# Patient Record
Sex: Male | Born: 1935
Health system: Southern US, Community
[De-identification: ages and names within clinical notes are randomized; demographics above are authoritative.]

## PROBLEM LIST (undated history)

## (undated) DIAGNOSIS — R059 Cough, unspecified: Secondary | ICD-10-CM

## (undated) DIAGNOSIS — R05 Cough: Secondary | ICD-10-CM

## (undated) DIAGNOSIS — I219 Acute myocardial infarction, unspecified: Secondary | ICD-10-CM

## (undated) DIAGNOSIS — E785 Hyperlipidemia, unspecified: Secondary | ICD-10-CM

## (undated) DIAGNOSIS — E039 Hypothyroidism, unspecified: Secondary | ICD-10-CM

## (undated) DIAGNOSIS — K219 Gastro-esophageal reflux disease without esophagitis: Secondary | ICD-10-CM

## (undated) DIAGNOSIS — I480 Paroxysmal atrial fibrillation: Secondary | ICD-10-CM

## (undated) DIAGNOSIS — Z9889 Other specified postprocedural states: Secondary | ICD-10-CM

## (undated) DIAGNOSIS — Z87442 Personal history of urinary calculi: Secondary | ICD-10-CM

## (undated) DIAGNOSIS — N400 Enlarged prostate without lower urinary tract symptoms: Secondary | ICD-10-CM

## (undated) DIAGNOSIS — Z8781 Personal history of (healed) traumatic fracture: Secondary | ICD-10-CM

## (undated) DIAGNOSIS — C679 Malignant neoplasm of bladder, unspecified: Secondary | ICD-10-CM

## (undated) DIAGNOSIS — C61 Malignant neoplasm of prostate: Secondary | ICD-10-CM

## (undated) DIAGNOSIS — G4733 Obstructive sleep apnea (adult) (pediatric): Secondary | ICD-10-CM

## (undated) DIAGNOSIS — I252 Old myocardial infarction: Secondary | ICD-10-CM

## (undated) DIAGNOSIS — I2510 Atherosclerotic heart disease of native coronary artery without angina pectoris: Secondary | ICD-10-CM

## (undated) DIAGNOSIS — B029 Zoster without complications: Secondary | ICD-10-CM

## (undated) DIAGNOSIS — C649 Malignant neoplasm of unspecified kidney, except renal pelvis: Secondary | ICD-10-CM

## (undated) DIAGNOSIS — M109 Gout, unspecified: Secondary | ICD-10-CM

## (undated) DIAGNOSIS — I10 Essential (primary) hypertension: Secondary | ICD-10-CM

## (undated) DIAGNOSIS — Z9989 Dependence on other enabling machines and devices: Secondary | ICD-10-CM

## (undated) DIAGNOSIS — Z973 Presence of spectacles and contact lenses: Secondary | ICD-10-CM

## (undated) HISTORY — DX: Malignant neoplasm of unspecified kidney, except renal pelvis: C64.9

## (undated) HISTORY — PX: WISDOM TOOTH EXTRACTION: SHX21

## (undated) HISTORY — DX: Gilbert syndrome: E80.4

## (undated) HISTORY — PX: OTHER SURGICAL HISTORY: SHX169

## (undated) HISTORY — DX: Paroxysmal atrial fibrillation: I48.0

---

## 2000-02-09 DIAGNOSIS — Z8781 Personal history of (healed) traumatic fracture: Secondary | ICD-10-CM

## 2000-02-09 HISTORY — DX: Personal history of (healed) traumatic fracture: Z87.81

## 2000-09-20 ENCOUNTER — Encounter (INDEPENDENT_AMBULATORY_CARE_PROVIDER_SITE_OTHER): Payer: Self-pay | Admitting: *Deleted

## 2000-09-20 ENCOUNTER — Encounter: Payer: Self-pay | Admitting: Urology

## 2000-09-20 ENCOUNTER — Ambulatory Visit (HOSPITAL_COMMUNITY): Admission: RE | Admit: 2000-09-20 | Discharge: 2000-09-20 | Payer: Self-pay | Admitting: Urology

## 2003-02-09 HISTORY — PX: OTHER SURGICAL HISTORY: SHX169

## 2003-06-10 ENCOUNTER — Inpatient Hospital Stay (HOSPITAL_COMMUNITY): Admission: EM | Admit: 2003-06-10 | Discharge: 2003-06-13 | Payer: Self-pay | Admitting: Emergency Medicine

## 2003-09-03 ENCOUNTER — Ambulatory Visit (HOSPITAL_COMMUNITY): Admission: RE | Admit: 2003-09-03 | Discharge: 2003-09-03 | Payer: Self-pay | Admitting: Gastroenterology

## 2005-09-02 ENCOUNTER — Ambulatory Visit: Payer: Self-pay | Admitting: Internal Medicine

## 2005-10-18 ENCOUNTER — Ambulatory Visit (HOSPITAL_BASED_OUTPATIENT_CLINIC_OR_DEPARTMENT_OTHER): Admission: RE | Admit: 2005-10-18 | Discharge: 2005-10-18 | Payer: Self-pay | Admitting: Internal Medicine

## 2005-10-24 ENCOUNTER — Ambulatory Visit: Payer: Self-pay | Admitting: Internal Medicine

## 2005-10-25 ENCOUNTER — Ambulatory Visit: Payer: Self-pay | Admitting: Internal Medicine

## 2005-11-26 ENCOUNTER — Ambulatory Visit: Payer: Self-pay | Admitting: Internal Medicine

## 2006-01-07 ENCOUNTER — Ambulatory Visit: Payer: Self-pay | Admitting: Internal Medicine

## 2006-01-19 ENCOUNTER — Emergency Department (HOSPITAL_COMMUNITY): Admission: EM | Admit: 2006-01-19 | Discharge: 2006-01-19 | Payer: Self-pay | Admitting: Emergency Medicine

## 2006-07-06 ENCOUNTER — Ambulatory Visit: Payer: Self-pay | Admitting: Internal Medicine

## 2008-04-29 ENCOUNTER — Encounter: Admission: RE | Admit: 2008-04-29 | Discharge: 2008-04-29 | Payer: Self-pay | Admitting: Urology

## 2008-05-01 ENCOUNTER — Ambulatory Visit (HOSPITAL_BASED_OUTPATIENT_CLINIC_OR_DEPARTMENT_OTHER): Admission: RE | Admit: 2008-05-01 | Discharge: 2008-05-01 | Payer: Self-pay | Admitting: Urology

## 2008-05-01 ENCOUNTER — Encounter (INDEPENDENT_AMBULATORY_CARE_PROVIDER_SITE_OTHER): Payer: Self-pay | Admitting: Urology

## 2008-05-15 ENCOUNTER — Encounter (INDEPENDENT_AMBULATORY_CARE_PROVIDER_SITE_OTHER): Payer: Self-pay | Admitting: Urology

## 2008-05-15 ENCOUNTER — Ambulatory Visit (HOSPITAL_COMMUNITY): Admission: RE | Admit: 2008-05-15 | Discharge: 2008-05-15 | Payer: Self-pay | Admitting: Urology

## 2009-03-31 ENCOUNTER — Encounter: Admission: RE | Admit: 2009-03-31 | Discharge: 2009-03-31 | Payer: Self-pay | Admitting: Family Medicine

## 2010-05-20 LAB — BASIC METABOLIC PANEL
CO2: 19 mEq/L (ref 19–32)
GFR calc non Af Amer: >60 mL/min (ref >60)
Glucose, Bld: 116 mg/dL — ABNORMAL HIGH (ref 70–99)
Sodium: 137 mEq/L (ref 135–145)

## 2010-05-20 LAB — HEMOGLOBIN AND HEMATOCRIT, BLOOD: Hemoglobin: 15.9 g/dL (ref 13.0–17.0)

## 2010-05-21 LAB — BASIC METABOLIC PANEL
BUN: 14 mg/dL (ref 6–23)
CO2: 28 mEq/L (ref 19–32)
Chloride: 102 mEq/L (ref 96–112)
Glucose, Bld: 114 mg/dL — ABNORMAL HIGH (ref 70–99)

## 2010-05-21 LAB — URINALYSIS, ROUTINE W REFLEX MICROSCOPIC
Leukocytes, UA: NEGATIVE
Specific Gravity, Urine: 1.017 (ref 1.005–1.030)

## 2010-05-21 LAB — CBC
HCT: 49 % (ref 39.0–52.0)
MCV: 89.7 fL (ref 78.0–100.0)
Platelets: 243 10*3/uL (ref 150–400)
RDW: 13.1 % (ref 11.5–15.5)
WBC: 7.8 10*3/uL (ref 4.0–10.5)

## 2010-06-23 NOTE — Assessment & Plan Note (Signed)
[facility]                             PULMONARY OFFICE NOTE   Charles Wright, Charles Wright                         MRN:          [id number]  DATE:07/06/2006                            DOB:          1935/03/31    PROBLEM:  Obstructive sleep apnea.   HISTORY:  He has decided that it is time to quit CPAP. He feels he has  given it a good try and the problem remains that he really just cannot  tolerate anything on his face. Pressure adjustments have not helped. We  discussed treatment options again. He is not willing to consider  surgery, but is willing to look into an oral appliance, and I talked  about how those are used in this circumstance.   MEDICATIONS:  1. CPAP most recently 13 CWP.  2. EpiPen for p.r.n. use.   ALLERGIES:  No medication allergy.   OBJECTIVE:  Weight 213 pounds, blood pressure 136/88, pulse 67, room air  saturation 96%. He is overweight and expresses intention to lose weight  as part of his sleep apnea therapy. He is alert. There are no pressure  marks on his face from the mask and no evident nasal obstruction, no  strider. Lungs are clear. Heart sounds are regular and normal. There is  no tremor.   IMPRESSION:  Obstructive sleep apnea. He has failed to tolerate CPAP and  is not prepared to keep working with this modality for now.   PLAN:  1. Discontinue CPAP.  2. Emphasis on weight loss.  3. Try conservative measures including chin strap.  4. Offer referral for evaluation of oral appliance.  5. Schedule return to me in one year, earlier p.r.n.     Clinton D. Maple Hudson, MD, [facility], FACP  Electronically Signed    CDY/MedQ  DD: 07/10/2006  DT: 07/11/2006  Job #: [id number]   cc:   [name], M.D.

## 2010-06-23 NOTE — Op Note (Signed)
NAMEAXTON, CIHLAR NO.:  [id number]   MEDICAL RECORD NO.:  [id number]          PATIENT TYPE:  AMB   LOCATION:  DAY                          FACILITY:  [facility]   PHYSICIAN:  Courtney Paris, M.D.DATE OF BIRTH:  [birth year]   DATE OF PROCEDURE:  05/15/2008  DATE OF DISCHARGE:                               OPERATIVE REPORT   PREOPERATIVE DIAGNOSES:  Left ureteral stricture, microscopic hematuria,  atypical cytology.   POSTOPERATIVE DIAGNOSES:  Left ureteral stricture, microscopic  hematuria, atypical cytology, bladder lesion.   PROCEDURE:  Cystoscopy, right retrograde pyelogram, multiple bladder  biopsies, left ureteral stricture dilation and ureteroscopy with  insertion of left ureteral stent.   ANESTHESIA:  General.   SURGEON:  Courtney Paris, M.D.   BRIEF HISTORY:  This 75 year old patient was admitted with findings of a  left distal ureteral stricture that was very subtle.  He had  ureteroscopy 2 weeks ago, but I could not get through the stricture.  He  had suspicious cytology for hematuria recently.  He also had some recent  left lower abdominal pain.  He had some tiny stones in his left kidney  and one in the bladder when I did the ureteroscopy 2 weeks ago.  CT done  May 06, 2008 showed no periureteral inflammation on the left and no  hydronephrosis.  He is admitted now for right retrograde pyelogram and  attempt at ureteroscopy on the left after an indwelling ureteral stent  for 2 weeks.  He had a negative cysto September 2009 as well.   DESCRIPTION OF PROCEDURE:  The patient was placed on the operating room  table in the dorsal lithotomy position after satisfactory induction of  general endotracheal anesthesia.  Time-out was then performed and the  patient and the procedure were then reconfirmed.  He had Cipro running  IV.  The panendoscope was passed under direct vision.  Anterior urethra  was normal, a little trilobar hyperplasia and  the bladder was entered.  Bladder was carefully inspected.  There were a couple of small areas of  mild inflammation and hyperemia which were photographed.  He had a  little squamous metaplasia in front of the right ureteral orifice which  was also photographed.  A small stone was present in the bladder as well  which was also photographed.  A 6 open-ended ureteral catheter was then  inserted in the right ureteral orifice and inclusive retrograde  demonstrated normal findings.  The ureter had no filling defects and the  kidney looked normal as well.  He did not have a corresponding area of  narrowing over the vessels on the right side as he did on the stricture  on the left.   The areas of the bladder were then biopsied.  There were four; the  posterior base and then one on the left lateral wall.  Each of these  were fulgurated with the Bugbee electrode to effect good hemostasis.  Next the end of the ureteral stent was grasped with forceps and pulled  outside the urethral meatus.  Through this a guidewire was  then passed  under fluoroscopy and the stent was then removed.  I tried to pass the 6  short ureteroscope alongside the guidewire up to the stricture, but the  visualization was not good.  We switched out the scope and the camera,  but neither one showed very clear visualization.  I felt that this was  necessary to see this accurately, so I had the digital flexible  ureteroscope brought into the room and a second sensor guidewire was  then passed up the left orifice.  I dilated the left orifice with the  inner cannula for the ureteral access sheath for the digital scope up to  the level of the stricture.  When I passed the digital scope over the  guidewire the sensor guidewire was unable to get up past the stricture.  I took off the scope and then passed over the sensor guidewire a UroMax  balloon dilator 15 cm.  This was passed up to the stricture and then  through the stricture.   This was inflated to 12 atmospheres for 5 timed  minutes.  This was done under fluoroscopy.  The ureteral balloon dilator  was then removed and again I could not pass the flexible scope through  the stricture, so I tried again with the short ureteroscope.  With this  I was able to get through the stricture, but again visualization was not  good after the dilation and the multiple instrumentations and wires up  the ureter, but I was able to get more than halfway through the  stricture and it looked benign from what I could see.  I pulled out the  scope and then over the remaining guidewire placed a 7-French x 24 cm  length double-J ureteral stent, removed the guidewire and the coil in  the renal pelvis and the bladder where in good position.  Bladder was  then drained, B and O suppository inserted, given intraurethral  Xylocaine anesthesia and 30 mg of Toradol IV.  He was taken to the  recovery room in good condition.   The plan is to leave the stent for a week, remove it and await the  bladder biopsies.      Courtney Paris, M.D.  Electronically Signed     HMK/MEDQ  D:  05/15/2008  T:  05/15/2008  Job:  [id number]

## 2010-06-23 NOTE — Op Note (Signed)
NAMERUFINO, STAUP NO.:  [id number]   MEDICAL RECORD NO.:  [id number]          PATIENT TYPE:  AMB   LOCATION:  [facility]                         FACILITY:  [facility]   PHYSICIAN:  Houston M. Kimbrough, M.D.DATE OF BIRTH:  09-Feb-1935   DATE OF PROCEDURE:  DATE OF DISCHARGE:                               OPERATIVE REPORT   PREOPERATIVE DIAGNOSIS:  Hematuria, atypical cytology, left lower  quadrant pain.   POSTOPERATIVE DIAGNOSIS:  Hematuria, atypical cytology, left lower  quadrant pain, plus possible stone in the bladder, left distal ureteral  stricture.   PROCEDURE:  Cystoscopy, left retrograde pyelogram, ureteroscopy and  brush biopsy, insertion of left ureteral stent, fluoroscopy.   SURGEON:  Courtney Paris, M.D.   ANESTHESIA:  General.   BRIEF HISTORY:  This 75 year old patient is admitted for persistent  microscopic hematuria and atypical cytology for cysto and retrogrades.  He had a normal cysto, September2009, because of atypical cytology.  This was normal.  PSA is 1.5.  He has had 1-2 weeks of left-groin pain.  CT without contrast on April 29, 2008 showed some fullness of the left  distal ureter but no hydronephrosis, no stones.  He had a tiny left mid-  renal calculus that was nonobstructing.  There was no periureteral  inflammation noted.  Patient is admitted now for cystoscopy and  retrogrades and possible biopsies, if indicated.   The patient was placed on the operating table and after satisfactory  induction of general anesthesia, was prepped and draped with Betadine in  the usual sterile fashion.  He was given IV Cipro.  Time-out was then  performed and the patient and procedure were then re-confirmed.   The anterior urethra was inspected under direct vision.  This was  normal.  The posterior urethra showed somewhat friable prostate,  trilobar hyperplasia, and the bladder was entered.  There were no  bladder mucosal lesions, but it looked  like there was a tiny stone  floating in the bladder.  I tried to grab it with the grasping forceps  but was unable to do so.  No other lesions were seen.  The left orifice  was catheterized with a 6 open-ended ureteral catheter and an occlusive  retrograde was then performed.  This demonstrated no hydronephrosis, but  a somewhat ectatic and dilated ureter to just below the pelvic brim.  This was about the area where the middle and lower third of the ureter  would join.  It was an area that looked somewhat narrowed and never did  distend with dye.  Several pictures were made.   Under fluoroscopy, I passed a sensor guidewire and it met a little bit  of resistance but did go up to the kidney.  I removed the open-ended  ureteral catheter and the scope.  I then used the inner cannula of the  ureteral access sheath to gently dilate the distal left ureter under  fluoroscopy.  Leaving the guidewire in place, I used the 6 short  ureteroscope and passed this up to this area, but could not get through  it.  It seemed to be tight.  I took pictures of the stricture.  There  was nothing particularly remarkable about the mucosa except it was a  little bit friable.  Under direct vision, using the ureteroscope, I then  passed a brush and brushed this area and sent the brush for cytology.   I removed the ureteroscope and then back-loaded the wire on the  cystoscope and then passed a 6-French x 24 cm double-J catheter on the  left.  When the guidewire was removed, there was a nice coil in the  renal pelvis, one in the bladder.  This was adjusted slightly with  grasping forceps.  His prostate was a little oozy.  There were a few  small blood clots and, when I irrigated these out, it was seen to be a  small, black, hard specimen that was remaining.  It was certainly gritty  and I sent it to see if it was a possible stone.  Scope was removed.  A  B and O suppository was given, as well as some Toradol, and he  was taken  to the recovery room in good condition.   PLAN:  The plan is to leave the indwelling ureteral stent for least a  week and then go back with perhaps a flexible ureteroscope to see if  this area can be further visualized and/or biopsied.      Courtney Paris, M.D.  Electronically Signed     HMK/MEDQ  D:  05/01/2008  T:  05/01/2008  Job:  [id number]

## 2010-06-26 NOTE — Assessment & Plan Note (Signed)
[name]                               PULMONARY OFFICE NOTE   Charles, Wright                         MRN:          [id number]  DATE:09/02/2005                            DOB:          06-19-1935    PROBLEM:  Sleep medicine consultation at the kind request of Dr. Kirby Funk for evaluation of suspected obstructive sleep apnea.   HISTORY:  Charles Wright feels he is here primarily at the instigation of his  wife.  She has told him that he stops breathing and snores loudly.  He  drives a lot saying that he will stop anytime he needs to for a quick  restorative nap, that he can fall asleep anytime, but he does not regard  this is as a problem and does not feel that it gets in his way.  His usual  bedtime is between 11 and 12 a.m., estimating 5 minutes sleep latency and  awakening once during the night before final waking around 6 a.m.  He has  not been using sleep medications.   REVIEW OF SYSTEMS:  Snoring with witnessed apneas.  Sleeping in the daytime  only if he sits quietly.  He does take naps when needed.  No nocturnal  choking or strangling.  Occasional mild nonspecific nasal congestion.  He  has found that it helps to wear a Breathe-Rite strip and to use a saline  nose spray.  Weight is very stable.  He denies any awareness of chest pain,  palpitations, leg jerks, or unusual sleep associated behaviors, otherwise.   MEDICATIONS:  Tetracycline used p.r.n. for rosacea, Sulindac.   ALLERGIES:  No medication allergies.   PAST HISTORY:  No history of cardiopulmonary disease.  He had a fractured  nose playing high school football which was not formally straightened.  He  broke his leg in an awkward fall and he also had a compression fracture of  his lumbar spine.  His tonsils were taken out in early childhood.  No  history of thyroid disease, central nervous system disease, or other  significant health problems.   SOCIAL HISTORY:  Two glasses  of red wine per day, two mugs of coffee in the  morning, sometimes additional coffee when driving.  He is married and works  as a Hospital doctor of a Librarian, academic.   FAMILY HISTORY:  Nobody with known sleep problems.  Father died of heart  disease.  Mother died of leukemia.   PHYSICAL EXAMINATION:  VITAL SIGNS:  Weight 208 pounds, blood pressure 132/74, pulse regular at 73,  room air saturation 96%.  GENERAL:  This is a sturdy alert man, a little overweight (he is trying to  diet and exercise to bring that down).  HEENT:  Speech quality is normal.  Nasopharynx is clear with posterior  pharynx minimally glandular, no drainage seen.  His nasal airway is not  obstructed.  LUNGS:  Clear to P&A.  HEART:  Sounds are regular without murmur or gallop.  There is no restlessness or unusual behavior.   IMPRESSION:  Probable obstructive sleep apnea with hypersomnia.   PLAN:  1.  We are scheduling a split night protocol sleep study.  2.  We have discussed the physiology and medical concerns associated with      sleep apnea, his responsibility to drive safely, and advised to try to      lose a few more pounds if possible.   I appreciate the chance to meet him.                                   Clinton D. Maple Hudson, MD, [facility], FACP   CDY/MedQ  DD:  09/02/2005  DT:  09/02/2005  Job #:  [id number]   cc:   [name], MD  [facility]

## 2010-06-26 NOTE — Procedures (Signed)
NAMEFORREST, JAROSZEWSKI NO.:  [id number]   MEDICAL RECORD NO.:  [id number]          PATIENT TYPE:  OUT   LOCATION:  SLEEP CENTER                 FACILITY:  [facility]   PHYSICIAN:  Clinton D. Maple Hudson, MD, FCCP, FACPDATE OF BIRTH:  03-06-1935   DATE OF STUDY:  10/18/2005                              NOCTURNAL POLYSOMNOGRAM   REFERRING PHYSICIAN:  Dr. Jetty Duhamel   INDICATIONS FOR STUDY:  Hypersomnia with sleep apnea.   EPWORTH SLEEPINESS SCORE:  6/24.   BMI 30, weight 200 pounds.   HOME MEDICATIONS:  Tetracycline/Sulindac.   SLEEP ARCHITECTURE:  Total sleep time 316 minutes with sleep efficiency 76%.  Stage I was 20%, stage II 68%, stages III and IV were absent.  REM 12% of  total sleep time.  Sleep latency 25 minutes.  REM latency 68 minutes.  Awake  after sleep onset 73 minutes.  Arousal index 29.2.  No bedtime medication  was taken.   RESPIRATORY DATA:  Split study protocol.  Apnea/hypopnea index (AHI, RDI)  15.6 obstructive events per hour indicating mild to moderate obstructive  sleep apnea/hypopnea syndrome before CPAP.  This included 10 obstructive  apneas and 27 hypopneas before CPAP.  The events were not positional.  REM  AHI 4.9 per hour.  CPAP was titrated to 13 CWP, AHI 5.3 per hour.  Several  masks were tried, and the patient was never entirely comfortable with any of  those used.  He settled on a medium ResMed Quattro full-face mask for this  study, with a heated humidifier.   OXYGEN DATA:  Mild to moderate snoring with oxygen desaturation to a nadir  of 83%.  Mean oxygen saturation with CPAP control was 92% on room air.   CARDIAC DATA:  Sinus rhythm with occasional PAC.   MOVEMENT/PARASOMNIA:  A total of 154 limb jerks were recorded, of which 44  were associated with arousal or awakening, for a periodic limb movement with  arousal index of 8.3 per hour, which is increased.   IMPRESSION/RECOMMENDATION:  1. Mild to moderate obstructive sleep  apnea/hypopnea syndrome, AHI 15.6      per hour with moderate snoring and oxygen desaturation to 83%.  2. Successful CPAP titration to 13 CWP, AHI 5.3 per hour.  A medium ResMed      Quattro full-face mask was used for this study with a heated      humidifier.  He indicated he was not entirely satisfied with that mask,      and it is likely that his home care company will have to work with him      on alternatives.  3. Periodic limb movement with arousal, 8.3 per hour, which may need      reconsideration after he has adjusted to CPAP.      Clinton D. Maple Hudson, MD, [facility], FACP  Diplomate, Biomedical engineer of Sleep Medicine  Electronically Signed     CDY/MEDQ  D:  10/24/2005 13:05:44  T:  10/25/2005 12:03:40  Job:  [id number]

## 2010-06-26 NOTE — Discharge Summary (Signed)
NAMEMarland Wright  BRONSYN, SHAPPELL NO.:  [id number]   MEDICAL RECORD NO.:  [id number]                   PATIENT TYPE:  INP   LOCATION:  0453                                 [facility]ACILITY:  [facility]   PHYSICIAN:  Myrtie Neither, M.D.                 DATE OF BIRTH:  1935-11-27   DATE OF ADMISSION:  06/10/2003  DATE OF DISCHARGE:  06/13/2003                                 DISCHARGE SUMMARY   ADMITTING DIAGNOSIS:  Fracture dislocation left ankle with fracture of the  lateral malleolus and complete rupture of the deltoid ligament.   DISCHARGE DIAGNOSIS:  Fracture dislocation left ankle with fracture of the  lateral malleolus and complete rupture of the deltoid ligament.   COMPLICATIONS:  None.   INFECTIONS:  None.   OPERATIONS:  1. Open reduction internal fixation lateral malleolus.  2. Repair of deltoid ligament with application of short-leg cast.   PERTINENT HISTORY:  This is a 75 year old male who was at work and  accidentally slipped down an embankment on some slippery grass and developed  severe pain, swelling, and deformity of his left ankle.  The patient came to  [facility] emergency room for treatment.  The patient denies any other  injuries.   PAST MEDICAL HISTORY:  Bilateral inguinal hernia repair.  No history of high  blood pressure or diabetes.   ALLERGIES:  None known.   MEDICATIONS:  None.   HABITS AND SOCIAL HISTORY:  The patient lives with his wife.  Drinks wine at  bedtime and during the day.  No use of cigarettes or illegal drugs.   FAMILY HISTORY:  Noncontributory.   REVIEW OF SYSTEMS:  Basically, the patient being in good health.  No cardiac  or respiratory, no urinary or bowel symptoms.   PERTINENT PHYSICAL EXAMINATION:  The left ankle laterally subluxed, totally  unstable, tender, swollen.  Mild ecchymosis over the medial aspect of the  ankle joint where the deltoid is.  Palpable defect at the deltoid ligament  area.  Pressure over the  skin of the medial malleolus from the subluxed  ankle joint.  X-rays revealed fracture dislocation left ankle with complete  disruption of the deltoid ligament.   IMPRESSION:  Fracture dislocation left ankle with disruption deltoid  ligament.   PLAN:  Open reduction internal fixation left ankle and repair of deltoid  ligament with application of short-leg cast.   HOSPITAL COURSE:  The patient underwent ORIF of his ankle and repair of the  deltoid ligament, application of short-leg cast.  He tolerated the procedure  quite well, started on ice packs, elevation, PCA pain medication, and  Percocet also orally.  The patient remained afebrile, tolerated physical  therapy quite well, and was able to be stable enough to be discharged.  Discharged on Percocet one to two p.o. q.4h. p.r.n. for pain, Clinoril 200  mg b.i.d., Skelaxin 800 mg b.i.d., and  to return to the office in 2 weeks.  The patient was discharged in stable and satisfactory condition.                                               Myrtie Neither, M.D.    AC/MEDQ  D:  06/18/2003  T:  06/18/2003  Job:  [id number]   cc:   [name], M.D.  [address]  [place]  [place] [address]  Fax: [phone]

## 2010-06-26 NOTE — Assessment & Plan Note (Signed)
[facility]                             PULMONARY OFFICE NOTE   REA, RESER                         MRN:          [id number]  DATE:01/07/2006                            DOB:          [birth year]    PROBLEM:  Obstructive sleep apnea.   HISTORY:  He continues struggling to make himself comfortable with CPAP.  We reduced the pressure from 14 to 12 CWP, and he says this is better  but does not feel further reduction is appropriate at this point after  our discussion.  He is trying to keep it on for 2 to 3 hours at night if  he can.  We reviewed factors associated with management of sleep apnea,  and recognize his gradual weight drift upward.  He plans to lose weight.  He is using Breathe-Right strips, and we discussed oral appliances, chin  straps, etc.  CPAP is currently set at 12.   OBJECTIVE:  Weight is up to 215 pounds, BP 122/82, pulse regular 68,  room air saturation 98%.  He is quite alert.  There are no pressure marks on his face from the mask and no evident  nasal congestion.  Voice quality is normal.  Breathing is unlabored.  Pulse regular.   IMPRESSION:  Obstructive sleep apnea, just continuing to struggle with  continuous positive airway pressure toleration.   PLAN:  1. Continue efforts with CPAP, but he is offered referral for oral      appliance.  He is going to try an over-the-counter chin strap and      work on his weight.  2. Schedule return in 6 months, earlier p.r.n.     Clinton D. Maple Hudson, MD, [facility], FACP  Electronically Signed    CDY/MedQ  DD: 01/07/2006  DT: 01/08/2006  Job #: [id number]   cc:   [name], M.D.

## 2010-06-26 NOTE — H&P (Signed)
NAMEMarland Kitchen  ZAID, TOMES NO.:  [id number]   MEDICAL RECORD NO.:  [id number]                   PATIENT TYPE:  INP   LOCATION:  0453                                 FACILITY:  [facility]   PHYSICIAN:  Myrtie Neither, M.D.                 DATE OF BIRTH:  [birth year]   DATE OF ADMISSION:  06/10/2003  DATE OF DISCHARGE:                                HISTORY & PHYSICAL   CHIEF COMPLAINT:  Painful deformed left ankle.   HISTORY OF PRESENT ILLNESS:  This is a 75 year old who states that he fell  down an embankment on some slippery grass this morning and developed severe  pain, swelling, and deformity of the left ankle. The patient came to [facility] emergency room for treatment. The patient denies any other injury or  loss of consciousness.   PAST MEDICAL HISTORY:  No history of high blood pressure or diabetes.   PAST SURGICAL HISTORY:  Bilateral hernia repair.   HABITS:  The patient takes a sip of wine at night.  No history of smoking.  The patient lives with his wife.   ALLERGIES:  None known.   MEDICATIONS:  None.   FAMILY HISTORY:  Noncontributory.   REVIEW OF SYSTEMS:  Some history of reflux. No shortness of breath. No chest  pains.  No urinary or bowel symptoms.   PHYSICAL EXAMINATION:  GENERAL: Alert and oriented, in no acute distress.  VITAL SIGNS: Temperature 97, blood pressure 145/93, pulse 81, respirations  20.  HEENT: Head normocephalic. Eyes show TMs and sclerae clear.  NECK: Supple.  CHEST: Clear.  CARDIAC: S1 and S2.  EXTREMITIES: Left ankle with gross deformity, swelling diffusely, ecchymosis  over the medial aspect of the ankle joint with obvious unstable ankle joint.  Dorsalis pedis intact. Nail beds pink and blanches quite well. There is a  pressure area over the medial aspect of the ankle joint from the bone  pressing against the inside of the skin.   X-rays reveal fracture, subluxation of the left ankle with obvious  destruction  of the deltoid ligament and fracture of the lateral malleolus.   PLAN:  Open reduction, internal fixation lateral malleolus and repair of  deltoid ligament.                                              Myrtie Neither, M.D.   AC/MEDQ  D:  06/10/2003  T:  06/10/2003  Job:  [id number]

## 2010-06-26 NOTE — Assessment & Plan Note (Signed)
[facility]                               PULMONARY OFFICE NOTE   Charles Wright, Charles Wright                         MRN:          [id number]  DATE:11/26/2005                            DOB:          Aug 12, 1935    PROBLEM:  Obstructive sleep apnea.   HISTORY:  Charles Wright continues to struggle with CPAP, trying to get  comfortable with it.  Pressure seems too high set at 14 CWP with C-FLEX.  He  has tried multiple masks and thinks if there is one problem, it is that the  pressure is too high.  If he takes the mask off, his wife wakes because of  his snoring, but she is using a white noise device.  He had hurt his leg a  while back and has noted a little contralateral edema which he is going to  track with Dr. Valentina Lucks.  I have recommended elevation at a minimum.   MEDICATIONS:  CPAP at 14 CWP.   ALLERGIES:  No known drug allergies.   OBJECTIVE:  VITAL SIGNS:  Weight 212 pounds, blood pressure 130/82, pulse  regular at 72, room air saturation 95%.  He is still somewhat heavy.  GENERAL:  He is alert with no pressure marks on his face.  There is trace  edema in the right lower leg without obvious Homan's.   IMPRESSION:  Obstructive sleep apnea with difficulty adjusting to CPAP.  Leg  edema to follow up with Dr. Valentina Lucks.   PLAN:  [facility] is to reduce CPAP to 12 CWP with comfort measures  reviewed, weight loss emphasized, alternatives reviewed.  Schedule return in  6 weeks, earlier p.r.n.       Clinton D. Maple Hudson, MD, [facility], FACP      CDY/MedQ  DD:  11/26/2005  DT:  11/29/2005  Job #:  [id number]   cc:   [name], M.D.

## 2010-06-26 NOTE — Op Note (Signed)
NAMEMarland Kitchen  BENJIMIN, HADDEN NO.:  [id number]   MEDICAL RECORD NO.:  [id number]                   PATIENT TYPE:  INP   LOCATION:  0453                                 FACILITY:  [facility]   PHYSICIAN:  Myrtie Neither, M.D.                 DATE OF BIRTH:  [birth year]   DATE OF PROCEDURE:  06/10/2003  DATE OF DISCHARGE:                                 OPERATIVE REPORT   PREOPERATIVE DIAGNOSIS:  Fractured lateral malleolus with rupture of the  deltoid ligament and subluxation of the ankle joint.   POSTOPERATIVE DIAGNOSIS:  Fractured lateral malleolus with rupture of the  deltoid ligament and subluxation of the ankle joint.   ANESTHESIA:  General.   PROCEDURES:  1. Open reduction, internal fixation lateral malleolus.  2. Repair of deltoid ligament, left ankle.   The patient was taken to the operating room. After given adequate  preoperative medication, given general anesthesia, and intubated, the left  lower leg was prepped with Duraprep and draped in a sterile manner. A  tourniquet and Bovie were used for hemostasis. C-arm was used to visualize  fracture reduction. A medial Kocher incision was made over the medial aspect  of the ankle, going through the skin, subcutaneous tissue, down through the  hematoma, and down to the location of the completely disrupted deltoid  ligaments and veins. A #2 wire suture was placed into the deltoid ligament.  The area was irrigated and the joint was irrigated with saline solution.  Sutures were placed and clamped with hemostats.  Next, a lateral incision  was made over the lateral malleolus, going through the skin and subcutaneous  tissue down to the fracture site. Irrigation was done. Fracture reduction  was done.  A compression plate was placed across the fracture site holding  the fracture in anatomic position. Four screws were placed across the  fracture site holding it in stable position. Wound closure was then done  with  2-0 Vicryl for the subcutaneous and skin staples to the skin. Next,  attention was turned back to the medial aspect of the ankle joint and the  sutures of the deltoid were then tied and cut.  Irrigation was done. The  ankle itself was good and stable. Then 2-0 Vicryl was used to the  subcutaneous tissue and skin staples for the skin. A compressive bulky  dressing was applied and a short-leg fiberglass cast was applied. The  patient tolerated the procedure quite well and went to the recovery room in  stable and satisfactory condition.                                               Myrtie Neither, M.D.    AC/MEDQ  D:  06/10/2003  T:  06/10/2003  Job:  [id number]

## 2010-06-26 NOTE — Assessment & Plan Note (Signed)
[facility]                               PULMONARY OFFICE NOTE   JOSIMAR, CORNING                         MRN:          [id number]  DATE:10/25/2005                            DOB:          1935/07/13    PROBLEM:  Obstructive sleep apnea.   HISTORY:  [name] returns after his sleep study done October 18, 2005.  This showed moderate obstructive sleep apnea with index of 15.6 obstructive  events per hour, desaturating to 83% with mild to moderate snoring. He had  difficulty getting comfortable with any of the available masks.  CPAP was  titrated to 13 CWP for an index of 5.3 per hour.  Today we discussed comfort  measures, available treatments for sleep apnea and the medical concerns of  this diagnosis.  I emphasized the importance of keeping his weight down and  his responsibility to drive safely, the advantages of sleeping off flat of  back.   OBJECTIVE:  Weight 209 pounds, blood pressure 124/72, pulse regular 59, room  air oxygen saturation 93%.  He is alert and appropriate now.  His nasal  bridge is somewhat prominent which may interfere with fit.  He is a little  heavy.  There is no nasal obstruction.  Lungs are clear.  Heart sounds are  normal.   IMPRESSION:  Obstructive sleep apnea which probably will respond best to  CPAP if we can get him comfortable with it.   PLAN:  1. Emphasis on weight loss.  2. CPAP trial at 13 to 14 CWP.  3. Scheduled to return in one month, earlier p.r.n.                                   Clinton D. Maple Hudson, MD, [facility], FACP   CDY/MedQ  DD:  10/27/2005  DT:  10/29/2005  Job #:  [id number]   cc:   [name], M.D.

## 2010-08-17 ENCOUNTER — Other Ambulatory Visit: Payer: Self-pay | Admitting: Dermatology

## 2011-01-12 ENCOUNTER — Other Ambulatory Visit: Payer: Self-pay | Admitting: Dermatology

## 2011-10-13 ENCOUNTER — Other Ambulatory Visit: Payer: Self-pay | Admitting: Urology

## 2011-10-17 NOTE — H&P (Signed)
History of Present Illness                       F/u h/o nephrolithiasis and h/o ureteral stricture.   He had an atypical cytology on a visit in early March 2010. He was set up for cystoscopy, bilateral retrogrades in May 2010.   He has also had some kidney stones, but none have ever been recovered.  He had his last stone in 06/2005, and his first one was around 2002 when he broke his back. He has 2 small left renal calculi seen on a CT scan done last fall 2009.   CT scan March 2010 was negative for hydronephrosis but a little dilated distal ureter was noted. No ureteral stones were seen and a small stone in his left mid kidney that was nonobstructing. On May 01, 2008 and underwent a left retrograde pyelogram with the finding of a ureteral stricture about 2-3 cm just below the pelvic brim on the left side. There was no hydronephrosis. The ureteroscope was passed up to but not through the stricture. He was stented. He had another ureteroscopy in April 2010 that showed a stricture but it had to be dilated and a scope would not advance all the way through it. Brush biopsy of this area was negative. Then a larger stent was placed. Review of the CT scan did not show any peri-ureteral involvement in this area, but the etiology of the stricture is unknown as of this time. The stent was then removed after a short time and he has done well since May 2010. He had a IVP July 2010 which was really pretty normal and showed no hydronephrosis or abnormality of the left distal ureter.   CT scan January 2011 was negative, no hydronephrosis, and the hyperechoic area of the left kidney was a benign column of Bertin. Cytology was negative July 2010.   Renal ultrasound Aug 2013 - I reviewed all the images, comparison CT January 2011 an ultrasound January 2011, findings: The right kidney is 11.5 cm in length with a 1.8 cm cortical with. There was an irregular shaped cystic area 1.19 cm in the right lower pole which is  stable. There is a 6 mm stone in the right midpole. There was no hydronephrosis or solid mass. The left kidney showed no hydronephrosis and 2 scattered hyperechoic areas possible small stones about 3 mm each. There was a round hypoechoic area 2.5 cm which compared to 2.1 cm in 2011. The bladder and then 65 mL postvoid with a small rounded 1 cm area either a bladder mass or median lobe of prostate.   PSA was 2.65 June 2012. His PSA Jul 2013 is 3.05 (PSAV  0.3 - 0.4 / year).   Interval Hx He returns for CT and cystoscopy to evaluate for microhematuria given complex h/o of ureteral stricture.   Unofficially CT A/P looks normal from a GU point of view.    Past Medical History Problems  1. History of  Abdominal Pain In The Left Lower Belly (LLQ) 789.04 2. History of  Microscopic Hematuria 599.72 3. History of  Nephrolithiasis V13.01 4. History of  Nephrolithiasis Of The Left Kidney V13.01 5. History of  Ureteral Stricture 593.3  Surgical History Problems  1. History of  Cystoscopy With Biopsy 2. History of  Cystoscopy With Insertion Of Ureteral Stent Left 3. History of  Cystoscopy With Insertion Of Ureteral Stent Left 4. History of  Cystoscopy With Ureteral Cath And Brush  Biopsy Left 5. History of  Cystoscopy With Ureteroscopy Left 6. History of  Cystourethroscopy With Treatment Of Ureteral Stricture 7. History of  Leg Repair 8. History of  Oral Surgery  Current Meds 1. EpiPen 0.3 MG/0.3ML Injection Device; Therapy: 12Jun2011 to 2. Ibuprofen 800 MG Oral Tablet; Therapy: 27Apr2012 to 3. Losartan Potassium 100 MG Oral Tablet; Therapy: 27May2011 to 4. Multi-Vitamin/Iron TABS; Therapy: (Recorded:06Jan2011) to 5. Pravastatin Sodium 40 MG Oral Tablet; Therapy: (Recorded:22Mar2010) to 6. Tetracycline HCl CAPS; Therapy: (Recorded:14Jul2011) to  Allergies Medication  1. OxyCODONE HCl TABS  Family History Problems  1. Family history of  Death In The Family Father Deceased at age 66 2.  Family history of  Death In The Family Mother Deceased at age 54  Social History Problems  1. Alcohol Use 2 daily 2. Caffeine Use 2 daily 3. Marital History - Currently Married 4. Never A Smoker 5. Occupation: Psychologist, sport and exercise Denied  6. History of  Tobacco Use V15.82  Review of Systems Constitutional, cardiovascular, pulmonary, musculoskeletal and neurological system(s) were reviewed and pertinent findings if present are noted.    Physical Exam Constitutional: Well nourished and well developed . No acute distress.  Pulmonary: No respiratory distress and normal respiratory rhythm and effort.  Cardiovascular: Heart rate and rhythm are normal . No peripheral edema.  Neuro/Psych:. Mood and affect are appropriate.    Results/Data Urine [Data Includes: Last 1 Day]   03Sep2013  COLOR YELLOW   APPEARANCE CLEAR   SPECIFIC GRAVITY 1.020   pH 6.5   GLUCOSE NEG mg/dL  BILIRUBIN NEG   KETONE NEG mg/dL  BLOOD LARGE   PROTEIN NEG mg/dL  UROBILINOGEN 0.2 mg/dL  NITRITE NEG   LEUKOCYTE ESTERASE NEG   SQUAMOUS EPITHELIAL/HPF NONE SEEN   WBC 0-2 WBC/hpf  RBC 11-20 RBC/hpf  BACTERIA NONE SEEN   CRYSTALS NONE SEEN   CASTS NONE SEEN    Procedure  Procedure: Cystoscopy   Indication: Hematuria.  Informed Consent: Risks, benefits, and potential adverse events were discussed and informed consent was obtained from the patient.  Prep: The patient was prepped with betadine.  Antibiotic prophylaxis: Ciprofloxacin.  Procedure Note:  Urethral meatus:. No abnormalities.  Anterior urethra: No abnormalities.  Prostatic urethra: No abnormalities . The lateral prostatic lobes were enlarged.  Bladder: Visulization was clear. The ureteral orifices were in the normal anatomic position bilaterally and had clear efflux of urine. A sessile tumor was seen in the bladder. This tumor was located on the right side, on the posterior aspect of the bladder. Another papillary tumor was seen in the bladder.  This tumor was located on the left side, near the trigone of the bladder. The patient tolerated the procedure well.  Complications: None.    Assessment Assessed  1. Microscopic Hematuria 599.72 2. 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 Follow-up Schedule Surgery Office  Follow-up  Done: 03Sep2013 Health Maintenance (V70.0)  2. UA With REFLEX  Done: 03Sep2013 02:10PM Microscopic Hematuria (599.72)  3. AU CT-HEMATURIA PROTOCOL  Done: 03Sep2013 12:00AM 4. AU CT-HEMATURIA PROTOCOL  Done: 03Sep2013 12:00AM  Discussion/Summary        I discussed with the patient the cystoscopic findings. We discussed the nature risks and benefits of TURBT, left ureteroscopy, possible ureteral stent on left. We discussed mitomycin-C as well but the lesions dont look papillary. All questions answered. He elects to proceed and we will hold off on mitomycin.    cc: Dr. Elias Else  Signatures Electronically signed by : Jerilee Field, M.D.; Oct 12 2011  4:25PM

## 2011-10-19 ENCOUNTER — Encounter (HOSPITAL_BASED_OUTPATIENT_CLINIC_OR_DEPARTMENT_OTHER): Payer: Self-pay | Admitting: *Deleted

## 2011-10-19 NOTE — Progress Notes (Signed)
NPO AFTER MN. ARRIVES AT 0815. NEEDS ISTAT AND EKG. 

## 2011-10-22 ENCOUNTER — Ambulatory Visit (HOSPITAL_BASED_OUTPATIENT_CLINIC_OR_DEPARTMENT_OTHER): Payer: Medicare Other | Admitting: Anesthesiology

## 2011-10-22 ENCOUNTER — Encounter (HOSPITAL_BASED_OUTPATIENT_CLINIC_OR_DEPARTMENT_OTHER)
Admission: RE | Disposition: A | Discharge status: Home / Self Care | Payer: Self-pay | Source: Ambulatory Visit | Attending: Urology

## 2011-10-22 ENCOUNTER — Encounter (HOSPITAL_BASED_OUTPATIENT_CLINIC_OR_DEPARTMENT_OTHER): Payer: Self-pay

## 2011-10-22 ENCOUNTER — Ambulatory Visit (HOSPITAL_BASED_OUTPATIENT_CLINIC_OR_DEPARTMENT_OTHER)
Admission: RE | Admit: 2011-10-22 | Discharge: 2011-10-22 | Disposition: A | Discharge status: Home / Self Care | Payer: Medicare Other | Source: Ambulatory Visit | Attending: Urology | Admitting: Urology

## 2011-10-22 ENCOUNTER — Encounter (HOSPITAL_BASED_OUTPATIENT_CLINIC_OR_DEPARTMENT_OTHER): Payer: Self-pay | Admitting: Anesthesiology

## 2011-10-22 DIAGNOSIS — C679 Malignant neoplasm of bladder, unspecified: Secondary | ICD-10-CM | POA: Insufficient documentation

## 2011-10-22 DIAGNOSIS — G4730 Sleep apnea, unspecified: Secondary | ICD-10-CM | POA: Insufficient documentation

## 2011-10-22 DIAGNOSIS — N135 Crossing vessel and stricture of ureter without hydronephrosis: Secondary | ICD-10-CM | POA: Insufficient documentation

## 2011-10-22 DIAGNOSIS — C669 Malignant neoplasm of unspecified ureter: Secondary | ICD-10-CM | POA: Insufficient documentation

## 2011-10-22 DIAGNOSIS — Z79899 Other long term (current) drug therapy: Secondary | ICD-10-CM | POA: Insufficient documentation

## 2011-10-22 DIAGNOSIS — I10 Essential (primary) hypertension: Secondary | ICD-10-CM | POA: Insufficient documentation

## 2011-10-22 DIAGNOSIS — Z87442 Personal history of urinary calculi: Secondary | ICD-10-CM | POA: Insufficient documentation

## 2011-10-22 HISTORY — PX: URETEROSCOPY: SHX842

## 2011-10-22 HISTORY — PX: TRANSURETHRAL RESECTION OF BLADDER TUMOR: SHX2575

## 2011-10-22 HISTORY — DX: Hyperlipidemia, unspecified: E78.5

## 2011-10-22 HISTORY — DX: Essential (primary) hypertension: I10

## 2011-10-22 LAB — POCT I-STAT 4, (NA,K, GLUC, HGB,HCT): Glucose, Bld: 107 mg/dL — ABNORMAL HIGH (ref 70–99)

## 2011-10-22 SURGERY — TURBT (TRANSURETHRAL RESECTION OF BLADDER TUMOR)
Anesthesia: General | Site: Ureter | Wound class: Clean Contaminated

## 2011-10-22 MED ORDER — PROMETHAZINE HCL 25 MG/ML IJ SOLN: 6.2500 mg | INTRAMUSCULAR | Status: DC | PRN

## 2011-10-22 MED ORDER — LIDOCAINE HCL 2 % EX GEL
CUTANEOUS | Status: DC | PRN
  Administered 2011-10-22: 1

## 2011-10-22 MED ORDER — STERILE WATER FOR IRRIGATION IR SOLN
Status: DC | PRN
  Administered 2011-10-22: 3000 mL

## 2011-10-22 MED ORDER — CEFAZOLIN SODIUM-DEXTROSE 2-3 GM-% IV SOLR
2.0000 g | INTRAVENOUS | Status: AC
  Administered 2011-10-22: 2 g via INTRAVENOUS

## 2011-10-22 MED ORDER — LACTATED RINGERS IV SOLN: INTRAVENOUS | Status: DC

## 2011-10-22 MED ORDER — FENTANYL CITRATE 0.05 MG/ML IJ SOLN
INTRAMUSCULAR | Status: DC | PRN
  Administered 2011-10-22 (×5): 25 ug via INTRAVENOUS
  Administered 2011-10-22 (×2): 50 ug via INTRAVENOUS
  Administered 2011-10-22 (×5): 25 ug via INTRAVENOUS

## 2011-10-22 MED ORDER — URIBEL 118 MG PO CAPS: 1.0000 | ORAL_CAPSULE | Freq: Four times a day (QID) | ORAL | Status: DC | PRN

## 2011-10-22 MED ORDER — TRAMADOL HCL 50 MG PO TABS
50.0000 mg | ORAL_TABLET | Freq: Four times a day (QID) | ORAL | Status: DC | PRN
  Administered 2011-10-22: 50 mg via ORAL

## 2011-10-22 MED ORDER — LACTATED RINGERS IV SOLN
INTRAVENOUS | Status: DC
  Administered 2011-10-22 (×3): via INTRAVENOUS

## 2011-10-22 MED ORDER — URELLE 81 MG PO TABS
1.0000 | ORAL_TABLET | Freq: Four times a day (QID) | ORAL | Status: DC
  Administered 2011-10-22: 81 mg via ORAL

## 2011-10-22 MED ORDER — TRAMADOL HCL 50 MG PO TABS: 50.0000 mg | ORAL_TABLET | Freq: Four times a day (QID) | ORAL | Status: AC | PRN

## 2011-10-22 MED ORDER — LIDOCAINE HCL (CARDIAC) 20 MG/ML IV SOLN
INTRAVENOUS | Status: DC | PRN
  Administered 2011-10-22: 100 mg via INTRAVENOUS

## 2011-10-22 MED ORDER — DOCUSATE SODIUM 100 MG PO CAPS: 100.0000 mg | ORAL_CAPSULE | Freq: Two times a day (BID) | ORAL | Status: AC

## 2011-10-22 MED ORDER — MEPERIDINE HCL 25 MG/ML IJ SOLN: 6.2500 mg | INTRAMUSCULAR | Status: DC | PRN

## 2011-10-22 MED ORDER — CIPROFLOXACIN HCL 500 MG PO TABS: 500.0000 mg | ORAL_TABLET | Freq: Two times a day (BID) | ORAL | Status: AC

## 2011-10-22 MED ORDER — ONDANSETRON HCL 4 MG/2ML IJ SOLN
INTRAMUSCULAR | Status: DC | PRN
  Administered 2011-10-22: 4 mg via INTRAVENOUS

## 2011-10-22 MED ORDER — CEFAZOLIN SODIUM 1-5 GM-% IV SOLN: 1.0000 g | INTRAVENOUS | Status: DC

## 2011-10-22 MED ORDER — FENTANYL CITRATE 0.05 MG/ML IJ SOLN: 25.0000 ug | INTRAMUSCULAR | Status: DC | PRN

## 2011-10-22 MED ORDER — DEXAMETHASONE SODIUM PHOSPHATE 4 MG/ML IJ SOLN
INTRAMUSCULAR | Status: DC | PRN
  Administered 2011-10-22: 10 mg via INTRAVENOUS

## 2011-10-22 MED ORDER — PROPOFOL 10 MG/ML IV BOLUS
INTRAVENOUS | Status: DC | PRN
  Administered 2011-10-22: 250 mg via INTRAVENOUS

## 2011-10-22 MED ORDER — IOHEXOL 350 MG/ML SOLN
INTRAVENOUS | Status: DC | PRN
  Administered 2011-10-22: 3 mL via INTRAVENOUS

## 2011-10-22 MED ORDER — SODIUM CHLORIDE 0.9 % IR SOLN
Status: DC | PRN
  Administered 2011-10-22: 9000 mL

## 2011-10-22 SURGICAL SUPPLY — 56 items
ADAPTER CATH URET PLST 4-6FR (CATHETERS) ×4 IMPLANT
ADPR CATH URET STRL DISP 4-6FR (CATHETERS) ×2
BAG DRAIN URO-CYSTO SKYTR STRL (DRAIN) ×4 IMPLANT
BAG DRN ANRFLXCHMBR STRAP LEK (BAG) ×3
BAG DRN UROCATH (DRAIN) ×1
BAG URINE DRAINAGE (UROLOGICAL SUPPLIES) IMPLANT
BAG URINE LEG 19OZ MD ST LTX (BAG) ×4 IMPLANT
BASKET LASER NITINOL 1.9FR (BASKET) IMPLANT
BASKET STNLS GEMINI 4WIRE 3FR (BASKET) IMPLANT
BASKET ZERO TIP NITINOL 2.4FR (BASKET) IMPLANT
BRUSH URET BIOPSY 3F (UROLOGICAL SUPPLIES) IMPLANT
BSKT STON RTRVL 120 1.9FR (BASKET)
BSKT STON RTRVL GEM 120X11 3FR (BASKET)
CANISTER SUCT LVC 12 LTR MEDI- (MISCELLANEOUS) ×4 IMPLANT
CATH FOLEY 2WAY SLVR  5CC 18FR (CATHETERS) ×1
CATH FOLEY 2WAY SLVR  5CC 20FR (CATHETERS)
CATH FOLEY 2WAY SLVR  5CC 22FR (CATHETERS)
CATH FOLEY 2WAY SLVR 5CC 18FR (CATHETERS) ×3 IMPLANT
CATH FOLEY 2WAY SLVR 5CC 20FR (CATHETERS) IMPLANT
CATH FOLEY 2WAY SLVR 5CC 22FR (CATHETERS) IMPLANT
CATH INTERMIT  6FR 70CM (CATHETERS) ×4 IMPLANT
CATH URET 5FR 28IN CONE TIP (BALLOONS)
CATH URET 5FR 28IN OPEN ENDED (CATHETERS) IMPLANT
CATH URET 5FR 70CM CONE TIP (BALLOONS) IMPLANT
CLOTH BEACON ORANGE TIMEOUT ST (SAFETY) ×4 IMPLANT
DRAPE CAMERA CLOSED 9X96 (DRAPES) ×4 IMPLANT
DRESSING TELFA 8X3 (GAUZE/BANDAGES/DRESSINGS) IMPLANT
ELECT BUTTON BIOP 24F 90D PLAS (MISCELLANEOUS) IMPLANT
ELECT LOOP HF 26F 30D .35MM (CUTTING LOOP) IMPLANT
ELECT LOOP MED HF 24F 12D CBL (CLIP) ×4 IMPLANT
ELECT REM PT RETURN 9FT ADLT (ELECTROSURGICAL) ×4
ELECTRODE REM PT RTRN 9FT ADLT (ELECTROSURGICAL) ×3 IMPLANT
EVACUATOR MICROVAS BLADDER (UROLOGICAL SUPPLIES) IMPLANT
GLOVE BIO SURGEON STRL SZ7.5 (GLOVE) ×4 IMPLANT
GOWN PREVENTION PLUS LG XLONG (DISPOSABLE) ×4 IMPLANT
GOWN STRL REIN XL XLG (GOWN DISPOSABLE) ×4 IMPLANT
GOWN SURGICAL LARGE (GOWNS) ×4 IMPLANT
GUIDEWIRE 0.038 PTFE COATED (WIRE) IMPLANT
GUIDEWIRE ANG ZIPWIRE 038X150 (WIRE) IMPLANT
GUIDEWIRE STR DUAL SENSOR (WIRE) ×4 IMPLANT
HOLDER FOLEY CATH W/STRAP (MISCELLANEOUS) ×4 IMPLANT
IV NS IRRIG 3000ML ARTHROMATIC (IV SOLUTION) ×12 IMPLANT
KIT ASPIRATION TUBING (SET/KITS/TRAYS/PACK) IMPLANT
KIT BALLIN UROMAX 15FX10 (LABEL) IMPLANT
KIT BALLN UROMAX 15FX4 (MISCELLANEOUS) IMPLANT
KIT BALLN UROMAX 26 75X4 (MISCELLANEOUS)
LOOP CUTTING 24FR OLYMPUS (CUTTING LOOP) IMPLANT
PACK CYSTOSCOPY (CUSTOM PROCEDURE TRAY) ×4 IMPLANT
PLUG CATH AND CAP STER (CATHETERS) IMPLANT
SET ASPIRATION TUBING (TUBING) IMPLANT
SET HIGH PRES BAL DIL (LABEL)
SHEATH ACCESS URETERAL 38CM (SHEATH) IMPLANT
SHEATH ACCESS URETERAL 54CM (SHEATH) IMPLANT
STENT CONTOUR 6FRX26X.038 (STENTS) IMPLANT
STENT URET 6FRX26 CONTOUR (STENTS) ×4 IMPLANT
SYRINGE IRR TOOMEY STRL 70CC (SYRINGE) ×4 IMPLANT

## 2011-10-22 NOTE — Progress Notes (Signed)
After pt got dressed w assistance of wife, pt became nauseated. No emesis & nausea resolved after belching.

## 2011-10-22 NOTE — Progress Notes (Signed)
Dr. Mena Goes paged via beeper to come and assess urine.

## 2011-10-22 NOTE — Transfer of Care (Signed)
  Immediate Anesthesia Transfer of Care Note  Patient: Charles Wright  Procedure(s) Performed: Procedure(s) (LRB): TRANSURETHRAL RESECTION OF BLADDER TUMOR (TURBT) (N/A) URETEROSCOPY (Left) CYSTOSCOPY WITH STENT PLACEMENT (Left)  Patient Location: Patient transported to PACU with oxygen via face mask at 4 Liters / Min  Anesthesia Type: General  Level of Consciousness: awake and alert   Airway & Oxygen Therapy: Patient Spontanous Breathing and Patient connected to face mask oxygen  Post-op Assessment: Report given to PACU RN and Post -op Vital signs reviewed and stable  Post vital signs: Reviewed and stable  Dentition: Teeth and oropharynx remain in pre-op condition  Complications: No apparent anesthesia complications

## 2011-10-22 NOTE — Op Note (Signed)
Preoperative diagnosis: Hematuria, bladder neoplasm Postoperative diagnosis: Hematuria, right bladder neoplasm, neoplasm of left ureteral orifice   Procedure: Exam under anesthesia Cystoscopy Bladder biopsy and fulguration Transurethral resection bladder tumor  Left retrograde pyelogram Left ureteroscopy Left ureteral stent placement  Surgeon: Mena Goes  Anesthesia: Gen.  Findings: Exam under anesthesia-normal circumcised penis without mass or lesion. Testicles palpably normal without mass. Bimanual exam revealed no masses on palpation of the abdomen and the prostate was normal without heart area or nodule.  Cystoscopy - papillary but broad-based tumor involving the right posterior bladder wall. Papillary tumor in the left intramural ureter. High bladder neck. Otherwise normal. All tumor on the right side was fulgurated and resected in the intramural ureter.  Left retrograde pyelogram-normal ureter without filling defect or dilation. Normal collecting system and renal pelvis without filling defect or dilation.  Left ureteroscopy-papillary tumor involving the left intramural ureter and ureteral orifice. The distal ureter was clear/normal. There was a ureteral stricture at the junction of the distal to mid ureter which was patent but would not allow the scope to pass. Visualization cannot be performed proximal to this.  Description of procedure: After consent was obtained patient brought to the operating room. A timeout was performed to confirm the patient and procedure. An exam under anesthesia was performed. After adequate anesthesia he was placed in lithotomy position and prepped and draped in the usual sterile fashion. A cystoscope was passed per urethra and the bladder was examined with a 12 and 70 lens and its entirety. Findings dictated. Using cold cup biopsy forceps the right bladder lesion was biopsied x3. The underlying bladder muscle look normal. The scope was then removed. The  resectoscope was passed in the loop was used to fulgurate the right biopsy sites as well as any abnormal mucosa. Attention was then turned to the left ureteral orifice where a papillary tumor was emanating this was resected and with 3 shallow passes and there was still a small amount of tumor remaining. The gauge the extent of tumor I passed a sensor wire and a 6 [place] open-ended catheter into the mid ureter. The sensor wire was removed and retrograde injection of contrast was performed to confirm appropriate placement of wire in the lumen. A sensor wire was then advanced and coiled back in the upper pole. Ureteroscopy was then performed and only a small amount of tumor remained in the intramural ureter. The distal ureter once through the bladder to time all appeared normal. There was a ureteral stricture that had been dilated in the past and this appeared patent but would not allow the scope to pass. Given only a small amount of tumor remaining the rigid ureteroscope was removed and the resectoscope replaced. With another shallow swipe all tumor was eradicated. The ureter could be visualized and normal mucosa seen. The sensor wire was replaced, some contrast remained in the collecting system confirm wire placement. The ureteral orifice and resection site was not fulgurated. It was only cut. 6 x 26 cm stent was advanced and the wire removed with a good coil fluoroscopically and cystoscopically. Hemostasis was good. The scope was removed and an 74 [place] Foley was placed draining clear urine. The patient was awakened taken to recovery room in stable condition.  Complications: None  Drains: Left 6 x 26 cm ureteral stent without string  Specimens: 1 right bladder biopsy 2 right bladder biopsy - more posterior 3 right bladder biopsy - more superior 4 left ureteral orifice resection  Estimated blood loss: Minimal  Disposition: Patient stable to PACU

## 2011-10-22 NOTE — Anesthesia Postprocedure Evaluation (Signed)
  Anesthesia Post-op Note  Patient: Charles Wright  Procedure(s) Performed: Procedure(s) (LRB): TRANSURETHRAL RESECTION OF BLADDER TUMOR (TURBT) (N/A) URETEROSCOPY (Left) CYSTOSCOPY WITH STENT PLACEMENT (Left)  Patient Location: PACU  Anesthesia Type: General  Level of Consciousness: awake and alert   Airway and Oxygen Therapy: Patient Spontanous Breathing  Post-op Pain: mild  Post-op Assessment: Post-op Vital signs reviewed, Patient's Cardiovascular Status Stable, Respiratory Function Stable, Patent Airway and No signs of Nausea or vomiting  Post-op Vital Signs: stable  Complications: No apparent anesthesia complications

## 2011-10-22 NOTE — Anesthesia Procedure Notes (Signed)
Procedure Name: LMA Insertion Date/Time: 10/22/2011 9:40 AM Performed by: Fran Lowes Pre-anesthesia Checklist: Patient identified, Emergency Drugs available, Suction available and Patient being monitored Patient Re-evaluated:Patient Re-evaluated prior to inductionOxygen Delivery Method: Circle System Utilized Preoxygenation: Pre-oxygenation with 100% oxygen Intubation Type: IV induction Ventilation: Mask ventilation without difficulty LMA: LMA inserted LMA Size: 4.0 Number of attempts: 1 Airway Equipment and Method: bite block Placement Confirmation: positive ETCO2 Tube secured with: Tape Dental Injury: Teeth and Oropharynx as per pre-operative assessment

## 2011-10-22 NOTE — Interval H&P Note (Signed)
History and Physical Interval Note:  10/22/2011 9:30 AM  Charles Wright  has presented today for surgery, with the diagnosis of BLADDER NEOPLASM  The various methods of treatment have been discussed with the patient and family. After consideration of risks, benefits and other options for treatment, the patient has consented to  Procedure(s) (LRB) with comments: TRANSURETHRAL RESECTION OF BLADDER TUMOR (TURBT) (N/A) - TURBT, LEFT URETEROSCOPY, POSSIBLE URETERAL STENT C-ARM  URETEROSCOPY (Left) CYSTOSCOPY WITH STENT PLACEMENT (Left) as a surgical intervention .  The patient's history has been reviewed, patient examined, no change in status, stable for surgery.  I have reviewed the patient's chart and labs.  Questions were answered to the patient's satisfaction.  CT and delayed imaging normal.    Antony Haste

## 2011-10-22 NOTE — Anesthesia Preprocedure Evaluation (Addendum)
Anesthesia Evaluation  Patient identified by MRN, date of birth, ID band Patient awake    Reviewed: Allergy & Precautions, H&P , NPO status , Patient's Chart, lab work & pertinent test results  Airway Mallampati: II TM Distance: >3 FB Neck ROM: Full    Dental No notable dental hx.    Pulmonary neg pulmonary ROS, sleep apnea ,  breath sounds clear to auscultation  Pulmonary exam normal       Cardiovascular hypertension, Pt. on medications negative cardio ROS  Rhythm:Regular Rate:Normal     Neuro/Psych negative neurological ROS  negative psych ROS   GI/Hepatic negative GI ROS, Neg liver ROS, hiatal hernia,   Endo/Other  negative endocrine ROS  Renal/GU negative Renal ROS  negative genitourinary   Musculoskeletal negative musculoskeletal ROS (+)   Abdominal   Peds negative pediatric ROS (+)  Hematology negative hematology ROS (+)   Anesthesia Other Findings Upper front caps  Reproductive/Obstetrics negative OB ROS                          Anesthesia Physical Anesthesia Plan  ASA: II  Anesthesia Plan: General   Post-op Pain Management:    Induction: Intravenous  Airway Management Planned: LMA  Additional Equipment:   Intra-op Plan:   Post-operative Plan:   Informed Consent: I have reviewed the patients History and Physical, chart, labs and discussed the procedure including the risks, benefits and alternatives for the proposed anesthesia with the patient or authorized representative who has indicated his/her understanding and acceptance.   Dental advisory given  Plan Discussed with: CRNA  Anesthesia Plan Comments:         Anesthesia Quick Evaluation

## 2011-10-25 ENCOUNTER — Encounter (HOSPITAL_BASED_OUTPATIENT_CLINIC_OR_DEPARTMENT_OTHER): Payer: Self-pay | Admitting: Urology

## 2012-02-09 HISTORY — PX: BACK SURGERY: SHX140

## 2012-07-14 ENCOUNTER — Other Ambulatory Visit: Payer: Self-pay | Admitting: Urology

## 2012-08-16 ENCOUNTER — Encounter (HOSPITAL_BASED_OUTPATIENT_CLINIC_OR_DEPARTMENT_OTHER): Payer: Self-pay | Admitting: *Deleted

## 2012-08-16 NOTE — Progress Notes (Addendum)
NPO AFTER MN. ARRIVES AT 0630. NEEDS ISTAT. CURRENT EKG IN EPIC AND CHART. WILL CALL OFFICE TO VERIFY FLEET ENEMA AM OF SURG.  CALL BACK FROM DR ESKRIDGE, PT IS TO DO FLEET ENEMA AM OF SURG. SPOKE W/ PT , VERBALIZED UNDERSTANDING OF INSTRUCTIONS.

## 2012-08-21 NOTE — H&P (Signed)
  H&P   History of Present Illness: PSA rising about 0.5 / year but new prostate nodule on right. H/o HG Ta bladder cancer s/p BCG. CT June 2014 with excellent ureteral opacification but left renal pelvis not as clear. No evidence of urothelial or prostatic neoplasm. Prior left URS would only allow visual on distal ureter as there is a ureteral stricture.   Pt presents for 1) exam under anesthesia, 2) cystoscopy, left retrograde pyelogram, possible bladder biopsy, TURBT, left ureteroscopy, stent, and 3) TRUS prostate biopsy.   He has been well. No fever, dysuria, gross hematuria.   Past Medical History  Diagnosis Date  . Hypertension   . Hyperlipidemia   . OSA (obstructive sleep apnea) NON-COMPLIANT CPAP  . Bladder cancer    Past Surgical History  Procedure Laterality Date  . Orif left ankle fx  2005  . Bilateral inguinal hernia repair    . Cysto/ left retrograde pyelogram/ ureteroscopy / brush bx/ stent placement  05-01-2008  DR [facility]  . Cysto/ bladder bx's/ left ureteral stricture dilation/ right retrograde pyelogram/ left stent placement  05-15-2008  DR [facility]  . Transurethral resection of bladder tumor  10/22/2011    Procedure: TRANSURETHRAL RESECTION OF BLADDER TUMOR (TURBT);  Surgeon: Antony Haste, MD;  Location: [facility];  Service: Urology;  Laterality: N/A;  TURBT, LEFT URETEROSCOPY, POSSIBLE URETERAL STENT C-ARM   . Ureteroscopy  10/22/2011    Procedure: URETEROSCOPY;  Surgeon: Antony Haste, MD;  Location: [facility];  Service: Urology;  Laterality: Left;    Home Medications:  No prescriptions prior to admission   Allergies:  Allergies  Allergen Reactions  . Oxycodone Other (See Comments)    ALTERED MENTAL STATIS    History reviewed. No pertinent family history. Social History:  reports that he has never smoked. He has never used smokeless tobacco. He reports that he drinks about 8.4 ounces of alcohol per  week. He reports that he does not use illicit drugs.  ROS: A complete review of systems was performed.  All systems are negative except for pertinent findings as noted. @ROS @   Physical Exam:  Vital signs in last 24 hours:   General:  Alert and oriented, No acute distress HEENT: Normocephalic, atraumatic Neck: No JVD or lymphadenopathy Cardiovascular: Regular rate and rhythm Lungs: Regular rate and effort Abdomen: Soft, nontender, nondistended, no abdominal masses Back: No CVA tenderness Extremities: No edema Neurologic: Grossly intact  Laboratory Data:  No results found for this or any previous visit (from the past 24 hour(s)). No results found for this or any previous visit (from the past 240 hour(s)). Creatinine: No results found for this basename: CREATININE,  in the last 168 hours  Impression/Assessment:  Bladder cacner Prostate hard area or nodule  Plan:  I discussed with the patient the nature, potential benefits, risks and alternatives to exam under anesthesia, 2) cystoscopy, left retrograde pyelogram, possible bladder biopsy, TURBT, left ureteroscopy, poss ureteral balloon dilation, poss stent, and 3) TRUS prostate biopsy, including side effects of the proposed treatment, the likelihood of the patient achieving the goals of the procedure, and any potential problems that might occur during the procedure or recuperation. All questions answered. Patient elects to proceed.    Antony Haste

## 2012-08-22 ENCOUNTER — Ambulatory Visit (HOSPITAL_COMMUNITY): Payer: Medicare Other

## 2012-08-22 ENCOUNTER — Ambulatory Visit (HOSPITAL_BASED_OUTPATIENT_CLINIC_OR_DEPARTMENT_OTHER): Payer: Medicare Other | Admitting: Anesthesiology

## 2012-08-22 ENCOUNTER — Encounter (HOSPITAL_BASED_OUTPATIENT_CLINIC_OR_DEPARTMENT_OTHER): Payer: Self-pay | Admitting: Anesthesiology

## 2012-08-22 ENCOUNTER — Encounter (HOSPITAL_BASED_OUTPATIENT_CLINIC_OR_DEPARTMENT_OTHER): Payer: Self-pay

## 2012-08-22 ENCOUNTER — Encounter (HOSPITAL_BASED_OUTPATIENT_CLINIC_OR_DEPARTMENT_OTHER)
Admission: RE | Disposition: A | Discharge status: Home / Self Care | Payer: Self-pay | Source: Ambulatory Visit | Attending: Urology

## 2012-08-22 ENCOUNTER — Ambulatory Visit (HOSPITAL_BASED_OUTPATIENT_CLINIC_OR_DEPARTMENT_OTHER)
Admission: RE | Admit: 2012-08-22 | Discharge: 2012-08-22 | Disposition: A | Discharge status: Home / Self Care | Payer: Medicare Other | Source: Ambulatory Visit | Attending: Urology | Admitting: Urology

## 2012-08-22 DIAGNOSIS — G4733 Obstructive sleep apnea (adult) (pediatric): Secondary | ICD-10-CM | POA: Insufficient documentation

## 2012-08-22 DIAGNOSIS — E785 Hyperlipidemia, unspecified: Secondary | ICD-10-CM | POA: Insufficient documentation

## 2012-08-22 DIAGNOSIS — I10 Essential (primary) hypertension: Secondary | ICD-10-CM | POA: Insufficient documentation

## 2012-08-22 DIAGNOSIS — N3020 Other chronic cystitis without hematuria: Secondary | ICD-10-CM | POA: Insufficient documentation

## 2012-08-22 DIAGNOSIS — C61 Malignant neoplasm of prostate: Secondary | ICD-10-CM | POA: Insufficient documentation

## 2012-08-22 DIAGNOSIS — N135 Crossing vessel and stricture of ureter without hydronephrosis: Secondary | ICD-10-CM | POA: Insufficient documentation

## 2012-08-22 DIAGNOSIS — N400 Enlarged prostate without lower urinary tract symptoms: Secondary | ICD-10-CM | POA: Insufficient documentation

## 2012-08-22 DIAGNOSIS — C679 Malignant neoplasm of bladder, unspecified: Secondary | ICD-10-CM | POA: Insufficient documentation

## 2012-08-22 HISTORY — PX: CYSTOSCOPY W/ RETROGRADES: SHX1426

## 2012-08-22 HISTORY — PX: PROSTATE BIOPSY: SHX241

## 2012-08-22 HISTORY — DX: Malignant neoplasm of bladder, unspecified: C67.9

## 2012-08-22 HISTORY — PX: CYSTOSCOPY WITH BIOPSY: SHX5122

## 2012-08-22 LAB — POCT I-STAT, CHEM 8
Chloride: 105 mEq/L (ref 96–112)
HCT: 46 % (ref 39.0–52.0)
Potassium: 4 mEq/L (ref 3.5–5.1)

## 2012-08-22 SURGERY — BIOPSY, PROSTATE, RECTAL APPROACH, WITH US GUIDANCE
Anesthesia: General | Site: Ureter | Wound class: Clean Contaminated

## 2012-08-22 MED ORDER — LIDOCAINE HCL (CARDIAC) 20 MG/ML IV SOLN
INTRAVENOUS | Status: DC | PRN
  Administered 2012-08-22: 80 mg via INTRAVENOUS

## 2012-08-22 MED ORDER — STERILE WATER FOR IRRIGATION IR SOLN
Status: DC | PRN
  Administered 2012-08-22: 3000 mL

## 2012-08-22 MED ORDER — LEVOFLOXACIN 500 MG PO TABS: 500.0000 mg | ORAL_TABLET | Freq: Every day | ORAL | Status: DC

## 2012-08-22 MED ORDER — ASPIRIN 81 MG PO TABS: 81.0000 mg | ORAL_TABLET | ORAL | Status: DC | PRN

## 2012-08-22 MED ORDER — KETOROLAC TROMETHAMINE 30 MG/ML IJ SOLN
INTRAMUSCULAR | Status: DC | PRN
  Administered 2012-08-22: 15 mg via INTRAVENOUS

## 2012-08-22 MED ORDER — GENTAMICIN SULFATE 40 MG/ML IJ SOLN
400.0000 mg | INTRAVENOUS | Status: DC
  Filled 2012-08-22: qty 10

## 2012-08-22 MED ORDER — CEFAZOLIN SODIUM-DEXTROSE 2-3 GM-% IV SOLR
2.0000 g | INTRAVENOUS | Status: AC
Start: 2012-08-22 — End: 2012-08-22
  Administered 2012-08-22: 2 g via INTRAVENOUS
  Filled 2012-08-22: qty 50

## 2012-08-22 MED ORDER — IOHEXOL 350 MG/ML SOLN
INTRAVENOUS | Status: DC | PRN
  Administered 2012-08-22: 5 mL

## 2012-08-22 MED ORDER — FENTANYL CITRATE 0.05 MG/ML IJ SOLN
25.0000 ug | INTRAMUSCULAR | Status: DC | PRN
  Filled 2012-08-22: qty 1

## 2012-08-22 MED ORDER — BELLADONNA ALKALOIDS-OPIUM 16.2-60 MG RE SUPP
RECTAL | Status: DC | PRN
  Administered 2012-08-22: 1 via RECTAL

## 2012-08-22 MED ORDER — PROMETHAZINE HCL 25 MG/ML IJ SOLN
6.2500 mg | INTRAMUSCULAR | Status: DC | PRN
  Filled 2012-08-22: qty 1

## 2012-08-22 MED ORDER — URIBEL 118 MG PO CAPS: 1.0000 | ORAL_CAPSULE | Freq: Four times a day (QID) | ORAL | Status: DC | PRN

## 2012-08-22 MED ORDER — FENTANYL CITRATE 0.05 MG/ML IJ SOLN
INTRAMUSCULAR | Status: DC | PRN
  Administered 2012-08-22 (×4): 50 ug via INTRAVENOUS

## 2012-08-22 MED ORDER — LACTATED RINGERS IV SOLN
INTRAVENOUS | Status: DC
  Administered 2012-08-22: 07:00:00 via INTRAVENOUS
  Filled 2012-08-22: qty 1000

## 2012-08-22 MED ORDER — KETOROLAC TROMETHAMINE 30 MG/ML IJ SOLN
15.0000 mg | Freq: Once | INTRAMUSCULAR | Status: DC | PRN
  Filled 2012-08-22: qty 1

## 2012-08-22 MED ORDER — PROPOFOL 10 MG/ML IV BOLUS
INTRAVENOUS | Status: DC | PRN
  Administered 2012-08-22: 200 mg via INTRAVENOUS
  Administered 2012-08-22: 100 mg via INTRAVENOUS

## 2012-08-22 MED ORDER — DEXAMETHASONE SODIUM PHOSPHATE 4 MG/ML IJ SOLN
INTRAMUSCULAR | Status: DC | PRN
  Administered 2012-08-22: 10 mg via INTRAVENOUS

## 2012-08-22 MED ORDER — ONDANSETRON HCL 4 MG/2ML IJ SOLN
INTRAMUSCULAR | Status: DC | PRN
  Administered 2012-08-22: 4 mg via INTRAVENOUS

## 2012-08-22 MED ORDER — SODIUM CHLORIDE 0.9 % IR SOLN
Status: DC | PRN
  Administered 2012-08-22: 6000 mL

## 2012-08-22 MED ORDER — CEFAZOLIN SODIUM 1-5 GM-% IV SOLN
1.0000 g | INTRAVENOUS | Status: DC
  Filled 2012-08-22: qty 50

## 2012-08-22 MED ORDER — GENTAMICIN SULFATE 40 MG/ML IJ SOLN
494.5000 mg | INTRAVENOUS | Status: DC | PRN
  Administered 2012-08-22: 400 mg via INTRAVENOUS

## 2012-08-22 SURGICAL SUPPLY — 58 items
ADAPTER CATH URET PLST 4-6FR (CATHETERS) IMPLANT
ADPR CATH URET STRL DISP 4-6FR (CATHETERS)
BAG DRAIN URO-CYSTO SKYTR STRL (DRAIN) ×6 IMPLANT
BAG DRN ANRFLXCHMBR STRAP LEK (BAG)
BAG DRN UROCATH (DRAIN) ×4
BAG URINE DRAINAGE (UROLOGICAL SUPPLIES) IMPLANT
BAG URINE LEG 19OZ MD ST LTX (BAG) IMPLANT
BASKET LASER NITINOL 1.9FR (BASKET) IMPLANT
BASKET STNLS GEMINI 4WIRE 3FR (BASKET) IMPLANT
BASKET ZERO TIP NITINOL 2.4FR (BASKET) IMPLANT
BRUSH URET BIOPSY 3F (UROLOGICAL SUPPLIES) IMPLANT
BSKT STON RTRVL 120 1.9FR (BASKET)
BSKT STON RTRVL GEM 120X11 3FR (BASKET)
BSKT STON RTRVL ZERO TP 2.4FR (BASKET)
CANISTER SUCT LVC 12 LTR MEDI- (MISCELLANEOUS) ×6 IMPLANT
CATH FOLEY 2WAY SLVR  5CC 20FR (CATHETERS)
CATH FOLEY 2WAY SLVR  5CC 22FR (CATHETERS)
CATH FOLEY 2WAY SLVR 5CC 20FR (CATHETERS) IMPLANT
CATH FOLEY 2WAY SLVR 5CC 22FR (CATHETERS) IMPLANT
CATH INTERMIT  6FR 70CM (CATHETERS) IMPLANT
CATH URET 5FR 28IN CONE TIP (BALLOONS)
CATH URET 5FR 28IN OPEN ENDED (CATHETERS) IMPLANT
CATH URET 5FR 70CM CONE TIP (BALLOONS) IMPLANT
CLOTH BEACON ORANGE TIMEOUT ST (SAFETY) ×6 IMPLANT
DRAPE CAMERA CLOSED 9X96 (DRAPES) ×6 IMPLANT
DRESSING TELFA 8X3 (GAUZE/BANDAGES/DRESSINGS) ×6 IMPLANT
ELECT BUTTON BIOP 24F 90D PLAS (MISCELLANEOUS) IMPLANT
ELECT LOOP HF 26F 30D .35MM (CUTTING LOOP) IMPLANT
ELECT REM PT RETURN 9FT ADLT (ELECTROSURGICAL) ×6
ELECTRODE REM PT RTRN 9FT ADLT (ELECTROSURGICAL) ×5 IMPLANT
EVACUATOR MICROVAS BLADDER (UROLOGICAL SUPPLIES) IMPLANT
GLOVE BIO SURGEON STRL SZ7 (GLOVE) ×6 IMPLANT
GLOVE BIO SURGEON STRL SZ7.5 (GLOVE) ×6 IMPLANT
GLOVE BIOGEL PI IND STRL 7.0 (GLOVE) ×10 IMPLANT
GLOVE BIOGEL PI INDICATOR 7.0 (GLOVE) ×2
GLOVE SURG SS PI 8.0 STRL IVOR (GLOVE) IMPLANT
GOWN PREVENTION PLUS LG XLONG (DISPOSABLE) ×6 IMPLANT
GOWN STRL REIN XL XLG (GOWN DISPOSABLE) ×6 IMPLANT
GUIDEWIRE 0.038 PTFE COATED (WIRE) ×6 IMPLANT
GUIDEWIRE ANG ZIPWIRE 038X150 (WIRE) IMPLANT
GUIDEWIRE STR DUAL SENSOR (WIRE) IMPLANT
HOLDER FOLEY CATH W/STRAP (MISCELLANEOUS) IMPLANT
IV NS IRRIG 3000ML ARTHROMATIC (IV SOLUTION) ×12 IMPLANT
KIT ASPIRATION TUBING (SET/KITS/TRAYS/PACK) IMPLANT
KIT BALLIN UROMAX 15FX10 (LABEL) IMPLANT
KIT BALLN UROMAX 15FX4 (MISCELLANEOUS) IMPLANT
KIT BALLN UROMAX 26 75X4 (MISCELLANEOUS)
LOOP CUTTING 24FR OLYMPUS (CUTTING LOOP) IMPLANT
PACK CYSTOSCOPY (CUSTOM PROCEDURE TRAY) ×6 IMPLANT
PLUG CATH AND CAP STER (CATHETERS) IMPLANT
SET ASPIRATION TUBING (TUBING) IMPLANT
SET HIGH PRES BAL DIL (LABEL)
SHEATH ACCESS URETERAL 38CM (SHEATH) IMPLANT
SHEATH ACCESS URETERAL 54CM (SHEATH) IMPLANT
SURGILUBE 2OZ TUBE FLIPTOP (MISCELLANEOUS) ×6 IMPLANT
SYRINGE IRR TOOMEY STRL 70CC (SYRINGE) IMPLANT
TOWEL OR 17X24 6PK STRL BLUE (TOWEL DISPOSABLE) ×6 IMPLANT
UNDERPAD 30X30 INCONTINENT (UNDERPADS AND DIAPERS) ×6 IMPLANT

## 2012-08-22 NOTE — Transfer of Care (Signed)
Immediate Anesthesia Transfer of Care Note  Patient: Charles Wright  Procedure(s) Performed: Procedure(s): BIOPSY TRANSRECTAL ULTRASONIC PROSTATE (TUBP) (N/A) CYSTOSCOPY WITH RETROGRADE PYELOGRAM;  left kidney washings (Left) CYSTOSCOPY WITH BIOPSY  Patient Location: PACU  Anesthesia Type:General  Level of Consciousness: awake, alert  and oriented  Airway & Oxygen Therapy: Patient Spontanous Breathing and Patient connected to nasal cannula oxygen  Post-op Assessment: Report given to PACU RN  Post vital signs: Reviewed and stable  Complications: No apparent anesthesia complications

## 2012-08-22 NOTE — Op Note (Signed)
Pre-op diagnosis: Bladder cancer Prostate nodule Left renal neoplasm  Postop diagnosis:  Bladder cancer Prostate nodule   Procedure: Cystoscopy, bladder biopsy fulguration  Left renal wash cytology Left retrograde pyelogram and interpretation  DRE - right apical nodule Transrectal ultrasound prostate  Prostate bx - 12 core, right and left base mid apex, lateral and medial Ultrasound guidance for prostate biopsy  Findings: Cystoscopy - posterior erythema possible early papillary appearance biopsied x2  Left renal wash cytology  Left retrograde pyelogram - this outlined a single collecting system and single ureter unit. The collecting system and renal pelvis was normal without dilation or filling defect. The ureter appeared normal without filling defect or dilation. In the mid ureter there was slight impression from the iliac vessels and and mild dilation of the ureter where it tapered again down toward his known ureteral stricture but there is no significant narrowing in the ureter for about normal into the bladder. There was excellent efflux of contrast.  DRE - right apical nodule (visible on U/S - hyperechoic, shadowing)  Transrectal ultrasound prostate: The prostate appeared normal apart from some calcifications around the urethra and extended into a nodular formation at the right apex. These were hyperechoic areas with some shadowing. Prostate weight 4.9 cm, height 3 cm, with 5.43 cm Prostate volume 41.83 ml  Prostate bx (apical nodule directly samples in right apical bx) - core grossly looked different - dark, stone-like material  Description of procedure: After consent was obtained patient brought to the operating room. After adequate anesthesia he is placed in lithotomy position and prepped and draped in the usual fashion. A timeout was performed to confirm the patient and procedure. Cystoscope was passed per urethra which revealed a normal urethra and normal prostatic  urethra. There was mild BPH. The bladder itself was inspected carefully with a 12 and 70 lens and noted to have some posterior erythema with possible early papillary appearance but this is low suspicion. There were no other mucosal abnormalities foreign bodies or stones. The left ureteral orifice showed excellent clear efflux of urine and I could see inside the ureteral orifice and there was no neoplasm. The trigone and right ureteral orifice appeared normal and there was clear efflux of urine. The posterior erythema was biopsied x2 and fulgurated with the Bugbee.   A sensor wire was advanced and left proximal ureter and an open-ended catheter advanced up to the area of the left renal pelvis. Saline wash was collected for cytology. Contrast was injected retrograde through the 5 French catheter and images were obtained. The catheter was drawn and the ureter inspected with fluoroscopy all the way down to the bladder. There were no pathologic findings.  Under low-pressure the biopsy sites had excellent   hemostasis. The bladder was drained and the scope was removed.  A digital rectal exam was performed. The ultrasound probe was inserted and representative images of the prostate obtained. A 12 core needle biopsy was then performed with ultrasound guidance. The patient tolerated the procedure well. There was excellent hemostasis. There were some probe was removed in a B&O suppository placed. The patient was awakened and taken to the recovery room in stable condition.  Complications: None Blood loss: Minimal Drains: None  Specimens: 1) bladder biopsy x2 2-13) transrectal ultrasound prostate biopsy, right and left, base,mid, apex, lateral and medial To pathology  Disposition: Patient stable to PACU.

## 2012-08-22 NOTE — Anesthesia Postprocedure Evaluation (Signed)
  Anesthesia Post-op Note  Patient: Charles Wright  Procedure(s) Performed: Procedure(s) (LRB): BIOPSY TRANSRECTAL ULTRASONIC PROSTATE (TUBP) (N/A) CYSTOSCOPY WITH RETROGRADE PYELOGRAM;  left kidney washings (Left) CYSTOSCOPY WITH BIOPSY  Patient Location: PACU  Anesthesia Type: General  Level of Consciousness: awake and alert   Airway and Oxygen Therapy: Patient Spontanous Breathing  Post-op Pain: mild  Post-op Assessment: Post-op Vital signs reviewed, Patient's Cardiovascular Status Stable, Respiratory Function Stable, Patent Airway and No signs of Nausea or vomiting  Last Vitals:  Filed Vitals:   08/22/12 0918  BP: 120/70  Pulse: 83  Temp: 36 C  Resp: 13    Post-op Vital Signs: stable   Complications: No apparent anesthesia complications

## 2012-08-22 NOTE — Anesthesia Procedure Notes (Signed)
Procedure Name: LMA Insertion Date/Time: 08/22/2012 8:06 AM Performed by: Maris Berger T Pre-anesthesia Checklist: Patient identified, Emergency Drugs available, Suction available and Patient being monitored Patient Re-evaluated:Patient Re-evaluated prior to inductionOxygen Delivery Method: Circle System Utilized Preoxygenation: Pre-oxygenation with 100% oxygen Intubation Type: IV induction Ventilation: Mask ventilation without difficulty LMA: LMA inserted LMA Size: 5.0 Number of attempts: 1 Airway Equipment and Method: bite block Placement Confirmation: positive ETCO2 Dental Injury: Teeth and Oropharynx as per pre-operative assessment

## 2012-08-22 NOTE — Anesthesia Preprocedure Evaluation (Signed)
Anesthesia Evaluation  Patient identified by MRN, date of birth, ID band Patient awake    Reviewed: Allergy & Precautions, H&P , NPO status , Patient's Chart, lab work & pertinent test results  Airway Mallampati: II TM Distance: >3 FB Neck ROM: Full    Dental no notable dental hx.    Pulmonary sleep apnea ,  breath sounds clear to auscultation  Pulmonary exam normal       Cardiovascular negative cardio ROS  Rhythm:Regular Rate:Normal     Neuro/Psych negative neurological ROS  negative psych ROS   GI/Hepatic negative GI ROS, Neg liver ROS,   Endo/Other  Morbid obesity  Renal/GU negative Renal ROS  negative genitourinary   Musculoskeletal negative musculoskeletal ROS (+)   Abdominal   Peds negative pediatric ROS (+)  Hematology negative hematology ROS (+)   Anesthesia Other Findings   Reproductive/Obstetrics negative OB ROS                           Anesthesia Physical Anesthesia Plan  ASA: II  Anesthesia Plan: General   Post-op Pain Management:    Induction: Intravenous  Airway Management Planned: LMA  Additional Equipment:   Intra-op Plan:   Post-operative Plan:   Informed Consent: I have reviewed the patients History and Physical, chart, labs and discussed the procedure including the risks, benefits and alternatives for the proposed anesthesia with the patient or authorized representative who has indicated his/her understanding and acceptance.   Dental advisory given  Plan Discussed with: CRNA and Surgeon  Anesthesia Plan Comments:         Anesthesia Quick Evaluation

## 2012-08-23 ENCOUNTER — Encounter (HOSPITAL_BASED_OUTPATIENT_CLINIC_OR_DEPARTMENT_OTHER): Payer: Self-pay | Admitting: Urology

## 2013-01-16 ENCOUNTER — Other Ambulatory Visit: Payer: Self-pay | Admitting: Family Medicine

## 2013-01-16 ENCOUNTER — Ambulatory Visit
Admission: RE | Admit: 2013-01-16 | Discharge: 2013-01-16 | Disposition: A | Discharge status: Home / Self Care | Payer: Medicare Other | Source: Ambulatory Visit | Attending: Family Medicine | Admitting: Family Medicine

## 2013-01-16 DIAGNOSIS — R05 Cough: Secondary | ICD-10-CM

## 2013-01-26 ENCOUNTER — Encounter (INDEPENDENT_AMBULATORY_CARE_PROVIDER_SITE_OTHER): Payer: Self-pay

## 2013-01-26 ENCOUNTER — Ambulatory Visit (INDEPENDENT_AMBULATORY_CARE_PROVIDER_SITE_OTHER): Payer: Medicare Other | Admitting: Internal Medicine

## 2013-01-26 ENCOUNTER — Encounter: Payer: Self-pay | Admitting: Internal Medicine

## 2013-01-26 VITALS — BP 118/80 | HR 66 | Temp 97.7°F | Ht 66.5 in | Wt 210.0 lb

## 2013-01-26 DIAGNOSIS — R059 Cough, unspecified: Secondary | ICD-10-CM

## 2013-01-26 DIAGNOSIS — R05 Cough: Secondary | ICD-10-CM

## 2013-01-26 MED ORDER — OLMESARTAN MEDOXOMIL 20 MG PO TABS: 20.0000 mg | ORAL_TABLET | Freq: Every day | ORAL | Status: DC

## 2013-01-26 MED ORDER — FAMOTIDINE 20 MG PO TABS: ORAL_TABLET | ORAL | Status: DC

## 2013-01-26 MED ORDER — PREDNISONE 10 MG PO TABS: ORAL_TABLET | ORAL | Status: DC

## 2013-01-26 NOTE — Patient Instructions (Signed)
Stop cozaar  benicar 20 mg once daily x 4 week sample   Pepcid 20 mg ac one at bedtime along clortrimeton 4 mg one at time until cough gone for a week  Prednisone 10 mg take  4 each am x 2 days,   2 each am x 2 days,  1 each am x 2 days and stop   GERD (REFLUX)  is an extremely common cause of respiratory symptoms, many times with no significant heartburn at all.    It can be treated with medication, but also with lifestyle changes including avoidance of late meals, excessive alcohol, smoking cessation, and avoid fatty foods, chocolate, peppermint, colas, red wine, and acidic juices such as orange juice.  NO MINT OR MENTHOL PRODUCTS SO NO COUGH DROPS  USE SUGARLESS CANDY INSTEAD (jolley ranchers or Stover's)  NO OIL BASED VITAMINS - use powdered substitutes.     If you are satisfied with your treatment plan let your doctor know and he/she can either refill your medications or you can return here when your prescription runs out.     If in any way you are not 100% satisfied,  please tell us.  If 100% better, tell your friends!

## 2013-01-26 NOTE — Progress Notes (Signed)
   Subjective:    Patient ID: Charles Wright, male    DOB: 28-Apr-1935  MRN: [id number]  HPI  24 yowm never smoker with no previous hx of resp problems x osa and intol to acei which caused cough around 2008  With recurrent  onset cough around 1st Nov 2014 referred 01/26/2013 to pulmonary clinic by Dr Nicholos Johns.  01/26/2013 1st [facility] office visit/ Charles Wright on Cozar cc indolent onset cough around 1st of Nov then went on cruise and waxed and waned never productive initially 24 h per day but evolved to mostly a noct/ early am cough, notices immediately on lying down like a tickle but never produces any mucus and no sense of nasal drainage, congestion or overt reflux symptoms  No obvious day to day or daytime variabilty or assoc sob or cp or chest tightness, subjective wheeze overt sinus or hb symptoms. No unusual exp hx or h/o childhood pna/ asthma or knowledge of premature birth.   Also denies any obvious fluctuation of symptoms with weather or environmental changes or other aggravating or alleviating factors except as outlined above   Current Medications, Allergies, Complete Past Medical History, Past Surgical History, Family History, and Social History were reviewed in [employer] record.          Review of Systems  Constitutional: Negative for fever, chills, activity change, appetite change and unexpected weight change.  HENT: Negative for congestion, dental problem, postnasal drip, rhinorrhea, sneezing, sore throat, trouble swallowing and voice change.   Eyes: Negative for visual disturbance.  Respiratory: Positive for cough. Negative for choking and shortness of breath.   Cardiovascular: Negative for chest pain and leg swelling.  Gastrointestinal: Negative for nausea, vomiting and abdominal pain.  Genitourinary: Negative for difficulty urinating.  Musculoskeletal: Negative for arthralgias.  Skin: Negative for rash.  Psychiatric/Behavioral: Negative for behavioral  problems and confusion.       Objective:   Physical Exam  amb hoarse wm nad  Wt Readings from Last 3 Encounters:  01/26/13 210 lb (95.255 kg)  08/22/12 218 lb (98.884 kg)  08/22/12 218 lb (98.884 kg)     HEENT: nl dentition, turbinates, and orophanx. Nl external ear canals without cough reflex   NECK :  without JVD/Nodes/TM/ nl carotid upstrokes bilaterally   LUNGS: no acc muscle use, clear to A and P bilaterally without cough on insp or exp maneuvers   CV:  RRR  no s3 or murmur or increase in P2, no edema   ABD:  soft and nontender with nl excursion in the supine position. No bruits or organomegaly, bowel sounds nl  MS:  warm without deformities, calf tenderness, cyanosis or clubbing  SKIN: warm and dry without lesions    NEURO:  alert, approp, no deficits    cxr 01/16/13  There is some unchanged scarring in the left lung base. The lungs  are otherwise clear. Heart size is normal. No pneumothorax or  pleural effusion.        Assessment & Plan:

## 2013-01-27 DIAGNOSIS — R05 Cough: Secondary | ICD-10-CM | POA: Insufficient documentation

## 2013-01-27 DIAGNOSIS — R058 Other specified cough: Secondary | ICD-10-CM | POA: Insufficient documentation

## 2013-01-27 NOTE — Assessment & Plan Note (Signed)
-   h/o ACEi intol  Strongly favor  Classic Upper airway cough syndrome, so named because it's frequently impossible to sort out how much is  CR/sinusitis with freq throat clearing (which can be related to primary GERD)   vs  causing  secondary (" extra esophageal")  GERD from wide swings in gastric pressure that occur with throat clearing, often  promoting self use of mint and menthol lozenges that reduce the lower esophageal sphincter tone and exacerbate the problem further in a cyclical fashion.   These are the same pts (now being labeled as having "irritable larynx syndrome" by some cough centers) who not infrequently have a history of having failed to tolerate ace inhibitors and now many reports of generic cozar causing similar cough,  dry powder inhalers or biphosphonates or report having atypical reflux symptoms that don't respond to standard doses of PPI , and are easily confused as having aecopd or asthma flares by even experienced allergists/ pulmonologists.  Not clear to me why generic cozar now being identified in the literature as causing upper airway problems similar to acei but I've had several patients now reporting the exact same symptoms resolve off it and on alternative arb so reasonable to try:  1) change to benicar samples for now 2) max acid suppression until cough is resolved to prevent cyclical cough > reflux > cough syndrome

## 2013-07-30 ENCOUNTER — Other Ambulatory Visit: Payer: Self-pay | Admitting: Urology

## 2013-08-14 ENCOUNTER — Encounter (HOSPITAL_BASED_OUTPATIENT_CLINIC_OR_DEPARTMENT_OTHER): Payer: Self-pay | Admitting: *Deleted

## 2013-08-14 NOTE — Progress Notes (Signed)
NPO AFTER MN. ARRIVE AT 0715. NEEDS ISTAT. CURRENT EKG IN CHART AND EPIC. WILL TAKE BENICAR AND PEPCID AM DOS W/ SIPS OF WATER.

## 2013-08-15 ENCOUNTER — Ambulatory Visit (HOSPITAL_COMMUNITY)
Admission: RE | Admit: 2013-08-15 | Discharge: 2013-08-15 | Disposition: A | Discharge status: Home / Self Care | Payer: Medicare Other | Source: Ambulatory Visit | Attending: Family Medicine | Admitting: Family Medicine

## 2013-08-15 ENCOUNTER — Other Ambulatory Visit: Payer: Self-pay | Admitting: Urology

## 2013-08-15 ENCOUNTER — Other Ambulatory Visit (HOSPITAL_COMMUNITY): Payer: Self-pay | Admitting: Family Medicine

## 2013-08-15 DIAGNOSIS — M79609 Pain in unspecified limb: Secondary | ICD-10-CM

## 2013-08-15 DIAGNOSIS — M7989 Other specified soft tissue disorders: Secondary | ICD-10-CM

## 2013-08-15 DIAGNOSIS — M25562 Pain in left knee: Secondary | ICD-10-CM

## 2013-08-15 NOTE — Progress Notes (Signed)
*  PRELIMINARY RESULTS* Vascular Ultrasound Left lower extremity venous duplex has been completed.  Preliminary findings: no evidence of DVT or baker's cyst.   Called results to [facility].   Landry Mellow, RDMS, RVT  08/15/2013, 3:35 PM

## 2013-08-15 NOTE — H&P (Signed)
H&P --   F/u-    1- bladder cancer  -Aug 2013 Cystoscopy revealed a tumor at the left U/O and right posterior bladder;   CT A/P was normal. Renal ultrasound - irregular shaped cystic area 1.19 cm RLP, 6 mm stone in the right midpole; left 2 scattered hyperechoic areas possible small stones about 3 mm each. There was a round hypoechoic area 2.5 cm which compared to 2.1 cm in 2011. The bladder and then 65 mL postvoid with a small rounded 1 cm area either a bladder mass or median lobe of prostate.   -Sept 13, 2013 - HGTa right bladder, HGTa left UO (muscle present in both and negative); stent placed; normal exam/DRE  -Nov 2013 completed induction BCG x 6  -Dec 2013 completed BCG x 6 and stent was removed.   -Mar 2014 compelted BCG maintenance  -June 2014 - CT A/P normal (although left renal pelvis did not fill out), BPH/prostate calcifications  -July 2014 - cysto, bladder bx fulguration (posterior erythema), left UO (patulous, good clear efflux, no neoplasm) - biopsies Benign with inflammation   Left renal wash cytology - a few slightly atypical cells favor reactive atypia   Left RGP - normal  -Oct 2014 normal cystoscopy  -Dec 2014 completed BCG maintenance, but needed a 16Fr coude. Cystoscopy normal/negative - no sx, false passage, tumor.   -Mar 2015 - urine cytology positive; cystoscopy normal; CT A/P hematuria - normal. No cancer, mass. Stable distal left ureteral stricture but no proximal hydronephrosis.      2-Prostate cancer/BPH/prostate nodule  -July 2013 PSA 3.06  -Sep 2013 normal DRE under anesthesia  -May 2014 PSA 3.8, nodule right prostate  -Jul 2014 DRE - right apical nodule (visible on U/S - hyperechoic, shadowing)  TRUS postate bx (apical nodule directly samples in right apical bx) - core looked different - dark, stone-like material   42 gram prostate  right apical nodule on DRE  Right base atypical small acinar proliferation  Right mid 3+3=6, 15%    Right apex - benign with inflammation  -Mar 2015 PSA 2.86     Prior hx:  He had an atypical cytology on a visit in early March 2010. He was set up for cystoscopy, bilateral retrogrades in May 2010. He has also had some kidney stones, but none have ever been recovered. He had his last stone in 06/2005, and his first one was around 2002 when he broke his back. He has 2 small left renal calculi seen on a CT scan done last fall 2009. CT scan March 2010 was negative for hydronephrosis but a little dilated distal ureter was noted. No ureteral stones were seen and a small stone in his left mid kidney that was nonobstructing. On May 01, 2008 he underwent a left retrograde pyelogram with the finding of a ureteral stricture about 2-3 cm just below the pelvic brim on the left side. There was no hydronephrosis. The ureteroscope was passed up to but not through the stricture. He was stented. He had another ureteroscopy in April 2010 that showed a stricture but it had to be dilated and a scope would not advance all the way through it. Brush biopsy of this area was negative. Then a larger stent was placed. Review of the CT scan did not show any peri-ureteral involvement in this area, but the etiology of the stricture is unknown. The stent was then removed after a short time and he has done well since May 2010. He had a IVP  July 2010 which was really pretty normal and showed no hydronephrosis or abnormality of the left distal ureter. CT scan January 2011 was negative, no hydronephrosis, and the hyperechoic area of the left kidney was a benign column of Bertin. Cytology was negative July 2010.      June 2015  Interval Hx  He returns and has been well. He has had no dysuria or gross hematuria. No UTI. He typically voids with a good stream and has no frequency or urgency.   Past Medical History Problems  1. History of Abdominal pain, LLQ (left lower quadrant) (789.04) 2. History of kidney stones (V13.01) 3.  History of Microscopic hematuria (599.72) 4. History of Nephrolithiasis Of The Left Kidney (V13.01) 5. History of Ureteral stricture (593.3)  Surgical History Problems  1. History of Biopsy Of The Prostate Needle 2. History of Cystoscopy With Biopsy 3. History of Cystoscopy With Fulguration Minor Lesion (Under 50mm) 4. History of Cystoscopy With Fulguration Small Lesion (5-28mm) 5. History of Cystoscopy With Insertion Of Ureteral Stent Left 6. History of Cystoscopy With Insertion Of Ureteral Stent Left 7. History of Cystoscopy With Insertion Of Ureteral Stent Left 8. History of Cystoscopy With Ureteral Cath And Brush Biopsy Left 9. History of Cystoscopy With Ureteroscopy For Biopsy Left 10. History of Cystoscopy With Ureteroscopy Left 11. History of Cystourethroscopy With Treatment Of Ureteral Stricture 12. History of Leg Repair 13. History of Oral Surgery  Current Meds 1. BCG Instillation; BCG half dose today; To Be Done: [id number]; Status: HOLD FOR -  Administration Ordered 2. BCG Instillation; BCG today full dose; To Be Done: [id number]; Status: HOLD FOR -  Administration Ordered 3. Benicar TABS;  Therapy: (Recorded:16Mar2015) to Recorded 4. EpiPen 0.3 MG/0.3ML DEVI;  Therapy: [phone] to Recorded 5. Ibuprofen 800 MG Oral Tablet;  Therapy: 27Apr2012 to Recorded 6. Multi-Vitamin/Iron TABS;  Therapy: (YQMVHQIO:[id number]) to Recorded 7. Vitamin D TABS;  Therapy: (Recorded:21Jul2014) to Recorded  Allergies Medication  1. OxyCODONE HCl TABS  Family History Problems  1. Family history of Death In The Family Father   Deceased at age 61 2. Family history of Death In The Family Mother   Deceased at age 62 3. No pertinent family history : Sibling  Social History Problems  1. Alcohol Use   2 daily 2. Caffeine Use   2 daily 3. Marital History - Currently Married 4. 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 Denied: History of Tobacco Use  (V15.82)  Vitals Vital Signs [Data Includes: Last 1 Day]  Recorded: 32GMW1027 10:17AM  Blood Pressure: 117 / 81 Temperature: 97.4 F Heart Rate: 90  Physical Exam Constitutional: Well nourished and well developed . No acute distress.  ENT:. The ears and nose are normal in appearance.  Neck: The appearance of the neck is normal and no neck mass is present.  Pulmonary: No respiratory distress and normal respiratory rhythm and effort.  Cardiovascular: Heart rate and rhythm are normal . No peripheral edema.  Abdomen: The abdomen is soft and nontender. No masses are palpated. No CVA tenderness. No hernias are palpable. No hepatosplenomegaly noted.  Rectal: Rectal exam demonstrates normal sphincter tone, no tenderness and no masses. Estimated prostate size is 1+. The prostate has no nodularity and is not tender. The left seminal vesicle is nonpalpable. The right seminal vesicle is nonpalpable. The perineum is normal on inspection.  Genitourinary: Examination of the penis demonstrates no discharge, no masses, no lesions and a normal meatus. The scrotum is without lesions. The right epididymis  is palpably normal and non-tender. The left epididymis is palpably normal and non-tender. The right testis is non-tender and without masses. The left testis is non-tender and without masses.  Lymphatics: The femoral and inguinal nodes are not enlarged or tender.  Skin: Normal skin turgor, no visible rash and no visible skin lesions.  Neuro/Psych:. Mood and affect are appropriate.    Results/Data Urine [Data Includes: Last 1 Day]   [id number]  COLOR AMBER   APPEARANCE CLEAR   SPECIFIC GRAVITY 1.015   pH 6.5   GLUCOSE NEG mg/dL  BILIRUBIN NEG   KETONE NEG mg/dL  BLOOD NEG   PROTEIN NEG mg/dL  UROBILINOGEN 0.2 mg/dL  NITRITE NEG   LEUKOCYTE ESTERASE NEG    Procedure  Procedure: Cystoscopy   Informed Consent: Risks, benefits, and potential adverse events were discussed and informed consent was  obtained from the patient.  Prep: The patient was prepped with betadine.  Procedure Note:  Urethral meatus:. No abnormalities.  Anterior urethra: No abnormalities.  Prostatic urethra: No abnormalities.  Bladder: Visulization was clear. The ureteral orifices were in the normal anatomic position bilaterally and had clear efflux of urine. A systematic survey of the bladder demonstrated no bladder tumors or stones. The mucosa was smooth without abnormalities. There was some mild erythema and neovascularity around the right resection site. Did not appear concerning. The patient tolerated the procedure well.  Complications: None.    Assessment Assessed  1. Malignant neoplasm of overlapping sites of bladder (188.8) 2. Prostate cancer (185)  Stable apical nodule/benign DRE today  Cystoscopy   Plan  Health Maintenance  1. UA With REFLEX; [Do Not Release]; Status:Complete;   Done: 40JWJ1914 10:04AM Malignant neoplasm of overlapping sites of bladder  2. Follow-up Month x 3 Office  Follow-up  Status: Hold For - Appointment,Date of Service   Requested for: [phone] 3. Follow-up [place] Procedure Office  Follow-up: 4-6 weeks for BCG maintenance 1/3  Status:  Hold For - Appointment,Date of Service  Requested for: [phone]  URINE CYTOLOGY; Status:In Progress - Specimen/Data Collected;  Done: [id number] Perform:[facility]; Due:22Jun2015; Marked Important; Last Updated KG:MWNUUV, Santiago Glad; 07/25/2013 11:22:26 AM;Ordered; Today;  OZD:GUYQIHKVQ neoplasm of overlapping sites of bladder; Ordered QV:ZDGLOVFI, Analyssa Downs;   Discussion/Summary Bladder cancer-prior biopsy of posterior erythema showed inflammation. I did send Urine Cytology sent - if + will proceed with cysto, bbx, bilateral washes, poss ureteroscopy; otherwise we will resume BCG maintenance in 4-6 weeks.  Prostate cancer-PSA is stable, DRE normal today.   BPH-stable  cc; Dr. Davina Poke Electronically signed by : [name], M.D.;  Jul 25 2013 11:25AM EST    Addendum -  August 08, 2013 patient had left flank pain nausea and vomiting. White count 11.4, BUN 18, creatinine 1.1, UA - 40 red blood cells, 58 white blood cells, no bacteria. Afebrile. Treated with Cipro. CT scan showed stranding around the left peripelvic fat and left ureter with mild ureteral dilation. There were no stones. August 13, 2013 - saw [facility] in office - UA 11-20 rbc, urine Cx negative  Will add left URS given above issues.

## 2013-08-17 ENCOUNTER — Ambulatory Visit (HOSPITAL_BASED_OUTPATIENT_CLINIC_OR_DEPARTMENT_OTHER)
Admission: RE | Admit: 2013-08-17 | Discharge: 2013-08-17 | Disposition: A | Discharge status: Home / Self Care | Payer: Medicare Other | Source: Ambulatory Visit | Attending: Urology | Admitting: Urology

## 2013-08-17 ENCOUNTER — Encounter (HOSPITAL_BASED_OUTPATIENT_CLINIC_OR_DEPARTMENT_OTHER): Payer: Self-pay | Admitting: Anesthesiology

## 2013-08-17 ENCOUNTER — Encounter (HOSPITAL_BASED_OUTPATIENT_CLINIC_OR_DEPARTMENT_OTHER): Payer: Medicare Other | Admitting: Anesthesiology

## 2013-08-17 ENCOUNTER — Ambulatory Visit (HOSPITAL_BASED_OUTPATIENT_CLINIC_OR_DEPARTMENT_OTHER): Payer: Medicare Other | Admitting: Anesthesiology

## 2013-08-17 ENCOUNTER — Encounter (HOSPITAL_BASED_OUTPATIENT_CLINIC_OR_DEPARTMENT_OTHER)
Admission: RE | Disposition: A | Discharge status: Home / Self Care | Payer: Self-pay | Source: Ambulatory Visit | Attending: Urology

## 2013-08-17 DIAGNOSIS — I10 Essential (primary) hypertension: Secondary | ICD-10-CM | POA: Insufficient documentation

## 2013-08-17 DIAGNOSIS — Z79899 Other long term (current) drug therapy: Secondary | ICD-10-CM | POA: Insufficient documentation

## 2013-08-17 DIAGNOSIS — Z6834 Body mass index (BMI) 34.0-34.9, adult: Secondary | ICD-10-CM | POA: Insufficient documentation

## 2013-08-17 DIAGNOSIS — C61 Malignant neoplasm of prostate: Secondary | ICD-10-CM | POA: Insufficient documentation

## 2013-08-17 DIAGNOSIS — N3080 Other cystitis without hematuria: Secondary | ICD-10-CM | POA: Insufficient documentation

## 2013-08-17 DIAGNOSIS — C679 Malignant neoplasm of bladder, unspecified: Secondary | ICD-10-CM | POA: Insufficient documentation

## 2013-08-17 DIAGNOSIS — G4730 Sleep apnea, unspecified: Secondary | ICD-10-CM | POA: Insufficient documentation

## 2013-08-17 DIAGNOSIS — N135 Crossing vessel and stricture of ureter without hydronephrosis: Secondary | ICD-10-CM | POA: Insufficient documentation

## 2013-08-17 HISTORY — DX: Presence of spectacles and contact lenses: Z97.3

## 2013-08-17 HISTORY — PX: CYSTOSCOPY/RETROGRADE/URETEROSCOPY: SHX5316

## 2013-08-17 LAB — POCT I-STAT 4, (NA,K, GLUC, HGB,HCT)
GLUCOSE: 112 mg/dL — AB (ref 70–99)
HCT: 48 % (ref 39.0–52.0)
Hemoglobin: 16.3 g/dL (ref 13.0–17.0)
Potassium: 4.3 mEq/L (ref 3.7–5.3)
Sodium: 138 mEq/L (ref 137–147)

## 2013-08-17 SURGERY — CYSTOSCOPY/RETROGRADE/URETEROSCOPY
Anesthesia: General | Site: Ureter | Laterality: Bilateral

## 2013-08-17 MED ORDER — ONDANSETRON HCL 4 MG/2ML IJ SOLN
INTRAMUSCULAR | Status: DC | PRN
  Administered 2013-08-17: 4 mg via INTRAVENOUS

## 2013-08-17 MED ORDER — SODIUM CHLORIDE 0.9 % IR SOLN
Status: DC | PRN
  Administered 2013-08-17: 6000 mL

## 2013-08-17 MED ORDER — BELLADONNA ALKALOIDS-OPIUM 16.2-60 MG RE SUPP
RECTAL | Status: AC
  Filled 2013-08-17: qty 1

## 2013-08-17 MED ORDER — DEXAMETHASONE SODIUM PHOSPHATE 4 MG/ML IJ SOLN
INTRAMUSCULAR | Status: DC | PRN
  Administered 2013-08-17: 10 mg via INTRAVENOUS

## 2013-08-17 MED ORDER — CEFAZOLIN SODIUM-DEXTROSE 2-3 GM-% IV SOLR
INTRAVENOUS | Status: AC
  Filled 2013-08-17: qty 50

## 2013-08-17 MED ORDER — FENTANYL CITRATE 0.05 MG/ML IJ SOLN
INTRAMUSCULAR | Status: DC | PRN
  Administered 2013-08-17 (×8): 25 ug via INTRAVENOUS

## 2013-08-17 MED ORDER — ACETAMINOPHEN 10 MG/ML IV SOLN
INTRAVENOUS | Status: DC | PRN
  Administered 2013-08-17: 1000 mg via INTRAVENOUS

## 2013-08-17 MED ORDER — PROMETHAZINE HCL 25 MG/ML IJ SOLN
6.2500 mg | INTRAMUSCULAR | Status: DC | PRN
  Filled 2013-08-17: qty 1

## 2013-08-17 MED ORDER — MIDAZOLAM HCL 5 MG/5ML IJ SOLN
INTRAMUSCULAR | Status: DC | PRN
  Administered 2013-08-17 (×2): 1 mg via INTRAVENOUS

## 2013-08-17 MED ORDER — MEPERIDINE HCL 25 MG/ML IJ SOLN
6.2500 mg | INTRAMUSCULAR | Status: DC | PRN
  Filled 2013-08-17: qty 1

## 2013-08-17 MED ORDER — LIDOCAINE HCL (CARDIAC) 20 MG/ML IV SOLN
INTRAVENOUS | Status: DC | PRN
  Administered 2013-08-17: 80 mg via INTRAVENOUS

## 2013-08-17 MED ORDER — PROPOFOL 10 MG/ML IV BOLUS
INTRAVENOUS | Status: DC | PRN
  Administered 2013-08-17: 200 mg via INTRAVENOUS

## 2013-08-17 MED ORDER — FENTANYL CITRATE 0.05 MG/ML IJ SOLN
25.0000 ug | INTRAMUSCULAR | Status: DC | PRN
  Filled 2013-08-17: qty 1

## 2013-08-17 MED ORDER — MIDAZOLAM HCL 2 MG/2ML IJ SOLN
INTRAMUSCULAR | Status: AC
  Filled 2013-08-17: qty 2

## 2013-08-17 MED ORDER — IOHEXOL 350 MG/ML SOLN
INTRAVENOUS | Status: DC | PRN
  Administered 2013-08-17: 10 mL

## 2013-08-17 MED ORDER — EPHEDRINE SULFATE 50 MG/ML IJ SOLN
INTRAMUSCULAR | Status: DC | PRN
  Administered 2013-08-17 (×3): 10 mg via INTRAVENOUS

## 2013-08-17 MED ORDER — FENTANYL CITRATE 0.05 MG/ML IJ SOLN
INTRAMUSCULAR | Status: AC
  Filled 2013-08-17: qty 2

## 2013-08-17 MED ORDER — CEFAZOLIN SODIUM 1-5 GM-% IV SOLN
1.0000 g | INTRAVENOUS | Status: DC
  Filled 2013-08-17: qty 50

## 2013-08-17 MED ORDER — KETOROLAC TROMETHAMINE 30 MG/ML IJ SOLN
INTRAMUSCULAR | Status: DC | PRN
  Administered 2013-08-17: 30 mg via INTRAVENOUS

## 2013-08-17 MED ORDER — LACTATED RINGERS IV SOLN
INTRAVENOUS | Status: DC | PRN
  Administered 2013-08-17 (×2): via INTRAVENOUS

## 2013-08-17 MED ORDER — CEFAZOLIN SODIUM-DEXTROSE 2-3 GM-% IV SOLR
2.0000 g | INTRAVENOUS | Status: AC
  Administered 2013-08-17: 2 g via INTRAVENOUS
  Filled 2013-08-17: qty 50

## 2013-08-17 MED ORDER — LACTATED RINGERS IV SOLN
INTRAVENOUS | Status: DC
  Administered 2013-08-17: 08:00:00 via INTRAVENOUS
  Filled 2013-08-17: qty 1000

## 2013-08-17 SURGICAL SUPPLY — 46 items
ADAPTER CATH URET PLST 4-6FR (CATHETERS) ×3 IMPLANT
ADPR CATH URET STRL DISP 4-6FR (CATHETERS) ×1
BAG DRAIN URO-CYSTO SKYTR STRL (DRAIN) ×6 IMPLANT
BAG DRN UROCATH (DRAIN) ×2
BASKET LASER NITINOL 1.9FR (BASKET) IMPLANT
BASKET STNLS GEMINI 4WIRE 3FR (BASKET) IMPLANT
BASKET ZERO TIP NITINOL 2.4FR (BASKET) IMPLANT
BSKT STON RTRVL 120 1.9FR (BASKET)
BSKT STON RTRVL GEM 120X11 3FR (BASKET)
CANISTER SUCT LVC 12 LTR MEDI- (MISCELLANEOUS) ×3 IMPLANT
CATH FOLEY 2WAY SLVR  5CC 18FR (CATHETERS) ×2
CATH FOLEY 2WAY SLVR 5CC 18FR (CATHETERS) ×1 IMPLANT
CATH INTERMIT  6FR 70CM (CATHETERS) ×9 IMPLANT
CATH URET 5FR 28IN CONE TIP (BALLOONS)
CATH URET 5FR 28IN OPEN ENDED (CATHETERS) IMPLANT
CATH URET 5FR 70CM CONE TIP (BALLOONS) IMPLANT
CLOTH BEACON ORANGE TIMEOUT ST (SAFETY) ×3 IMPLANT
CONT SPECI 4OZ STER CLIK (MISCELLANEOUS) ×9 IMPLANT
CUTTING ELECTRODE ×3 IMPLANT
DRAPE CAMERA CLOSED 9X96 (DRAPES) IMPLANT
ELECT REM PT RETURN 9FT ADLT (ELECTROSURGICAL) ×3
ELECTRODE REM PT RTRN 9FT ADLT (ELECTROSURGICAL) ×1 IMPLANT
GLOVE BIO SURGEON STRL SZ 6.5 (GLOVE) ×2 IMPLANT
GLOVE BIO SURGEON STRL SZ7.5 (GLOVE) ×3 IMPLANT
GLOVE BIO SURGEONS STRL SZ 6.5 (GLOVE) ×1
GLOVE INDICATOR 6.5 STRL GRN (GLOVE) ×6 IMPLANT
GOWN PREVENTION PLUS LG XLONG (DISPOSABLE) IMPLANT
GOWN STRL REIN XL XLG (GOWN DISPOSABLE) IMPLANT
GOWN STRL REUS W/ TWL LRG LVL3 (GOWN DISPOSABLE) ×1 IMPLANT
GOWN STRL REUS W/ TWL XL LVL3 (GOWN DISPOSABLE) ×1 IMPLANT
GOWN STRL REUS W/TWL LRG LVL3 (GOWN DISPOSABLE) ×2
GOWN STRL REUS W/TWL XL LVL3 (GOWN DISPOSABLE) ×3
GUIDEWIRE 0.038 PTFE COATED (WIRE) IMPLANT
GUIDEWIRE ANG ZIPWIRE 038X150 (WIRE) IMPLANT
GUIDEWIRE STR DUAL SENSOR (WIRE) ×3 IMPLANT
HOLDER FOLEY CATH W/STRAP (MISCELLANEOUS) ×3 IMPLANT
IV NS IRRIG 3000ML ARTHROMATIC (IV SOLUTION) ×6 IMPLANT
KIT BALLIN UROMAX 15FX10 (LABEL) IMPLANT
KIT BALLN UROMAX 15FX4 (MISCELLANEOUS) IMPLANT
KIT BALLN UROMAX 26 75X4 (MISCELLANEOUS)
PACK CYSTOSCOPY (CUSTOM PROCEDURE TRAY) ×3 IMPLANT
SET HIGH PRES BAL DIL (LABEL)
SHEATH ACCESS URETERAL 38CM (SHEATH) IMPLANT
SHEATH ACCESS URETERAL 54CM (SHEATH) IMPLANT
SYRINGE IRR TOOMEY STRL 70CC (SYRINGE) IMPLANT
WATER STERILE IRR 3000ML UROMA (IV SOLUTION) ×6 IMPLANT

## 2013-08-17 NOTE — Op Note (Signed)
Preoperative diagnosis: Bladder cancer, positive urine cytology, left ureteral stricture Postoperative diagnosis: Same  Procedure: Exam under anesthesia Cystoscopy with bilateral renal washings and bilateral retrograde pyelograms Random and targeted bladder and prostatic urethral biopsies with fulguration  Surgeon: Junious Silk  Anesthesia: GEn  Findings:  On exam under anesthesia the penis and testicles were normal without mass or lesion. On digital rectal exam the prostate was small benign and had no palpable nodules. I could not palpate any nodule in the apical area. On bimanual exam there are no dominant or bladder masses.   On cystoscopy the urethra appeared normal. In the prostatic urethra around the veru were some small papillary fronds which were biopsied. The remainder the prostate showed mild enlargement with some obstruction. The bladder contained no obvious tumor but there was a small papillary appearing lesion with some calcifications on the tips just medial to the right ureteral orifice on the trigone. And posteriorly there was some erythema around the prior right biopsy site. There was also a very small 1 mm stone in the bladder. This was collected. There were no obvious tumors. The bladder was examined with the 12, 30 and 70 lens. The ureteral orifices appeared normal, the left more patulous than the right. They both exhibited excellent clear efflux.  Right retrograde pyelogram-this outlined a single ureter single collecting system unit without filling defect dilation or stricture. Initially there was some air in the renal pelvis and the ureter which moved around and also washed out on later images.   Left retrograde pyelogram-this outlined a single ureter single collecting system unit without filling defect, dilation or stricture. The ureter tapers near the bladder and it wasn't evident today it was a stricture or close to the ureterovesical junction and the normal tapering.  Nonetheless the left collecting system was not dilated and the left ureter did not appear dilated apart from the dilation down in the left distal ureter which appears stable.   Description of procedure: After consent was obtained patient brought to the operating room. He was placed in lithotomy position after adequate anesthesia and prepped and draped in the usual sterile fashion. A timeout was performed to confirm the patient and procedure. An exam under anesthesia was performed in a B&O suppository placed. The cystoscope was passed per urethra and the bladder inspected with the 30 and 70 lens. Findings as dictated. The 6 [place] open-ended catheter was then passed through the left ureteral orifice it met some slight resistance in the distal ureter and then passed proximally. Urine for cytology was collected from the left side. A retrograde pyelogram was then obtained by injecting contrast of the 6 [place] open-ended catheter. There was brisk drainage of contrast after removing the catheter.  A new 6 French catheter was advanced up the right collecting system. Cytology was obtained by collecting urine from the side. Initially there was no drainage and I injected 5 cc of saline which created a good wash-in drainage. Contrast was then injected in a retrograde fashion as the catheter was withdrawn similar to the left side. Findings as dictated above.   There was excellent contrast efflux from both ureteral orifices following the retrogrades.   Now, the right posterior was biopsied which was targeted to some erythema. Then random of the right, posterior, left biopsy was taken. These were done and fulgurated with the tauber biopsy forceps. Random biopsy of the right prostatic urethra and left prostatic urethra was taken which required the flexible biopsy forceps in the [place]. Next a biopsy  of the tissue adjacent to the veru was taken with the flexible biopsy forceps. This was fulgurated. In last I carefully to  the right trigone biopsy and fulgurated this under local to ensure no encroachment of the right ureteral orifice. There was good hemostasis.   The biopsy sites were all inspected and noted to have excellent hemostasis. The bladder was filled with some fluid and the scope removed. An 56 French Foley was placed for wake up. I'll removed and recovery room.   He was awakened taken to the recovery room in stable condition.   Complications: None  Blood loss: Minimal   Drains: 18 French Foley   Specimens:  For pathology- #1 right posterior bladder biopsy-targeted to some erythema #2 right bladder biopsy-random #3 Posterior bladder biopsy-random  #4 Left bladder biopsy-random #5 right prostatic urethra-random #6 left prostatic urethra-random #7 veru biopsy targeted-small papillary fronds #8 right trigone biopsy targeted-papillary appearing lesion was some calcification on the tips For office lab- Bladder stone

## 2013-08-17 NOTE — Interval H&P Note (Signed)
History and Physical Interval Note:  08/17/2013 8:12 AM  Charles Wright  has presented today for surgery, with the diagnosis of BLADDER CANCER  The various methods of treatment have been discussed with the patient and family. After consideration of risks, benefits and other options for treatment, the patient has consented to  Procedure(s): CYSTOSCOPY, BLADDER BIOPSY WITH BILATERAL RETROGRADE PYELOGRAM, LEFT URETEROSCOPY AND DILATION OF STRICTURE  (Bilateral) as a surgical intervention .  The patient's history has been reviewed, patient examined, no change in status, stable for surgery.  I have reviewed the patient's chart and labs.  I talked to him more about his episode in [place]. He had severe left flank pain of sudden onset, but it resolved before he went to the emergency department. He had no dysuria or fever. We discussed the CT results which showed some inflammation around the left renal pelvis and ureter which we discussed could be benign or malignant. He did have a few very early calcifications on a prior CT and it's possible he passed a small stone. We discussed if either retrograde is abnormal we would want to consider ureteroscopy but have to consider the nature of ureteral strictures to recur and progress which might require reimplant or permanent stent. If retrogrades are normal we may wait to see what the washings showed before being aggressive with the dilating that left stricture. Prior attempts at ureteroscopy of an unsuccessful even after ureteral dilation. Today he is well and has no dysuria fever or chills. He has no flank pain. No gross hematuria. I discussed with the patient the nature, potential benefits, risks and alternatives to the above, including side effects of the proposed treatment, the likelihood of the patient achieving the goals of the procedure, and any potential problems that might occur during the procedure or recuperation. All questions answered. Patient elects to proceed.      [name]

## 2013-08-17 NOTE — Anesthesia Postprocedure Evaluation (Signed)
Anesthesia Post Note  Patient: Charles Wright  Procedure(s) Performed: Procedure(s) (LRB): CYSTOSCOPY, BLADDER BIOPSY WITH BILATERAL RETROGRADE PYELOGRAM, LEFT URETEROSCOPY AND DILATION OF STRICTURE  (Bilateral)  Anesthesia type: General  Patient location: PACU  Post pain: Pain level controlled  Post assessment: Post-op Vital signs reviewed  Last Vitals: BP 121/69  Pulse 91  Temp(Src) 36.2 C (Oral)  Resp 16  Ht 5' 6.5" (1.689 m)  Wt 214 lb (97.07 kg)  BMI 34.03 kg/m2  SpO2 96%  Post vital signs: Reviewed  Level of consciousness: sedated  Complications: No apparent anesthesia complications

## 2013-08-17 NOTE — Anesthesia Preprocedure Evaluation (Addendum)
Anesthesia Evaluation  Patient identified by MRN, date of birth, ID band Patient awake    Reviewed: Allergy & Precautions, H&P , NPO status , Patient's Chart, lab work & pertinent test results  Airway Mallampati: II TM Distance: >3 FB Neck ROM: Full    Dental no notable dental hx.    Pulmonary sleep apnea ,  breath sounds clear to auscultation  Pulmonary exam normal       Cardiovascular hypertension, Pt. on medications Rhythm:Regular Rate:Normal     Neuro/Psych negative neurological ROS  negative psych ROS   GI/Hepatic negative GI ROS, Neg liver ROS,   Endo/Other  Morbid obesity  Renal/GU negative Renal ROS     Musculoskeletal negative musculoskeletal ROS (+)   Abdominal   Peds  Hematology negative hematology ROS (+)   Anesthesia Other Findings   Reproductive/Obstetrics                           Anesthesia Physical  Anesthesia Plan  ASA: II  Anesthesia Plan: General   Post-op Pain Management:    Induction: Intravenous  Airway Management Planned: LMA  Additional Equipment:   Intra-op Plan:   Post-operative Plan:   Informed Consent: I have reviewed the patients History and Physical, chart, labs and discussed the procedure including the risks, benefits and alternatives for the proposed anesthesia with the patient or authorized representative who has indicated his/her understanding and acceptance.   Dental advisory given  Plan Discussed with: CRNA and Surgeon  Anesthesia Plan Comments:         Anesthesia Quick Evaluation

## 2013-08-17 NOTE — Transfer of Care (Signed)
Immediate Anesthesia Transfer of Care Note  Patient: Charles Wright  Procedure(s) Performed: Procedure(s) (LRB): CYSTOSCOPY, BLADDER BIOPSY WITH BILATERAL RETROGRADE PYELOGRAM, LEFT URETEROSCOPY AND DILATION OF STRICTURE  (Bilateral)  Patient Location: PACU  Anesthesia Type: General  Level of Consciousness: awake, sedated, patient cooperative and responds to stimulation  Airway & Oxygen Therapy: Patient Spontanous Breathing and Patient connected to face mask oxygen  Post-op Assessment: Report given to PACU RN, Post -op Vital signs reviewed and stable and Patient moving all extremities  Post vital signs: Reviewed and stable  Complications: No apparent anesthesia complications

## 2013-08-17 NOTE — Discharge Instructions (Signed)
°  Cystoscopy, Care After °Refer to this sheet in the next few weeks. These instructions provide you with information on caring for yourself after your procedure. Your caregiver may also give you more specific instructions. Your treatment has been planned according to current medical practices, but problems sometimes occur. Call your caregiver if you have any problems or questions after your procedure. °HOME CARE INSTRUCTIONS  °Things you can do to ease any discomfort after your procedure include: °· Drinking enough water and fluids to keep your urine clear or pale yellow. °· Taking a warm bath to relieve any burning feelings. °SEEK IMMEDIATE MEDICAL CARE IF:  °· You have an increase in blood in your urine. °· You notice blood clots in your urine. °· You have difficulty passing urine. °· You have the chills. °· You have abdominal pain. °· You have a fever or persistent symptoms for more than 2-3 days. °· You have a fever and your symptoms suddenly get worse. °MAKE SURE YOU:  °· Understand these instructions. °· Will watch your condition. °· Will get help right away if you are not doing well or get worse. °Document Released: 08/14/2004 Document Revised: 09/27/2012 Document Reviewed: 07/19/2011 °[employer]® Patient Information ©2015 [employer]. This information is not intended to replace advice given to you by your health care provider. Make sure you discuss any questions you have with your health care provider. ° °Post Anesthesia Home Care Instructions ° °Activity: °Get plenty of rest for the remainder of the day. A responsible adult should stay with you for 24 hours following the procedure.  °For the next 24 hours, DO NOT: °-Drive a car °-Operate machinery °-Drink alcoholic beverages °-Take any medication unless instructed by your physician °-Make any legal decisions or sign important papers. ° °Meals: °Start with liquid foods such as gelatin or soup. Progress to regular foods as tolerated. Avoid greasy, spicy,  heavy foods. If nausea and/or vomiting occur, drink only clear liquids until the nausea and/or vomiting subsides. Call your physician if vomiting continues. ° °Special Instructions/Symptoms: °Your throat may feel dry or sore from the anesthesia or the breathing tube placed in your throat during surgery. If this causes discomfort, gargle with warm salt water. The discomfort should disappear within 24 hours. ° °

## 2013-08-17 NOTE — Anesthesia Procedure Notes (Signed)
Procedure Name: LMA Insertion Date/Time: 08/17/2013 8:35 AM Performed by: Justice Rocher Pre-anesthesia Checklist: Patient identified, Emergency Drugs available, Suction available and Patient being monitored Patient Re-evaluated:Patient Re-evaluated prior to inductionOxygen Delivery Method: Circle System Utilized Preoxygenation: Pre-oxygenation with 100% oxygen Intubation Type: IV induction Ventilation: Mask ventilation without difficulty LMA: LMA inserted LMA Size: 5.0 Number of attempts: 1 Airway Equipment and Method: bite block Placement Confirmation: positive ETCO2 Tube secured with: Tape Dental Injury: Teeth and Oropharynx as per pre-operative assessment

## 2013-08-20 ENCOUNTER — Encounter (HOSPITAL_BASED_OUTPATIENT_CLINIC_OR_DEPARTMENT_OTHER): Payer: Self-pay | Admitting: Urology

## 2013-11-12 ENCOUNTER — Ambulatory Visit
Admission: RE | Admit: 2013-11-12 | Discharge: 2013-11-12 | Disposition: A | Discharge status: Home / Self Care | Payer: Medicare Other | Source: Ambulatory Visit | Attending: Family Medicine | Admitting: Family Medicine

## 2013-11-12 ENCOUNTER — Other Ambulatory Visit: Payer: Self-pay | Admitting: Family Medicine

## 2013-11-12 DIAGNOSIS — M545 Low back pain: Secondary | ICD-10-CM

## 2013-11-13 ENCOUNTER — Other Ambulatory Visit: Payer: Self-pay | Admitting: Family Medicine

## 2013-11-13 DIAGNOSIS — M545 Low back pain, unspecified: Secondary | ICD-10-CM

## 2013-11-15 ENCOUNTER — Ambulatory Visit
Admission: RE | Admit: 2013-11-15 | Discharge: 2013-11-15 | Disposition: A | Discharge status: Home / Self Care | Payer: Medicare Other | Source: Ambulatory Visit | Attending: Family Medicine | Admitting: Family Medicine

## 2013-11-15 DIAGNOSIS — M545 Low back pain, unspecified: Secondary | ICD-10-CM

## 2013-11-21 ENCOUNTER — Other Ambulatory Visit (HOSPITAL_COMMUNITY): Payer: Self-pay | Admitting: Neurosurgery

## 2013-11-22 ENCOUNTER — Encounter (HOSPITAL_COMMUNITY): Payer: Self-pay | Admitting: Pharmacy Technician

## 2013-11-28 NOTE — Pre-Procedure Instructions (Signed)
Charles Wright  11/28/2013   Your procedure is scheduled on:  Monday, Oct. 26th   Report to [facility] Admitting at 1:30 PM   Call this number if you have problems the morning of surgery: [phone]   Remember:   Do not eat food or drink liquids after midnight Sunday.   Take these medicines the morning of surgery with A SIP OF WATER: Hydrocodone if needed   Do not wear jewelry - no rings or watches.  Do not wear lotions or colognes.   You may NOT wear deodorant the morning of surgery.   Men may shave face and neck.   Do not bring valuables to the hospital.  [facility] is not responsible for any belongings or valuables.               Contacts, dentures or bridgework may not be worn into surgery.  Leave suitcase in the car. After surgery it may be brought to your room.  For patients admitted to the hospital, discharge time is determined by your treatment team.               Name and phone number of your driver:                                                                                                                                                                                                                                                                                                                                        Special Instructions: "Preparing for Surgery" instruction sheet   Please read over the following fact sheets that you were given: Pain Booklet, MRSA Information and Surgical Site Infection Prevention

## 2013-11-29 ENCOUNTER — Encounter (HOSPITAL_COMMUNITY): Payer: Self-pay

## 2013-11-29 ENCOUNTER — Encounter (HOSPITAL_COMMUNITY)
Admission: RE | Admit: 2013-11-29 | Discharge: 2013-11-29 | Disposition: A | Discharge status: Home / Self Care | Payer: Medicare Other | Source: Ambulatory Visit | Attending: Neurosurgery | Admitting: Neurosurgery

## 2013-11-29 DIAGNOSIS — I498 Other specified cardiac arrhythmias: Secondary | ICD-10-CM | POA: Diagnosis not present

## 2013-11-29 DIAGNOSIS — I10 Essential (primary) hypertension: Secondary | ICD-10-CM | POA: Diagnosis not present

## 2013-11-29 DIAGNOSIS — Z01818 Encounter for other preprocedural examination: Secondary | ICD-10-CM | POA: Insufficient documentation

## 2013-11-29 HISTORY — DX: Gastro-esophageal reflux disease without esophagitis: K21.9

## 2013-11-29 LAB — COMPREHENSIVE METABOLIC PANEL
ALBUMIN: 3.4 g/dL — AB (ref 3.5–5.2)
ALT: 25 U/L (ref 0–53)
ANION GAP: 12 (ref 5–15)
AST: 32 U/L (ref 0–37)
Alkaline Phosphatase: 66 U/L (ref 39–117)
BUN: 17 mg/dL (ref 6–23)
CALCIUM: 8.8 mg/dL (ref 8.4–10.5)
CO2: 24 mEq/L (ref 19–32)
CREATININE: 1.03 mg/dL (ref 0.50–1.35)
Chloride: 102 mEq/L (ref 96–112)
GFR calc Af Amer: 79 mL/min — ABNORMAL LOW (ref >90)
GFR calc non Af Amer: 68 mL/min — ABNORMAL LOW (ref >90)
Glucose, Bld: 108 mg/dL — ABNORMAL HIGH (ref 70–99)
Potassium: 4.4 mEq/L (ref 3.7–5.3)
Sodium: 138 mEq/L (ref 137–147)
TOTAL PROTEIN: 6.2 g/dL (ref 6.0–8.3)
Total Bilirubin: 0.7 mg/dL (ref 0.3–1.2)

## 2013-11-29 LAB — CBC
HCT: 42 % (ref 39.0–52.0)
Hemoglobin: 14 g/dL (ref 13.0–17.0)
MCH: 30.3 pg (ref 26.0–34.0)
MCHC: 33.3 g/dL (ref 30.0–36.0)
MCV: 90.9 fL (ref 78.0–100.0)
Platelets: 261 10*3/uL (ref 150–400)
RBC: 4.62 MIL/uL (ref 4.22–5.81)
RDW: 13.8 % (ref 11.5–15.5)
WBC: 8.2 10*3/uL (ref 4.0–10.5)

## 2013-11-29 LAB — SURGICAL PCR SCREEN
MRSA, PCR: NEGATIVE
Staphylococcus aureus: NEGATIVE

## 2013-12-02 MED ORDER — CEFAZOLIN SODIUM-DEXTROSE 2-3 GM-% IV SOLR
2.0000 g | INTRAVENOUS | Status: AC
  Administered 2013-12-03: 2 g via INTRAVENOUS

## 2013-12-03 ENCOUNTER — Encounter (HOSPITAL_COMMUNITY): Payer: Self-pay | Admitting: *Deleted

## 2013-12-03 ENCOUNTER — Ambulatory Visit (HOSPITAL_COMMUNITY): Payer: Medicare Other | Admitting: Certified Registered Nurse Anesthetist

## 2013-12-03 ENCOUNTER — Inpatient Hospital Stay (HOSPITAL_COMMUNITY)
Admission: RE | Admit: 2013-12-03 | Discharge: 2013-12-05 | DRG: 517 | Disposition: A | Discharge status: Home / Self Care | Payer: Medicare Other | Source: Ambulatory Visit | Attending: Neurosurgery | Admitting: Neurosurgery

## 2013-12-03 ENCOUNTER — Ambulatory Visit (HOSPITAL_COMMUNITY): Payer: Medicare Other

## 2013-12-03 ENCOUNTER — Encounter (HOSPITAL_COMMUNITY): Payer: Medicare Other | Admitting: Certified Registered Nurse Anesthetist

## 2013-12-03 ENCOUNTER — Encounter (HOSPITAL_COMMUNITY)
Admission: RE | Disposition: A | Discharge status: Home / Self Care | Payer: Self-pay | Source: Ambulatory Visit | Attending: Neurosurgery

## 2013-12-03 DIAGNOSIS — M48061 Spinal stenosis, lumbar region without neurogenic claudication: Secondary | ICD-10-CM

## 2013-12-03 DIAGNOSIS — H9190 Unspecified hearing loss, unspecified ear: Secondary | ICD-10-CM | POA: Diagnosis present

## 2013-12-03 DIAGNOSIS — Z9119 Patient's noncompliance with other medical treatment and regimen: Secondary | ICD-10-CM | POA: Diagnosis present

## 2013-12-03 DIAGNOSIS — M4806 Spinal stenosis, lumbar region: Secondary | ICD-10-CM | POA: Diagnosis present

## 2013-12-03 DIAGNOSIS — M5116 Intervertebral disc disorders with radiculopathy, lumbar region: Secondary | ICD-10-CM | POA: Diagnosis present

## 2013-12-03 DIAGNOSIS — I10 Essential (primary) hypertension: Secondary | ICD-10-CM | POA: Diagnosis present

## 2013-12-03 DIAGNOSIS — G4733 Obstructive sleep apnea (adult) (pediatric): Secondary | ICD-10-CM | POA: Diagnosis present

## 2013-12-03 DIAGNOSIS — M48062 Spinal stenosis, lumbar region with neurogenic claudication: Secondary | ICD-10-CM | POA: Diagnosis present

## 2013-12-03 DIAGNOSIS — E785 Hyperlipidemia, unspecified: Secondary | ICD-10-CM | POA: Diagnosis present

## 2013-12-03 HISTORY — PX: LUMBAR LAMINECTOMY/DECOMPRESSION MICRODISCECTOMY: SHX5026

## 2013-12-03 SURGERY — LUMBAR LAMINECTOMY/DECOMPRESSION MICRODISCECTOMY 1 LEVEL
Anesthesia: General | Site: Back | Laterality: Left

## 2013-12-03 MED ORDER — PHENOL 1.4 % MT LIQD: 1.0000 | OROMUCOSAL | Status: DC | PRN

## 2013-12-03 MED ORDER — ALUM & MAG HYDROXIDE-SIMETH 200-200-20 MG/5ML PO SUSP
30.0000 mL | Freq: Four times a day (QID) | ORAL | Status: DC | PRN
  Administered 2013-12-04: 30 mL via ORAL
  Filled 2013-12-03: qty 30

## 2013-12-03 MED ORDER — DIAZEPAM 5 MG PO TABS
ORAL_TABLET | ORAL | Status: AC
  Administered 2013-12-03: 5 mg via ORAL
  Filled 2013-12-03: qty 1

## 2013-12-03 MED ORDER — ROCURONIUM BROMIDE 100 MG/10ML IV SOLN
INTRAVENOUS | Status: DC | PRN
  Administered 2013-12-03: 50 mg via INTRAVENOUS

## 2013-12-03 MED ORDER — LACTATED RINGERS IV SOLN
INTRAVENOUS | Status: DC | PRN
  Administered 2013-12-03 (×2): via INTRAVENOUS

## 2013-12-03 MED ORDER — BUPIVACAINE-EPINEPHRINE (PF) 0.5% -1:200000 IJ SOLN
INTRAMUSCULAR | Status: DC | PRN
  Administered 2013-12-03: 10 mL

## 2013-12-03 MED ORDER — ONDANSETRON HCL 4 MG/2ML IJ SOLN
INTRAMUSCULAR | Status: AC
  Filled 2013-12-03: qty 2

## 2013-12-03 MED ORDER — HYDROMORPHONE HCL 1 MG/ML IJ SOLN
INTRAMUSCULAR | Status: AC
  Administered 2013-12-03: 0.5 mg via INTRAVENOUS
  Filled 2013-12-03: qty 1

## 2013-12-03 MED ORDER — ONDANSETRON HCL 4 MG/2ML IJ SOLN: 4.0000 mg | Freq: Once | INTRAMUSCULAR | Status: DC | PRN

## 2013-12-03 MED ORDER — MEPERIDINE HCL 25 MG/ML IJ SOLN: 6.2500 mg | INTRAMUSCULAR | Status: DC | PRN

## 2013-12-03 MED ORDER — MORPHINE SULFATE 2 MG/ML IJ SOLN
1.0000 mg | INTRAMUSCULAR | Status: DC | PRN
  Administered 2013-12-03 – 2013-12-04 (×2): 2 mg via INTRAVENOUS
  Filled 2013-12-03 (×2): qty 1

## 2013-12-03 MED ORDER — SODIUM CHLORIDE 0.9 % IR SOLN
Status: DC | PRN
  Administered 2013-12-03: 17:00:00

## 2013-12-03 MED ORDER — CEFAZOLIN SODIUM-DEXTROSE 2-3 GM-% IV SOLR
2.0000 g | Freq: Three times a day (TID) | INTRAVENOUS | Status: AC
  Administered 2013-12-03 – 2013-12-04 (×2): 2 g via INTRAVENOUS
  Filled 2013-12-03 (×2): qty 50

## 2013-12-03 MED ORDER — FENTANYL CITRATE 0.05 MG/ML IJ SOLN
INTRAMUSCULAR | Status: AC
  Filled 2013-12-03: qty 5

## 2013-12-03 MED ORDER — OXYCODONE HCL 5 MG PO TABS: 5.0000 mg | ORAL_TABLET | Freq: Once | ORAL | Status: DC | PRN

## 2013-12-03 MED ORDER — MENTHOL 3 MG MT LOZG: 1.0000 | LOZENGE | OROMUCOSAL | Status: DC | PRN

## 2013-12-03 MED ORDER — NEOSTIGMINE METHYLSULFATE 10 MG/10ML IV SOLN
INTRAVENOUS | Status: DC | PRN
  Administered 2013-12-03: 5 mg via INTRAVENOUS

## 2013-12-03 MED ORDER — OXYCODONE HCL 5 MG/5ML PO SOLN: 5.0000 mg | Freq: Once | ORAL | Status: DC | PRN

## 2013-12-03 MED ORDER — ACETAMINOPHEN 325 MG PO TABS: 650.0000 mg | ORAL_TABLET | ORAL | Status: DC | PRN

## 2013-12-03 MED ORDER — 0.9 % SODIUM CHLORIDE (POUR BTL) OPTIME
TOPICAL | Status: DC | PRN
  Administered 2013-12-03: 1000 mL

## 2013-12-03 MED ORDER — DOCUSATE SODIUM 100 MG PO CAPS
100.0000 mg | ORAL_CAPSULE | Freq: Two times a day (BID) | ORAL | Status: DC
  Administered 2013-12-03 – 2013-12-05 (×4): 100 mg via ORAL
  Filled 2013-12-03 (×5): qty 1

## 2013-12-03 MED ORDER — LACTATED RINGERS IV SOLN: INTRAVENOUS | Status: DC

## 2013-12-03 MED ORDER — GLYCOPYRROLATE 0.2 MG/ML IJ SOLN
INTRAMUSCULAR | Status: DC | PRN
  Administered 2013-12-03: .8 mg via INTRAVENOUS

## 2013-12-03 MED ORDER — FENTANYL CITRATE 0.05 MG/ML IJ SOLN
INTRAMUSCULAR | Status: DC | PRN
  Administered 2013-12-03 (×2): 50 ug via INTRAVENOUS
  Administered 2013-12-03: 100 ug via INTRAVENOUS
  Administered 2013-12-03: 50 ug via INTRAVENOUS

## 2013-12-03 MED ORDER — DEXTROSE 5 % IV SOLN
10.0000 mg | INTRAVENOUS | Status: DC | PRN
  Administered 2013-12-03: 15 ug/min via INTRAVENOUS

## 2013-12-03 MED ORDER — HYDROMORPHONE HCL 2 MG PO TABS
4.0000 mg | ORAL_TABLET | ORAL | Status: DC | PRN
  Administered 2013-12-04 – 2013-12-05 (×4): 4 mg via ORAL
  Filled 2013-12-03 (×4): qty 2

## 2013-12-03 MED ORDER — ROCURONIUM BROMIDE 50 MG/5ML IV SOLN
INTRAVENOUS | Status: AC
  Filled 2013-12-03: qty 1

## 2013-12-03 MED ORDER — BACITRACIN ZINC 500 UNIT/GM EX OINT
TOPICAL_OINTMENT | CUTANEOUS | Status: DC | PRN
  Administered 2013-12-03: 1 via TOPICAL

## 2013-12-03 MED ORDER — BUPIVACAINE LIPOSOME 1.3 % IJ SUSP
INTRAMUSCULAR | Status: DC | PRN
  Administered 2013-12-03: 20 mL

## 2013-12-03 MED ORDER — MIDAZOLAM HCL 5 MG/5ML IJ SOLN
INTRAMUSCULAR | Status: DC | PRN
  Administered 2013-12-03: 2 mg via INTRAVENOUS

## 2013-12-03 MED ORDER — MIDAZOLAM HCL 2 MG/2ML IJ SOLN
INTRAMUSCULAR | Status: AC
  Filled 2013-12-03: qty 2

## 2013-12-03 MED ORDER — IRBESARTAN 150 MG PO TABS
150.0000 mg | ORAL_TABLET | Freq: Every day | ORAL | Status: DC
  Administered 2013-12-04 – 2013-12-05 (×2): 150 mg via ORAL
  Filled 2013-12-03 (×2): qty 1

## 2013-12-03 MED ORDER — DIAZEPAM 5 MG PO TABS
5.0000 mg | ORAL_TABLET | Freq: Four times a day (QID) | ORAL | Status: DC | PRN
  Administered 2013-12-03 – 2013-12-05 (×4): 5 mg via ORAL
  Filled 2013-12-03 (×3): qty 1

## 2013-12-03 MED ORDER — THROMBIN 5000 UNITS EX SOLR
CUTANEOUS | Status: DC | PRN
  Administered 2013-12-03 (×2): 5000 [IU] via TOPICAL

## 2013-12-03 MED ORDER — ONDANSETRON HCL 4 MG/2ML IJ SOLN: 4.0000 mg | INTRAMUSCULAR | Status: DC | PRN

## 2013-12-03 MED ORDER — ONDANSETRON HCL 4 MG/2ML IJ SOLN
INTRAMUSCULAR | Status: DC | PRN
  Administered 2013-12-03: 4 mg via INTRAVENOUS

## 2013-12-03 MED ORDER — HYDROMORPHONE HCL 1 MG/ML IJ SOLN
0.2500 mg | INTRAMUSCULAR | Status: DC | PRN
  Administered 2013-12-03 (×4): 0.5 mg via INTRAVENOUS

## 2013-12-03 MED ORDER — HEMOSTATIC AGENTS (NO CHARGE) OPTIME
TOPICAL | Status: DC | PRN
  Administered 2013-12-03: 1 via TOPICAL

## 2013-12-03 MED ORDER — PROPOFOL 10 MG/ML IV BOLUS
INTRAVENOUS | Status: DC | PRN
  Administered 2013-12-03: 100 mg via INTRAVENOUS

## 2013-12-03 MED ORDER — HYDROCODONE-ACETAMINOPHEN 5-325 MG PO TABS
1.0000 | ORAL_TABLET | ORAL | Status: DC | PRN
  Administered 2013-12-03 – 2013-12-04 (×2): 2 via ORAL
  Filled 2013-12-03 (×2): qty 2

## 2013-12-03 MED ORDER — LIDOCAINE HCL (CARDIAC) 20 MG/ML IV SOLN
INTRAVENOUS | Status: DC | PRN
  Administered 2013-12-03: 100 mg via INTRAVENOUS

## 2013-12-03 MED ORDER — ACETAMINOPHEN 650 MG RE SUPP: 650.0000 mg | RECTAL | Status: DC | PRN

## 2013-12-03 MED ORDER — PROPOFOL 10 MG/ML IV BOLUS
INTRAVENOUS | Status: AC
  Filled 2013-12-03: qty 20

## 2013-12-03 MED ORDER — LIDOCAINE HCL (CARDIAC) 20 MG/ML IV SOLN
INTRAVENOUS | Status: AC
  Filled 2013-12-03: qty 5

## 2013-12-03 MED ORDER — PHENYLEPHRINE HCL 10 MG/ML IJ SOLN
INTRAMUSCULAR | Status: DC | PRN
  Administered 2013-12-03 (×3): 80 ug via INTRAVENOUS

## 2013-12-03 MED ORDER — CEFAZOLIN SODIUM-DEXTROSE 2-3 GM-% IV SOLR
INTRAVENOUS | Status: AC
  Filled 2013-12-03: qty 50

## 2013-12-03 MED ORDER — LACTATED RINGERS IV SOLN
INTRAVENOUS | Status: DC
  Administered 2013-12-03: 14:00:00 via INTRAVENOUS

## 2013-12-03 SURGICAL SUPPLY — 58 items
APL SKNCLS STERI-STRIP NONHPOA (GAUZE/BANDAGES/DRESSINGS) ×1
BAG DECANTER FOR FLEXI CONT (MISCELLANEOUS) ×3 IMPLANT
BENZOIN TINCTURE PRP APPL 2/3 (GAUZE/BANDAGES/DRESSINGS) ×3 IMPLANT
BLADE CLIPPER SURG (BLADE) ×3 IMPLANT
BRUSH SCRUB EZ PLAIN DRY (MISCELLANEOUS) ×3 IMPLANT
BUR MATCHSTICK NEURO 3.0 LAGG (BURR) ×3 IMPLANT
BUR PRECISION FLUTE 6.0 (BURR) ×3 IMPLANT
CANISTER SUCT 3000ML (MISCELLANEOUS) ×3 IMPLANT
CLOSURE WOUND 1/2 X4 (GAUZE/BANDAGES/DRESSINGS) ×1
CONT SPEC 4OZ CLIKSEAL STRL BL (MISCELLANEOUS) ×3 IMPLANT
DRAPE LAPAROTOMY 100X72X124 (DRAPES) ×3 IMPLANT
DRAPE MICROSCOPE LEICA (MISCELLANEOUS) ×3 IMPLANT
DRAPE POUCH INSTRU U-SHP 10X18 (DRAPES) ×3 IMPLANT
DRAPE SURG 17X23 STRL (DRAPES) ×12 IMPLANT
ELECT BLADE 4.0 EZ CLEAN MEGAD (MISCELLANEOUS) ×3
ELECT REM PT RETURN 9FT ADLT (ELECTROSURGICAL) ×3
ELECTRODE BLDE 4.0 EZ CLN MEGD (MISCELLANEOUS) ×1 IMPLANT
ELECTRODE REM PT RTRN 9FT ADLT (ELECTROSURGICAL) ×1 IMPLANT
EVACUATOR 1/8 PVC DRAIN (DRAIN) ×3 IMPLANT
GAUZE SPONGE 4X4 12PLY STRL (GAUZE/BANDAGES/DRESSINGS) ×3 IMPLANT
GAUZE SPONGE 4X4 16PLY XRAY LF (GAUZE/BANDAGES/DRESSINGS) ×3 IMPLANT
GLOVE BIO SURGEON STRL SZ 6.5 (GLOVE) ×4 IMPLANT
GLOVE BIO SURGEON STRL SZ8.5 (GLOVE) ×3 IMPLANT
GLOVE BIO SURGEONS STRL SZ 6.5 (GLOVE) ×2
GLOVE BIOGEL PI IND STRL 6.5 (GLOVE) ×2 IMPLANT
GLOVE BIOGEL PI INDICATOR 6.5 (GLOVE) ×4
GLOVE ECLIPSE 9.0 STRL (GLOVE) ×3 IMPLANT
GLOVE EXAM NITRILE LRG STRL (GLOVE) ×3 IMPLANT
GLOVE EXAM NITRILE MD LF STRL (GLOVE) IMPLANT
GLOVE EXAM NITRILE XL STR (GLOVE) IMPLANT
GLOVE EXAM NITRILE XS STR PU (GLOVE) IMPLANT
GLOVE SS BIOGEL STRL SZ 8 (GLOVE) ×1 IMPLANT
GLOVE SUPERSENSE BIOGEL SZ 8 (GLOVE) ×2
GOWN STRL REUS W/ TWL LRG LVL3 (GOWN DISPOSABLE) ×1 IMPLANT
GOWN STRL REUS W/ TWL XL LVL3 (GOWN DISPOSABLE) ×2 IMPLANT
GOWN STRL REUS W/TWL 2XL LVL3 (GOWN DISPOSABLE) IMPLANT
GOWN STRL REUS W/TWL LRG LVL3 (GOWN DISPOSABLE) ×3
GOWN STRL REUS W/TWL XL LVL3 (GOWN DISPOSABLE) ×6
KIT BASIN OR (CUSTOM PROCEDURE TRAY) ×3 IMPLANT
KIT ROOM TURNOVER OR (KITS) ×3 IMPLANT
NEEDLE HYPO 21X1.5 SAFETY (NEEDLE) ×3 IMPLANT
NEEDLE HYPO 22GX1.5 SAFETY (NEEDLE) ×3 IMPLANT
NS IRRIG 1000ML POUR BTL (IV SOLUTION) ×3 IMPLANT
PACK LAMINECTOMY NEURO (CUSTOM PROCEDURE TRAY) ×3 IMPLANT
PAD ARMBOARD 7.5X6 YLW CONV (MISCELLANEOUS) ×9 IMPLANT
PATTIES SURGICAL .5 X1 (DISPOSABLE) ×3 IMPLANT
RUBBERBAND STERILE (MISCELLANEOUS) ×6 IMPLANT
SPONGE SURGIFOAM ABS GEL SZ50 (HEMOSTASIS) ×3 IMPLANT
STRIP CLOSURE SKIN 1/2X4 (GAUZE/BANDAGES/DRESSINGS) ×2 IMPLANT
SUT VIC AB 1 CT1 18XBRD ANBCTR (SUTURE) ×1 IMPLANT
SUT VIC AB 1 CT1 8-18 (SUTURE) ×3
SUT VIC AB 2-0 CP2 18 (SUTURE) ×3 IMPLANT
SYR 20CC LL (SYRINGE) ×3 IMPLANT
SYR 20ML ECCENTRIC (SYRINGE) ×3 IMPLANT
TAPE CLOTH SURG 4X10 WHT LF (GAUZE/BANDAGES/DRESSINGS) ×3 IMPLANT
TOWEL OR 17X24 6PK STRL BLUE (TOWEL DISPOSABLE) ×3 IMPLANT
TOWEL OR 17X26 10 PK STRL BLUE (TOWEL DISPOSABLE) ×3 IMPLANT
WATER STERILE IRR 1000ML POUR (IV SOLUTION) ×3 IMPLANT

## 2013-12-03 NOTE — Transfer of Care (Signed)
Immediate Anesthesia Transfer of Care Note  Patient: Charles Wright  Procedure(s) Performed: Procedure(s) with comments: Left Lumbar three-four diskectomy with Lumbar four laminectomy  (Left) - Left Lumbar three-four diskectomy with Lumbar four laminectomy   Patient Location: PACU  Anesthesia Type:General  Level of Consciousness: awake, alert  and oriented  Airway & Oxygen Therapy: Patient Spontanous Breathing  Post-op Assessment: Report given to PACU RN  Post vital signs: Reviewed and stable  Complications: No apparent anesthesia complications

## 2013-12-03 NOTE — Op Note (Signed)
Brief history: The patient is a 78 year old white male who has complained of back and left leg pain consistent with a lumbar radiculopathy. He has failed medical management worked up with a lumbar MRI which demonstrates spinal stenosis at L4-5 and a far lateral herniated disc/stenosis at L3-4 on the left. I discussed the various treatment options with the patient including surgery. He has weighed the risks, benefits, and alternative surgery and decided proceed with surgery.  Preoperative diagnosis: L4-5 spinal stenosis, left L3-4 far lateral herniated disc/stenosis, lumbago, lumbar radiculopathy, neurogenic claudication  Postoperative diagnosis: Same  Procedure: L4 laminectomy with bilateral L3 laminotomies to decompress the bilateral L4 and L5 nerve roots using microdissection; left L3-4 far lateral Intervertebral discectomy/foraminotomy using micro-dissection to decompress the left L3 nerve root  Surgeon: Dr. Earle Gell  Asst.: Dr. Granville Lewis  Anesthesia: Gen. endotracheal  Estimated blood loss: 100 mL  Drains: One medium Hemovac in the epidural space  Complications: None  Description of procedure: The patient was brought to the operating room by the anesthesia team. General endotracheal anesthesia was induced. The patient was turned to the prone position on the Wilson frame. The patient's lumbosacral region was then prepared with Betadine scrub and Betadine solution. Sterile drapes were applied.  I then injected the area to be incised with Marcaine with epinephrine solution. I then used a scalpel to make a linear midline incision over the L3-4 and L4-5 intervertebral disc space. I then used electrocautery to perform a bilateral subperiosteal dissection exposing the spinous process and lamina of L3, L4 and L5. We obtained intraoperative radiograph to confirm our location. I then inserted the [facility] retractor for exposure. I used a scalpel to incise the interspinous ligament at L3-4 and  L4-5. I used a Leksell rongeur to remove the spinous process of L4 and the caudal aspect of the L3 spinous process as well as the cephalad aspect of the L5 spinous process.  We then brought the operative microscope into the field. Under its magnification and illumination we completed the microdissection. I used a high-speed drill to perform a laminotomy at L3 and L4 bilaterally. I then used a Kerrison punches to complete the L4 laminectomy and to widen the laminotomies at L3 bilaterally and removed the ligamentum flavum at L3-4 and L4-5. We then used microdissection to free up the thecal sac and the bilateral L4 and L5 nerve root from the epidural tissue. I then used a Kerrison punch to perform a foraminotomy at about the bilateral L4 and L5 nerve root. We inspected the intervertebral disc at L4-5 and L3-4. There were bulging but there were no significant herniations. We did not perform a discectomy.  We now turned our attention to the far lateral discectomy/foraminotomy at L3-4 on the left. I used let to cautery to expose the left L3 pars. Under the magnification and illumination of the microscope I used a high-speed drill to remove the lateral aspect of left L3 pars. I then used microdissection to dissected through the interspinous ligament. I removed the interspinous ligament with the Kerrison punch. We identified the exiting L3 nerve root. We performed a foraminotomy about the nerve root with the Kerrison punches. I then carefully dissected around the nerve root and noted the disc was bulging a bit but not significantly herniated. We did not perform a intervertebral discectomy.  I then palpated along the ventral surface of the thecal sac and along exit route of the left L3 and bilateral L4 and L5 nerve root and noted that the  neural structures were well decompressed. This completed the decompression.  We then obtained hemostasis using bipolar electrocautery. We irrigated the wound out with bacitracin  solution. We then removed the retractor. I placed a medium Hemovac drain in the epidural space and tunneled it out through a separate stab wound. We then reapproximated the patient's thoracolumbar fascia with interrupted #1 Vicryl suture. We then reapproximated the patient's subcutaneous tissue with interrupted 2-0 Vicryl suture. We then reapproximated patient's skin with Steri-Strips and benzoin. The was then coated with bacitracin ointment. The drapes were removed. The patient was subsequently returned to the supine position where they were extubated by the anesthesia team. The patient was then transported to the postanesthesia care unit in stable condition. All sponge instrument and needle counts were reportedly correct at the end of this case.

## 2013-12-03 NOTE — Anesthesia Preprocedure Evaluation (Addendum)
Anesthesia Evaluation  Patient identified by MRN, date of birth, ID band Patient awake    Reviewed: Allergy & Precautions, H&P , NPO status , Patient's Chart, lab work & pertinent test results  Airway Mallampati: II  TM Distance: >3 FB Neck ROM: Full    Dental   Pulmonary sleep apnea ,          Cardiovascular hypertension, Pt. on medications Rhythm:Regular Rate:Normal     Neuro/Psych    GI/Hepatic GERD-  Medicated and Controlled,  Endo/Other    Renal/GU      Musculoskeletal   Abdominal (+)  Abdomen: soft.    Peds  Hematology   Anesthesia Other Findings   Reproductive/Obstetrics                           Anesthesia Physical Anesthesia Plan  ASA: III  Anesthesia Plan: General   Post-op Pain Management:    Induction: Intravenous  Airway Management Planned: Oral ETT  Additional Equipment:   Intra-op Plan:   Post-operative Plan: Extubation in OR  Informed Consent: I have reviewed the patients History and Physical, chart, labs and discussed the procedure including the risks, benefits and alternatives for the proposed anesthesia with the patient or authorized representative who has indicated his/her understanding and acceptance.   Dental advisory given  Plan Discussed with: Surgeon and CRNA  Anesthesia Plan Comments:        Anesthesia Quick Evaluation

## 2013-12-03 NOTE — Anesthesia Postprocedure Evaluation (Signed)
  Anesthesia Post-op Note  Patient: Charles Wright  Procedure(s) Performed: Procedure(s) with comments: Left Lumbar three-four diskectomy with Lumbar four laminectomy  (Left) - Left Lumbar three-four diskectomy with Lumbar four laminectomy   Patient Location: PACU  Anesthesia Type:General  Level of Consciousness: awake, alert  and oriented  Airway and Oxygen Therapy: Patient Spontanous Breathing and Patient connected to nasal cannula oxygen  Post-op Pain: mild  Post-op Assessment: Post-op Vital signs reviewed, Patient's Cardiovascular Status Stable, Respiratory Function Stable, Patent Airway, No signs of Nausea or vomiting and Pain level controlled  Post-op Vital Signs: stable  Last Vitals:  Filed Vitals:   12/03/13 1945  BP:   Pulse: 79  Temp: 36.6 C  Resp: 12    Complications: No apparent anesthesia complications

## 2013-12-03 NOTE — H&P (Signed)
Subjective: The patient is a 78 year old white male who has complained of back and left leg pain. He has failed medical management. He was worked up with a lumbar MRI which demonstrates spinal stenosis at L4-5 and a far lateral herniated disc at L3-4 on the left. I discussed the various treatment options with the patient including surgery. He has decided to proceed with surgery.   Past Medical History  Diagnosis Date  . Hypertension   . Hyperlipidemia   . Bladder cancer   . Gilbert's syndrome   . Wears glasses   . OSA (obstructive sleep apnea)     2011 -- POSITIVE STUDY W/ CPAP RX BUT NON-COMPLIANT  . Chronic kidney disease     stones  "they come and go"  . GERD (gastroesophageal reflux disease)     takes  OTC periodically  . Hearing loss of both ears     bilateral hearing aids    Past Surgical History  Procedure Laterality Date  . Orif left ankle fx  2005  . Bilateral inguinal hernia repair    . Cysto/ left retrograde pyelogram/ ureteroscopy / brush bx/ stent placement  05-01-2008  [facility]  . Cysto/ bladder bx's/ left ureteral stricture dilation/ right retrograde pyelogram/ left stent placement  05-15-2008  DR [facility]  . Transurethral resection of bladder tumor  10/22/2011    Procedure: TRANSURETHRAL RESECTION OF BLADDER TUMOR (TURBT);  Surgeon: Fredricka Bonine, MD;  Location[facility];  Service: Urology;  Laterality: N/A;  TURBT, LEFT URETEROSCOPY, POSSIBLE URETERAL STENT C-ARM   . Ureteroscopy  10/22/2011    Procedure: URETEROSCOPY;  Surgeon: Fredricka Bonine, MD;  Location: [facility];  Service: Urology;  Laterality: Left;  . Prostate biopsy N/A 08/22/2012    Procedure: BIOPSY TRANSRECTAL ULTRASONIC PROSTATE (TUBP);  Surgeon: Fredricka Bonine, MD;  Location: [facility];  Service: Urology;  Laterality: N/A;  . Cystoscopy w/ retrogrades Left 08/22/2012    Procedure: CYSTOSCOPY WITH RETROGRADE PYELOGRAM;   left kidney washings;  Surgeon: Fredricka Bonine, MD;  Location: [facility];  Service: Urology;  Laterality: Left;  . Cystoscopy with biopsy  08/22/2012    Procedure: CYSTOSCOPY WITH BIOPSY;  Surgeon: Fredricka Bonine, MD;  Location: [facility];  Service: Urology;;  . Cystoscopy/retrograde/ureteroscopy Bilateral 08/17/2013    Procedure: CYSTOSCOPY, BLADDER BIOPSY WITH BILATERAL RETROGRADE PYELOGRAM, LEFT URETEROSCOPY AND DILATION OF STRICTURE ;  Surgeon: [name], MD;  Location: [facility];  Service: Urology;  Laterality: Bilateral;  . Fracture surgery      left leg  . Hernia repair      bilateral inguinals    Allergies  Allergen Reactions  . Bee Venom Anaphylaxis  . Oxycodone Other (See Comments)    ALTERED MENTAL STATIS    History  Substance Use Topics  . Smoking status: Never Smoker   . Smokeless tobacco: Never Used  . Alcohol Use: 8.4 oz/week    14 Glasses of wine per week     Comment: red wine    Family History  Problem Relation Age of Onset  . Leukemia Mother    Prior to Admission medications   Medication Sig Start Date End Date Taking? Authorizing Provider  Cholecalciferol (VITAMIN D-3) 1000 UNITS CAPS Take 1,000 Units by mouth daily.    Yes Historical Provider, MD  Glucosamine Sulfate (DONA PO) Take 1 tablet by mouth 2 (two) times daily.    Yes Historical Provider, MD  HYDROcodone-acetaminophen (  NORCO/VICODIN) 5-325 MG per tablet Take 1-2 tablets by mouth every 6 (six) hours as needed for moderate pain.    Yes Historical Provider, MD  ibuprofen (ADVIL,MOTRIN) 200 MG tablet Take 200 mg by mouth every 6 (six) hours as needed (pain).    Yes Historical Provider, MD  Multiple Vitamin (MULTIVITAMIN) tablet Take 1 tablet by mouth 2 (two) times daily.    Yes Historical Provider, MD  naproxen (NAPROSYN) 500 MG tablet Take 500 mg by mouth 2 (two) times daily with a meal.   Yes Historical Provider, MD  olmesartan  (BENICAR) 20 MG tablet Take 20 mg by mouth every morning. 01/26/13  Yes Tanda Rockers, MD  traMADol (ULTRAM) 50 MG tablet Take 50 mg by mouth every 6 (six) hours as needed (pain).   Yes Historical Provider, MD     Review of Systems  Positive ROS: As above  All other systems have been reviewed and were otherwise negative with the exception of those mentioned in the HPI and as above.  Objective: Vital signs in last 24 hours:    General Appearance: Alert, cooperative, no distress, Head: Normocephalic, without obvious abnormality, atraumatic Eyes: PERRL, conjunctiva/corneas clear, EOM's intact,    Ears: Normal  Throat: Normal  Neck: Supple, symmetrical, trachea midline, no adenopathy; thyroid: No enlargement/tenderness/nodules; no carotid bruit or JVD Back: Symmetric, no curvature, ROM normal, no CVA tenderness Lungs: Clear to auscultation bilaterally, respirations unlabored Heart: Regular rate and rhythm, no murmur, rub or gallop Abdomen: Soft, non-tender,, no masses, no organomegaly Extremities: Extremities normal, atraumatic, no cyanosis or edema Pulses: 2+ and symmetric all extremities Skin: Skin color, texture, turgor normal, no rashes or lesions  NEUROLOGIC:   Mental status: alert and oriented, no aphasia, good attention span, Fund of knowledge/ memory ok Motor Exam - grossly normal Sensory Exam - grossly normal he has numbness in the left L3 distribution Reflexes:  Coordination - grossly normal Gait - grossly normal Balance - grossly normal Cranial Nerves: I: smell Not tested  II: visual acuity  OS: Normal  OD: Normal   II: visual fields Full to confrontation  II: pupils Equal, round, reactive to light  III,VII: ptosis None  III,IV,VI: extraocular muscles  Full ROM  V: mastication Normal  V: facial light touch sensation  Normal  V,VII: corneal reflex  Present  VII: facial muscle function - upper  Normal  VII: facial muscle function - lower Normal  VIII: hearing  Not tested  IX: soft palate elevation  Normal  IX,X: gag reflex Present  XI: trapezius strength  5/5  XI: sternocleidomastoid strength 5/5  XI: neck flexion strength  5/5  XII: tongue strength  Normal    Data Review Lab Results  Component Value Date   WBC 8.2 11/29/2013   HGB 14.0 11/29/2013   HCT 42.0 11/29/2013   MCV 90.9 11/29/2013   PLT 261 11/29/2013   Lab Results  Component Value Date   NA 138 11/29/2013   K 4.4 11/29/2013   CL 102 11/29/2013   CO2 24 11/29/2013   BUN 17 11/29/2013   CREATININE 1.03 11/29/2013   GLUCOSE 108* 11/29/2013   No results found for this basename: INR, PROTIME    Assessment/Plan: Left L3-4 far lateral herniated disc, L4-5 spinal stenosis, lumbago, lumbar radiculopathy, neurogenic claudication: I have discussed the situation with the patient. I have reviewed imaging studies with him and pointed out the abnormalities. We have discussed the various treatment options including surgery. I have described the surgical treatment option  of a left L3-4 far lateral discectomy with an L4-5 laminectomy. I have shown him surgical bowels. We have discussed the risks, benefits, alternatives, and likelihood of achieving goals with surgery. I have answered all the patient's questions. He has decided to proceed with surgery.   Akiva Brassfield D 12/03/2013 3:17 PM

## 2013-12-03 NOTE — Progress Notes (Signed)
Subjective:  The patient is somnolent but arousable. He is in no apparent distress.  Objective: Vital signs in last 24 hours: Temp:  [97.5 F (36.4 C)] 97.5 F (36.4 C) (10/26 1814) Pulse Rate:  [97] 97 (10/26 1814) Resp:  [18] 18 (10/26 1814) BP: (126)/(65) 126/65 mmHg (10/26 1814) SpO2:  [99 %] 99 % (10/26 1814)  Intake/Output from previous day:   Intake/Output this shift: Total I/O In: 1400 [I.V.:1400] Out: 100 [Blood:100]  Physical exam the patient is somnolent but arousable. He is moving his lower extremities well.  Lab Results: No results found for this basename: WBC, HGB, HCT, PLT,  in the last 72 hours BMET No results found for this basename: NA, K, CL, CO2, GLUCOSE, BUN, CREATININE, CALCIUM,  in the last 72 hours  Studies/Results: Dg Lumbar Spine 1 View  12/03/2013   CLINICAL DATA:  L3-4 laminectomy, history of lumbar stenosis  EXAM: LUMBAR SPINE - 1 VIEW  COMPARISON:  11/15/2013  FINDINGS: A surgical instrument is noted just inferior to the L3-4 interspace. Surgical retractors and retracts bones are noted as well. An old compression deformity of L1 is seen. Mild degenerative changes are noted.  IMPRESSION: Intraoperative localization at L3-4. The numbering nomenclature is similar to that used on recent MRI examination.   Electronically Signed   By: Inez Catalina M.D.   On: 12/03/2013 16:45    Assessment/Plan: The patient is doing well.  LOS: 0 days     Korin Hartwell D 12/03/2013, 6:16 PM

## 2013-12-04 MED ORDER — GABAPENTIN 300 MG PO CAPS
300.0000 mg | ORAL_CAPSULE | Freq: Three times a day (TID) | ORAL | Status: DC
  Administered 2013-12-04 – 2013-12-05 (×4): 300 mg via ORAL
  Filled 2013-12-04 (×6): qty 1

## 2013-12-04 MED ORDER — ETODOLAC 200 MG PO CAPS
400.0000 mg | ORAL_CAPSULE | Freq: Two times a day (BID) | ORAL | Status: DC
  Administered 2013-12-04 – 2013-12-05 (×3): 400 mg via ORAL
  Filled 2013-12-04 (×4): qty 2

## 2013-12-04 NOTE — Progress Notes (Signed)
Utilization review completed.  

## 2013-12-04 NOTE — Progress Notes (Signed)
Patient ID: Charles Wright, male   DOB: 1935-04-19, 78 y.o.   MRN: [id number] Subjective:  The patient is alert and pleasant. He is having some left groin pain.  Objective: Vital signs in last 24 hours: Temp:  [97.5 F (36.4 C)-98.6 F (37 C)] 98.4 F (36.9 C) (10/27 0341) Pulse Rate:  [74-97] 89 (10/27 0341) Resp:  [12-26] 19 (10/27 0341) BP: (96-141)/(54-77) 141/77 mmHg (10/27 0341) SpO2:  [93 %-100 %] 100 % (10/27 0341)  Intake/Output from previous day: 10/26 0701 - 10/27 0700 In: 1400 [I.V.:1400] Out: 591 [Urine:301; Drains:190; Blood:100] Intake/Output this shift:    Physical exam the patient is alert and oriented. His strength is normal his lower extremities.  Lab Results: No results found for this basename: WBC, HGB, HCT, PLT,  in the last 72 hours BMET No results found for this basename: NA, K, CL, CO2, GLUCOSE, BUN, CREATININE, CALCIUM,  in the last 72 hours  Studies/Results: Dg Lumbar Spine 1 View  12/03/2013   CLINICAL DATA:  L3-4 laminectomy, history of lumbar stenosis  EXAM: LUMBAR SPINE - 1 VIEW  COMPARISON:  11/15/2013  FINDINGS: A surgical instrument is noted just inferior to the L3-4 interspace. Surgical retractors and retracts bones are noted as well. An old compression deformity of L1 is seen. Mild degenerative changes are noted.  IMPRESSION: Intraoperative localization at L3-4. The numbering nomenclature is similar to that used on recent MRI examination.   Electronically Signed   By: Inez Catalina M.D.   On: 12/03/2013 16:45    Assessment/Plan: Postop day #1: We will mobilize the patient. We will discontinue the Hemovac drain and Hep-Lock the IV. I will start Lodine and Neurontin. He will likely go home tomorrow.  LOS: 1 day     Makynzie Dobesh D 12/04/2013, 7:52 AM

## 2013-12-04 NOTE — Evaluation (Signed)
Physical Therapy Evaluation Patient Details Name: Charles Wright MRN: [id number] DOB: 07-Jun-1935 Today's Date: 12/04/2013   History of Present Illness  Pt admitted for L3-5 laminectomy, discectomy due to back and left leg pain  Clinical Impression  Pt moving well but limited by anterior left thigh pain with gait. Pt educated for all precautions and restrictions for transfers, mobility, ADLs and modifications to maintain precautions. Pt with all necessary DME at home and assist of family. Will continue to follow acutely for therapy to maximize pt ability with transfers, gait and activity tolerance adhering to precautions to increase pt independence.     Follow Up Recommendations No PT follow up (pt denied HHPT)    Equipment Recommendations  None recommended by PT    Recommendations for Other Services       Precautions / Restrictions Precautions Precautions: Back Precaution Booklet Issued: Yes (comment)      Mobility  Bed Mobility Overal bed mobility: Needs Assistance Bed Mobility: Rolling;Sidelying to Sit Rolling: Supervision Sidelying to sit: Supervision       General bed mobility comments: cues for sequence and precautions  Transfers Overall transfer level: Needs assistance   Transfers: Sit to/from Stand Sit to Stand: Min assist         General transfer comment: cues for posture and hand placement  Ambulation/Gait Ambulation/Gait assistance: Min guard Ambulation Distance (Feet): 180 Feet Assistive device: Rolling walker (2 wheeled);None Gait Pattern/deviations: Step-through pattern;Decreased stride length;Decreased stance time - left   Gait velocity interpretation: Below normal speed for age/gender General Gait Details: pt initiated gait with step through pattern without DME with increasing pain after 57' with need for RW and guarding for return to room   Stairs Stairs:  (unable due to pain today)          Wheelchair Mobility    Modified Rankin  (Stroke Patients Only)       Balance Overall balance assessment: Needs assistance   Sitting balance-Leahy Scale: Good       Standing balance-Leahy Scale: Fair                               Pertinent Vitals/Pain Pain Assessment: 0-10 Pain Score: 9  Pain Location: left anterior thigh in L2-3 distribution Pain Descriptors / Indicators: Throbbing Pain Intervention(s): Limited activity within patient's tolerance;Premedicated before session;Repositioned;Patient requesting pain meds-RN notified    Home Living Family/patient expects to be discharged to:: Private residence Living Arrangements: Spouse/significant other Available Help at Discharge: Family;Available 24 hours/day Type of Home: House Home Access: Stairs to enter   [employer] of Steps: 2 Home Layout: One level Home Equipment: Walker - 2 wheels;Bedside commode      Prior Function Level of Independence: Independent               Hand Dominance        Extremity/Trunk Assessment   Upper Extremity Assessment: Overall WFL for tasks assessed           Lower Extremity Assessment: LLE deficits/detail;RLE deficits/detail RLE Deficits / Details: 5/5 all myotomes with intact sensation LLE Deficits / Details: 3/5 hip flexion limited by pain, all other myotomes 5/5 with intact sensation  Cervical / Trunk Assessment: Other exceptions  Communication   Communication: No difficulties  Cognition Arousal/Alertness: Awake/alert Behavior During Therapy: WFL for tasks assessed/performed Overall Cognitive Status: Within Functional Limits for tasks assessed  General Comments      Exercises        Assessment/Plan    PT Assessment Patient needs continued PT services  PT Diagnosis Difficulty walking;Acute pain   PT Problem List Decreased strength;Decreased activity tolerance;Decreased mobility;Pain;Decreased knowledge of use of DME;Decreased knowledge of  precautions  PT Treatment Interventions Gait training;Stair training;Functional mobility training;Therapeutic activities;Patient/family education;DME instruction   PT Goals (Current goals can be found in the Care Plan section) Acute Rehab PT Goals Patient Stated Goal: 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) PT Goal Formulation: With patient Time For Goal Achievement: 12/11/13 Potential to Achieve Goals: Good    Frequency Min 5X/week   Barriers to discharge        Co-evaluation               End of Session   Activity Tolerance: Patient limited by pain Patient left: in chair;with call bell/phone within reach Nurse Communication: Mobility status;Precautions;Patient requests pain meds         Time: 6226-3335 PT Time Calculation (min): 21 min   Charges:   PT Evaluation $Initial PT Evaluation Tier I: 1 Procedure PT Treatments $Therapeutic Activity: 8-22 mins   PT G Codes:          Melford Aase 12/04/2013, 8:55 AM Elwyn Reach, [place]

## 2013-12-05 ENCOUNTER — Encounter (HOSPITAL_COMMUNITY): Payer: Self-pay | Admitting: Neurosurgery

## 2013-12-05 MED ORDER — DIAZEPAM 5 MG PO TABS: 5.0000 mg | ORAL_TABLET | Freq: Four times a day (QID) | ORAL | Status: DC | PRN

## 2013-12-05 MED ORDER — HYDROMORPHONE HCL 4 MG PO TABS: 4.0000 mg | ORAL_TABLET | ORAL | Status: DC | PRN

## 2013-12-05 MED ORDER — DSS 100 MG PO CAPS: 100.0000 mg | ORAL_CAPSULE | Freq: Two times a day (BID) | ORAL | Status: DC

## 2013-12-05 MED ORDER — ETODOLAC 200 MG PO CAPS: 400.0000 mg | ORAL_CAPSULE | Freq: Two times a day (BID) | ORAL | Status: DC

## 2013-12-05 MED ORDER — GABAPENTIN 300 MG PO CAPS: 300.0000 mg | ORAL_CAPSULE | Freq: Three times a day (TID) | ORAL | Status: DC

## 2013-12-05 NOTE — Progress Notes (Signed)
Physical Therapy Treatment Patient Details Name: Charles Wright MRN: [id number] DOB: 1935-11-13 Today's Date: 12/05/2013    History of Present Illness Pt admitted for L3-5 laminectomy, discectomy due to back and left leg pain    PT Comments    Pt progressing towards goals. Pt able to amb 360' without AD and navigate stairs using railing on L with guarding. Precautions reviewed, pt receptive.   Follow Up Recommendations  No PT follow up;Supervision for mobility/OOB     Equipment Recommendations  None recommended by PT    Recommendations for Other Services       Precautions / Restrictions Precautions Precautions: Back Precaution Booklet Issued: Yes (comment) Restrictions Weight Bearing Restrictions: No    Mobility  Bed Mobility               General bed mobility comments: pt received sitting EOB  Transfers Overall transfer level: Needs assistance   Transfers: Sit to/from Stand Sit to Stand: Min guard         General transfer comment: guard for safety. no assist to power up. no cues required  Ambulation/Gait Ambulation/Gait assistance: Min guard Ambulation Distance (Feet): 360 Feet Assistive device: None Gait Pattern/deviations: Step-through pattern;Decreased stride length;Decreased stance time - left;Decreased step length - right;Antalgic; Wide base of support   Gait velocity interpretation: Below normal speed for age/gender General Gait Details: Pt able to amb without use of AD. guarding for safety due to instability with ambulation. Pt occassionally wall walks once pain increased.  Pain increased after about 80', but able to continue walking without AD or increase in assistance.    Stairs Stairs: Yes Stairs assistance: Min guard Stair Management: One rail Right;Alternating pattern;Forwards Number of Stairs: 4 General stair comments: No assistance or instability. guarding for safety  Wheelchair Mobility    Modified Rankin (Stroke Patients Only)        Balance Overall balance assessment: Needs assistance   Sitting balance-Leahy Scale: Good       Standing balance-Leahy Scale: Fair                      Cognition Arousal/Alertness: Awake/alert Behavior During Therapy: WFL for tasks assessed/performed Overall Cognitive Status: Within Functional Limits for tasks assessed                      Exercises      General Comments General comments (skin integrity, edema, etc.): Pt mobilizign well. Pain increased with mobility, but per pt report, less than yesterday. Pt not interested in using AD. Pt reports family will be around to supervise and assist as needed at d/c. precautions reviewed prior to mobilizing. Emphasized need for supervision/guarding for stairs.       Pertinent Vitals/Pain Pain Assessment: Faces Faces Pain Scale: Hurts even more (with ambulation) Pain Location: L ant thigh Pain Descriptors / Indicators: Discomfort;Grimacing;Guarding Pain Intervention(s): Limited activity within patient's tolerance;Monitored during session (rest breaks during amb as needed)    Home Living                      Prior Function            PT Goals (current goals can now be found in the care plan section) Acute Rehab PT Goals Patient Stated Goal: to go home Progress towards PT goals: Progressing toward goals    Frequency  Min 5X/week    PT Plan Current plan remains appropriate    Co-evaluation  End of Session Equipment Utilized During Treatment: Gait belt Activity Tolerance: Patient tolerated treatment well;Patient limited by pain Patient left: in chair;with call bell/phone within reach     Time: 0840-0852 PT Time Calculation (min): 12 min  Charges:  $Gait Training: 8-22 mins                    G Codes:      Dori Devino 12/05/2013, 10:26 AM Levonne Hubert, SPT

## 2013-12-05 NOTE — Progress Notes (Signed)
Patient alert and oriented, mae's well, voiding adequate amount of urine, swallowing without difficulty, no c/o pain. Patient discharged home with family. Script and discharged instructions given to patient. Patient and family stated understanding of d/c instructions given and has an appointment with MD. Aisha Symphany Fleissner RN 

## 2013-12-05 NOTE — Progress Notes (Signed)
Reviewed and agree with current POC. Alben Deeds, [place] DPT  [phone]

## 2013-12-05 NOTE — Discharge Summary (Signed)
Physician Discharge Summary  Patient ID: Charles Wright MRN: [id number] DOB/AGE: 1935/10/12 78 y.o.  Admit date: 12/03/2013 Discharge date: 12/05/2013  Admission Diagnoses: L4-5 spinal stenosis, left L3-4 far lateral herniated disc, lumbago, lumbar radiculopathy, neurogenic claudication  Discharge Diagnoses: The same Active Problems:   Lumbar stenosis with neurogenic claudication   Discharged Condition: good  Hospital Course: I performed a L45 laminectomy with left L3-4 discectomy on the patient on 12/03/2013. The surgery went well.  The patient's postoperative course was remarkable for some left groin pain after surgery. We treated this with Lodine and Neurontin. His symptoms resolved.  On postoperative day #2, i.e. 11/27/2013 the patient, and his wife, requesting discharge to home. They were given oral and written discharge instructions.  Consults: PT Significant Diagnostic Studies: None Treatments: L4-5 laminectomy, L3-4 far lateral discectomy Discharge Exam: Blood pressure 123/62, pulse 90, temperature 98.3 F (36.8 C), temperature source Oral, resp. rate 18, SpO2 93.00%. The patient is alert and pleasant. His strength is normal. He looks well. His dressing is clean and dry.  Disposition: Home  Discharge Instructions   Call MD for:  difficulty breathing, headache or visual disturbances    Complete by:  As directed      Call MD for:  extreme fatigue    Complete by:  As directed      Call MD for:  hives    Complete by:  As directed      Call MD for:  persistant dizziness or light-headedness    Complete by:  As directed      Call MD for:  persistant nausea and vomiting    Complete by:  As directed      Call MD for:  redness, tenderness, or signs of infection (pain, swelling, redness, odor or green/yellow discharge around incision site)    Complete by:  As directed      Call MD for:  severe uncontrolled pain    Complete by:  As directed      Call MD for:  temperature  >100.4    Complete by:  As directed      Diet - low sodium heart healthy    Complete by:  As directed      Discharge instructions    Complete by:  As directed   Call [phone] for a followup appointment. Take a stool softener while you are using pain medications.     Driving Restrictions    Complete by:  As directed   Do not drive for 2 weeks.     Increase activity slowly    Complete by:  As directed      Lifting restrictions    Complete by:  As directed   Do not lift more than 5 pounds. No excessive bending or twisting.     May shower / Bathe    Complete by:  As directed   He may shower after the pain she is removed 3 days after surgery. Leave the incision alone.     Remove dressing in 24 hours    Complete by:  As directed             Medication List    STOP taking these medications       HYDROcodone-acetaminophen 5-325 MG per tablet  Commonly known as:  NORCO/VICODIN     ibuprofen 200 MG tablet  Commonly known as:  ADVIL,MOTRIN     naproxen 500 MG tablet  Commonly known as:  NAPROSYN     traMADol 50  MG tablet  Commonly known as:  ULTRAM      TAKE these medications       diazepam 5 MG tablet  Commonly known as:  VALIUM  Take 1 tablet (5 mg total) by mouth every 6 (six) hours as needed for muscle spasms.     DONA PO  Take 1 tablet by mouth 2 (two) times daily.     DSS 100 MG Caps  Take 100 mg by mouth 2 (two) times daily.     etodolac 200 MG capsule  Commonly known as:  LODINE  Take 2 capsules (400 mg total) by mouth 2 (two) times daily.     gabapentin 300 MG capsule  Commonly known as:  NEURONTIN  Take 1 capsule (300 mg total) by mouth 3 (three) times daily.     HYDROmorphone 4 MG tablet  Commonly known as:  DILAUDID  Take 1 tablet (4 mg total) by mouth every 4 (four) hours as needed for severe pain.     multivitamin tablet  Take 1 tablet by mouth 2 (two) times daily.     olmesartan 20 MG tablet  Commonly known as:  BENICAR  Take 20 mg by  mouth every morning.     Vitamin D-3 1000 UNITS Caps  Take 1,000 Units by mouth daily.         SignedOphelia Charter 12/05/2013, 11:24 AM

## 2014-04-29 ENCOUNTER — Ambulatory Visit
Admission: RE | Admit: 2014-04-29 | Discharge: 2014-04-29 | Disposition: A | Discharge status: Home / Self Care | Payer: Medicare Other | Source: Ambulatory Visit | Attending: Family Medicine | Admitting: Family Medicine

## 2014-04-29 ENCOUNTER — Other Ambulatory Visit: Payer: Self-pay | Admitting: Family Medicine

## 2014-04-29 DIAGNOSIS — M7989 Other specified soft tissue disorders: Secondary | ICD-10-CM

## 2014-05-01 ENCOUNTER — Other Ambulatory Visit: Payer: Self-pay | Admitting: Gastroenterology

## 2014-05-07 ENCOUNTER — Other Ambulatory Visit: Payer: Self-pay | Admitting: Specialist

## 2014-05-07 DIAGNOSIS — M7121 Synovial cyst of popliteal space [Baker], right knee: Secondary | ICD-10-CM

## 2014-05-08 ENCOUNTER — Other Ambulatory Visit: Payer: Self-pay | Admitting: Specialist

## 2014-05-08 ENCOUNTER — Ambulatory Visit
Admission: RE | Admit: 2014-05-08 | Discharge: 2014-05-08 | Disposition: A | Discharge status: Home / Self Care | Payer: Medicare Other | Source: Ambulatory Visit | Attending: Specialist | Admitting: Specialist

## 2014-05-08 DIAGNOSIS — M7121 Synovial cyst of popliteal space [Baker], right knee: Secondary | ICD-10-CM

## 2014-05-16 ENCOUNTER — Encounter: Payer: Self-pay | Admitting: Cardiology

## 2014-06-03 ENCOUNTER — Ambulatory Visit: Payer: Medicare Other | Admitting: Cardiology

## 2014-06-08 ENCOUNTER — Telehealth: Payer: Self-pay | Admitting: Nurse Practitioner

## 2014-06-08 NOTE — Telephone Encounter (Signed)
   Per pts wife, he was recently dx with afib by Dr. Wynonia Lawman and was placed on metoprolol 50 mg and eliquis 5 mg bid.  He took first doses of his meds this am and then ran some errands.  When he came home, he c/o tachypalpitations and fatigue and is now taking a nap.  He was not having c/p or dyspnea.  His wife called b/c she was concerned.  HR's have been in the 90's but have been irregular.  BP's have been in the 110's.  I advised that he feels poorly and needs to be seen today, he will have to come into the ED.  Provided that he feels well when he wakes up, I reassured her that he is on the appropriate medicines and based on the VS she is reporting, he is reasonably well rate controlled and hemodynamically stable.  Caller verbalized understanding and was grateful for the call back.  She will bring him into the ED if he continues to report symptoms.  Murray Hodgkins, NP 06/08/2014, 3:21 PM

## 2014-07-15 ENCOUNTER — Other Ambulatory Visit: Payer: Self-pay | Admitting: Urology

## 2014-07-16 ENCOUNTER — Encounter: Payer: Self-pay | Admitting: Cardiology

## 2014-07-16 DIAGNOSIS — C679 Malignant neoplasm of bladder, unspecified: Secondary | ICD-10-CM

## 2014-07-16 DIAGNOSIS — G4730 Sleep apnea, unspecified: Secondary | ICD-10-CM

## 2014-07-16 DIAGNOSIS — Z7901 Long term (current) use of anticoagulants: Secondary | ICD-10-CM

## 2014-07-16 DIAGNOSIS — E669 Obesity, unspecified: Secondary | ICD-10-CM

## 2014-07-16 DIAGNOSIS — I480 Paroxysmal atrial fibrillation: Secondary | ICD-10-CM

## 2014-07-16 DIAGNOSIS — I119 Hypertensive heart disease without heart failure: Secondary | ICD-10-CM

## 2014-07-16 DIAGNOSIS — C61 Malignant neoplasm of prostate: Secondary | ICD-10-CM

## 2014-07-22 ENCOUNTER — Encounter (HOSPITAL_COMMUNITY): Admission: RE | Payer: Self-pay | Source: Ambulatory Visit

## 2014-07-22 ENCOUNTER — Ambulatory Visit (HOSPITAL_COMMUNITY): Admission: RE | Admit: 2014-07-22 | Payer: Medicare Other | Source: Ambulatory Visit | Admitting: Gastroenterology

## 2014-07-22 SURGERY — COLONOSCOPY WITH PROPOFOL
Anesthesia: Monitor Anesthesia Care

## 2014-07-24 NOTE — Patient Instructions (Addendum)
Charles Wright  07/24/2014   Your procedure is scheduled on: 07-30-14  Report to [facility] Main  Entrance and follow signs to               [place] at 5:30 AM.  Call this number if you have problems the morning of surgery [phone]   Remember: ONLY 1 PERSON MAY GO WITH YOU TO SHORT STAY TO GET  READY MORNING OF [place].  Do not eat food or drink liquids :After Midnight.     Take these medicines the morning of surgery with A SIP OF WATER: Tambocor (flecainide), Lopressor (metoprolol)                              You may not have any metal on your body including hair pins and              piercings  Do not wear jewelry, , lotions, powders or perfumes, deodorant     .  Do not shave  48 hours prior to surgery.              Men may shave face and neck.   Do not bring valuables to the hospital. [place].  Contacts, dentures or bridgework may not be worn into surgery.  Leave suitcase in the car. After surgery it may be brought to your room.     Patients discharged the day of surgery will not be allowed to drive home.  Name and phone number of your driver: Wife - Everrett Lacasse - [phone]  Special Instructions:  Coughing and deep breathing exercises, leg exercises              Please read over the following fact sheets you were given: _____________________________________________________________________             [facility] - Preparing for Surgery Before surgery, you can play an important role.  Because skin is not sterile, your skin needs to be as free of germs as possible.  You can reduce the number of germs on your skin by washing with CHG (chlorahexidine gluconate) soap before surgery.  CHG is an antiseptic cleaner which kills germs and bonds with the skin to continue killing germs even after washing. Please DO NOT use if you have an allergy to CHG or antibacterial soaps.  If your skin  becomes reddened/irritated stop using the CHG and inform your nurse when you arrive at Short Stay. Do not shave (including legs and underarms) for at least 48 hours prior to the first CHG shower.  You may shave your face/neck. Please follow these instructions carefully:  1.  Shower with CHG Soap the night before surgery and the  morning of Surgery.  2.  If you choose to wash your hair, wash your hair first as usual with your  normal  shampoo.  3.  After you shampoo, rinse your hair and body thoroughly to remove the  shampoo.                           4.  Use CHG as you would any other liquid soap.  You can apply chg directly  to the skin and wash  Gently with a scrungie or clean washcloth.  5.  Apply the CHG Soap to your body ONLY FROM THE NECK DOWN.   Do not use on face/ open                           Wound or open sores. Avoid contact with eyes, ears mouth and genitals (private parts).                       Wash face,  Genitals (private parts) with your normal soap.             6.  Wash thoroughly, paying special attention to the area where your surgery  will be performed.  7.  Thoroughly rinse your body with warm water from the neck down.  8.  DO NOT shower/wash with your normal soap after using and rinsing off  the CHG Soap.                9.  Pat yourself dry with a clean towel.            10.  Wear clean pajamas.            11.  Place clean sheets on your bed the night of your first shower and do not  sleep with pets. Day of Surgery : Do not apply any lotions/deodorants the morning of surgery.  Please wear clean clothes to the hospital/surgery center.  FAILURE TO FOLLOW THESE INSTRUCTIONS MAY RESULT IN THE CANCELLATION OF YOUR SURGERY PATIENT SIGNATURE_________________________________  NURSE SIGNATURE__________________________________  ________________________________________________________________________

## 2014-07-26 ENCOUNTER — Encounter (HOSPITAL_COMMUNITY)
Admission: RE | Admit: 2014-07-26 | Discharge: 2014-07-26 | Disposition: A | Discharge status: Home / Self Care | Payer: Medicare Other | Source: Ambulatory Visit | Attending: Urology | Admitting: Urology

## 2014-07-26 ENCOUNTER — Encounter (HOSPITAL_COMMUNITY): Payer: Self-pay

## 2014-07-26 DIAGNOSIS — C679 Malignant neoplasm of bladder, unspecified: Secondary | ICD-10-CM | POA: Diagnosis not present

## 2014-07-26 DIAGNOSIS — Z01818 Encounter for other preprocedural examination: Secondary | ICD-10-CM | POA: Diagnosis not present

## 2014-07-26 LAB — BASIC METABOLIC PANEL
Anion gap: 8 (ref 5–15)
BUN: 20 mg/dL (ref 6–20)
CALCIUM: 9.3 mg/dL (ref 8.9–10.3)
CO2: 26 mmol/L (ref 22–32)
Chloride: 106 mmol/L (ref 101–111)
Creatinine, Ser: 1.14 mg/dL (ref 0.61–1.24)
GFR calc non Af Amer: >60 mL/min (ref >60)
GLUCOSE: 112 mg/dL — AB (ref 65–99)
Potassium: 4.7 mmol/L (ref 3.5–5.1)
Sodium: 140 mmol/L (ref 135–145)

## 2014-07-26 LAB — CBC
HCT: 47.3 % (ref 39.0–52.0)
HEMOGLOBIN: 15.1 g/dL (ref 13.0–17.0)
MCH: 28.9 pg (ref 26.0–34.0)
MCHC: 31.9 g/dL (ref 30.0–36.0)
MCV: 90.6 fL (ref 78.0–100.0)
Platelets: 341 10*3/uL (ref 150–400)
RBC: 5.22 MIL/uL (ref 4.22–5.81)
RDW: 13.4 % (ref 11.5–15.5)
WBC: 10.1 10*3/uL (ref 4.0–10.5)

## 2014-07-26 LAB — PSA: PSA: 2.96 ng/mL (ref 0.00–4.00)

## 2014-07-26 NOTE — Progress Notes (Signed)
07-26-14 at preop appt. Pt. Stated given RX from Dr. Junious Silk for Nitrofurantoin Mac 100 mg. To take 3 days prior to surgery.

## 2014-07-26 NOTE — Progress Notes (Addendum)
07-26-14 at preop appt. Pt. Stated has been diagnosed with sleep apnea, but does not use C-pap machine 07-26-14 at preop appt. Pt. Stated he was instructed by surgeon to stop Eliquis 2 days prior to surgery EKG - 07-16-14- in chart LOV - Dr. Wynonia Lawman (cardiologist) - 07-16-14 - in chart ECHO - 06-07-14 - in chart LOV - Dr. Melvyn Novas (pulmonary) -01-26-13 EPIC 2v CXR- 01-16-13 EPIC

## 2014-07-26 NOTE — Progress Notes (Signed)
PSA results done  07/26/2014 faxed via EPIC to Dr [name].

## 2014-07-29 NOTE — H&P (Signed)
History of Present Illness   F/u- PCP DR. Reade     1- bladder cancer - Sept 2013 HGTa right bladder, left UO, Jul 2015 CIS right bladder  Last upper tract: Jul 2015 RGP's; Mar 2015 CT Hematuria Last BCG: Feb 2016 completed maintenance  Last cystoscopy: Jan 2015 - mild erythema post and at dome Last cytology: Jan 2016 negative     2-Prostate cancer/BPH/prostate nodule -July 2013 PSA 3.06 -Sep 2013 normal DRE under anesthesia -May 2014 PSA 3.8, nodule right prostate -Jul 2014 DRE - right apical nodule (visible on U/S - hyperechoic, shadowing) TRUS postate bx (apical nodule directly samples in right apical bx) - core looked different - dark, stone-like material  42 gram prostate right apical nodule on DRE Right base atypical small acinar proliferation Right mid 3+3=6, 15%  Right apex - benign with inflammation -Mar 2015 PSA 2.86 -Jul 2015 - Exam under anesthesia - normal DRE, benign prostate -Jan 2016 PSA 2.69     3-left ureteral stricture -  -May 2010 - atypical cytology, CT scan negative for hydronephrosis but a little dilated distal ureter was noted. Cystoscopy, bilateral RGP - finding of a LEFT ureteral stricture about 2-3 cm just below the pelvic brim on the left side. There was no hydronephrosis. URS not successful. He was stented. -Apr 2010 repeat left URS - dilated, but still scope would not pass proximal to stricture. Brush biopsy of this area was negative. Larger stent was placed and removed.  -July 2010 IVP - normal and showed no hydronephrosis or abnormality of the left distal ureter.  -Jan 2011 CT scan normal, no hydronephrosis, and the hyperechoic area of the left kidney was a benign column of Bertin. Cytology was negative July 2010.  -Sept 2013 left distal ureteral stricture seen on URS, but no mass (URS done at time of bladder Ca removal).  -August 08, 2013 patient had left flank pain nausea and vomiting. White count 11.4, BUN 18, creatinine 1.1, UA - 40 red blood  cells, 58 white blood cells, no bacteria. Afebrile. Treated with Cipro. CT scan showed stranding around the left peripelvic fat and left ureter with mild ureteral dilation. There were no stones.    May 2016  Interval Hx   The patient returns and continued management of bladder cancer and prostate cancer. Also BPH. He's been well without gross hematuria or flank pain but he has had some mild dysuria since his last BCG. His UA today shows too numerous to count red cells and rare bacteria.      Past Medical History Problems  1. History of Abdominal pain, LLQ (left lower quadrant) (R10.32) 2. History of kidney stones (Z87.442) 3. History of Microscopic hematuria (R31.2) 4. History of Nephrolithiasis Of The Left Kidney 5. History of Ureteral stricture (N13.5)  Surgical History Problems  1. History of Back Surgery 2. History of Biopsy Of The Prostate Needle 3. History of Cystoscopy With Biopsy 4. History of Cystoscopy With Fulguration Minor Lesion (Under 57mm) 5. History of Cystoscopy With Fulguration Minor Lesion (Under 45mm) 6. History of Cystoscopy With Fulguration Small Lesion (5-28mm) 7. History of Cystoscopy With Insertion Of Ureteral Stent Left 8. History of Cystoscopy With Insertion Of Ureteral Stent Left 9. History of Cystoscopy With Insertion Of Ureteral Stent Left 10. History of Cystoscopy With Ureteral Cath And Brush Biopsy Left 11. History of Cystoscopy With Ureteroscopy For Biopsy Left 12. History of Cystoscopy With Ureteroscopy Left 13. History of Cystourethroscopy With Treatment Of Ureteral Stricture 14. History of Leg  Repair 15. History of Oral Surgery  Current Meds 1. BCG Instillation; BCG half dose today; To Be Done: [id number]; Status: HOLD FOR -  Administration Ordered 2. BCG Instillation; BCG today full dose; To Be Done: [id number]; Status: HOLD FOR -  Administration Ordered 3. Diovan TABS;  Therapy: (Recorded:27May2016) to Recorded 4. Eliquis 5 MG Oral  Tablet;  Therapy: (Recorded:27May2016) to Recorded 5. EpiPen 0.3 MG/0.3ML DEVI;  Therapy: [id number] to Recorded 6. Glucosamine CAPS;  Therapy: (DJTTSVXB:93JQZ0092) to Recorded 7. Ibuprofen 800 MG Oral Tablet;  Therapy: 27Apr2012 to Recorded 8. Metoprolol Tartrate TABS;  Therapy: (Recorded:27May2016) to Recorded 9. Multi-Vitamin/Iron TABS;  Therapy: (ZRAQTMAU:63FHL4562) to Recorded 10. Naproxen TABS;   Therapy: (Recorded:05Oct2015) to Recorded 11. Tamsulosin HCl - 0.4 MG Oral Capsule; TAKE 1 CAPSULE Daily;   Therapy: 56LSL3734 to (Evaluate:10Mar2017)  Requested for: 28JGO1157; Last   Rx:15Mar2016 Ordered 12. Vitamin D TABS;   Therapy: (Recorded:21Jul2014) to Recorded  Allergies Medication  1. OxyCODONE HCl TABS  Family History Problems  1. Family history of Death In The Family Father   Deceased at age 32 2. Family history of Death In The Family Mother   Deceased at age 69 3. No pertinent family history : Sibling  Social History Problems  1. Alcohol Use   2 daily 2. Caffeine Use   2 daily 3. Marital History - Currently Married 4. 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 Denied: History of Tobacco Use  Vitals Vital Signs [Data Includes: Last 1 Day]  Recorded: 26OMB5597 03:21PM  Blood Pressure: 109 / 69 Temperature: 97.6 F Heart Rate: 78  Physical Exam Constitutional: Well nourished and well developed . No acute distress.  Pulmonary: No respiratory distress and normal respiratory rhythm and effort.  Cardiovascular: Heart rate and rhythm are normal . No peripheral edema.  Neuro/Psych:. Mood and affect are appropriate.    Results/Data Urine [Data Includes: Last 1 Day]   [id number]  COLOR YELLOW   APPEARANCE CLEAR   SPECIFIC GRAVITY 1.025   pH 6.0   GLUCOSE NEG mg/dL  BILIRUBIN NEG   KETONE NEG mg/dL  BLOOD LARGE   PROTEIN NEG mg/dL  UROBILINOGEN 0.2 mg/dL  NITRITE NEG   LEUKOCYTE ESTERASE NEG   SQUAMOUS EPITHELIAL/HPF RARE   WBC 3-6  WBC/hpf  RBC TNTC RBC/hpf  BACTERIA RARE   CRYSTALS NONE SEEN   CASTS NONE SEEN    Procedure  Procedure: Cystoscopy   Indication: History of Urothelial Carcinoma.  Informed Consent: Risks, benefits, and potential adverse events were discussed and informed consent was obtained from the patient.  Prep: The patient was prepped with betadine.  Antibiotic prophylaxis: Ciprofloxacin.  Procedure Note:  Urethral meatus:. No abnormalities.  Anterior urethra: No abnormalities.  Prostatic urethra: No abnormalities.  Bladder: Visulization was clear. The ureteral orifices were in the normal anatomic position bilaterally and had clear efflux of urine. A systematic survey of the bladder demonstrated no bladder tumors or stones. The mucosa was smooth without abnormalities. Examination of the bladder demonstrated erythematous mucosa located on the posterior aspect, near the dome of the bladder . The patient tolerated the procedure well.  Complications: None.    Assessment Assessed  1. Benign prostatic hyperplasia with urinary obstruction (N40.1,N13.8) 2. Malignant neoplasm of overlapping sites of bladder (C67.8)  Plan Benign prostatic hyperplasia with urinary obstruction  1. URINE CULTURE; Status:Hold For - Specimen/Data Collection,Appointment; Requested  for:27May2016;  Health Maintenance  2. UA With REFLEX; [Do Not Release]; Status:Complete;   Done: 46OEH2122 03:12PM Malignant neoplasm of overlapping  sites of bladder  3. Follow-up Month x 3 Office  Follow-up  Status: Hold For - Appointment,Date of Service   Requested for: 27May2016 4. Cysto; Status:Complete;   Done: 31DVV6160  Discussion/Summary      Prostate cancer - plan exam under anesthesia. Wisl send PSA preoperatively.   Bladder cancer - no obvious recurrence in bladder but the erythema in the posterior in the dome persists. Given irritative voiding symptoms and microscopic hematuria this could all be related inflammation and BCG  response. I want to be certain of that and we discussed the nature risk and benefits of cystoscopy, bladder biopsy, fulguration, bilateral retrograde pyelograms and exam under anesthesia. He elects to proceed.     Signatures Electronically signed by : [name], M.D.; Jul 05 2014  3:55PM EST  Addendum urine culture grew 4000 and significant growth.  I did send an antibiotic to start.  PSA was 2.96.

## 2014-07-30 ENCOUNTER — Ambulatory Visit (HOSPITAL_COMMUNITY): Payer: Medicare Other | Admitting: Anesthesiology

## 2014-07-30 ENCOUNTER — Encounter (HOSPITAL_COMMUNITY)
Admission: RE | Disposition: A | Discharge status: Home / Self Care | Payer: Self-pay | Source: Ambulatory Visit | Attending: Urology

## 2014-07-30 ENCOUNTER — Ambulatory Visit (HOSPITAL_COMMUNITY)
Admission: RE | Admit: 2014-07-30 | Discharge: 2014-07-30 | Disposition: A | Discharge status: Home / Self Care | Payer: Medicare Other | Source: Ambulatory Visit | Attending: Urology | Admitting: Urology

## 2014-07-30 ENCOUNTER — Encounter (HOSPITAL_COMMUNITY): Payer: Self-pay | Admitting: *Deleted

## 2014-07-30 DIAGNOSIS — C679 Malignant neoplasm of bladder, unspecified: Secondary | ICD-10-CM | POA: Insufficient documentation

## 2014-07-30 DIAGNOSIS — Z87442 Personal history of urinary calculi: Secondary | ICD-10-CM | POA: Diagnosis not present

## 2014-07-30 DIAGNOSIS — Z888 Allergy status to other drugs, medicaments and biological substances status: Secondary | ICD-10-CM | POA: Insufficient documentation

## 2014-07-30 DIAGNOSIS — N135 Crossing vessel and stricture of ureter without hydronephrosis: Secondary | ICD-10-CM | POA: Diagnosis not present

## 2014-07-30 DIAGNOSIS — K219 Gastro-esophageal reflux disease without esophagitis: Secondary | ICD-10-CM | POA: Insufficient documentation

## 2014-07-30 DIAGNOSIS — Z885 Allergy status to narcotic agent status: Secondary | ICD-10-CM | POA: Diagnosis not present

## 2014-07-30 DIAGNOSIS — I10 Essential (primary) hypertension: Secondary | ICD-10-CM | POA: Insufficient documentation

## 2014-07-30 DIAGNOSIS — E669 Obesity, unspecified: Secondary | ICD-10-CM | POA: Insufficient documentation

## 2014-07-30 DIAGNOSIS — Z91030 Bee allergy status: Secondary | ICD-10-CM | POA: Insufficient documentation

## 2014-07-30 DIAGNOSIS — Z8546 Personal history of malignant neoplasm of prostate: Secondary | ICD-10-CM | POA: Diagnosis not present

## 2014-07-30 DIAGNOSIS — I4891 Unspecified atrial fibrillation: Secondary | ICD-10-CM | POA: Diagnosis not present

## 2014-07-30 DIAGNOSIS — C674 Malignant neoplasm of posterior wall of bladder: Secondary | ICD-10-CM

## 2014-07-30 DIAGNOSIS — Z6833 Body mass index (BMI) 33.0-33.9, adult: Secondary | ICD-10-CM | POA: Diagnosis not present

## 2014-07-30 DIAGNOSIS — N3020 Other chronic cystitis without hematuria: Secondary | ICD-10-CM | POA: Insufficient documentation

## 2014-07-30 DIAGNOSIS — G4730 Sleep apnea, unspecified: Secondary | ICD-10-CM | POA: Diagnosis not present

## 2014-07-30 HISTORY — PX: CYSTOSCOPY WITH RETROGRADE PYELOGRAM, URETEROSCOPY AND STENT PLACEMENT: SHX5789

## 2014-07-30 SURGERY — CYSTOURETEROSCOPY, WITH RETROGRADE PYELOGRAM AND STENT INSERTION
Anesthesia: General | Laterality: Bilateral

## 2014-07-30 MED ORDER — LIDOCAINE HCL 2 % EX GEL
CUTANEOUS | Status: AC
  Filled 2014-07-30: qty 10

## 2014-07-30 MED ORDER — EPHEDRINE SULFATE 50 MG/ML IJ SOLN
INTRAMUSCULAR | Status: AC
  Filled 2014-07-30: qty 1

## 2014-07-30 MED ORDER — STERILE WATER FOR IRRIGATION IR SOLN
Status: DC | PRN
  Administered 2014-07-30: 3000 mL

## 2014-07-30 MED ORDER — GLYCOPYRROLATE 0.2 MG/ML IJ SOLN
INTRAMUSCULAR | Status: DC | PRN
  Administered 2014-07-30: 0.2 mg via INTRAVENOUS

## 2014-07-30 MED ORDER — ACETAMINOPHEN 10 MG/ML IV SOLN
INTRAVENOUS | Status: DC | PRN
Start: 2014-07-30 — End: 2014-07-30
  Administered 2014-07-30: 1000 mg via INTRAVENOUS

## 2014-07-30 MED ORDER — FENTANYL CITRATE (PF) 100 MCG/2ML IJ SOLN
INTRAMUSCULAR | Status: AC
  Filled 2014-07-30: qty 2

## 2014-07-30 MED ORDER — DEXAMETHASONE SODIUM PHOSPHATE 10 MG/ML IJ SOLN
INTRAMUSCULAR | Status: DC | PRN
  Administered 2014-07-30: 10 mg via INTRAVENOUS

## 2014-07-30 MED ORDER — ONDANSETRON HCL 4 MG/2ML IJ SOLN: 4.0000 mg | Freq: Once | INTRAMUSCULAR | Status: DC | PRN

## 2014-07-30 MED ORDER — ONDANSETRON HCL 4 MG/2ML IJ SOLN
INTRAMUSCULAR | Status: DC | PRN
  Administered 2014-07-30: 4 mg via INTRAVENOUS

## 2014-07-30 MED ORDER — FENTANYL CITRATE (PF) 100 MCG/2ML IJ SOLN
INTRAMUSCULAR | Status: DC | PRN
  Administered 2014-07-30 (×8): 25 ug via INTRAVENOUS

## 2014-07-30 MED ORDER — ACETAMINOPHEN 10 MG/ML IV SOLN
INTRAVENOUS | Status: AC
  Filled 2014-07-30: qty 100

## 2014-07-30 MED ORDER — CEFAZOLIN SODIUM-DEXTROSE 2-3 GM-% IV SOLR
INTRAVENOUS | Status: AC
  Filled 2014-07-30: qty 50

## 2014-07-30 MED ORDER — ONDANSETRON HCL 4 MG/2ML IJ SOLN
INTRAMUSCULAR | Status: AC
  Filled 2014-07-30: qty 2

## 2014-07-30 MED ORDER — LIDOCAINE HCL (CARDIAC) 20 MG/ML IV SOLN
INTRAVENOUS | Status: DC | PRN
  Administered 2014-07-30: 80 mg via INTRAVENOUS

## 2014-07-30 MED ORDER — IOHEXOL 350 MG/ML SOLN
INTRAVENOUS | Status: DC | PRN
  Administered 2014-07-30: 17.5 mL

## 2014-07-30 MED ORDER — PROPOFOL 10 MG/ML IV BOLUS
INTRAVENOUS | Status: AC
  Filled 2014-07-30: qty 20

## 2014-07-30 MED ORDER — PHENYLEPHRINE HCL 10 MG/ML IJ SOLN
INTRAMUSCULAR | Status: DC | PRN
  Administered 2014-07-30: 40 ug via INTRAVENOUS

## 2014-07-30 MED ORDER — LIDOCAINE HCL (CARDIAC) 20 MG/ML IV SOLN
INTRAVENOUS | Status: AC
  Filled 2014-07-30: qty 5

## 2014-07-30 MED ORDER — GLYCOPYRROLATE 0.2 MG/ML IJ SOLN
INTRAMUSCULAR | Status: AC
  Filled 2014-07-30: qty 1

## 2014-07-30 MED ORDER — FENTANYL CITRATE (PF) 100 MCG/2ML IJ SOLN: 25.0000 ug | INTRAMUSCULAR | Status: DC | PRN

## 2014-07-30 MED ORDER — CEFAZOLIN SODIUM-DEXTROSE 2-3 GM-% IV SOLR
2.0000 g | INTRAVENOUS | Status: AC
  Administered 2014-07-30: 2 g via INTRAVENOUS

## 2014-07-30 MED ORDER — URELLE 81 MG PO TABS: 1.0000 | ORAL_TABLET | Freq: Four times a day (QID) | ORAL | Status: DC | PRN

## 2014-07-30 MED ORDER — PROPOFOL 10 MG/ML IV BOLUS
INTRAVENOUS | Status: DC | PRN
  Administered 2014-07-30: 200 mg via INTRAVENOUS

## 2014-07-30 MED ORDER — SODIUM CHLORIDE 0.9 % IR SOLN
Status: DC | PRN
  Administered 2014-07-30: 1000 mL

## 2014-07-30 MED ORDER — DEXAMETHASONE SODIUM PHOSPHATE 10 MG/ML IJ SOLN
INTRAMUSCULAR | Status: AC
  Filled 2014-07-30: qty 1

## 2014-07-30 MED ORDER — EPHEDRINE SULFATE 50 MG/ML IJ SOLN
INTRAMUSCULAR | Status: DC | PRN
  Administered 2014-07-30 (×4): 10 mg via INTRAVENOUS

## 2014-07-30 MED ORDER — APIXABAN 5 MG PO TABS: 5.0000 mg | ORAL_TABLET | Freq: Two times a day (BID) | ORAL | Status: DC

## 2014-07-30 MED ORDER — LACTATED RINGERS IV SOLN
INTRAVENOUS | Status: DC | PRN
  Administered 2014-07-30 (×2): via INTRAVENOUS

## 2014-07-30 MED ORDER — URELLE 81 MG PO TABS
1.0000 | ORAL_TABLET | Freq: Four times a day (QID) | ORAL | Status: DC | PRN
  Filled 2014-07-30: qty 1

## 2014-07-30 SURGICAL SUPPLY — 28 items
BAG URO CATCHER STRL LF (DRAPE) ×3 IMPLANT
BASKET LASER NITINOL 1.9FR (BASKET) IMPLANT
BASKET STNLS GEMINI 4WIRE 3FR (BASKET) IMPLANT
BASKET ZERO TIP NITINOL 2.4FR (BASKET) IMPLANT
BRUSH URET BIOPSY 3F (UROLOGICAL SUPPLIES) IMPLANT
BSKT STON RTRVL ZERO TP 2.4FR (BASKET)
CATH INTERMIT  6FR 70CM (CATHETERS) IMPLANT
CATH URET 5FR 28IN CONE TIP (BALLOONS) ×2
CATH URET 5FR 70CM CONE TIP (BALLOONS) ×1 IMPLANT
CLOTH BEACON ORANGE TIMEOUT ST (SAFETY) ×3 IMPLANT
FIBER LASER FLEXIVA 1000 (UROLOGICAL SUPPLIES) IMPLANT
FIBER LASER FLEXIVA 200 (UROLOGICAL SUPPLIES) IMPLANT
FIBER LASER FLEXIVA 365 (UROLOGICAL SUPPLIES) IMPLANT
FIBER LASER FLEXIVA 550 (UROLOGICAL SUPPLIES) IMPLANT
FIBER LASER TRAC TIP (UROLOGICAL SUPPLIES) IMPLANT
GLOVE BIOGEL M STRL SZ7.5 (GLOVE) ×3 IMPLANT
GOWN STRL REUS W/TWL XL LVL3 (GOWN DISPOSABLE) ×3 IMPLANT
GUIDEWIRE ANG ZIPWIRE 038X150 (WIRE) IMPLANT
GUIDEWIRE STR DUAL SENSOR (WIRE) IMPLANT
IV NS IRRIG 3000ML ARTHROMATIC (IV SOLUTION) ×6 IMPLANT
KIT BALLIN UROMAX 15FX10 (LABEL) IMPLANT
KIT BALLN UROMAX 15FX4 (MISCELLANEOUS) IMPLANT
KIT BALLN UROMAX 26 75X4 (MISCELLANEOUS)
PACK CYSTO (CUSTOM PROCEDURE TRAY) ×6 IMPLANT
SET HIGH PRES BAL DIL (LABEL)
SHEATH ACCESS URETERAL 24CM (SHEATH) IMPLANT
SHEATH ACCESS URETERAL 54CM (SHEATH) IMPLANT
SYRINGE IRR TOOMEY STRL 70CC (SYRINGE) IMPLANT

## 2014-07-30 NOTE — Transfer of Care (Signed)
Immediate Anesthesia Transfer of Care Note  Patient: Charles Wright  Procedure(s) Performed: Procedure(s) (LRB): CYSTOSCOPY WITH BLADDER BX FULERGATION AND BILATERAL RETROGRADE PYELOGRAM, (Bilateral)  Patient Location: PACU  Anesthesia Type: General  Level of Consciousness: awake, sedated, patient cooperative and responds to stimulation  Airway & Oxygen Therapy: Patient Spontanous Breathing and Patient connected to face mask oxygen  Post-op Assessment: Report given to PACU RN, Post -op Vital signs reviewed and stable and Patient moving all extremities  Post vital signs: Reviewed and stable  Complications: No apparent anesthesia complications

## 2014-07-30 NOTE — Interval H&P Note (Signed)
History and Physical Interval Note:  07/30/2014 7:16 AM  Charles Wright  has presented today for surgery, with the diagnosis of BLADDER CANCER   The various methods of treatment have been discussed with the patient and family. After consideration of risks, benefits and other options for treatment, the patient has consented to  Procedure(s): Leando, (Bilateral) as a surgical intervention .  The patient's history has been reviewed, patient examined, no change in status, stable for surgery.  I have reviewed the patient's chart and labs.  Questions were answered to the patient's satisfaction. Pt with frequency and dysuria since last BCG. No fever. Stable. Urine Cx with mixed growth. Abx started but no change. May be BCG effect.     [name]

## 2014-07-30 NOTE — Anesthesia Preprocedure Evaluation (Signed)
Anesthesia Evaluation  Patient identified by MRN, date of birth, ID band Patient awake    Reviewed: Allergy & Precautions, NPO status , Patient's Chart, lab work & pertinent test results  History of Anesthesia Complications Negative for: history of anesthetic complications  Airway Mallampati: II  TM Distance: >3 FB Neck ROM: Full    Dental no notable dental hx. (+) Dental Advisory Given   Pulmonary sleep apnea ,  breath sounds clear to auscultation  Pulmonary exam normal       Cardiovascular hypertension, Pt. on medications and Pt. on home beta blockers Normal cardiovascular exam+ dysrhythmias Atrial Fibrillation Rhythm:Regular Rate:Normal     Neuro/Psych negative neurological ROS  negative psych ROS   GI/Hepatic Neg liver ROS, GERD-  Medicated and Controlled,  Endo/Other  obesity  Renal/GU   negative genitourinary   Musculoskeletal negative musculoskeletal ROS (+)   Abdominal   Peds negative pediatric ROS (+)  Hematology negative hematology ROS (+)   Anesthesia Other Findings   Reproductive/Obstetrics negative OB ROS                             Anesthesia Physical Anesthesia Plan  ASA: III  Anesthesia Plan: General   Post-op Pain Management:    Induction: Intravenous  Airway Management Planned: LMA  Additional Equipment:   Intra-op Plan:   Post-operative Plan: Extubation in OR  Informed Consent: I have reviewed the patients History and Physical, chart, labs and discussed the procedure including the risks, benefits and alternatives for the proposed anesthesia with the patient or authorized representative who has indicated his/her understanding and acceptance.   Dental advisory given  Plan Discussed with: CRNA  Anesthesia Plan Comments:         Anesthesia Quick Evaluation

## 2014-07-30 NOTE — Anesthesia Procedure Notes (Signed)
Procedure Name: LMA Insertion Date/Time: 07/30/2014 7:31 AM Performed by: Justice Rocher Pre-anesthesia Checklist: Patient identified, Emergency Drugs available, Suction available and Patient being monitored Patient Re-evaluated:Patient Re-evaluated prior to inductionOxygen Delivery Method: Circle System Utilized Preoxygenation: Pre-oxygenation with 100% oxygen Intubation Type: IV induction Ventilation: Mask ventilation without difficulty LMA: LMA inserted LMA Size: 5.0 Number of attempts: 1 Airway Equipment and Method: Bite block Placement Confirmation: positive ETCO2 Tube secured with: Tape Dental Injury: Teeth and Oropharynx as per pre-operative assessment

## 2014-07-30 NOTE — Anesthesia Postprocedure Evaluation (Signed)
  Anesthesia Post-op Note  Patient: Charles Wright  Procedure(s) Performed: Procedure(s) (LRB): CYSTOSCOPY WITH BLADDER BX FULERGATION AND BILATERAL RETROGRADE PYELOGRAM, (Bilateral)  Patient Location: PACU  Anesthesia Type: General  Level of Consciousness: awake and alert   Airway and Oxygen Therapy: Patient Spontanous Breathing  Post-op Pain: mild  Post-op Assessment: Post-op Vital signs reviewed, Patient's Cardiovascular Status Stable, Respiratory Function Stable, Patent Airway and No signs of Nausea or vomiting  Last Vitals:  Filed Vitals:   07/30/14 1057  BP: 110/63  Pulse: 50  Temp:   Resp: 16    Post-op Vital Signs: stable   Complications: No apparent anesthesia complications

## 2014-07-30 NOTE — Op Note (Signed)
Preoperative diagnosis: Bladder cancer, prostate cancer, left ureteral stricture  Postoperative diagnosis: Same  Procedure: Exam under anesthesia, cystoscopy, bladder biopsy and fulguration, Bilateral retrograde pyelogram  Surgeon: Junious Silk  Anesthesia: Gen.  Indication for procedure: Patient's a 79 year old male with a history of high-grade TA bladder cancer and CIS. He had some posterior and dome areas severe edema and he was brought for bladder biopsy. He also has a history of low-grade prostate cancer under surveillance and the left ureteral stricture. Plan also for exam under anesthesia and retrograde pyelograms.  Findings: On exam under anesthesia the penis was circumcised and without mass or lesion. Testicles were descended bilaterally and palpably normal. On digital rectal exam the prostate was small and benign. There were no hard areas or nodules. All landmarks preserved. Capsule smooth.  On cystoscopy the urethra was normal, prostatic urethra unremarkable, trigone and ureteral orifices in their normal orthotopic position. Left orifice was slightly lateral from prior resection but smooth. There was bilateral clear efflux. Posteriorly on the right adjacent to the prior scar/resection there was some erythema. This was biopsied. There was some erythema at the right dome and more of the central left. These were biopsied.  Left retrograde pyelogram-this outlined a single ureter single collecting system unit without filling defect or dilation. The known narrowing in the distal ureter was stable with minimal proximal dilation.   Right retrograde pyelogram-this outlined a single ureter single collecting system unit without filling defect stricture dilation.  Both sides drained briskly.  Description of procedure: After consent was obtained patient was brought to the operating room. After adequate anesthesia he is placed in lithotomy position prepped and draped in the usual sterile fashion. An  exam under anesthesia was performed. Cystoscope was passed per    urethra. the bladder was examined with a 30 and 70 lens. The bladder was triple rinsed. The flexible biopsy forceps were used to biopsy the right posterior right dome and left stone and the sites were fulgurated lightly. Hemostasis was excellent. The bladder was triple rinsed. The left ureter was then cannulated with a 6 [place] open-ended catheter and retrograde injection of contrast was performed. The right ureteral orifice was smaller and more delicate and it was cannulated with the tip of a cone-tipped catheter and retrograde injection of contrast performed.   Biopsy sites were normal and showed good hemostasis. The bladder was drained and the scope removed. Lidocaine jelly placed per urethra. The patient was awakened taken to recovery room in stable condition.   Complications: None   Blood loss: Minimal  Drains: None   Specimens to pathology:  Bladder biopsies- #1 right posterior  #2 right dome  #3 left dome   Disposition: Patient stable to PACU.

## 2014-07-30 NOTE — Progress Notes (Signed)
O.K. For patient to go to Short Stay- per Dr. Marcell Barlow

## 2014-07-31 ENCOUNTER — Encounter (HOSPITAL_COMMUNITY): Payer: Self-pay | Admitting: Urology

## 2014-08-13 ENCOUNTER — Encounter: Payer: Self-pay | Admitting: Cardiology

## 2014-08-13 DIAGNOSIS — C679 Malignant neoplasm of bladder, unspecified: Secondary | ICD-10-CM | POA: Insufficient documentation

## 2014-08-13 DIAGNOSIS — E669 Obesity, unspecified: Secondary | ICD-10-CM | POA: Insufficient documentation

## 2014-08-13 DIAGNOSIS — Z7901 Long term (current) use of anticoagulants: Secondary | ICD-10-CM | POA: Insufficient documentation

## 2014-08-13 DIAGNOSIS — I119 Hypertensive heart disease without heart failure: Secondary | ICD-10-CM | POA: Insufficient documentation

## 2014-08-13 DIAGNOSIS — G4730 Sleep apnea, unspecified: Secondary | ICD-10-CM | POA: Insufficient documentation

## 2014-08-13 DIAGNOSIS — C61 Malignant neoplasm of prostate: Secondary | ICD-10-CM | POA: Insufficient documentation

## 2014-08-13 DIAGNOSIS — I480 Paroxysmal atrial fibrillation: Secondary | ICD-10-CM | POA: Insufficient documentation

## 2014-08-13 NOTE — Progress Notes (Signed)
Patient ID: Charles Wright, male   DOB: 13-Jun-1935, 79 y.o.   MRN: [id number]  Doyl, Bitting    Date of visit:  05/16/2014 DOB:  Sep 16, 1935    Age:  79 yrs. Medical record number:  [id number]     Account number:  [id number] Primary Care Provider: Maury Dus ____________________________ CURRENT DIAGNOSES  1. Dizziness and giddiness  2. Abnormal electrocardiogram [ECG]  3. Sleep apnea  4. Essential hypertension  5. Obesity ____________________________ ALLERGIES  Lisinopril, Intolerance-cough  Losartan, Intolerance-cough  Oxycodone, Mood swings ____________________________ MEDICATIONS  1. multivitamin tablet, 1 p.o. daily  2. Vitamin D3 1,000 unit tablet, 1 p.o. daily  3. Glucosamine 1500 Complex 500 mg-400 mg capsule, 1 p.o. daily  4. ibuprofen 200 mg tablet, PRN  5. EpiPen Jr 2-Pak 0.15 mg/0.3 mL injection,auto-injector, PRN  6. tamsulosin 0.4 mg capsule, 1 p.o. daily  7. valsartan 160 mg tablet, 1 p.o. daily ____________________________ CHIEF COMPLAINTS  Light headed ____________________________ HISTORY OF PRESENT ILLNESS Patient seen for evaluation of abnormal EKG and possible atrial fibrillation. The patient has previously been in good health except for history of some hypertension and is still active working. He tries to get some exercise and denies angina and has no PND orthopnea or significant claudication. He recently developed low back pain and has found it difficult to exercise and had back surgery about 6 months ago. He recently developed pain involving his right leg and there is a question about whether he had DVT and when he went in for the evaluation he was found to have a slow pulse rate and had an EKG showing possible atrial fibrillation and I was asked to evaluate him. He is unaware of any palpitations. He does note an abnormal EKG rhythm strip going back several years. He eventually was found to have a Baker's cyst involving his right leg and has been out trying to get some  exercise and currently is feeling better about his overall condition. He is still somewhat physically deconditioned and also is overweight. He also notes that he will develop occasional dizziness when he initially stands up or if he bends over and then stands up. He has not had any significant syncope. He does have a history of kidney stones and is also being treated for both bladder cancer as well as prostate cancer. He has a prior history of sleep apnea but is unable to tolerate CPAP and wears a mouthpiece for it. ____________________________ PAST HISTORY  Past Medical Illnesses:  kidney stones, prostate cancer, sleep apnea, bladder cancer, gilberts syndrome, hypertension, obesity, BPH;  Cardiovascular Illnesses:  no previous history of cardiac disease.;  Surgical Procedures:  hernia repair, laminectomy lumbar, fx l fibula, bladder surg, vasectomy;  NYHA Classification:  I;  Canadian Angina Classification:  Class 0: Asymptomatic;  Cardiology Procedures-Invasive:  no history of prior cardiac procedures;  Cardiology Procedures-Noninvasive:  treadmill;  LVEF not documented,   ____________________________ CARDIO-PULMONARY TEST DATES EKG Date:  05/16/2014;   ____________________________ FAMILY HISTORY Father -- Father dead, Heart Attack Mother -- Mother dead, Leukemia Sister -- Sister alive and well ____________________________ SOCIAL HISTORY Alcohol Use:  wine 1 per day;  Smoking:  never smoked;  Lifestyle:  divorced, remarried and 2 daughters;  Exercise:  1-2 times q week;  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 Respiratory: denies dyspnea, cough, wheezing or hemoptysis. Cardiovascular:  please review HPI Abdominal: denies  dyspepsia, GI bleeding, constipation, or diarrhea Genitourinary-Male: nocturia  Musculoskeletal:  lumbar disc disease, Baker's cyst Neurological:  denies headaches, stroke, or TIA Psychiatric:  denies depression or anxiety Hematological/Immunologic:  denies any food allergies, bleeding disorders. ____________________________ PHYSICAL EXAMINATION VITAL SIGNS  Blood Pressure:  110/60 Sitting, Right arm, regular cuff  , 104/56 Standing, Right arm and regular cuff   Pulse:  72/min. Weight:  216.00 lbs. Height:  68"BMI: 33  Constitutional:  pleasant white male in no acute distress, mildly obese Skin:  warm and dry to touch, no apparent skin lesions, or masses noted. Head:  normocephalic, normal hair pattern, no masses or tenderness Eyes:  EOMS Intact, PERRLA, C and S clear, Funduscopic exam not done. ENT:  ears, nose and throat reveal no gross abnormalities.  Dentition good. Neck:  supple, without massess. No JVD, thyromegaly or carotid bruits. Carotid upstroke normal. Chest:  normal symmetry, clear to auscultation. Cardiac:  regular rhythm, normal S1 and S2, No S3 or S4, no murmurs, gallops or rubs detected. Abdomen:  abdomen soft,non-tender, no masses, no hepatospenomegaly, or aneurysm noted Peripheral Pulses:  the femoral,dorsalis pedis, and posterior tibial pulses are full and equal bilaterally with no bruits auscultated. Extremities & Back:  mild bilateral venous insufficiency changes present Neurological:  no gross motor or sensory deficits noted, affect appropriate, oriented x3. ____________________________ IMPRESSIONS/PLAN  1. Abnormal EKG with PACs 2. Sleep apnea 3. Hypertension 4. Hyperlipidemia  Recommendations:  Extensive history reviewed. He is having some vague dizziness and his EKG today shows sinus rhythm with PACs and occasional sinus tachycardia. I have recommended that he wear a cardiac event monitor to characterize the palpitations. He also needs an echocardiogram to  assess for structural heart disease. His estimated 10 your cardiovascular risk is 26.9% and he would benefit from statin treatment. He was somewhat resistant to this idea today and I told him we can discuss this later. Lab work requested. Followup after he has had a chance to wear the event monitor. ____________________________ TODAYS ORDERS  1. 2D, color flow, doppler: At Patient Convenience  2. [place]: Today  3. Return Visit: 1 month  4. Obtain Lab/Xray from PMD: 0  5. 12 Lead EKG: Today                       ____________________________ Cardiology Physician:  Kerry Hough MD [facility]

## 2014-08-13 NOTE — Progress Notes (Signed)
Patient ID: Charles Wright, male   DOB: Dec 07, 1935, 79 y.o.   MRN: [id number]  Arthuro, Canelo    Date of visit:  07/16/2014 DOB:  21-Jan-1936    Age:  79 yrs. Medical record number:  [id number]     Account number:  [id number] Primary Care Provider: Maury Dus ____________________________ CURRENT DIAGNOSES  1. Paroxysmal atrial fibrillation  2. Long term (current) use of anticoagulants  3. Sleep apnea  4. Essential hypertension  5. Obesity ____________________________ ALLERGIES  Lisinopril, Intolerance-cough  Losartan, Intolerance-cough  Oxycodone, Mood swings ____________________________ MEDICATIONS  1. multivitamin tablet, 1 p.o. daily  2. Vitamin D3 1,000 unit tablet, 1 p.o. daily  3. Glucosamine 1500 Complex 500 mg-400 mg capsule, 1 p.o. daily  4. ibuprofen 200 mg tablet, PRN  5. EpiPen Jr 2-Pak 0.15 mg/0.3 mL injection,auto-injector, PRN  6. tamsulosin 0.4 mg capsule, 1 p.o. daily  7. valsartan 160 mg tablet, 1 p.o. daily  8. Eliquis 5 mg tablet, BID  9. metoprolol tartrate 50 mg tablet, BID  10. colchicine 0.6 mg tablet, 1 p.o. daily  11. flecainide 50 mg tablet, BID ____________________________ HISTORY OF PRESENT ILLNESS Patient returns for cardiac followup. He has worn a cardiac event monitor that as shown frequent episodes of paroxysmal atrial fibrillation on many days. He had atrial fibrillation 16/30 days he wore the monitor. He also had frequent PACs noted. He does complain of some fatigue. He has only been taking Eliquus once a day and was placed on beta blockers but he has only been taking those once a day and continued to have atrial fibrillation despite taking beta blockers. His wife return today for discussion. He denies angina and has no PND or orthopnea. He does have a history of sleep apnea evaluated many years ago but states that he was unable to wear his CPAP device because of difficulty finding a mask that would fit. Drinks one glass of alcohol per  day. ____________________________ PAST HISTORY  Past Medical Illnesses:  kidney stones, prostate cancer, sleep apnea, bladder cancer, gilberts syndrome, hypertension, obesity, BPH;  Cardiovascular Illnesses:  atrial flutter-paroxysmal;  Surgical Procedures:  hernia repair, laminectomy lumbar, fx l fibula, bladder surg, vasectomy;  NYHA Classification:  I;  Canadian Angina Classification:  Class 0: Asymptomatic;  Cardiology Procedures-Invasive:  no history of prior cardiac procedures;  Cardiology Procedures-Noninvasive:  treadmill, event monitor April 2016, echocardiogram April 2016;  LVEF of 55% documented via echocardiogram on 06/07/2014,   CHA2DS2-VASC Score:  3 ____________________________ CARDIO-PULMONARY TEST DATES EKG Date:  07/16/2014;  Holter/Event Monitor Date: 05/17/2014;  Echocardiography Date: 06/07/2014;   ____________________________ FAMILY HISTORY Father -- Father dead, Heart Attack Mother -- Mother dead, Leukemia Sister -- Sister alive and well ____________________________ REVIEW OF SYSTEMS General:  obesity  Integumentary:basal cell Carcinoma Eyes: wears eye glasses/contact lenses Respiratory: denies dyspnea, cough, wheezing or hemoptysis. Cardiovascular:  please review HPI Abdominal: denies dyspepsia, GI bleeding, constipation, or diarrhea Genitourinary-Male: nocturia  Musculoskeletal:  lumbar disc disease, Baker's cyst Neurological:  denies headaches, stroke, or TIA  ____________________________ PHYSICAL EXAMINATION VITAL SIGNS  Blood Pressure:  104/70 Sitting, Right arm, regular cuff  , 100/70 Standing, Right arm and regular cuff   Pulse:  68/min. Weight:  216.00 lbs. Height:  68"BMI: 33  Constitutional:  pleasant white male in no acute distress, mildly obese Skin:  warm and dry to touch, no apparent skin lesions, or masses noted. Head:  normocephalic, normal hair pattern, no masses or tenderness Neck:  supple, without massess. No JVD, thyromegaly or carotid bruits.  Carotid upstroke normal. Chest:  normal symmetry, clear to auscultation. Cardiac:  regular rhythm, normal S1 and S2, No S3 or S4, no murmurs, gallops or rubs detected. Peripheral Pulses:  the femoral,dorsalis pedis, and posterior tibial pulses are full and equal bilaterally with no bruits auscultated. Extremities & Back:  mild bilateral venous insufficiency changes present Neurological:  no gross motor or sensory deficits noted, affect appropriate, oriented x3. ____________________________ MOST RECENT LIPID PANEL 03/21/14  CHOL TOTL 176 mg/dl, LDL 115 NM, HDL 35 mg/dl, TRIGLYCER 123 mg/dl, ALT 34 u/l, ALK PHOS 59 u/l, CHOL/HDL 5.0 (Calc) and AST 28 u/l ____________________________ IMPRESSIONS/PLAN  1. Paroxysmal atrial fibrillation with frequent episodes that are symptomatic 2. Hypertension 3. Sleep apnea currently untreated 4. Obesity  Recommendations:  Again prolonged discussion about atrial fibrillation including ablation and its risks and chances of success with the patient and his wife who is here today for the first time. Echocardiogram reviewed with him that shows mild to moderate LVH, mild left atrial enlargement.  He asked about having an ablation. He has not been on any antiarrhythmics and I recommended initiation of flecainide 50 mg twice daily and in the meantime we'll asked him to get an appointment with the electrophysiologist. He may take a while to get him in there so we will go ahead and initiate treatment with flecainide twice a day. Asked him to take his Eliquus twice daily and to increase his metoprolol to twice daily. Followup in one month.  Also recommended that he follow back up with the sleep apnea Dr. determine if there are other treatments that could be used for his sleep apnea now. EKG shows sinus rhythm with frequent PAC's. ____________________________ TODAYS ORDERS  1. 12 Lead EKG: Today  2.  Consult Dr. Rayann Heman: Schedule aSAP  3. Return Visit: 1 month                        ____________________________ Cardiology Physician:  Kerry Hough MD [facility]

## 2014-08-13 NOTE — Progress Notes (Signed)
Patient ID: VIVIANO BIR, male   DOB: Oct 15, 1935, 79 y.o.   MRN: [id number]   [place], [place]    Date of visit:  08/13/2014 DOB:  1935/07/17    Age:  79 yrs. Medical record number:  [id number]     Account number:  [id number] Primary Care Provider: Maury Dus ____________________________ CURRENT DIAGNOSES  1. Paroxysmal atrial fibrillation  2. Long term (current) use of anticoagulants  3. Sleep apnea  4. Essential hypertension  5. Obesity ____________________________ ALLERGIES  Lisinopril, Intolerance-cough  Losartan, Intolerance-cough  Oxycodone, Mood swings ____________________________ MEDICATIONS  1. multivitamin tablet, 1 p.o. daily  2. Vitamin D3 1,000 unit tablet, 1 p.o. daily  3. Glucosamine 1500 Complex 500 mg-400 mg capsule, 1 p.o. daily  4. ibuprofen 200 mg tablet, PRN  5. EpiPen Jr 2-Pak 0.15 mg/0.3 mL injection,auto-injector, PRN  6. tamsulosin 0.4 mg capsule, 1 p.o. daily  7. valsartan 160 mg tablet, 1 p.o. daily  8. colchicine 0.6 mg tablet, 1 p.o. daily  9. flecainide 50 mg tablet, BID  10. metoprolol tartrate 50 mg tablet, BID ____________________________ CHIEF COMPLAINTS  Followup of Paroxysmal atrial fibrillation ____________________________ HISTORY OF PRESENT ILLNESS Patient seen for cardiac followup. He has tolerated the use of flecainide fairly well and does not know whether he has been in atrial fibrillation or not. He had recent bladder surgery and has had to stop the Eliquis because of hematuria.  He denies PND, orthopnea or edema. He has not been to see the sleep apnea doctor at the present time. He has tried exercise and has been involved in normal activities otherwise. ____________________________ PAST HISTORY  Past Medical Illnesses:  kidney stones, prostate cancer, sleep apnea, bladder cancer, gilberts syndrome, hypertension, obesity, BPH;  Cardiovascular Illnesses:  atrial flutter-paroxysmal;  Surgical Procedures:  hernia repair, laminectomy lumbar, fx l  fibula, bladder surg, vasectomy;  NYHA Classification:  I;  Canadian Angina Classification:  Class 0: Asymptomatic;  Cardiology Procedures-Invasive:  no history of prior cardiac procedures;  Cardiology Procedures-Noninvasive:  treadmill, event monitor April 2016, echocardiogram April 2016;  LVEF of 55% documented via echocardiogram on 06/07/2014,   CHA2DS2-VASC Score:  3 ____________________________ CARDIO-PULMONARY TEST DATES EKG Date:  07/16/2014;  Holter/Event Monitor Date: 05/17/2014;  Echocardiography Date: 06/07/2014;   ____________________________ FAMILY HISTORY Father -- Father dead, Heart Attack Mother -- Mother dead, Leukemia Sister -- Sister alive and well ____________________________ SOCIAL HISTORY Alcohol Use:  wine 1 per day;  Smoking:  never smoked;  Lifestyle:  divorced, remarried and 2 daughters;  Exercise:  1-2 times q week;  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 Cardiovascular:  please review HPI Abdominal: denies dyspepsia, GI bleeding, constipation, or diarrhea Genitourinary-Male: nocturia, recent bladder surgery and BCG  Musculoskeletal:  lumbar disc disease, Baker's cyst Neurological:  denies headaches, stroke, or TIA  ____________________________ PHYSICAL EXAMINATION VITAL SIGNS  Blood Pressure:  104/60 Sitting, Right arm, regular cuff  , 110/66 Standing, Right arm and regular cuff   Pulse:  64/min. Weight:  212.00 lbs. Height:  68"BMI: 32  Constitutional:  pleasant white male in no acute distress, mildly obese Skin:  warm and dry to touch, no apparent skin lesions, or masses noted. Head:  normocephalic, normal hair pattern, no masses or tenderness Neck:  supple, without massess. No JVD, thyromegaly or carotid bruits. Carotid  upstroke normal. Chest:  normal symmetry, clear to auscultation. Cardiac:  regular rhythm, normal S1 and S2, No S3 or S4, no murmurs, gallops or rubs detected. Peripheral Pulses:  the femoral,dorsalis pedis, and posterior tibial pulses are full and equal bilaterally with no bruits auscultated. Extremities & Back:  mild bilateral venous insufficiency changes present Neurological:  no gross motor or sensory deficits noted, affect appropriate, oriented x3. ____________________________ MOST RECENT LIPID PANEL 03/21/14  CHOL TOTL 176 mg/dl, LDL 115 NM, HDL 35 mg/dl, TRIGLYCER 123 mg/dl, CHOL/HDL 5.0 (Calc)  ____________________________ IMPRESSIONS/PLAN  1. Paroxysmal atrial fibrillation clinical suppression but he was asymptomatic before so difficult to tell 2. Long term anticoagulation with Eliquus currently not taking due to recent urologic procedure 3. Sleep apnea currently untreated 4. Obesity  Recommendations:  Obtain treadmill test to evaluate effects of flecainide. Advised to get a consultation with sleep doctor. We'll check again on referral to electrophysiologist that was made on last visit. Get back on anticoagulation when safe to do so from urologic viewpoint ____________________________ TODAYS ORDERS  1. treadmill:  Regular TM First Available                       ____________________________ Cardiology Physician:  Kerry Hough MD [facility]

## 2014-08-18 ENCOUNTER — Emergency Department (HOSPITAL_COMMUNITY)
Admission: EM | Admit: 2014-08-18 | Discharge: 2014-08-18 | Disposition: A | Discharge status: Home / Self Care | Payer: Medicare Other | Attending: Emergency Medicine | Admitting: Emergency Medicine

## 2014-08-18 ENCOUNTER — Encounter (HOSPITAL_COMMUNITY): Payer: Self-pay | Admitting: Emergency Medicine

## 2014-08-18 DIAGNOSIS — Z8546 Personal history of malignant neoplasm of prostate: Secondary | ICD-10-CM | POA: Insufficient documentation

## 2014-08-18 DIAGNOSIS — I129 Hypertensive chronic kidney disease with stage 1 through stage 4 chronic kidney disease, or unspecified chronic kidney disease: Secondary | ICD-10-CM | POA: Diagnosis not present

## 2014-08-18 DIAGNOSIS — T63481A Toxic effect of venom of other arthropod, accidental (unintentional), initial encounter: Secondary | ICD-10-CM | POA: Diagnosis present

## 2014-08-18 DIAGNOSIS — N189 Chronic kidney disease, unspecified: Secondary | ICD-10-CM | POA: Diagnosis not present

## 2014-08-18 DIAGNOSIS — T7840XA Allergy, unspecified, initial encounter: Secondary | ICD-10-CM

## 2014-08-18 DIAGNOSIS — Y998 Other external cause status: Secondary | ICD-10-CM | POA: Diagnosis not present

## 2014-08-18 DIAGNOSIS — Z7902 Long term (current) use of antithrombotics/antiplatelets: Secondary | ICD-10-CM | POA: Insufficient documentation

## 2014-08-18 DIAGNOSIS — Y929 Unspecified place or not applicable: Secondary | ICD-10-CM | POA: Insufficient documentation

## 2014-08-18 DIAGNOSIS — Y939 Activity, unspecified: Secondary | ICD-10-CM | POA: Diagnosis not present

## 2014-08-18 DIAGNOSIS — Z9981 Dependence on supplemental oxygen: Secondary | ICD-10-CM | POA: Insufficient documentation

## 2014-08-18 DIAGNOSIS — K219 Gastro-esophageal reflux disease without esophagitis: Secondary | ICD-10-CM | POA: Diagnosis not present

## 2014-08-18 DIAGNOSIS — G4733 Obstructive sleep apnea (adult) (pediatric): Secondary | ICD-10-CM | POA: Insufficient documentation

## 2014-08-18 DIAGNOSIS — H9193 Unspecified hearing loss, bilateral: Secondary | ICD-10-CM | POA: Diagnosis not present

## 2014-08-18 MED ORDER — FAMOTIDINE IN NACL 20-0.9 MG/50ML-% IV SOLN
20.0000 mg | Freq: Once | INTRAVENOUS | Status: AC
  Administered 2014-08-18: 20 mg via INTRAVENOUS
  Filled 2014-08-18: qty 50

## 2014-08-18 MED ORDER — METHYLPREDNISOLONE SODIUM SUCC 125 MG IJ SOLR
125.0000 mg | Freq: Once | INTRAMUSCULAR | Status: AC
  Administered 2014-08-18: 125 mg via INTRAVENOUS
  Filled 2014-08-18: qty 2

## 2014-08-18 MED ORDER — DIPHENHYDRAMINE HCL 25 MG PO TABS: 25.0000 mg | ORAL_TABLET | Freq: Four times a day (QID) | ORAL | Status: DC | PRN

## 2014-08-18 MED ORDER — EPINEPHRINE 0.3 MG/0.3ML IJ SOAJ: 0.3000 mg | Freq: Once | INTRAMUSCULAR | Status: AC

## 2014-08-18 MED ORDER — SODIUM CHLORIDE 0.9 % IV BOLUS (SEPSIS)
1000.0000 mL | Freq: Once | INTRAVENOUS | Status: AC
  Administered 2014-08-18: 1000 mL via INTRAVENOUS

## 2014-08-18 NOTE — Discharge Instructions (Signed)
Please follow up with your primary care physician in 1-2 days. If you do not have one please call the [facility] number listed above. Please read all discharge instructions and return precautions.  ° ° °Anaphylactic Reaction °An anaphylactic reaction is a sudden, severe allergic reaction that involves the whole body. It can be life threatening. A hospital stay is often required. People with asthma, eczema, or hay fever are slightly more likely to have an anaphylactic reaction. °CAUSES  °An anaphylactic reaction may be caused by anything to which you are allergic. After being exposed to the allergic substance, your immune system becomes sensitized to it. When you are exposed to that allergic substance again, an allergic reaction can occur. Common causes of an anaphylactic reaction include: °· Medicines. °· Foods, especially peanuts, wheat, shellfish, milk, and eggs. °· Insect bites or stings. °· Blood products. °· Chemicals, such as dyes, latex, and contrast material used for imaging tests. °SYMPTOMS  °When an allergic reaction occurs, the body releases histamine and other substances. These substances cause symptoms such as tightening of the airway. Symptoms often develop within seconds or minutes of exposure. Symptoms may include: °· Skin rash or hives. °· Itching. °· Chest tightness. °· Swelling of the eyes, tongue, or lips. °· Trouble breathing or swallowing. °· Lightheadedness or fainting. °· Anxiety or confusion. °· Stomach pains, vomiting, or diarrhea. °· Nasal congestion. °· A fast or irregular heartbeat (palpitations). °DIAGNOSIS  °Diagnosis is based on your history of recent exposure to allergic substances, your symptoms, and a physical exam. Your caregiver may also perform blood or urine tests to confirm the diagnosis. °TREATMENT  °Epinephrine medicine is the main treatment for an anaphylactic reaction. Other medicines that may be used for treatment include antihistamines, steroids, and  albuterol. In severe cases, fluids and medicine to support blood pressure may be given through an intravenous line (IV). Even if you improve after treatment, you need to be observed to make sure your condition does not get worse. This may require a stay in the hospital. °HOME CARE INSTRUCTIONS  °· Wear a medical alert bracelet or necklace stating your allergy. °· You and your family must learn how to use an anaphylaxis kit or give an epinephrine injection to temporarily treat an emergency allergic reaction. Always carry your epinephrine injection or anaphylaxis kit with you. This can be lifesaving if you have a severe reaction. °· Do not drive or perform tasks after treatment until the medicines used to treat your reaction have worn off, or until your caregiver says it is okay. °· If you have hives or a rash: °¨ Take medicines as directed by your caregiver. °¨ You may use an over-the-counter antihistamine (diphenhydramine) as needed. °¨ Apply cold compresses to the skin or take baths in cool water. Avoid hot baths or showers. °SEEK MEDICAL CARE IF:  °· You develop symptoms of an allergic reaction to a new substance. Symptoms may start right away or minutes later. °· You develop a rash, hives, or itching. °· You develop new symptoms. °SEEK IMMEDIATE MEDICAL CARE IF:  °· You have swelling of the mouth, difficulty breathing, or wheezing. °· You have a tight feeling in your chest or throat. °· You develop hives, swelling, or itching all over your body. °· You develop severe vomiting or diarrhea. °· You feel faint or pass out. °This is an emergency. Use your epinephrine injection or anaphylaxis kit as you have been instructed. Call your local emergency services (911 in [place] Even   you improve after the injection, you need to be examined at a hospital emergency department. MAKE SURE YOU:   Understand these instructions.  Will watch your condition.  Will get help right away if you are not doing well or get  worse. Document Released: 01/25/2005 Document Revised: 01/30/2013 Document Reviewed: 04/28/2011 [facility] Patient Information 2015 [place], [place]. This information is not intended to replace advice given to you by your health care provider. Make sure you discuss any questions you have with your health care provider.

## 2014-08-18 NOTE — ED Notes (Addendum)
Pt c/o SOB and left ear pain and edema radiating to left face after being stung by unknown insect. Pt states he has Hx of anaphylactic reaction to bee venom in past. Pt took epi-pen injection immediately after incident, along with one Benadryl pill. Lung sounds clear in all lobes, except rhonchi to right upper anterior lobe.

## 2014-08-18 NOTE — ED Provider Notes (Signed)
CSN: [id number]     Arrival date & time 08/18/14  1651 History   First MD Initiated Contact with Patient 08/18/14 1702     Chief Complaint  Patient presents with  . Allergic Reaction     (Consider location/radiation/quality/duration/timing/severity/associated sxs/prior Treatment) HPI Comments: Pt c/o SOB and left ear pain and edema radiating to left face after being stung by unknown insect. Pt states he has Hx of anaphylactic reaction to bee venom in past. Pt took epi-pen injection immediately after incident, along with one Benadryl pill at approximately 4PM this afternoon. Denies any CP, vomiting, diarrhea, cramping abdominal pain.   Patient is a 79 y.o. male presenting with allergic reaction. The history is provided by the patient and the spouse.  Allergic Reaction Presenting symptoms: difficulty breathing, itching, rash and swelling   Difficulty breathing:    Onset quality:  Sudden   Progression:  Improving Itching:    Location:  Full body   Onset quality:  Sudden   Progression:  Improving Rash:    Location:  Full body   Quality: itchiness and redness     Onset quality:  Sudden   Progression:  Improving Severity:  Severe Prior allergic episodes:  Insect allergies Context: insect bite/sting   Relieved by:  Antihistamines, epinephrine and cold compresses Worsened by:  Nothing tried Ineffective treatments:  None tried   Past Medical History  Diagnosis Date  . Hypertension   . Hyperlipidemia   . Gilbert's syndrome   . Wears glasses   . OSA (obstructive sleep apnea)     2011 -- POSITIVE STUDY W/ CPAP RX BUT NON-COMPLIANT  . Chronic kidney disease     stones  "they come and go"  . GERD (gastroesophageal reflux disease)     takes  OTC periodically  . Hearing loss of both ears     bilateral hearing aids  . Dysrhythmia     afib  . Bladder cancer     prostate cancer   Past Surgical History  Procedure Laterality Date  . Orif left ankle fx  2005  . Bilateral inguinal  hernia repair    . Cysto/ left retrograde pyelogram/ ureteroscopy / brush bx/ stent placement  05-01-2008  DR [facility]  . Cysto/ bladder bx's/ left ureteral stricture dilation/ right retrograde pyelogram/ left stent placement  05-15-2008  DR [facility]  . Transurethral resection of bladder tumor  10/22/2011    Procedure: TRANSURETHRAL RESECTION OF BLADDER TUMOR (TURBT);  Surgeon: Fredricka Bonine, MD;  Location: [facility];  Service: Urology;  Laterality: N/A;  TURBT, LEFT URETEROSCOPY, POSSIBLE URETERAL STENT C-ARM   . Ureteroscopy  10/22/2011    Procedure: URETEROSCOPY;  Surgeon: Fredricka Bonine, MD;  Location: [facility];  Service: Urology;  Laterality: Left;  . Prostate biopsy N/A 08/22/2012    Procedure: BIOPSY TRANSRECTAL ULTRASONIC PROSTATE (TUBP);  Surgeon: Fredricka Bonine, MD;  Location: [facility];  Service: Urology;  Laterality: N/A;  . Cystoscopy w/ retrogrades Left 08/22/2012    Procedure: CYSTOSCOPY WITH RETROGRADE PYELOGRAM;  left kidney washings;  Surgeon: Fredricka Bonine, MD;  Location: [facility];  Service: Urology;  Laterality: Left;  . Cystoscopy with biopsy  08/22/2012    Procedure: CYSTOSCOPY WITH BIOPSY;  Surgeon: Fredricka Bonine, MD;  Location: [facility];  Service: Urology;;  . Cystoscopy/retrograde/ureteroscopy Bilateral 08/17/2013    Procedure: CYSTOSCOPY, BLADDER BIOPSY WITH BILATERAL RETROGRADE PYELOGRAM, LEFT URETEROSCOPY AND DILATION OF STRICTURE ;  Surgeon: Rodman Key  Junious Silk, MD;  Location: [facility];  Service: Urology;  Laterality: Bilateral;  . Fracture surgery      left leg  . Hernia repair      bilateral inguinals  . Lumbar laminectomy/decompression microdiscectomy Left 12/03/2013    Procedure: Left Lumbar three-four diskectomy with Lumbar four laminectomy ;  Surgeon: Newman Pies, MD;  Location: [place] [facility];  Service:  Neurosurgery;  Laterality: Left;  Left Lumbar three-four diskectomy with Lumbar four laminectomy   . Wisdom tooth extraction    . Cystoscopy with retrograde pyelogram, ureteroscopy and stent placement Bilateral 07/30/2014    Procedure: CYSTOSCOPY WITH BLADDER BX [place],;  Surgeon: [name], MD;  Location: [facility];  Service: Urology;  Laterality: Bilateral;   Family History  Problem Relation Age of Onset  . Leukemia Mother    History  Substance Use Topics  . Smoking status: Never Smoker   . Smokeless tobacco: Never Used  . Alcohol Use: 4.2 oz/week    7 Glasses of wine per week     Comment: red wine glass daily    Review of Systems  HENT: Positive for facial swelling (left ear).   Respiratory: Positive for chest tightness and shortness of breath.   Cardiovascular: Negative for chest pain.  Gastrointestinal: Negative for vomiting, abdominal pain and diarrhea.  Skin: Positive for itching and rash.  All other systems reviewed and are negative.     Allergies  Bee venom; Losartan; and Oxycodone  Home Medications   Prior to Admission medications   Medication Sig Start Date End Date Taking? Authorizing Provider  allopurinol (ZYLOPRIM) 300 MG tablet Take 300 mg by mouth 2 (two) times daily.   Yes Historical Provider, MD  Cholecalciferol (VITAMIN D-3) 1000 UNITS CAPS Take 1,000 Units by mouth 2 (two) times daily.    Yes Historical Provider, MD  diphenhydrAMINE (SOMINEX) 25 MG tablet Take 25 mg by mouth once.   Yes Historical Provider, MD  flecainide (TAMBOCOR) 50 MG tablet Take 50 mg by mouth 2 (two) times daily.   Yes Historical Provider, MD  Glucosamine Sulfate (DONA PO) Take 1 tablet by mouth daily.    Yes Historical Provider, MD  ibuprofen (ADVIL,MOTRIN) 200 MG tablet Take 200-800 mg by mouth every 6 (six) hours as needed for mild pain.   Yes Historical Provider, MD  metoprolol (LOPRESSOR) 50 MG tablet Take 50 mg by mouth 2 (two) times  daily.   Yes Historical Provider, MD  Multiple Vitamin (MULTIVITAMIN) tablet Take 1 tablet by mouth daily.    Yes Historical Provider, MD  valsartan (DIOVAN) 160 MG tablet Take 160 mg by mouth every morning.    Yes Historical Provider, MD  apixaban (ELIQUIS) 5 MG TABS tablet Take 1 tablet (5 mg total) by mouth 2 (two) times daily. Patient not taking: Reported on 08/18/2014 08/01/14   [name], MD  diphenhydrAMINE (BENADRYL) 25 MG tablet Take 1 tablet (25 mg total) by mouth every 6 (six) hours as needed for itching (Rash). 08/18/14   Amyrah Pinkhasov, PA-C  EPINEPHrine 0.3 mg/0.3 mL IJ SOAJ injection Inject 0.3 mLs (0.3 mg total) into the muscle once. 08/18/14   Davieon Stockham, PA-C  URELLE (URELLE/URISED) 81 MG TABS tablet Take 1 tablet (81 mg total) by mouth every 6 (six) hours as needed for bladder spasms. Patient not taking: Reported on 08/18/2014 07/30/14   [name], MD   BP 109/66 mmHg  Pulse 65  Temp(Src) 97.6 F (36.4 C) (Oral)  Resp  22  SpO2 94% Physical Exam  Constitutional: He is oriented to person, place, and time. He appears well-developed and well-nourished. No distress.  HENT:  Head: Normocephalic and atraumatic.  Right Ear: External ear normal.  Left Ear: External ear normal.  Nose: Nose normal.  Mouth/Throat: Oropharynx is clear and moist. No oropharyngeal exudate.  No angioedema. No intraoral swelling.  Eyes: Conjunctivae are normal.  Neck: Normal range of motion. Neck supple.  No nuchal rigidity.   Cardiovascular: Normal rate, regular rhythm and normal heart sounds.   Pulmonary/Chest: Effort normal and breath sounds normal. No respiratory distress. He has no wheezes.  Abdominal: Soft. There is no tenderness.  Musculoskeletal: Normal range of motion.  Neurological: He is alert and oriented to person, place, and time.  Skin: Skin is warm and dry. Rash (urticaria full body (face spared)) noted. He is not diaphoretic.  Psychiatric: He has a normal  mood and affect.  Nursing note and vitals reviewed.   ED Course  Procedures (including critical care time) Medications  sodium chloride 0.9 % bolus 1,000 mL (0 mLs Intravenous Stopped 08/18/14 1909)  methylPREDNISolone sodium succinate (SOLU-MEDROL) 125 mg/2 mL injection 125 mg (125 mg Intravenous Given 08/18/14 1716)  famotidine (PEPCID) IVPB 20 mg premix (0 mg Intravenous Stopped 08/18/14 1753)    Labs Review Labs Reviewed - No data to display  Imaging Review No results found.   EKG Interpretation None      MDM   Final diagnoses:  Allergic reaction, initial encounter    Filed Vitals:   08/18/14 1830  BP: 109/66  Pulse: 65  Temp:   Resp: 22   Afebrile, NAD, non-toxic appearing, AAOx4.   Patient re-evaluated prior to dc, is hemodynamically stable, in no respiratory distress, and denies the feeling of throat closing. Pt has been advised to take OTC benadryl & return to the ED if they have a mod-severe allergic rxn (s/s including throat closing, difficulty breathing, swelling of lips face or tongue). Pt is to follow up with their PCP. Pt is agreeable with plan & verbalizes understanding.   Patient d/w with Dr. Tawnya Crook, agrees with plan.    Baron Sane, PA-C 08/18/14 5170  Ernestina Patches, MD 08/21/14 [phone]

## 2014-09-09 ENCOUNTER — Ambulatory Visit (INDEPENDENT_AMBULATORY_CARE_PROVIDER_SITE_OTHER): Payer: Medicare Other | Admitting: Internal Medicine

## 2014-09-09 ENCOUNTER — Encounter: Payer: Self-pay | Admitting: Internal Medicine

## 2014-09-09 ENCOUNTER — Other Ambulatory Visit: Payer: Self-pay

## 2014-09-09 VITALS — BP 102/62 | HR 66 | Ht 67.5 in | Wt 218.8 lb

## 2014-09-09 DIAGNOSIS — I480 Paroxysmal atrial fibrillation: Secondary | ICD-10-CM | POA: Diagnosis not present

## 2014-09-09 DIAGNOSIS — G4730 Sleep apnea, unspecified: Secondary | ICD-10-CM

## 2014-09-09 DIAGNOSIS — I119 Hypertensive heart disease without heart failure: Secondary | ICD-10-CM

## 2014-09-09 NOTE — Patient Instructions (Signed)
Medication Instructions:  Your physician recommends that you continue on your current medications as directed. Please refer to the Current Medication list given to you today.   Labwork: None ordered   Testing/Procedures: None ordered   Follow-Up: Your physician recommends that you schedule a follow-up appointment as needed    Any Other Special Instructions Will Be Listed Below (If Applicable).   

## 2014-09-10 ENCOUNTER — Encounter: Payer: Self-pay | Admitting: Internal Medicine

## 2014-09-10 NOTE — Progress Notes (Signed)
Electrophysiology Office Note   Date:  09/10/2014   ID:  Charles Wright, DOB May 10, 1935, MRN [id number]  PCP:  Vena Austria, MD  Cardiologist:  Dr Wynonia Lawman Primary Electrophysiologist: [name], MD    Chief Complaint  Patient presents with  . PAF     History of Present Illness: Charles Wright is a 79 y.o. male who presents today for electrophysiology evaluation.   The patient reports previous episodes of afib, now improved with flecainide.  He has previously been evaluated by Dr Wynonia Lawman and has worn a cardiac event monitor that has shown frequent episodes of paroxysmal atrial fibrillation on many days. He had atrial fibrillation 16/30 days he wore the monitor. He also had frequent PACs noted. He does complain of some fatigue He was placed on eliquis but had hematuria and subsequent diagnosis of bladder ca for which he is now receiving treatment.    He does have a history of sleep apnea evaluated many years ago but states that he was unable to wear his CPAP device because of difficulty finding a mask that would fit. Drinks one glass of alcohol per day.  Today, he denies symptoms of palpitations, chest pain, shortness of breath, orthopnea, PND, lower extremity edema, claudication, dizziness, presyncope, syncope, bleeding, or neurologic sequela. The patient is tolerating medications without difficulties and is otherwise without complaint today.    Past Medical History  Diagnosis Date  . Hypertension   . Hyperlipidemia   . Gilbert's syndrome   . Wears glasses   . OSA (obstructive sleep apnea)     2011 -- POSITIVE STUDY W/ CPAP RX BUT NON-COMPLIANT  . Chronic kidney disease     stones  "they come and go"  . GERD (gastroesophageal reflux disease)     takes  OTC periodically  . Hearing loss of both ears     bilateral hearing aids  . Paroxysmal a-fib   . Bladder cancer     prostate cancer   Past Surgical History  Procedure Laterality Date  . Orif left ankle fx  2005  .  Bilateral inguinal hernia repair    . Cysto/ left retrograde pyelogram/ ureteroscopy / brush bx/ stent placement  05-01-2008  DR [facility]  . Cysto/ bladder bx's/ left ureteral stricture dilation/ right retrograde pyelogram/ left stent placement  05-15-2008  DR [facility]  . Transurethral resection of bladder tumor  10/22/2011    Procedure: TRANSURETHRAL RESECTION OF BLADDER TUMOR (TURBT);  Surgeon: Fredricka Bonine, MD;  Location: [facility];  Service: Urology;  Laterality: N/A;  TURBT, LEFT URETEROSCOPY, POSSIBLE URETERAL STENT C-ARM   . Ureteroscopy  10/22/2011    Procedure: URETEROSCOPY;  Surgeon: Fredricka Bonine, MD;  Location: [facility];  Service: Urology;  Laterality: Left;  . Prostate biopsy N/A 08/22/2012    Procedure: BIOPSY TRANSRECTAL ULTRASONIC PROSTATE (TUBP);  Surgeon: Fredricka Bonine, MD;  Location: [facility];  Service: Urology;  Laterality: N/A;  . Cystoscopy w/ retrogrades Left 08/22/2012    Procedure: CYSTOSCOPY WITH RETROGRADE PYELOGRAM;  left kidney washings;  Surgeon: Fredricka Bonine, MD;  Location[facility];  Service: Urology;  Laterality: Left;  . Cystoscopy with biopsy  08/22/2012    Procedure: CYSTOSCOPY WITH BIOPSY;  Surgeon: Fredricka Bonine, MD;  Location: [facility]  Service: Urology;;  . Cystoscopy/retrograde/ureteroscopy Bilateral 08/17/2013    Procedure: CYSTOSCOPY, BLADDER BIOPSY WITH BILATERAL RETROGRADE PYELOGRAM, LEFT URETEROSCOPY AND DILATION OF STRICTURE ;  Surgeon: Rodman Key  Junious Silk, MD;  Location: [facility];  Service: Urology;  Laterality: Bilateral;  . Fracture surgery      left leg  . Hernia repair      bilateral inguinals  . Lumbar laminectomy/decompression microdiscectomy Left 12/03/2013    Procedure: Left Lumbar three-four diskectomy with Lumbar four laminectomy ;  Surgeon: Newman Pies, MD;  Location: [place] [facility];   Service: Neurosurgery;  Laterality: Left;  Left Lumbar three-four diskectomy with Lumbar four laminectomy   . Wisdom tooth extraction    . Cystoscopy with retrograde pyelogram, ureteroscopy and stent placement Bilateral 07/30/2014    Procedure: CYSTOSCOPY WITH BLADDER BX [place],;  Surgeon: [name], MD;  Location: [facility];  Service: Urology;  Laterality: Bilateral;     Current Outpatient Prescriptions  Medication Sig Dispense Refill  . allopurinol (ZYLOPRIM) 300 MG tablet Take 300 mg by mouth daily.     . Cholecalciferol (VITAMIN D-3) 1000 UNITS CAPS Take 1,000 Units by mouth 2 (two) times daily.     . diphenhydrAMINE (BENADRYL) 25 MG tablet Take 1 tablet (25 mg total) by mouth every 6 (six) hours as needed for itching (Rash). 30 tablet 0  . EPINEPHrine 0.3 mg/0.3 mL IJ SOAJ injection Inject 0.3 mLs (0.3 mg total) into the muscle once. 2 Device 1  . flecainide (TAMBOCOR) 50 MG tablet Take 50 mg by mouth 2 (two) times daily.    . Glucosamine Sulfate (DONA PO) Take 1 tablet by mouth daily.     Marland Kitchen ibuprofen (ADVIL,MOTRIN) 200 MG tablet Take 200-800 mg by mouth every 6 (six) hours as needed for mild pain.    . metoprolol (LOPRESSOR) 50 MG tablet Take 50 mg by mouth 2 (two) times daily.    . Multiple Vitamin (MULTIVITAMIN) tablet Take 1 tablet by mouth daily.     . tamsulosin (FLOMAX) 0.4 MG CAPS capsule Take 1 capsule by mouth daily.  1  . valsartan (DIOVAN) 160 MG tablet Take 160 mg by mouth every morning.      No current facility-administered medications for this visit.    Allergies:   Bee venom; Losartan; and Oxycodone   Social History:  The patient  reports that he has never smoked. He has never used smokeless tobacco. He reports that he drinks about 4.2 oz of alcohol per week. He reports that he does not use illicit drugs.   Family History:  The patient's  family history includes Cancer in his sister; Heart attack in his father; Leukemia in  his mother.    ROS:  Please see the history of present illness.   All other systems are reviewed and negative.    PHYSICAL EXAM: VS:  BP 102/62 mmHg  Pulse 66  Ht 5' 7.5" (1.715 m)  Wt 99.247 kg (218 lb 12.8 oz)  BMI 33.74 kg/m2 , BMI Body mass index is 33.74 kg/(m^2). GEN: Well nourished, well developed, in no acute distress HEENT: normal Neck: no JVD, carotid bruits, or masses Cardiac: RRR; no murmurs, rubs, or gallops,no edema  Respiratory:  clear to auscultation bilaterally, normal work of breathing GI: soft, nontender, nondistended, + BS MS: no deformity or atrophy Skin: warm and dry  Neuro:  Strength and sensation are intact Psych: euthymic mood, full affect  EKG:  EKG is ordered today. The ekg ordered today shows sinus rhythm   Recent Labs: 11/29/2013: ALT 25 07/26/2014: BUN 20; Creatinine, Ser 1.14; Hemoglobin 15.1; Platelets 341; Potassium 4.7; Sodium 140    Lipid Panel  No results found for: CHOL, TRIG, HDL, CHOLHDL, VLDL, LDLCALC, LDLDIRECT   Wt Readings from Last 3 Encounters:  09/09/14 99.247 kg (218 lb 12.8 oz)  07/30/14 97.523 kg (215 lb)  07/26/14 97.523 kg (215 lb)      Other studies Reviewed: Additional studies/ records that were reviewed today include: Dr Thurman Coyer notes    ASSESSMENT AND PLAN:  1.  Paroxysmal atrial fibrillation The patient has symptomatic paroxysmal atrial fibrillation.  He presently feels that his afib is well controlled with flecainide.  Therapeutic strategies for afib including medicine and ablation were discussed in detail with the patient today. Risk, benefits, and alternatives to EP study and radiofrequency ablation for afib were also discussed in detail today. As his afib is presently controlled with flecainide, he would prefer to continue this strategy going forward. Chads2vasc score is at least 3.  He is not presently anticoagulated due to hematuria. We discussed left atrial appendage occluder devices at length today.   If he is unable to resume lifelong anticoagulation after bladder treatment, we may consider Watchman, though this would require at least several months of anticoagulation.  2. OSA Compliance with CPAP is encouraged  3. HTN Stable No change required today  Follow-up with Dr Wynonia Lawman as scheduled If he decides to reconsider ablation in the future (if he fails flecainide) then I am happy to see him again at that time.  Current medicines are reviewed at length with the patient today.   The patient does not have concerns regarding his medicines.  The following changes were made today:  none  Signed, [name], MD  09/10/2014 10:58 PM     [address] [place] [place] [address] [phone] (office) [phone] (fax)

## 2014-10-25 ENCOUNTER — Telehealth: Payer: Self-pay | Admitting: *Deleted

## 2014-10-25 NOTE — Telephone Encounter (Signed)
Per Dr Megan Salon the patient notified of appt 10/28/14 at 1030 am and given address and phone number.

## 2014-10-28 ENCOUNTER — Encounter: Payer: Self-pay | Admitting: Internal Medicine

## 2014-10-28 ENCOUNTER — Ambulatory Visit (INDEPENDENT_AMBULATORY_CARE_PROVIDER_SITE_OTHER): Payer: Medicare Other | Admitting: Internal Medicine

## 2014-10-28 VITALS — BP 125/75 | HR 60 | Temp 98.2°F | Ht 67.0 in | Wt 206.0 lb

## 2014-10-28 DIAGNOSIS — R350 Frequency of micturition: Secondary | ICD-10-CM

## 2014-10-28 DIAGNOSIS — I10 Essential (primary) hypertension: Secondary | ICD-10-CM | POA: Diagnosis not present

## 2014-10-28 DIAGNOSIS — R300 Dysuria: Secondary | ICD-10-CM | POA: Diagnosis not present

## 2014-10-28 DIAGNOSIS — Z23 Encounter for immunization: Secondary | ICD-10-CM

## 2014-10-28 DIAGNOSIS — C675 Malignant neoplasm of bladder neck: Secondary | ICD-10-CM | POA: Diagnosis not present

## 2014-10-28 DIAGNOSIS — N35919 Unspecified urethral stricture, male, unspecified site: Secondary | ICD-10-CM | POA: Insufficient documentation

## 2014-10-29 DIAGNOSIS — R350 Frequency of micturition: Secondary | ICD-10-CM | POA: Insufficient documentation

## 2014-10-29 DIAGNOSIS — R300 Dysuria: Secondary | ICD-10-CM | POA: Insufficient documentation

## 2014-10-29 MED ORDER — RIFAMPIN 300 MG PO CAPS: 600.0000 mg | ORAL_CAPSULE | Freq: Every day | ORAL | Status: DC

## 2014-10-29 MED ORDER — ETHAMBUTOL HCL 400 MG PO TABS: 1200.0000 mg | ORAL_TABLET | Freq: Every day | ORAL | Status: DC

## 2014-10-29 NOTE — Progress Notes (Signed)
Patient ID: Charles Wright, male   DOB: [birth year], 79 y.o.   MRN: [id number]         [facility] for Infectious Disease  Reason for Consult: Urinary frequency and dysuria following bladder installation of BCG Referring Physician: Dr. Eda Keys  Patient Active Problem List   Diagnosis Date Noted  . Dysuria 10/29/2014    Priority: High  . Urinary frequency 10/29/2014    Priority: High  . Bladder cancer     Priority: High  . Urethral stricture 10/28/2014  . HTN (hypertension) 10/28/2014  . Paroxysmal atrial fibrillation 08/13/2014  . Long-term (current) use of anticoagulants 08/13/2014  . Obesity (BMI 30-39.9) 08/13/2014  . Hypertensive heart disease   . Sleep apnea   . Prostate cancer   . Lumbar stenosis with neurogenic claudication 12/03/2013  . Upper airway cough syndrome 01/27/2013    Patient's Medications  New Prescriptions   No medications on file  Previous Medications   ALLOPURINOL (ZYLOPRIM) 300 MG TABLET    Take 300 mg by mouth daily.    CHOLECALCIFEROL (VITAMIN D-3) 1000 UNITS CAPS    Take 1,000 Units by mouth 2 (two) times daily.    EPINEPHRINE 0.3 MG/0.3 ML IJ SOAJ INJECTION    Inject 0.3 mLs (0.3 mg total) into the muscle once.   FLECAINIDE (TAMBOCOR) 50 MG TABLET    Take 50 mg by mouth 2 (two) times daily.   GLUCOSAMINE SULFATE (DONA PO)    Take 1 tablet by mouth daily.    IBUPROFEN (ADVIL,MOTRIN) 200 MG TABLET    Take 200-800 mg by mouth every 6 (six) hours as needed for mild pain.   METHEN-HYOSC-METH BLUE-NA PHOS (UROGESIC-BLUE PO)    Take by mouth.   METOPROLOL (LOPRESSOR) 50 MG TABLET    Take 50 mg by mouth 2 (two) times daily.   MULTIPLE VITAMIN (MULTIVITAMIN) TABLET    Take 1 tablet by mouth daily.    SILODOSIN (RAPAFLO) 8 MG CAPS CAPSULE    Take 8 mg by mouth daily with breakfast.   VALSARTAN (DIOVAN) 160 MG TABLET    Take 160 mg by mouth every morning.   Modified Medications   No medications on file  Discontinued Medications   DIPHENHYDRAMINE  (BENADRYL) 25 MG TABLET    Take 1 tablet (25 mg total) by mouth every 6 (six) hours as needed for itching (Rash).   ISONIAZID (NYDRAZID) 300 MG TABLET       TAMSULOSIN (FLOMAX) 0.4 MG CAPS CAPSULE    Take 1 capsule by mouth daily.    Recommendations: 1. Urine for AFB stain and culture 2. AFB blood culture 3. Ethambutol and rifampin  4. Follow-up with me in one week  Assessment: I suspect that his persistent lower urinary tract symptoms are due to BCG, either through direct infection of the bladder or through secondary inflammation. I do not see any evidence of systemic spread of infection but will obtain AFB blood cultures as well as AFB urine cultures. He had a very unusual and rare reaction to [place] and does not feel he can tolerate a rechallenge. I have reviewed treatment options with him. His options are rather limited. Fluoroquinolones are contraindicated because of drug drug interaction with flecainide. Therefore, I recommend that we try oral ethambutol and rifampin. If he does not get good relief with that regimen I would consider adding a brief course of prednisone. Adjunct of steroid-induced have been used decrease the inflammation associated with BCG lateral installations.  HPI: Charles Basso  Wright is a 79 y.o. male with a history of bladder cancer. He started BCG therapy approximate 2 years ago. He had no problems tolerating his first 6 cycles. He started back this February with installations weekly 3. He started them again in July but started to have a dramatic increase in urinary frequency and dysuria. He is also bothered by urinary urgency. He states that he rarely sleeps more than 1 hour at a time before having to get up and go to the bathroom. He has not had any fever, chills or sweats. He's had a very poor appetite and estimates that he has lost about 6 pounds unintentionally. He was treated for a possible UTI last month with trimethoprim sulfamethoxazole and had no improvement. He was then  treated with a course of ciprofloxacin but again had no improvement. His urine cultures have been negative. He underwent cystoscopy recently which showed erythema and some edema studying the bladder wall. He started on isoniazid 300 mg daily on 10/18/2014 but stopped after 6 days. He states that the frequency and burning actually increased and he had severe pain in his ankles and knees. He states "that drug was miserable". He has found that taking ibuprofen 600 mg at bedtime decreases his discomfort from 5 out of 10 to 3 out of 10. He does not take any ibuprofen during the day because he states that he does not like taking medications.  Review of Systems: Constitutional: positive for anorexia, fatigue and weight loss, negative for chills, fevers and sweats Eyes: negative Ears, nose, mouth, throat, and face: negative Respiratory: positive for cough, negative for asthma, dyspnea on exertion, pleurisy/chest pain and sputum Cardiovascular: negative Gastrointestinal: positive for mild intermittent diarrhea that he attributes to stool softeners Genitourinary:as noted in history of present illness Integument/breast: negative for rash Hematologic/lymphatic: negative for lymphadenopathy Musculoskeletal:positive for arthralgias    Past Medical History  Diagnosis Date  . Hypertension   . Hyperlipidemia   . Gilbert's syndrome   . Wears glasses   . OSA (obstructive sleep apnea)     2011 -- POSITIVE STUDY W/ CPAP RX BUT NON-COMPLIANT  . Chronic kidney disease     stones  "they come and go"  . GERD (gastroesophageal reflux disease)     takes  OTC periodically  . Hearing loss of both ears     bilateral hearing aids  . Paroxysmal a-fib   . Bladder cancer     prostate cancer    Social History  Substance Use Topics  . Smoking status: Never Smoker   . Smokeless tobacco: Never Used  . Alcohol Use: 4.2 oz/week    7 Glasses of wine per week     Comment: red wine glass daily    Family History    Problem Relation Age of Onset  . Leukemia Mother   . Heart attack Father   . Cancer Sister    Allergies  Allergen Reactions  . Bee Venom Anaphylaxis    Actually Hornets and Wasps.   . Losartan Other (See Comments)    Cough   . Oxycodone Other (See Comments)    ALTERED MENTAL STATIS    OBJECTIVE: Filed Vitals:   10/28/14 1023  BP: 125/75  Pulse: 60  Temp: 98.2 F (36.8 C)  TempSrc: Oral  Height: 5\' 7"  (1.702 m)  Weight: 206 lb (93.441 kg)   Body mass index is 32.26 kg/(m^2).   General: He is alert and in no distress Skin: No rash Lymph nodes: No palpable adenopathy  Eyes: Pupils are equal, round and reactive to light Oral: No oropharyngeal lesions Lungs: Clear Cor: Regular S1 and S2 with no murmurs Abdomen: Soft and nontender with no palpable masses. Specifically, no CVA or suprapubic tenderness Joints and extremities: No swelling, redness, warmth noted in his knees or ankles. He has no pain with range of motion.  Microbiology: Recent Results (from the past 240 hour(s))  Acid Fast Smear+Cx/Rflx, Complete     Status: None (Preliminary result)   Collection Time: 10/28/14  2:33 PM  Result Value Ref Range Status   AFB Specimen Processing Concentration  Final   Acid Fast Smear Negative  Final    Comment: (NOTE) Performed At: [facility] [address] [place], [place] [id number] Lindon Romp MD VP:[id number]    Acid Fast Culture PENDING  Incomplete   Source of Sample URINE, RANDOM  Final    Michel Bickers, MD [place] [facility] [place] [facility] [phone] pager   [phone] cell 10/29/2014, 2:40 PM

## 2014-11-01 ENCOUNTER — Telehealth: Payer: Self-pay | Admitting: *Deleted

## 2014-11-01 NOTE — Telephone Encounter (Signed)
Call from [place] with [facility] stating that they were unable to result the blood cultures and AFB cultures because the blood was collected in the wrong tubes. I called the patient and advised him that the test would need to be done again and asked if he could come in next week. He has an appointment with Dr. Megan Salon on 9/28/ 16 and prefers to wait until then. He wanted Dr. Megan Salon to be aware that he is tolerating the medicine ok, however he has not had any improvement. Myrtis Hopping

## 2014-11-04 ENCOUNTER — Telehealth: Payer: Self-pay | Admitting: *Deleted

## 2014-11-04 ENCOUNTER — Other Ambulatory Visit: Payer: Self-pay | Admitting: *Deleted

## 2014-11-04 DIAGNOSIS — R300 Dysuria: Secondary | ICD-10-CM

## 2014-11-04 MED ORDER — PREDNISONE 5 MG PO TABS: ORAL_TABLET | ORAL | Status: DC

## 2014-11-04 NOTE — Telephone Encounter (Signed)
Patient called c/o pain in his groin area. At night he is having problems urinating and the pain level is a nine. He said the ibuprofen gives him some relief but not much. He stated that at the last office visit Dr. Megan Salon had discussed a "round of prednisone" if no improvement. He said the pain is very severe and he doesn't know if he should call the urologist or PCP but he needs some pain relief. Please advise

## 2014-11-04 NOTE — Telephone Encounter (Signed)
Please let him know that how start him on prednisone 20 mg daily. He has an appointment with me in 2 days.

## 2014-11-04 NOTE — Telephone Encounter (Signed)
Patient notified, thank you. Charles Wright

## 2014-11-04 NOTE — Telephone Encounter (Deleted)
Quantity please. I can than send to [facility] on [address]. Myrtis Hopping

## 2014-11-06 ENCOUNTER — Encounter: Payer: Self-pay | Admitting: Internal Medicine

## 2014-11-06 ENCOUNTER — Ambulatory Visit (INDEPENDENT_AMBULATORY_CARE_PROVIDER_SITE_OTHER): Payer: Medicare Other | Admitting: Internal Medicine

## 2014-11-06 ENCOUNTER — Other Ambulatory Visit: Payer: Self-pay | Admitting: Urology

## 2014-11-06 ENCOUNTER — Encounter (HOSPITAL_BASED_OUTPATIENT_CLINIC_OR_DEPARTMENT_OTHER): Payer: Self-pay | Admitting: *Deleted

## 2014-11-06 VITALS — BP 119/75 | HR 74 | Temp 97.7°F | Wt 207.0 lb

## 2014-11-06 DIAGNOSIS — R300 Dysuria: Secondary | ICD-10-CM

## 2014-11-06 NOTE — Assessment & Plan Note (Signed)
I still suspect that his urinary frequency, hesitancy and dysuria are related to an over exuberant inflammatory reaction to BCG. However, he has not had any improvement and actually feels a little worse since starting to drug therapy with rifampin and ethambutol. His options for treating BCG are limited by the fact that he had a moderately severe adverse reaction when he took [place] for 6 days recently. Also fluoroquinolone therapy is contraindicated because of his flecainide. The combination could lead to prolonged QT syndrome and arrhythmia. I will continue rifampin and ethambutol along with prednisone for now. I discussed the situation with Dr. Junious Silk. He will arrange repeat cystoscopy for biopsy of the bladder lesions. He will also consider fulguration of some of the lesions and bladder installation of Marcaine. Mr. Nance will follow up with me in 3 weeks.

## 2014-11-06 NOTE — Progress Notes (Signed)
NPO AFTER MN.  ARRIVE AT 0600. NEEDS ISTAT.  CURRENT EKG IN CHART AND EPIC.  WILL TAKE AM MEDS DOS W/ SIPS OF WATER.

## 2014-11-06 NOTE — Progress Notes (Signed)
Patient ID: Charles Wright, male   DOB: January 24, 1936, 79 y.o.   MRN: [id number]         [facility] for Infectious Disease  Patient Active Problem List   Diagnosis Date Noted  . Dysuria 10/29/2014    Priority: High  . Urinary frequency 10/29/2014    Priority: High  . Bladder cancer     Priority: High  . Urethral stricture 10/28/2014  . HTN (hypertension) 10/28/2014  . Paroxysmal atrial fibrillation 08/13/2014  . Long-term (current) use of anticoagulants 08/13/2014  . Obesity (BMI 30-39.9) 08/13/2014  . Hypertensive heart disease   . Sleep apnea   . Prostate cancer   . Lumbar stenosis with neurogenic claudication 12/03/2013  . Upper airway cough syndrome 01/27/2013    Patient's Medications  New Prescriptions   No medications on file  Previous Medications   ALLOPURINOL (ZYLOPRIM) 300 MG TABLET    Take 300 mg by mouth daily.    CHOLECALCIFEROL (VITAMIN D-3) 1000 UNITS CAPS    Take 1,000 Units by mouth 2 (two) times daily.    EPINEPHRINE 0.3 MG/0.3 ML IJ SOAJ INJECTION    Inject 0.3 mLs (0.3 mg total) into the muscle once.   ETHAMBUTOL (MYAMBUTOL) 400 MG TABLET    Take 3 tablets (1,200 mg total) by mouth daily.   FLECAINIDE (TAMBOCOR) 50 MG TABLET    Take 50 mg by mouth 2 (two) times daily.   GLUCOSAMINE SULFATE (DONA PO)    Take 1 tablet by mouth daily.    IBUPROFEN (ADVIL,MOTRIN) 200 MG TABLET    Take 200-800 mg by mouth every 6 (six) hours as needed for mild pain.   METHEN-HYOSC-METH BLUE-NA PHOS (UROGESIC-BLUE PO)    Take by mouth.   METOPROLOL (LOPRESSOR) 50 MG TABLET    Take 50 mg by mouth 2 (two) times daily.   MULTIPLE VITAMIN (MULTIVITAMIN) TABLET    Take 1 tablet by mouth daily.    PREDNISONE (DELTASONE) 20 MG TABLET    Take 20 mg by mouth daily with breakfast.   PREDNISONE (DELTASONE) 5 MG TABLET    20 mg daily for one week then 15 mg daily for one week then 10 mg daily for one week then 5 mg daily   RIFAMPIN (RIFADIN) 300 MG CAPSULE    Take 2 capsules (600 mg total)  by mouth daily.   SILODOSIN (RAPAFLO) 8 MG CAPS CAPSULE    Take 8 mg by mouth daily with breakfast.   VALSARTAN (DIOVAN) 160 MG TABLET    Take 160 mg by mouth every morning.   Modified Medications   No medications on file  Discontinued Medications   No medications on file    Subjective: Charles Wright is in for his routine follow-up visit. He has now been on rifampin and ethambutol for 9 days. He has tolerated the antibiotics well but he has not noted any improvement in his urinary frequency, hesitancy or dysuria. He called 2 days ago stating that he was miserable and I decided to start him on prednisone. He is still suffering greatly and getting up about every 15-30 minutes each night. He has had mild night sweats the past 2 nights. He has not taken his temperature but does not feel like he has had any fever. He has had some mild constipation and took a laxity of several days ago with relief.  Review of Systems: Pertinent items are noted in HPI.  Past Medical History  Diagnosis Date  . Hypertension   .  Hyperlipidemia   . Gilbert's syndrome   . Wears glasses   . OSA (obstructive sleep apnea)     2011 -- POSITIVE STUDY W/ CPAP RX BUT NON-COMPLIANT  . Chronic kidney disease     stones  "they come and go"  . GERD (gastroesophageal reflux disease)     takes  OTC periodically  . Hearing loss of both ears     bilateral hearing aids  . Paroxysmal a-fib   . Bladder cancer     prostate cancer    Social History  Substance Use Topics  . Smoking status: Never Smoker   . Smokeless tobacco: Never Used  . Alcohol Use: 4.2 oz/week    7 Glasses of wine per week     Comment: red wine glass daily    Family History  Problem Relation Age of Onset  . Leukemia Mother   . Heart attack Father   . Cancer Sister     Allergies  Allergen Reactions  . Bee Venom Anaphylaxis    Actually Hornets and Wasps.   . Losartan Other (See Comments)    Cough   . Oxycodone Other (See Comments)    ALTERED  MENTAL STATIS    Objective: Filed Vitals:   11/06/14 1002  BP: 119/75  Pulse: 74  Temp: 97.7 F (36.5 C)  TempSrc: Oral  Weight: 207 lb (93.895 kg)   Body mass index is 32.41 kg/(m^2).  General: He appears mildly frustrated and uncomfortable but otherwise in no distress Skin: No rash Lungs: Clear Cor: Regular S1 and S2 with no murmurs Abdomen: Soft and nontender. No suprapubic or CVA tenderness   Lab Results Urine AFB stain negative; AFB culture is pending   Problem List Items Addressed This Visit      High   Dysuria - Primary    I still suspect that his urinary frequency, hesitancy and dysuria are related to an over exuberant inflammatory reaction to BCG. However, he has not had any improvement and actually feels a little worse since starting to drug therapy with rifampin and ethambutol. His options for treating BCG are limited by the fact that he had a moderately severe adverse reaction when he took [place] for 6 days recently. Also fluoroquinolone therapy is contraindicated because of his flecainide. The combination could lead to prolonged QT syndrome and arrhythmia. I will continue rifampin and ethambutol along with prednisone for now. I discussed the situation with Dr. Junious Silk. He will arrange repeat cystoscopy for biopsy of the bladder lesions. He will also consider fulguration of some of the lesions and bladder installation of Marcaine. Charles Wright will follow up with me in 3 weeks.          Michel Bickers, MD [facility] [place] [facility] [phone] pager   [phone] cell 11/06/2014, 10:27 AM

## 2014-11-07 NOTE — H&P (Signed)
History of Present Illness     F/u- PCP DR. Reade    1- bladder cancer - June 2016 - CIS (posterior erythema), dome benign; Sept 2013 HGTa right bladder, left UO, Jul 2015 CIS right bladder  Last upper tract: Jun 2016 - RGP's; Jul 2015 RGP's; Mar 2015 CT Hematuria Last BCG: Aug 2016 completed BCG 3 / 6 - restart on hold due to LUTS.  Last cystoscopy: Sep 2016 - areas of nodular edema and erythema Last cytology: Jan 2016 negative    2-Prostate cancer/BPH/prostate nodule -July 2013 PSA 3.06 -Sep 2013 normal DRE under anesthesia -May 2014 PSA 3.8, nodule right prostate -Jul 2014 DRE - right apical nodule (visible on U/S - hyperechoic, shadowing) TRUS postate bx (apical nodule directly samples in right apical bx) - core looked different - dark, stone-like material  42 gram prostate right apical nodule on DRE Right base atypical small acinar proliferation Right mid 3+3=6, 15%  Right apex - benign with inflammation -Mar 2015 PSA 2.86 -Jul 2015 - Exam under anesthesia - normal DRE, benign prostate -Jan 2016 PSA 2.69  -Jun 2016 - nl DRE/EUA, PSA 2.96   3-left ureteral stricture -  -May 2010 - atypical cytology, CT scan negative for hydronephrosis but a little dilated distal ureter was noted. Cystoscopy, bilateral RGP - finding of a LEFT ureteral stricture about 2-3 cm just below the pelvic brim on the left side. There was no hydronephrosis. URS not successful. He was stented. -Apr 2010 repeat left URS - dilated, but still scope would not pass proximal to stricture. Brush biopsy of this area was negative. Larger stent was placed and removed.  -July 2010 IVP - normal and showed no hydronephrosis or abnormality of the left distal ureter.  -Jan 2011 CT scan normal, no hydronephrosis, and the hyperechoic area of the left kidney was a benign column of Bertin. Cytology was negative July 2010.  -Sept 2013 left distal ureteral stricture seen on URS, but no mass (URS done at time of bladder Ca  removal).  -August 08, 2013 patient had left flank pain nausea and vomiting. White count 11.4, BUN 18, creatinine 1.1, UA - 40 red blood cells, 58 white blood cells, no bacteria. Afebrile. Treated with Cipro. CT scan showed stranding around the left peripelvic fat and left ureter with mild ureteral dilation. There were no stones.   4-LUTS - Aug 2016 - started on Bactrim last month for urinary frequency/urgency and dysuria from clinic in [place]. Urine Cx repeat here was negative. His PVR was 008 ml. He developed symptoms after the third BCG. He continues to have frequency, urgency and dysuria. Uribel did not help. UA few bacteria, > 60 wbc, 3-10 rbc. urine Cx negative. Tx with Cipro.    September 2016 interval history Pertinent for continued management of dysuria frequency and urgency. I scoped him last week and he had a urethral stricture but had areas of erythema and edema in the bladder consistent with cystitis. Isoniazid has not helped. He's having pain in his feet and ankles.    Past Medical History Problems  1. History of Abdominal pain, LLQ (left lower quadrant) (R10.32) 2. History of kidney stones (Z87.442) 3. History of Microscopic hematuria (R31.2) 4. History of Nephrolithiasis Of The Left Kidney 5. History of Ureteral stricture (N13.5)  Surgical History Problems  1. History of Back Surgery 2. History of Biopsy Of The Prostate Needle 3. History of Cystoscopy With Biopsy 4. History of Cystoscopy With Fulguration Minor Lesion (Under 23mm) 5. History of  Cystoscopy With Fulguration Minor Lesion (Under 80mm) 6. History of Cystoscopy With Fulguration Minor Lesion (Under 74mm) 7. History of Cystoscopy With Fulguration Small Lesion (5-98mm) 8. History of Cystoscopy With Insertion Of Ureteral Stent Left 9. History of Cystoscopy With Insertion Of Ureteral Stent Left 10. History of Cystoscopy With Insertion Of Ureteral Stent Left 11. History of Cystoscopy With Ureteral Cath And Brush Biopsy  Left 12. History of Cystoscopy With Ureteroscopy For Biopsy Left 13. History of Cystoscopy With Ureteroscopy Left 14. History of Cystourethroscopy With Treatment Of Ureteral Stricture 15. History of Leg Repair 16. History of Oral Surgery  Current Meds 1. BCG Instillation; BCG half dose today; To Be Done: [id number]; Status: HOLD FOR -  Administration Ordered 2. BCG Instillation; BCG today full dose; To Be Done: [id number]; Status: HOLD FOR -  Administration Ordered 3. [place] TABS;  Therapy: (Recorded:27May2016) to Recorded 4. EpiPen 0.3 MG/0.3ML DEVI;  Therapy: [phone] to Recorded 5. Glucosamine CAPS;  Therapy: (CHYIFOYD:[id number]) to Recorded 6. Ibuprofen 800 MG Oral Tablet;  Therapy: 27Apr2012 to Recorded 7. Isoniazid 300 MG Oral Tablet; TAKE 1 TABLET DAILY AS DIRECTED;  Therapy: [id number] to (Evaluate:18Sep2016)  Requested for: 09Sep2016; Last  Rx:09Sep2016 Ordered 8. Metoprolol Tartrate TABS;  Therapy: (Recorded:27May2016) to Recorded 9. Multi-Vitamin/Iron TABS;  Therapy: (GGEZMOQH:[id number]) to Recorded 10. TraMADol HCl - 50 MG Oral Tablet;   Therapy: (Recorded:22Aug2016) to Recorded 11. Vitamin D TABS;   Therapy: (Recorded:21Jul2014) to Recorded  Allergies Medication  1. OxyCODONE HCl TABS Non-Medication  2. Bee sting  Family History Problems  1. Family history of Death In The Family Father   Deceased at age 34 2. Family history of Death In The Family Mother   Deceased at age 84 3. No pertinent family history : Sibling  Social History Problems  1. Alcohol Use   2 daily 2. Caffeine Use   2 daily 3. Marital History - Currently Married 4. 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 Denied: History of Tobacco Use  Results/Data Urine [Data Includes: Last 1 Day]   [id number]  COLOR YELLOW   APPEARANCE CLEAR   SPECIFIC GRAVITY 1.015   pH 6.0   GLUCOSE NEGATIVE   BILIRUBIN NEGATIVE   KETONE NEGATIVE   BLOOD NEGATIVE   PROTEIN NEGATIVE    NITRITE NEGATIVE   LEUKOCYTE ESTERASE 1+   SQUAMOUS EPITHELIAL/HPF 0-5 HPF  WBC 10-20 WBC/HPF  RBC 0-2 RBC/HPF  BACTERIA NONE SEEN HPF  CRYSTALS NONE SEEN HPF  CASTS NONE SEEN LPF  Yeast NONE SEEN HPF   Assessment Assessed  1. 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 Infectious Disease/Immunology Referral Referral  Referral - I spoke with Dr. Megan Salon,  [facility], who will get patient in Monday AM. Please send office notes. Thanks.   Status: Hold For - Appointment,Records  Requested for: 16Sep2016 Health Maintenance  2. UA With REFLEX; [Do Not Release]; Status:Resulted - Requires Verification;   Done:  [id number] 02:33PM Malignant neoplasm of overlapping sites of bladder  3. Follow-up Office  Follow-up  Status: Hold For - Date of Service  Requested for:  18Oct2016  Discussion/Summary      Dysuria, frequency, urgency-likely BCG cystitis. Continue INH. Refer to ID clinic. I discussed with Dr. Megan Salon. He recommended to hold INH over the weekend. Continue acetaminophen and NSAIDs. I confirmed with the patient today he has not had fevers or chills. I gave patient some samples of Rap[place]flo and Urogesic. He will keep follow-up with me in 1 month.  Signatures Electronically signed by : [name], M.D.; Oct 25 2014  3:35PM EST   Addendum: Patient continues to have severe dysuria, frequency and urgency.  His cultures have been negative.AFB culture preliminary negative.  I discussed with Dr. Megan Salon.  I brought the patient back for cystoscopy, exam under anesthesia, bladder biopsy and fulguration as well as instillation of Marcaine and Pyridium and of establishing the diagnosis, ensuring no malignancy and hopefully providing some relief to the patient with the Pyridium Marcaine instillation.

## 2014-11-08 ENCOUNTER — Ambulatory Visit (HOSPITAL_BASED_OUTPATIENT_CLINIC_OR_DEPARTMENT_OTHER): Payer: Medicare Other | Admitting: Anesthesiology

## 2014-11-08 ENCOUNTER — Encounter (HOSPITAL_BASED_OUTPATIENT_CLINIC_OR_DEPARTMENT_OTHER): Payer: Self-pay | Admitting: *Deleted

## 2014-11-08 ENCOUNTER — Ambulatory Visit (HOSPITAL_BASED_OUTPATIENT_CLINIC_OR_DEPARTMENT_OTHER)
Admission: RE | Admit: 2014-11-08 | Discharge: 2014-11-08 | Disposition: A | Discharge status: Home / Self Care | Payer: Medicare Other | Source: Ambulatory Visit | Attending: Urology | Admitting: Urology

## 2014-11-08 ENCOUNTER — Encounter (HOSPITAL_BASED_OUTPATIENT_CLINIC_OR_DEPARTMENT_OTHER)
Admission: RE | Disposition: A | Discharge status: Home / Self Care | Payer: Self-pay | Source: Ambulatory Visit | Attending: Urology

## 2014-11-08 DIAGNOSIS — N3090 Cystitis, unspecified without hematuria: Secondary | ICD-10-CM | POA: Insufficient documentation

## 2014-11-08 DIAGNOSIS — E669 Obesity, unspecified: Secondary | ICD-10-CM | POA: Diagnosis not present

## 2014-11-08 DIAGNOSIS — Z79899 Other long term (current) drug therapy: Secondary | ICD-10-CM | POA: Insufficient documentation

## 2014-11-08 DIAGNOSIS — Z87442 Personal history of urinary calculi: Secondary | ICD-10-CM | POA: Insufficient documentation

## 2014-11-08 DIAGNOSIS — N359 Urethral stricture, unspecified: Secondary | ICD-10-CM | POA: Diagnosis not present

## 2014-11-08 DIAGNOSIS — N3289 Other specified disorders of bladder: Secondary | ICD-10-CM | POA: Insufficient documentation

## 2014-11-08 DIAGNOSIS — Z791 Long term (current) use of non-steroidal anti-inflammatories (NSAID): Secondary | ICD-10-CM | POA: Insufficient documentation

## 2014-11-08 DIAGNOSIS — R3915 Urgency of urination: Secondary | ICD-10-CM | POA: Insufficient documentation

## 2014-11-08 DIAGNOSIS — G4730 Sleep apnea, unspecified: Secondary | ICD-10-CM | POA: Insufficient documentation

## 2014-11-08 DIAGNOSIS — I4891 Unspecified atrial fibrillation: Secondary | ICD-10-CM | POA: Insufficient documentation

## 2014-11-08 DIAGNOSIS — K219 Gastro-esophageal reflux disease without esophagitis: Secondary | ICD-10-CM | POA: Insufficient documentation

## 2014-11-08 DIAGNOSIS — N3020 Other chronic cystitis without hematuria: Secondary | ICD-10-CM | POA: Diagnosis not present

## 2014-11-08 DIAGNOSIS — R300 Dysuria: Secondary | ICD-10-CM | POA: Diagnosis present

## 2014-11-08 DIAGNOSIS — R350 Frequency of micturition: Secondary | ICD-10-CM | POA: Diagnosis not present

## 2014-11-08 DIAGNOSIS — I10 Essential (primary) hypertension: Secondary | ICD-10-CM | POA: Insufficient documentation

## 2014-11-08 DIAGNOSIS — N401 Enlarged prostate with lower urinary tract symptoms: Secondary | ICD-10-CM | POA: Insufficient documentation

## 2014-11-08 DIAGNOSIS — C61 Malignant neoplasm of prostate: Secondary | ICD-10-CM | POA: Diagnosis not present

## 2014-11-08 HISTORY — PX: CYSTOSCOPY WITH BIOPSY: SHX5122

## 2014-11-08 HISTORY — DX: Personal history of urinary calculi: Z87.442

## 2014-11-08 HISTORY — DX: Malignant neoplasm of prostate: C61

## 2014-11-08 LAB — POCT I-STAT 4, (NA,K, GLUC, HGB,HCT)
GLUCOSE: 114 mg/dL — AB (ref 65–99)
HEMATOCRIT: 48 % (ref 39.0–52.0)
HEMOGLOBIN: 16.3 g/dL (ref 13.0–17.0)
POTASSIUM: 4.4 mmol/L (ref 3.5–5.1)
SODIUM: 135 mmol/L (ref 135–145)

## 2014-11-08 SURGERY — CYSTOSCOPY, WITH BIOPSY
Anesthesia: General | Site: Bladder

## 2014-11-08 MED ORDER — FENTANYL CITRATE (PF) 100 MCG/2ML IJ SOLN
INTRAMUSCULAR | Status: AC
  Filled 2014-11-08: qty 2

## 2014-11-08 MED ORDER — PHENAZOPYRIDINE HCL 200 MG PO TABS
ORAL_TABLET | ORAL | Status: DC | PRN
  Administered 2014-11-08: 08:00:00 via INTRAVESICAL

## 2014-11-08 MED ORDER — LIDOCAINE HCL (CARDIAC) 20 MG/ML IV SOLN
INTRAVENOUS | Status: DC | PRN
  Administered 2014-11-08: 70 mg via INTRAVENOUS

## 2014-11-08 MED ORDER — MEPERIDINE HCL 25 MG/ML IJ SOLN
6.2500 mg | INTRAMUSCULAR | Status: DC | PRN
  Filled 2014-11-08: qty 1

## 2014-11-08 MED ORDER — LACTATED RINGERS IV SOLN
INTRAVENOUS | Status: DC
  Administered 2014-11-08: 07:00:00 via INTRAVENOUS
  Filled 2014-11-08: qty 1000

## 2014-11-08 MED ORDER — FENTANYL CITRATE (PF) 100 MCG/2ML IJ SOLN
INTRAMUSCULAR | Status: DC | PRN
  Administered 2014-11-08: 25 ug via INTRAVENOUS
  Administered 2014-11-08: 50 ug via INTRAVENOUS
  Administered 2014-11-08: 25 ug via INTRAVENOUS

## 2014-11-08 MED ORDER — CEFAZOLIN SODIUM-DEXTROSE 2-3 GM-% IV SOLR
INTRAVENOUS | Status: AC
  Filled 2014-11-08: qty 50

## 2014-11-08 MED ORDER — STERILE WATER FOR IRRIGATION IR SOLN
Status: DC | PRN
  Administered 2014-11-08: 3000 mL

## 2014-11-08 MED ORDER — MIDAZOLAM HCL 5 MG/5ML IJ SOLN
INTRAMUSCULAR | Status: DC | PRN
  Administered 2014-11-08: 2 mg via INTRAVENOUS

## 2014-11-08 MED ORDER — HYDROXYZINE HCL 25 MG PO TABS: 25.0000 mg | ORAL_TABLET | Freq: Every day | ORAL | Status: DC

## 2014-11-08 MED ORDER — CEFAZOLIN SODIUM 1-5 GM-% IV SOLN
1.0000 g | INTRAVENOUS | Status: DC
Start: 2014-11-08 — End: 2014-11-08
  Filled 2014-11-08: qty 50

## 2014-11-08 MED ORDER — MIDAZOLAM HCL 2 MG/2ML IJ SOLN
INTRAMUSCULAR | Status: AC
  Filled 2014-11-08: qty 2

## 2014-11-08 MED ORDER — PROMETHAZINE HCL 25 MG/ML IJ SOLN
6.2500 mg | INTRAMUSCULAR | Status: DC | PRN
  Filled 2014-11-08: qty 1

## 2014-11-08 MED ORDER — CIPROFLOXACIN HCL 500 MG PO TABS: 500.0000 mg | ORAL_TABLET | Freq: Two times a day (BID) | ORAL | Status: DC

## 2014-11-08 MED ORDER — PROPOFOL 10 MG/ML IV BOLUS
INTRAVENOUS | Status: DC | PRN
  Administered 2014-11-08: 180 mg via INTRAVENOUS

## 2014-11-08 MED ORDER — MELOXICAM 7.5 MG PO TABS
7.5000 mg | ORAL_TABLET | Freq: Every day | ORAL | Status: DC
Start: 2014-11-08 — End: 2014-11-25

## 2014-11-08 MED ORDER — FENTANYL CITRATE (PF) 100 MCG/2ML IJ SOLN
25.0000 ug | INTRAMUSCULAR | Status: DC | PRN
  Filled 2014-11-08: qty 1

## 2014-11-08 MED ORDER — CEFAZOLIN SODIUM-DEXTROSE 2-3 GM-% IV SOLR
2.0000 g | INTRAVENOUS | Status: AC
  Administered 2014-11-08: 2 g via INTRAVENOUS
  Filled 2014-11-08: qty 50

## 2014-11-08 SURGICAL SUPPLY — 17 items
BAG DRAIN URO-CYSTO SKYTR STRL (DRAIN) ×2 IMPLANT
CATH ROBINSON RED A/P 16FR (CATHETERS) ×2 IMPLANT
CLOTH BEACON ORANGE TIMEOUT ST (SAFETY) ×2 IMPLANT
ELECT REM PT RETURN 9FT ADLT (ELECTROSURGICAL) ×2
ELECTRODE REM PT RTRN 9FT ADLT (ELECTROSURGICAL) ×1 IMPLANT
GLOVE BIO SURGEON STRL SZ 6.5 (GLOVE) ×2 IMPLANT
GLOVE BIO SURGEON STRL SZ7.5 (GLOVE) ×4 IMPLANT
GLOVE INDICATOR 6.5 STRL GRN (GLOVE) ×2 IMPLANT
GOWN STRL REUS W/ TWL LRG LVL3 (GOWN DISPOSABLE) ×1 IMPLANT
GOWN STRL REUS W/ TWL XL LVL3 (GOWN DISPOSABLE) ×1 IMPLANT
GOWN STRL REUS W/TWL LRG LVL3 (GOWN DISPOSABLE) ×2
GOWN STRL REUS W/TWL XL LVL3 (GOWN DISPOSABLE) ×2
MANIFOLD NEPTUNE II (INSTRUMENTS) ×2 IMPLANT
NEEDLE HYPO 22GX1.5 SAFETY (NEEDLE) IMPLANT
NS IRRIG 500ML POUR BTL (IV SOLUTION) IMPLANT
PACK CYSTO (CUSTOM PROCEDURE TRAY) ×2 IMPLANT
WATER STERILE IRR 3000ML UROMA (IV SOLUTION) ×2 IMPLANT

## 2014-11-08 NOTE — Interval H&P Note (Signed)
History and Physical Interval Note:  11/08/2014 7:36 AM  Charles Wright  has presented today for surgery, with the diagnosis of BLADDER ERYTHEMA  The various methods of treatment have been discussed with the patient and family. After consideration of risks, benefits and other options for treatment, the patient has consented to  Procedure(s): CYSTOSCOPY/BIOPSPY BLADDER INSTILLATION OF MARCAINE AND PYRIDIUM (N/A) as a surgical intervention .  The patient's history has been reviewed, patient examined, no change in status, stable for surgery.  I have reviewed the patient's chart and labs. Pt has no dysuria or fever. He is OK when he is "standing up" but is "miserable" when he sits or is laying down. He feels like his prostate is "bound up". Discussed EUA with above procedures. Also will start hydroxyzine tonight and meloxicam tonight. Questions were answered to the patient's satisfaction.     ESKRIDGE, MATTHEW

## 2014-11-08 NOTE — Op Note (Signed)
Preoperative diagnosis: Bladder erythema/cystitis, pelvic pain, frequency, urgency Postoperative diagnosis: Same  Procedure: Exam under anesthesia, cystoscopy, bladder biopsy and fulguration, Installation of Marcaine/Pyridium  Surgeon: Junious Silk  Anesthesia: Gen.  Indication for procedure: Patient is a 79 year old male with urothelial carcinoma and recurrent CIS. BCG was reintroduced about 2 months ago and after his third installation knee developed severe bladder pain frequency and urgency. This has continued despite anti-TB treatment and other therapies. He has not tolerated cystoscopy in the office or instillations well. Therefore is brought today for exam under anesthesia, cystoscopy biopsy and installation of Marcaine/Pyridium in order to ensure proper diagnosis and hopefully provide some relief.  Findings: On exam under anesthesia the penis was normal without mass or lesion. Testicles were descended bilaterally and palpably normal. On bimanual and digital rectal exam, There were no palpable pelvic masses. The prostate was small and palpably normal. There were no hard areas or nodules. The right SV was palpable, the left SV was not palpable.   On cystoscopy the urethra was normal. The prostatic urethra was normal. There was no visual obstruction. In the bladder the trigone and ureteral orifices were in their normal orthotopic position with clear efflux. There was beefy red edematous and erythematous mucosa primarily right posterior and left posterior circling up toward the posterior dome and right dome.  Description of procedure: After consent was obtained patient brought to the operating room. After adequate anesthesia he is placed in lithotomy position and prepped and draped in the usual sterile fashion. A timeout was performed to confirm the patient and procedure. An exam under anesthesia was performed. The cystoscope was passed per urethra and the bladder carefully inspected with the 30 and  70 lens. The erythematous areas were biopsied right posterior, left posterior, dome and fulgurated. Hemostasis was insured at low-pressure. The scope was removed and a 16 French red rubber catheter placed. Marcaine Pyridium instilled. The patient was awakened and taken to the recovery room in stable condition.  Complications: None Blood loss: Minimal Drains: None  Specimens: Bladder biopsies to pathology-right, left, dome.

## 2014-11-08 NOTE — Discharge Instructions (Signed)
Cystoscopy, Care After °Refer to this sheet in the next few weeks. These instructions provide you with information on caring for yourself after your procedure. Your caregiver may also give you more specific instructions. Your treatment has been planned according to current medical practices, but problems sometimes occur. Call your caregiver if you have any problems or questions after your procedure. °HOME CARE INSTRUCTIONS  °Things you can do to ease any discomfort after your procedure include: °· Drinking enough water and fluids to keep your urine clear or pale yellow. °· Taking a warm bath to relieve any burning feelings. °SEEK IMMEDIATE MEDICAL CARE IF:  °· You have an increase in blood in your urine. °· You notice blood clots in your urine. °· You have difficulty passing urine. °· You have the chills. °· You have abdominal pain. °· You have a fever or persistent symptoms for more than 2-3 days. °· You have a fever and your symptoms suddenly get worse. °MAKE SURE YOU:  °· Understand these instructions. °· Will watch your condition. °· Will get help right away if you are not doing well or get worse. °Document Released: 08/14/2004 Document Revised: 09/27/2012 Document Reviewed: 07/19/2011 °[employer]® Patient Information ©2015 [employer]. This information is not intended to replace advice given to you by your health care provider. Make sure you discuss any questions you have with your health care provider. ° °Post Anesthesia Home Care Instructions ° °Activity: °Get plenty of rest for the remainder of the day. A responsible adult should stay with you for 24 hours following the procedure.  °For the next 24 hours, DO NOT: °-Drive a car °-Operate machinery °-Drink alcoholic beverages °-Take any medication unless instructed by your physician °-Make any legal decisions or sign important papers. ° °Meals: °Start with liquid foods such as gelatin or soup. Progress to regular foods as tolerated. Avoid greasy, spicy, heavy  foods. If nausea and/or vomiting occur, drink only clear liquids until the nausea and/or vomiting subsides. Call your physician if vomiting continues. ° °Special Instructions/Symptoms: °Your throat may feel dry or sore from the anesthesia or the breathing tube placed in your throat during surgery. If this causes discomfort, gargle with warm salt water. The discomfort should disappear within 24 hours. ° °If you had a scopolamine patch placed behind your ear for the management of post- operative nausea and/or vomiting: ° °1. The medication in the patch is effective for 72 hours, after which it should be removed.  Wrap patch in a tissue and discard in the trash. Wash hands thoroughly with soap and water. °2. You may remove the patch earlier than 72 hours if you experience unpleasant side effects which may include dry mouth, dizziness or visual disturbances. °3. Avoid touching the patch. Wash your hands with soap and water after contact with the patch. °  ° °

## 2014-11-08 NOTE — Anesthesia Postprocedure Evaluation (Signed)
  Anesthesia Post-op Note  Patient: Charles Wright  Procedure(s) Performed: Procedure(s) (LRB): CYSTOSCOPY/BIOPSPY BLADDER INSTILLATION OF MARCAINE AND PYRIDIUM (N/A)  Patient Location: PACU  Anesthesia Type: General  Level of Consciousness: awake and alert   Airway and Oxygen Therapy: Patient Spontanous Breathing  Post-op Pain: mild  Post-op Assessment: Post-op Vital signs reviewed, Patient's Cardiovascular Status Stable, Respiratory Function Stable, Patent Airway and No signs of Nausea or vomiting  Last Vitals:  Filed Vitals:   11/08/14 0949  BP: 106/59  Pulse: 55  Temp: 35.8 C  Resp: 16    Post-op Vital Signs: stable   Complications: No apparent anesthesia complications

## 2014-11-08 NOTE — Anesthesia Preprocedure Evaluation (Addendum)
Anesthesia Evaluation  Patient identified by MRN, date of birth, ID band Patient awake    Reviewed: Allergy & Precautions, NPO status , Patient's Chart, lab work & pertinent test results  History of Anesthesia Complications Negative for: history of anesthetic complications  Airway Mallampati: II  TM Distance: >3 FB Neck ROM: Full    Dental no notable dental hx. (+) Dental Advisory Given, Teeth Intact, Caps,    Pulmonary sleep apnea ,    Pulmonary exam normal breath sounds clear to auscultation       Cardiovascular Exercise Tolerance: Good hypertension, Pt. on medications and Pt. on home beta blockers Normal cardiovascular exam+ dysrhythmias Atrial Fibrillation  Rhythm:Regular Rate:Normal     Neuro/Psych Anxiety negative neurological ROS  negative psych ROS   GI/Hepatic Neg liver ROS, GERD  Medicated and Controlled,  Endo/Other  obesity  Renal/GU Bladder lesions, BPH  negative genitourinary   Musculoskeletal negative musculoskeletal ROS (+)   Abdominal   Peds negative pediatric ROS (+)  Hematology negative hematology ROS (+)   Anesthesia Other Findings   Reproductive/Obstetrics negative OB ROS                          Anesthesia Physical  Anesthesia Plan  ASA: III  Anesthesia Plan: General   Post-op Pain Management:    Induction: Intravenous  Airway Management Planned: LMA  Additional Equipment:   Intra-op Plan:   Post-operative Plan: Extubation in OR  Informed Consent: I have reviewed the patients History and Physical, chart, labs and discussed the procedure including the risks, benefits and alternatives for the proposed anesthesia with the patient or authorized representative who has indicated his/her understanding and acceptance.   Dental advisory given  Plan Discussed with: CRNA  Anesthesia Plan Comments:         Anesthesia Quick Evaluation

## 2014-11-08 NOTE — Progress Notes (Signed)
Reported to Dr. Junious Silk via phone call that patient concerned about pain. Dr. Junious Silk to call in pain medication to pharmacy.

## 2014-11-08 NOTE — Anesthesia Procedure Notes (Signed)
Procedure Name: LMA Insertion Date/Time: 11/08/2014 7:45 AM Performed by: Wanita Chamberlain Pre-anesthesia Checklist: Patient identified, Timeout performed, Emergency Drugs available, Suction available and Patient being monitored Patient Re-evaluated:Patient Re-evaluated prior to inductionOxygen Delivery Method: Circle system utilized Preoxygenation: Pre-oxygenation with 100% oxygen Intubation Type: IV induction LMA: LMA inserted LMA Size: 4.0 Number of attempts: 1 Airway Equipment and Method: Bite block Placement Confirmation: positive ETCO2 and breath sounds checked- equal and bilateral Tube secured with: Tape Dental Injury: Teeth and Oropharynx as per pre-operative assessment

## 2014-11-08 NOTE — Transfer of Care (Signed)
Immediate Anesthesia Transfer of Care Note  Patient: Charles Wright  Procedure(s) Performed: Procedure(s): CYSTOSCOPY/BIOPSPY BLADDER INSTILLATION OF MARCAINE AND PYRIDIUM (N/A)  Patient Location: PACU  Anesthesia Type:General  Level of Consciousness: awake, alert , oriented and patient cooperative  Airway & Oxygen Therapy: Patient Spontanous Breathing and Patient connected to nasal cannula oxygen  Post-op Assessment: Report given to RN and Post -op Vital signs reviewed and stable  Post vital signs: Reviewed and stable  Last Vitals:  Filed Vitals:   11/08/14 0611  BP: 111/71  Pulse: 70  Temp: 36.6 C  Resp: 16    Complications: No apparent anesthesia complications

## 2014-11-11 ENCOUNTER — Encounter (HOSPITAL_BASED_OUTPATIENT_CLINIC_OR_DEPARTMENT_OTHER): Payer: Self-pay | Admitting: Urology

## 2014-11-12 NOTE — Addendum Note (Signed)
Addendum  created 11/12/14 1210 by Montez Hageman, MD   Modules edited: Anesthesia Responsible Staff

## 2014-11-25 ENCOUNTER — Ambulatory Visit (INDEPENDENT_AMBULATORY_CARE_PROVIDER_SITE_OTHER): Payer: Medicare Other | Admitting: Internal Medicine

## 2014-11-25 ENCOUNTER — Encounter: Payer: Self-pay | Admitting: Internal Medicine

## 2014-11-25 VITALS — BP 102/68 | HR 60 | Temp 97.8°F | Ht 67.5 in | Wt 204.0 lb

## 2014-11-25 DIAGNOSIS — R300 Dysuria: Secondary | ICD-10-CM

## 2014-11-25 MED ORDER — PREDNISONE 5 MG PO TABS: ORAL_TABLET | ORAL | Status: AC

## 2014-11-25 NOTE — Assessment & Plan Note (Signed)
His lower urinary tract symptoms began in late spring after he received a BCG bladder instillation. They worsened after his last bladder instillation in late July and August. Although BCG may have been the initial trigger for his bladder inflammation, the pathology exam of his recent bladder biopsies and the negative AFB culture (not final) would suggest that his current lower urinary tract symptoms are not due to direct, active bladder wall infection with BCG. He will finish his prednisone taper in 4 days. He would like to continue ethambutol and rifampin at least until his AFB culture is final in another 3 weeks. He will continue on his current dose of hydroxyzine. He will follow-up with me in 3 weeks.

## 2014-11-25 NOTE — Progress Notes (Signed)
Patient ID: Charles Wright, male   DOB: [birth year], 79 y.o.   MRN: [id number]         [facility]  Patient Active Problem List   Diagnosis Date Noted  . Dysuria 10/29/2014    Priority: High  . Urinary frequency 10/29/2014    Priority: High  . Bladder cancer [facility])     Priority: High  . Urethral stricture 10/28/2014  . HTN (hypertension) 10/28/2014  . Paroxysmal atrial fibrillation ([place]) 08/13/2014  . Long-term (current) use of anticoagulants 08/13/2014  . Obesity (BMI 30-39.9) 08/13/2014  . Hypertensive heart disease   . Sleep apnea   . Prostate cancer ([place])   . Lumbar stenosis with neurogenic claudication 12/03/2013  . Upper airway cough syndrome 01/27/2013    Patient's Medications  New Prescriptions   No medications on file  Previous Medications   ALLOPURINOL (ZYLOPRIM) 300 MG TABLET    Take 300 mg by mouth every morning.    CHOLECALCIFEROL (VITAMIN D-3) 1000 UNITS CAPS    Take 1,000 Units by mouth 2 (two) times daily.    EPINEPHRINE 0.3 MG/0.3 ML IJ SOAJ INJECTION    Inject 0.3 mLs (0.3 mg total) into the muscle once.   ETHAMBUTOL (MYAMBUTOL) 400 MG TABLET    Take 3 tablets (1,200 mg total) by mouth daily.   FLECAINIDE (TAMBOCOR) 50 MG TABLET    Take 50 mg by mouth 2 (two) times daily.   GLUCOSAMINE SULFATE (DONA PO)    Take 1 tablet by mouth daily.    HYDROXYZINE (ATARAX/VISTARIL) 25 MG TABLET    Take 1 tablet (25 mg total) by mouth at bedtime.   METOPROLOL (LOPRESSOR) 50 MG TABLET    Take 50 mg by mouth 2 (two) times daily.   MULTIPLE VITAMIN (MULTIVITAMIN) TABLET    Take 1 tablet by mouth daily.    RIFAMPIN (RIFADIN) 300 MG CAPSULE    Take 2 capsules (600 mg total) by mouth daily.   VALSARTAN (DIOVAN) 160 MG TABLET    Take 160 mg by mouth every morning.   Modified Medications   Modified Medication Previous Medication   PREDNISONE (DELTASONE) 5 MG TABLET predniSONE (DELTASONE) 5 MG tablet      20 mg daily for one week then 15 mg daily for one  week then 10 mg daily for one week then 5 mg daily    20 mg daily for one week then 15 mg daily for one week then 10 mg daily for one week then 5 mg daily  Discontinued Medications   CIPROFLOXACIN (CIPRO) 500 MG TABLET    Take 1 tablet (500 mg total) by mouth 2 (two) times daily.   MELOXICAM (MOBIC) 7.5 MG TABLET    Take 1 tablet (7.5 mg total) by mouth daily.    Subjective: Charles Wright is in for his routine follow-up visit. He has now been on empiric ethambutol and rifampin for 28 days. Is also been on a tapering dose of prednisone for 21 days. He underwent cystoscopy on 11/08/2014 with multiple biopsies of inflamed bladder mucosa. The biopsy revealed "denuded, benign epithelium and submucosa with chronic inflammation, telangiectasias and reactive changes". AFB culture of urine remains negative at 3 weeks. He started on hydroxyzine around the time he started prednisone 3 weeks ago. After 5 days of 25 mg daily the dose was increased to 50 mg daily. Over the past 7-10 days he has noted significant improvement. He is not having as much dysuria, hesitancy and nocturia. He  is now able to sleep for several hours at a time before he has to get up and pass his urine. He has not had any fever, chills or sweats. He has not had any problems tolerating his ethambutol, rifampin or prednisone.  Review of Systems: Pertinent items are noted in HPI.  Past Medical History  Diagnosis Date  . Hypertension   . Hyperlipidemia   . Gilbert's syndrome   . Wears glasses   . GERD (gastroesophageal reflux disease)     takes  OTC periodically  . Paroxysmal a-fib [facility])     cardiologist-  dr Wynonia Lawman  . OSA (obstructive sleep apnea)     2011 -- POSITIVE STUDY W/ CPAP RX BUT NON-COMPLIANT  . History of kidney stones   . Wears hearing aid     bilateral  . Bladder cancer (H[facility])     BCG's tx's  . Prostate cancer [facility])     last PSA  2.9  (montiored by urologist  dr Junious Silk)  . Erythematous bladder mucosa   . Dysuria      Social History  Substance Use Topics  . Smoking status: Never Smoker   . Smokeless tobacco: Never Used  . Alcohol Use: 4.2 oz/week    7 Glasses of wine per week     Comment: red wine glass daily    Family History  Problem Relation Age of Onset  . Leukemia Mother   . Heart attack Father   . Cancer Sister     Allergies  Allergen Reactions  . Bee Venom Anaphylaxis    Actually Hornets and Wasps.   . Losartan Other (See Comments)    Cough   . Oxycodone Other (See Comments)    ALTERED MENTAL STATIS    Objective: Filed Vitals:   11/25/14 1338  BP: 102/68  Pulse: 60  Temp: 97.8 F (36.6 C)  TempSrc: Oral  Height: 5' 7.5" (1.715 m)  Weight: 204 lb (92.534 kg)   Body mass index is 31.46 kg/(m^2).  General: He is smiling and in much better spirits Skin: No rash Lungs: Clear Cor: Regular S1 and S2 with no murmurs Abdomen: Soft and nontender, specifically no suprapubic or CVA tenderness    Problem List Items Addressed This Visit      High   Dysuria - Primary    His lower urinary tract symptoms began in late spring after he received a BCG bladder instillation. They worsened after his last bladder instillation in late July and August. Although BCG may have been the initial trigger for his bladder inflammation, the pathology exam of his recent bladder biopsies and the negative AFB culture (not final) would suggest that his current lower urinary tract symptoms are not due to direct, active bladder wall infection with BCG. He will finish his prednisone taper in 4 days. He would like to continue ethambutol and rifampin at least until his AFB culture is final in another 3 weeks. He will continue on his current dose of hydroxyzine. He will follow-up with me in 3 weeks.      Relevant Medications   predniSONE (DELTASONE) 5 MG tablet       Michel Bickers, MD [facility] [phone] pager   [phone] cell 11/25/2014, 2:53  PM

## 2014-12-11 ENCOUNTER — Telehealth: Payer: Self-pay | Admitting: *Deleted

## 2014-12-11 LAB — ACID FAST CULTURE WITH REFLEXED SENSITIVITIES (MYCOBACTERIA): Acid Fast Smear: NEGATIVE

## 2014-12-11 LAB — ACID FAST CULTURE WITH REFLEXED SENSITIVITIES: ACID FAST CULTURE: NEGATIVE

## 2014-12-11 NOTE — Telephone Encounter (Signed)
Please let Charles Wright know that his AFB urine culture is negative and final. Ask him to stop taking the rifampin and ethambutol now.

## 2014-12-11 NOTE — Addendum Note (Signed)
Addended by: Michel Bickers on: 12/11/2014 06:09 PM   Modules accepted: Orders, Medications

## 2014-12-11 NOTE — Telephone Encounter (Signed)
Patient requesting refill of ethambutol.  Ok to refill?  Should he also refill the others? Landis Gandy, RN

## 2014-12-12 ENCOUNTER — Emergency Department (HOSPITAL_COMMUNITY): Payer: Medicare Other

## 2014-12-12 ENCOUNTER — Encounter (HOSPITAL_COMMUNITY): Payer: Self-pay | Admitting: Emergency Medicine

## 2014-12-12 ENCOUNTER — Inpatient Hospital Stay (HOSPITAL_COMMUNITY)
Admission: EM | Admit: 2014-12-12 | Discharge: 2014-12-14 | DRG: 281 | Disposition: A | Discharge status: Home / Self Care | Payer: Medicare Other | Attending: Cardiology | Admitting: Cardiology

## 2014-12-12 DIAGNOSIS — I25110 Atherosclerotic heart disease of native coronary artery with unstable angina pectoris: Secondary | ICD-10-CM | POA: Diagnosis not present

## 2014-12-12 DIAGNOSIS — K219 Gastro-esophageal reflux disease without esophagitis: Secondary | ICD-10-CM | POA: Diagnosis present

## 2014-12-12 DIAGNOSIS — Z79899 Other long term (current) drug therapy: Secondary | ICD-10-CM | POA: Diagnosis not present

## 2014-12-12 DIAGNOSIS — Z888 Allergy status to other drugs, medicaments and biological substances status: Secondary | ICD-10-CM

## 2014-12-12 DIAGNOSIS — Z6831 Body mass index (BMI) 31.0-31.9, adult: Secondary | ICD-10-CM | POA: Diagnosis not present

## 2014-12-12 DIAGNOSIS — I4892 Unspecified atrial flutter: Secondary | ICD-10-CM | POA: Diagnosis present

## 2014-12-12 DIAGNOSIS — I119 Hypertensive heart disease without heart failure: Secondary | ICD-10-CM | POA: Diagnosis present

## 2014-12-12 DIAGNOSIS — G4733 Obstructive sleep apnea (adult) (pediatric): Secondary | ICD-10-CM | POA: Diagnosis present

## 2014-12-12 DIAGNOSIS — I480 Paroxysmal atrial fibrillation: Secondary | ICD-10-CM | POA: Diagnosis present

## 2014-12-12 DIAGNOSIS — E669 Obesity, unspecified: Secondary | ICD-10-CM | POA: Diagnosis present

## 2014-12-12 DIAGNOSIS — R079 Chest pain, unspecified: Secondary | ICD-10-CM | POA: Diagnosis present

## 2014-12-12 DIAGNOSIS — C679 Malignant neoplasm of bladder, unspecified: Secondary | ICD-10-CM | POA: Diagnosis present

## 2014-12-12 DIAGNOSIS — E785 Hyperlipidemia, unspecified: Secondary | ICD-10-CM

## 2014-12-12 DIAGNOSIS — I214 Non-ST elevation (NSTEMI) myocardial infarction: Principal | ICD-10-CM

## 2014-12-12 DIAGNOSIS — C61 Malignant neoplasm of prostate: Secondary | ICD-10-CM | POA: Diagnosis present

## 2014-12-12 DIAGNOSIS — Z885 Allergy status to narcotic agent status: Secondary | ICD-10-CM

## 2014-12-12 DIAGNOSIS — N400 Enlarged prostate without lower urinary tract symptoms: Secondary | ICD-10-CM | POA: Diagnosis present

## 2014-12-12 DIAGNOSIS — Z91030 Bee allergy status: Secondary | ICD-10-CM | POA: Diagnosis not present

## 2014-12-12 DIAGNOSIS — I2510 Atherosclerotic heart disease of native coronary artery without angina pectoris: Secondary | ICD-10-CM

## 2014-12-12 DIAGNOSIS — Z8551 Personal history of malignant neoplasm of bladder: Secondary | ICD-10-CM | POA: Diagnosis not present

## 2014-12-12 DIAGNOSIS — I10 Essential (primary) hypertension: Secondary | ICD-10-CM | POA: Diagnosis present

## 2014-12-12 HISTORY — DX: Other specified postprocedural states: Z98.890

## 2014-12-12 HISTORY — DX: Atherosclerotic heart disease of native coronary artery without angina pectoris: I25.10

## 2014-12-12 HISTORY — DX: Benign prostatic hyperplasia without lower urinary tract symptoms: N40.0

## 2014-12-12 LAB — CREATININE, SERUM
CREATININE: 1.01 mg/dL (ref 0.61–1.24)
GFR calc non Af Amer: >60 mL/min (ref >60)

## 2014-12-12 LAB — LIPID PANEL
Cholesterol: 200 mg/dL (ref 0–200)
HDL: 45 mg/dL (ref >40)
LDL CALC: 137 mg/dL — AB (ref 0–99)
TRIGLYCERIDES: 88 mg/dL (ref <150)
Total CHOL/HDL Ratio: 4.4 RATIO
VLDL: 18 mg/dL (ref 0–40)

## 2014-12-12 LAB — BASIC METABOLIC PANEL
Anion gap: 8 (ref 5–15)
BUN: 21 mg/dL — AB (ref 6–20)
CALCIUM: 9.6 mg/dL (ref 8.9–10.3)
CO2: 26 mmol/L (ref 22–32)
Chloride: 104 mmol/L (ref 101–111)
Creatinine, Ser: 1.11 mg/dL (ref 0.61–1.24)
GFR calc Af Amer: >60 mL/min (ref >60)
Glucose, Bld: 118 mg/dL — ABNORMAL HIGH (ref 65–99)
POTASSIUM: 4.4 mmol/L (ref 3.5–5.1)
SODIUM: 138 mmol/L (ref 135–145)

## 2014-12-12 LAB — CBC WITH DIFFERENTIAL/PLATELET
Basophils Absolute: 0 10*3/uL (ref 0.0–0.1)
Basophils Relative: 0 %
EOS PCT: 3 %
Eosinophils Absolute: 0.3 10*3/uL (ref 0.0–0.7)
HEMATOCRIT: 44.8 % (ref 39.0–52.0)
Hemoglobin: 14.9 g/dL (ref 13.0–17.0)
LYMPHS ABS: 2.1 10*3/uL (ref 0.7–4.0)
LYMPHS PCT: 25 %
MCH: 29.8 pg (ref 26.0–34.0)
MCHC: 33.3 g/dL (ref 30.0–36.0)
MCV: 89.6 fL (ref 78.0–100.0)
MONO ABS: 0.6 10*3/uL (ref 0.1–1.0)
Monocytes Relative: 7 %
Neutro Abs: 5.4 10*3/uL (ref 1.7–7.7)
Neutrophils Relative %: 65 %
Platelets: 320 10*3/uL (ref 150–400)
RBC: 5 MIL/uL (ref 4.22–5.81)
RDW: 14.5 % (ref 11.5–15.5)
WBC: 8.3 10*3/uL (ref 4.0–10.5)

## 2014-12-12 LAB — CBC
HEMATOCRIT: 45.3 % (ref 39.0–52.0)
Hemoglobin: 15.3 g/dL (ref 13.0–17.0)
MCH: 30.3 pg (ref 26.0–34.0)
MCHC: 33.8 g/dL (ref 30.0–36.0)
MCV: 89.7 fL (ref 78.0–100.0)
Platelets: 337 10*3/uL (ref 150–400)
RBC: 5.05 MIL/uL (ref 4.22–5.81)
RDW: 14.5 % (ref 11.5–15.5)
WBC: 10.5 10*3/uL (ref 4.0–10.5)

## 2014-12-12 LAB — TSH: TSH: 3.195 u[IU]/mL (ref 0.350–4.500)

## 2014-12-12 LAB — I-STAT TROPONIN, ED: Troponin i, poc: 0 ng/mL (ref 0.00–0.08)

## 2014-12-12 LAB — TROPONIN I: Troponin I: 0.03 ng/mL (ref <0.031)

## 2014-12-12 MED ORDER — HEPARIN SODIUM (PORCINE) 5000 UNIT/ML IJ SOLN: 5000.0000 [IU] | Freq: Three times a day (TID) | INTRAMUSCULAR | Status: DC

## 2014-12-12 MED ORDER — SODIUM CHLORIDE 0.9 % IV SOLN: 250.0000 mL | INTRAVENOUS | Status: DC | PRN

## 2014-12-12 MED ORDER — IRBESARTAN 150 MG PO TABS
150.0000 mg | ORAL_TABLET | Freq: Every day | ORAL | Status: DC
  Administered 2014-12-13: 150 mg via ORAL
  Filled 2014-12-12: qty 1

## 2014-12-12 MED ORDER — SODIUM CHLORIDE 0.9 % WEIGHT BASED INFUSION
1.0000 mL/kg/h | INTRAVENOUS | Status: DC
  Administered 2014-12-13: 1 mL/kg/h via INTRAVENOUS

## 2014-12-12 MED ORDER — ONDANSETRON HCL 4 MG/2ML IJ SOLN: 4.0000 mg | Freq: Four times a day (QID) | INTRAMUSCULAR | Status: DC | PRN

## 2014-12-12 MED ORDER — FLECAINIDE ACETATE 50 MG PO TABS
50.0000 mg | ORAL_TABLET | Freq: Two times a day (BID) | ORAL | Status: DC
  Administered 2014-12-12 – 2014-12-13 (×3): 50 mg via ORAL
  Filled 2014-12-12 (×5): qty 1

## 2014-12-12 MED ORDER — SODIUM CHLORIDE 0.9 % WEIGHT BASED INFUSION
3.0000 mL/kg/h | INTRAVENOUS | Status: DC
  Administered 2014-12-13: 3 mL/kg/h via INTRAVENOUS

## 2014-12-12 MED ORDER — ASPIRIN 81 MG PO CHEW
324.0000 mg | CHEWABLE_TABLET | Freq: Once | ORAL | Status: AC
  Administered 2014-12-12: 324 mg via ORAL
  Filled 2014-12-12: qty 4

## 2014-12-12 MED ORDER — ALLOPURINOL 300 MG PO TABS
300.0000 mg | ORAL_TABLET | Freq: Every morning | ORAL | Status: DC
  Administered 2014-12-13 – 2014-12-14 (×2): 300 mg via ORAL
  Filled 2014-12-12 (×2): qty 1

## 2014-12-12 MED ORDER — ASPIRIN 81 MG PO CHEW
81.0000 mg | CHEWABLE_TABLET | ORAL | Status: AC
  Administered 2014-12-13: 81 mg via ORAL
  Filled 2014-12-12: qty 1

## 2014-12-12 MED ORDER — SODIUM CHLORIDE 0.9 % IJ SOLN: 3.0000 mL | INTRAMUSCULAR | Status: DC | PRN

## 2014-12-12 MED ORDER — ENOXAPARIN SODIUM 100 MG/ML ~~LOC~~ SOLN
1.0000 mg/kg | Freq: Once | SUBCUTANEOUS | Status: AC
Start: 2014-12-12 — End: 2014-12-12
  Administered 2014-12-12: 95 mg via SUBCUTANEOUS
  Filled 2014-12-12: qty 1

## 2014-12-12 MED ORDER — HYDROXYZINE HCL 25 MG PO TABS
25.0000 mg | ORAL_TABLET | Freq: Every day | ORAL | Status: DC
  Administered 2014-12-12 – 2014-12-13 (×2): 25 mg via ORAL
  Filled 2014-12-12 (×2): qty 1

## 2014-12-12 MED ORDER — METOPROLOL TARTRATE 50 MG PO TABS
50.0000 mg | ORAL_TABLET | Freq: Two times a day (BID) | ORAL | Status: DC
Start: 2014-12-12 — End: 2014-12-14
  Administered 2014-12-12 – 2014-12-14 (×4): 50 mg via ORAL
  Filled 2014-12-12 (×4): qty 1

## 2014-12-12 MED ORDER — SODIUM CHLORIDE 0.9 % IJ SOLN
3.0000 mL | Freq: Two times a day (BID) | INTRAMUSCULAR | Status: DC
  Administered 2014-12-12 – 2014-12-13 (×2): 3 mL via INTRAVENOUS

## 2014-12-12 MED ORDER — ASPIRIN EC 81 MG PO TBEC
81.0000 mg | DELAYED_RELEASE_TABLET | Freq: Every day | ORAL | Status: DC
  Administered 2014-12-14: 81 mg via ORAL
  Filled 2014-12-12 (×2): qty 1

## 2014-12-12 MED ORDER — NITROGLYCERIN 0.4 MG SL SUBL: 0.4000 mg | SUBLINGUAL_TABLET | SUBLINGUAL | Status: DC | PRN

## 2014-12-12 MED ORDER — ACETAMINOPHEN 325 MG PO TABS: 650.0000 mg | ORAL_TABLET | ORAL | Status: DC | PRN

## 2014-12-12 NOTE — Plan of Care (Signed)
Problem: Safety: Goal: Ability to remain free from injury will improve Outcome: Completed/Met Date Met:  12/12/14 Pt educated on safety regulations put in place. Educated on number on the board to Clinical research associate and tech. Pt verbalized understanding.

## 2014-12-12 NOTE — ED Provider Notes (Addendum)
CSN: [id number]     Arrival date & time 12/12/14  1124 History   First MD Initiated Contact with Patient 12/12/14 1212     Chief Complaint  Patient presents with  . Chest Pain  . Dizziness     HPI Patient presents with a sensation of pressure in the left side of his chest with radiation up to his left neck and left shoulder.  This occurred at approximately 10 AM today.  It lasted 20-30 minutes.  He became slightly diaphoretic and lightheaded.  He denies shortness of breath.  Denies nausea and vomiting.  He had one similar episode 3 days ago which was similar in nature and similar in severity.  He has a history of atrial fibrillation.  He states usually when he goes into atrial fibrillation he feels lightheaded but has never had chest pressure before.  He had no preceding palpitations.  Is currently without any discomfort or pain.  No known history of coronary artery disease.  He reports a stress test many years ago.  He's never had a heart catheterization.  He does have hypertension for which he takes medications.  He has hyperlipidemia as well but he has not been taking his medicine for cholesterol.  No history of diabetes.  Denies tobacco abuse.  He is currently not on anticoagulation for atrial fibrillation.  No other recent illness.  Both episodes before with exertion this week.  His cardiologist is Dr. Wynonia Lawman   Past Medical History  Diagnosis Date  . Hypertension   . Hyperlipidemia   . Gilbert's syndrome   . Wears glasses   . GERD (gastroesophageal reflux disease)     takes  OTC periodically  . Paroxysmal a-fib [facility])     cardiologist-  dr Wynonia Lawman  . OSA (obstructive sleep apnea)     2011 -- POSITIVE STUDY W/ CPAP RX BUT NON-COMPLIANT  . History of kidney stones   . Wears hearing aid     bilateral  . Bladder cancer (HCC)     BCG's tx's  . Prostate cancer [facility])     last PSA  2.9  (montiored by urologist  dr Junious Silk)  . Erythematous bladder mucosa   . Dysuria    Past Surgical  History  Procedure Laterality Date  . Orif left ankle fx  2005  . Bilateral inguinal hernia repair    . Cysto/ left retrograde pyelogram/ ureteroscopy / brush bx/ stent placement  05-01-2008  DR [facility]  . Cysto/ bladder bx's/ left ureteral stricture dilation/ right retrograde pyelogram/ left stent placement  05-15-2008  DR [facility]  . Transurethral resection of bladder tumor  10/22/2011    Procedure: TRANSURETHRAL RESECTION OF BLADDER TUMOR (TURBT);  Surgeon: Fredricka Bonine, MD;  Location: [facility];  Service: Urology;  Laterality: N/A;  TURBT, LEFT URETEROSCOPY, POSSIBLE URETERAL STENT C-ARM   . Ureteroscopy  10/22/2011    Procedure: URETEROSCOPY;  Surgeon: Fredricka Bonine, MD;  Location: [facility];  Service: Urology;  Laterality: Left;  . Prostate biopsy N/A 08/22/2012    Procedure: BIOPSY TRANSRECTAL ULTRASONIC PROSTATE (TUBP);  Surgeon: Fredricka Bonine, MD;  Location: [facility];  Service: Urology;  Laterality: N/A;  . Cystoscopy w/ retrogrades Left 08/22/2012    Procedure: CYSTOSCOPY WITH RETROGRADE PYELOGRAM;  left kidney washings;  Surgeon: Fredricka Bonine, MD;  Location: [facility];  Service: Urology;  Laterality: Left;  . Cystoscopy with biopsy  08/22/2012    Procedure: CYSTOSCOPY WITH BIOPSY;  Surgeon: Fredricka Bonine, MD;  Location[facility];  Service: Urology;;  . Cystoscopy/retrograde/ureteroscopy Bilateral 08/17/2013    Procedure: CYSTOSCOPY, BLADDER BIOPSY WITH BILATERAL RETROGRADE PYELOGRAM, LEFT URETEROSCOPY AND DILATION OF STRICTURE ;  Surgeon: [name], MD;  Location: [facility];  Service: Urology;  Laterality: Bilateral;  . Lumbar laminectomy/decompression microdiscectomy Left 12/03/2013    Procedure: Left Lumbar three-four diskectomy with Lumbar four laminectomy ;  Surgeon: Newman Pies, MD;  Location: [place] [facility];  Service:  Neurosurgery;  Laterality: Left;  Left Lumbar three-four diskectomy with Lumbar four laminectomy   . Wisdom tooth extraction    . Cystoscopy with retrograde pyelogram, ureteroscopy and stent placement Bilateral 07/30/2014    Procedure: CYSTOSCOPY WITH BLADDER BX [place],;  Surgeon: [name], MD;  Location: [facility];  Service: Urology;  Laterality: Bilateral;  . Transthoracic echocardiogram  06-07-2014   dr Wynonia Lawman    moderate concentric LVH, ef 31%, grade I diastolic dysfunction/  mild LAE/  mild MR/  trace PR  . Cystoscopy with biopsy N/A 11/08/2014    Procedure: CYSTOSCOPY/BIOPSPY BLADDER INSTILLATION OF MARCAINE AND PYRIDIUM;  Surgeon: [name], MD;  Location: [facility];  Service: Urology;  Laterality: N/A;   Family History  Problem Relation Age of Onset  . Leukemia Mother   . Heart attack Father   . Cancer Sister    Social History  Substance Use Topics  . Smoking status: Never Smoker   . Smokeless tobacco: Never Used  . Alcohol Use: 4.2 oz/week    7 Glasses of wine per week     Comment: red wine glass daily    Review of Systems  All other systems reviewed and are negative.     Allergies  Bee venom; Losartan; and Oxycodone  Home Medications   Prior to Admission medications   Medication Sig Start Date End Date Taking? Authorizing Provider  allopurinol (ZYLOPRIM) 300 MG tablet Take 300 mg by mouth every morning.    Yes Historical Provider, MD  Cholecalciferol (VITAMIN D-3) 1000 UNITS CAPS Take 1,000 Units by mouth 2 (two) times daily.    Yes Historical Provider, MD  EPINEPHrine 0.3 mg/0.3 mL IJ SOAJ injection Inject 0.3 mLs (0.3 mg total) into the muscle once. 08/18/14  Yes Jennifer Piepenbrink, PA-C  flecainide (TAMBOCOR) 50 MG tablet Take 50 mg by mouth 2 (two) times daily.   Yes Historical Provider, MD  Glucosamine Sulfate (DONA PO) Take 1 tablet by mouth 2 (two) times daily.    Yes Historical Provider, MD   hydrOXYzine (ATARAX/VISTARIL) 25 MG tablet Take 1 tablet (25 mg total) by mouth at bedtime. 11/08/14  Yes [name], MD  influenza vac recombinant HA trivalent (FLUBLOK) injection Inject 0.5 mLs into the muscle once.   Yes Historical Provider, MD  metoprolol (LOPRESSOR) 50 MG tablet Take 50 mg by mouth 2 (two) times daily.   Yes Historical Provider, MD  Multiple Vitamin (MULTIVITAMIN) tablet Take 1 tablet by mouth 2 (two) times daily.    Yes Historical Provider, MD  rifampin (RIFADIN) 300 MG capsule Take 300 mg by mouth 2 (two) times daily. 11/26/14  Yes Historical Provider, MD  valsartan (DIOVAN) 160 MG tablet Take 160 mg by mouth every morning.    Yes Historical Provider, MD   BP 101/71 mmHg  Pulse 59  Temp(Src) 97.7 F (36.5 C) (Oral)  Resp 14  SpO2 96% Physical Exam  Constitutional: He is oriented to person, place, and time. He  appears well-developed and well-nourished.  HENT:  Head: Normocephalic and atraumatic.  Eyes: EOM are normal.  Neck: Normal range of motion.  Cardiovascular: Normal rate, regular rhythm, normal heart sounds and intact distal pulses.   Pulmonary/Chest: Effort normal and breath sounds normal. No respiratory distress.  Abdominal: Soft. He exhibits no distension. There is no tenderness.  Musculoskeletal: Normal range of motion.  Neurological: He is alert and oriented to person, place, and time.  Skin: Skin is warm and dry.  Psychiatric: He has a normal mood and affect. Judgment normal.  Nursing note and vitals reviewed.   ED Course  Procedures (including critical care time) Labs Review Labs Reviewed  BASIC METABOLIC PANEL - Abnormal; Notable for the following:    Glucose, Bld 118 (*)    BUN 21 (*)    All other components within normal limits  CBC  I-STAT TROPOININ, ED    Imaging Review Dg Chest 2 View  12/12/2014  CLINICAL DATA:  Chest pain left side radiating to left shoulder. Hypertension. EXAM: CHEST  2 VIEW COMPARISON:  January 16, 2013  FINDINGS: There is scarring in the bases, slightly more on the left than on the right. No edema or consolidation. Heart size and pulmonary vascularity are normal. No adenopathy. There is degenerative change in the upper lumbar region with slight anterior wedging of an upper lumbar vertebral body, stable. IMPRESSION: Areas of scarring in the bases, slightly more on the left than on the right, stable. No edema or consolidation. Electronically Signed   By: Lowella Grip III M.D.   On: 12/12/2014 11:59   I have personally reviewed and evaluated these images and lab results as part of my medical decision-making.  ECG interpretation #1   Date: 12/12/2014  1130  Rate: 66  Rhythm: normal sinus rhythm  QRS Axis: normal  Intervals: normal  ST/T Wave abnormalities: normal  Conduction Disutrbances: none  Narrative Interpretation:   Old EKG Reviewed: No significant changes noted   ECG interpretation #2  Date: 12/12/2014 1301pm  Rate: 58  Rhythm: normal sinus rhythm  QRS Axis: normal  Intervals: normal  ST/T Wave abnormalities: normal  Conduction Disutrbances: none  Narrative Interpretation:   Old EKG Reviewed: No significant changes noted     MDM   Final diagnoses:  None    Troponin normal and EKG 2 are normal.  Patient is currently without any symptoms.  His symptoms are concerning for unstable angina.  I will discuss his case with his cardiologist.  My personal opinion is that the patient should be admitted for serial troponins and likely invasive versus noninvasive testing for further risk stratification of his concerning symptoms  1:24 PM I spoke with Dr Wynonia Lawman who agrees with admission and recommends transfer and admission to [facility]. He will see him this afternoon     Jola Schmidt, MD 12/12/14 [place], MD 12/12/14 1324

## 2014-12-12 NOTE — Telephone Encounter (Signed)
Notified patient.thank you.

## 2014-12-12 NOTE — ED Notes (Addendum)
Pt states left/central chest pain/pressure with dizziness starting an hour ago. States the pain has gone, but he still feels lightheaded and diaphoretic. Denies back pain, SOB, N/V/D, abd pain. Hx of A. Fib. Took 1 81mg  aspirin 1 hour prior to arrival.

## 2014-12-12 NOTE — ED Notes (Signed)
Patient in x-ray currently, not in room.

## 2014-12-12 NOTE — Progress Notes (Signed)
Patient ID: Charles Wright, male   DOB: 04/13/1935, 79 y.o.   MRN: [id number]  Charles Wright    Date of visit:  05/16/2014 DOB:  11/15/1935    Age:  79 yrs. Medical record number:  [id number]     Account number:  [id number] Primary Care Provider: READE, ROBERT ____________________________ CURRENT DIAGNOSES  1. Dizziness and giddiness  2. Abnormal electrocardiogram [ECG]  3. Sleep apnea  4. Essential hypertension  5. Obesity ____________________________ ALLERGIES  Lisinopril, Intolerance-cough  Losartan, Intolerance-cough  Oxycodone, Mood swings ____________________________ MEDICATIONS  1. multivitamin tablet, 1 p.o. daily  2. Vitamin D3 1,000 unit tablet, 1 p.o. daily  3. Glucosamine 1500 Complex 500 mg-400 mg capsule, 1 p.o. daily  4. ibuprofen 200 mg tablet, PRN  5. EpiPen Jr 2-Pak 0.15 mg/0.3 mL injection,auto-injector, PRN  6. tamsulosin 0.4 mg capsule, 1 p.o. daily  7. valsartan 160 mg tablet, 1 p.o. daily ____________________________ CHIEF COMPLAINTS  Light headed ____________________________ HISTORY OF PRESENT ILLNESS Patient seen for evaluation of abnormal EKG and possible atrial fibrillation. The patient has previously been in good health except for history of some hypertension and is still active working. He tries to get some exercise and denies angina and has no PND orthopnea or significant claudication. He recently developed low back pain and has found it difficult to exercise and had back surgery about 6 months ago. He recently developed pain involving his right leg and there is a question about whether he had DVT and when he went in for the evaluation he was found to have a slow pulse rate and had an EKG showing possible atrial fibrillation and I was asked to evaluate him. He is unaware of any palpitations. He does note an abnormal EKG rhythm strip going back several years. He eventually was found to have a Baker's cyst involving his right leg and has been out trying to get some  exercise and currently is feeling better about his overall condition. He is still somewhat physically deconditioned and also is overweight. He also notes that he will develop occasional dizziness when he initially stands up or if he bends over and then stands up. He has not had any significant syncope. He does have a history of kidney stones and is also being treated for both bladder cancer as well as prostate cancer. He has a prior history of sleep apnea but is unable to tolerate CPAP and wears a mouthpiece for it. ____________________________ PAST HISTORY  Past Medical Illnesses:  kidney stones, prostate cancer, sleep apnea, bladder cancer, gilberts syndrome, hypertension, obesity, BPH;  Cardiovascular Illnesses:  no previous history of cardiac disease.;  Surgical Procedures:  hernia repair, laminectomy lumbar, fx l fibula, bladder surg, vasectomy;  NYHA Classification:  I;  Canadian Angina Classification:  Class 0: Asymptomatic;  Cardiology Procedures-Invasive:  no history of prior cardiac procedures;  Cardiology Procedures-Noninvasive:  treadmill;  LVEF not documented,   ____________________________ CARDIO-PULMONARY TEST DATES EKG Date:  05/16/2014;   ____________________________ FAMILY HISTORY Father -- Father dead, Heart Attack Mother -- Mother dead, Leukemia Sister -- Sister alive and well ____________________________ SOCIAL HISTORY Alcohol Use:  wine 1 per day;  Smoking:  never smoked;  Lifestyle:  divorced, remarried and 2 daughters;  Exercise:  1-2 times q week;  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 Respiratory: denies dyspnea, cough, wheezing or hemoptysis. Cardiovascular:  please review HPI Abdominal: denies  dyspepsia, GI bleeding, constipation, or diarrhea Genitourinary-Male: nocturia  Musculoskeletal:  lumbar disc disease, Baker's cyst Neurological:  denies headaches, stroke, or TIA Psychiatric:  denies depression or anxiety Hematological/Immunologic:  denies any food allergies, bleeding disorders. ____________________________ PHYSICAL EXAMINATION VITAL SIGNS  Blood Pressure:  110/60 Sitting, Right arm, regular cuff  , 104/56 Standing, Right arm and regular cuff   Pulse:  72/min. Weight:  216.00 lbs. Height:  68"BMI: 33  Constitutional:  pleasant white male in no acute distress, mildly obese Skin:  warm and dry to touch, no apparent skin lesions, or masses noted. Head:  normocephalic, normal hair pattern, no masses or tenderness Eyes:  EOMS Intact, PERRLA, C and S clear, Funduscopic exam not done. ENT:  ears, nose and throat reveal no gross abnormalities.  Dentition good. Neck:  supple, without massess. No JVD, thyromegaly or carotid bruits. Carotid upstroke normal. Chest:  normal symmetry, clear to auscultation. Cardiac:  regular rhythm, normal S1 and S2, No S3 or S4, no murmurs, gallops or rubs detected. Abdomen:  abdomen soft,non-tender, no masses, no hepatospenomegaly, or aneurysm noted Peripheral Pulses:  the femoral,dorsalis pedis, and posterior tibial pulses are full and equal bilaterally with no bruits auscultated. Extremities & Back:  mild bilateral venous insufficiency changes present Neurological:  no gross motor or sensory deficits noted, affect appropriate, oriented x3. ____________________________ IMPRESSIONS/PLAN  1. Abnormal EKG with PACs 2. Sleep apnea 3. Hypertension 4. Hyperlipidemia  Recommendations:  Extensive history reviewed. He is having some vague dizziness and his EKG today shows sinus rhythm with PACs and occasional sinus tachycardia. I have recommended that he wear a cardiac event monitor to characterize the palpitations. He also needs an echocardiogram to  assess for structural heart disease. His estimated 10 your cardiovascular risk is 26.9% and he would benefit from statin treatment. He was somewhat resistant to this idea today and I told him we can discuss this later. Lab work requested. Followup after he has had a chance to wear the event monitor. ____________________________ TODAYS ORDERS  1. 2D, color flow, doppler: At Patient Convenience  2. [place]: Today  3. Return Visit: 1 month  4. Obtain Lab/Xray from PMD: 0  5. 12 Lead EKG: Today                       ____________________________ Cardiology Physician:  W. Spencer Zsofia Prout, Jr. MD FACC    

## 2014-12-12 NOTE — H&P (Signed)
History and Physical   Admit date: 12/12/2014 Name:  Charles Wright Medical record number: [id number] DOB/Age:  Mar 24, 1935  79 y.o. male  Referring Physician:   [facility]  Primary Cardiologist:  Dr. Wynonia Lawman  Primary Physician:   Dr. Maury Dus  Chief complaint/reason for admission: Chest pain  HPI:  This 79 year old male has a history of paroxysmal atrial flutter and fibrillation.  He was seen by me in April of this year and was anticoagulated and later placed on flecainide which has resulted in suppression of his atrial arrhythmias.  He has had difficulty with bladder cancer underwent resection and later BCG treatment.  He has had urinary bleeding and his anticoagulation had to be stopped.  He eventually was seen by the infectious disease doctors and for a while was on rifampin but as of 2 weeks ago his urinary frequency and retention increased and his hydroxyzine was increased.  Since then he has felt much better.  He just received a call from the infectious disease doctors saying that he did not need to use rifampin.  He  On Monday of this week he was working outside and had midsternal chest pain with radiation to his neck that lasted around 30 minutes.  He again had similar discomfort today while he was changing a light fixture described as pressure radiating to his neck and lasted 45 minutes.  He had mild diaphoresis with it and came to the emergency room.  He had a treadmill test in July on flecainide and failed to reach his target heart rate but had no changes.  His estimated 10 year risk of cardiovascular events is 25% and previously was recommended to take statin therapy that he refused.  He has not had chest pain.  He denies PND, orthopnea or edema.   Past Medical History  Diagnosis Date  . Hypertension   . Hyperlipidemia   . Gilbert's syndrome   . Wears glasses   . GERD (gastroesophageal reflux disease)     takes  OTC periodically  . Paroxysmal a-fib [facility])    cardiologist-  dr Wynonia Lawman  . OSA (obstructive sleep apnea)     2011 -- POSITIVE STUDY W/ CPAP RX BUT NON-COMPLIANT  . History of kidney stones   . Bladder cancer (HCC)     BCG's tx's  . Prostate cancer [facility])     last PSA  2.9  (montiored by urologist  dr Junious Silk)  . BPH (benign prostatic hypertrophy)     Past Surgical History  Procedure Laterality Date  . Orif left ankle fx  2005  . Bilateral inguinal hernia repair    . Transurethral resection of bladder tumor  10/22/2011    Procedure: TRANSURETHRAL RESECTION OF BLADDER TUMOR (TURBT);  Surgeon: Fredricka Bonine, MD;  Location: [facility];  Service: Urology;  Laterality: N/A;  TURBT, LEFT URETEROSCOPY, POSSIBLE URETERAL STENT   . Ureteroscopy  10/22/2011    Procedure: URETEROSCOPY;  Surgeon: Fredricka Bonine, MD;  Location: [facility];  Service: Urology;  Laterality: Left;  . Prostate biopsy N/A 08/22/2012    Procedure: BIOPSY TRANSRECTAL ULTRASONIC PROSTATE (TUBP);  Surgeon: Fredricka Bonine, MD;  Location: [facility];  Service: Urology;  Laterality: N/A;  . Cystoscopy w/ retrogrades Left 08/22/2012    Procedure: CYSTOSCOPY WITH RETROGRADE PYELOGRAM;  left kidney washings;  Surgeon: Fredricka Bonine, MD;  Location: [facility];  Service: Urology;  Laterality: Left;  . Cystoscopy with biopsy  08/22/2012    Procedure: CYSTOSCOPY WITH BIOPSY;  Surgeon: Fredricka Bonine, MD;  Location: [facility];  Service: Urology;;  . Cystoscopy/retrograde/ureteroscopy Bilateral 08/17/2013    Procedure: CYSTOSCOPY, BLADDER BIOPSY WITH BILATERAL RETROGRADE PYELOGRAM, LEFT URETEROSCOPY AND DILATION OF STRICTURE ;  Surgeon: [name], MD;  Location: [facility];  Service: Urology;  Laterality: Bilateral;  . Lumbar laminectomy/decompression microdiscectomy Left 12/03/2013    Procedure: Left Lumbar three-four diskectomy with Lumbar  four laminectomy ;  Surgeon: Newman Pies, MD;  Location: [facility];  Service: Neurosurgery;  Laterality: Left;  Left Lumbar three-four diskectomy with Lumbar four laminectomy   . Wisdom tooth extraction    . Cystoscopy with retrograde pyelogram, ureteroscopy and stent placement Bilateral 07/30/2014    Procedure: CYSTOSCOPY WITH BLADDER BX [place],;  Surgeon: [name], MD;  Location: [facility];  Service: Urology;  Laterality: Bilateral;  . Cystoscopy with biopsy N/A 11/08/2014    Procedure: CYSTOSCOPY/BIOPSPY BLADDER INSTILLATION OF MARCAINE AND PYRIDIUM;  Surgeon: [name], MD;  Location: [facility];  Service: Urology;  Laterality: N/A;   Allergies: is allergic to bee venom; losartan; and oxycodone.   Medications: Prior to Admission medications   Medication Sig Start Date End Date Taking? Authorizing Provider  allopurinol (ZYLOPRIM) 300 MG tablet Take 300 mg by mouth every morning.    Yes Historical Provider, MD  Cholecalciferol (VITAMIN D-3) 1000 UNITS CAPS Take 1,000 Units by mouth 2 (two) times daily.    Yes Historical Provider, MD  EPINEPHrine 0.3 mg/0.3 mL IJ SOAJ injection Inject 0.3 mLs (0.3 mg total) into the muscle once. 08/18/14  Yes Jennifer Piepenbrink, PA-C  flecainide (TAMBOCOR) 50 MG tablet Take 50 mg by mouth 2 (two) times daily.   Yes Historical Provider, MD  Glucosamine Sulfate (DONA PO) Take 1 tablet by mouth 2 (two) times daily.    Yes Historical Provider, MD  hydrOXYzine (ATARAX/VISTARIL) 25 MG tablet Take 1 tablet (25 mg total) by mouth at bedtime. 11/08/14  Yes [name], MD  influenza vac recombinant HA trivalent (FLUBLOK) injection Inject 0.5 mLs into the muscle once.   Yes Historical Provider, MD  metoprolol (LOPRESSOR) 50 MG tablet Take 50 mg by mouth 2 (two) times daily.   Yes Historical Provider, MD  Multiple Vitamin (MULTIVITAMIN) tablet Take 1 tablet by mouth 2 (two) times daily.    Yes  Historical Provider, MD  rifampin (RIFADIN) 300 MG capsule Take 300 mg by mouth 2 (two) times daily. 11/26/14  Yes Historical Provider, MD  valsartan (DIOVAN) 160 MG tablet Take 160 mg by mouth every morning.    Yes Historical Provider, MD    Family History:  Family Status  Relation Status Death Age  . Mother Deceased 34    leukemia  . Father Deceased 39    CAD, MI  . Sister Alive     BREAST CANCER  . Maternal Grandmother Deceased   . Maternal Grandfather Deceased   . Paternal Grandmother Deceased   . Paternal Grandfather Deceased     Social History:   reports that he has never smoked. He has never used smokeless tobacco. He reports that he drinks about 4.2 oz of alcohol per week. He reports that he does not use illicit drugs.   Social History   Social History Narrative    Review of Systems:  Other than as noted above, the remainder of the review of systems is normal  Physical Exam: BP 98/68 mmHg  Pulse 66  Temp(Src) 97.6 F (36.4 C) (Oral)  Resp 18  Ht 5' 7.5" (1.715 m)  Wt 93 kg (205 lb 0.4 oz)  BMI 31.62 kg/m2  SpO2 98% General appearance: Pleasant male in no acute distress Head: Normocephalic, without obvious abnormality, atraumatic Eyes: conjunctivae/corneas clear. PERRL, EOM's intact. Fundi not examined  Neck: no adenopathy, no carotid bruit, no JVD and supple, symmetrical, trachea midline Lungs: clear to auscultation bilaterally Heart: regular rate and rhythm, S1, S2 normal, no murmur, click, rub or gallop Abdomen: soft, non-tender; bowel sounds normal; no masses,  no organomegaly Rectal: deferred Extremities: extremities normal, atraumatic, no cyanosis or edema Pulses: 2+ and symmetric Skin: Skin color, texture, turgor normal. No rashes or lesions Neurologic: Grossly normal  Labs: CBC  Recent Labs  12/12/14 1721  WBC 8.3  RBC 5.00  HGB 14.9  HCT 44.8  PLT 320  MCV 89.6  MCH 29.8  MCHC 33.3  RDW 14.5  LYMPHSABS 2.1  MONOABS 0.6  EOSABS 0.3   BASOSABS 0.0   CMP   Recent Labs  12/12/14 1154  NA 138  K 4.4  CL 104  CO2 26  GLUCOSE 118*  BUN 21*  CREATININE 1.11  CALCIUM 9.6  GFRNONAA >60  GFRAA >60   Cardiac Panel (last 3 results) Troponin (Point of Care Test)  Recent Labs  12/12/14 1159  TROPIPOC 0.00   EKG: Sinus rhythm, no acute ST abnormality  Radiology: Scarring in basis, wedge deformity of upper back vertebra   IMPRESSIONS: 1.  Chest pain consistent with unstable angina pectoris 2.  Hypertension 3.  Hyperlipidemia currently untreated 4.  Paroxysmal atrial flutter currently in sinus rhythm 5.  History of bladder cancer with recent treatment with BCG 6.  Prostate cancer  PLAN: Patient will receive Lovenox tonight.  He is currently pain-free.  Plan catheterization and he would prefer the radial approach.  Have asked [facility] to do this tomorrow. Cardiac catheterization was discussed with the patient fully including risks of myocardial infarction, death, stroke, bleeding, arrhythmia, dye allergy, renal insufficiency or bleeding.  The patient understands and is willing to proceed.  Possibility of intervention as well as drug-eluting stent placement discussed with patient to need to take anticoagulation.   Signed: Kerry Hough MD [facility]  12/12/2014, 6:13 PM

## 2014-12-12 NOTE — ED Notes (Signed)
No lab draw in triage pt going to exray then back to room

## 2014-12-13 ENCOUNTER — Encounter (HOSPITAL_COMMUNITY)
Admission: EM | Disposition: A | Discharge status: Home / Self Care | Payer: Self-pay | Source: Home / Self Care | Attending: Cardiology

## 2014-12-13 DIAGNOSIS — I25110 Atherosclerotic heart disease of native coronary artery with unstable angina pectoris: Secondary | ICD-10-CM

## 2014-12-13 HISTORY — PX: CARDIAC CATHETERIZATION: SHX172

## 2014-12-13 LAB — PROTIME-INR
INR: 1.12 (ref 0.00–1.49)
Prothrombin Time: 14.6 seconds (ref 11.6–15.2)

## 2014-12-13 LAB — TROPONIN I
TROPONIN I: 0.16 ng/mL — AB (ref <0.031)
Troponin I: 0.15 ng/mL — ABNORMAL HIGH (ref <0.031)

## 2014-12-13 SURGERY — LEFT HEART CATH AND CORONARY ANGIOGRAPHY
Anesthesia: LOCAL

## 2014-12-13 MED ORDER — SODIUM CHLORIDE 0.9 % IV SOLN: 250.0000 mL | INTRAVENOUS | Status: DC | PRN

## 2014-12-13 MED ORDER — HEPARIN SODIUM (PORCINE) 1000 UNIT/ML IJ SOLN
INTRAMUSCULAR | Status: AC
  Filled 2014-12-13: qty 1

## 2014-12-13 MED ORDER — SODIUM CHLORIDE 0.9 % WEIGHT BASED INFUSION
3.0000 mL/kg/h | INTRAVENOUS | Status: AC
  Administered 2014-12-13: 3 mL/kg/h via INTRAVENOUS

## 2014-12-13 MED ORDER — NITROGLYCERIN 1 MG/10 ML FOR IR/CATH LAB
INTRA_ARTERIAL | Status: AC
  Filled 2014-12-13: qty 10

## 2014-12-13 MED ORDER — ATORVASTATIN CALCIUM 80 MG PO TABS
80.0000 mg | ORAL_TABLET | Freq: Every day | ORAL | Status: DC
  Administered 2014-12-13: 80 mg via ORAL
  Filled 2014-12-13: qty 1

## 2014-12-13 MED ORDER — FENTANYL CITRATE (PF) 100 MCG/2ML IJ SOLN
INTRAMUSCULAR | Status: DC | PRN
  Administered 2014-12-13: 25 ug via INTRAVENOUS

## 2014-12-13 MED ORDER — MIDAZOLAM HCL 2 MG/2ML IJ SOLN
INTRAMUSCULAR | Status: AC
  Filled 2014-12-13: qty 4

## 2014-12-13 MED ORDER — HEPARIN (PORCINE) IN NACL 2-0.9 UNIT/ML-% IJ SOLN
INTRAMUSCULAR | Status: AC
  Filled 2014-12-13: qty 1500

## 2014-12-13 MED ORDER — LIDOCAINE HCL (PF) 1 % IJ SOLN
INTRAMUSCULAR | Status: AC
  Filled 2014-12-13: qty 30

## 2014-12-13 MED ORDER — SODIUM CHLORIDE 0.9 % IJ SOLN: 3.0000 mL | INTRAMUSCULAR | Status: DC | PRN

## 2014-12-13 MED ORDER — FENTANYL CITRATE (PF) 100 MCG/2ML IJ SOLN
INTRAMUSCULAR | Status: AC
  Filled 2014-12-13: qty 4

## 2014-12-13 MED ORDER — NITROGLYCERIN 1 MG/10 ML FOR IR/CATH LAB
INTRA_ARTERIAL | Status: DC | PRN
  Administered 2014-12-13: 10 mL

## 2014-12-13 MED ORDER — IOHEXOL 350 MG/ML SOLN
INTRAVENOUS | Status: DC | PRN
  Administered 2014-12-13: 70 mL via INTRAVENOUS

## 2014-12-13 MED ORDER — VERAPAMIL HCL 2.5 MG/ML IV SOLN
INTRAVENOUS | Status: AC
  Filled 2014-12-13: qty 2

## 2014-12-13 MED ORDER — CLOPIDOGREL BISULFATE 75 MG PO TABS
75.0000 mg | ORAL_TABLET | Freq: Every day | ORAL | Status: DC
  Administered 2014-12-13 – 2014-12-14 (×2): 75 mg via ORAL
  Filled 2014-12-13 (×2): qty 1

## 2014-12-13 MED ORDER — ENOXAPARIN SODIUM 100 MG/ML ~~LOC~~ SOLN: 1.0000 mg/kg | Freq: Two times a day (BID) | SUBCUTANEOUS | Status: DC

## 2014-12-13 MED ORDER — SODIUM CHLORIDE 0.9 % IJ SOLN
3.0000 mL | Freq: Two times a day (BID) | INTRAMUSCULAR | Status: DC
  Administered 2014-12-13 – 2014-12-14 (×2): 3 mL via INTRAVENOUS

## 2014-12-13 MED ORDER — VERAPAMIL HCL 2.5 MG/ML IV SOLN
INTRA_ARTERIAL | Status: DC | PRN
  Administered 2014-12-13: 10 mL via INTRA_ARTERIAL

## 2014-12-13 MED ORDER — MIDAZOLAM HCL 2 MG/2ML IJ SOLN
INTRAMUSCULAR | Status: DC | PRN
  Administered 2014-12-13: 1 mg via INTRAVENOUS

## 2014-12-13 MED ORDER — ISOSORBIDE MONONITRATE ER 30 MG PO TB24
30.0000 mg | ORAL_TABLET | Freq: Every day | ORAL | Status: DC
  Administered 2014-12-13 – 2014-12-14 (×2): 30 mg via ORAL
  Filled 2014-12-13 (×2): qty 1

## 2014-12-13 MED ORDER — HEPARIN SODIUM (PORCINE) 1000 UNIT/ML IJ SOLN
INTRAMUSCULAR | Status: DC | PRN
  Administered 2014-12-13: 5000 [IU] via INTRAVENOUS

## 2014-12-13 MED ORDER — ATORVASTATIN CALCIUM 40 MG PO TABS: 40.0000 mg | ORAL_TABLET | Freq: Every day | ORAL | Status: DC

## 2014-12-13 SURGICAL SUPPLY — 13 items
CATH INFINITI 5FR ANG PIGTAIL (CATHETERS) ×2 IMPLANT
CATH LAUNCHER 5F RADR (CATHETERS) ×1 IMPLANT
CATH OPTITORQUE TIG 4.0 5F (CATHETERS) ×2 IMPLANT
CATH SITESEER 5F NTR (CATHETERS) ×2 IMPLANT
CATHETER LAUNCHER 5F RADR (CATHETERS) ×2
DEVICE RAD COMP TR BAND LRG (VASCULAR PRODUCTS) ×2 IMPLANT
GLIDESHEATH SLEND A-KIT 6F 22G (SHEATH) ×2 IMPLANT
KIT HEART LEFT (KITS) ×2 IMPLANT
PACK CARDIAC CATHETERIZATION (CUSTOM PROCEDURE TRAY) ×2 IMPLANT
SYR MEDRAD MARK V 150ML (SYRINGE) ×2 IMPLANT
TRANSDUCER W/STOPCOCK (MISCELLANEOUS) ×2 IMPLANT
TUBING CIL FLEX 10 FLL-RA (TUBING) ×2 IMPLANT
WIRE SAFE-T 1.5MM-J .035X260CM (WIRE) ×2 IMPLANT

## 2014-12-13 NOTE — Interval H&P Note (Signed)
History and Physical Interval Note:  12/13/2014 12:57 PM  [name]  has presented today for surgery, with the diagnosis of Unstable Angina  The various methods of treatment have been discussed with the patient and family. After consideration of risks, benefits and other options for treatment, the patient has consented to  Procedure(s): Left Heart Cath and Coronary Angiography (N/A) as a surgical intervention .  The patient's history has been reviewed, patient examined, no change in status, stable for surgery.  I have reviewed the patient's chart and labs.  Questions were answered to the patient's satisfaction.     Cath Lab Visit (complete for each Cath Lab visit)  Clinical Evaluation Leading to the Procedure:   ACS: Yes.    Non-ACS:    Anginal Classification: CCS IV  Anti-ischemic medical therapy: Minimal Therapy (1 class of medications)  Non-Invasive Test Results: No non-invasive testing performed  Prior CABG: No previous CABG   TIMI SCORE  Patient Information:  TIMI Score is 4  UA/NSTEMI and intermediate-risk features (e.g., TIMI score 3?4) for short-term risk of death or nonfatal MI  Revascularization of the presumed culprit artery   A (8)  Indication: 10; Score: 8    HARDING, DAVID W

## 2014-12-13 NOTE — Progress Notes (Signed)
Nutrition Brief Note  Patient identified on the Malnutrition Screening Tool (MST) Report  Wt Readings from Last 15 Encounters:  12/13/14 205 lb 0.4 oz (93 kg)  11/25/14 204 lb (92.534 kg)  11/08/14 204 lb (92.534 kg)  11/06/14 207 lb (93.895 kg)  10/28/14 206 lb (93.441 kg)  09/09/14 218 lb 12.8 oz (99.247 kg)  07/30/14 215 lb (97.523 kg)  07/26/14 215 lb (97.523 kg)  11/29/13 225 lb 12.8 oz (102.422 kg)  08/17/13 214 lb (97.07 kg)  01/26/13 210 lb (95.255 kg)  08/22/12 218 lb (98.884 kg)  10/22/11 224 lb (101.606 kg)   This 79 year old male has a history of paroxysmal atrial flutter and fibrillation.On Monday of this week he was working outside and had midsternal chest pain with radiation to his neck that lasted around 30 minutes. He again had similar discomfort today while he was changing a light fixture described as pressure radiating to his neck and lasted 45 minutes. He had mild diaphoresis with it and came to the emergency room. He had a treadmill test in July on flecainide and failed to reach his target heart rate but had no changes. His estimated 10 year risk of cardiovascular events is 25% and previously was recommended to take statin therapy that he refused. He has not had chest pain. He denies PND, orthopnea or edema.  Pt admitted with NSTEMI and unstable angina.   Spoke with pt at bedside. He reveals he lost about 20# within the past 6 months, as a result of complications from bladder cancer treatment, which caused him to have a poor appetite. However, this is not consistent with wt hx (noted 8.8% wt loss within the past year). He was eating well PTA, consuming 3 meals per day. He reports he is hungry now. Denies offer of supplements; takes MVI at home.   Nutrition-Focused physical exam completed. Findings are no fat depletion, no muscle depletion, and no edema.   Spoke with RN; pt is NPO for cardiac cath today. Pt to undergo procedure around noon.  Body mass index is  31.62 kg/(m^2). Patient meets criteria for obesity, class I based on current BMI.   Current diet order is NPO, patient is consuming approximately n/a% of meals at this time. Labs and medications reviewed.   No nutrition interventions warranted at this time. If nutrition issues arise, please consult RD.   Aldin Drees A. Jimmye Norman, RD, LDN, CDE Pager: [phone] After hours Pager: [phone]

## 2014-12-13 NOTE — Interval H&P Note (Signed)
History and Physical Interval Note:  12/13/2014 12:55 PM  Charles Wright  has presented today for surgery, with the diagnosis of Unstable Angina (mild troponin +).    The various methods of treatment have been discussed with the patient and family. After consideration of risks, benefits and other options for treatment, the patient has consented to  Procedure(s): Left Heart Cath and Coronary Angiography (N/A) with possible percutaneous coronary intervention as a surgical intervention .  The patient's history has been reviewed, patient examined, no change in status, stable for surgery.  I have reviewed the patient's chart and labs.  Questions were answered to the patient's satisfaction.       Jenina Moening W

## 2014-12-13 NOTE — Progress Notes (Signed)
UR Completed Loretha Ure Graves-Bigelow, RN,BSN [phone]  

## 2014-12-13 NOTE — Progress Notes (Signed)
Patient stable after catheterization.  Films were reviewed and discussed with Dr. Ellyn Hack.  Discussed with patient and wife.  He has a severe lesion in the distal LAD not suitable for PCI.  We'll plan medical treatment.  He also has what appears to be disease involving either a 20 diagonal and first septal perforator.  Not much disease in the right coronary artery or circumflex.  Proximal 50% LAD lesion not severe enough to need intervention.  We'll keep overnight in light of the elevated troponins and treatment with Plavix.  Add isosorbide mononitrate to regimen.  He will be seen by rehabilitation in the morning and if stable discharged home in the morning.  Kerry Hough MD [facility]

## 2014-12-13 NOTE — Progress Notes (Signed)
Subjective:  No chest pain overnight  Objective:  Vital Signs in the last 24 hours: BP 102/65 mmHg  Pulse 75  Temp(Src) 98.2 F (36.8 C) (Oral)  Resp 18  Ht 5' 7.5" (1.715 m)  Wt 93 kg (205 lb 0.4 oz)  BMI 31.62 kg/m2  SpO2 95%  Physical Exam: Pleasant WM in NAD Lungs:  Clear Cardiac:  Regular rhythm, normal S1 and S2, no S3  Intake/Output from previous day: 11/03 0701 - 11/04 0700 In: 240 [P.O.:240] Out: -   Weight Filed Weights   12/12/14 1659 12/13/14 0540  Weight: 93 kg (205 lb 0.4 oz) 93 kg (205 lb 0.4 oz)    Lab Results: Basic Metabolic Panel:  Recent Labs  12/12/14 1154 12/12/14 1721  NA 138  --   K 4.4  --   CL 104  --   CO2 26  --   GLUCOSE 118*  --   BUN 21*  --   CREATININE 1.11 1.01   CBC:  Recent Labs  12/12/14 1154 12/12/14 1721  WBC 10.5 8.3  NEUTROABS  --  5.4  HGB 15.3 14.9  HCT 45.3 44.8  MCV 89.7 89.6  PLT 337 320   Cardiac Enzymes: Troponin (Point of Care Test)  Recent Labs  12/12/14 1159  TROPIPOC 0.00   Cardiac Panel (last 3 results)  Recent Labs  12/12/14 1721 12/13/14 0011 12/13/14 0437  TROPONINI 0.03 0.15* 0.16*    Telemetry: sinus  Assessment/Plan:  1. Unstable angina/NSTEMI -  Troponins have gone mildly up overnight.  EKG is unchanged this am. Cath is planned later this am.  Received one dose of Lovenox at 1 mg/kg last pm.  2. Hyperlipidemia - agreed to take Lipitor now 3. BP stable  Plan: cath later and possible PCI     W. Doristine Church  MD [facility] Cardiology  12/13/2014, 8:51 AM

## 2014-12-13 NOTE — H&P (View-Only) (Signed)
Subjective:  No chest pain overnight  Objective:  Vital Signs in the last 24 hours: BP 102/65 mmHg  Pulse 75  Temp(Src) 98.2 F (36.8 C) (Oral)  Resp 18  Ht 5' 7.5" (1.715 m)  Wt 93 kg (205 lb 0.4 oz)  BMI 31.62 kg/m2  SpO2 95%  Physical Exam: Pleasant WM in NAD Lungs:  Clear Cardiac:  Regular rhythm, normal S1 and S2, no S3  Intake/Output from previous day: 11/03 0701 - 11/04 0700 In: 240 [P.O.:240] Out: -   Weight Filed Weights   12/12/14 1659 12/13/14 0540  Weight: 93 kg (205 lb 0.4 oz) 93 kg (205 lb 0.4 oz)    Lab Results: Basic Metabolic Panel:  Recent Labs  12/12/14 1154 12/12/14 1721  NA 138  --   K 4.4  --   CL 104  --   CO2 26  --   GLUCOSE 118*  --   BUN 21*  --   CREATININE 1.11 1.01   CBC:  Recent Labs  12/12/14 1154 12/12/14 1721  WBC 10.5 8.3  NEUTROABS  --  5.4  HGB 15.3 14.9  HCT 45.3 44.8  MCV 89.7 89.6  PLT 337 320   Cardiac Enzymes: Troponin (Point of Care Test)  Recent Labs  12/12/14 1159  TROPIPOC 0.00   Cardiac Panel (last 3 results)  Recent Labs  12/12/14 1721 12/13/14 0011 12/13/14 0437  TROPONINI 0.03 0.15* 0.16*    Telemetry: sinus  Assessment/Plan:  1. Unstable angina/NSTEMI -  Troponins have gone mildly up overnight.  EKG is unchanged this am. Cath is planned later this am.  Received one dose of Lovenox at 1 mg/kg last pm.  2. Hyperlipidemia - agreed to take Lipitor now 3. BP stable  Plan: cath later and possible PCI     W. Doristine Church  MD [facility] Cardiology  12/13/2014, 8:51 AM

## 2014-12-13 NOTE — Care Management Note (Addendum)
Case Management Note  Patient Details  Name: Charles Wright MRN: [id number] Date of Birth: 1935/08/23  Subjective/Objective: Pt admitted for chest pain. Plan for cardiac cath 12-13-14.                    Action/Plan: CM to assist with disposition needs.    Expected Discharge Date:                Expected Discharge Plan:  Home/Self Care  In-House Referral:  NA  Discharge planning Services  CM Consult  Post Acute Care Choice:    Choice offered to:     DME Arranged:    DME Agency:     HH Arranged:    HH Agency:     Status of Service:  In process, will continue to follow  Medicare Important Message Given:    Date Medicare IM Given:    Medicare IM give by:    Date Additional Medicare IM Given:    Additional Medicare Important Message give by:     If discussed at [place] of Stay Meetings, dates discussed:    Additional Comments:  Bethena Roys, RN 12/13/2014, 2:41 PM

## 2014-12-14 ENCOUNTER — Encounter (HOSPITAL_COMMUNITY): Payer: Self-pay | Admitting: Physician Assistant

## 2014-12-14 DIAGNOSIS — I10 Essential (primary) hypertension: Secondary | ICD-10-CM

## 2014-12-14 DIAGNOSIS — I4892 Unspecified atrial flutter: Secondary | ICD-10-CM

## 2014-12-14 DIAGNOSIS — E785 Hyperlipidemia, unspecified: Secondary | ICD-10-CM

## 2014-12-14 DIAGNOSIS — I2510 Atherosclerotic heart disease of native coronary artery without angina pectoris: Secondary | ICD-10-CM

## 2014-12-14 DIAGNOSIS — I214 Non-ST elevation (NSTEMI) myocardial infarction: Secondary | ICD-10-CM

## 2014-12-14 LAB — BASIC METABOLIC PANEL
Anion gap: 6 (ref 5–15)
BUN: 14 mg/dL (ref 6–20)
CALCIUM: 8.9 mg/dL (ref 8.9–10.3)
CO2: 25 mmol/L (ref 22–32)
CREATININE: 0.96 mg/dL (ref 0.61–1.24)
Chloride: 107 mmol/L (ref 101–111)
Glucose, Bld: 107 mg/dL — ABNORMAL HIGH (ref 65–99)
Potassium: 4.5 mmol/L (ref 3.5–5.1)
Sodium: 138 mmol/L (ref 135–145)

## 2014-12-14 LAB — HEPATIC FUNCTION PANEL
ALBUMIN: 3.1 g/dL — AB (ref 3.5–5.0)
ALT: 24 U/L (ref 17–63)
AST: 26 U/L (ref 15–41)
Alkaline Phosphatase: 53 U/L (ref 38–126)
Total Bilirubin: 0.6 mg/dL (ref 0.3–1.2)
Total Protein: 5.4 g/dL — ABNORMAL LOW (ref 6.5–8.1)

## 2014-12-14 LAB — CBC
HEMATOCRIT: 39.8 % (ref 39.0–52.0)
HEMOGLOBIN: 13.3 g/dL (ref 13.0–17.0)
MCH: 30.1 pg (ref 26.0–34.0)
MCHC: 33.4 g/dL (ref 30.0–36.0)
MCV: 90 fL (ref 78.0–100.0)
Platelets: 289 10*3/uL (ref 150–400)
RBC: 4.42 MIL/uL (ref 4.22–5.81)
RDW: 14.4 % (ref 11.5–15.5)
WBC: 8.5 10*3/uL (ref 4.0–10.5)

## 2014-12-14 MED ORDER — ASPIRIN 81 MG PO TBEC
81.0000 mg | DELAYED_RELEASE_TABLET | Freq: Every day | ORAL | Status: DC
Start: 2014-12-14 — End: 2018-10-10

## 2014-12-14 MED ORDER — PRAVASTATIN SODIUM 40 MG PO TABS: 40.0000 mg | ORAL_TABLET | Freq: Every day | ORAL | Status: DC

## 2014-12-14 MED ORDER — CLOPIDOGREL BISULFATE 75 MG PO TABS: 75.0000 mg | ORAL_TABLET | Freq: Every day | ORAL | Status: DC

## 2014-12-14 MED ORDER — ISOSORBIDE MONONITRATE ER 30 MG PO TB24: 30.0000 mg | ORAL_TABLET | Freq: Every day | ORAL | Status: DC

## 2014-12-14 MED ORDER — NITROGLYCERIN 0.4 MG SL SUBL: 0.4000 mg | SUBLINGUAL_TABLET | SUBLINGUAL | Status: AC | PRN

## 2014-12-14 NOTE — Progress Notes (Signed)
The patient's nurse paged me to inquire about amiodarone rx. Dr. Wynonia Lawman finished this patient's discharge this morning including med rec. I had discussed amio with him - he said he did not send in rx yet because he wanted to make sure patient had adequate flecainide washout before the rx hit the pharmacy. He said his office will take care of this early next week. The nurse verbalized understanding. Socrates Cahoon PA-C

## 2014-12-14 NOTE — Progress Notes (Signed)
CARDIAC REHAB PHASE I   PRE:  Rate/Rhythm: 87 SR    BP: sitting 121/70    SaO2:   MODE:  Ambulation: 550 ft   POST:  Rate/Rhythm: 87 SR    BP: sitting 129/63     SaO2:   Tolerated very well. Ed completed with good reception. Interested in [facility] and will send referral to [place].   [place], [place], ACSM 12/14/2014 9:50 AM

## 2014-12-14 NOTE — Discharge Instructions (Signed)
Acute Coronary Syndrome °Acute coronary syndrome (ACS) is a serious problem in which there is suddenly not enough blood and oxygen supplied to the heart. ACS may mean that one or more of the blood vessels in your heart (coronary arteries) may be blocked. ACS can result in chest pain or a heart attack (myocardial infarction or MI). °CAUSES °This condition is caused by atherosclerosis, which is the buildup of fat and cholesterol (plaque) on the inside of the arteries. Over time, the plaque may narrow or block the artery, and this will lessen blood flow to the heart. Plaque can also become weak and break off within a coronary artery to form a clot and cause a sudden blockage. °RISK FACTORS °The risks factors of this condition include: °· High cholesterol levels. °· High blood pressure (hypertension). °· Smoking. °· Diabetes. °· Age. °· Family history of chest pain, heart disease, or stroke. °· Lack of exercise. °SYMPTOMS °The most common signs of this condition include: °· Chest pain, which can be: °¨ A crushing or squeezing in the chest. °¨ A tightness, pressure, fullness, or heaviness in the chest. °¨ Present for more than a few minutes, or it can stop and recur. °· Pain in the arms, neck, jaw, or back. °· Unexplained heartburn or indigestion. °· Shortness of breath. °· Nausea. °· Sudden cold sweats. °· Feeling light-headed or dizzy. °Sometimes, this condition has no symptoms. °DIAGNOSIS °ACS may be diagnosed through the following tests: °· Electrocardiogram (ECG). °· Blood tests. °· Coronary angiogram. This is a procedure to look at the coronary arteries to see if there is any blockage. °TREATMENT °Treatment for ACS may include: °· Healthy behavioral changes to reduce or control risk factors. °· Medicine. °· Coronary stenting. A stent helps to keep an artery open. °· Coronary angioplasty. This procedure widens a narrowed or blocked artery. °· Coronary artery bypass surgery. This will allow your blood to pass the  blockage (bypass) to reach your heart. °HOME CARE INSTRUCTIONS °Eating and Drinking °· Follow a heart-healthy diet. A dietitian can you help to educate you about healthy food options and changes. °· Use healthy cooking methods such as roasting, grilling, broiling, baking, poaching, steaming, or stir-frying. Talk to a dietitian to learn more about healthy cooking methods. °Medicines °· Take medicines only as directed by your health care provider. °· Do not take the following medicines unless your health care provider approves: °¨ Nonsteroidal anti-inflammatory drugs (NSAIDs), such as ibuprofen, naproxen, or celecoxib. °¨ Vitamin supplements that contain vitamin A, vitamin E, or both. °¨ Hormone replacement therapy that contains estrogen with or without progestin. °· Stop illegal drug use. °Activities °· Follow an exercise program that is approved by your health care provider. °· Plan rest periods when you are fatigued. °Lifestyle °· Do not use any tobacco products, including cigarettes, chewing tobacco, or electronic cigarettes. If you need help quitting, ask your health care provider. °· If you drink alcohol, and your health care provider approves, limit your alcohol intake to no more than 1 drink per day. One drink equals 12 ounces of beer, 5 ounces of wine, or 1½ ounces of hard liquor. °· Learn to manage stress. °· Maintain a healthy weight. Lose weight as approved by your health care provider. °General Instructions °· Manage other health conditions, such as hypertension and diabetes, as directed by your health care provider. °· Keep all follow-up visits as directed by your health care provider. This is important. °· Your health care provider may ask you to monitor your blood   pressure. A blood pressure reading consists of a higher number over a lower number, such as 110 over 72, written as 110/72. Ideally, your blood pressure should be:  Below 140/90 if you have no other medical conditions.  Below 130/80 if  you have diabetes or kidney disease. SEEK IMMEDIATE MEDICAL CARE IF:  You have pain in your chest, neck, arm, jaw, stomach, or back that lasts more than a few minutes, is recurring, or is not relieved by taking medicine under your tongue (sublingual nitroglycerin).  You have profuse sweating without cause.  You have unexplained:  Heartburn or indigestion.  Shortness of breath or difficulty breathing.  Nausea or vomiting.  Fatigue.  Feelings of nervousness or anxiety.  Weakness.  Diarrhea.  You have sudden light-headedness or dizziness.  You faint. These symptoms may represent a serious problem that is an emergency. Do not wait to see if the symptoms will go away. Get medical help right away. Call your local emergency services (911 in the [place] Do not drive yourself to the clinic or hospital.   This information is not intended to replace advice given to you by your health care provider. Make sure you discuss any questions you have with your health care provider.   Document Released: 01/25/2005 Document Revised: 02/15/2014 Document Reviewed: 05/29/2013 Elsevier Interactive Patient Education 2016 [place].  Heart Attack A heart attack (myocardial infarction, MI) causes damage to your heart that cannot be fixed. A heart attack can happen when a heart (coronary) artery becomes blocked or narrowed. This cuts off the blood supply and oxygen to your heart. When one or more of your coronary arteries becomes blocked, that area of your heart begins to die. This causes the pain you feel during a heart attack. Heart attack pain can also occur in one part of the body but be felt in another part of the body (referred pain). You may feel referred heart attack pain in your left arm, neck, or jaw. Pain may even be felt in the right arm. CAUSES  Many conditions can cause a heart attack. These include:   Atherosclerosis. This is when a fatty substance (plaque) gradually builds up in the  arteries. This buildup can block or reduce the blood supply to one or more of the heart arteries.  A blood clot. A blood clot can develop suddenly when plaque breaks up (ruptures) within a heart artery. A blood clot can block the heart artery, which prevents blood flow to the heart.   Severe tightening (spasm) of the heart artery. This cuts off blood flow through the artery.  RISK FACTORS People at risk for heart attack usually have one or more of the following risk factors:   High blood pressure (hypertension).  High cholesterol.  Smoking.  Being male.  Being overweight or obese.  Older aged.   A family history of heart disease.  Lack of exercise.  Diabetes.  Stress.  Drinking too much alcohol.  Using illegal street drugs, such as cocaine and methamphetamines. SYMPTOMS  Heart attack symptoms can vary from person to person. Symptoms depend on factors like gender and age.   In both men and women, heart attack symptoms can include the following:   Chest pain. This may feel like crushing, squeezing, or a feeling of pressure.  Shortness of breath.  Heartburn or indigestion with or without vomiting, shortness of breath, or sweating.  Sudden cold sweats.  Sudden light-headedness.  Upper back pain.   Women can have unique heart attack symptoms,  such as:   Unexplained feelings of nervousness or anxiety.  Discomfort between the shoulder blades or upper back.  Tingling in the hands and arms.   Older people (of both genders) can have subtle heart attack symptoms, such as:   Sweating.  Shortness of breath.  General tiredness or not feeling well.  DIAGNOSIS  Diagnosing a heart attack involves several tests. They include:   An assessment of your vital signs. This includes checking your:  Heart rhythm.  Blood pressure.  Breathing rate.  Oxygen level.   An ECG (electrocardiogram) to measure the electrical activity of your heart.  Blood tests  called cardiac markers. In these tests, blood is drawn at scheduled times to check for the specific proteins or enzymes released by damaged heart muscle.  A chest X-ray.  An echocardiogram to evaluate heart motion and blood flow.  Coronary angiography to look at the heart arteries.  TREATMENT  Treatment for a heart attack may include:   Medicine that breaks up or dissolves blood clots in the heart artery.  Angioplasty.  Cardiac stent placement.  Intra-aortic balloon pump therapy (IABP).  Open heart surgery (coronary artery bypass graft, CABG). HOME CARE INSTRUCTIONS  Take medicines only as directed by your health care provider. You may need to take medicine after a heart attack to:   Keep your blood from clotting too easily.  Control your blood pressure.  Lower your cholesterol.  Control abnormal heart rhythms.   Do not take the following medicines unless your health care provider approves:  Nonsteroidal anti-inflammatory drugs (NSAIDs), such as ibuprofen, naproxen, or celecoxib.  Vitamin supplements that contain vitamin A, vitamin E, or both.  Hormone replacement therapy that contains estrogen with or without progestin.  Make lifestyle changes as directed by your health care provider. These may include:   Quitting smoking, if you smoke.  Getting regular exercise. Ask your health care provider to suggest some activities that are safe for you.  Eating a heart-healthy diet. A registered dietitian can help you learn healthy eating options.  Maintaining a healthy weight.   Managing diabetes, if necessary.  Reducing stress.  Limiting how much alcohol you drink. SEEK IMMEDIATE MEDICAL CARE IF:   You have sudden, unexplained chest discomfort.  You have sudden, unexplained discomfort in your arms, back, neck, or jaw.  You have shortness of breath at any time.  You suddenly start to sweat or your skin gets clammy.  You feel nauseous or vomit.  You  suddenly feel light-headed or dizzy.  Your heart begins to beat fast or feels like it is skipping beats. These symptoms may represent a serious problem that is an emergency. Do not wait to see if the symptoms will go away. Get medical help right away. Call your local emergency services (911 in the [place] Do not drive yourself to the hospital.   This information is not intended to replace advice given to you by your health care provider. Make sure you discuss any questions you have with your health care provider.   Document Released: 01/25/2005 Document Revised: 02/15/2014 Document Reviewed: 03/30/2013 Elsevier Interactive Patient Education [employer].

## 2014-12-14 NOTE — Progress Notes (Signed)
Patient: Charles Wright / Admit Date: 12/12/2014 / Date of Encounter: 12/14/2014, 7:52 AM   Subjective: Feels great. No CP or SOB.   Objective: Telemetry: NSR, one PAC Physical Exam: Blood pressure 95/62, pulse 70, temperature 98.6 F (37 C), temperature source Oral, resp. rate 15, height 5' 7.5" (1.715 m), weight 207 lb 3.2 oz (93.985 kg), SpO2 100 %. General: Well developed, well nourished WM, in no acute distress. Head: Normocephalic, atraumatic, sclera non-icteric, no xanthomas, nares are without discharge. Neck: Negative for carotid bruits. JVP not elevated. Lungs: Clear bilaterally to auscultation without wheezes, rales, or rhonchi. Breathing is unlabored. Heart: RRR S1 S2 without murmurs, rubs, or gallops.  Abdomen: Soft, non-tender, non-distended with normoactive bowel sounds. No rebound/guarding. Extremities: No clubbing or cyanosis. No edema. Distal pedal pulses are 2+ and equal bilaterally. RIght radial cath site without hematoma or ecchymosis; good pulse. Neuro: Alert and oriented X 3. Moves all extremities spontaneously. Psych:  Responds to questions appropriately with a normal affect.  No intake or output data in the 24 hours ending 12/14/14 0752  Inpatient Medications:  . allopurinol  300 mg Oral q morning - 10a  . aspirin EC  81 mg Oral Daily  . atorvastatin  40 mg Oral q1800  . clopidogrel  75 mg Oral Q breakfast  . hydrOXYzine  25 mg Oral QHS  . isosorbide mononitrate  30 mg Oral Daily  . metoprolol  50 mg Oral BID  . sodium chloride  3 mL Intravenous Q12H   Infusions:    Labs:  Recent Labs  12/12/14 1154 12/12/14 1721 12/14/14 0237  NA 138  --  138  K 4.4  --  4.5  CL 104  --  107  CO2 26  --  25  GLUCOSE 118*  --  107*  BUN 21*  --  14  CREATININE 1.11 1.01 0.96  CALCIUM 9.6  --  8.9   No results for input(s): AST, ALT, ALKPHOS, BILITOT, PROT, ALBUMIN in the last 72 hours.  Recent Labs  12/12/14 1721 12/14/14 0237  WBC 8.3 8.5  NEUTROABS 5.4   --   HGB 14.9 13.3  HCT 44.8 39.8  MCV 89.6 90.0  PLT 320 289    Recent Labs  12/12/14 1721 12/13/14 0011 12/13/14 0437  TROPONINI 0.03 0.15* 0.16*   Invalid input(s): POCBNP No results for input(s): HGBA1C in the last 72 hours.   Radiology/Studies:  Dg Chest 2 View  12/12/2014  CLINICAL DATA:  Chest pain left side radiating to left shoulder. Hypertension. EXAM: CHEST  2 VIEW COMPARISON:  January 16, 2013 FINDINGS: There is scarring in the bases, slightly more on the left than on the right. No edema or consolidation. Heart size and pulmonary vascularity are normal. No adenopathy. There is degenerative change in the upper lumbar region with slight anterior wedging of an upper lumbar vertebral body, stable. IMPRESSION: Areas of scarring in the bases, slightly more on the left than on the right, stable. No edema or consolidation. Electronically Signed   By: Lowella Grip III M.D.   On: 12/12/2014 11:59     Assessment and Plan  1. Chest pain/small NSTEMI with newly diagnosed CAD - LHC 12/13/14: 99% dLAD-2 (not amenable to PCI), 50% dLAD-1, 60% mLAD; medical therapy recommended. Started on Plavix and Imdur per Dr. Wynonia Lawman. Cardiac rehab to see this AM.  2. History of PAF/paroxysmal atrial flutter - not on anticoagulation due to h/o hematuria. We also now must stop flecainide due to  his CAD. Will review with MD.  3. Essential HTN - BP tends to run softer. I will hold irbesartan for now to allow BP room to continue his anti-anginals.  4. Hyperlipidemia - patient has agreed to take Lipitor. Will add baseline LFTs.   5. Bladder cancer s/p resection, BCG treatment h/o hematuria - observe for recurrent bleeding on DAPT.   Signed, Melina Copa PA-C Pager: [phone]

## 2014-12-14 NOTE — Discharge Summary (Addendum)
Discharge Summary   Patient ID: Charles Wright,  MRN: [id number], DOB/AGE: 77-Mar-1937 77 y.o.  Admit date: 12/12/2014 Discharge date: 12/14/2014  Primary Care Provider: Vena Wright Primary Cardiologist: Charles Wright  Discharge Diagnoses    Principal Problem:   NSTEMI (non-ST elevated myocardial infarction) [facility]) Active Problems:   Paroxysmal atrial fibrillation ([place])   Hypertensive heart disease   Obesity (BMI 30-39.9)   Bladder cancer (HCC)   Paroxysmal atrial flutter (H[facility])   Essential hypertension   Hyperlipidemia   CAD in native artery   Allergies Allergies  Allergen Reactions  . Bee Venom Anaphylaxis    Actually Hornets and Wasps.   . Losartan Other (See Comments)    Cough   . Oxycodone Other (See Comments)    ALTERED MENTAL STATIS    Diagnostic Studies/Procedures   Cardiac catheterization this admission, please see full report and above for summary. _____________   Hospital Course   Charles Wright is a 79 y/o M with history of paroxysmal atrial flutter and fibrillation, HTN, HLD, Gilbert's syndrome, GERD, OSA, bladder CA, prostate CA and BPH who was admitted with chest pain concerning for angina and subsequently diagnosed with CAD this admission.  Per review of history, Charles Wright saw him in April of this year - the patient was anticoagulated and later placed on flecainide which has resulted in suppression of his atrial arrhythmias.He has had difficulty with bladder cancer underwent resection and later BCG treatment.He has had urinary bleeding and his anticoagulation had to be stopped.(CHADSVASC now 4). He eventually was seen by the infectious disease doctors and for a while was on rifampin but as of 2 weeks ago his urinary frequency and retention increased and his hydroxyzine was increased.Since then he has felt much better and he had gotten a call from ID saying he no longer needed to use the rifampin. He had a treadmill test in July on flecainide and  failed to reach his target heart rate but had no changes.  Earlier this week, the patient was working outside when he had an episode of midsternal chest pain with radiation to his neck that lasted around 30 minutes. The day of admission he had a similar discomfort while changing a light fixture, described as a pressure radiating to his neck and lasting 45 minutes, associated with mild diaphoresis. EKG showed NSR without acute ST abnormality. He was placed on Lovenox and admitted for symptoms concerning for [place]. He did eventually rule in with mild elevation in troponin up to peak 0.16. LDL was 137 - the patient previously refused statin therapy but he agreed to be started on atorvastatin this admission.   He underwent LHC 12/13/14 demonstrating 99% dLAD-2 (not amenable to PCI), 50% dLAD-1, 60% mLAD. Medical therapy was recommended.  LV gram not able to be performed.  Per cath note, "If he were to fail medical management, we could consider potentially evaluation and treatment of the more proximal of the mid LAD lesions. However I would recommend femoral approach based on the difficulty with catheter placement and concern for spasm from a radial approach." Plavix and Imdur were added to his regimen.  He had no recurrent chest pain.  His ARB was held because of a mildly low blood pressure.  He does have paroxysmal atrial fibrillation and has had difficulty tolerating anticoagulation.  He has currently been off of Charles Wright.  He will begin aspirin and Plavix and will be considered for referral for a watchman device as he will likely need to have  recurrent bladder procedures and has had urinary bleeding already.  He had good be an ideal candidate for a watchman device and would like to avoid systemic anticoagulation in light of his urologic problems.  His flecainide will be discontinued and after it washes out he will be placed on amiodarone 200 mg daily for suppression.   He was also advised to f/u PCP for  monitoring of hyperglycemia as CBG was mildly elevated this admission.  ____________  Discharge Vitals Blood pressure 102/62, pulse 73, temperature 98.6 F (37 C), temperature source Oral, resp. rate 15, height 5' 7.5" (1.715 m), weight 93.985 kg (207 lb 3.2 oz), SpO2 100 %.  Filed Weights   12/12/14 1659 12/13/14 0540 12/14/14 0424  Weight: 93 kg (205 lb 0.4 oz) 93 kg (205 lb 0.4 oz) 93.985 kg (207 lb 3.2 oz)   radial cath site clean and dry  _____________  Labs     CBC  Recent Labs  12/12/14 1721 12/14/14 0237  WBC 8.3 8.5  NEUTROABS 5.4  --   HGB 14.9 13.3  HCT 44.8 39.8  MCV 89.6 90.0  PLT 320 253   Basic Metabolic Panel  Recent Labs  12/12/14 1154 12/12/14 1721 12/14/14 0237  NA 138  --  138  K 4.4  --  4.5  CL 104  --  107  CO2 26  --  25  GLUCOSE 118*  --  107*  BUN 21*  --  14  CREATININE 1.11 1.01 0.96  CALCIUM 9.6  --  8.9   Cardiac Enzymes  Recent Labs  12/12/14 1721 12/13/14 0011 12/13/14 0437  TROPONINI 0.03 0.15* 0.16*   Fasting Lipid Panel  Recent Labs  12/12/14 1721  CHOL 200  HDL 45  LDLCALC 137*  TRIG 88  CHOLHDL 4.4   Thyroid Function Tests  Recent Labs  12/12/14 1721  TSH 3.195   _____________  Disposition   Pt is being discharged home today in good condition. _____________  Follow-up Plans & Appointments   Follow-up Information    Follow up with Charles Austria, MD.   Specialty:  Family Medicine   Why:  Your blood sugar was mildly elevated. Please follow up with PCP to monitor.   Contact information:   [address] [address] [phone]       Follow up with Charles Standing, MD. Schedule an appointment as soon as possible for a visit in 1 week.   Specialty:  Cardiology   Contact information:   [address] [place] [address] [phone]      follow up Charles Wright in one week.   Discharge Instructions    Amb Referral to Cardiac  Rehabilitation    Complete by:  As directed   Diagnosis:  Myocardial Infarction     Diet - low sodium heart healthy    Complete by:  As directed      Discharge instructions    Complete by:  As directed   Stop taking flecainide.  Call if develops a lot of recurrent chest pain.  On Tuesday begin amiodarone 200 mg daily.     Increase activity slowly    Complete by:  As directed            Discharge Medications   Current Discharge Medication List    START taking these medications   Details  aspirin EC 81 MG EC tablet Take 1 tablet (81 mg total) by mouth daily.  clopidogrel (PLAVIX) 75 MG tablet Take 1 tablet (75 mg total) by mouth daily with breakfast. Qty: 30 tablet, Refills: 12    isosorbide mononitrate (IMDUR) 30 MG 24 hr tablet Take 1 tablet (30 mg total) by mouth daily. Qty: 30 tablet, Refills: 12    nitroGLYCERIN (NITROSTAT) 0.4 MG SL tablet Place 1 tablet (0.4 mg total) under the tongue every 5 (five) minutes x 3 doses as needed for chest pain. Qty: 25 tablet, Refills: 12    pravastatin (PRAVACHOL) 40 MG tablet Take 1 tablet (40 mg total) by mouth daily. Qty: 30 tablet      CONTINUE these medications which have NOT CHANGED   Details  allopurinol (ZYLOPRIM) 300 MG tablet Take 300 mg by mouth every morning.     Cholecalciferol (VITAMIN D-3) 1000 UNITS CAPS Take 1,000 Units by mouth 2 (two) times daily.     EPINEPHrine 0.3 mg/0.3 mL IJ SOAJ injection Inject 0.3 mLs (0.3 mg total) into the muscle once. Qty: 2 Device, Refills: 1    Glucosamine Sulfate (DONA PO) Take 1 tablet by mouth 2 (two) times daily.     hydrOXYzine (ATARAX/VISTARIL) 25 MG tablet Take 1 tablet (25 mg total) by mouth at bedtime. Qty: 30 tablet, Refills: 0    metoprolol (LOPRESSOR) 50 MG tablet Take 50 mg by mouth 2 (two) times daily.    Multiple Vitamin (MULTIVITAMIN) tablet Take 1 tablet by mouth 2 (two) times daily.     valsartan (DIOVAN) 160 MG tablet Take 160 mg by mouth every morning.         STOP taking these medications     flecainide (TAMBOCOR) 50 MG tablet      influenza vac recombinant HA trivalent (FLUBLOK) injection      rifampin (RIFADIN) 300 MG capsule          Outstanding Labs/Studies   See above re: f/u lipids/LFTs  Duration of Discharge Encounter   Greater than 30 minutes including physician time.  Signed, Melina Copa PA-C 12/14/2014, 10:29 AM  Above note read and edited by Dr. Lonna Duval MD [facility]

## 2014-12-14 NOTE — Discharge Summary (Deleted)
Discharge Summary   Patient ID: Charles Wright,  MRN: [id number], DOB/AGE: 1936/02/07 79 y.o.  Admit date: 12/12/2014 Discharge date: 12/14/2014  Primary Care Provider: Vena Austria Primary Cardiologist: Dr. Wynonia Lawman  Discharge Diagnoses    Principal Problem:   NSTEMI (non-ST elevated myocardial infarction) [facility]) Active Problems:   CAD in native artery   Paroxysmal atrial fibrillation ([place])   Hypertensive heart disease   Obesity (BMI 30-39.9)   Bladder cancer (HCC)   Paroxysmal atrial flutter (HCC)   Essential hypertension   Hyperlipidemia   Allergies Allergies  Allergen Reactions  . Bee Venom Anaphylaxis    Actually Hornets and Wasps.   . Losartan Other (See Comments)    Cough   . Oxycodone Other (See Comments)    ALTERED MENTAL STATIS    Diagnostic Studies/Procedures   Cardiac catheterization this admission, please see full report and above for summary. _____________   Hospital Course   [name] is a 79 y/o M with history of paroxysmal atrial flutter and fibrillation, HTN, HLD, Gilbert's syndrome, GERD, OSA, bladder CA, prostate CA and BPH who was admitted with chest pain concerning for angina and subsequently diagnosed with CAD this admission.  Per review of history, Dr. Wynonia Lawman saw him in April of this year - the patient was anticoagulated and later placed on flecainide which has resulted in suppression of his atrial arrhythmias.He has had difficulty with bladder cancer underwent resection and later BCG treatment.He has had urinary bleeding and his anticoagulation had to be stopped.(CHADSVASC now 4). He eventually was seen by the infectious disease doctors and for a while was on rifampin but as of 2 weeks ago his urinary frequency and retention increased and his hydroxyzine was increased.Since then he has felt much better and he had gotten a call from ID saying he no longer needed to use the rifampin. He had a treadmill test in July on flecainide and  failed to reach his target heart rate but had no changes.  Earlier this week, the patient was working outside when he had an episode of midsternal chest pain with radiation to his neck that lasted around 30 minutes. The day of admission he had a similar discomfort while changing a light fixture, described as a pressure radiating to his neck and lasting 45 minutes, associated with mild diaphoresis. EKG showed NSR without acute ST abnormality. He was placed on Lovenox and admitted for symptoms concerning for [place]. He did eventually rule in with mild elevation in troponin up to peak 0.16. LDL was 137 - the patient previously refused statin therapy but he agreed to be started on atorvastatin this admission.   He underwent LHC 12/13/14 demonstrating 99% dLAD-2 (not amenable to PCI), 50% dLAD-1, 60% mLAD. Medical therapy was recommended.  LV gram not able to be performed.  Per cath note, "If he were to fail medical management, we could consider potentially evaluation and treatment of the more proximal of the mid LAD lesions. However I would recommend femoral approach based on the difficulty with catheter placement and concern for spasm from a radial approach." Plavix and Imdur were added to his regimen. Due to BP tending to run on the lower side,  The patient tolerated cath well. He feels fine this morning. Dr. Wynonia Lawman has seen and examined the patient today and feels he is stable for discharge. He will be seen by cardiac rehab prior to discharge and offered cardiac rehab phase II.   He does have paroxysmal atrial fibrillation and has had  difficulty tolerating anticoagulation.  He has currently been off of Eliquus.  He will begin aspirin and Plavix and will be considered for referral for a watchman device as he will likely need to have recurrent bladder procedures and has had urinary bleeding already.  He had good be an ideal candidate for a watchman device and would like to avoid systemic anticoagulation in light of  his urologic problems.  His flecainide will be discontinued and after it washes out he will be placed on amiodarone 200 mg daily for suppression.    He was also advised to f/u PCP for monitoring of hyperglycemia as CBG was mildly elevated this admission.  ____________  Discharge Vitals Blood pressure 95/62, pulse 70, temperature 98.6 F (37 C), temperature source Oral, resp. rate 15, height 5' 7.5" (1.715 m), weight 207 lb 3.2 oz (93.985 kg), SpO2 100 %.  Filed Weights   12/12/14 1659 12/13/14 0540 12/14/14 0424  Weight: 205 lb 0.4 oz (93 kg) 205 lb 0.4 oz (93 kg) 207 lb 3.2 oz (93.985 kg)   radial cath site clean and dry  _____________  Labs     CBC  Recent Labs  12/12/14 1721 12/14/14 0237  WBC 8.3 8.5  NEUTROABS 5.4  --   HGB 14.9 13.3  HCT 44.8 39.8  MCV 89.6 90.0  PLT 320 656   Basic Metabolic Panel  Recent Labs  12/12/14 1154 12/12/14 1721 12/14/14 0237  NA 138  --  138  K 4.4  --  4.5  CL 104  --  107  CO2 26  --  25  GLUCOSE 118*  --  107*  BUN 21*  --  14  CREATININE 1.11 1.01 0.96  CALCIUM 9.6  --  8.9   Cardiac Enzymes  Recent Labs  12/12/14 1721 12/13/14 0011 12/13/14 0437  TROPONINI 0.03 0.15* 0.16*   Fasting Lipid Panel  Recent Labs  12/12/14 1721  CHOL 200  HDL 45  LDLCALC 137*  TRIG 88  CHOLHDL 4.4   Thyroid Function Tests  Recent Labs  12/12/14 1721  TSH 3.195   _____________  Disposition   Pt is being discharged home today in good condition. _____________  Follow-up Plans & Appointments   Follow-up Information    Follow up with Vena Austria, MD.   Specialty:  Family Medicine   Why:  Your blood sugar was mildly elevated. Please follow up with PCP to monitor.   Contact information:   [place] [address] [phone]      follow up Dr. Wynonia Lawman in one week.     Discharge Medications   Current Discharge Medication List    START taking these medications    Details  aspirin EC 81 MG EC tablet Take 1 tablet (81 mg total) by mouth daily.    clopidogrel (PLAVIX) 75 MG tablet Take 1 tablet (75 mg total) by mouth daily with breakfast. Qty: 30 tablet, Refills: 12    isosorbide mononitrate (IMDUR) 30 MG 24 hr tablet Take 1 tablet (30 mg total) by mouth daily. Qty: 30 tablet, Refills: 12    nitroGLYCERIN (NITROSTAT) 0.4 MG SL tablet Place 1 tablet (0.4 mg total) under the tongue every 5 (five) minutes x 3 doses as needed for chest pain. Qty: 25 tablet, Refills: 12    pravastatin (PRAVACHOL) 40 MG tablet Take 1 tablet (40 mg total) by mouth daily. Qty: 30 tablet      CONTINUE these medications which have NOT CHANGED  Details  allopurinol (ZYLOPRIM) 300 MG tablet Take 300 mg by mouth every morning.     Cholecalciferol (VITAMIN D-3) 1000 UNITS CAPS Take 1,000 Units by mouth 2 (two) times daily.     EPINEPHrine 0.3 mg/0.3 mL IJ SOAJ injection Inject 0.3 mLs (0.3 mg total) into the muscle once. Qty: 2 Device, Refills: 1    Glucosamine Sulfate (DONA PO) Take 1 tablet by mouth 2 (two) times daily.     hydrOXYzine (ATARAX/VISTARIL) 25 MG tablet Take 1 tablet (25 mg total) by mouth at bedtime. Qty: 30 tablet, Refills: 0    metoprolol (LOPRESSOR) 50 MG tablet Take 50 mg by mouth 2 (two) times daily.    Multiple Vitamin (MULTIVITAMIN) tablet Take 1 tablet by mouth 2 (two) times daily.     valsartan (DIOVAN) 160 MG tablet Take 160 mg by mouth every morning.       STOP taking these medications     flecainide (TAMBOCOR) 50 MG tablet      influenza vac recombinant HA trivalent (FLUBLOK) injection      rifampin (RIFADIN) 300 MG capsule          Aspirin prescribed at discharge:  Yes High Intensity Statin Prescribed? (Lipitor 40-80mg  or Crestor 20-40mg ): Yes Beta Blocker Prescribed: Yes ADP Receptor Inhibitor Prescribed? (i.e. Plavix etc.-Includes Medically Managed Patients): Yes Was Cardiac Rehab II ordered? (Included Medically managed  Patients): Yes - cardiac rehab to see prior to discharge and will offer order for phase II rehab _____________   Outstanding Labs/Studies   See above re: f/u lipids/LFTs  Duration of Discharge Encounter   Greater than 30 minutes including physician time.  Signed, Lisbeth Renshaw Dunn PA-C 12/14/2014, 8:15 AM

## 2014-12-16 ENCOUNTER — Ambulatory Visit: Payer: Medicare Other | Admitting: Internal Medicine

## 2014-12-16 ENCOUNTER — Encounter (HOSPITAL_COMMUNITY): Payer: Self-pay | Admitting: Cardiology

## 2014-12-20 ENCOUNTER — Encounter: Payer: Self-pay | Admitting: Cardiology

## 2014-12-20 ENCOUNTER — Telehealth: Payer: Self-pay | Admitting: Physician Assistant

## 2014-12-20 NOTE — Telephone Encounter (Signed)
Paged by answering service, Pharmacy needs medication review. Pharmacy want to clarify to start new rx of amiodarone and stop flecainide.   Per discharge summery 12/14/14 "His flecainide will be discontinued and after it washes out he will be placed on amiodarone 200 mg daily for suppression."  Advised pharmacy to start amio and stop flecainide.   Danicka Hourihan, [place]

## 2014-12-23 NOTE — Progress Notes (Signed)
Patient ID: Charles Wright, male   DOB: [birth year], 79 y.o.   MRN: [id number]  Charles Wright    Date of visit:  12/20/2014 DOB:  Oct 29, 1935    Age:  79 yrs. Medical record number:  [id number]     Account number:  [id number] Primary Care Provider: Maury Wright ____________________________ CURRENT DIAGNOSES  1. CAD Native without angina  2. Paroxysmal atrial fibrillation  3. Sleep apnea  4. Essential hypertension  5. Obesity ____________________________ ALLERGIES  Lisinopril, Intolerance-cough  Losartan, Intolerance-cough  Oxycodone, Mood swings ____________________________ MEDICATIONS  1. multivitamin tablet, 1 p.o. daily  2. Vitamin D3 1,000 unit tablet, 1 p.o. daily  3. Glucosamine 1500 Complex 500 mg-400 mg capsule, 1 p.o. daily  4. ibuprofen 200 mg tablet, PRN  5. EpiPen Jr 2-Pak 0.15 mg/0.3 mL injection,auto-injector, PRN  6. colchicine 0.6 mg tablet, 1 p.o. daily  7. metoprolol tartrate 50 mg tablet, BID  8. isosorbide mononitrate ER 30 mg tablet,extended release 24 hr, 1 p.o. daily  9. amiodarone 200 mg tablet, 1 p.o. daily  10. nitroglycerin 0.4 mg sublingual tablet, prn  11. clopidogrel 75 mg tablet, 1 p.o. daily  12. aspirin 81 mg tablet,delayed release, 1 p.o. daily  13. hydroxyzine HCl 25 mg tablet, QHS  14. allopurinol 300 mg tablet, 1 p.o. daily ____________________________ HISTORY OF PRESENT ILLNESS Patient seen for cardiac evaluation. Since he was previously here he has had a complicated course. He was treated with BCG and there was some question about severe bladder irritation and eventually rifampin was stopped. He was hospitalized with prolonged chest pain and had a slight elevation of troponin and a non-STEMI. At catheterization he was found to have severe stenosis involving the distal LAD that was not suitable for intervention and a moderate proximal stenosis. It was recommended he be treated medically. We also talked about placing a watchman device in him because he has  paroxysmal atrial arrhythmias. He has recurrent bladder cancer and is in need of work and has had significant hematuria and bleeding previously on anticoagulation. His flecainide was stopped and he was in the hospital and he is now on amiodarone. He complained of some mild dizziness yesterday and is considering going into rehabilitation. Blood pressure was somewhat low yesterday. He has had no recurrent angina since discharge ____________________________ PAST HISTORY  Past Medical Illnesses:  kidney stones, prostate cancer, sleep apnea, bladder cancer, gilberts syndrome, hypertension, obesity, BPH;  Cardiovascular Illnesses:  atrial flutter-paroxysmal, CAD;  Surgical Procedures:  hernia repair, laminectomy lumbar, fx l fibula, bladder surg, vasectomy;  NYHA Classification:  I;  [place] Angina Classification:  Class 0: Asymptomatic;  Cardiology Procedures-Invasive:  cardiac cath (left) November 2016;  Cardiology Procedures-Noninvasive:  treadmill, event monitor April 2016, echocardiogram April 2016;  Cardiac Cath Results:  normal Left main, 90% stenosis distal LAD;  LVEF of 55% documented via echocardiogram on 06/07/2014,   CHA2DS2-VASC Score:  3 ____________________________ SOCIAL HISTORY Alcohol Use:  wine 1 per day;  Smoking:  never smoked;  Lifestyle:  divorced, remarried and 2 daughters;  Exercise:  1-2 times q week;  Occupation:  Charles Wright  makes Comptroller;  Residence:  lives with wife;   ____________________________ REVIEW OF SYSTEMS General:  obesity  Integumentary:basal cell Carcinoma Eyes: wears eye glasses/contact lenses Respiratory: denies dyspnea, cough, wheezing or hemoptysis. Cardiovascular:  please review HPI Abdominal: denies dyspepsia, GI bleeding, constipation, or diarrhea Genitourinary-Male: nocturia, recent bladder surgery and BCG  Musculoskeletal:  lumbar disc disease, Baker's cyst Neurological:  denies headaches, stroke, or  TIA  ____________________________ PHYSICAL  EXAMINATION VITAL SIGNS  Blood Pressure:  102/56 Sitting, Right arm, regular cuff   Pulse:  72/min. Weight:  207.00 lbs. Height:  68"BMI: 31  Constitutional:  pleasant white male in no acute distress, mildly obese Skin:  warm and dry to touch, no apparent skin lesions, or masses noted. Head:  normocephalic, normal hair pattern, no masses or tenderness Neck:  supple, without massess. No JVD, thyromegaly or carotid bruits. Carotid upstroke normal. Chest:  normal symmetry, clear to auscultation. Cardiac:  regular rhythm, normal S1 and S2, No S3 or S4, no murmurs, gallops or rubs detected. Peripheral Pulses:  the femoral,dorsalis pedis, and posterior tibial pulses are full and equal bilaterally with no bruits auscultated. Extremities & Back:  mild bilateral venous insufficiency changes present, radial cath site with ecchymoses Neurological:  no gross motor or sensory deficits noted, affect appropriate, oriented x3. ____________________________ MOST RECENT LIPID PANEL 03/21/14  CHOL TOTL 176 mg/dl, LDL 115 NM, HDL 35 mg/dl, TRIGLYCER 123 mg/dl, ALT 34 u/l, ALK PHOS 59 u/l, CHOL/HDL 5.0 (Calc) and AST 28 u/l ____________________________ IMPRESSIONS/PLAN  1. Coronary artery disease with recent non-STEMI with distal disease in the LAD not suitable for intervention 2. Paroxysmal atrial flutter and fibrillation 3. Recent urinary problems and bladder cancer 4. Hyperlipidemia 5. Sleep apnea  Recommendations:  History clinically doing well following his recent non-STEMI. Medications updated. I like him to enter rehabilitation. We talked about placement of a watchman device that he would like to proceed with. He will need a screening TEE and I will go ahead and arrange that and then for consultation with Dr. Rayann Wright. He is somewhat dizzy and blood pressure is low and I asked him to stop his losartan. Followup with me in 3 months.  ____________________________ TODAYS ORDERS  1. Return Visit: 3  months                       ____________________________ Cardiology Physician:  Kerry Hough MD [facility]

## 2014-12-24 ENCOUNTER — Ambulatory Visit (INDEPENDENT_AMBULATORY_CARE_PROVIDER_SITE_OTHER): Payer: Medicare Other | Admitting: Internal Medicine

## 2014-12-24 ENCOUNTER — Encounter: Payer: Self-pay | Admitting: Internal Medicine

## 2014-12-24 VITALS — BP 118/75 | HR 73 | Temp 97.4°F | Wt 210.0 lb

## 2014-12-24 DIAGNOSIS — R300 Dysuria: Secondary | ICD-10-CM

## 2014-12-24 NOTE — Progress Notes (Signed)
[facility] for Infectious Disease  Patient Active Problem List   Diagnosis Date Noted  . Bladder cancer [facility])     Priority: High  . Dysuria 12/24/2014    Priority: Medium  . NSTEMI (non-ST elevated myocardial infarction) ([place]) 12/14/2014  . Paroxysmal atrial flutter ([place]) 12/14/2014  . Essential hypertension 12/14/2014  . Hyperlipidemia 12/14/2014  . CAD in native artery 12/14/2014  . Chest pain 12/12/2014  . Urethral stricture 10/28/2014  . Paroxysmal atrial fibrillation ([place]) 08/13/2014  . Long-term (current) use of anticoagulants 08/13/2014  . Obesity (BMI 30-39.9) 08/13/2014  . Hypertensive heart disease   . Sleep apnea   . Prostate cancer ([place])   . Lumbar stenosis with neurogenic claudication 12/03/2013  . Upper airway cough syndrome 01/27/2013    Patient's Medications  New Prescriptions   No medications on file  Previous Medications   ALLOPURINOL (ZYLOPRIM) 300 MG TABLET    Take 300 mg by mouth every morning.    ASPIRIN EC 81 MG EC TABLET    Take 1 tablet (81 mg total) by mouth daily.   CHOLECALCIFEROL (VITAMIN D-3) 1000 UNITS CAPS    Take 1,000 Units by mouth 2 (two) times daily.    CLOPIDOGREL (PLAVIX) 75 MG TABLET    Take 1 tablet (75 mg total) by mouth daily with breakfast.   EPINEPHRINE 0.3 MG/0.3 ML IJ SOAJ INJECTION    Inject 0.3 mLs (0.3 mg total) into the muscle once.   GLUCOSAMINE SULFATE (DONA PO)    Take 1 tablet by mouth 2 (two) times daily.    HYDROXYZINE (ATARAX/VISTARIL) 25 MG TABLET    Take 1 tablet (25 mg total) by mouth at bedtime.   ISOSORBIDE MONONITRATE (IMDUR) 30 MG 24 HR TABLET    Take 1 tablet (30 mg total) by mouth daily.   METOPROLOL (LOPRESSOR) 50 MG TABLET    Take 50 mg by mouth 2 (two) times daily.   MULTIPLE VITAMIN (MULTIVITAMIN) TABLET    Take 1 tablet by mouth 2 (two) times daily.    NITROGLYCERIN (NITROSTAT) 0.4 MG SL TABLET    Place 1 tablet (0.4 mg total) under the tongue every 5 (five) minutes x 3 doses as needed  for chest pain.   PRAVASTATIN (PRAVACHOL) 40 MG TABLET    Take 1 tablet (40 mg total) by mouth daily.   VALSARTAN (DIOVAN) 160 MG TABLET    Take 160 mg by mouth every morning.   Modified Medications   No medications on file  Discontinued Medications   No medications on file    Subjective: Mr. Murzyn is in for his routine follow-up visit. He states that his dysuria, urinary frequency, nocturia and urgency are markedly improved. He continues to take his hydroxyzine. The urine AFB culture collected on 10/28/2014 was negative so I had him stop ethambutol and rifampin on 12/11/2014. His bladder biopsy did not reveal in the granulomatous inflammation.  Review of Systems: Review of Systems  Constitutional: Negative for fever, chills and diaphoresis.  Genitourinary: Positive for frequency. Negative for dysuria and urgency.    Past Medical History  Diagnosis Date  . Hypertension   . Hyperlipidemia   . Gilbert's syndrome   . Wears glasses   . GERD (gastroesophageal reflux disease)     takes  OTC periodically  . Paroxysmal a-fib [facility])     cardiologist-  dr Wynonia Lawman  . OSA (obstructive sleep apnea)     2011 -- POSITIVE STUDY W/ CPAP RX  BUT NON-COMPLIANT  . History of kidney stones   . Bladder cancer (HCC)     BCG's tx's  . Prostate cancer [facility])     last PSA  2.9  (montiored by urologist  dr Junious Silk)  . BPH (benign prostatic hypertrophy)   . Paroxysmal atrial flutter ([place])   . CAD (coronary artery disease)     a. NSTEMI 12/2014 -  99% dLAD-2 (not amenable to PCI), 50% dLAD-1, 60% mLAD. Medical therapy was recommended.  . H/O cardiac catheterization     a. Note: Difficult radial access in 12/2014 - recommend femoral approach if cath needed in the future.    Social History  Substance Use Topics  . Smoking status: Never Smoker   . Smokeless tobacco: Never Used  . Alcohol Use: 4.2 oz/week    7 Glasses of wine per week     Comment: red wine glass daily    Family History  Problem  Relation Age of Onset  . Leukemia Mother   . Heart attack Father   . Cancer Sister     Allergies  Allergen Reactions  . Bee Venom Anaphylaxis    Actually Hornets and Wasps.   . Losartan Other (See Comments)    Cough   . Oxycodone Other (See Comments)    ALTERED MENTAL STATIS    Objective: Filed Vitals:   12/24/14 1616  BP: 118/75  Pulse: 73  Temp: 97.4 F (36.3 C)  TempSrc: Oral  Weight: 210 lb (95.255 kg)   Body mass index is 32.39 kg/(m^2).  Physical Exam  Constitutional: No distress.  Abdominal: Soft. There is no tenderness.  Psychiatric: Mood and affect normal.    Lab Results    Problem List Items Addressed This Visit      Medium   Dysuria - Primary    The exact cause of his recent severe dysuria, frequency and urgency is not entirely clear but it was temporally associated with BCG bladder instillations. He had no evidence of local or systemic infection but I suspect that the dysuria was caused by an over exuberant inflammatory reaction in the bladder triggered by BCG. There is no indication for further cultures were antibiotic therapy. He will follow-up here as needed.          Michel Bickers, MD [facility] for [facility] [phone] pager   [phone] cell 12/24/2014, 4:34 PM

## 2014-12-24 NOTE — Assessment & Plan Note (Signed)
The exact cause of his recent severe dysuria, frequency and urgency is not entirely clear but it was temporally associated with BCG bladder instillations. He had no evidence of local or systemic infection but I suspect that the dysuria was caused by an over exuberant inflammatory reaction in the bladder triggered by BCG. There is no indication for further cultures were antibiotic therapy. He will follow-up here as needed.

## 2014-12-26 ENCOUNTER — Other Ambulatory Visit (HOSPITAL_COMMUNITY): Payer: Self-pay | Admitting: Family Medicine

## 2014-12-26 ENCOUNTER — Other Ambulatory Visit: Payer: Self-pay | Admitting: Family Medicine

## 2014-12-26 ENCOUNTER — Ambulatory Visit
Admission: RE | Admit: 2014-12-26 | Discharge: 2014-12-26 | Disposition: A | Discharge status: Home / Self Care | Payer: Medicare Other | Source: Ambulatory Visit | Attending: Family Medicine | Admitting: Family Medicine

## 2014-12-26 DIAGNOSIS — R600 Localized edema: Secondary | ICD-10-CM

## 2014-12-26 DIAGNOSIS — M7989 Other specified soft tissue disorders: Secondary | ICD-10-CM

## 2014-12-27 ENCOUNTER — Ambulatory Visit (HOSPITAL_COMMUNITY)
Admission: RE | Admit: 2014-12-27 | Discharge: 2014-12-27 | Disposition: A | Discharge status: Home / Self Care | Payer: Medicare Other | Source: Ambulatory Visit | Attending: Cardiovascular Disease | Admitting: Cardiovascular Disease

## 2014-12-27 DIAGNOSIS — M7989 Other specified soft tissue disorders: Secondary | ICD-10-CM | POA: Insufficient documentation

## 2014-12-27 DIAGNOSIS — I10 Essential (primary) hypertension: Secondary | ICD-10-CM | POA: Insufficient documentation

## 2014-12-27 DIAGNOSIS — M79605 Pain in left leg: Secondary | ICD-10-CM | POA: Insufficient documentation

## 2014-12-27 DIAGNOSIS — E785 Hyperlipidemia, unspecified: Secondary | ICD-10-CM | POA: Diagnosis not present

## 2014-12-29 NOTE — Progress Notes (Signed)
Electrophysiology Office Note Date: 12/30/2014  ID:  Charles Wright, DOB 10-27-1935, MRN [id number]  PCP: Vena Austria, MD Primary Cardiologist: Wynonia Lawman Electrophysiologist: Enriqueta Augusta  CC: evaluate for Watchman implant  Charles Wright is a 79 y.o. male seen today for consideration of left atrial appendage occluder.  He has a history of paroxysmal atrial fibrillation controlled currently with amiodarone.  He has had bladder cancer with recurrent hematuria and inability to take long term anticoagulation.   Since last being seen in our clinic, the patient reports doing reasonably well.  He was recently admitted with chest pain and catheterization demonstrated CAD not easily amenable to revascularization.  He was started on ASA/Plavix at that time.  He has been maintaining SR by symptoms on amiodarone.  He denies chest pain, palpitations, dyspnea, PND, orthopnea, nausea, vomiting, dizziness, syncope, edema, weight gain, or early satiety.  Past Medical History  Diagnosis Date  . Hypertension   . Hyperlipidemia   . Gilbert's syndrome   . Wears glasses   . GERD (gastroesophageal reflux disease)     takes  OTC periodically  . Paroxysmal a-fib [facility])     cardiologist-  dr Wynonia Lawman  . OSA (obstructive sleep apnea)     2011 -- POSITIVE STUDY W/ CPAP RX BUT NON-COMPLIANT  . History of kidney stones   . Bladder cancer (HCC)     BCG's tx's  . Prostate cancer [facility])     last PSA  2.9  (montiored by urologist  dr Junious Silk)  . BPH (benign prostatic hypertrophy)   . Paroxysmal atrial flutter ([place])   . CAD (coronary artery disease)     a. NSTEMI 12/2014 -  99% dLAD-2 (not amenable to PCI), 50% dLAD-1, 60% mLAD. Medical therapy was recommended.  . H/O cardiac catheterization     a. Note: Difficult radial access in 12/2014 - recommend femoral approach if cath needed in the future.   Past Surgical History  Procedure Laterality Date  . Orif left ankle fx  2005  . Bilateral inguinal hernia repair     . Transurethral resection of bladder tumor  10/22/2011    Procedure: TRANSURETHRAL RESECTION OF BLADDER TUMOR (TURBT);  Surgeon: Charles Bonine, MD;  Location: [facility];  Service: Urology;  Laterality: N/A;  TURBT, LEFT URETEROSCOPY, POSSIBLE URETERAL STENT   . Ureteroscopy  10/22/2011    Procedure: URETEROSCOPY;  Surgeon: Charles Bonine, MD;  Location: [facility];  Service: Urology;  Laterality: Left;  . Prostate biopsy N/A 08/22/2012    Procedure: BIOPSY TRANSRECTAL ULTRASONIC PROSTATE (TUBP);  Surgeon: Charles Bonine, MD;  Location: [facility];  Service: Urology;  Laterality: N/A;  . Cystoscopy w/ retrogrades Left 08/22/2012    Procedure: CYSTOSCOPY WITH RETROGRADE PYELOGRAM;  left kidney washings;  Surgeon: Charles Bonine, MD;  Location: [facility];  Service: Urology;  Laterality: Left;  . Cystoscopy with biopsy  08/22/2012    Procedure: CYSTOSCOPY WITH BIOPSY;  Surgeon: Charles Bonine, MD;  Location: [facility];  Service: Urology;;  . Cystoscopy/retrograde/ureteroscopy Bilateral 08/17/2013    Procedure: CYSTOSCOPY, BLADDER BIOPSY WITH BILATERAL RETROGRADE PYELOGRAM, LEFT URETEROSCOPY AND DILATION OF STRICTURE ;  Surgeon: Charles Aloe, MD;  Location: [facility];  Service: Urology;  Laterality: Bilateral;  . Lumbar laminectomy/decompression microdiscectomy Left 12/03/2013    Procedure: Left Lumbar three-four diskectomy with Lumbar four laminectomy ;  Surgeon: Newman Pies, MD;  Location: [facility];  Service: Neurosurgery;  Laterality: Left;  Left Lumbar three-four diskectomy with Lumbar four laminectomy   . Wisdom tooth extraction    . Cystoscopy with retrograde pyelogram, ureteroscopy and stent placement Bilateral 07/30/2014    Procedure: CYSTOSCOPY WITH BLADDER [facility],;  Surgeon: Charles Aloe, MD;   Location: [facility];  Service: Urology;  Laterality: Bilateral;  . Cystoscopy with biopsy N/A 11/08/2014    Procedure: CYSTOSCOPY/BIOPSPY BLADDER INSTILLATION OF MARCAINE AND PYRIDIUM;  Surgeon: Charles Aloe, MD;  Location: [facility];  Service: Urology;  Laterality: N/A;  . Cardiac catheterization N/A 12/13/2014    Procedure: Left Heart Cath and Coronary Angiography;  Surgeon: Charles Man, MD;  Location: [facility];  Service: Cardiovascular;  Laterality: N/A;    Current Outpatient Prescriptions  Medication Sig Dispense Refill  . allopurinol (ZYLOPRIM) 300 MG tablet Take 300 mg by mouth every morning.     Marland Kitchen amiodarone (PACERONE) 200 MG tablet Take 200 mg by mouth daily.  0  . aspirin EC 81 MG EC tablet Take 1 tablet (81 mg total) by mouth daily.    . Cholecalciferol (VITAMIN D-3) 1000 UNITS CAPS Take 1,000 Units by mouth 2 (two) times daily.     . clopidogrel (PLAVIX) 75 MG tablet Take 1 tablet (75 mg total) by mouth daily with breakfast. 30 tablet 12  . COLCRYS 0.6 MG tablet Take 0.6 mg by mouth daily as needed. gout  0  . EPINEPHrine 0.3 mg/0.3 mL IJ SOAJ injection Inject 0.3 mLs (0.3 mg total) into the muscle once. 2 Device 1  . Glucosamine Sulfate (DONA PO) Take 1 tablet by mouth 2 (two) times daily.     Marland Kitchen HYDROcodone-acetaminophen (NORCO/VICODIN) 5-325 MG tablet Take 1-2 tablets by mouth every 6 (six) hours as needed. Foot pain  0  . hydrOXYzine (ATARAX/VISTARIL) 25 MG tablet Take 1 tablet (25 mg total) by mouth at bedtime. 30 tablet 0  . isosorbide mononitrate (IMDUR) 30 MG 24 hr tablet Take 1 tablet (30 mg total) by mouth daily. 30 tablet 12  . metoprolol (LOPRESSOR) 50 MG tablet Take 50 mg by mouth 2 (two) times daily.    . Multiple Vitamin (MULTIVITAMIN) tablet Take 1 tablet by mouth 2 (two) times daily.     . nitroGLYCERIN (NITROSTAT) 0.4 MG SL tablet Place 1 tablet (0.4 mg total) under the tongue every 5 (five) minutes x 3 doses as needed for chest pain. 25  tablet 12  . pravastatin (PRAVACHOL) 40 MG tablet Take 1 tablet (40 mg total) by mouth daily. 30 tablet    No current facility-administered medications for this visit.    Allergies:   Bee venom; Losartan; and Oxycodone   Social History: Social History   Social History  . Marital Status: Married    Spouse Name: N/A  . Number of Children: N/A  . Years of Education: N/A   Occupational History  . Retired Other   Social History Main Topics  . Smoking status: Never Smoker   . Smokeless tobacco: Never Used  . Alcohol Use: 4.2 oz/week    7 Glasses of wine per week     Comment: red wine glass daily  . Drug Use: No  . Sexual Activity: Not on file   Other Topics Concern  . Not on file   Social History Narrative    Family History: Family History  Problem Relation Age of Onset  . Leukemia Mother   . Heart attack Father   . Cancer Sister  Review of Systems: All other systems reviewed and are otherwise negative except as noted above.   Physical Exam: VS:  BP 124/50 mmHg  Pulse 62  Ht 5' 7.5" (1.715 m)  Wt 207 lb 12.8 oz (94.257 kg)  BMI 32.05 kg/m2 , BMI Body mass index is 32.05 kg/(m^2). Wt Readings from Last 3 Encounters:  12/30/14 207 lb 12.8 oz (94.257 kg)  12/24/14 210 lb (95.255 kg)  12/14/14 207 lb 3.2 oz (93.985 kg)    GEN- The patient is well appearing, alert and oriented x 3 today.   HEENT: normocephalic, atraumatic; sclera clear, conjunctiva pink; hearing intact; oropharynx clear; neck supple Lungs- Clear to ausculation bilaterally, normal work of breathing.  No wheezes, rales, rhonchi Heart- Regular rate and rhythm, no murmurs, rubs or gallops, PMI not laterally displaced GI- soft, non-tender, non-distended, bowel sounds present Extremities- no clubbing, cyanosis, or edema; DP/PT/radial pulses 2+ bilaterally MS- no significant deformity or atrophy Skin- warm and dry, no rash or lesion  Psych- euthymic mood, full affect Neuro- strength and sensation  are intact   EKG:  EKG is not ordered today.  Recent Labs: 12/12/2014: TSH 3.195 12/14/2014: ALT 24; BUN 14; Creatinine, Ser 0.96; Hemoglobin 13.3; Platelets 289; Potassium 4.5; Sodium 138    Other studies Reviewed: Additional studies/ records that were reviewed today include: Dr Thurman Coyer office notes  Assessment and Plan: 1.  Paroxysmal atrial fibrillation Clinically maintaining SR on amiodarone (LFT,TSH recently normal, annual eye exams recommended). I have seen AMEYA SEARCH is a 79 y.o. male in the office today who has been referred by Dr Wynonia Lawman for a Watchman left atrial appendage closure device.  He has a history of paroxysmal atrial fibrillation.  This patients CHA2DS2-VASc Score and unadjusted Ischemic Stroke Rate (% per year) is equal to 4.8 % stroke rate/year from a score of 4 which necessitates long term oral anticoagulation to prevent stroke. HasBled score is 3.  Modified Rankin Score is 0. Unfortunately, He is not felt to be a long term Warfarin candidate secondary to persistent hematuria.  The patients chart has been reviewed and I along with their referring cardiologist feel that they would be a candidate for short term oral anticoagulation.  Procedural risks for the Watchman implant have been reviewed with the patient including a 1% risk of stroke, 2% risk of perforation, 0.1% risk of device embolization.  Given the patient's poor candidacy for long-term oral anticoagulation, ability to tolerate short term oral anticoagulation, I have recommended the watchman left atrial appendage closure system.  TEE will be scheduled to review LAA anatomy.  The patient understands that the ability to implant Watchman is dependent on results of the TEE.  If patient is candidate for Watchman based on TEE results, we will schedule the procedure at the next available time.     Current medicines are reviewed at length with the patient today.   The patient does not have concerns regarding his medicines.   The following changes were made today:  none  Labs/ tests ordered today include:  Orders Placed This Encounter  Procedures  . Basic Metabolic Panel (BMET)  . CBC w/Diff     Disposition:   Follow up with TEE 11/22 with Dr Debara Pickett, Watchman implant 12/1     Signed, [name], MD  12/30/2014 11:45 AM   [facility] [address] [place] [address] [phone] (office) [phone] (fax)

## 2014-12-30 ENCOUNTER — Other Ambulatory Visit: Payer: Self-pay

## 2014-12-30 ENCOUNTER — Ambulatory Visit (INDEPENDENT_AMBULATORY_CARE_PROVIDER_SITE_OTHER): Payer: Medicare Other | Admitting: Internal Medicine

## 2014-12-30 ENCOUNTER — Encounter: Payer: Self-pay | Admitting: Nurse Practitioner

## 2014-12-30 VITALS — BP 124/50 | HR 62 | Ht 67.5 in | Wt 207.8 lb

## 2014-12-30 DIAGNOSIS — I4891 Unspecified atrial fibrillation: Secondary | ICD-10-CM

## 2014-12-30 DIAGNOSIS — I480 Paroxysmal atrial fibrillation: Secondary | ICD-10-CM

## 2014-12-30 LAB — CBC WITH DIFFERENTIAL/PLATELET
BASOS ABS: 0 10*3/uL (ref 0.0–0.1)
BASOS PCT: 0 % (ref 0–1)
EOS ABS: 0.4 10*3/uL (ref 0.0–0.7)
EOS PCT: 4 % (ref 0–5)
HEMATOCRIT: 39.8 % (ref 39.0–52.0)
Hemoglobin: 13.4 g/dL (ref 13.0–17.0)
Lymphocytes Relative: 19 % (ref 12–46)
Lymphs Abs: 1.8 10*3/uL (ref 0.7–4.0)
MCH: 30 pg (ref 26.0–34.0)
MCHC: 33.7 g/dL (ref 30.0–36.0)
MCV: 89.2 fL (ref 78.0–100.0)
MPV: 10 fL (ref 8.6–12.4)
Monocytes Absolute: 1 10*3/uL (ref 0.1–1.0)
Monocytes Relative: 11 % (ref 3–12)
Neutro Abs: 6.3 10*3/uL (ref 1.7–7.7)
Neutrophils Relative %: 66 % (ref 43–77)
PLATELETS: 319 10*3/uL (ref 150–400)
RBC: 4.46 MIL/uL (ref 4.22–5.81)
RDW: 14.1 % (ref 11.5–15.5)
WBC: 9.5 10*3/uL (ref 4.0–10.5)

## 2014-12-30 LAB — BASIC METABOLIC PANEL
BUN: 19 mg/dL (ref 7–25)
CALCIUM: 9 mg/dL (ref 8.6–10.3)
CO2: 25 mmol/L (ref 20–31)
CREATININE: 1.04 mg/dL (ref 0.70–1.18)
Chloride: 103 mmol/L (ref 98–110)
GLUCOSE: 94 mg/dL (ref 65–99)
POTASSIUM: 4.3 mmol/L (ref 3.5–5.3)
Sodium: 137 mmol/L (ref 135–146)

## 2014-12-30 NOTE — Patient Instructions (Addendum)
Medication Instructions:     If you need a refill on your cardiac medications before your next appointment, please call your pharmacy.  Labwork: BMET CBC    Testing/Procedures: WATCHMAN PROCEDURE 01/09/15 WITH DR Rayann Heman   *YOU HAVE BEEN SCHEDULED FOR  TEE       ON  11  /22  / 16   @ 3PM  WITH DR HILTY   *PLEASE ARRIVE @ 1:30PM     NORTH TOWER ENTRANCE TO BE DIRECTED TO        ADMITTING FROM FRONT DESK CLERK   *PLEASE HAVE NOTHING TO EAT OR DRINK AFTER MIDNIGHT THE NIGHT      BEFORE YOUR PROCEDURE   *HOLD THESE MEDICATIONS PRIOR TO YOUR PROCEDURE:     1. N/A       2. N/A  *YOU MAY TAKE YOUR MORNING MEDS WITH A SMALL AMOUNT OF WATER   * MAKE SURE YOU HAVE A CHANGE OF CLOTHING JUST  IN CASE YOU MAY        HAVE TO STAY OVER    Follow-Up:  WILL BE MADE AFTER WATCHMAN PROCEDURE ON 01/09/15 @ 7:30 AM  WITH DR ALLRED  YOU WILL GET ALL YOUR    INFORMATION FROM AMBER AT [place]    Any Other Special Instructions Will Be Listed Below (If Applicable).

## 2014-12-31 ENCOUNTER — Encounter (HOSPITAL_COMMUNITY)
Admission: RE | Disposition: A | Discharge status: Home / Self Care | Payer: Self-pay | Source: Ambulatory Visit | Attending: Internal Medicine

## 2014-12-31 ENCOUNTER — Ambulatory Visit (HOSPITAL_BASED_OUTPATIENT_CLINIC_OR_DEPARTMENT_OTHER)
Admission: RE | Admit: 2014-12-31 | Discharge: 2014-12-31 | Disposition: A | Discharge status: Home / Self Care | Payer: Medicare Other | Source: Ambulatory Visit | Attending: Nurse Practitioner | Admitting: Nurse Practitioner

## 2014-12-31 ENCOUNTER — Ambulatory Visit (HOSPITAL_COMMUNITY)
Admission: RE | Admit: 2014-12-31 | Discharge: 2014-12-31 | Disposition: A | Discharge status: Home / Self Care | Payer: Medicare Other | Source: Ambulatory Visit | Attending: Internal Medicine | Admitting: Internal Medicine

## 2014-12-31 ENCOUNTER — Encounter (HOSPITAL_COMMUNITY): Payer: Self-pay | Admitting: *Deleted

## 2014-12-31 DIAGNOSIS — I253 Aneurysm of heart: Secondary | ICD-10-CM | POA: Insufficient documentation

## 2014-12-31 DIAGNOSIS — I4891 Unspecified atrial fibrillation: Secondary | ICD-10-CM

## 2014-12-31 DIAGNOSIS — I081 Rheumatic disorders of both mitral and tricuspid valves: Secondary | ICD-10-CM | POA: Insufficient documentation

## 2014-12-31 HISTORY — PX: TEE WITHOUT CARDIOVERSION: SHX5443

## 2014-12-31 SURGERY — ECHOCARDIOGRAM, TRANSESOPHAGEAL
Anesthesia: Moderate Sedation

## 2014-12-31 MED ORDER — BUTAMBEN-TETRACAINE-BENZOCAINE 2-2-14 % EX AERO
INHALATION_SPRAY | CUTANEOUS | Status: DC | PRN
  Administered 2014-12-31: 2 via TOPICAL

## 2014-12-31 MED ORDER — MIDAZOLAM HCL 5 MG/ML IJ SOLN
INTRAMUSCULAR | Status: AC
  Filled 2014-12-31: qty 2

## 2014-12-31 MED ORDER — LIDOCAINE VISCOUS 2 % MT SOLN
OROMUCOSAL | Status: AC
Start: 2014-12-31 — End: 2014-12-31
  Filled 2014-12-31: qty 15

## 2014-12-31 MED ORDER — MIDAZOLAM HCL 10 MG/2ML IJ SOLN
INTRAMUSCULAR | Status: DC | PRN
  Administered 2014-12-31: 2 mg via INTRAVENOUS
  Administered 2014-12-31 (×2): 1 mg via INTRAVENOUS

## 2014-12-31 MED ORDER — FENTANYL CITRATE (PF) 100 MCG/2ML IJ SOLN
INTRAMUSCULAR | Status: AC
Start: 2014-12-31 — End: 2014-12-31
  Filled 2014-12-31: qty 2

## 2014-12-31 MED ORDER — LIDOCAINE VISCOUS 2 % MT SOLN
OROMUCOSAL | Status: DC | PRN
  Administered 2014-12-31: 5 mL via OROMUCOSAL

## 2014-12-31 MED ORDER — FENTANYL CITRATE (PF) 100 MCG/2ML IJ SOLN
INTRAMUSCULAR | Status: DC | PRN
  Administered 2014-12-31 (×2): 25 ug via INTRAVENOUS

## 2014-12-31 MED ORDER — SODIUM CHLORIDE 0.9 % IV SOLN: INTRAVENOUS | Status: DC

## 2014-12-31 NOTE — H&P (Signed)
     INTERVAL PROCEDURE H&P  History and Physical Interval Note:  12/31/2014 3:05 PM  Charles Wright has presented today for their planned procedure. The various methods of treatment have been discussed with the patient and family. After consideration of risks, benefits and other options for treatment, the patient has consented to the procedure.  [name] outpatient history has been reviewed, patient examined, and no change in status from most recent office note within the past 30 days. I have reviewed the patients' chart and labs and will proceed as planned. Questions were answered to the patient's satisfaction.   Charles Casino, MD, [facility] Attending Cardiologist [place] 12/31/2014, 3:05 PM

## 2014-12-31 NOTE — Discharge Instructions (Signed)

## 2014-12-31 NOTE — CV Procedure (Signed)
Marland Kitchen   TRANSESOPHAGEAL ECHOCARDIOGRAM (TEE) NOTE  - WATCHMAN ATRIAL APPENDAGE OCCLUDER DEVICE EVALUATION    INDICATIONS: atrial fibrillation  PROCEDURE:   Informed consent was obtained prior to the procedure. The risks, benefits and alternatives for the procedure were discussed and the patient comprehended these risks.  Risks include, but are not limited to, cough, sore throat, vomiting, nausea, somnolence, esophageal and stomach trauma or perforation, bleeding, low blood pressure, aspiration, pneumonia, infection, trauma to the teeth and death.    After a procedural time-out, the patient was given 5 mg versed and 50 mcg fentanyl for moderate sedation.  The oropharynx was anesthetized 10 cc of topical 1% viscous lidocaine and 2 cetacaine sprays.  The transesophageal probe was inserted in the esophagus and stomach without difficulty and multiple views were obtained.  The patient was kept under observation until the patient left the procedure room.  The patient left the procedure room in stable condition.   Agitated microbubble saline contrast was not administered.  COMPLICATIONS:    The patient had a significant gag reflex and increased airway secretions, therefore the procedure was prematurely discontinued for patient safety.  Findings:  1. LEFT VENTRICLE: The left ventricular wall thickness is normal.  The left ventricular cavity is normal in size. Wall motion is normal.  LVEF is 55-60%.  2. RIGHT VENTRICLE:  The right ventricle is normal in structure and function without any thrombus or masses.    3. LEFT ATRIUM:  The left atrium is dilated in size without any thrombus or masses.  There is not spontaneous echo contrast ("smoke") in the left atrium consistent with a low flow state.  4. LEFT ATRIAL APPENDAGE:  The left atrial appendage is free of any thrombus or masses. The appendage is very small and has a single lobe and a cone-shaped morphology.Pulse doppler indicates moderate flow in  the appendage. The LUPV is oriented parallel to the left atrial appendage. Pertinent measurements in the appendage are as follows:   0 degrees 45 degrees 90 degrees 135 degrees  LAA Ostium (mm) N/A 12 12 N/A  LAA Length (mm) N/A 16 15 N/A      *LAA size should be between >17 mm and <31 mm, length generally greater than ostial length. Measure 2 cm from the coumadin ridge to ostium and across from left coronary artery.  .      5. ATRIAL SEPTUM:  The atrial septum is aneurysmal, however, there is no evidence for interatrial shunting by color doppler.  6. RIGHT ATRIUM:  The right atrium is normal in size and function without any thrombus or masses.  7. MITRAL VALVE:  The mitral valve is normal in structure and function with Mild regurgitation.  There were no vegetations or stenosis.  8. AORTIC VALVE:  The aortic valve is trileaflet, normal in structure and function with no regurgitation.  There were no vegetations or stenosis  9. TRICUSPID VALVE:  The tricuspid valve is normal in structure and function with trivial regurgitation.  There were no vegetations or stenosis  10.  PULMONIC VALVE:  The pulmonic valve is normal in structure and function with no regurgitation.  There were no vegetations or stenosis.   11. AORTIC ARCH, ASCENDING AND DESCENDING AORTA:  Not visualized.  12. PULMONARY VEINS: Anomalous pulmonary venous return was not noted.  13. PERICARDIUM: The pericardium appeared normal and non-thickened.  There is no pericardial effusion.  IMPRESSION:   1. No LAA thrombus 2. Very small cone-shaped LAA - measuring at most 12  mm at the ostium 3. Mild MR, trivial TR 4. Normal LV function  RECOMMENDATIONS:   (available Watchman sizes: 21, 24, 27, 30, 33 mm)    1. Very small LAA - unclear if a 21 mm device would be able to be successfully deployed. If anticoagulation is contraindicated, may need to consider surgical closure of the appendage.  Time Spent Directly with the  Patient:  45 minutes   Pixie Casino, MD, [facility] Attending Cardiologist [facility]  12/31/2014, 3:51 PM

## 2014-12-31 NOTE — Progress Notes (Signed)
  Echocardiogram Echocardiogram Transesophageal has been performed.  Jennette Dubin 12/31/2014, 4:19 PM

## 2015-01-01 ENCOUNTER — Telehealth: Payer: Self-pay | Admitting: *Deleted

## 2015-01-01 ENCOUNTER — Encounter (HOSPITAL_COMMUNITY): Payer: Self-pay | Admitting: Internal Medicine

## 2015-01-01 NOTE — Telephone Encounter (Signed)
-----   Message from Patsey Berthold, NP sent at 12/31/2014  8:08 AM EST ----- Please notify patient of stable labs

## 2015-01-06 ENCOUNTER — Telehealth: Payer: Self-pay | Admitting: Nurse Practitioner

## 2015-01-06 NOTE — Telephone Encounter (Signed)
Reviewed TEE images with Watchman rep - he feels that patient is good candidate for device. Spoke with patient. Procedure scheduled for Thursday 12/1 at 7:30AM.  Pt to arrive at hospital at 6AM morning of procedure. NPO after midnight Wednesday night. Hold Plavix after today. Pt verbalized understanding.  Chanetta Marshall, NP 01/06/2015 8:26 AM

## 2015-01-09 ENCOUNTER — Ambulatory Visit (HOSPITAL_COMMUNITY)
Admission: RE | Admit: 2015-01-09 | Discharge: 2015-01-09 | Disposition: A | Discharge status: Home / Self Care | Payer: Medicare Other | Source: Ambulatory Visit | Attending: Internal Medicine | Admitting: Internal Medicine

## 2015-01-09 ENCOUNTER — Encounter (HOSPITAL_COMMUNITY)
Admission: RE | Disposition: A | Discharge status: Home / Self Care | Payer: Self-pay | Source: Ambulatory Visit | Attending: Internal Medicine

## 2015-01-09 ENCOUNTER — Ambulatory Visit (HOSPITAL_COMMUNITY): Payer: Medicare Other | Admitting: Certified Registered"

## 2015-01-09 ENCOUNTER — Encounter (HOSPITAL_COMMUNITY): Payer: Self-pay | Admitting: Certified Registered"

## 2015-01-09 ENCOUNTER — Ambulatory Visit (HOSPITAL_BASED_OUTPATIENT_CLINIC_OR_DEPARTMENT_OTHER): Payer: Medicare Other

## 2015-01-09 DIAGNOSIS — Z7982 Long term (current) use of aspirin: Secondary | ICD-10-CM | POA: Insufficient documentation

## 2015-01-09 DIAGNOSIS — I2510 Atherosclerotic heart disease of native coronary artery without angina pectoris: Secondary | ICD-10-CM | POA: Diagnosis not present

## 2015-01-09 DIAGNOSIS — Z8551 Personal history of malignant neoplasm of bladder: Secondary | ICD-10-CM | POA: Insufficient documentation

## 2015-01-09 DIAGNOSIS — I10 Essential (primary) hypertension: Secondary | ICD-10-CM | POA: Diagnosis not present

## 2015-01-09 DIAGNOSIS — Z79899 Other long term (current) drug therapy: Secondary | ICD-10-CM | POA: Insufficient documentation

## 2015-01-09 DIAGNOSIS — I252 Old myocardial infarction: Secondary | ICD-10-CM | POA: Diagnosis not present

## 2015-01-09 DIAGNOSIS — E785 Hyperlipidemia, unspecified: Secondary | ICD-10-CM | POA: Insufficient documentation

## 2015-01-09 DIAGNOSIS — Z8249 Family history of ischemic heart disease and other diseases of the circulatory system: Secondary | ICD-10-CM | POA: Insufficient documentation

## 2015-01-09 DIAGNOSIS — I4891 Unspecified atrial fibrillation: Secondary | ICD-10-CM | POA: Diagnosis not present

## 2015-01-09 DIAGNOSIS — N400 Enlarged prostate without lower urinary tract symptoms: Secondary | ICD-10-CM | POA: Diagnosis not present

## 2015-01-09 DIAGNOSIS — R319 Hematuria, unspecified: Secondary | ICD-10-CM | POA: Insufficient documentation

## 2015-01-09 DIAGNOSIS — I480 Paroxysmal atrial fibrillation: Secondary | ICD-10-CM | POA: Insufficient documentation

## 2015-01-09 DIAGNOSIS — Z8546 Personal history of malignant neoplasm of prostate: Secondary | ICD-10-CM | POA: Diagnosis not present

## 2015-01-09 DIAGNOSIS — K219 Gastro-esophageal reflux disease without esophagitis: Secondary | ICD-10-CM | POA: Diagnosis not present

## 2015-01-09 DIAGNOSIS — Z7902 Long term (current) use of antithrombotics/antiplatelets: Secondary | ICD-10-CM | POA: Diagnosis not present

## 2015-01-09 HISTORY — PX: LEFT ATRIAL APPENDAGE OCCLUSION: SHX173A

## 2015-01-09 LAB — TYPE AND SCREEN
ABO/RH(D): B POS
Antibody Screen: NEGATIVE

## 2015-01-09 LAB — ABO/RH: ABO/RH(D): B POS

## 2015-01-09 LAB — POCT ACTIVATED CLOTTING TIME
Activated Clotting Time: 245 seconds
Activated Clotting Time: 147 seconds

## 2015-01-09 SURGERY — LEFT ATRIAL APPENDAGE OCCLUSION
Anesthesia: Monitor Anesthesia Care

## 2015-01-09 MED ORDER — FENTANYL CITRATE (PF) 100 MCG/2ML IJ SOLN
INTRAMUSCULAR | Status: DC | PRN
  Administered 2015-01-09 (×2): 50 ug via INTRAVENOUS

## 2015-01-09 MED ORDER — HEPARIN (PORCINE) IN NACL 2-0.9 UNIT/ML-% IJ SOLN
INTRAMUSCULAR | Status: DC | PRN
  Administered 2015-01-09: 09:00:00

## 2015-01-09 MED ORDER — LIDOCAINE HCL (CARDIAC) 20 MG/ML IV SOLN
INTRAVENOUS | Status: DC | PRN
  Administered 2015-01-09: 40 mg via INTRAVENOUS

## 2015-01-09 MED ORDER — HEPARIN SODIUM (PORCINE) 1000 UNIT/ML IJ SOLN
INTRAMUSCULAR | Status: DC | PRN
  Administered 2015-01-09: 10000 [IU] via INTRAVENOUS

## 2015-01-09 MED ORDER — ROCURONIUM BROMIDE 100 MG/10ML IV SOLN
INTRAVENOUS | Status: DC | PRN
  Administered 2015-01-09: 10 mg via INTRAVENOUS
  Administered 2015-01-09: 50 mg via INTRAVENOUS

## 2015-01-09 MED ORDER — LACTATED RINGERS IV SOLN: INTRAVENOUS | Status: DC | PRN

## 2015-01-09 MED ORDER — CEFAZOLIN SODIUM-DEXTROSE 2-3 GM-% IV SOLR
2.0000 g | INTRAVENOUS | Status: AC
  Administered 2015-01-09: 2 g via INTRAVENOUS

## 2015-01-09 MED ORDER — PROPOFOL 10 MG/ML IV BOLUS
INTRAVENOUS | Status: DC | PRN
  Administered 2015-01-09: 160 mg via INTRAVENOUS

## 2015-01-09 MED ORDER — SUGAMMADEX SODIUM 200 MG/2ML IV SOLN
INTRAVENOUS | Status: DC | PRN
  Administered 2015-01-09: 200 mg via INTRAVENOUS

## 2015-01-09 MED ORDER — ONDANSETRON HCL 4 MG/2ML IJ SOLN
INTRAMUSCULAR | Status: DC | PRN
  Administered 2015-01-09: 4 mg via INTRAVENOUS

## 2015-01-09 MED ORDER — CEFAZOLIN SODIUM-DEXTROSE 2-3 GM-% IV SOLR
INTRAVENOUS | Status: AC
  Filled 2015-01-09: qty 50

## 2015-01-09 MED ORDER — SODIUM CHLORIDE 0.45 % IV SOLN: INTRAVENOUS | Status: DC

## 2015-01-09 MED ORDER — HEPARIN SODIUM (PORCINE) 1000 UNIT/ML IJ SOLN
INTRAMUSCULAR | Status: AC
  Filled 2015-01-09: qty 1

## 2015-01-09 MED ORDER — IOHEXOL 350 MG/ML SOLN
INTRAVENOUS | Status: DC | PRN
  Administered 2015-01-09: 30 mL via INTRAVENOUS
  Administered 2015-01-09: 40 mL via INTRACARDIAC

## 2015-01-09 MED ORDER — SODIUM CHLORIDE 0.9 % IV SOLN
INTRAVENOUS | Status: DC | PRN
  Administered 2015-01-09: 08:00:00 via INTRAVENOUS

## 2015-01-09 MED ORDER — BUPIVACAINE HCL (PF) 0.25 % IJ SOLN
INTRAMUSCULAR | Status: DC | PRN
  Administered 2015-01-09: 20 mL

## 2015-01-09 MED ORDER — PROTAMINE SULFATE 10 MG/ML IV SOLN
INTRAVENOUS | Status: DC | PRN
  Administered 2015-01-09 (×2): 10 mg via INTRAVENOUS

## 2015-01-09 MED ORDER — BUPIVACAINE HCL (PF) 0.25 % IJ SOLN
INTRAMUSCULAR | Status: AC
Start: 2015-01-09 — End: 2015-01-09
  Filled 2015-01-09: qty 30

## 2015-01-09 MED ORDER — SODIUM CHLORIDE 0.45 % IV BOLUS
1000.0000 mL | Freq: Once | INTRAVENOUS | Status: AC
  Administered 2015-01-09: 1000 mL via INTRAVENOUS

## 2015-01-09 MED ORDER — HEPARIN SODIUM (PORCINE) 1000 UNIT/ML IJ SOLN
INTRAMUSCULAR | Status: DC | PRN
  Administered 2015-01-09: 2000 [IU] via INTRAVENOUS

## 2015-01-09 MED ORDER — PHENYLEPHRINE HCL 10 MG/ML IJ SOLN
10.0000 mg | INTRAVENOUS | Status: DC | PRN
  Administered 2015-01-09: 10 ug/min via INTRAVENOUS

## 2015-01-09 MED ORDER — CLOPIDOGREL BISULFATE 75 MG PO TABS: 75.0000 mg | ORAL_TABLET | Freq: Every day | ORAL | Status: DC

## 2015-01-09 SURGICAL SUPPLY — 18 items
BAG SNAP BAND KOVER 36X36 (MISCELLANEOUS) ×3 IMPLANT
BLANKET WARM UNDERBOD FULL ACC (MISCELLANEOUS) ×3 IMPLANT
CATH DIAG 6FR PIGTAIL (CATHETERS) ×3 IMPLANT
DILATOR VESSEL 38 20CM 8FR (INTRODUCER) ×3 IMPLANT
INTRODUCER SWARTZ SL2 8F (SHEATH) ×3 IMPLANT
KIT HEART LEFT (KITS) ×3 IMPLANT
NDL TRANSSPETAL BRK-XS 71CM (NEEDLE) ×1 IMPLANT
NEEDLE TRANSEP BRK 71CM 407200 (NEEDLE) ×3 IMPLANT
NEEDLE TRANSSPETAL BRK-XS 71CM (NEEDLE) ×2
PACK CARDIAC CATHETERIZATION (CUSTOM PROCEDURE TRAY) ×3 IMPLANT
PAD DEFIB LIFELINK (PAD) ×3 IMPLANT
SHEATH INTRO CHECKFLO 16F 13 (SHEATH) ×1 IMPLANT
SHEATH INTRO CHECKFLO 16F 13CM (SHEATH) ×2
SHIELD RADPAD SCOOP 12X17 (MISCELLANEOUS) ×3 IMPLANT
TRANSDUCER W/STOPCOCK (MISCELLANEOUS) ×3 IMPLANT
TUBING CIL FLEX 10 FLL-RA (TUBING) ×3 IMPLANT
WIRE AMPLATZ WHISKJ .035X260CM (WIRE) ×3 IMPLANT
WIRE MAILMAN 182CM (WIRE) ×3 IMPLANT

## 2015-01-09 NOTE — Progress Notes (Signed)
Dr. Rayann Heman by to see patient. OK for patient to return to short stay.

## 2015-01-09 NOTE — Interval H&P Note (Signed)
History and Physical Interval Note:  01/09/2015 7:33 AM  Charles Wright  has presented today for surgery, with the diagnosis of afib  The various methods of treatment have been discussed with the patient and family. After consideration of risks, benefits and other options for treatment, the patient has consented to  Procedure(s): LEFT ATRIAL APPENDAGE OCCLUSION (N/A) as a surgical intervention .  The patient's history has been reviewed, patient examined, no change in status, stable for surgery.  I have reviewed the patient's chart and labs.  Questions were answered to the patient's satisfaction.    I have spoken with Dr Wynonia Lawman who has reviewed the procedure with the patient. We agree that implant is indicated for this patient.   Charles Wright

## 2015-01-09 NOTE — Anesthesia Preprocedure Evaluation (Addendum)
Anesthesia Evaluation  Patient identified by MRN, date of birth, ID band Patient awake    Reviewed: Allergy & Precautions, NPO status , Patient's Chart, lab work & pertinent test results, reviewed documented beta blocker date and time   Airway Mallampati: II  TM Distance: >3 FB Neck ROM: Full    Dental  (+) Teeth Intact, Dental Advisory Given   Pulmonary sleep apnea ,    breath sounds clear to auscultation       Cardiovascular hypertension, Pt. on home beta blockers + CAD and + Past MI   Rhythm:Regular Rate:Normal     Neuro/Psych    GI/Hepatic GERD  Medicated,  Endo/Other    Renal/GU      Musculoskeletal   Abdominal   Peds  Hematology   Anesthesia Other Findings   Reproductive/Obstetrics                            Anesthesia Physical Anesthesia Plan  ASA: III  Anesthesia Plan: General   Post-op Pain Management:    Induction: Intravenous  Airway Management Planned: Oral ETT  Additional Equipment:   Intra-op Plan:   Post-operative Plan: Extubation in OR  Informed Consent: I have reviewed the patients History and Physical, chart, labs and discussed the procedure including the risks, benefits and alternatives for the proposed anesthesia with the patient or authorized representative who has indicated his/her understanding and acceptance.   Dental advisory given  Plan Discussed with: CRNA and Anesthesiologist  Anesthesia Plan Comments:         Anesthesia Quick Evaluation

## 2015-01-09 NOTE — Progress Notes (Signed)
Site area: left radial a line Site Prior to Removal:  Level  0 Pressure Applied For: 15 minutes Manual:   yes Patient Status During Pull:  stable Post Pull Site:  Level  0, faint bruise-several puncture sites Post Pull Instructions Given:  yes Post Pull Pulses Present: yes Dressing Applied:  yes Bedrest begins @  Comments:

## 2015-01-09 NOTE — H&P (View-Only) (Signed)
Electrophysiology Office Note Date: 12/30/2014  ID:  Charles Wright, DOB 08-01-1935, MRN [id number]  PCP: Charles Austria, MD Primary Cardiologist: Charles Wright Electrophysiologist: Arshawn Valdez  CC: evaluate for Watchman implant  Charles Wright is a 79 y.o. male seen today for consideration of left atrial appendage occluder.  He has a history of paroxysmal atrial fibrillation controlled currently with amiodarone.  He has had bladder cancer with recurrent hematuria and inability to take long term anticoagulation.   Since last being seen in our clinic, the patient reports doing reasonably well.  He was recently admitted with chest pain and catheterization demonstrated CAD not easily amenable to revascularization.  He was started on ASA/Plavix at that time.  He has been maintaining SR by symptoms on amiodarone.  He denies chest pain, palpitations, dyspnea, PND, orthopnea, nausea, vomiting, dizziness, syncope, edema, weight gain, or early satiety.  Past Medical History  Diagnosis Date  . Hypertension   . Hyperlipidemia   . Gilbert's syndrome   . Wears glasses   . GERD (gastroesophageal reflux disease)     takes  OTC periodically  . Paroxysmal a-fib [facility])     cardiologist-  dr Charles Wright  . OSA (obstructive sleep apnea)     2011 -- POSITIVE STUDY W/ CPAP RX BUT NON-COMPLIANT  . History of kidney stones   . Bladder cancer (HCC)     BCG's tx's  . Prostate cancer [facility])     last PSA  2.9  (montiored by urologist  dr Charles Wright)  . BPH (benign prostatic hypertrophy)   . Paroxysmal atrial flutter ([place])   . CAD (coronary artery disease)     a. NSTEMI 12/2014 -  99% dLAD-2 (not amenable to PCI), 50% dLAD-1, 60% mLAD. Medical therapy was recommended.  . H/O cardiac catheterization     a. Note: Difficult radial access in 12/2014 - recommend femoral approach if cath needed in the future.   Past Surgical History  Procedure Laterality Date  . Orif left ankle fx  2005  . Bilateral inguinal hernia repair     . Transurethral resection of bladder tumor  10/22/2011    Procedure: TRANSURETHRAL RESECTION OF BLADDER TUMOR (TURBT);  Surgeon: Charles Bonine, MD;  Location: [facility];  Service: Urology;  Laterality: N/A;  TURBT, LEFT URETEROSCOPY, POSSIBLE URETERAL STENT   . Ureteroscopy  10/22/2011    Procedure: URETEROSCOPY;  Surgeon: Charles Bonine, MD;  Location: [facility];  Service: Urology;  Laterality: Left;  . Prostate biopsy N/A 08/22/2012    Procedure: BIOPSY TRANSRECTAL ULTRASONIC PROSTATE (TUBP);  Surgeon: Charles Bonine, MD;  Location: [facility];  Service: Urology;  Laterality: N/A;  . Cystoscopy w/ retrogrades Left 08/22/2012    Procedure: CYSTOSCOPY WITH RETROGRADE PYELOGRAM;  left kidney washings;  Surgeon: Charles Bonine, MD;  Location: [facility];  Service: Urology;  Laterality: Left;  . Cystoscopy with biopsy  08/22/2012    Procedure: CYSTOSCOPY WITH BIOPSY;  Surgeon: Charles Bonine, MD;  Location: [facility];  Service: Urology;;  . Cystoscopy/retrograde/ureteroscopy Bilateral 08/17/2013    Procedure: CYSTOSCOPY, BLADDER BIOPSY WITH BILATERAL RETROGRADE PYELOGRAM, LEFT URETEROSCOPY AND DILATION OF STRICTURE ;  Surgeon: Charles Aloe, MD;  Location: [facility];  Service: Urology;  Laterality: Bilateral;  . Lumbar laminectomy/decompression microdiscectomy Left 12/03/2013    Procedure: Left Lumbar three-four diskectomy with Lumbar four laminectomy ;  Surgeon: Newman Pies, MD;  Location: [place] [facility];  Service: Neurosurgery;  Laterality: Left;  Left Lumbar three-four diskectomy with Lumbar four laminectomy   . Wisdom tooth extraction    . Cystoscopy with retrograde pyelogram, ureteroscopy and stent placement Bilateral 07/30/2014    Procedure: CYSTOSCOPY WITH BLADDER BX [place],;  Surgeon: Charles Aloe, MD;   Location: [facility];  Service: Urology;  Laterality: Bilateral;  . Cystoscopy with biopsy N/A 11/08/2014    Procedure: CYSTOSCOPY/BIOPSPY BLADDER INSTILLATION OF MARCAINE AND PYRIDIUM;  Surgeon: Charles Aloe, MD;  Location: [facility];  Service: Urology;  Laterality: N/A;  . Cardiac catheterization N/A 12/13/2014    Procedure: Left Heart Cath and Coronary Angiography;  Surgeon: Charles Man, MD;  Location: [place] [facility];  Service: Cardiovascular;  Laterality: N/A;    Current Outpatient Prescriptions  Medication Sig Dispense Refill  . allopurinol (ZYLOPRIM) 300 MG tablet Take 300 mg by mouth every morning.     Marland Kitchen amiodarone (PACERONE) 200 MG tablet Take 200 mg by mouth daily.  0  . aspirin EC 81 MG EC tablet Take 1 tablet (81 mg total) by mouth daily.    . Cholecalciferol (VITAMIN D-3) 1000 UNITS CAPS Take 1,000 Units by mouth 2 (two) times daily.     . clopidogrel (PLAVIX) 75 MG tablet Take 1 tablet (75 mg total) by mouth daily with breakfast. 30 tablet 12  . COLCRYS 0.6 MG tablet Take 0.6 mg by mouth daily as needed. gout  0  . EPINEPHrine 0.3 mg/0.3 mL IJ SOAJ injection Inject 0.3 mLs (0.3 mg total) into the muscle once. 2 Device 1  . Glucosamine Sulfate (DONA PO) Take 1 tablet by mouth 2 (two) times daily.     Marland Kitchen HYDROcodone-acetaminophen (NORCO/VICODIN) 5-325 MG tablet Take 1-2 tablets by mouth every 6 (six) hours as needed. Foot pain  0  . hydrOXYzine (ATARAX/VISTARIL) 25 MG tablet Take 1 tablet (25 mg total) by mouth at bedtime. 30 tablet 0  . isosorbide mononitrate (IMDUR) 30 MG 24 hr tablet Take 1 tablet (30 mg total) by mouth daily. 30 tablet 12  . metoprolol (LOPRESSOR) 50 MG tablet Take 50 mg by mouth 2 (two) times daily.    . Multiple Vitamin (MULTIVITAMIN) tablet Take 1 tablet by mouth 2 (two) times daily.     . nitroGLYCERIN (NITROSTAT) 0.4 MG SL tablet Place 1 tablet (0.4 mg total) under the tongue every 5 (five) minutes x 3 doses as needed for chest pain. 25  tablet 12  . pravastatin (PRAVACHOL) 40 MG tablet Take 1 tablet (40 mg total) by mouth daily. 30 tablet    No current facility-administered medications for this visit.    Allergies:   Bee venom; Losartan; and Oxycodone   Social History: Social History   Social History  . Marital Status: Married    Spouse Name: N/A  . Number of Children: N/A  . Years of Education: N/A   Occupational History  . Retired Other   Social History Main Topics  . Smoking status: Never Smoker   . Smokeless tobacco: Never Used  . Alcohol Use: 4.2 oz/week    7 Glasses of wine per week     Comment: red wine glass daily  . Drug Use: No  . Sexual Activity: Not on file   Other Topics Concern  . Not on file   Social History Narrative    Family History: Family History  Problem Relation Age of Onset  . Leukemia Mother   . Heart attack Father   . Cancer Sister  Review of Systems: All other systems reviewed and are otherwise negative except as noted above.   Physical Exam: VS:  BP 124/50 mmHg  Pulse 62  Ht 5' 7.5" (1.715 m)  Wt 207 lb 12.8 oz (94.257 kg)  BMI 32.05 kg/m2 , BMI Body mass index is 32.05 kg/(m^2). Wt Readings from Last 3 Encounters:  12/30/14 207 lb 12.8 oz (94.257 kg)  12/24/14 210 lb (95.255 kg)  12/14/14 207 lb 3.2 oz (93.985 kg)    GEN- The patient is well appearing, alert and oriented x 3 today.   HEENT: normocephalic, atraumatic; sclera clear, conjunctiva pink; hearing intact; oropharynx clear; neck supple Lungs- Clear to ausculation bilaterally, normal work of breathing.  No wheezes, rales, rhonchi Heart- Regular rate and rhythm, no murmurs, rubs or gallops, PMI not laterally displaced GI- soft, non-tender, non-distended, bowel sounds present Extremities- no clubbing, cyanosis, or edema; DP/PT/radial pulses 2+ bilaterally MS- no significant deformity or atrophy Skin- warm and dry, no rash or lesion  Psych- euthymic mood, full affect Neuro- strength and sensation  are intact   EKG:  EKG is not ordered today.  Recent Labs: 12/12/2014: TSH 3.195 12/14/2014: ALT 24; BUN 14; Creatinine, Ser 0.96; Hemoglobin 13.3; Platelets 289; Potassium 4.5; Sodium 138    Other studies Reviewed: Additional studies/ records that were reviewed today include: Dr Thurman Coyer office notes  Assessment and Plan: 1.  Paroxysmal atrial fibrillation Clinically maintaining SR on amiodarone (LFT,TSH recently normal, annual eye exams recommended). I have seen Charles Wright is a 79 y.o. male in the office today who has been referred by Dr Charles Wright for a Watchman left atrial appendage closure device.  He has a history of paroxysmal atrial fibrillation.  This patients CHA2DS2-VASc Score and unadjusted Ischemic Stroke Rate (% per year) is equal to 4.8 % stroke rate/year from a score of 4 which necessitates long term oral anticoagulation to prevent stroke. HasBled score is 3.  Modified Rankin Score is 0. Unfortunately, He is not felt to be a long term Warfarin candidate secondary to persistent hematuria.  The patients chart has been reviewed and I along with their referring cardiologist feel that they would be a candidate for short term oral anticoagulation.  Procedural risks for the Watchman implant have been reviewed with the patient including a 1% risk of stroke, 2% risk of perforation, 0.1% risk of device embolization.  Given the patient's poor candidacy for long-term oral anticoagulation, ability to tolerate short term oral anticoagulation, I have recommended the watchman left atrial appendage closure system.  TEE will be scheduled to review LAA anatomy.  The patient understands that the ability to implant Watchman is dependent on results of the TEE.  If patient is candidate for Watchman based on TEE results, we will schedule the procedure at the next available time.     Current medicines are reviewed at length with the patient today.   The patient does not have concerns regarding his medicines.   The following changes were made today:  none  Labs/ tests ordered today include:  Orders Placed This Encounter  Procedures  . Basic Metabolic Panel (BMET)  . CBC w/Diff     Disposition:   Follow up with TEE 11/22 with Dr Debara Pickett, Watchman implant 12/1     Signed, [name], MD  12/30/2014 11:45 AM   [facility] [address] [place] [address] [phone] (office) [phone] (fax)

## 2015-01-09 NOTE — Transfer of Care (Signed)
Immediate Anesthesia Transfer of Care Note  Patient: Charles Wright  Procedure(s) Performed: Procedure(s): LEFT ATRIAL APPENDAGE OCCLUSION (N/A)  Patient Location: Cath Lab  Anesthesia Type:General  Level of Consciousness: awake and alert   Airway & Oxygen Therapy: Patient Spontanous Breathing  Post-op Assessment: Report given to RN  Post vital signs: Reviewed and stable  Last Vitals:  Filed Vitals:   01/09/15 0942 01/09/15 0947  BP:    Pulse: 0 0  Temp:    Resp: 33 0    Complications: No apparent anesthesia complications

## 2015-01-09 NOTE — Anesthesia Procedure Notes (Signed)
Procedure Name: Intubation Date/Time: 01/09/2015 7:53 AM Performed by: Barrington Ellison Pre-anesthesia Checklist: Patient identified, Emergency Drugs available, Suction available, Patient being monitored and Timeout performed Patient Re-evaluated:Patient Re-evaluated prior to inductionOxygen Delivery Method: Circle system utilized Preoxygenation: Pre-oxygenation with 100% oxygen Intubation Type: IV induction Ventilation: Mask ventilation without difficulty and Oral airway inserted - appropriate to patient size Laryngoscope Size: Mac and 3 Grade View: Grade I Tube type: Oral Tube size: 7.5 mm Number of attempts: 1 Airway Equipment and Method: Stylet Placement Confirmation: ETT inserted through vocal cords under direct vision,  positive ETCO2 and breath sounds checked- equal and bilateral Secured at: 21 cm Tube secured with: Tape Dental Injury: Teeth and Oropharynx as per pre-operative assessment

## 2015-01-09 NOTE — Progress Notes (Signed)
Family in to visit. Lunch requested from service response.

## 2015-01-09 NOTE — Anesthesia Postprocedure Evaluation (Signed)
Anesthesia Post Note  Patient: FRANCHOT NASO  Procedure(s) Performed: Procedure(s) (LRB): LEFT ATRIAL APPENDAGE OCCLUSION (N/A)  Patient location during evaluation: PACU Anesthesia Type: General Level of consciousness: awake and alert Pain management: pain level controlled Vital Signs Assessment: post-procedure vital signs reviewed and stable Respiratory status: spontaneous breathing, nonlabored ventilation, respiratory function stable and patient connected to nasal cannula oxygen Cardiovascular status: blood pressure returned to baseline and stable Postop Assessment: no signs of nausea or vomiting Anesthetic complications: no    Last Vitals:  Filed Vitals:   01/09/15 1545 01/09/15 1615  BP: 101/64 99/54  Pulse: 69 65  Temp:    Resp:      Last Pain:  Filed Vitals:   01/09/15 1858  PainSc: 1                  Jaylene Arrowood A

## 2015-01-09 NOTE — Progress Notes (Addendum)
Site area: rt groin fv sheath Site Prior to Removal:  Level 0 Pressure Applied For:  30 minutes Manual:   yes Patient Status During Pull:  stable Post Pull Site:  Level  0 Post Pull Instructions Given:  yes Post Pull Pulses Present: yes Dressing Applied:  tegaderm Bedrest begins @ 1100 Comments:  IV saline locked

## 2015-01-09 NOTE — Discharge Instructions (Signed)
No driving for 3 days. No lifting over 5 lbs for 1 week. No sexual activity for 1 week. You may return to work in 1 week.  Keep procedure site clean & dry. If you notice increased pain, swelling, bleeding or pus, call/return!  You may shower, but no soaking baths/hot tubs/pools for 1 week.  ° ° °

## 2015-01-12 NOTE — Anesthesia Postprocedure Evaluation (Signed)
Anesthesia Post Note  Patient: Charles Wright  Procedure(s) Performed: Procedure(s) (LRB): LEFT ATRIAL APPENDAGE OCCLUSION (N/A)  Patient location during evaluation: Cath Lab Anesthesia Type: General Level of consciousness: awake and awake and alert Pain management: pain level controlled Vital Signs Assessment: post-procedure vital signs reviewed and stable Anesthetic complications: no    Last Vitals:  Filed Vitals:   01/09/15 1545 01/09/15 1615  BP: 101/64 99/54  Pulse: 69 65  Temp:    Resp:      Last Pain:  Filed Vitals:   01/09/15 1858  PainSc: 1                  Latrice Storlie COKER

## 2015-01-14 NOTE — Progress Notes (Signed)
Electrophysiology Office Note Date: 01/15/2015  ID:  Charles Wright, DOB 1935/06/19, MRN [id number]  PCP: Vena Austria, MD Primary Cardiologist: Wynonia Lawman Electrophysiologist: Allred  CC: follow up attempted Watchman implant  Charles Wright is a 79 y.o. male seen today for Dr Rayann Heman. He was evaluated for potential LAA occlusion implant.  Screening TEE demonstrated that the patient's LAA anatomy was potentially smaller than required for device implant. Images were reviewed and I discussed with the patient that the device may not be able to be placed.  He opted to attempt procedure.  He underwent attempted Watchman implant last week but device was unable to be placed 2/2 unfavorable LAA anatomy.  Since discharge, the patient reports doing reasonably well.  He has been maintaining SR by symptoms on amiodarone.  He has had some right groin discomfort but no hematoma or ecchymosis. He denies chest pain, palpitations, dyspnea, PND, orthopnea, nausea, vomiting, dizziness, syncope, edema, weight gain, or early satiety.  Past Medical History  Diagnosis Date  . Hypertension   . Hyperlipidemia   . Gilbert's syndrome   . Wears glasses   . GERD (gastroesophageal reflux disease)     takes  OTC periodically  . Paroxysmal a-fib [facility])     cardiologist-  dr Wynonia Lawman  . OSA (obstructive sleep apnea)     2011 -- POSITIVE STUDY W/ CPAP RX BUT NON-COMPLIANT  . History of kidney stones   . Bladder cancer (HCC)     BCG's tx's  . Prostate cancer [facility])     last PSA  2.9  (montiored by urologist  dr Junious Silk)  . BPH (benign prostatic hypertrophy)   . Paroxysmal atrial flutter ([place])   . CAD (coronary artery disease)     a. NSTEMI 12/2014 -  99% dLAD-2 (not amenable to PCI), 50% dLAD-1, 60% mLAD. Medical therapy was recommended.  . H/O cardiac catheterization     a. Note: Difficult radial access in 12/2014 - recommend femoral approach if cath needed in the future.   Past Surgical History  Procedure  Laterality Date  . Orif left ankle fx  2005  . Bilateral inguinal hernia repair    . Transurethral resection of bladder tumor  10/22/2011    Procedure: TRANSURETHRAL RESECTION OF BLADDER TUMOR (TURBT);  Surgeon: Fredricka Bonine, MD;  Location: [facility];  Service: Urology;  Laterality: N/A;  TURBT, LEFT URETEROSCOPY, POSSIBLE URETERAL STENT   . Ureteroscopy  10/22/2011    Procedure: URETEROSCOPY;  Surgeon: Fredricka Bonine, MD;  Location: [facility];  Service: Urology;  Laterality: Left;  . Prostate biopsy N/A 08/22/2012    Procedure: BIOPSY TRANSRECTAL ULTRASONIC PROSTATE (TUBP);  Surgeon: Fredricka Bonine, MD;  Location: [facility];  Service: Urology;  Laterality: N/A;  . Cystoscopy w/ retrogrades Left 08/22/2012    Procedure: CYSTOSCOPY WITH RETROGRADE PYELOGRAM;  left kidney washings;  Surgeon: Fredricka Bonine, MD;  Location: [facility];  Service: Urology;  Laterality: Left;  . Cystoscopy with biopsy  08/22/2012    Procedure: CYSTOSCOPY WITH BIOPSY;  Surgeon: Fredricka Bonine, MD;  Location: [facility];  Service: Urology;;  . Cystoscopy/retrograde/ureteroscopy Bilateral 08/17/2013    Procedure: CYSTOSCOPY, BLADDER BIOPSY WITH BILATERAL RETROGRADE PYELOGRAM, LEFT URETEROSCOPY AND DILATION OF STRICTURE ;  Surgeon: [name], MD;  Location: [facility];  Service: Urology;  Laterality: Bilateral;  . Lumbar laminectomy/decompression microdiscectomy Left 12/03/2013    Procedure: Left Lumbar three-four diskectomy with  Lumbar four laminectomy ;  Surgeon: Newman Pies, MD;  Location: [place] [facility];  Service: Neurosurgery;  Laterality: Left;  Left Lumbar three-four diskectomy with Lumbar four laminectomy   . Wisdom tooth extraction    . Cystoscopy with retrograde pyelogram, ureteroscopy and stent placement Bilateral 07/30/2014    Procedure: CYSTOSCOPY WITH BLADDER BX  [name],;  Surgeon: [name], MD;  Location: [facility];  Service: Urology;  Laterality: Bilateral;  . Cystoscopy with biopsy N/A 11/08/2014    Procedure: CYSTOSCOPY/BIOPSPY BLADDER INSTILLATION OF MARCAINE AND PYRIDIUM;  Surgeon: [name], MD;  Location: [facility];  Service: Urology;  Laterality: N/A;  . Cardiac catheterization N/A 12/13/2014    Procedure: Left Heart Cath and Coronary Angiography;  Surgeon: Leonie Man, MD;  Location: [place] [facility];  Service: Cardiovascular;  Laterality: N/A;  . Tee without cardioversion N/A 12/31/2014    Procedure: TRANSESOPHAGEAL ECHOCARDIOGRAM (TEE);  Surgeon: Pixie Casino, MD;  Location: [facility];  Service: Cardiovascular;  Laterality: N/A;  . Electrophysiologic study N/A 01/09/2015    Procedure: LEFT ATRIAL APPENDAGE OCCLUSION;  Surgeon: [name], MD;  Location: [place] [facility];  Service: Cardiovascular;  Laterality: N/A;    Current Outpatient Prescriptions  Medication Sig Dispense Refill  . allopurinol (ZYLOPRIM) 300 MG tablet Take 300 mg by mouth every morning.     Marland Kitchen amiodarone (PACERONE) 200 MG tablet Take 200 mg by mouth daily.  0  . aspirin EC 81 MG EC tablet Take 1 tablet (81 mg total) by mouth daily.    . Cholecalciferol (VITAMIN D-3) 1000 UNITS CAPS Take 1,000 Units by mouth 2 (two) times daily.     Marland Kitchen EPINEPHrine 0.3 mg/0.3 mL IJ SOAJ injection Inject 0.3 mLs (0.3 mg total) into the muscle once. 2 Device 1  . Glucosamine Sulfate (DONA PO) Take 1 tablet by mouth 2 (two) times daily.     Marland Kitchen HYDROcodone-acetaminophen (NORCO/VICODIN) 5-325 MG tablet Take 1-2 tablets by mouth every 6 (six) hours as needed. Foot pain  0  . isosorbide mononitrate (IMDUR) 30 MG 24 hr tablet Take 1 tablet (30 mg total) by mouth daily. 30 tablet 12  . metoprolol (LOPRESSOR) 50 MG tablet Take 50 mg by mouth 2 (two) times daily.    . Multiple Vitamin (MULTIVITAMIN) tablet Take 1 tablet by  mouth 2 (two) times daily.     . nitroGLYCERIN (NITROSTAT) 0.4 MG SL tablet Place 1 tablet (0.4 mg total) under the tongue every 5 (five) minutes x 3 doses as needed for chest pain. 25 tablet 12  . pravastatin (PRAVACHOL) 40 MG tablet Take 1 tablet (40 mg total) by mouth daily. 30 tablet    No current facility-administered medications for this visit.    Allergies:   Bee venom; Losartan; and Oxycodone   Social History: Social History   Social History  . Marital Status: Married    Spouse Name: N/A  . Number of Children: N/A  . Years of Education: N/A   Occupational History  . Retired Other   Social History Main Topics  . Smoking status: Never Smoker   . Smokeless tobacco: Never Used  . Alcohol Use: 4.2 oz/week    7 Glasses of wine per week     Comment: red wine glass daily  . Drug Use: No  . Sexual Activity: Not on file   Other Topics Concern  . Not on file   Social History Narrative    Family History: Family History  Problem Relation Age of Onset  . Leukemia Mother   . Heart attack Father   . Cancer Sister   . Hypertension Neg Hx   . Stroke Neg Hx     Review of Systems: All other systems reviewed and are otherwise negative except as noted above.   Physical Exam: VS:  BP 110/70 mmHg  Pulse 60  Ht 5' 7.5" (1.715 m)  Wt 207 lb (93.895 kg)  BMI 31.92 kg/m2  SpO2 92% , BMI Body mass index is 31.92 kg/(m^2). Wt Readings from Last 3 Encounters:  01/15/15 207 lb (93.895 kg)  01/09/15 204 lb (92.534 kg)  12/30/14 207 lb 12.8 oz (94.257 kg)    GEN- The patient is well appearing, alert and oriented x 3 today.   HEENT: normocephalic, atraumatic; sclera clear, conjunctiva pink; hearing intact; oropharynx clear; neck supple Lungs- Clear to ausculation bilaterally, normal work of breathing.  No wheezes, rales, rhonchi Heart- Regular rate and rhythm GI- soft, non-tender, non-distended, bowel sounds present Extremities- no clubbing, cyanosis, or edema; DP/PT/radial  pulses 2+ bilaterally MS- no significant deformity or atrophy Skin- warm and dry, no rash or lesion  Psych- euthymic mood, full affect Neuro- strength and sensation are intact   EKG:  EKG is not ordered today.  Recent Labs: 12/12/2014: TSH 3.195 12/14/2014: ALT 24 12/30/2014: BUN 19; Creat 1.04; Hemoglobin 13.4; Platelets 319; Potassium 4.3; Sodium 137    Other studies Reviewed: Additional studies/ records that were reviewed today include: Dr Thurman Coyer office notes  Assessment and Plan: 1.  Paroxysmal atrial fibrillation Clinically maintaining SR on amiodarone (LFT,TSH recently normal, annual eye exams recommended). Watchman implant unsuccessful 2/2 unfavorable LAA anatomy.  CHADS2VASC is 4.  He is not felt to be a candidate for long term [place].   2.  CAD Recent catheterization with lesions not easily amenable to intervention Tolerating ASA/Plavix without bleeding difficulties (recommendation was for 1 month course following cath 12/2014) No recent ischemic symptoms With cath recommendations for Plavix/ASA, will leave medications as they are for now. The patient will follow up with Dr Wynonia Lawman in 3-4 weeks to discuss medication changes.   Pt seen today with Dr Rayann Heman     Current medicines are reviewed at length with the patient today.   The patient does not have concerns regarding his medicines.  The following changes were made today:  none  Labs/ tests ordered today include: none   Disposition:   Follow up with EP as needed   Signed, Chanetta Marshall, NP  01/15/2015 9:51 AM   [facility] [address] [place] [address] [phone] (office) [phone] (fax)

## 2015-01-15 ENCOUNTER — Ambulatory Visit (INDEPENDENT_AMBULATORY_CARE_PROVIDER_SITE_OTHER): Payer: Medicare Other | Admitting: Nurse Practitioner

## 2015-01-15 ENCOUNTER — Encounter: Payer: Self-pay | Admitting: Nurse Practitioner

## 2015-01-15 VITALS — BP 110/70 | HR 60 | Ht 67.5 in | Wt 207.0 lb

## 2015-01-15 DIAGNOSIS — I480 Paroxysmal atrial fibrillation: Secondary | ICD-10-CM | POA: Diagnosis not present

## 2015-01-15 NOTE — Patient Instructions (Signed)
FOLLOW UP AS NEEDED FOR  ANY CARDIAC  RELATED SYMPTOMS

## 2015-02-24 DIAGNOSIS — C61 Malignant neoplasm of prostate: Secondary | ICD-10-CM | POA: Diagnosis not present

## 2015-02-24 DIAGNOSIS — C44311 Basal cell carcinoma of skin of nose: Secondary | ICD-10-CM | POA: Diagnosis not present

## 2015-02-24 DIAGNOSIS — D1801 Hemangioma of skin and subcutaneous tissue: Secondary | ICD-10-CM | POA: Diagnosis not present

## 2015-02-24 DIAGNOSIS — L812 Freckles: Secondary | ICD-10-CM | POA: Diagnosis not present

## 2015-02-24 DIAGNOSIS — L821 Other seborrheic keratosis: Secondary | ICD-10-CM | POA: Diagnosis not present

## 2015-02-24 DIAGNOSIS — D225 Melanocytic nevi of trunk: Secondary | ICD-10-CM | POA: Diagnosis not present

## 2015-02-24 DIAGNOSIS — Z85828 Personal history of other malignant neoplasm of skin: Secondary | ICD-10-CM | POA: Diagnosis not present

## 2015-03-03 DIAGNOSIS — Z0000 Encounter for general adult medical examination without abnormal findings: Secondary | ICD-10-CM | POA: Diagnosis not present

## 2015-03-03 DIAGNOSIS — C678 Malignant neoplasm of overlapping sites of bladder: Secondary | ICD-10-CM | POA: Diagnosis not present

## 2015-03-17 DIAGNOSIS — H43813 Vitreous degeneration, bilateral: Secondary | ICD-10-CM | POA: Diagnosis not present

## 2015-03-17 DIAGNOSIS — H01001 Unspecified blepharitis right upper eyelid: Secondary | ICD-10-CM | POA: Diagnosis not present

## 2015-03-17 DIAGNOSIS — H01004 Unspecified blepharitis left upper eyelid: Secondary | ICD-10-CM | POA: Diagnosis not present

## 2015-03-17 DIAGNOSIS — H524 Presbyopia: Secondary | ICD-10-CM | POA: Diagnosis not present

## 2015-03-20 DIAGNOSIS — H43813 Vitreous degeneration, bilateral: Secondary | ICD-10-CM | POA: Diagnosis not present

## 2015-04-03 DIAGNOSIS — E785 Hyperlipidemia, unspecified: Secondary | ICD-10-CM | POA: Diagnosis not present

## 2015-04-03 DIAGNOSIS — I2510 Atherosclerotic heart disease of native coronary artery without angina pectoris: Secondary | ICD-10-CM | POA: Diagnosis not present

## 2015-04-03 DIAGNOSIS — E668 Other obesity: Secondary | ICD-10-CM | POA: Diagnosis not present

## 2015-04-03 DIAGNOSIS — G4733 Obstructive sleep apnea (adult) (pediatric): Secondary | ICD-10-CM | POA: Diagnosis not present

## 2015-04-03 DIAGNOSIS — I480 Paroxysmal atrial fibrillation: Secondary | ICD-10-CM | POA: Diagnosis not present

## 2015-04-03 DIAGNOSIS — I10 Essential (primary) hypertension: Secondary | ICD-10-CM | POA: Diagnosis not present

## 2015-04-14 DIAGNOSIS — G4733 Obstructive sleep apnea (adult) (pediatric): Secondary | ICD-10-CM | POA: Diagnosis not present

## 2015-04-15 DIAGNOSIS — C44311 Basal cell carcinoma of skin of nose: Secondary | ICD-10-CM | POA: Diagnosis not present

## 2015-05-01 DIAGNOSIS — G4733 Obstructive sleep apnea (adult) (pediatric): Secondary | ICD-10-CM | POA: Diagnosis not present

## 2015-05-09 ENCOUNTER — Other Ambulatory Visit: Payer: Self-pay | Admitting: Cardiology

## 2015-05-09 DIAGNOSIS — R0600 Dyspnea, unspecified: Secondary | ICD-10-CM

## 2015-05-22 ENCOUNTER — Other Ambulatory Visit: Payer: Self-pay | Admitting: Family Medicine

## 2015-05-22 ENCOUNTER — Ambulatory Visit
Admission: RE | Admit: 2015-05-22 | Discharge: 2015-05-22 | Disposition: A | Discharge status: Home / Self Care | Payer: PPO | Source: Ambulatory Visit | Attending: Family Medicine | Admitting: Family Medicine

## 2015-05-22 DIAGNOSIS — R2243 Localized swelling, mass and lump, lower limb, bilateral: Secondary | ICD-10-CM | POA: Diagnosis not present

## 2015-05-22 DIAGNOSIS — I82403 Acute embolism and thrombosis of unspecified deep veins of lower extremity, bilateral: Secondary | ICD-10-CM

## 2015-05-22 DIAGNOSIS — R600 Localized edema: Secondary | ICD-10-CM | POA: Diagnosis not present

## 2015-05-23 DIAGNOSIS — G4733 Obstructive sleep apnea (adult) (pediatric): Secondary | ICD-10-CM | POA: Diagnosis not present

## 2015-05-26 DIAGNOSIS — R233 Spontaneous ecchymoses: Secondary | ICD-10-CM | POA: Diagnosis not present

## 2015-05-26 DIAGNOSIS — I8001 Phlebitis and thrombophlebitis of superficial vessels of right lower extremity: Secondary | ICD-10-CM | POA: Diagnosis not present

## 2015-05-26 DIAGNOSIS — L03115 Cellulitis of right lower limb: Secondary | ICD-10-CM | POA: Diagnosis not present

## 2015-05-27 DIAGNOSIS — T63441D Toxic effect of venom of bees, accidental (unintentional), subsequent encounter: Secondary | ICD-10-CM | POA: Diagnosis not present

## 2015-05-27 DIAGNOSIS — L509 Urticaria, unspecified: Secondary | ICD-10-CM | POA: Diagnosis not present

## 2015-06-02 DIAGNOSIS — T63461D Toxic effect of venom of wasps, accidental (unintentional), subsequent encounter: Secondary | ICD-10-CM | POA: Diagnosis not present

## 2015-06-02 DIAGNOSIS — T63451D Toxic effect of venom of hornets, accidental (unintentional), subsequent encounter: Secondary | ICD-10-CM | POA: Diagnosis not present

## 2015-06-09 DIAGNOSIS — C678 Malignant neoplasm of overlapping sites of bladder: Secondary | ICD-10-CM | POA: Diagnosis not present

## 2015-06-09 DIAGNOSIS — T63461D Toxic effect of venom of wasps, accidental (unintentional), subsequent encounter: Secondary | ICD-10-CM | POA: Diagnosis not present

## 2015-06-09 DIAGNOSIS — Z0000 Encounter for general adult medical examination without abnormal findings: Secondary | ICD-10-CM | POA: Diagnosis not present

## 2015-06-09 DIAGNOSIS — T63451D Toxic effect of venom of hornets, accidental (unintentional), subsequent encounter: Secondary | ICD-10-CM | POA: Diagnosis not present

## 2015-06-09 DIAGNOSIS — C61 Malignant neoplasm of prostate: Secondary | ICD-10-CM | POA: Diagnosis not present

## 2015-06-13 ENCOUNTER — Other Ambulatory Visit: Payer: Self-pay | Admitting: Urology

## 2015-06-13 ENCOUNTER — Encounter (HOSPITAL_BASED_OUTPATIENT_CLINIC_OR_DEPARTMENT_OTHER): Payer: Self-pay | Admitting: *Deleted

## 2015-06-14 ENCOUNTER — Emergency Department (HOSPITAL_BASED_OUTPATIENT_CLINIC_OR_DEPARTMENT_OTHER): Payer: PPO

## 2015-06-14 ENCOUNTER — Emergency Department (HOSPITAL_BASED_OUTPATIENT_CLINIC_OR_DEPARTMENT_OTHER)
Admission: EM | Admit: 2015-06-14 | Discharge: 2015-06-14 | Disposition: A | Discharge status: Home / Self Care | Payer: PPO | Attending: Emergency Medicine | Admitting: Emergency Medicine

## 2015-06-14 ENCOUNTER — Encounter (HOSPITAL_BASED_OUTPATIENT_CLINIC_OR_DEPARTMENT_OTHER): Payer: Self-pay | Admitting: Emergency Medicine

## 2015-06-14 DIAGNOSIS — R600 Localized edema: Secondary | ICD-10-CM | POA: Diagnosis not present

## 2015-06-14 DIAGNOSIS — M7989 Other specified soft tissue disorders: Secondary | ICD-10-CM | POA: Diagnosis not present

## 2015-06-14 DIAGNOSIS — E785 Hyperlipidemia, unspecified: Secondary | ICD-10-CM | POA: Insufficient documentation

## 2015-06-14 DIAGNOSIS — Z8551 Personal history of malignant neoplasm of bladder: Secondary | ICD-10-CM | POA: Diagnosis not present

## 2015-06-14 DIAGNOSIS — Z79899 Other long term (current) drug therapy: Secondary | ICD-10-CM | POA: Diagnosis not present

## 2015-06-14 DIAGNOSIS — M79604 Pain in right leg: Secondary | ICD-10-CM | POA: Diagnosis not present

## 2015-06-14 DIAGNOSIS — I10 Essential (primary) hypertension: Secondary | ICD-10-CM | POA: Diagnosis not present

## 2015-06-14 DIAGNOSIS — Z8546 Personal history of malignant neoplasm of prostate: Secondary | ICD-10-CM | POA: Diagnosis not present

## 2015-06-14 DIAGNOSIS — M109 Gout, unspecified: Secondary | ICD-10-CM | POA: Insufficient documentation

## 2015-06-14 DIAGNOSIS — R609 Edema, unspecified: Secondary | ICD-10-CM

## 2015-06-14 DIAGNOSIS — M25571 Pain in right ankle and joints of right foot: Secondary | ICD-10-CM | POA: Diagnosis not present

## 2015-06-14 DIAGNOSIS — I480 Paroxysmal atrial fibrillation: Secondary | ICD-10-CM | POA: Insufficient documentation

## 2015-06-14 DIAGNOSIS — I2510 Atherosclerotic heart disease of native coronary artery without angina pectoris: Secondary | ICD-10-CM | POA: Diagnosis not present

## 2015-06-14 DIAGNOSIS — M79661 Pain in right lower leg: Secondary | ICD-10-CM | POA: Diagnosis not present

## 2015-06-14 LAB — CBC WITH DIFFERENTIAL/PLATELET
BASOS PCT: 0 %
Basophils Absolute: 0 10*3/uL (ref 0.0–0.1)
EOS ABS: 0.3 10*3/uL (ref 0.0–0.7)
Eosinophils Relative: 2 %
HEMATOCRIT: 44.9 % (ref 39.0–52.0)
Hemoglobin: 15.5 g/dL (ref 13.0–17.0)
Lymphocytes Relative: 12 %
Lymphs Abs: 1.4 10*3/uL (ref 0.7–4.0)
MCH: 31.1 pg (ref 26.0–34.0)
MCHC: 34.5 g/dL (ref 30.0–36.0)
MCV: 90.2 fL (ref 78.0–100.0)
MONO ABS: 1.9 10*3/uL — AB (ref 0.1–1.0)
MONOS PCT: 16 %
Neutro Abs: 8.4 10*3/uL — ABNORMAL HIGH (ref 1.7–7.7)
Neutrophils Relative %: 70 %
Platelets: 204 10*3/uL (ref 150–400)
RBC: 4.98 MIL/uL (ref 4.22–5.81)
RDW: 15.4 % (ref 11.5–15.5)
WBC: 12 10*3/uL — ABNORMAL HIGH (ref 4.0–10.5)

## 2015-06-14 LAB — BASIC METABOLIC PANEL
Anion gap: 4 — ABNORMAL LOW (ref 5–15)
BUN: 16 mg/dL (ref 6–20)
CALCIUM: 8.7 mg/dL — AB (ref 8.9–10.3)
CO2: 27 mmol/L (ref 22–32)
Chloride: 102 mmol/L (ref 101–111)
Creatinine, Ser: 1.19 mg/dL (ref 0.61–1.24)
GFR calc non Af Amer: 57 mL/min — ABNORMAL LOW (ref >60)
Glucose, Bld: 114 mg/dL — ABNORMAL HIGH (ref 65–99)
Potassium: 5.1 mmol/L (ref 3.5–5.1)
SODIUM: 133 mmol/L — AB (ref 135–145)

## 2015-06-14 LAB — URIC ACID: Uric Acid, Serum: 4.7 mg/dL (ref 4.4–7.6)

## 2015-06-14 LAB — SEDIMENTATION RATE: Sed Rate: 6 mm/hr (ref 0–16)

## 2015-06-14 MED ORDER — HYDROCODONE-ACETAMINOPHEN 5-325 MG PO TABS: 1.0000 | ORAL_TABLET | ORAL | Status: DC | PRN

## 2015-06-14 MED ORDER — PREDNISONE 50 MG PO TABS
60.0000 mg | ORAL_TABLET | Freq: Once | ORAL | Status: AC
  Administered 2015-06-14: 60 mg via ORAL
  Filled 2015-06-14: qty 1

## 2015-06-14 MED ORDER — HYDROCODONE-ACETAMINOPHEN 5-325 MG PO TABS
1.0000 | ORAL_TABLET | Freq: Once | ORAL | Status: AC
  Administered 2015-06-14: 1 via ORAL
  Filled 2015-06-14: qty 1

## 2015-06-14 MED ORDER — PREDNISONE 20 MG PO TABS
20.0000 mg | ORAL_TABLET | Freq: Two times a day (BID) | ORAL | Status: DC
Start: 2015-06-14 — End: 2015-07-08

## 2015-06-14 MED ORDER — COLCHICINE 0.6 MG PO TABS
0.6000 mg | ORAL_TABLET | Freq: Once | ORAL | Status: AC
  Administered 2015-06-14: 0.6 mg via ORAL
  Filled 2015-06-14: qty 1

## 2015-06-14 MED ORDER — NAPROXEN 500 MG PO TABS: 500.0000 mg | ORAL_TABLET | Freq: Two times a day (BID) | ORAL | Status: DC

## 2015-06-14 MED ORDER — COLCHICINE 0.6 MG PO TABS: 0.6000 mg | ORAL_TABLET | Freq: Every day | ORAL | Status: DC

## 2015-06-14 NOTE — ED Provider Notes (Signed)
CSN: [id number]     Arrival date & time 06/14/15  1426 History  By signing my name below, I, [facility], attest that this documentation has been prepared under the direction and in the presence of Tanna Furry, MD. Electronically Signed: Virgel Bouquet, ED Scribe. 06/14/2015. 5:42 PM.  Chief Complaint  Patient presents with  . Leg Pain   The history is provided by the patient. No language interpreter was used.  HPI Comments: Charles Wright is a 80 y.o. male with an hx of gout who presents to the Emergency Department complaining of constant, moderate, gradually worsening right leg pain onset 4 weeks ago. He reports edema of his right lower leg and right foot. He notes an hx of a baker's cyst one year ago that required treatment in the ED. Pain is worse with palpation of the affected area and of the right ankle and right foot. He has taken hydrocodone, most recently shortly PTA with slight relief of pain. He has been seen by his PCP who prescribed him antibiotics and prednisone which he has taken without relief. He states that he has had a [place] to r/o DVT recently. Denies recent injuries and falls. Denies any other symptoms currently.  PCP: Dr. Maury Dus Past Medical History  Diagnosis Date  . Hypertension   . Hyperlipidemia   . Gilbert's syndrome   . Wears glasses   . GERD (gastroesophageal reflux disease)     takes  OTC periodically  . Paroxysmal a-fib [facility])     cardiologist-  dr Wynonia Lawman  . OSA (obstructive sleep apnea)     2011 -- POSITIVE STUDY W/ CPAP RX BUT NON-COMPLIANT  . History of kidney stones   . BPH (benign prostatic hypertrophy)   . Paroxysmal atrial flutter ([place])   . H/O cardiac catheterization     a. Note: Difficult radial access in 12/2014 - recommend femoral approach if cath needed in the future.  Marland Kitchen CAD (coronary artery disease) CARDIOLOGIST-  DR TILLEY    a. NSTEMI 12/2014 -  99% dLAD-2 (not amenable to PCI), 50% dLAD-1, 60% mLAD. Medical therapy was  recommended.  . Bladder cancer ([place])     BCG's tx's  . Prostate cancer [facility])     last PSA  2.9  (montiored by urologist  dr Junious Silk)  . History of non-ST elevation myocardial infarction (NSTEMI)     12-12-2014   CARDIAC CATH W/ NO INTERVENTION   Past Surgical History  Procedure Laterality Date  . Orif left ankle fx  2005  . Bilateral inguinal hernia repair    . Transurethral resection of bladder tumor  10/22/2011    Procedure: TRANSURETHRAL RESECTION OF BLADDER TUMOR (TURBT);  Surgeon: Fredricka Bonine, MD;  Location: [facility];  Service: Urology;  Laterality: N/A;  TURBT, LEFT URETEROSCOPY, POSSIBLE URETERAL STENT   . Ureteroscopy  10/22/2011    Procedure: URETEROSCOPY;  Surgeon: Fredricka Bonine, MD;  Location: [facility];  Service: Urology;  Laterality: Left;  . Prostate biopsy N/A 08/22/2012    Procedure: BIOPSY TRANSRECTAL ULTRASONIC PROSTATE (TUBP);  Surgeon: Fredricka Bonine, MD;  Location: [facility];  Service: Urology;  Laterality: N/A;  . Cystoscopy w/ retrogrades Left 08/22/2012    Procedure: CYSTOSCOPY WITH RETROGRADE PYELOGRAM;  left kidney washings;  Surgeon: Fredricka Bonine, MD;  Location: [facility];  Service: Urology;  Laterality: Left;  . Cystoscopy with biopsy  08/22/2012    Procedure: CYSTOSCOPY WITH BIOPSY;  Surgeon:  Fredricka Bonine, MD;  Location: [facility];  Service: Urology;;  . Cystoscopy/retrograde/ureteroscopy Bilateral 08/17/2013    Procedure: CYSTOSCOPY, BLADDER BIOPSY WITH BILATERAL RETROGRADE PYELOGRAM, LEFT URETEROSCOPY AND DILATION OF STRICTURE ;  Surgeon: [name], MD;  Location: [facility];  Service: Urology;  Laterality: Bilateral;  . Lumbar laminectomy/decompression microdiscectomy Left 12/03/2013    Procedure: Left Lumbar three-four diskectomy with Lumbar four laminectomy ;  Surgeon: Newman Pies, MD;  Location: [place]  NEURO ORS;  Service: Neurosurgery;  Laterality: Left;  Left Lumbar three-four diskectomy with Lumbar four laminectomy   . Wisdom tooth extraction    . Cystoscopy with retrograde pyelogram, ureteroscopy and stent placement Bilateral 07/30/2014    Procedure: CYSTOSCOPY WITH BLADDER BX [place],;  Surgeon: [name], MD;  Location: [facility];  Service: Urology;  Laterality: Bilateral;  . Cystoscopy with biopsy N/A 11/08/2014    Procedure: CYSTOSCOPY/BIOPSPY BLADDER INSTILLATION OF MARCAINE AND PYRIDIUM;  Surgeon: [name], MD;  Location: [facility];  Service: Urology;  Laterality: N/A;  . Tee without cardioversion N/A 12/31/2014    Procedure: TRANSESOPHAGEAL ECHOCARDIOGRAM (TEE);  Surgeon: Pixie Casino, MD;  Location: [facility];  Service: Cardiovascular;  Laterality: N/A;  ef 55-60%/  mild MR, TR, and PR/  mild LAE and RAE  . Electrophysiologic study N/A 01/09/2015    Procedure: LEFT ATRIAL APPENDAGE OCCLUSION;  Surgeon: [name], MD;  Location: [place] CV LAB;  Service: Cardiovascular;  Laterality: N/A;  . Cardiac catheterization N/A 12/13/2014    Procedure: Left Heart Cath and Coronary Angiography;  Surgeon: Leonie Man, MD;  Location: [place] CV LAB;  Service: Cardiovascular;  Laterality: N/A;  culprit lesion diat LAD-2  99%, not approachable via PCTA  due to extremely tortuous up stream LAD/  mLAD 60% and dLAD-1 50%, both are at the extremely tortuous segment and not PCI targets/  otherwise normal coronary arteries   Family History  Problem Relation Age of Onset  . Leukemia Mother   . Heart attack Father   . Cancer Sister   . Hypertension Neg Hx   . Stroke Neg Hx    Social History  Substance Use Topics  . Smoking status: Never Smoker   . Smokeless tobacco: Never Used  . Alcohol Use: 4.2 oz/week    7 Glasses of wine per week     Comment: red wine glass daily    Review of Systems  Constitutional:  Negative for fever, chills, diaphoresis, appetite change and fatigue.  HENT: Negative for mouth sores, sore throat and trouble swallowing.   Eyes: Negative for visual disturbance.  Respiratory: Negative for cough, chest tightness, shortness of breath and wheezing.   Cardiovascular: Positive for leg swelling. Negative for chest pain.  Gastrointestinal: Negative for nausea, vomiting, abdominal pain, diarrhea and abdominal distention.  Endocrine: Negative for polydipsia, polyphagia and polyuria.  Genitourinary: Negative for dysuria, frequency and hematuria.  Musculoskeletal: Positive for myalgias. Negative for gait problem.  Skin: Positive for color change. Negative for pallor and rash.  Neurological: Negative for dizziness, syncope, light-headedness and headaches.  Hematological: Does not bruise/bleed easily.  Psychiatric/Behavioral: Negative for behavioral problems and confusion.      Allergies  Bee venom; Losartan; and Oxycodone  Home Medications   Prior to Admission medications   Medication Sig Start Date End Date Taking? Authorizing Provider  tamsulosin (FLOMAX) 0.4 MG CAPS capsule Take 0.4 mg by mouth.   Yes Historical Provider, MD  allopurinol (ZYLOPRIM) 300 MG  tablet Take 300 mg by mouth every morning.     Historical Provider, MD  amiodarone (PACERONE) 200 MG tablet Take 200 mg by mouth daily. 12/20/14   Historical Provider, MD  aspirin EC 81 MG EC tablet Take 1 tablet (81 mg total) by mouth daily. 12/14/14   Jacolyn Reedy, MD  Cholecalciferol (VITAMIN D-3) 1000 UNITS CAPS Take 1,000 Units by mouth 2 (two) times daily.     Historical Provider, MD  colchicine 0.6 MG tablet Take 1 tablet (0.6 mg total) by mouth daily. 06/14/15   Tanna Furry, MD  EPINEPHrine 0.3 mg/0.3 mL IJ SOAJ injection Inject 0.3 mLs (0.3 mg total) into the muscle once. 08/18/14   Jennifer Piepenbrink, PA-C  Glucosamine Sulfate (DONA PO) Take 1 tablet by mouth 2 (two) times daily.     Historical Provider, MD   HYDROcodone-acetaminophen (NORCO/VICODIN) 5-325 MG tablet Take 1 tablet by mouth every 4 (four) hours as needed. 06/14/15   Tanna Furry, MD  isosorbide mononitrate (IMDUR) 30 MG 24 hr tablet Take 1 tablet (30 mg total) by mouth daily. 12/14/14   Jacolyn Reedy, MD  metoprolol (LOPRESSOR) 50 MG tablet Take 50 mg by mouth 2 (two) times daily.    Historical Provider, MD  Multiple Vitamin (MULTIVITAMIN) tablet Take 1 tablet by mouth 2 (two) times daily.     Historical Provider, MD  naproxen (NAPROSYN) 500 MG tablet Take 1 tablet (500 mg total) by mouth 2 (two) times daily. 06/14/15   Tanna Furry, MD  nitroGLYCERIN (NITROSTAT) 0.4 MG SL tablet Place 1 tablet (0.4 mg total) under the tongue every 5 (five) minutes x 3 doses as needed for chest pain. 12/14/14   Jacolyn Reedy, MD  pravastatin (PRAVACHOL) 40 MG tablet Take 1 tablet (40 mg total) by mouth daily. 12/14/14   Jacolyn Reedy, MD  predniSONE (DELTASONE) 20 MG tablet Take 1 tablet (20 mg total) by mouth 2 (two) times daily with a meal. 06/14/15   Tanna Furry, MD   BP 103/69 mmHg  Pulse 66  Temp(Src) 97.9 F (36.6 C) (Oral)  Resp 20  Ht 5' 7.5" (1.715 m)  Wt 208 lb (94.348 kg)  BMI 32.08 kg/m2  SpO2 98% Physical Exam  Constitutional: He is oriented to person, place, and time. He appears well-developed and well-nourished. No distress.  HENT:  Head: Normocephalic.  Eyes: Conjunctivae are normal. Pupils are equal, round, and reactive to light. No scleral icterus.  Neck: Normal range of motion. Neck supple. No thyromegaly present.  Cardiovascular: Normal rate and regular rhythm.  Exam reveals no gallop and no friction rub.   No murmur heard. Pulmonary/Chest: Effort normal and breath sounds normal. No respiratory distress. He has no wheezes. He has no rales.  Abdominal: Soft. Bowel sounds are normal. He exhibits no distension. There is no tenderness. There is no rebound.  Musculoskeletal: Normal range of motion. He exhibits edema.  Soft tissue to  the distal third and palpable joint effusion of the RLE. Chronic erythema per pt. No STS popliteal fossa. No cords.  Neurological: He is alert and oriented to person, place, and time.  Skin: Skin is warm and dry. No rash noted. There is erythema.  Psychiatric: He has a normal mood and affect. His behavior is normal.    ED Course  Procedures   DIAGNOSTIC STUDIES: Oxygen Saturation is 98% on RA, normal by my interpretation.    COORDINATION OF CARE: 3:48 PM Will order labs, vascular US of RLE, and x-rays of  right ankle and right tibia/fibula. Discussed treatment plan with pt at bedside and pt agreed to plan.  Labs Review Labs Reviewed  CBC WITH DIFFERENTIAL/PLATELET - Abnormal; Notable for the following:    WBC 12.0 (*)    Neutro Abs 8.4 (*)    Monocytes Absolute 1.9 (*)    All other components within normal limits  BASIC METABOLIC PANEL - Abnormal; Notable for the following:    Sodium 133 (*)    Glucose, Bld 114 (*)    Calcium 8.7 (*)    GFR calc non Af Amer 57 (*)    Anion gap 4 (*)    All other components within normal limits  SEDIMENTATION RATE  URIC ACID    Imaging Review Dg Tibia/fibula Right  06/14/2015  CLINICAL DATA:  Right lower leg pain for 4 weeks.  No known injury. EXAM: RIGHT TIBIA AND FIBULA - 2 VIEW COMPARISON:  None. FINDINGS: Soft tissue swelling is noted. There is no evidence of acute fracture, subluxation or dislocation. Mild degenerative changes in the knee identified. IMPRESSION: Soft tissue swelling without acute bony abnormality. Electronically Signed   By: Margarette Canada M.D.   On: 06/14/2015 17:25   Dg Ankle Complete Right  06/14/2015  CLINICAL DATA:  Pain without trauma. EXAM: RIGHT ANKLE - COMPLETE 3+ VIEW COMPARISON:  None. FINDINGS: Diffuse soft tissue swelling is seen. No fracture or dislocation. No bony erosion or soft tissue gas is seen. Vascular calcifications. IMPRESSION: Soft tissue swelling.  No other abnormalities. Electronically Signed   By: Dorise Bullion III M.D   On: 06/14/2015 17:19   US Venous Img Lower Unilateral Right  06/14/2015  CLINICAL DATA:  80 year old male with right lower extremity pain and swelling for 1 month. History of bladder cancer. EXAM: RIGHT LOWER EXTREMITY VENOUS DOPPLER ULTRASOUND TECHNIQUE: Gray-scale sonography with graded compression, as well as color Doppler and duplex ultrasound were performed to evaluate the lower extremity deep venous systems from the level of the common femoral vein and including the common femoral, femoral, profunda femoral, popliteal and calf veins including the posterior tibial, peroneal and gastrocnemius veins when visible. The superficial great saphenous vein was also interrogated. Spectral Doppler was utilized to evaluate flow at rest and with distal augmentation maneuvers in the common femoral, femoral and popliteal veins. COMPARISON:  None. FINDINGS: Deep venous system appears patent and compressible from groin through popliteal fossae. Spontaneous venous flow present with evidence of respiratory phasicity. Augmentation intact. No intraluminal thrombus identified. Visualized portions of the greater saphenous veins patent. IMPRESSION: No evidence of deep venous thrombosis. Electronically Signed   By: Margarette Canada M.D.   On: 06/14/2015 17:37   I have personally reviewed and evaluated these images and lab results as part of my medical decision-making.   MDM   Final diagnoses:  Swelling  Acute gout of left ankle, unspecified cause    Not febrile. Does have mild cytosis and normal uric acid. Sedimentation rate elevated. No acute abdomen X-RAY. NORMAL ULTRASOUND. THINGS VERY LIKELY AN EXACERBATION OF ACUTE GOUTY ARTHRITIS AS HE CLINICALLY HAS ANKLE JOINT EFFUSION. PLAN PREDNISONE, COLCHICINE, VICODIN. HE IS HAVING A RENAL BIOPSY NEXT WEDNESDAY. AS CONTROL OFF ON ANTI-INFLAMMATORIES UNTIL AFTER THE COMPLETION OF THE PROCEDURE.  I personally performed the services described in this  documentation, which was scribed in my presence. The recorded information has been reviewed and is accurate.    Tanna Furry, MD 06/14/15 [phone]

## 2015-06-14 NOTE — Discharge Instructions (Signed)

## 2015-06-14 NOTE — ED Notes (Signed)
Pt states he took hydrocodone tab x 2, last dose at 1200hrs today

## 2015-06-14 NOTE — ED Notes (Signed)
Presents with 4 week hx of RLE pain, states began with a sm knot at rt tibial area, now presents with increased pain, difficulty standing and walking on RLE. RLE noted to be swollen, red and tender to touch, warm to touch.

## 2015-06-14 NOTE — ED Notes (Signed)
Pt reports that there was a baker cyst that was drained in that area about a year ago. On off troubles with leg since that time. Has had ultrasound and r/o dvt.

## 2015-06-14 NOTE — ED Notes (Signed)
Patient states that he has had leg and shin pain x 4 weeks, reports that his right calf was checked for DVT and he had a history of gout. He has gone to him PMD for recheck but today the pain is so swollen and painful that he can not walk

## 2015-06-14 NOTE — ED Notes (Signed)
1+ pitting edema noted at rt ankle area.

## 2015-06-16 ENCOUNTER — Encounter (HOSPITAL_BASED_OUTPATIENT_CLINIC_OR_DEPARTMENT_OTHER): Payer: Self-pay | Admitting: *Deleted

## 2015-06-16 DIAGNOSIS — T63451D Toxic effect of venom of hornets, accidental (unintentional), subsequent encounter: Secondary | ICD-10-CM | POA: Diagnosis not present

## 2015-06-16 DIAGNOSIS — T63461D Toxic effect of venom of wasps, accidental (unintentional), subsequent encounter: Secondary | ICD-10-CM | POA: Diagnosis not present

## 2015-06-16 NOTE — Progress Notes (Addendum)
NPO AFTER MN.  ARRIVE AT 0930.  CURRENT LAB RESULTS AND EKG IN CHART AND EPIC.  WILL TAKE AM MEDS W/ SIPS OF WATER DOS.   REVIEWED CHART W/ DR Stanton Kidney JUDD MDA,  STATES NEED TO LOOK AT LAST CARDIOLOGIST NOTE , DR Wynonia Lawman DUE TO NOTE FROM DR Rayann Heman IN DEC 2016 THAT PT ON ASA AND PLAVIX BUT PER PT IS NOT ON PLAVIX ONLY ASA AND STOPPED 06-12-2015 PER DR ESKRIDGE OFFICE.  HAD ALREADY CALLED DR Wynonia Lawman FOR LOV NOTE AND EKG, WAITING FOR FAX AND WILL REVIEW W/ MDA.  RECEIVED LOV NOTE FROM DR Wynonia Lawman AND REVIEWED W/ DR MARY JUDD MDA,  STATES NEED TO OK/CLEARANCE FROM DR Wynonia Lawman, CARDIOLOGIST, THAT OK TO HAVE BEEN OFF PLAVIX AND STOPPED ASA 81MG  FOR SURGERY 06-17-2015.  OFFICE CLOSED UNTIL 1330 WILL CALL BACK THEN AND ALSO LM FOR PAM , OR SCHEDULER FOR DR Alma, TO CALL BACK.  CALLED AND SPOKE W/ PT TO VERIFY IF HE TAKES PLAVIX OR HAD STOPPED SINCE DR TILLEY HAD THIS ON HIS NOTE.  PT STATES HE DOES TAKE CLOPIDOGREL , WHICH IS GENERIC FOR PLAVIX.  STATED THE HE TAKES THIS DAILY IN AM , HAD HIS DOSE TODAY, AND HAD STOPPED ASA THURS 06-12-2015 AS TOLD TO HIM  BY OFFICE.  SPOKE BACK W/ DR MARY JUDD, MDA STATES OK TO PROCEED BUT TO CHECK W/ DR ESKRIDGE IF OK W/ HIM THE PT STILL TAKING PLAVIX.    SPOKE W/ PAM, OR SCHEDULER AT OFFICE,  STATED THAT PER DR ESKRIDGE HAD NOT MARKED THAT NEEDED CLEARANCE TO STOP PLAVIX. PAM TO SPEAK TO DR ESKRIDGE AND CALL BACK.  PAM CALLED BACK AND STATED DR ESKRIDGE WOULD WANT PT TO STOP PLAVIX AND CASE WILL RESCHEDULE FOR NEXT WEEK AFTER CARDIAC CLEARANCE TO STOP.  PT WAS RESCHEDULED FOR 07-08-2015 SINCE DR ESKRIDGE NEEDED PT OFF PLAVIX AND PT HAS RECEIVED CLEARANCE FROM DR Wynonia Lawman, CARDIOLOGIST, TO STOP PLAVIX 5 DAYS PRIOR TO DOS BUT TO CONTINUE ASA.  SPOKE [place] PHONE.  HE VERBALIZED UNDERSTANDING Thursday 07-03-2015 IS LAST DAY TO TAKE PLAVIX AND TO CONTINUE ASA.  PT TO ARRIVE AT 0915.  CURRENT LAB RESULTS AND EKG IN CHART AND EPIC.  WILL TAKE AM MEDS DOS W/ SIPS OF WATER .

## 2015-06-16 NOTE — H&P (Signed)
History of Present Illness       F/u- PCP DR. [facility] - Dr. Wynonia Lawman       1- bladder cancer - Sep 2016 bbx's benign; June 2016 - CIS (posterior erythema), dome benign; Jul 2015 CIS right bladder; Sept 2013 HGTa right bladder, left UO   Last upper tract: Jun 2016 - RGP's; Jul 2015 RGP's; Mar 2015 CT Hematuria  Last BCG: Aug 2016 completed BCG 3 / 6 - severe LUTs - sent to ID poss bcg cystitis. Bx = inflammation.   Last cystoscopy: Jan 2017 - posterior erythema much improved  Last cytology: Jan 2017 atypical       2-Prostate cancer/BPH/prostate nodule  -July 2013 PSA 3.06  -Sep 2013 normal DRE under anesthesia  -May 2014 PSA 3.8, nodule right prostate  -Jul 2014 DRE - right apical nodule (visible on U/S - hyperechoic, shadowing)  TRUS postate bx (apical nodule directly samples in right apical bx) - core looked different - dark, stone-like material   42 gram prostate  right apical nodule on DRE  Right base atypical small acinar proliferation  Right mid 3+3=6, 15%   Right apex - benign with inflammation  -Mar 2015 PSA 2.86  -Jul 2015 - Exam under anesthesia - normal DRE, benign prostate  -Jan 2016 PSA 2.69   -Jun 2016 - nl DRE/EUA, PSA 2.96  -Sep 2016 - nl DRE/EUA   -Jan 2017 PSA 3.4      3-left ureteral stricture -   -May 2010 - atypical cytology, CT scan negative for hydronephrosis but a little dilated distal ureter was noted. Cystoscopy, bilateral RGP - finding of a LEFT ureteral stricture about 2-3 cm just below the pelvic brim on the left side. There was no hydronephrosis. URS not successful. He was stented.  -Apr 2010 repeat left URS - dilated, but still scope would not pass proximal to stricture. Brush biopsy of this area was negative. Larger stent was placed and removed.   -July 2010 IVP - normal and showed no hydronephrosis or abnormality of the left distal ureter.   -Jan 2011 CT scan normal, no hydronephrosis, and the hyperechoic  area of the left kidney was a benign column of Bertin. Cytology was negative July 2010.   -Sept 2013 left distal ureteral stricture seen on URS, but no mass (URS done at time of bladder Ca removal).   -August 08, 2013 patient had left flank pain nausea and vomiting. White count 11.4, BUN 18, creatinine 1.1, UA - 40 red blood cells, 58 white blood cells, no bacteria. Afebrile. Treated with Cipro. CT scan showed stranding around the left peripelvic fat and left ureter with mild ureteral dilation. There were no stones.      4-LUTS - Aug 2016 - started on Bactrim last month for urinary frequency/urgency and dysuria from clinic in [place]. Urine Cx repeat here was negative. His PVR was 008 ml. He developed symptoms after the third BCG. He continues to have frequency, urgency and dysuria. Uribel did not help. UA few bacteria, > 60 wbc, 3-10 rbc. urine Cx negative. Tx with Cipro.         May 2017 int hx   Patient returns in continuing management of prostate cancer and bladder cancer. He's felt the best he has" 2 years". He is voiding with a good stream and has no frequency or urgency. He's had no dysuria or gross hematuria. No flank pain. I checked his PSA in January and it returned at 3.4. Stable.  Past Medical History Problems  1. History of Abdominal pain, LLQ (left lower quadrant) (R10.32) 2. History of kidney stones (Z87.442) 3. History of Microscopic hematuria (R31.29) 4. History of Nephrolithiasis Of The Left Kidney 5. History of Ureteral stricture (N13.5)  Surgical History Problems  1. History of Back Surgery 2. History of Cystoscopy With Biopsy 3. History of Cystoscopy With Fulguration Minor Lesion (Under 68mm) 4. History of Cystoscopy With Fulguration Minor Lesion (Under 78mm) 5. History of Cystoscopy With Fulguration Minor Lesion (Under 31mm) 6. History of Cystoscopy With Fulguration Minor Lesion (Under 80mm) 7. History of Cystoscopy With Fulguration Small Lesion  (5-23mm) 8. History of Cystoscopy With Insertion Of Ureteral Stent Left 9. History of Cystoscopy With Insertion Of Ureteral Stent Left 10. History of Cystoscopy With Insertion Of Ureteral Stent Left 11. History of Cystoscopy With Ureteral Cath And Brush Biopsy Left 12. History of Cystoscopy With Ureteroscopy For Biopsy Left 13. History of Cystoscopy With Ureteroscopy Left 14. History of Cystourethroscopy With Treatment Of Ureteral Stricture 15. History of Leg Repair 16. History of Needle Biopsy Of Prostate 17. History of Oral Surgery  Current Meds 1. Allopurinol TABS;  Therapy: (Recorded:23Jan2017) to Recorded 2. BCG Instillation; BCG half dose today; To Be Done: [id number]; Status: HOLD FOR -  Administration Ordered 3. BCG Instillation; BCG today full dose; To Be Done: [id number]; Status: HOLD FOR -  Administration Ordered 4. Clopidogrel Bisulfate TABS;  Therapy: ([id number]) to Recorded 5. EpiPen 0.3 MG/0.3ML DEVI;  Therapy: [phone] to Recorded 6. Glucosamine CAPS;  Therapy: ([id number]) to Recorded 7. Isosorbide Mononitrate TABS;  Therapy: ([id number]) to Recorded 8. Metoprolol Tartrate TABS;  Therapy: (Recorded:27May2016) to Recorded 9. Multi-Vitamin/Iron TABS;  Therapy: ([id number]) to Recorded 10. Pravastatin Sodium TABS;   Therapy: (Recorded:23Jan2017) to Recorded 11. Vitamin D TABS;   Therapy: (Recorded:21Jul2014) to Recorded  Allergies Medication  1. losartan 2. OxyCODONE HCl TABS Non-Medication  3. Bee sting  Family History Problems  1. Family history of Death In The Family Father   Deceased at age 40 2. Family history of Death In The Family Mother   Deceased at age 43 3. No pertinent family history : Sibling  Social History Problems  1. Alcohol Use (History)   2 daily 2. Caffeine Use   2 daily 3. Marital History - Currently Married 4. 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 Denied: History of  Tobacco Use  Vitals Vital Signs [Data Includes: Last 1 Day]  Recorded: [id number] 10:44AM  Blood Pressure: 145 / 77 Temperature: 97.6 F Heart Rate: 61  Physical Exam Abdomen: The abdomen is soft and nontender. No masses are palpated. No CVA tenderness. No hernias are palpable. No hepatosplenomegaly noted.  Genitourinary: Examination of the penis demonstrates no discharge, no masses, no lesions and a normal meatus. The scrotum is without lesions. The right epididymis is palpably normal and non-tender. The left epididymis is palpably normal and non-tender. The right testis is non-tender and without masses. The left testis is non-tender and without masses.    Results/Data Urine [Data Includes: Last 1 Day]   [id number]  COLOR YELLOW   APPEARANCE CLEAR   SPECIFIC GRAVITY 1.020   pH 6.0   GLUCOSE NEGATIVE   BILIRUBIN NEGATIVE   KETONE NEGATIVE   BLOOD NEGATIVE   PROTEIN NEGATIVE   NITRITE NEGATIVE   LEUKOCYTE ESTERASE NEGATIVE    Procedure  Procedure: Cystoscopy   Informed Consent: Risks, benefits, and potential adverse events were discussed and informed consent was obtained from the patient.  Prep:  The patient was prepped with betadine.  Procedure Note:  Urethral meatus:. No abnormalities.  Anterior urethra: No abnormalities.  Prostatic urethra: No abnormalities.  Bladder: Visulization was clear. The ureteral orifices were in the normal anatomic position bilaterally and had clear efflux of urine. A systematic survey of the bladder demonstrated no bladder tumors or stones. The mucosa was smooth without abnormalities. Mild erythema at dome. The patient tolerated the procedure well.  Complications: None.    Assessment Assessed  1. Prostate cancer (C61) 2. Malignant neoplasm of overlapping sites of bladder (C67.8)  Plan Benign prostatic hyperplasia with urinary obstruction, Bladder pain, Dysuria, Health Maintenance, Prostate cancer  1. PSA; Status:Hold For - Specimen/Data  Collection,Appointment; Requested  for:01May2017;  Benign prostatic hyperplasia with urinary obstruction, Dysuria, Health Maintenance, Prostate cancer  2. Follow-up Month x 6 Office  Follow-up  Status: Hold For - Appointment,Date of Service   Requested for: [id number] Health Maintenance  3. UA With REFLEX; [Do Not Release]; Status:Complete;   [id number] 10:35AM Malignant neoplasm of overlapping sites of bladder  4. Cysto; Status:Hold For - Appointment,Date of Service; Requested for:01May2017;  5. URINE CYTOLOGY; Status:Hold For - Specimen/Data Collection,Appointment;  Requested for:01May2017;   Discussion/Summary Bladder cancer-urine cytology sent. Cystoscopy was benign today apart from very mild erythema at dome.     Prostate cancer-check PSA in 6 months before f/u.           Signatures Electronically signed by : [name], M.D.; Jun 09 2015 11:03AM EST  Add: Cytology suspicious.  Plan cystoscopy with bladder biopsy, some may be random.  Retrogrades and renal washes.

## 2015-06-18 ENCOUNTER — Ambulatory Visit (INDEPENDENT_AMBULATORY_CARE_PROVIDER_SITE_OTHER): Payer: PPO | Admitting: Internal Medicine

## 2015-06-18 DIAGNOSIS — R0600 Dyspnea, unspecified: Secondary | ICD-10-CM

## 2015-06-18 DIAGNOSIS — Z630 Problems in relationship with spouse or partner: Secondary | ICD-10-CM | POA: Diagnosis not present

## 2015-06-18 DIAGNOSIS — F4323 Adjustment disorder with mixed anxiety and depressed mood: Secondary | ICD-10-CM | POA: Diagnosis not present

## 2015-06-18 LAB — PULMONARY FUNCTION TEST
DL/VA % pred: 89 %
DL/VA: 3.97 ml/min/mmHg/L
DLCO COR % PRED: 81 %
DLCO COR: 23.62 ml/min/mmHg
DLCO unc % pred: 85 %
DLCO unc: 24.74 ml/min/mmHg
FEF 25-75 POST: 2.46 L/s
FEF 25-75 Pre: 2.3 L/sec
FEF2575-%Change-Post: 6 %
FEF2575-%PRED-POST: 134 %
FEF2575-%PRED-PRE: 125 %
FEV1-%CHANGE-POST: 1 %
FEV1-%PRED-POST: 118 %
FEV1-%Pred-Pre: 116 %
FEV1-POST: 3.11 L
FEV1-Pre: 3.08 L
FEV1FVC-%CHANGE-POST: 5 %
FEV1FVC-%PRED-PRE: 104 %
FEV6-%Change-Post: -2 %
FEV6-%Pred-Post: 114 %
FEV6-%Pred-Pre: 117 %
FEV6-POST: 3.91 L
FEV6-Pre: 4.02 L
FEV6FVC-%Change-Post: 0 %
FEV6FVC-%Pred-Post: 106 %
FEV6FVC-%Pred-Pre: 106 %
FVC-%CHANGE-POST: -3 %
FVC-%PRED-POST: 106 %
FVC-%PRED-PRE: 110 %
FVC-POST: 3.94 L
FVC-PRE: 4.09 L
POST FEV1/FVC RATIO: 79 %
PRE FEV1/FVC RATIO: 75 %
PRE FEV6/FVC RATIO: 99 %
Post FEV6/FVC ratio: 99 %
RV % pred: 77 %
RV: 1.93 L
TLC % PRED: 98 %
TLC: 6.46 L

## 2015-06-18 NOTE — Progress Notes (Signed)
PFT done today. 

## 2015-06-22 DIAGNOSIS — G4733 Obstructive sleep apnea (adult) (pediatric): Secondary | ICD-10-CM | POA: Diagnosis not present

## 2015-06-23 DIAGNOSIS — T63461D Toxic effect of venom of wasps, accidental (unintentional), subsequent encounter: Secondary | ICD-10-CM | POA: Diagnosis not present

## 2015-06-23 DIAGNOSIS — T63451D Toxic effect of venom of hornets, accidental (unintentional), subsequent encounter: Secondary | ICD-10-CM | POA: Diagnosis not present

## 2015-06-27 ENCOUNTER — Encounter (HOSPITAL_BASED_OUTPATIENT_CLINIC_OR_DEPARTMENT_OTHER): Payer: Self-pay | Admitting: *Deleted

## 2015-07-04 DIAGNOSIS — T63451D Toxic effect of venom of hornets, accidental (unintentional), subsequent encounter: Secondary | ICD-10-CM | POA: Diagnosis not present

## 2015-07-04 DIAGNOSIS — T63461D Toxic effect of venom of wasps, accidental (unintentional), subsequent encounter: Secondary | ICD-10-CM | POA: Diagnosis not present

## 2015-07-07 NOTE — H&P (Signed)
History of Present Illness       F/u- PCP DR. [facility] - Dr. Wynonia Lawman       1- bladder cancer - Sep 2016 bbx's benign; June 2016 - CIS (posterior erythema), dome benign; Jul 2015 CIS right bladder; Sept 2013 HGTa right bladder, left UO   Last upper tract: Jun 2016 - RGP's; Jul 2015 RGP's; Mar 2015 CT Hematuria  Last BCG: Aug 2016 completed BCG 3 / 6 - severe LUTs - sent to ID poss bcg cystitis. Bx = inflammation.   Last cystoscopy: Jan 2017 - posterior erythema much improved  Last cytology: Jan 2017 atypical       2-Prostate cancer/BPH/prostate nodule  -July 2013 PSA 3.06  -Sep 2013 normal DRE under anesthesia  -May 2014 PSA 3.8, nodule right prostate  -Jul 2014 DRE - right apical nodule (visible on U/S - hyperechoic, shadowing)  TRUS postate bx (apical nodule directly samples in right apical bx) - core looked different - dark, stone-like material   42 gram prostate  right apical nodule on DRE  Right base atypical small acinar proliferation  Right mid 3+3=6, 15%   Right apex - benign with inflammation  -Mar 2015 PSA 2.86  -Jul 2015 - Exam under anesthesia - normal DRE, benign prostate  -Jan 2016 PSA 2.69   -Jun 2016 - nl DRE/EUA, PSA 2.96  -Sep 2016 - nl DRE/EUA   -Jan 2017 PSA 3.4      3-left ureteral stricture -   -May 2010 - atypical cytology, CT scan negative for hydronephrosis but a little dilated distal ureter was noted. Cystoscopy, bilateral RGP - finding of a LEFT ureteral stricture about 2-3 cm just below the pelvic brim on the left side. There was no hydronephrosis. URS not successful. He was stented.  -Apr 2010 repeat left URS - dilated, but still scope would not pass proximal to stricture. Brush biopsy of this area was negative. Larger stent was placed and removed.   -July 2010 IVP - normal and showed no hydronephrosis or abnormality of the left distal ureter.   -Jan 2011 CT scan normal, no hydronephrosis, and the hyperechoic  area of the left kidney was a benign column of Bertin. Cytology was negative July 2010.   -Sept 2013 left distal ureteral stricture seen on URS, but no mass (URS done at time of bladder Ca removal).   -August 08, 2013 patient had left flank pain nausea and vomiting. White count 11.4, BUN 18, creatinine 1.1, UA - 40 red blood cells, 58 white blood cells, no bacteria. Afebrile. Treated with Cipro. CT scan showed stranding around the left peripelvic fat and left ureter with mild ureteral dilation. There were no stones.      4-LUTS - Aug 2016 - started on Bactrim last month for urinary frequency/urgency and dysuria from clinic in [place]. Urine Cx repeat here was negative. His PVR was 008 ml. He developed symptoms after the third BCG. He continues to have frequency, urgency and dysuria. Uribel did not help. UA few bacteria, > 60 wbc, 3-10 rbc. urine Cx negative. Tx with Cipro.         May 2017 int hx   Patient returns in continuing management of prostate cancer and bladder cancer. He's felt the best he has" 2 years". He is voiding with a good stream and has no frequency or urgency. He's had no dysuria or gross hematuria. No flank pain. I checked his PSA in January and it returned at 3.4. Stable.  Past Medical History Problems  1. History of Abdominal pain, LLQ (left lower quadrant) (R10.32) 2. History of kidney stones (Z87.442) 3. History of Microscopic hematuria (R31.29) 4. History of Nephrolithiasis Of The Left Kidney 5. History of Ureteral stricture (N13.5)  Surgical History Problems  1. History of Back Surgery 2. History of Cystoscopy With Biopsy 3. History of Cystoscopy With Fulguration Minor Lesion (Under 25mm) 4. History of Cystoscopy With Fulguration Minor Lesion (Under 69mm) 5. History of Cystoscopy With Fulguration Minor Lesion (Under 74mm) 6. History of Cystoscopy With Fulguration Minor Lesion (Under 17mm) 7. History of Cystoscopy With Fulguration Small Lesion  (5-9mm) 8. History of Cystoscopy With Insertion Of Ureteral Stent Left 9. History of Cystoscopy With Insertion Of Ureteral Stent Left 10. History of Cystoscopy With Insertion Of Ureteral Stent Left 11. History of Cystoscopy With Ureteral Cath And Brush Biopsy Left 12. History of Cystoscopy With Ureteroscopy For Biopsy Left 13. History of Cystoscopy With Ureteroscopy Left 14. History of Cystourethroscopy With Treatment Of Ureteral Stricture 15. History of Leg Repair 16. History of Needle Biopsy Of Prostate 17. History of Oral Surgery  Current Meds 1. Allopurinol TABS;  Therapy: (Recorded:23Jan2017) to Recorded 2. BCG Instillation; BCG half dose today; To Be Done: [id number]; Status: HOLD FOR -  Administration Ordered 3. BCG Instillation; BCG today full dose; To Be Done: [id number]; Status: HOLD FOR -  Administration Ordered 4. Clopidogrel Bisulfate TABS;  Therapy: ([id number]) to Recorded 5. EpiPen 0.3 MG/0.3ML DEVI;  Therapy: [phone] to Recorded 6. Glucosamine CAPS;  Therapy: ([id number]) to Recorded 7. Isosorbide Mononitrate TABS;  Therapy: ([id number]) to Recorded 8. Metoprolol Tartrate TABS;  Therapy: (Recorded:27May2016) to Recorded 9. Multi-Vitamin/Iron TABS;  Therapy: ([id number]) to Recorded 10. Pravastatin Sodium TABS;   Therapy: (Recorded:23Jan2017) to Recorded 11. Vitamin D TABS;   Therapy: (Recorded:21Jul2014) to Recorded  Allergies Medication  1. losartan 2. OxyCODONE HCl TABS Non-Medication  3. Bee sting  Family History Problems  1. Family history of Death In The Family Father   Deceased at age 15 2. Family history of Death In The Family Mother   Deceased at age 60 3. No pertinent family history : Sibling  Social History Problems  1. Alcohol Use (History)   2 daily 2. Caffeine Use   2 daily 3. Marital History - Currently Married 4. 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 Denied: History of  Tobacco Use  Vitals Vital Signs [Data Includes: Last 1 Day]  Recorded: [id number] 10:44AM  Blood Pressure: 145 / 77 Temperature: 97.6 F Heart Rate: 61  Physical Exam Abdomen: The abdomen is soft and nontender. No masses are palpated. No CVA tenderness. No hernias are palpable. No hepatosplenomegaly noted.  Genitourinary: Examination of the penis demonstrates no discharge, no masses, no lesions and a normal meatus. The scrotum is without lesions. The right epididymis is palpably normal and non-tender. The left epididymis is palpably normal and non-tender. The right testis is non-tender and without masses. The left testis is non-tender and without masses.    Results/Data Urine [Data Includes: Last 1 Day]   [id number]  COLOR YELLOW   APPEARANCE CLEAR   SPECIFIC GRAVITY 1.020   pH 6.0   GLUCOSE NEGATIVE   BILIRUBIN NEGATIVE   KETONE NEGATIVE   BLOOD NEGATIVE   PROTEIN NEGATIVE   NITRITE NEGATIVE   LEUKOCYTE ESTERASE NEGATIVE    Procedure  Procedure: Cystoscopy   Informed Consent: Risks, benefits, and potential adverse events were discussed and informed consent was obtained from the patient.  Prep:  The patient was prepped with betadine.  Procedure Note:  Urethral meatus:. No abnormalities.  Anterior urethra: No abnormalities.  Prostatic urethra: No abnormalities.  Bladder: Visulization was clear. The ureteral orifices were in the normal anatomic position bilaterally and had clear efflux of urine. A systematic survey of the bladder demonstrated no bladder tumors or stones. The mucosa was smooth without abnormalities. Mild erythema at dome. The patient tolerated the procedure well.  Complications: None.    Assessment Assessed  1. Prostate cancer (C61) 2. Malignant neoplasm of overlapping sites of bladder (C67.8)  Plan Benign prostatic hyperplasia with urinary obstruction, Bladder pain, Dysuria, Health Maintenance, Prostate cancer  1. PSA; Status:Hold For - Specimen/Data  Collection,Appointment; Requested  for:01May2017;  Benign prostatic hyperplasia with urinary obstruction, Dysuria, Health Maintenance, Prostate cancer  2. Follow-up Month x 6 Office  Follow-up  Status: Hold For - Appointment,Date of Service   Requested for: [id number] Health Maintenance  3. UA With REFLEX; [Do Not Release]; Status:Complete;   [id number] 10:35AM Malignant neoplasm of overlapping sites of bladder  4. Cysto; Status:Hold For - Appointment,Date of Service; Requested for:01May2017;  5. URINE CYTOLOGY; Status:Hold For - Specimen/Data Collection,Appointment;  Requested for:01May2017;   Discussion/Summary Bladder cancer-urine cytology sent. Cystoscopy was benign today apart from very mild erythema at dome.     Prostate cancer-check PSA in 6 months before f/u.     Signatures Electronically signed by : [name], M.D.; Jun 09 2015 11:03AM EST  Add; Cytology suspicious for cancer. Plan cysto, bbx, RGP's, renal wash

## 2015-07-08 ENCOUNTER — Ambulatory Visit (HOSPITAL_BASED_OUTPATIENT_CLINIC_OR_DEPARTMENT_OTHER): Payer: PPO | Admitting: Anesthesiology

## 2015-07-08 ENCOUNTER — Encounter (HOSPITAL_BASED_OUTPATIENT_CLINIC_OR_DEPARTMENT_OTHER)
Admission: RE | Disposition: A | Discharge status: Home / Self Care | Payer: Self-pay | Source: Ambulatory Visit | Attending: Urology

## 2015-07-08 ENCOUNTER — Encounter (HOSPITAL_BASED_OUTPATIENT_CLINIC_OR_DEPARTMENT_OTHER): Payer: Self-pay | Admitting: *Deleted

## 2015-07-08 ENCOUNTER — Ambulatory Visit (HOSPITAL_BASED_OUTPATIENT_CLINIC_OR_DEPARTMENT_OTHER)
Admission: RE | Admit: 2015-07-08 | Discharge: 2015-07-08 | Disposition: A | Discharge status: Home / Self Care | Payer: PPO | Source: Ambulatory Visit | Attending: Urology | Admitting: Urology

## 2015-07-08 DIAGNOSIS — I10 Essential (primary) hypertension: Secondary | ICD-10-CM | POA: Insufficient documentation

## 2015-07-08 DIAGNOSIS — Z79899 Other long term (current) drug therapy: Secondary | ICD-10-CM | POA: Insufficient documentation

## 2015-07-08 DIAGNOSIS — E785 Hyperlipidemia, unspecified: Secondary | ICD-10-CM | POA: Insufficient documentation

## 2015-07-08 DIAGNOSIS — I480 Paroxysmal atrial fibrillation: Secondary | ICD-10-CM | POA: Diagnosis not present

## 2015-07-08 DIAGNOSIS — K219 Gastro-esophageal reflux disease without esophagitis: Secondary | ICD-10-CM | POA: Insufficient documentation

## 2015-07-08 DIAGNOSIS — Z888 Allergy status to other drugs, medicaments and biological substances status: Secondary | ICD-10-CM | POA: Insufficient documentation

## 2015-07-08 DIAGNOSIS — Z7902 Long term (current) use of antithrombotics/antiplatelets: Secondary | ICD-10-CM | POA: Insufficient documentation

## 2015-07-08 DIAGNOSIS — Z885 Allergy status to narcotic agent status: Secondary | ICD-10-CM | POA: Insufficient documentation

## 2015-07-08 DIAGNOSIS — D494 Neoplasm of unspecified behavior of bladder: Secondary | ICD-10-CM | POA: Diagnosis not present

## 2015-07-08 DIAGNOSIS — M1990 Unspecified osteoarthritis, unspecified site: Secondary | ICD-10-CM | POA: Insufficient documentation

## 2015-07-08 DIAGNOSIS — Z87442 Personal history of urinary calculi: Secondary | ICD-10-CM | POA: Insufficient documentation

## 2015-07-08 DIAGNOSIS — M109 Gout, unspecified: Secondary | ICD-10-CM | POA: Insufficient documentation

## 2015-07-08 DIAGNOSIS — C679 Malignant neoplasm of bladder, unspecified: Secondary | ICD-10-CM | POA: Diagnosis not present

## 2015-07-08 DIAGNOSIS — N3289 Other specified disorders of bladder: Secondary | ICD-10-CM | POA: Insufficient documentation

## 2015-07-08 DIAGNOSIS — Z91030 Bee allergy status: Secondary | ICD-10-CM | POA: Insufficient documentation

## 2015-07-08 DIAGNOSIS — G4733 Obstructive sleep apnea (adult) (pediatric): Secondary | ICD-10-CM | POA: Insufficient documentation

## 2015-07-08 DIAGNOSIS — N3020 Other chronic cystitis without hematuria: Secondary | ICD-10-CM | POA: Diagnosis not present

## 2015-07-08 DIAGNOSIS — I252 Old myocardial infarction: Secondary | ICD-10-CM | POA: Insufficient documentation

## 2015-07-08 HISTORY — PX: CYSTOSCOPY WITH BIOPSY: SHX5122

## 2015-07-08 HISTORY — DX: Obstructive sleep apnea (adult) (pediatric): G47.33

## 2015-07-08 HISTORY — DX: Dependence on other enabling machines and devices: Z99.89

## 2015-07-08 HISTORY — PX: CYSTOSCOPY W/ RETROGRADES: SHX1426

## 2015-07-08 HISTORY — DX: Old myocardial infarction: I25.2

## 2015-07-08 HISTORY — DX: Gout, unspecified: M10.9

## 2015-07-08 SURGERY — CYSTOSCOPY, WITH BIOPSY
Anesthesia: General

## 2015-07-08 MED ORDER — STERILE WATER FOR IRRIGATION IR SOLN
Status: DC | PRN
  Administered 2015-07-08: 3000 mL

## 2015-07-08 MED ORDER — DEXAMETHASONE SODIUM PHOSPHATE 4 MG/ML IJ SOLN
INTRAMUSCULAR | Status: DC | PRN
  Administered 2015-07-08: 10 mg via INTRAVENOUS

## 2015-07-08 MED ORDER — LACTATED RINGERS IV SOLN
INTRAVENOUS | Status: DC
  Administered 2015-07-08: 10:00:00 via INTRAVENOUS
  Filled 2015-07-08: qty 1000

## 2015-07-08 MED ORDER — MIDAZOLAM HCL 2 MG/2ML IJ SOLN
INTRAMUSCULAR | Status: AC
  Filled 2015-07-08: qty 2

## 2015-07-08 MED ORDER — PROPOFOL 10 MG/ML IV BOLUS
INTRAVENOUS | Status: AC
Start: 2015-07-08 — End: 2015-07-08
  Filled 2015-07-08: qty 40

## 2015-07-08 MED ORDER — PROPOFOL 10 MG/ML IV BOLUS
INTRAVENOUS | Status: DC | PRN
  Administered 2015-07-08: 200 mg via INTRAVENOUS

## 2015-07-08 MED ORDER — ONDANSETRON HCL 4 MG/2ML IJ SOLN
INTRAMUSCULAR | Status: DC | PRN
Start: 2015-07-08 — End: 2015-07-08
  Administered 2015-07-08: 4 mg via INTRAVENOUS

## 2015-07-08 MED ORDER — CEFAZOLIN SODIUM-DEXTROSE 2-4 GM/100ML-% IV SOLN
INTRAVENOUS | Status: AC
  Filled 2015-07-08: qty 100

## 2015-07-08 MED ORDER — MIDAZOLAM HCL 5 MG/5ML IJ SOLN
INTRAMUSCULAR | Status: DC | PRN
  Administered 2015-07-08: 2 mg via INTRAVENOUS

## 2015-07-08 MED ORDER — LIDOCAINE HCL (CARDIAC) 20 MG/ML IV SOLN
INTRAVENOUS | Status: AC
  Filled 2015-07-08: qty 5

## 2015-07-08 MED ORDER — FENTANYL CITRATE (PF) 100 MCG/2ML IJ SOLN
INTRAMUSCULAR | Status: AC
  Filled 2015-07-08: qty 4

## 2015-07-08 MED ORDER — CEFAZOLIN SODIUM-DEXTROSE 2-4 GM/100ML-% IV SOLN
2.0000 g | INTRAVENOUS | Status: AC
  Administered 2015-07-08: 2 g via INTRAVENOUS
  Filled 2015-07-08: qty 100

## 2015-07-08 MED ORDER — LIDOCAINE 2% (20 MG/ML) 5 ML SYRINGE
INTRAMUSCULAR | Status: DC | PRN
  Administered 2015-07-08: 60 mg via INTRAVENOUS

## 2015-07-08 MED ORDER — ONDANSETRON HCL 4 MG/2ML IJ SOLN
INTRAMUSCULAR | Status: AC
  Filled 2015-07-08: qty 2

## 2015-07-08 MED ORDER — DEXAMETHASONE SODIUM PHOSPHATE 10 MG/ML IJ SOLN
INTRAMUSCULAR | Status: AC
  Filled 2015-07-08: qty 1

## 2015-07-08 MED ORDER — CEFAZOLIN SODIUM 1-5 GM-% IV SOLN
1.0000 g | INTRAVENOUS | Status: DC
  Filled 2015-07-08: qty 50

## 2015-07-08 MED ORDER — FENTANYL CITRATE (PF) 100 MCG/2ML IJ SOLN
INTRAMUSCULAR | Status: DC | PRN
  Administered 2015-07-08: 50 ug via INTRAVENOUS

## 2015-07-08 SURGICAL SUPPLY — 43 items
ADAPTER CATH URET PLST 4-6FR (CATHETERS) IMPLANT
ADPR CATH URET STRL DISP 4-6FR (CATHETERS)
BAG DRAIN URO-CYSTO SKYTR STRL (DRAIN) ×3 IMPLANT
BAG DRN UROCATH (DRAIN) ×1
BASKET LASER NITINOL 1.9FR (BASKET) IMPLANT
BASKET STNLS GEMINI 4WIRE 3FR (BASKET) IMPLANT
BASKET ZERO TIP NITINOL 2.4FR (BASKET) IMPLANT
BSKT STON RTRVL 120 1.9FR (BASKET)
BSKT STON RTRVL GEM 120X11 3FR (BASKET)
BSKT STON RTRVL ZERO TP 2.4FR (BASKET)
CANISTER SUCT LVC 12 LTR MEDI- (MISCELLANEOUS) IMPLANT
CATH INTERMIT  6FR 70CM (CATHETERS) IMPLANT
CATH URET 5FR 28IN CONE TIP (BALLOONS)
CATH URET 5FR 28IN OPEN ENDED (CATHETERS) IMPLANT
CATH URET 5FR 70CM CONE TIP (BALLOONS) IMPLANT
CATH URET DUAL LUMEN 6-10FR 50 (CATHETERS) IMPLANT
CLOTH BEACON ORANGE TIMEOUT ST (SAFETY) ×3 IMPLANT
ELECT REM PT RETURN 9FT ADLT (ELECTROSURGICAL) ×3
ELECTRODE REM PT RTRN 9FT ADLT (ELECTROSURGICAL) ×1 IMPLANT
FIBER LASER FLEXIVA 365 (UROLOGICAL SUPPLIES) IMPLANT
FIBER LASER TRAC TIP (UROLOGICAL SUPPLIES) IMPLANT
GLOVE BIO SURGEON STRL SZ7.5 (GLOVE) ×3 IMPLANT
GLOVE BIOGEL PI IND STRL 7.5 (GLOVE) ×1 IMPLANT
GLOVE BIOGEL PI INDICATOR 7.5 (GLOVE) ×2
GLOVE INDICATOR 7.5 STRL GRN (GLOVE) ×3 IMPLANT
GOWN STRL REUS W/ TWL XL LVL3 (GOWN DISPOSABLE) ×1 IMPLANT
GOWN STRL REUS W/TWL XL LVL3 (GOWN DISPOSABLE) ×3
GUIDEWIRE 0.038 PTFE COATED (WIRE) IMPLANT
GUIDEWIRE ANG ZIPWIRE 038X150 (WIRE) IMPLANT
GUIDEWIRE STR DUAL SENSOR (WIRE) IMPLANT
KIT BALLIN UROMAX 15FX10 (LABEL) IMPLANT
KIT BALLN UROMAX 15FX4 (MISCELLANEOUS) IMPLANT
KIT BALLN UROMAX 26 75X4 (MISCELLANEOUS)
KIT ROOM TURNOVER WOR (KITS) ×3 IMPLANT
MANIFOLD NEPTUNE II (INSTRUMENTS) IMPLANT
NEEDLE HYPO 22GX1.5 SAFETY (NEEDLE) IMPLANT
NS IRRIG 500ML POUR BTL (IV SOLUTION) IMPLANT
PACK CYSTO (CUSTOM PROCEDURE TRAY) ×3 IMPLANT
SET HIGH PRES BAL DIL (LABEL)
SYRINGE IRR TOOMEY STRL 70CC (SYRINGE) IMPLANT
TUBE CONNECTING 12'X1/4 (SUCTIONS)
TUBE CONNECTING 12X1/4 (SUCTIONS) IMPLANT
WATER STERILE IRR 3000ML UROMA (IV SOLUTION) ×3 IMPLANT

## 2015-07-08 NOTE — Anesthesia Procedure Notes (Signed)
Procedure Name: LMA Insertion Date/Time: 07/08/2015 12:08 PM Performed by: Wanita Chamberlain Pre-anesthesia Checklist: Patient identified, Timeout performed, Emergency Drugs available, Suction available and Patient being monitored Patient Re-evaluated:Patient Re-evaluated prior to inductionOxygen Delivery Method: Circle system utilized Preoxygenation: Pre-oxygenation with 100% oxygen Intubation Type: IV induction Ventilation: Mask ventilation without difficulty LMA: LMA inserted LMA Size: 5.0 Number of attempts: 1 Airway Equipment and Method: Bite block Placement Confirmation: breath sounds checked- equal and bilateral and positive ETCO2 Tube secured with: Tape Dental Injury: Teeth and Oropharynx as per pre-operative assessment

## 2015-07-08 NOTE — Interval H&P Note (Signed)
History and Physical Interval Note:  07/08/2015 11:36 AM  Charles Wright  has presented today for surgery, with the diagnosis of Bladder Cancer  The various methods of treatment have been discussed with the patient and family. After consideration of risks, benefits and other options for treatment, the patient has consented to  Procedure(s): CYSTOSCOPY WITH BLADDER BIOPSY, FULGURATION, RETROGRADE PYLOGRAM AND RENAL WASHING (N/A) CYSTOSCOPY WITH RETROGRADE PYELOGRAM (N/A) as a surgical intervention .  The patient's history has been reviewed, patient examined, no change in status, stable for surgery. He's had no dysuria or fever. He gets occasional left flank pain and told me to "check" the left kidney. I have reviewed the patient's chart and labs.  Questions were answered to the patient's satisfaction.  He elects to proceed.    Terren Haberle

## 2015-07-08 NOTE — Op Note (Signed)
Preoperative diagnosis: Bladder erythema Postoperative diagnosis: Bladder erythema  Procedure: Cystoscopy, bilateral retrograde pyelogram, bladder biopsy with fulguration  Surgeon: Junious Silk  Anesthesia: Gen.  Findings: On exam under anesthesia the foreskin and penis were without mass or lesion. The testicles were descended bilaterally and palpably normal. On digital rectal exam the prostate was smooth without hard area or nodule. All landmarks were preserved.  On cystoscopy the urethra and the prostatic urethra appeared normal. The trigone and ureteral orifices were in their normal orthotopic position. There was clear efflux from both ureteral orifices. The bladder mucosa was normal apart from some erythematous mucosa at the right posterior and the left posterior going up toward the dome. These areas were biopsied. The remainder of the mucosa was unremarkable. There were no stones or foreign bodies in the bladder.  Left retrograde pyelogram-outlined a narrowing in the distal ureter consistent with the known stricture, but I thought less dilation of the mid and proximal ureter than the past. The renal pelvis and collecting system appeared normal. There was brisk washout.  Right retrograde pyelogram-this outlined a single ureter single collecting system unit without filling defect, stricture or dilation. There was brisk washout.  Description of procedure: After consent was obtained patient brought to the operating room. After adequate anesthesia his placed in lithotomy position and prepped and draped in the usual sterile fashion. A timeout was performed to confirm the patient and procedure. An exam under anesthesia was performed. The cystoscope was passed per urethra and the bladder inspected with the 30 and 70 lens. The bladder was irrigated several times. Left retrograde was obtained I cannulating the left ureteral orifice with an open-ended catheter, 6 [place], and injecting contrast. Similarly  the right side was cannulated and contrast injected retrograde. The retrogrades were pristine.  The posterior bladder was then biopsied on the right posterior and the left posterior. These areas were fulgurated with excellent hemostasis. Also fulgurated some of the redness around the biopsy sites in the event this contained CIS. The bladder was drained and again hemostasis was excellent. The scope was removed and the patient was awakened and taken to recovery room in stable condition. I had considered washing the upper tracts but considering the cytology was only suspicious and there was some red areas in the posterior bladder I wanted to start in these locations. I did not want to risk seeding the upper tract with any cancer cells from the bladder. The retrograde roots were completely normal. The left side even looked and drain better than prior in my estimation.  Blood loss: 3 mL  Drains: None  Complications: None  Specimens to pathology: #1 right posterior bladder biopsy #2 left posterior bladder biopsy-

## 2015-07-08 NOTE — Discharge Instructions (Signed)
Cystoscopy, Care After Refer to this sheet in the next few weeks. These instructions provide you with information on caring for yourself after your procedure. Your caregiver may also give you more specific instructions. Your treatment has been planned according to current medical practices, but problems sometimes occur. Call your caregiver if you have any problems or questions after your procedure. HOME CARE INSTRUCTIONS  Things you can do to ease any discomfort after your procedure include:  Drinking enough water and fluids to keep your urine clear or pale yellow.  Taking a warm bath to relieve any burning feelings. SEEK IMMEDIATE MEDICAL CARE IF:   You have an increase in blood in your urine.  You notice blood clots in your urine.  You have difficulty passing urine.  You have the chills.  You have abdominal pain.  You have a fever or persistent symptoms for more than 2-3 days.  You have a fever and your symptoms suddenly get worse. MAKE SURE YOU:   Understand these instructions.  Will watch your condition.  Will get help right away if you are not doing well or get worse.   This information is not intended to replace advice given to you by your health care provider. Make sure you discuss any questions you have with your health care provider.   Document Released: 08/14/2004 Document Revised: 02/15/2014 Document Reviewed: 07/19/2011 Elsevier Interactive Patient Education 2016 [place] [facility] Care Instructions  Activity: Get plenty of rest for the remainder of the day. A responsible adult should stay with you for 24 hours following the procedure.  For the next 24 hours, DO NOT: -Drive a car -Paediatric nurse -Drink alcoholic beverages -Take any medication unless instructed by your physician -Make any legal decisions or sign important papers.  Meals: Start with liquid foods such as gelatin or soup. Progress to regular foods as tolerated. Avoid  greasy, spicy, heavy foods. If nausea and/or vomiting occur, drink only clear liquids until the nausea and/or vomiting subsides. Call your physician if vomiting continues.  Special Instructions/Symptoms: Your throat may feel dry or sore from the anesthesia or the breathing tube placed in your throat during surgery. If this causes discomfort, gargle with warm salt water. The discomfort should disappear within 24 hours.

## 2015-07-08 NOTE — Transfer of Care (Signed)
Immediate Anesthesia Transfer of Care Note  Patient: Charles Wright  Procedure(s) Performed: Procedure(s): CYSTOSCOPY WITH BLADDER BIOPSY, FULGURATION, RETROGRADE PYLOGRAM AND RENAL WASHING (N/A) CYSTOSCOPY WITH RETROGRADE PYELOGRAM (N/A)  Patient Location: PACU  Anesthesia Type:General  Level of Consciousness: awake, alert , oriented and patient cooperative  Airway & Oxygen Therapy: Patient Spontanous Breathing and Patient connected to nasal cannula oxygen  Post-op Assessment: Report given to RN and Post -op Vital signs reviewed and stable  Post vital signs: Reviewed and stable  Last Vitals:  Filed Vitals:   07/08/15 0916  BP: 123/72  Pulse: 55  Temp: 36.4 C  Resp: 16    Last Pain: There were no vitals filed for this visit.    Patients Stated Pain Goal: 5 ([id number])  Complications: No apparent anesthesia complications

## 2015-07-08 NOTE — Anesthesia Preprocedure Evaluation (Addendum)
Anesthesia Evaluation  Patient identified by MRN, date of birth, ID band Patient awake    Reviewed: Allergy & Precautions, H&P , Patient's Chart, lab work & pertinent test results, reviewed documented beta blocker date and time   Airway Mallampati: II  TM Distance: >3 FB Neck ROM: full    Dental no notable dental hx. (+) Teeth Intact, Dental Advisory Given,    Pulmonary sleep apnea ,    Pulmonary exam normal breath sounds clear to auscultation       Cardiovascular Exercise Tolerance: Good hypertension, Pt. on medications and Pt. on home beta blockers  Rhythm:regular Rate:Normal     Neuro/Psych    GI/Hepatic Neg liver ROS, GERD  Medicated and Controlled,  Endo/Other  negative endocrine ROS  Renal/GU      Musculoskeletal  (+) Arthritis ,   Abdominal   Peds  Hematology   Anesthesia Other Findings Hypertension    Hyperlipidemia      Gilbert's syndrome       GERD   Paroxysmal a-fib   CARDIOLOGIST- DR Wynonia Lawman...... NSTEMI 12/2014 - 99% dLAD-2 (not amenable to PCI), 50% dLAD-1, 60% mLAD. Medical therapy was recommended. Acute gout of left ankle   OSA on CPAP  Bilateral hearing aids in place         Reproductive/Obstetrics                           Anesthesia Physical Anesthesia Plan  ASA: II  Anesthesia Plan:    Post-op Pain Management:    Induction: Intravenous  Airway Management Planned: LMA  Additional Equipment:   Intra-op Plan:   Post-operative Plan:   Informed Consent: I have reviewed the patients History and Physical, chart, labs and discussed the procedure including the risks, benefits and alternatives for the proposed anesthesia with the patient or authorized representative who has indicated his/her understanding and acceptance.   Dental Advisory Given and Dental advisory given  Plan Discussed with: CRNA and Surgeon  Anesthesia Plan Comments: (Discussed GA  with LMA, possible sore throat, potential need to switch to ETT, N/V, pulmonary aspiration. Questions answered. )        Anesthesia Quick Evaluation

## 2015-07-09 DIAGNOSIS — T63451D Toxic effect of venom of hornets, accidental (unintentional), subsequent encounter: Secondary | ICD-10-CM | POA: Diagnosis not present

## 2015-07-09 DIAGNOSIS — T63461D Toxic effect of venom of wasps, accidental (unintentional), subsequent encounter: Secondary | ICD-10-CM | POA: Diagnosis not present

## 2015-07-10 ENCOUNTER — Encounter (HOSPITAL_BASED_OUTPATIENT_CLINIC_OR_DEPARTMENT_OTHER): Payer: Self-pay | Admitting: Urology

## 2015-07-11 NOTE — Anesthesia Postprocedure Evaluation (Signed)
Anesthesia Post Note  Patient: Charles Wright  Procedure(s) Performed: Procedure(s) (LRB): CYSTOSCOPY WITH BLADDER BIOPSY, FULGURATION, RETROGRADE PYLOGRAM AND RENAL WASHING (N/A) CYSTOSCOPY WITH RETROGRADE PYELOGRAM (N/A)  Patient location during evaluation: PACU Anesthesia Type: General Level of consciousness: sedated Pain management: satisfactory to patient Vital Signs Assessment: post-procedure vital signs reviewed and stable Respiratory status: spontaneous breathing Cardiovascular status: stable Anesthetic complications: no    Last Vitals:  Filed Vitals:   07/08/15 1300 07/08/15 1315  BP: 112/73 115/75  Pulse: 47 51  Temp:    Resp: 15 19    Last Pain:  Filed Vitals:   07/10/15 1442  PainSc: 0-No pain                 Rees Santistevan EDWARD

## 2015-07-14 DIAGNOSIS — T63451D Toxic effect of venom of hornets, accidental (unintentional), subsequent encounter: Secondary | ICD-10-CM | POA: Diagnosis not present

## 2015-07-14 DIAGNOSIS — T63461D Toxic effect of venom of wasps, accidental (unintentional), subsequent encounter: Secondary | ICD-10-CM | POA: Diagnosis not present

## 2015-07-14 DIAGNOSIS — G4733 Obstructive sleep apnea (adult) (pediatric): Secondary | ICD-10-CM | POA: Diagnosis not present

## 2015-07-18 DIAGNOSIS — F4323 Adjustment disorder with mixed anxiety and depressed mood: Secondary | ICD-10-CM | POA: Diagnosis not present

## 2015-07-18 DIAGNOSIS — Z630 Problems in relationship with spouse or partner: Secondary | ICD-10-CM | POA: Diagnosis not present

## 2015-07-21 DIAGNOSIS — T63451D Toxic effect of venom of hornets, accidental (unintentional), subsequent encounter: Secondary | ICD-10-CM | POA: Diagnosis not present

## 2015-07-21 DIAGNOSIS — T63461D Toxic effect of venom of wasps, accidental (unintentional), subsequent encounter: Secondary | ICD-10-CM | POA: Diagnosis not present

## 2015-07-23 DIAGNOSIS — G4733 Obstructive sleep apnea (adult) (pediatric): Secondary | ICD-10-CM | POA: Diagnosis not present

## 2015-07-28 DIAGNOSIS — T63451D Toxic effect of venom of hornets, accidental (unintentional), subsequent encounter: Secondary | ICD-10-CM | POA: Diagnosis not present

## 2015-07-28 DIAGNOSIS — T63461D Toxic effect of venom of wasps, accidental (unintentional), subsequent encounter: Secondary | ICD-10-CM | POA: Diagnosis not present

## 2015-08-01 ENCOUNTER — Encounter (HOSPITAL_COMMUNITY): Payer: Self-pay | Admitting: Internal Medicine

## 2015-08-01 DIAGNOSIS — F4323 Adjustment disorder with mixed anxiety and depressed mood: Secondary | ICD-10-CM | POA: Diagnosis not present

## 2015-08-01 DIAGNOSIS — Z630 Problems in relationship with spouse or partner: Secondary | ICD-10-CM | POA: Diagnosis not present

## 2015-08-13 DIAGNOSIS — T63461D Toxic effect of venom of wasps, accidental (unintentional), subsequent encounter: Secondary | ICD-10-CM | POA: Diagnosis not present

## 2015-08-13 DIAGNOSIS — T63451D Toxic effect of venom of hornets, accidental (unintentional), subsequent encounter: Secondary | ICD-10-CM | POA: Diagnosis not present

## 2015-08-18 DIAGNOSIS — G4733 Obstructive sleep apnea (adult) (pediatric): Secondary | ICD-10-CM | POA: Diagnosis not present

## 2015-08-18 DIAGNOSIS — T63461D Toxic effect of venom of wasps, accidental (unintentional), subsequent encounter: Secondary | ICD-10-CM | POA: Diagnosis not present

## 2015-08-18 DIAGNOSIS — T63451D Toxic effect of venom of hornets, accidental (unintentional), subsequent encounter: Secondary | ICD-10-CM | POA: Diagnosis not present

## 2015-08-22 DIAGNOSIS — G4733 Obstructive sleep apnea (adult) (pediatric): Secondary | ICD-10-CM | POA: Diagnosis not present

## 2015-08-26 DIAGNOSIS — T63461D Toxic effect of venom of wasps, accidental (unintentional), subsequent encounter: Secondary | ICD-10-CM | POA: Diagnosis not present

## 2015-08-26 DIAGNOSIS — T63451D Toxic effect of venom of hornets, accidental (unintentional), subsequent encounter: Secondary | ICD-10-CM | POA: Diagnosis not present

## 2015-08-27 DIAGNOSIS — G4733 Obstructive sleep apnea (adult) (pediatric): Secondary | ICD-10-CM | POA: Diagnosis not present

## 2015-09-01 DIAGNOSIS — T63451D Toxic effect of venom of hornets, accidental (unintentional), subsequent encounter: Secondary | ICD-10-CM | POA: Diagnosis not present

## 2015-09-01 DIAGNOSIS — T63461D Toxic effect of venom of wasps, accidental (unintentional), subsequent encounter: Secondary | ICD-10-CM | POA: Diagnosis not present

## 2015-09-08 DIAGNOSIS — T63451D Toxic effect of venom of hornets, accidental (unintentional), subsequent encounter: Secondary | ICD-10-CM | POA: Diagnosis not present

## 2015-09-08 DIAGNOSIS — T63461D Toxic effect of venom of wasps, accidental (unintentional), subsequent encounter: Secondary | ICD-10-CM | POA: Diagnosis not present

## 2015-09-15 DIAGNOSIS — T63461D Toxic effect of venom of wasps, accidental (unintentional), subsequent encounter: Secondary | ICD-10-CM | POA: Diagnosis not present

## 2015-09-15 DIAGNOSIS — T63451D Toxic effect of venom of hornets, accidental (unintentional), subsequent encounter: Secondary | ICD-10-CM | POA: Diagnosis not present

## 2015-09-22 DIAGNOSIS — G4733 Obstructive sleep apnea (adult) (pediatric): Secondary | ICD-10-CM | POA: Diagnosis not present

## 2015-09-22 DIAGNOSIS — T63461D Toxic effect of venom of wasps, accidental (unintentional), subsequent encounter: Secondary | ICD-10-CM | POA: Diagnosis not present

## 2015-09-22 DIAGNOSIS — T63451D Toxic effect of venom of hornets, accidental (unintentional), subsequent encounter: Secondary | ICD-10-CM | POA: Diagnosis not present

## 2015-09-25 DIAGNOSIS — E559 Vitamin D deficiency, unspecified: Secondary | ICD-10-CM | POA: Diagnosis not present

## 2015-09-25 DIAGNOSIS — M109 Gout, unspecified: Secondary | ICD-10-CM | POA: Diagnosis not present

## 2015-09-25 DIAGNOSIS — I2510 Atherosclerotic heart disease of native coronary artery without angina pectoris: Secondary | ICD-10-CM | POA: Diagnosis not present

## 2015-09-25 DIAGNOSIS — C679 Malignant neoplasm of bladder, unspecified: Secondary | ICD-10-CM | POA: Diagnosis not present

## 2015-09-25 DIAGNOSIS — E039 Hypothyroidism, unspecified: Secondary | ICD-10-CM | POA: Diagnosis not present

## 2015-09-25 DIAGNOSIS — I10 Essential (primary) hypertension: Secondary | ICD-10-CM | POA: Diagnosis not present

## 2015-09-25 DIAGNOSIS — I480 Paroxysmal atrial fibrillation: Secondary | ICD-10-CM | POA: Diagnosis not present

## 2015-09-25 DIAGNOSIS — Z91038 Other insect allergy status: Secondary | ICD-10-CM | POA: Diagnosis not present

## 2015-09-25 DIAGNOSIS — E7800 Pure hypercholesterolemia, unspecified: Secondary | ICD-10-CM | POA: Diagnosis not present

## 2015-09-25 DIAGNOSIS — C61 Malignant neoplasm of prostate: Secondary | ICD-10-CM | POA: Diagnosis not present

## 2015-09-29 DIAGNOSIS — T63461D Toxic effect of venom of wasps, accidental (unintentional), subsequent encounter: Secondary | ICD-10-CM | POA: Diagnosis not present

## 2015-09-29 DIAGNOSIS — G4733 Obstructive sleep apnea (adult) (pediatric): Secondary | ICD-10-CM | POA: Diagnosis not present

## 2015-09-29 DIAGNOSIS — T63451D Toxic effect of venom of hornets, accidental (unintentional), subsequent encounter: Secondary | ICD-10-CM | POA: Diagnosis not present

## 2015-10-02 DIAGNOSIS — I480 Paroxysmal atrial fibrillation: Secondary | ICD-10-CM | POA: Diagnosis not present

## 2015-10-02 DIAGNOSIS — E785 Hyperlipidemia, unspecified: Secondary | ICD-10-CM | POA: Diagnosis not present

## 2015-10-02 DIAGNOSIS — I2510 Atherosclerotic heart disease of native coronary artery without angina pectoris: Secondary | ICD-10-CM | POA: Diagnosis not present

## 2015-10-02 DIAGNOSIS — G4733 Obstructive sleep apnea (adult) (pediatric): Secondary | ICD-10-CM | POA: Diagnosis not present

## 2015-10-02 DIAGNOSIS — I10 Essential (primary) hypertension: Secondary | ICD-10-CM | POA: Diagnosis not present

## 2015-10-02 DIAGNOSIS — E668 Other obesity: Secondary | ICD-10-CM | POA: Diagnosis not present

## 2015-10-21 DIAGNOSIS — D225 Melanocytic nevi of trunk: Secondary | ICD-10-CM | POA: Diagnosis not present

## 2015-10-21 DIAGNOSIS — Z85828 Personal history of other malignant neoplasm of skin: Secondary | ICD-10-CM | POA: Diagnosis not present

## 2015-10-21 DIAGNOSIS — L821 Other seborrheic keratosis: Secondary | ICD-10-CM | POA: Diagnosis not present

## 2015-10-21 DIAGNOSIS — L814 Other melanin hyperpigmentation: Secondary | ICD-10-CM | POA: Diagnosis not present

## 2015-10-21 DIAGNOSIS — D1801 Hemangioma of skin and subcutaneous tissue: Secondary | ICD-10-CM | POA: Diagnosis not present

## 2015-10-23 DIAGNOSIS — G4733 Obstructive sleep apnea (adult) (pediatric): Secondary | ICD-10-CM | POA: Diagnosis not present

## 2015-10-27 DIAGNOSIS — T63451D Toxic effect of venom of hornets, accidental (unintentional), subsequent encounter: Secondary | ICD-10-CM | POA: Diagnosis not present

## 2015-10-27 DIAGNOSIS — T63461D Toxic effect of venom of wasps, accidental (unintentional), subsequent encounter: Secondary | ICD-10-CM | POA: Diagnosis not present

## 2015-11-10 DIAGNOSIS — Z23 Encounter for immunization: Secondary | ICD-10-CM | POA: Diagnosis not present

## 2015-11-10 DIAGNOSIS — L03119 Cellulitis of unspecified part of limb: Secondary | ICD-10-CM | POA: Diagnosis not present

## 2015-11-10 DIAGNOSIS — L551 Sunburn of second degree: Secondary | ICD-10-CM | POA: Diagnosis not present

## 2015-11-12 DIAGNOSIS — N401 Enlarged prostate with lower urinary tract symptoms: Secondary | ICD-10-CM | POA: Diagnosis not present

## 2015-11-22 DIAGNOSIS — G4733 Obstructive sleep apnea (adult) (pediatric): Secondary | ICD-10-CM | POA: Diagnosis not present

## 2015-11-25 DIAGNOSIS — T63461D Toxic effect of venom of wasps, accidental (unintentional), subsequent encounter: Secondary | ICD-10-CM | POA: Diagnosis not present

## 2015-11-25 DIAGNOSIS — T63451D Toxic effect of venom of hornets, accidental (unintentional), subsequent encounter: Secondary | ICD-10-CM | POA: Diagnosis not present

## 2015-12-02 DIAGNOSIS — T63461D Toxic effect of venom of wasps, accidental (unintentional), subsequent encounter: Secondary | ICD-10-CM | POA: Diagnosis not present

## 2015-12-02 DIAGNOSIS — T63451D Toxic effect of venom of hornets, accidental (unintentional), subsequent encounter: Secondary | ICD-10-CM | POA: Diagnosis not present

## 2015-12-04 DIAGNOSIS — G4733 Obstructive sleep apnea (adult) (pediatric): Secondary | ICD-10-CM | POA: Diagnosis not present

## 2015-12-09 DIAGNOSIS — T63461D Toxic effect of venom of wasps, accidental (unintentional), subsequent encounter: Secondary | ICD-10-CM | POA: Diagnosis not present

## 2015-12-09 DIAGNOSIS — T63451D Toxic effect of venom of hornets, accidental (unintentional), subsequent encounter: Secondary | ICD-10-CM | POA: Diagnosis not present

## 2015-12-10 DIAGNOSIS — C61 Malignant neoplasm of prostate: Secondary | ICD-10-CM | POA: Diagnosis not present

## 2015-12-10 DIAGNOSIS — C672 Malignant neoplasm of lateral wall of bladder: Secondary | ICD-10-CM | POA: Diagnosis not present

## 2015-12-15 DIAGNOSIS — H903 Sensorineural hearing loss, bilateral: Secondary | ICD-10-CM | POA: Diagnosis not present

## 2015-12-16 DIAGNOSIS — T63461D Toxic effect of venom of wasps, accidental (unintentional), subsequent encounter: Secondary | ICD-10-CM | POA: Diagnosis not present

## 2015-12-16 DIAGNOSIS — T63451D Toxic effect of venom of hornets, accidental (unintentional), subsequent encounter: Secondary | ICD-10-CM | POA: Diagnosis not present

## 2015-12-23 DIAGNOSIS — G4733 Obstructive sleep apnea (adult) (pediatric): Secondary | ICD-10-CM | POA: Diagnosis not present

## 2015-12-30 ENCOUNTER — Other Ambulatory Visit: Payer: Self-pay | Admitting: Dermatology

## 2015-12-30 DIAGNOSIS — C44629 Squamous cell carcinoma of skin of left upper limb, including shoulder: Secondary | ICD-10-CM | POA: Diagnosis not present

## 2016-01-12 DIAGNOSIS — T63461D Toxic effect of venom of wasps, accidental (unintentional), subsequent encounter: Secondary | ICD-10-CM | POA: Diagnosis not present

## 2016-01-12 DIAGNOSIS — T63451D Toxic effect of venom of hornets, accidental (unintentional), subsequent encounter: Secondary | ICD-10-CM | POA: Diagnosis not present

## 2016-01-22 DIAGNOSIS — G4733 Obstructive sleep apnea (adult) (pediatric): Secondary | ICD-10-CM | POA: Diagnosis not present

## 2016-01-28 DIAGNOSIS — M25561 Pain in right knee: Secondary | ICD-10-CM | POA: Diagnosis not present

## 2016-01-28 DIAGNOSIS — M109 Gout, unspecified: Secondary | ICD-10-CM | POA: Diagnosis not present

## 2016-02-10 DIAGNOSIS — J069 Acute upper respiratory infection, unspecified: Secondary | ICD-10-CM | POA: Diagnosis not present

## 2016-02-13 DIAGNOSIS — T63461D Toxic effect of venom of wasps, accidental (unintentional), subsequent encounter: Secondary | ICD-10-CM | POA: Diagnosis not present

## 2016-02-13 DIAGNOSIS — T63451D Toxic effect of venom of hornets, accidental (unintentional), subsequent encounter: Secondary | ICD-10-CM | POA: Diagnosis not present

## 2016-02-20 DIAGNOSIS — R109 Unspecified abdominal pain: Secondary | ICD-10-CM | POA: Diagnosis not present

## 2016-02-20 DIAGNOSIS — R1032 Left lower quadrant pain: Secondary | ICD-10-CM | POA: Diagnosis not present

## 2016-02-22 DIAGNOSIS — G4733 Obstructive sleep apnea (adult) (pediatric): Secondary | ICD-10-CM | POA: Diagnosis not present

## 2016-03-15 DIAGNOSIS — T63451D Toxic effect of venom of hornets, accidental (unintentional), subsequent encounter: Secondary | ICD-10-CM | POA: Diagnosis not present

## 2016-03-15 DIAGNOSIS — T63461D Toxic effect of venom of wasps, accidental (unintentional), subsequent encounter: Secondary | ICD-10-CM | POA: Diagnosis not present

## 2016-03-18 DIAGNOSIS — H25813 Combined forms of age-related cataract, bilateral: Secondary | ICD-10-CM | POA: Diagnosis not present

## 2016-03-18 DIAGNOSIS — H31001 Unspecified chorioretinal scars, right eye: Secondary | ICD-10-CM | POA: Diagnosis not present

## 2016-03-18 DIAGNOSIS — H43813 Vitreous degeneration, bilateral: Secondary | ICD-10-CM | POA: Diagnosis not present

## 2016-03-18 DIAGNOSIS — H524 Presbyopia: Secondary | ICD-10-CM | POA: Diagnosis not present

## 2016-03-24 DIAGNOSIS — G4733 Obstructive sleep apnea (adult) (pediatric): Secondary | ICD-10-CM | POA: Diagnosis not present

## 2016-04-05 DIAGNOSIS — I480 Paroxysmal atrial fibrillation: Secondary | ICD-10-CM | POA: Diagnosis not present

## 2016-04-05 DIAGNOSIS — G4733 Obstructive sleep apnea (adult) (pediatric): Secondary | ICD-10-CM | POA: Diagnosis not present

## 2016-04-05 DIAGNOSIS — I10 Essential (primary) hypertension: Secondary | ICD-10-CM | POA: Diagnosis not present

## 2016-04-05 DIAGNOSIS — E785 Hyperlipidemia, unspecified: Secondary | ICD-10-CM | POA: Diagnosis not present

## 2016-04-05 DIAGNOSIS — E668 Other obesity: Secondary | ICD-10-CM | POA: Diagnosis not present

## 2016-04-05 DIAGNOSIS — I2510 Atherosclerotic heart disease of native coronary artery without angina pectoris: Secondary | ICD-10-CM | POA: Diagnosis not present

## 2016-04-12 DIAGNOSIS — T63461D Toxic effect of venom of wasps, accidental (unintentional), subsequent encounter: Secondary | ICD-10-CM | POA: Diagnosis not present

## 2016-04-12 DIAGNOSIS — C61 Malignant neoplasm of prostate: Secondary | ICD-10-CM | POA: Diagnosis not present

## 2016-04-12 DIAGNOSIS — C672 Malignant neoplasm of lateral wall of bladder: Secondary | ICD-10-CM | POA: Diagnosis not present

## 2016-04-12 DIAGNOSIS — T63451D Toxic effect of venom of hornets, accidental (unintentional), subsequent encounter: Secondary | ICD-10-CM | POA: Diagnosis not present

## 2016-04-12 DIAGNOSIS — N131 Hydronephrosis with ureteral stricture, not elsewhere classified: Secondary | ICD-10-CM | POA: Diagnosis not present

## 2016-04-21 DIAGNOSIS — G4733 Obstructive sleep apnea (adult) (pediatric): Secondary | ICD-10-CM | POA: Diagnosis not present

## 2016-05-11 DIAGNOSIS — T63451D Toxic effect of venom of hornets, accidental (unintentional), subsequent encounter: Secondary | ICD-10-CM | POA: Diagnosis not present

## 2016-05-11 DIAGNOSIS — T63461D Toxic effect of venom of wasps, accidental (unintentional), subsequent encounter: Secondary | ICD-10-CM | POA: Diagnosis not present

## 2016-05-18 DIAGNOSIS — H5310 Unspecified subjective visual disturbances: Secondary | ICD-10-CM | POA: Diagnosis not present

## 2016-05-19 ENCOUNTER — Ambulatory Visit (HOSPITAL_COMMUNITY)
Admission: RE | Admit: 2016-05-19 | Discharge: 2016-05-19 | Disposition: A | Discharge status: Home / Self Care | Payer: PPO | Source: Ambulatory Visit | Attending: Vascular Surgery | Admitting: Vascular Surgery

## 2016-05-19 ENCOUNTER — Other Ambulatory Visit: Payer: Self-pay | Admitting: Cardiology

## 2016-05-19 DIAGNOSIS — I6523 Occlusion and stenosis of bilateral carotid arteries: Secondary | ICD-10-CM | POA: Diagnosis not present

## 2016-05-19 DIAGNOSIS — G458 Other transient cerebral ischemic attacks and related syndromes: Secondary | ICD-10-CM

## 2016-05-19 LAB — VAS US CAROTID
LCCADSYS: 59 cm/s
LCCAPDIAS: 21 cm/s
LCCAPSYS: 99 cm/s
LEFT ECA DIAS: -8 cm/s
Left CCA dist dias: 12 cm/s
Left ICA dist dias: -17 cm/s
Left ICA dist sys: -51 cm/s
Left ICA prox dias: 24 cm/s
Left ICA prox sys: 69 cm/s
RCCADSYS: -53 cm/s
RCCAPDIAS: 12 cm/s
RIGHT CCA MID DIAS: 16 cm/s
RIGHT ECA DIAS: -8 cm/s
Right CCA prox sys: 67 cm/s

## 2016-05-22 DIAGNOSIS — G4733 Obstructive sleep apnea (adult) (pediatric): Secondary | ICD-10-CM | POA: Diagnosis not present

## 2016-05-24 DIAGNOSIS — L509 Urticaria, unspecified: Secondary | ICD-10-CM | POA: Diagnosis not present

## 2016-05-24 DIAGNOSIS — T63441D Toxic effect of venom of bees, accidental (unintentional), subsequent encounter: Secondary | ICD-10-CM | POA: Diagnosis not present

## 2016-05-25 DIAGNOSIS — G459 Transient cerebral ischemic attack, unspecified: Secondary | ICD-10-CM | POA: Diagnosis not present

## 2016-05-25 DIAGNOSIS — E668 Other obesity: Secondary | ICD-10-CM | POA: Diagnosis not present

## 2016-05-25 DIAGNOSIS — I10 Essential (primary) hypertension: Secondary | ICD-10-CM | POA: Diagnosis not present

## 2016-05-25 DIAGNOSIS — I480 Paroxysmal atrial fibrillation: Secondary | ICD-10-CM | POA: Diagnosis not present

## 2016-05-25 DIAGNOSIS — G4733 Obstructive sleep apnea (adult) (pediatric): Secondary | ICD-10-CM | POA: Diagnosis not present

## 2016-05-25 DIAGNOSIS — E785 Hyperlipidemia, unspecified: Secondary | ICD-10-CM | POA: Diagnosis not present

## 2016-05-25 DIAGNOSIS — I2510 Atherosclerotic heart disease of native coronary artery without angina pectoris: Secondary | ICD-10-CM | POA: Diagnosis not present

## 2016-05-26 ENCOUNTER — Encounter: Payer: Self-pay | Admitting: Neurology

## 2016-05-27 ENCOUNTER — Other Ambulatory Visit: Payer: Self-pay | Admitting: Cardiology

## 2016-05-27 DIAGNOSIS — G459 Transient cerebral ischemic attack, unspecified: Secondary | ICD-10-CM

## 2016-05-28 DIAGNOSIS — G458 Other transient cerebral ischemic attacks and related syndromes: Secondary | ICD-10-CM | POA: Diagnosis not present

## 2016-05-28 DIAGNOSIS — G4733 Obstructive sleep apnea (adult) (pediatric): Secondary | ICD-10-CM | POA: Diagnosis not present

## 2016-06-08 DIAGNOSIS — G4733 Obstructive sleep apnea (adult) (pediatric): Secondary | ICD-10-CM | POA: Diagnosis not present

## 2016-06-10 ENCOUNTER — Ambulatory Visit
Admission: RE | Admit: 2016-06-10 | Discharge: 2016-06-10 | Disposition: A | Discharge status: Home / Self Care | Payer: PPO | Source: Ambulatory Visit | Attending: Cardiology | Admitting: Cardiology

## 2016-06-10 DIAGNOSIS — T63451D Toxic effect of venom of hornets, accidental (unintentional), subsequent encounter: Secondary | ICD-10-CM | POA: Diagnosis not present

## 2016-06-10 DIAGNOSIS — T63441D Toxic effect of venom of bees, accidental (unintentional), subsequent encounter: Secondary | ICD-10-CM | POA: Diagnosis not present

## 2016-06-10 DIAGNOSIS — G459 Transient cerebral ischemic attack, unspecified: Secondary | ICD-10-CM

## 2016-06-10 DIAGNOSIS — H547 Unspecified visual loss: Secondary | ICD-10-CM | POA: Diagnosis not present

## 2016-06-23 ENCOUNTER — Ambulatory Visit: Payer: PPO | Admitting: Neurology

## 2016-07-07 DIAGNOSIS — C61 Malignant neoplasm of prostate: Secondary | ICD-10-CM | POA: Diagnosis not present

## 2016-07-12 DIAGNOSIS — N135 Crossing vessel and stricture of ureter without hydronephrosis: Secondary | ICD-10-CM | POA: Diagnosis not present

## 2016-07-12 DIAGNOSIS — C61 Malignant neoplasm of prostate: Secondary | ICD-10-CM | POA: Diagnosis not present

## 2016-07-12 DIAGNOSIS — C672 Malignant neoplasm of lateral wall of bladder: Secondary | ICD-10-CM | POA: Diagnosis not present

## 2016-07-13 ENCOUNTER — Other Ambulatory Visit: Payer: Self-pay | Admitting: Urology

## 2016-07-13 DIAGNOSIS — C61 Malignant neoplasm of prostate: Secondary | ICD-10-CM

## 2016-07-14 DIAGNOSIS — T63451D Toxic effect of venom of hornets, accidental (unintentional), subsequent encounter: Secondary | ICD-10-CM | POA: Diagnosis not present

## 2016-07-14 DIAGNOSIS — T63461D Toxic effect of venom of wasps, accidental (unintentional), subsequent encounter: Secondary | ICD-10-CM | POA: Diagnosis not present

## 2016-08-04 ENCOUNTER — Ambulatory Visit (HOSPITAL_COMMUNITY)
Admission: RE | Admit: 2016-08-04 | Discharge: 2016-08-04 | Disposition: A | Discharge status: Home / Self Care | Payer: PPO | Source: Ambulatory Visit | Attending: Urology | Admitting: Urology

## 2016-08-04 DIAGNOSIS — C61 Malignant neoplasm of prostate: Secondary | ICD-10-CM | POA: Diagnosis not present

## 2016-08-04 LAB — POCT I-STAT CREATININE: CREATININE: 1.2 mg/dL (ref 0.61–1.24)

## 2016-08-04 MED ORDER — GADOBENATE DIMEGLUMINE 529 MG/ML IV SOLN: 20.0000 mL | Freq: Once | INTRAVENOUS | Status: DC | PRN

## 2016-08-06 ENCOUNTER — Encounter: Payer: Self-pay | Admitting: Neurology

## 2016-08-06 ENCOUNTER — Ambulatory Visit: Payer: PPO | Admitting: Neurology

## 2016-08-06 ENCOUNTER — Ambulatory Visit (INDEPENDENT_AMBULATORY_CARE_PROVIDER_SITE_OTHER): Payer: PPO | Admitting: Neurology

## 2016-08-06 ENCOUNTER — Ambulatory Visit (HOSPITAL_COMMUNITY): Payer: PPO

## 2016-08-06 VITALS — BP 118/80 | HR 61 | Ht 67.5 in | Wt 217.8 lb

## 2016-08-06 DIAGNOSIS — I10 Essential (primary) hypertension: Secondary | ICD-10-CM

## 2016-08-06 DIAGNOSIS — I480 Paroxysmal atrial fibrillation: Secondary | ICD-10-CM | POA: Diagnosis not present

## 2016-08-06 DIAGNOSIS — E785 Hyperlipidemia, unspecified: Secondary | ICD-10-CM

## 2016-08-06 DIAGNOSIS — H539 Unspecified visual disturbance: Secondary | ICD-10-CM | POA: Diagnosis not present

## 2016-08-06 MED ORDER — GADOBENATE DIMEGLUMINE 529 MG/ML IV SOLN
20.0000 mL | Freq: Once | INTRAVENOUS | Status: AC | PRN
  Administered 2016-08-04: 20 mL via INTRAVENOUS

## 2016-08-06 NOTE — Progress Notes (Signed)
NEUROLOGY CONSULTATION NOTE  Charles Wright MRN: [id number] DOB: [birth year]  Referring provider: Dr. Alyson Ingles Primary care provider: Dr. Alyson Ingles  Reason for consult:  Stroke  HISTORY OF PRESENT ILLNESS: Charles Wright is a 81 year old right-handed male with paroxysmal atrial fibrillation, CAD, hypertension, hyperlipidemia, hypothyroidism and sleep apnea who presents for transient ischemic attack.  History supplemented by PCP note.  In April, he was sitting when he suddenly had vision loss in the left lower quadrant of his visual field.  He is certain that it only involved the left eye (not the right eye).  It lasted 4 hours.  There was no associated headache, slurred speech, dizziness, or focal numbness or weakness.  He followed up with his ophthalmologist that day and had an unremarkable exam.  The next day, he had an episode of slightly skewed diplopia, again only in the left eye (he covered his left eye and his vision was fine).  It lasted 2 hours.  He had a carotid doppler on 05/19/16, which revealed only 1-39% stenosis in both ICA and antegrade flow in both vertebral arteries.    He has history of paroxysmal atrial fibrillation and was previously on Eliquis but discontinued it due to bleeding.  He was then on ASA and Plavix, but the Plavix was subsequently discontinued prior to this event.  Plavix was subsequently restarted.  12-lead EKG from 05/25/16 showed normal sinus rhythm with no arrhythmia.  He had an MRA of the head on 06/10/16, which was personally reviewed and revealed no medium or large vessel stenosis or occlusion.  An MRI of the brain was not performed.  2D echocardiogram shwoed EF 60% with no cardiac source of emboli.  He takes ASA 81mg  and Plavix.  He takes pravastatin 40mg  daily for hyperlipidemia.  For blood pressure, he takes amiodarone, isosorbide mononitrate, and metoprolol.  PAST MEDICAL HISTORY: Past Medical History:  Diagnosis Date  . Acute gout of left ankle    06-14-2015    . Bladder cancer (HCC)    BCG's tx's  . BPH (benign prostatic hypertrophy)   . CAD (coronary artery disease) CARDIOLOGIST-  DR TILLEY   a. NSTEMI 12/2014 -  99% dLAD-2 (not amenable to PCI), 50% dLAD-1, 60% mLAD. Medical therapy was recommended.  Marland Kitchen GERD (gastroesophageal reflux disease)    takes  OTC periodically  . Gilbert's syndrome   . H/O cardiac catheterization    a. Note: Difficult radial access in 12/2014 - recommend femoral approach if cath needed in the future.  Marland Kitchen History of kidney stones   . History of non-ST elevation myocardial infarction (NSTEMI)    12-12-2014   CARDIAC CATH W/ NO INTERVENTION  . Hyperlipidemia   . Hypertension   . OSA on CPAP   . Paroxysmal A-fib [facility])    cardiologist-  dr Wynonia Lawman  . Prostate cancer [facility])    last PSA  2.9  (montiored by urologist  dr Junious Silk)  . Wears glasses     PAST SURGICAL HISTORY: Past Surgical History:  Procedure Laterality Date  . BILATERAL INGUINAL HERNIA REPAIR    . CARDIAC CATHETERIZATION N/A 12/13/2014   Procedure: Left Heart Cath and Coronary Angiography;  Surgeon: Leonie Man, MD;  Location: [place] [facility];  Service: Cardiovascular;  Laterality: N/A;  culprit lesion diat LAD-2  99%, not approachable via PCTA  due to extremely tortuous up stream LAD/  mLAD 60% and dLAD-1 50%, both are at the extremely tortuous segment and not PCI targets/  otherwise normal coronary arteries  . CYSTOSCOPY W/ RETROGRADES Left 08/22/2012   Procedure: CYSTOSCOPY WITH RETROGRADE PYELOGRAM;  left kidney washings;  Surgeon: Fredricka Bonine, MD;  Location: [facility];  Service: Urology;  Laterality: Left;  . CYSTOSCOPY W/ RETROGRADES N/A 07/08/2015   Procedure: CYSTOSCOPY WITH RETROGRADE PYELOGRAM;  Surgeon: [name], MD;  Location: [facility];  Service: Urology;  Laterality: N/A;  . CYSTOSCOPY WITH BIOPSY  08/22/2012   Procedure: CYSTOSCOPY WITH BIOPSY;  Surgeon: Fredricka Bonine, MD;   Location: [facility];  Service: Urology;;  . [name] WITH BIOPSY N/A 11/08/2014   Procedure: CYSTOSCOPY/BIOPSPY BLADDER INSTILLATION OF MARCAINE AND PYRIDIUM;  Surgeon: [name], MD;  Location: [facility];  Service: Urology;  Laterality: N/A;  . CYSTOSCOPY WITH BIOPSY N/A 07/08/2015   Procedure: CYSTOSCOPY WITH BLADDER BIOPSY, FULGURATION, RETROGRADE PYLOGRAM AND RENAL WASHING;  Surgeon: [name], MD;  Location: [facility];  Service: Urology;  Laterality: N/A;  . CYSTOSCOPY WITH RETROGRADE PYELOGRAM, URETEROSCOPY AND STENT PLACEMENT Bilateral 07/30/2014   Procedure: CYSTOSCOPY WITH BLADDER BX FULERGATION AND BILATERAL RETROGRADE PYELOGRAM,;  Surgeon: [name], MD;  Location: [facility];  Service: Urology;  Laterality: Bilateral;  . CYSTOSCOPY/RETROGRADE/URETEROSCOPY Bilateral 08/17/2013   Procedure: CYSTOSCOPY, BLADDER BIOPSY WITH BILATERAL RETROGRADE PYELOGRAM, LEFT URETEROSCOPY AND DILATION OF STRICTURE ;  Surgeon: [name], MD;  Location: [facility];  Service: Urology;  Laterality: Bilateral;  . LEFT ATRIAL APPENDAGE OCCLUSION N/A 01/09/2015   Procedure: LEFT ATRIAL APPENDAGE OCCLUSION;  Surgeon: [name], MD;  Location: [place] [facility];  Service: Cardiovascular;  Laterality: N/A;  . LUMBAR LAMINECTOMY/DECOMPRESSION MICRODISCECTOMY Left 12/03/2013   Procedure: Left Lumbar three-four diskectomy with Lumbar four laminectomy ;  Surgeon: Newman Pies, MD;  Location: [place] [facility];  Service: Neurosurgery;  Laterality: Left;  Left Lumbar three-four diskectomy with Lumbar four laminectomy   . ORIF LEFT ANKLE FX  2005  . PROSTATE BIOPSY N/A 08/22/2012   Procedure: BIOPSY TRANSRECTAL ULTRASONIC PROSTATE (TUBP);  Surgeon: Fredricka Bonine, MD;  Location: [facility];  Service: Urology;  Laterality: N/A;  . TEE WITHOUT CARDIOVERSION N/A 12/31/2014   Procedure: TRANSESOPHAGEAL  ECHOCARDIOGRAM (TEE);  Surgeon: Pixie Casino, MD;  [facility];  Service: Cardiovascular;  Laterality: N/A;  ef 55-60%/  mild MR, TR, and PR/  mild LAE and RAE  . TRANSURETHRAL RESECTION OF BLADDER TUMOR  10/22/2011   Procedure: TRANSURETHRAL RESECTION OF BLADDER TUMOR (TURBT);  Surgeon: Fredricka Bonine, MD;  Location: [facility];  Service: Urology;  Laterality: N/A;  TURBT, LEFT URETEROSCOPY, POSSIBLE URETERAL STENT   . URETEROSCOPY  10/22/2011   Procedure: URETEROSCOPY;  Surgeon: Fredricka Bonine, MD;  Location: [facility];  Service: Urology;  Laterality: Left;  . WISDOM TOOTH EXTRACTION      MEDICATIONS: Current Outpatient Prescriptions on File Prior to Visit  Medication Sig Dispense Refill  . allopurinol (ZYLOPRIM) 300 MG tablet Take 300 mg by mouth every morning.     Marland Kitchen amiodarone (PACERONE) 200 MG tablet Take 200 mg by mouth every morning.   0  . aspirin EC 81 MG EC tablet Take 1 tablet (81 mg total) by mouth daily.    . Cholecalciferol (VITAMIN D-3) 1000 UNITS CAPS Take 1,000 Units by mouth 2 (two) times daily.     . clopidogrel (PLAVIX) 75 MG tablet Take 75 mg by mouth every morning.    Marland Kitchen EPINEPHrine 0.3 mg/0.3 mL  IJ SOAJ injection Inject 0.3 mLs (0.3 mg total) into the muscle once. 2 Device 1  . Glucosamine Sulfate (DONA PO) Take 1 tablet by mouth 2 (two) times daily.     Marland Kitchen HYDROcodone-acetaminophen (NORCO/VICODIN) 5-325 MG tablet Take 1 tablet by mouth every 4 (four) hours as needed. 10 tablet 0  . isosorbide mononitrate (IMDUR) 30 MG 24 hr tablet Take 1 tablet (30 mg total) by mouth daily. (Patient taking differently: Take 30 mg by mouth every evening. ) 30 tablet 12  . levothyroxine (SYNTHROID, LEVOTHROID) 50 MCG tablet Take 50 mcg by mouth daily before breakfast.    . metoprolol (LOPRESSOR) 50 MG tablet Take 50 mg by mouth 2 (two) times daily.    . Multiple Vitamin (MULTIVITAMIN) tablet Take 1 tablet by mouth 2 (two) times  daily.     . nitroGLYCERIN (NITROSTAT) 0.4 MG SL tablet Place 1 tablet (0.4 mg total) under the tongue every 5 (five) minutes x 3 doses as needed for chest pain. 25 tablet 12  . pravastatin (PRAVACHOL) 40 MG tablet Take 1 tablet (40 mg total) by mouth daily. (Patient taking differently: Take 40 mg by mouth every morning. ) 30 tablet    No current facility-administered medications on file prior to visit.     ALLERGIES: Allergies  Allergen Reactions  . Bee Venom Anaphylaxis    Actually Hornets and Wasps.   . Losartan Other (See Comments)    Cough   . Oxycodone Other (See Comments)    ALTERED MENTAL STATIS    FAMILY HISTORY: Family History  Problem Relation Age of Onset  . Leukemia Mother   . Heart attack Father   . Cancer Sister   . Hypertension Neg Hx   . Stroke Neg Hx     SOCIAL HISTORY: Social History   Social History  . Marital status: Married    Spouse name: N/A  . Number of children: N/A  . Years of education: N/A   Occupational History  . Retired Other   Social History Main Topics  . Smoking status: Never Smoker  . Smokeless tobacco: Never Used  . Alcohol use 4.2 oz/week    7 Glasses of wine per week     Comment: red wine glass daily  . Drug use: No  . Sexual activity: Not on file   Other Topics Concern  . Not on file   Social History Narrative  . No narrative on file    REVIEW OF SYSTEMS: Constitutional: No fevers, chills, or sweats, no generalized fatigue, change in appetite Eyes: No visual changes, double vision, eye pain Ear, nose and throat: No hearing loss, ear pain, nasal congestion, sore throat Cardiovascular: No chest pain, palpitations Respiratory:  No shortness of breath at rest or with exertion, wheezes GastrointestinaI: No nausea, vomiting, diarrhea, abdominal pain, fecal incontinence Genitourinary:  No dysuria, urinary retention or frequency Musculoskeletal:  No neck pain, back pain Integumentary: No rash, pruritus, skin  lesions Neurological: as above Psychiatric: No depression, insomnia, anxiety Endocrine: No palpitations, fatigue, diaphoresis, mood swings, change in appetite, change in weight, increased thirst Hematologic/Lymphatic:  No purpura, petechiae. Allergic/Immunologic: no itchy/runny eyes, nasal congestion, recent allergic reactions, rashes  PHYSICAL EXAM: Vitals:   08/06/16 0956  BP: 118/80  Pulse: 61   General: No acute distress.  Patient appears well-groomed.  Head:  Normocephalic/atraumatic Eyes:  fundi examined but not visualized Neck: supple, no paraspinal tenderness, full range of motion Back: No paraspinal tenderness Heart: regular rate and rhythm Lungs: Clear to  auscultation bilaterally. Vascular: No carotid bruits. Neurological Exam: Mental status: alert and oriented to person, place, and time, recent and remote memory intact, fund of knowledge intact, attention and concentration intact, speech fluent and not dysarthric, language intact. Cranial nerves: CN I: not tested CN II: pupils equal, round and reactive to light, visual fields intact CN III, IV, VI:  full range of motion, no nystagmus, no ptosis CN V: facial sensation intact CN VII: upper and lower face symmetric CN VIII: hearing intact CN IX, X: gag intact, uvula midline CN XI: sternocleidomastoid and trapezius muscles intact CN XII: tongue midline Bulk & Tone: normal, no fasciculations. Motor:  5/5 throughout  Sensation: temperature and vibration sensation intact. Deep Tendon Reflexes:  2+ throughout, toes downgoing.  Finger to nose testing:  Without dysmetria.  Heel to shin:  Without dysmetria.  Gait:  Normal station and stride.  Able to turn. Romberg negative.  IMPRESSION: Transient visual disturbance in left eye.  Monocular vision loss or diplopia is typically due to a disturbance of the eye, not the brain (however, a stroke involving the anterior calcarine cortex may present with monocular visual field  defect, but not monocular diplopia).  Ocular migraine is possible, although he has no history of headaches or migraines.  PLAN: 1.  I would like to get an MRI of the brain to see if there is evidence of an occipital infarct. 2.  He has stroke risk factors, and thus I would treat for possible stroke.  - With PAF, anticoagulation would be preferable to antiplatelet therapy.  Regarding the bleeding, must weigh risks and benefits.  - Continue statin therapy as managed by PCP (LDL goal should be less than 70)  - Continue blood pressure control 3.  Follow up in 6 months for recheck.  Thank you for allowing me to take part in the care of this patient.  Metta Clines, DO  CC:  Maury Dus, MD  Landry Corporal, MD

## 2016-08-06 NOTE — Patient Instructions (Addendum)
Typically visual symptoms in just one eye is related to the eye and not the brain.  However, I really would like to get an MRI of the brain.  I will check with Dr. Thurman Coyer office to see if this was done.  I will also get a report of your recent echocardiogram  [facility] will be contacting you with your MRI/Brain appointment   Otherwise, continue the blood thinners and pravastatin Follow up in 6 months.

## 2016-08-10 DIAGNOSIS — T63461D Toxic effect of venom of wasps, accidental (unintentional), subsequent encounter: Secondary | ICD-10-CM | POA: Diagnosis not present

## 2016-08-10 DIAGNOSIS — T63451D Toxic effect of venom of hornets, accidental (unintentional), subsequent encounter: Secondary | ICD-10-CM | POA: Diagnosis not present

## 2016-08-19 IMAGING — CR DG ANKLE 2V *L*
2 series · 2 of 2 positions shown · non-contrast
Comparison: 06/10/2003 and 01/28/2010 radiographs

CLINICAL DATA: 79-year-old male with acute left ankle pain and
swelling for 3 days. History of gout.

EXAM:
LEFT ANKLE - 2 VIEW

[x ankle ap left]
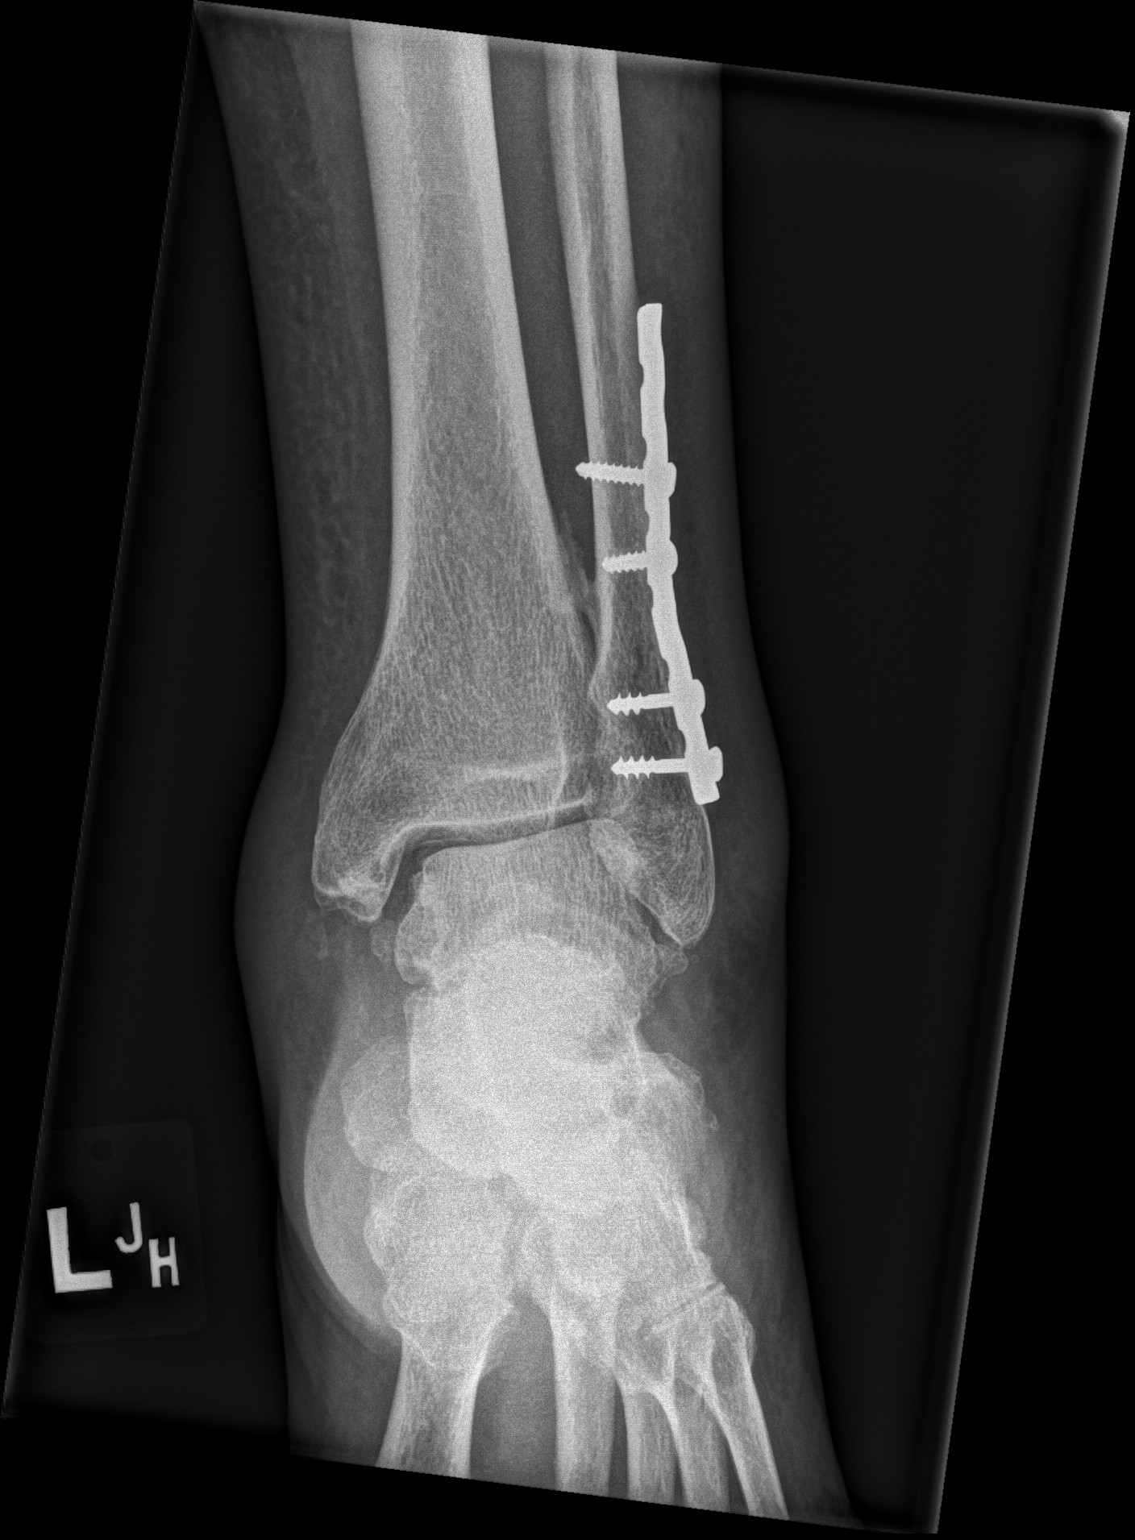

[x ankle lat left]
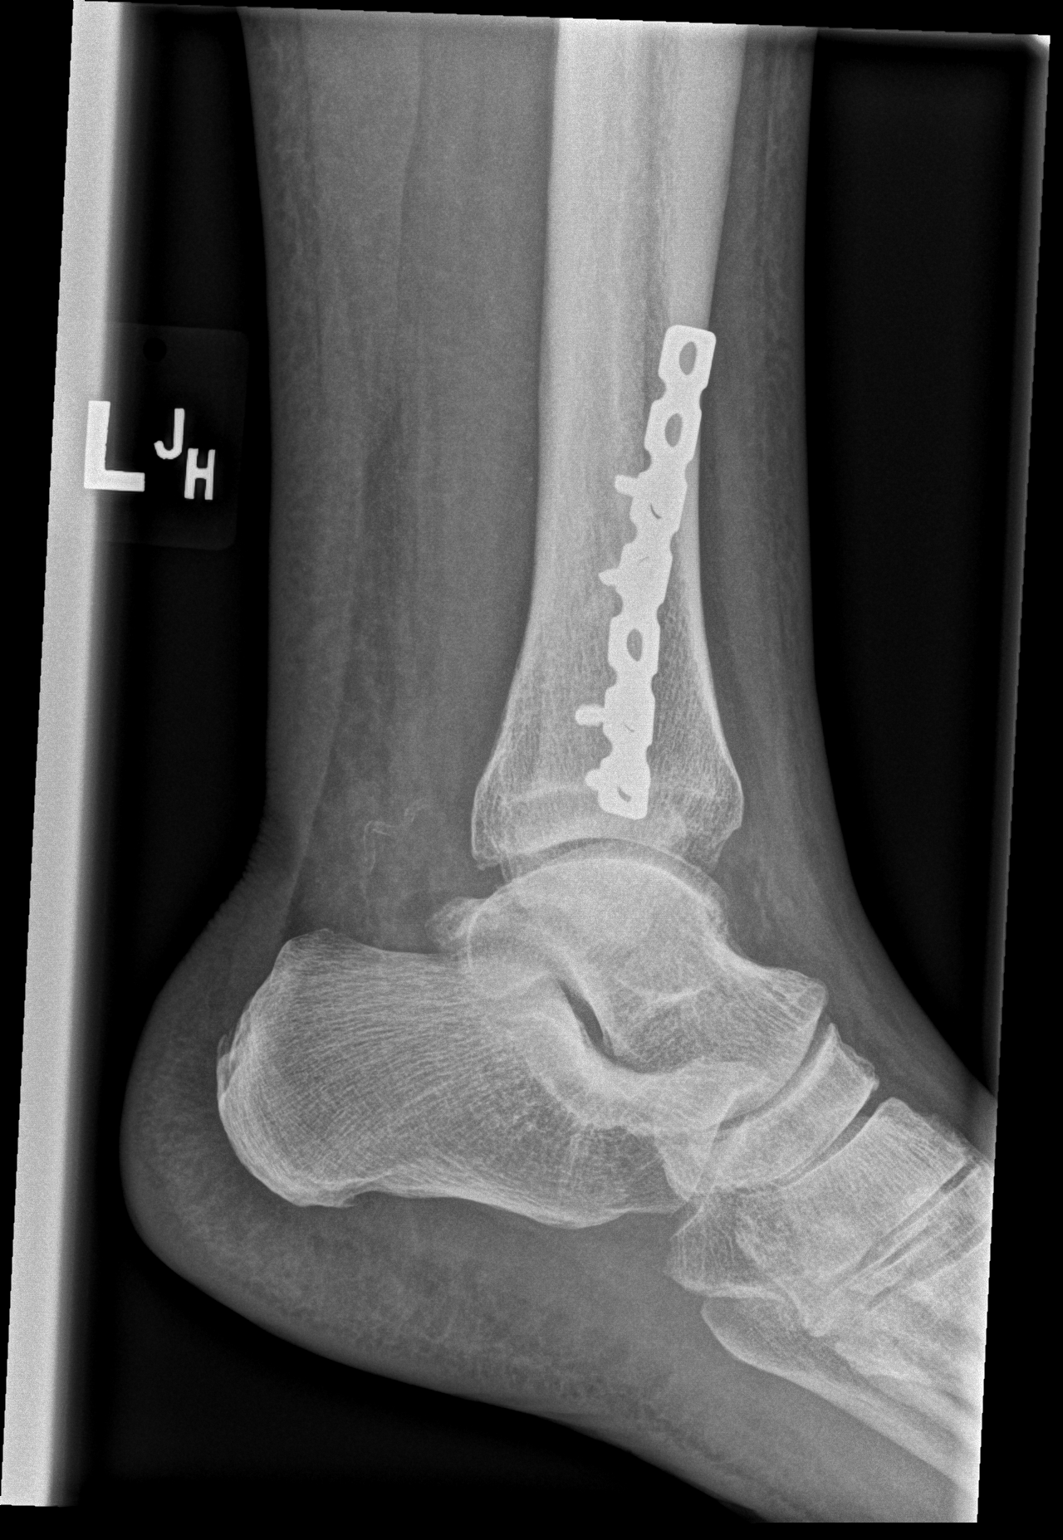

[2 of 2 positions shown; findings below may reference images not displayed]

FINDINGS: Soft tissue swelling is noted.

No acute fracture, subluxation, dislocation or definite erosions
noted.

Internal plate and screw fixation of the distal fibula identified.

Mild degenerative changes of the tibiotalar joint are present.

Vascular calcifications are noted.
IMPRESSION: Soft tissue swelling without acute bony abnormality.

## 2016-08-20 ENCOUNTER — Telehealth: Payer: Self-pay

## 2016-08-20 ENCOUNTER — Ambulatory Visit
Admission: RE | Admit: 2016-08-20 | Discharge: 2016-08-20 | Disposition: A | Discharge status: Home / Self Care | Payer: PPO | Source: Ambulatory Visit | Attending: Neurology | Admitting: Neurology

## 2016-08-20 DIAGNOSIS — H5462 Unqualified visual loss, left eye, normal vision right eye: Secondary | ICD-10-CM | POA: Diagnosis not present

## 2016-08-20 DIAGNOSIS — H539 Unspecified visual disturbance: Secondary | ICD-10-CM

## 2016-08-20 NOTE — Telephone Encounter (Signed)
-----   Message from Pieter Partridge, DO sent at 08/20/2016 12:19 PM EDT ----- MRI reviewed and reveals no stroke or anything concerning from the brain to explain the visual disturbance.  It shows age-related changes that we see in people with history of high blood pressure and high cholesterol.

## 2016-08-20 NOTE — Telephone Encounter (Signed)
Called patient. Gave MRI results. Patient verbalized understanding.  

## 2016-09-15 DIAGNOSIS — T63451D Toxic effect of venom of hornets, accidental (unintentional), subsequent encounter: Secondary | ICD-10-CM | POA: Diagnosis not present

## 2016-09-15 DIAGNOSIS — T63461D Toxic effect of venom of wasps, accidental (unintentional), subsequent encounter: Secondary | ICD-10-CM | POA: Diagnosis not present

## 2016-09-20 DIAGNOSIS — C61 Malignant neoplasm of prostate: Secondary | ICD-10-CM | POA: Diagnosis not present

## 2016-09-30 ENCOUNTER — Encounter: Payer: Self-pay | Admitting: Radiation Oncology

## 2016-10-04 DIAGNOSIS — E785 Hyperlipidemia, unspecified: Secondary | ICD-10-CM | POA: Diagnosis not present

## 2016-10-04 DIAGNOSIS — E668 Other obesity: Secondary | ICD-10-CM | POA: Diagnosis not present

## 2016-10-04 DIAGNOSIS — I119 Hypertensive heart disease without heart failure: Secondary | ICD-10-CM | POA: Diagnosis not present

## 2016-10-04 DIAGNOSIS — I2510 Atherosclerotic heart disease of native coronary artery without angina pectoris: Secondary | ICD-10-CM | POA: Diagnosis not present

## 2016-10-04 DIAGNOSIS — Z8673 Personal history of transient ischemic attack (TIA), and cerebral infarction without residual deficits: Secondary | ICD-10-CM | POA: Diagnosis not present

## 2016-10-04 DIAGNOSIS — G4733 Obstructive sleep apnea (adult) (pediatric): Secondary | ICD-10-CM | POA: Diagnosis not present

## 2016-10-04 DIAGNOSIS — I480 Paroxysmal atrial fibrillation: Secondary | ICD-10-CM | POA: Diagnosis not present

## 2016-10-04 DIAGNOSIS — I10 Essential (primary) hypertension: Secondary | ICD-10-CM | POA: Diagnosis not present

## 2016-10-05 NOTE — Progress Notes (Signed)
GU Location of Tumor / Histology: Prostate Adenocarcinoma  If Prostate Cancer, Gleason Score is (3 + 4) on 09/20/2016 and PSA is (3.52) on 07/07/2016  Charles Wright has been under active surveillance with Dr. Junious Silk at [facility].  Patient was diagnosed in 2016 for Bladder cancer treated with BCG intravesical therapy August 2016.  Biopsies of prostate revealed:      Past/Anticipated interventions by urology, if any: TURBT, Numerous Cystoscopy, Bladder Cancer Treatment  Past/Anticipated interventions by medical oncology, if any: No  Weight changes, if any: no  Bowel/Bladder complaints, if any: IPSS 1. Denies dysuria, hematuria, leakage or incontinence.  Nausea/Vomiting, if any: no  Pain issues, if any:  no  SAFETY ISSUES:  Prior radiation? no  Pacemaker/ICD? no  Possible current pregnancy? no  Is the patient on methotrexate? no  Current Complaints / other details:  81 year old. Married.

## 2016-10-06 NOTE — Progress Notes (Signed)
Radiation Oncology         [phone] ________________________________  Initial outpatient Consultation  Name: Charles Wright MRN: [id number]  Date: 10/07/2016  DOB: 01-21-1936  EX:HBZJI, Herbie [place], MD  [name], MD   REFERRING PHYSICIAN: [name], MD  DIAGNOSIS: 81 year-old gentleman with Stage T2a adenocarcinoma of the prostate with Gleason Score of 3+4, and PSA of 3.52.    ICD-10-CM   1. Prostate cancer [facility]) C61     HISTORY OF PRESENT ILLNESS: Charles Wright is a 81 y.o. male with a diagnosis of prostate cancer. He was initially diagnosed with a Gleason 3+3 prostate cancer by Dr. Junious Silk in July 2014 with PSA of 3.81,  digital rectal examination was performed at that time revealing a right apical nodule.  The patient proceeded to transrectal ultrasound with 12 biopsies of the prostate in 08/2016.  The prostate volume measured 42 cc.  Out of 12 core biopsies,1 was positive.  The maximum Gleason score was 3+3, and this was seen in right mid gland.  The right apex core revealed inflammation only.  The patient opted to proceed with active surveillance.   04/16/13 PSA 2.86 010/7/16 PSA 2.69 02/25/15 PSA 3.40 11/12/15 PSA 3.90 07/07/16 PSA 3.52 DRE with new 10 mm nodule on right base  Patient underwent prostate MRI which revealed a PI-RADS 4 right base lesion with possible ECE and NVI.  Negative SVI, LAD or osseous metastasis.  Prostate volume measured 31 cc.  MRI fusion TRUSPBx 09/20/16 showed Gleason 3+4 disease in the right base and 3+3 disease in the right base lateral and left apex.  TRUS Vol. 34.53    The patient reviewed the biopsy results with his urologist and he has kindly been referred today for discussion of potential radiation treatment options.  Of note, the patient has a history of bladder cancer that was superficial and confined to the bladder wall, treated with TURBT and BCG in 2013.  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  Last cysto was normal in 04/2016 with negative bx in 06/2015.  PREVIOUS RADIATION THERAPY: No  PAST MEDICAL HISTORY:  Past Medical History:  Diagnosis Date  . Acute gout of left ankle    06-14-2015  . Bladder cancer (HCC)    BCG's tx's  . BPH (benign prostatic hypertrophy)   . CAD (coronary artery disease) CARDIOLOGIST-  DR TILLEY   a. NSTEMI 12/2014 -  99% dLAD-2 (not amenable to PCI), 50% dLAD-1, 60% mLAD. Medical therapy was recommended.  Marland Kitchen GERD (gastroesophageal reflux disease)    takes  OTC periodically  . Gilbert's syndrome   . H/O cardiac catheterization    a. Note: Difficult radial access in 12/2014 - recommend femoral approach if cath needed in the future.  Marland Kitchen History of kidney stones   . History of non-ST elevation myocardial infarction (NSTEMI)    12-12-2014   CARDIAC CATH W/ NO INTERVENTION  . Hyperlipidemia   . Hypertension   . OSA on CPAP   . Paroxysmal A-fib [facility])    cardiologist-  dr Wynonia Lawman  . Prostate cancer [facility])    last PSA  2.9  (montiored by urologist  dr Junious Silk)  . Wears glasses       PAST SURGICAL HISTORY: Past Surgical History:  Procedure Laterality Date  . BILATERAL INGUINAL HERNIA REPAIR    . CARDIAC CATHETERIZATION N/A 12/13/2014   Procedure: Left Heart Cath and Coronary Angiography;  Surgeon: Leonie Man, MD;  Location: [place] [facility];  Service: Cardiovascular;  Laterality: N/A;  culprit lesion diat LAD-2  99%, not approachable via PCTA  due to extremely tortuous up stream LAD/  mLAD 60% and dLAD-1 50%, both are at the extremely tortuous segment and not PCI targets/  otherwise normal coronary arteries  . CYSTOSCOPY W/ RETROGRADES Left 08/22/2012   Procedure: CYSTOSCOPY WITH RETROGRADE PYELOGRAM;  left kidney washings;  Surgeon: Fredricka Bonine, MD;  Location: [facility];  Service: Urology;  Laterality: Left;  . CYSTOSCOPY W/ RETROGRADES N/A 07/08/2015   Procedure: CYSTOSCOPY WITH RETROGRADE  PYELOGRAM;  Surgeon: [name], MD;  Location: [facility];  Service: Urology;  Laterality: N/A;  . CYSTOSCOPY WITH BIOPSY  08/22/2012   Procedure: CYSTOSCOPY WITH BIOPSY;  Surgeon: Fredricka Bonine, MD;  Location: [facility];  Service: Urology;;  . [name] WITH BIOPSY N/A 11/08/2014   Procedure: CYSTOSCOPY/BIOPSPY BLADDER INSTILLATION OF MARCAINE AND PYRIDIUM;  Surgeon: [name], MD;  Location: [facility];  Service: Urology;  Laterality: N/A;  . CYSTOSCOPY WITH BIOPSY N/A 07/08/2015   Procedure: CYSTOSCOPY WITH BLADDER BIOPSY, FULGURATION, RETROGRADE PYLOGRAM AND RENAL WASHING;  Surgeon: [name], MD;  Location: [facility];  Service: Urology;  Laterality: N/A;  . CYSTOSCOPY WITH RETROGRADE PYELOGRAM, URETEROSCOPY AND STENT PLACEMENT Bilateral 07/30/2014   Procedure: CYSTOSCOPY WITH BLADDER BX FULERGATION AND BILATERAL RETROGRADE PYELOGRAM,;  Surgeon: [name], MD;  Location: [facility];  Service: Urology;  Laterality: Bilateral;  . CYSTOSCOPY/RETROGRADE/URETEROSCOPY Bilateral 08/17/2013   Procedure: CYSTOSCOPY, BLADDER BIOPSY WITH BILATERAL RETROGRADE PYELOGRAM, LEFT URETEROSCOPY AND DILATION OF STRICTURE ;  Surgeon: [name], MD;  Location: [facility];  Service: Urology;  Laterality: Bilateral;  . LEFT ATRIAL APPENDAGE OCCLUSION N/A 01/09/2015   Procedure: LEFT ATRIAL APPENDAGE OCCLUSION;  Surgeon: [name], MD;  Location: [place] [facility];  Service: Cardiovascular;  Laterality: N/A;  . LUMBAR LAMINECTOMY/DECOMPRESSION MICRODISCECTOMY Left 12/03/2013   Procedure: Left Lumbar three-four diskectomy with Lumbar four laminectomy ;  Surgeon: Newman Pies, MD;  Location: [place] [facility];  Service: Neurosurgery;  Laterality: Left;  Left Lumbar three-four diskectomy with Lumbar four laminectomy   . ORIF LEFT ANKLE FX  2005  . PROSTATE BIOPSY N/A 08/22/2012   Procedure: BIOPSY  TRANSRECTAL ULTRASONIC PROSTATE (TUBP);  Surgeon: Fredricka Bonine, MD;  Location: [facility];  Service: Urology;  Laterality: N/A;  . TEE WITHOUT CARDIOVERSION N/A 12/31/2014   Procedure: TRANSESOPHAGEAL ECHOCARDIOGRAM (TEE);  Surgeon: Pixie Casino, MD;  Location: [facility];  Service: Cardiovascular;  Laterality: N/A;  ef 55-60%/  mild MR, TR, and PR/  mild LAE and RAE  . TRANSURETHRAL RESECTION OF BLADDER TUMOR  10/22/2011   Procedure: TRANSURETHRAL RESECTION OF BLADDER TUMOR (TURBT);  Surgeon: Fredricka Bonine, MD;  Location: [facility];  Service: Urology;  Laterality: N/A;  TURBT, LEFT URETEROSCOPY, POSSIBLE URETERAL STENT   . URETEROSCOPY  10/22/2011   Procedure: URETEROSCOPY;  Surgeon: Fredricka Bonine, MD;  Location: [facility];  Service: Urology;  Laterality: Left;  . WISDOM TOOTH EXTRACTION      FAMILY HISTORY:  Family History  Problem Relation Age of Onset  . Leukemia Mother   . Cancer Mother        leukemia  . Heart attack Father   . Cancer Sister        breast  . Hypertension Neg Hx   . Stroke  Neg Hx     SOCIAL HISTORY:  Social History   Social History  . Marital status: Married    Spouse name: N/A  . Number of children: N/A  . Years of education: N/A   Occupational History  . Retired Other   Social History Main Topics  . Smoking status: Never Smoker  . Smokeless tobacco: Never Used  . Alcohol use 4.2 oz/week    7 Glasses of wine per week     Comment: red wine glass daily  . Drug use: No  . Sexual activity: Yes   Other Topics Concern  . Not on file   Social History Narrative  . No narrative on file    ALLERGIES: Bee venom; Losartan; and Oxycodone  MEDICATIONS:  Current Outpatient Prescriptions  Medication Sig Dispense Refill  . allopurinol (ZYLOPRIM) 300 MG tablet Take 300 mg by mouth every morning.     Marland Kitchen amiodarone (PACERONE) 200 MG tablet Take 200 mg by mouth every morning.    0  . aspirin EC 81 MG EC tablet Take 1 tablet (81 mg total) by mouth daily.    . Cholecalciferol (VITAMIN D-3) 1000 UNITS CAPS Take 1,000 Units by mouth 2 (two) times daily.     . clopidogrel (PLAVIX) 75 MG tablet Take 75 mg by mouth every morning.    . Colchicine 0.6 MG CAPS Take by mouth. As needed for gout    . isosorbide mononitrate (IMDUR) 30 MG 24 hr tablet Take 1 tablet (30 mg total) by mouth daily. (Patient taking differently: Take 30 mg by mouth every evening. ) 30 tablet 12  . levothyroxine (SYNTHROID, LEVOTHROID) 50 MCG tablet Take 50 mcg by mouth daily before breakfast.    . metoprolol (LOPRESSOR) 50 MG tablet Take 50 mg by mouth 2 (two) times daily.    . Multiple Vitamin (MULTIVITAMIN) tablet Take 1 tablet by mouth 2 (two) times daily.     . pravastatin (PRAVACHOL) 40 MG tablet Take 1 tablet (40 mg total) by mouth daily. (Patient taking differently: Take 40 mg by mouth every morning. ) 30 tablet   . diazepam (VALIUM) 10 MG tablet     . EPINEPHrine 0.3 mg/0.3 mL IJ SOAJ injection Inject 0.3 mLs (0.3 mg total) into the muscle once. (Patient not taking: Reported on 10/07/2016) 2 Device 1  . Glucosamine Sulfate (DONA PO) Take 1 tablet by mouth 2 (two) times daily.     Marland Kitchen HYDROcodone-acetaminophen (NORCO/VICODIN) 5-325 MG tablet Take 1 tablet by mouth every 4 (four) hours as needed. (Patient not taking: Reported on 10/07/2016) 10 tablet 0  . nitroGLYCERIN (NITROSTAT) 0.4 MG SL tablet Place 1 tablet (0.4 mg total) under the tongue every 5 (five) minutes x 3 doses as needed for chest pain. (Patient not taking: Reported on 10/07/2016) 25 tablet 12   No current facility-administered medications for this encounter.     REVIEW OF SYSTEMS:  On review of systems, the patient reports that he is doing well overall. He denies any chest pain, shortness of breath, cough, fevers, chills, night sweats, unintended weight changes. He denies any bowel disturbances, and denies abdominal pain, nausea or  vomiting. He denies any new musculoskeletal or joint aches or pains. His IPSS was 1, indicating mild urinary symptoms. He denies dysuria, gross hematuria, weak stream, incomplete emptying, or incontinence. He has nocturia x 1/night and denies daytime frequency or urgency.  He has mild ED. He reports A fib, for which he takes Plavix and aspirin daily, as well  as metoprolol for rate control. He denies CAD requiring cardiac stents or pacer/defibrillator.  He denies asthma or COPD. A complete review of systems is obtained and is otherwise negative.  PHYSICAL EXAM:  Wt Readings from Last 3 Encounters:  10/07/16 217 lb (98.4 kg)  08/06/16 217 lb 12.8 oz (98.8 kg)  07/08/15 216 lb (98 kg)   Temp Readings from Last 3 Encounters:  10/07/16 98 F (36.7 C) (Oral)  07/08/15 (!) 96.8 F (36 C)  06/14/15 97.9 F (36.6 C) (Oral)   BP Readings from Last 3 Encounters:  10/07/16 115/73  08/06/16 118/80  07/08/15 115/75   Pulse Readings from Last 3 Encounters:  10/07/16 (!) 54  08/06/16 61  07/08/15 (!) 51   Pain Assessment Pain Score: 0-No pain/10  In general this is a well appearing caucasian male in no acute distress. He is alert and oriented x4 and appropriate throughout the examination. HEENT reveals that the patient is normocephalic, atraumatic. EOMs are intact. PERRLA. Skin is intact without any evidence of gross lesions. Cardiovascular exam reveals a regular rate and rhythm, no clicks rubs or murmurs are auscultated. Chest is clear to auscultation bilaterally. Lymphatic assessment is performed and does not reveal any adenopathy in the cervical, supraclavicular or axillary chains. Abdomen has active bowel sounds in all quadrants and is intact. The abdomen is soft, non tender, non distended. Lower extremities are negative for pretibial pitting edema, deep calf tenderness, cyanosis or clubbing.    KPS = 100  100 - Normal; no complaints; no evidence of disease. 90   - Able to carry on normal  activity; minor signs or symptoms of disease. 80   - Normal activity with effort; some signs or symptoms of disease. 75   - Cares for self; unable to carry on normal activity or to do active work. 60   - Requires occasional assistance, but is able to care for most of his personal needs. 50   - Requires considerable assistance and frequent medical care. 28   - Disabled; requires special care and assistance. 20   - Severely disabled; hospital admission is indicated although death not imminent. 30   - Very sick; hospital admission necessary; active supportive treatment necessary. 10   - Moribund; fatal processes progressing rapidly. 0     - Dead  Karnofsky DA, Abelmann [place], Craver LS and Burchenal [facility] [phone]) The use of the nitrogen mustards in the palliative treatment of carcinoma: with particular reference to bronchogenic carcinoma Cancer 1 634-56  LABORATORY DATA:  Lab Results  Component Value Date   WBC 12.0 (H) 06/14/2015   HGB 15.5 06/14/2015   HCT 44.9 06/14/2015   MCV 90.2 06/14/2015   PLT 204 06/14/2015   Lab Results  Component Value Date   NA 133 (L) 06/14/2015   K 5.1 06/14/2015   CL 102 06/14/2015   CO2 27 06/14/2015   Lab Results  Component Value Date   ALT 24 12/14/2014   AST 26 12/14/2014   ALKPHOS 53 12/14/2014   BILITOT 0.6 12/14/2014     RADIOGRAPHY: No results found.    IMPRESSION/PLAN: 50. 81 year-old gentleman with Stage T2a adenocarcinoma of the prostate with Gleason Score of 3+4, and PSA of 3.52.  Today, we discussed the patient's workup and outlines the nature of prostate cancer in this setting. The patient's T stage, Gleason's score, and PSA put him into the intermediate risk group. Accordingly, he is eligible for a variety of potential treatment options including brachytherapy, 8  weeks of external radiation or 5 weeks of external radiation followed by a brachytherapy boost. We discussed the available radiation techniques, and focused on the details and  logistics and delivery.  We discussed and outlined the risks, benefits, short and long-term effects associated with radiotherapy and compared and contrasted these with prostatectomy. He is not interested in prostate brachytherapy or prostatectomy. We also detailed the role of ADT in the treatment of intermediate risk prostate cancer and outlined the associated side effects that could be expected with this therapy.  At the end of the conversation the patient is interested in moving forward with 8 weeks of external beam radiotherapy in combination with ADT. He has not had placement of gold fiducial markers. We will contact [facility] to make arrangements for initiation of ADT as well as fiducial marker placement prior to simulation. He will be scheduled for simulation in late January 2019 in preparation to begin prostate IMRT in early February 2019. We will share our discussion with Dr. Junious Silk and move forward with scheduling a follow up visit for ADT and placement of 3 gold fiducial markers into the prostate.  The patient is interested in postponing the start of radiation treatment until the beginning of February 2019 as he has several trips planned in December and January. In the interim, he will receive androgen deprivation therapy.    We spent 60 minutes face to face with the patient and more than 50% of that time was spent in counseling and/or coordination of care.    Nicholos Johns, PA-C    Tyler Pita, MD  [facility] Direct Dial: [phone]  Fax: [phone] [email or web address]  Skype  [email or web address]  This document serves as a record of services personally performed by Tyler Pita, MD and Freeman Caldron, PA-C. It was created on their behalf by Arlyce Harman, a trained medical scribe. The creation of this record is based on the scribe's personal observations and the provider's statements to them. This document has been checked and approved by the attending  provider.

## 2016-10-07 ENCOUNTER — Ambulatory Visit
Admission: RE | Admit: 2016-10-07 | Discharge: 2016-10-07 | Disposition: A | Discharge status: Home / Self Care | Payer: PPO | Source: Ambulatory Visit | Attending: Radiation Oncology | Admitting: Radiation Oncology

## 2016-10-07 ENCOUNTER — Encounter: Payer: Self-pay | Admitting: Radiation Oncology

## 2016-10-07 ENCOUNTER — Telehealth: Payer: Self-pay | Admitting: Radiation Oncology

## 2016-10-07 VITALS — BP 115/73 | HR 54 | Temp 98.0°F | Resp 16 | Ht 67.5 in | Wt 217.0 lb

## 2016-10-07 DIAGNOSIS — Z91030 Bee allergy status: Secondary | ICD-10-CM | POA: Insufficient documentation

## 2016-10-07 DIAGNOSIS — I2510 Atherosclerotic heart disease of native coronary artery without angina pectoris: Secondary | ICD-10-CM | POA: Insufficient documentation

## 2016-10-07 DIAGNOSIS — C61 Malignant neoplasm of prostate: Secondary | ICD-10-CM | POA: Insufficient documentation

## 2016-10-07 DIAGNOSIS — I10 Essential (primary) hypertension: Secondary | ICD-10-CM | POA: Diagnosis not present

## 2016-10-07 DIAGNOSIS — I480 Paroxysmal atrial fibrillation: Secondary | ICD-10-CM | POA: Diagnosis not present

## 2016-10-07 DIAGNOSIS — Z87442 Personal history of urinary calculi: Secondary | ICD-10-CM | POA: Diagnosis not present

## 2016-10-07 DIAGNOSIS — Z79899 Other long term (current) drug therapy: Secondary | ICD-10-CM | POA: Insufficient documentation

## 2016-10-07 DIAGNOSIS — R972 Elevated prostate specific antigen [PSA]: Secondary | ICD-10-CM | POA: Diagnosis not present

## 2016-10-07 DIAGNOSIS — E785 Hyperlipidemia, unspecified: Secondary | ICD-10-CM | POA: Insufficient documentation

## 2016-10-07 DIAGNOSIS — N400 Enlarged prostate without lower urinary tract symptoms: Secondary | ICD-10-CM | POA: Diagnosis not present

## 2016-10-07 DIAGNOSIS — Z79891 Long term (current) use of opiate analgesic: Secondary | ICD-10-CM | POA: Insufficient documentation

## 2016-10-07 DIAGNOSIS — Z8249 Family history of ischemic heart disease and other diseases of the circulatory system: Secondary | ICD-10-CM | POA: Diagnosis not present

## 2016-10-07 DIAGNOSIS — Z888 Allergy status to other drugs, medicaments and biological substances status: Secondary | ICD-10-CM | POA: Diagnosis not present

## 2016-10-07 DIAGNOSIS — Z9889 Other specified postprocedural states: Secondary | ICD-10-CM | POA: Insufficient documentation

## 2016-10-07 DIAGNOSIS — K219 Gastro-esophageal reflux disease without esophagitis: Secondary | ICD-10-CM | POA: Diagnosis not present

## 2016-10-07 DIAGNOSIS — Z885 Allergy status to narcotic agent status: Secondary | ICD-10-CM | POA: Insufficient documentation

## 2016-10-07 DIAGNOSIS — Z803 Family history of malignant neoplasm of breast: Secondary | ICD-10-CM | POA: Diagnosis not present

## 2016-10-07 DIAGNOSIS — G4733 Obstructive sleep apnea (adult) (pediatric): Secondary | ICD-10-CM | POA: Insufficient documentation

## 2016-10-07 DIAGNOSIS — Z7982 Long term (current) use of aspirin: Secondary | ICD-10-CM | POA: Insufficient documentation

## 2016-10-07 DIAGNOSIS — Z806 Family history of leukemia: Secondary | ICD-10-CM | POA: Insufficient documentation

## 2016-10-07 DIAGNOSIS — Z8551 Personal history of malignant neoplasm of bladder: Secondary | ICD-10-CM | POA: Diagnosis not present

## 2016-10-07 NOTE — Telephone Encounter (Signed)
Opened in error

## 2016-10-07 NOTE — Progress Notes (Signed)
See progress note under physician encounter. 

## 2016-10-08 ENCOUNTER — Telehealth: Payer: Self-pay | Admitting: Medical Oncology

## 2016-10-08 NOTE — Telephone Encounter (Signed)
I called Charles Wright to introduce myself as the prostate nurse navigator and my role. He was seen in consult 10/07/16 by Dr. Tammi Klippel and I was unable to meet him. He states that the visit as expected and he has chosen ADT with radiation. I will continue to follow and asked him to call me with questions or concerns. He voiced understanding.

## 2016-10-12 DIAGNOSIS — T63451D Toxic effect of venom of hornets, accidental (unintentional), subsequent encounter: Secondary | ICD-10-CM | POA: Diagnosis not present

## 2016-10-12 DIAGNOSIS — T63461D Toxic effect of venom of wasps, accidental (unintentional), subsequent encounter: Secondary | ICD-10-CM | POA: Diagnosis not present

## 2016-10-15 ENCOUNTER — Telehealth: Payer: Self-pay | Admitting: *Deleted

## 2016-10-15 NOTE — Telephone Encounter (Signed)
Called patient to inform of ADT inj. And gold seed placement on 11-03-16- arrival time - 2:45 pm and his sim on 03-04-17 @ 1 pm @ Dr. Johny Shears Office, spoke with patient and he is aware of these appts.

## 2016-10-18 DIAGNOSIS — G4733 Obstructive sleep apnea (adult) (pediatric): Secondary | ICD-10-CM | POA: Diagnosis not present

## 2016-11-03 ENCOUNTER — Encounter: Payer: Self-pay | Admitting: Medical Oncology

## 2016-11-09 DIAGNOSIS — D225 Melanocytic nevi of trunk: Secondary | ICD-10-CM | POA: Diagnosis not present

## 2016-11-09 DIAGNOSIS — L820 Inflamed seborrheic keratosis: Secondary | ICD-10-CM | POA: Diagnosis not present

## 2016-11-09 DIAGNOSIS — D485 Neoplasm of uncertain behavior of skin: Secondary | ICD-10-CM | POA: Diagnosis not present

## 2016-11-09 DIAGNOSIS — L821 Other seborrheic keratosis: Secondary | ICD-10-CM | POA: Diagnosis not present

## 2016-11-09 DIAGNOSIS — C44319 Basal cell carcinoma of skin of other parts of face: Secondary | ICD-10-CM | POA: Diagnosis not present

## 2016-11-09 DIAGNOSIS — D1801 Hemangioma of skin and subcutaneous tissue: Secondary | ICD-10-CM | POA: Diagnosis not present

## 2016-11-09 DIAGNOSIS — Z85828 Personal history of other malignant neoplasm of skin: Secondary | ICD-10-CM | POA: Diagnosis not present

## 2016-11-10 DIAGNOSIS — T63461D Toxic effect of venom of wasps, accidental (unintentional), subsequent encounter: Secondary | ICD-10-CM | POA: Diagnosis not present

## 2016-11-10 DIAGNOSIS — T63451D Toxic effect of venom of hornets, accidental (unintentional), subsequent encounter: Secondary | ICD-10-CM | POA: Diagnosis not present

## 2016-11-12 ENCOUNTER — Telehealth: Payer: Self-pay | Admitting: Radiation Oncology

## 2016-11-12 DIAGNOSIS — C61 Malignant neoplasm of prostate: Secondary | ICD-10-CM | POA: Diagnosis not present

## 2016-11-12 NOTE — Telephone Encounter (Signed)
He'll need at least 6 weeks after his first ADT injection before simulation.  I don't see that date.  Once we establish that, we can move up simulation to 6 weeks after ADT start.  MM

## 2016-11-12 NOTE — Telephone Encounter (Signed)
Returned wife's call. She reports her husband had his gold seeds placed today and received his first ADT injection. She goes onto say he is scheduled for his second ADT injection on November. She reports that originally he wanted to delay treatment due to a work conference thus [facility] was scheduled for January 25th. Patient has decided he would like to move forward with treatment ASAP despite work trip. Explained this RN will inform Dr. Tammi Klippel of the change, inquire about time span needed from seed placement to sim, and have sim staff contact her Monday with a new appointment time. She verbalized understanding and expressed appreciation for the return call.

## 2016-11-15 DIAGNOSIS — C674 Malignant neoplasm of posterior wall of bladder: Secondary | ICD-10-CM | POA: Diagnosis not present

## 2016-12-10 DIAGNOSIS — C61 Malignant neoplasm of prostate: Secondary | ICD-10-CM | POA: Diagnosis not present

## 2016-12-10 DIAGNOSIS — Z5111 Encounter for antineoplastic chemotherapy: Secondary | ICD-10-CM | POA: Diagnosis not present

## 2016-12-14 DIAGNOSIS — T63461D Toxic effect of venom of wasps, accidental (unintentional), subsequent encounter: Secondary | ICD-10-CM | POA: Diagnosis not present

## 2016-12-14 DIAGNOSIS — T63451D Toxic effect of venom of hornets, accidental (unintentional), subsequent encounter: Secondary | ICD-10-CM | POA: Diagnosis not present

## 2017-01-11 DIAGNOSIS — G4733 Obstructive sleep apnea (adult) (pediatric): Secondary | ICD-10-CM | POA: Diagnosis not present

## 2017-01-12 DIAGNOSIS — T63461D Toxic effect of venom of wasps, accidental (unintentional), subsequent encounter: Secondary | ICD-10-CM | POA: Diagnosis not present

## 2017-01-12 DIAGNOSIS — T63451D Toxic effect of venom of hornets, accidental (unintentional), subsequent encounter: Secondary | ICD-10-CM | POA: Diagnosis not present

## 2017-01-26 ENCOUNTER — Ambulatory Visit
Admission: RE | Admit: 2017-01-26 | Discharge: 2017-01-26 | Disposition: A | Discharge status: Home / Self Care | Payer: PPO | Source: Ambulatory Visit | Attending: Radiation Oncology | Admitting: Radiation Oncology

## 2017-01-26 DIAGNOSIS — Z510 Encounter for antineoplastic radiation therapy: Secondary | ICD-10-CM | POA: Diagnosis not present

## 2017-01-26 DIAGNOSIS — C61 Malignant neoplasm of prostate: Secondary | ICD-10-CM | POA: Diagnosis not present

## 2017-01-26 NOTE — Progress Notes (Signed)
  Radiation Oncology         (336) [phone] ________________________________  Name: Charles Wright MRN: [id number]  Date: 01/26/2017  DOB: 08/07/1935  SIMULATION AND TREATMENT PLANNING NOTE    ICD-10-CM   1. Prostate cancer [facility]) C61     DIAGNOSIS:  81 year-old gentleman with Stage T2a adenocarcinoma of the prostate with Gleason Score of 3+4, and PSA of 3.52.  NARRATIVE:  The patient was brought to the [facility].  Identity was confirmed.  All relevant records and images related to the planned course of therapy were reviewed.  The patient freely provided informed written consent to proceed with treatment after reviewing the details related to the planned course of therapy. The consent form was witnessed and verified by the simulation staff.  Then, the patient was set-up in a stable reproducible supine position for radiation therapy.  A vacuum lock pillow device was custom fabricated to position his legs in a reproducible immobilized position.  Then, I fused his previous MRI to identify the prostatic apex.  CT images were obtained.  Surface markings were placed.  The CT images were loaded into the planning software.  Then the prostate target and avoidance structures including the rectum, bladder, bowel and hips were contoured.  Treatment planning then occurred.  The radiation prescription was entered and confirmed.  A total of one complex treatment devices was fabricated. I have requested : Intensity Modulated Radiotherapy (IMRT) is medically necessary for this case for the following reason:  Rectal sparing.Marland Kitchen  PLAN:  The patient will receive 78 Gy in 40 fractions.  ________________________________  Sheral Apley Tammi Klippel, M.D.

## 2017-02-07 DIAGNOSIS — Z510 Encounter for antineoplastic radiation therapy: Secondary | ICD-10-CM | POA: Diagnosis not present

## 2017-02-07 DIAGNOSIS — C61 Malignant neoplasm of prostate: Secondary | ICD-10-CM | POA: Diagnosis not present

## 2017-02-08 DIAGNOSIS — C61 Malignant neoplasm of prostate: Secondary | ICD-10-CM

## 2017-02-08 HISTORY — DX: Malignant neoplasm of prostate: C61

## 2017-02-09 ENCOUNTER — Ambulatory Visit: Payer: PPO

## 2017-02-09 DIAGNOSIS — T63461D Toxic effect of venom of wasps, accidental (unintentional), subsequent encounter: Secondary | ICD-10-CM | POA: Diagnosis not present

## 2017-02-09 DIAGNOSIS — C61 Malignant neoplasm of prostate: Secondary | ICD-10-CM | POA: Diagnosis not present

## 2017-02-09 DIAGNOSIS — T63451D Toxic effect of venom of hornets, accidental (unintentional), subsequent encounter: Secondary | ICD-10-CM | POA: Diagnosis not present

## 2017-02-09 DIAGNOSIS — Z510 Encounter for antineoplastic radiation therapy: Secondary | ICD-10-CM | POA: Diagnosis not present

## 2017-02-10 ENCOUNTER — Ambulatory Visit: Payer: PPO | Admitting: Neurology

## 2017-02-10 ENCOUNTER — Ambulatory Visit
Admission: RE | Admit: 2017-02-10 | Discharge: 2017-02-10 | Disposition: A | Discharge status: Home / Self Care | Payer: PPO | Source: Ambulatory Visit | Attending: Radiation Oncology | Admitting: Radiation Oncology

## 2017-02-10 DIAGNOSIS — Z510 Encounter for antineoplastic radiation therapy: Secondary | ICD-10-CM | POA: Diagnosis not present

## 2017-02-10 DIAGNOSIS — C61 Malignant neoplasm of prostate: Secondary | ICD-10-CM | POA: Diagnosis not present

## 2017-02-11 ENCOUNTER — Ambulatory Visit
Admission: RE | Admit: 2017-02-11 | Discharge: 2017-02-11 | Disposition: A | Discharge status: Home / Self Care | Payer: PPO | Source: Ambulatory Visit | Attending: Radiation Oncology | Admitting: Radiation Oncology

## 2017-02-11 DIAGNOSIS — C61 Malignant neoplasm of prostate: Secondary | ICD-10-CM | POA: Diagnosis not present

## 2017-02-11 DIAGNOSIS — Z510 Encounter for antineoplastic radiation therapy: Secondary | ICD-10-CM | POA: Diagnosis not present

## 2017-02-14 ENCOUNTER — Ambulatory Visit
Admission: RE | Admit: 2017-02-14 | Discharge: 2017-02-14 | Disposition: A | Discharge status: Home / Self Care | Payer: PPO | Source: Ambulatory Visit | Attending: Radiation Oncology | Admitting: Radiation Oncology

## 2017-02-14 DIAGNOSIS — C61 Malignant neoplasm of prostate: Secondary | ICD-10-CM | POA: Diagnosis not present

## 2017-02-14 DIAGNOSIS — Z510 Encounter for antineoplastic radiation therapy: Secondary | ICD-10-CM | POA: Diagnosis not present

## 2017-02-15 ENCOUNTER — Ambulatory Visit
Admission: RE | Admit: 2017-02-15 | Discharge: 2017-02-15 | Disposition: A | Discharge status: Home / Self Care | Payer: PPO | Source: Ambulatory Visit | Attending: Radiation Oncology | Admitting: Radiation Oncology

## 2017-02-15 DIAGNOSIS — C61 Malignant neoplasm of prostate: Secondary | ICD-10-CM | POA: Diagnosis not present

## 2017-02-15 DIAGNOSIS — H903 Sensorineural hearing loss, bilateral: Secondary | ICD-10-CM | POA: Diagnosis not present

## 2017-02-15 DIAGNOSIS — Z510 Encounter for antineoplastic radiation therapy: Secondary | ICD-10-CM | POA: Diagnosis not present

## 2017-02-16 ENCOUNTER — Ambulatory Visit
Admission: RE | Admit: 2017-02-16 | Discharge: 2017-02-16 | Disposition: A | Discharge status: Home / Self Care | Payer: PPO | Source: Ambulatory Visit | Attending: Radiation Oncology | Admitting: Radiation Oncology

## 2017-02-16 DIAGNOSIS — C61 Malignant neoplasm of prostate: Secondary | ICD-10-CM | POA: Diagnosis not present

## 2017-02-16 DIAGNOSIS — Z510 Encounter for antineoplastic radiation therapy: Secondary | ICD-10-CM | POA: Diagnosis not present

## 2017-02-17 ENCOUNTER — Ambulatory Visit
Admission: RE | Admit: 2017-02-17 | Discharge: 2017-02-17 | Disposition: A | Discharge status: Home / Self Care | Payer: PPO | Source: Ambulatory Visit | Attending: Radiation Oncology | Admitting: Radiation Oncology

## 2017-02-17 DIAGNOSIS — Z510 Encounter for antineoplastic radiation therapy: Secondary | ICD-10-CM | POA: Diagnosis not present

## 2017-02-17 DIAGNOSIS — C61 Malignant neoplasm of prostate: Secondary | ICD-10-CM | POA: Diagnosis not present

## 2017-02-18 ENCOUNTER — Ambulatory Visit
Admission: RE | Admit: 2017-02-18 | Discharge: 2017-02-18 | Disposition: A | Discharge status: Home / Self Care | Payer: PPO | Source: Ambulatory Visit | Attending: Radiation Oncology | Admitting: Radiation Oncology

## 2017-02-18 DIAGNOSIS — Z510 Encounter for antineoplastic radiation therapy: Secondary | ICD-10-CM | POA: Diagnosis not present

## 2017-02-18 DIAGNOSIS — C61 Malignant neoplasm of prostate: Secondary | ICD-10-CM | POA: Diagnosis not present

## 2017-02-21 ENCOUNTER — Ambulatory Visit
Admission: RE | Admit: 2017-02-21 | Discharge: 2017-02-21 | Disposition: A | Discharge status: Home / Self Care | Payer: PPO | Source: Ambulatory Visit | Attending: Radiation Oncology | Admitting: Radiation Oncology

## 2017-02-21 DIAGNOSIS — Z510 Encounter for antineoplastic radiation therapy: Secondary | ICD-10-CM | POA: Diagnosis not present

## 2017-02-21 DIAGNOSIS — C61 Malignant neoplasm of prostate: Secondary | ICD-10-CM | POA: Diagnosis not present

## 2017-02-22 ENCOUNTER — Ambulatory Visit
Admission: RE | Admit: 2017-02-22 | Discharge: 2017-02-22 | Disposition: A | Discharge status: Home / Self Care | Payer: PPO | Source: Ambulatory Visit | Attending: Radiation Oncology | Admitting: Radiation Oncology

## 2017-02-22 DIAGNOSIS — C61 Malignant neoplasm of prostate: Secondary | ICD-10-CM | POA: Diagnosis not present

## 2017-02-22 DIAGNOSIS — Z510 Encounter for antineoplastic radiation therapy: Secondary | ICD-10-CM | POA: Diagnosis not present

## 2017-02-23 ENCOUNTER — Ambulatory Visit
Admission: RE | Admit: 2017-02-23 | Discharge: 2017-02-23 | Disposition: A | Discharge status: Home / Self Care | Payer: PPO | Source: Ambulatory Visit | Attending: Radiation Oncology | Admitting: Radiation Oncology

## 2017-02-23 DIAGNOSIS — C61 Malignant neoplasm of prostate: Secondary | ICD-10-CM | POA: Diagnosis not present

## 2017-02-23 DIAGNOSIS — Z510 Encounter for antineoplastic radiation therapy: Secondary | ICD-10-CM | POA: Diagnosis not present

## 2017-02-24 ENCOUNTER — Ambulatory Visit
Admission: RE | Admit: 2017-02-24 | Discharge: 2017-02-24 | Disposition: A | Discharge status: Home / Self Care | Payer: PPO | Source: Ambulatory Visit | Attending: Radiation Oncology | Admitting: Radiation Oncology

## 2017-02-24 DIAGNOSIS — C61 Malignant neoplasm of prostate: Secondary | ICD-10-CM | POA: Diagnosis not present

## 2017-02-24 DIAGNOSIS — Z510 Encounter for antineoplastic radiation therapy: Secondary | ICD-10-CM | POA: Diagnosis not present

## 2017-02-25 ENCOUNTER — Ambulatory Visit
Admission: RE | Admit: 2017-02-25 | Discharge: 2017-02-25 | Disposition: A | Discharge status: Home / Self Care | Payer: PPO | Source: Ambulatory Visit | Attending: Radiation Oncology | Admitting: Radiation Oncology

## 2017-02-25 DIAGNOSIS — H903 Sensorineural hearing loss, bilateral: Secondary | ICD-10-CM | POA: Diagnosis not present

## 2017-02-25 DIAGNOSIS — Z510 Encounter for antineoplastic radiation therapy: Secondary | ICD-10-CM | POA: Diagnosis not present

## 2017-02-25 DIAGNOSIS — C61 Malignant neoplasm of prostate: Secondary | ICD-10-CM | POA: Diagnosis not present

## 2017-02-28 ENCOUNTER — Ambulatory Visit
Admission: RE | Admit: 2017-02-28 | Discharge: 2017-02-28 | Disposition: A | Discharge status: Home / Self Care | Payer: PPO | Source: Ambulatory Visit | Attending: Radiation Oncology | Admitting: Radiation Oncology

## 2017-02-28 DIAGNOSIS — Z510 Encounter for antineoplastic radiation therapy: Secondary | ICD-10-CM | POA: Diagnosis not present

## 2017-02-28 DIAGNOSIS — C61 Malignant neoplasm of prostate: Secondary | ICD-10-CM | POA: Diagnosis not present

## 2017-03-01 ENCOUNTER — Ambulatory Visit
Admission: RE | Admit: 2017-03-01 | Discharge: 2017-03-01 | Disposition: A | Discharge status: Home / Self Care | Payer: PPO | Source: Ambulatory Visit | Attending: Radiation Oncology | Admitting: Radiation Oncology

## 2017-03-01 DIAGNOSIS — C61 Malignant neoplasm of prostate: Secondary | ICD-10-CM | POA: Diagnosis not present

## 2017-03-01 DIAGNOSIS — Z510 Encounter for antineoplastic radiation therapy: Secondary | ICD-10-CM | POA: Diagnosis not present

## 2017-03-02 ENCOUNTER — Ambulatory Visit
Admission: RE | Admit: 2017-03-02 | Discharge: 2017-03-02 | Disposition: A | Discharge status: Home / Self Care | Payer: PPO | Source: Ambulatory Visit | Attending: Radiation Oncology | Admitting: Radiation Oncology

## 2017-03-02 DIAGNOSIS — Z510 Encounter for antineoplastic radiation therapy: Secondary | ICD-10-CM | POA: Diagnosis not present

## 2017-03-02 DIAGNOSIS — C61 Malignant neoplasm of prostate: Secondary | ICD-10-CM | POA: Diagnosis not present

## 2017-03-03 ENCOUNTER — Ambulatory Visit
Admission: RE | Admit: 2017-03-03 | Discharge: 2017-03-03 | Disposition: A | Discharge status: Home / Self Care | Payer: PPO | Source: Ambulatory Visit | Attending: Radiation Oncology | Admitting: Radiation Oncology

## 2017-03-03 DIAGNOSIS — C61 Malignant neoplasm of prostate: Secondary | ICD-10-CM | POA: Diagnosis not present

## 2017-03-03 DIAGNOSIS — Z510 Encounter for antineoplastic radiation therapy: Secondary | ICD-10-CM | POA: Diagnosis not present

## 2017-03-04 ENCOUNTER — Ambulatory Visit: Payer: PPO | Admitting: Radiation Oncology

## 2017-03-04 ENCOUNTER — Ambulatory Visit
Admission: RE | Admit: 2017-03-04 | Discharge: 2017-03-04 | Disposition: A | Discharge status: Home / Self Care | Payer: PPO | Source: Ambulatory Visit | Attending: Radiation Oncology | Admitting: Radiation Oncology

## 2017-03-04 ENCOUNTER — Telehealth: Payer: Self-pay | Admitting: Radiation Oncology

## 2017-03-04 DIAGNOSIS — Z510 Encounter for antineoplastic radiation therapy: Secondary | ICD-10-CM | POA: Diagnosis not present

## 2017-03-04 DIAGNOSIS — C61 Malignant neoplasm of prostate: Secondary | ICD-10-CM | POA: Diagnosis not present

## 2017-03-04 MED ORDER — TAMSULOSIN HCL 0.4 MG PO CAPS: 0.4000 mg | ORAL_CAPSULE | Freq: Every day | ORAL | 5 refills | Status: DC

## 2017-03-04 NOTE — Telephone Encounter (Signed)
Left pharmacy voicemail at [facility] on [facility] for flomax as ordered by Dr. Tammi Klippel.

## 2017-03-07 ENCOUNTER — Ambulatory Visit
Admission: RE | Admit: 2017-03-07 | Discharge: 2017-03-07 | Disposition: A | Discharge status: Home / Self Care | Payer: PPO | Source: Ambulatory Visit | Attending: Radiation Oncology | Admitting: Radiation Oncology

## 2017-03-07 DIAGNOSIS — Z510 Encounter for antineoplastic radiation therapy: Secondary | ICD-10-CM | POA: Diagnosis not present

## 2017-03-07 DIAGNOSIS — C61 Malignant neoplasm of prostate: Secondary | ICD-10-CM | POA: Diagnosis not present

## 2017-03-08 ENCOUNTER — Ambulatory Visit
Admission: RE | Admit: 2017-03-08 | Discharge: 2017-03-08 | Disposition: A | Discharge status: Home / Self Care | Payer: PPO | Source: Ambulatory Visit | Attending: Radiation Oncology | Admitting: Radiation Oncology

## 2017-03-08 DIAGNOSIS — C61 Malignant neoplasm of prostate: Secondary | ICD-10-CM | POA: Diagnosis not present

## 2017-03-08 DIAGNOSIS — Z510 Encounter for antineoplastic radiation therapy: Secondary | ICD-10-CM | POA: Diagnosis not present

## 2017-03-09 ENCOUNTER — Ambulatory Visit
Admission: RE | Admit: 2017-03-09 | Discharge: 2017-03-09 | Disposition: A | Discharge status: Home / Self Care | Payer: PPO | Source: Ambulatory Visit | Attending: Radiation Oncology | Admitting: Radiation Oncology

## 2017-03-09 DIAGNOSIS — C61 Malignant neoplasm of prostate: Secondary | ICD-10-CM | POA: Diagnosis not present

## 2017-03-09 DIAGNOSIS — Z510 Encounter for antineoplastic radiation therapy: Secondary | ICD-10-CM | POA: Diagnosis not present

## 2017-03-10 ENCOUNTER — Ambulatory Visit
Admission: RE | Admit: 2017-03-10 | Discharge: 2017-03-10 | Disposition: A | Discharge status: Home / Self Care | Payer: PPO | Source: Ambulatory Visit | Attending: Radiation Oncology | Admitting: Radiation Oncology

## 2017-03-10 DIAGNOSIS — C61 Malignant neoplasm of prostate: Secondary | ICD-10-CM | POA: Diagnosis not present

## 2017-03-10 DIAGNOSIS — Z510 Encounter for antineoplastic radiation therapy: Secondary | ICD-10-CM | POA: Diagnosis not present

## 2017-03-11 ENCOUNTER — Ambulatory Visit
Admission: RE | Admit: 2017-03-11 | Discharge: 2017-03-11 | Disposition: A | Discharge status: Home / Self Care | Payer: PPO | Source: Ambulatory Visit | Attending: Radiation Oncology | Admitting: Radiation Oncology

## 2017-03-11 DIAGNOSIS — C61 Malignant neoplasm of prostate: Secondary | ICD-10-CM | POA: Insufficient documentation

## 2017-03-11 DIAGNOSIS — Z510 Encounter for antineoplastic radiation therapy: Secondary | ICD-10-CM | POA: Diagnosis not present

## 2017-03-14 ENCOUNTER — Ambulatory Visit
Admission: RE | Admit: 2017-03-14 | Discharge: 2017-03-14 | Disposition: A | Discharge status: Home / Self Care | Payer: PPO | Source: Ambulatory Visit | Attending: Radiation Oncology | Admitting: Radiation Oncology

## 2017-03-14 DIAGNOSIS — C61 Malignant neoplasm of prostate: Secondary | ICD-10-CM | POA: Diagnosis not present

## 2017-03-14 DIAGNOSIS — Z510 Encounter for antineoplastic radiation therapy: Secondary | ICD-10-CM | POA: Diagnosis not present

## 2017-03-14 DIAGNOSIS — T63451D Toxic effect of venom of hornets, accidental (unintentional), subsequent encounter: Secondary | ICD-10-CM | POA: Diagnosis not present

## 2017-03-14 DIAGNOSIS — T63461D Toxic effect of venom of wasps, accidental (unintentional), subsequent encounter: Secondary | ICD-10-CM | POA: Diagnosis not present

## 2017-03-15 ENCOUNTER — Ambulatory Visit
Admission: RE | Admit: 2017-03-15 | Discharge: 2017-03-15 | Disposition: A | Discharge status: Home / Self Care | Payer: PPO | Source: Ambulatory Visit | Attending: Radiation Oncology | Admitting: Radiation Oncology

## 2017-03-15 DIAGNOSIS — C61 Malignant neoplasm of prostate: Secondary | ICD-10-CM | POA: Diagnosis not present

## 2017-03-15 DIAGNOSIS — Z510 Encounter for antineoplastic radiation therapy: Secondary | ICD-10-CM | POA: Diagnosis not present

## 2017-03-16 ENCOUNTER — Ambulatory Visit
Admission: RE | Admit: 2017-03-16 | Discharge: 2017-03-16 | Disposition: A | Discharge status: Home / Self Care | Payer: PPO | Source: Ambulatory Visit | Attending: Radiation Oncology | Admitting: Radiation Oncology

## 2017-03-16 DIAGNOSIS — Z510 Encounter for antineoplastic radiation therapy: Secondary | ICD-10-CM | POA: Diagnosis not present

## 2017-03-16 DIAGNOSIS — C61 Malignant neoplasm of prostate: Secondary | ICD-10-CM | POA: Diagnosis not present

## 2017-03-17 ENCOUNTER — Ambulatory Visit
Admission: RE | Admit: 2017-03-17 | Discharge: 2017-03-17 | Disposition: A | Discharge status: Home / Self Care | Payer: PPO | Source: Ambulatory Visit | Attending: Radiation Oncology | Admitting: Radiation Oncology

## 2017-03-17 DIAGNOSIS — Z510 Encounter for antineoplastic radiation therapy: Secondary | ICD-10-CM | POA: Diagnosis not present

## 2017-03-17 DIAGNOSIS — C61 Malignant neoplasm of prostate: Secondary | ICD-10-CM | POA: Diagnosis not present

## 2017-03-18 ENCOUNTER — Ambulatory Visit
Admission: RE | Admit: 2017-03-18 | Discharge: 2017-03-18 | Disposition: A | Discharge status: Home / Self Care | Payer: PPO | Source: Ambulatory Visit | Attending: Radiation Oncology | Admitting: Radiation Oncology

## 2017-03-18 DIAGNOSIS — Z510 Encounter for antineoplastic radiation therapy: Secondary | ICD-10-CM | POA: Diagnosis not present

## 2017-03-18 DIAGNOSIS — C61 Malignant neoplasm of prostate: Secondary | ICD-10-CM | POA: Diagnosis not present

## 2017-03-21 ENCOUNTER — Ambulatory Visit
Admission: RE | Admit: 2017-03-21 | Discharge: 2017-03-21 | Disposition: A | Discharge status: Home / Self Care | Payer: PPO | Source: Ambulatory Visit | Attending: Radiation Oncology | Admitting: Radiation Oncology

## 2017-03-21 DIAGNOSIS — Z510 Encounter for antineoplastic radiation therapy: Secondary | ICD-10-CM | POA: Diagnosis not present

## 2017-03-21 DIAGNOSIS — C61 Malignant neoplasm of prostate: Secondary | ICD-10-CM | POA: Diagnosis not present

## 2017-03-22 ENCOUNTER — Encounter: Payer: Self-pay | Admitting: Medical Oncology

## 2017-03-22 ENCOUNTER — Ambulatory Visit
Admission: RE | Admit: 2017-03-22 | Discharge: 2017-03-22 | Disposition: A | Discharge status: Home / Self Care | Payer: PPO | Source: Ambulatory Visit | Attending: Radiation Oncology | Admitting: Radiation Oncology

## 2017-03-22 DIAGNOSIS — E559 Vitamin D deficiency, unspecified: Secondary | ICD-10-CM | POA: Diagnosis not present

## 2017-03-22 DIAGNOSIS — Z510 Encounter for antineoplastic radiation therapy: Secondary | ICD-10-CM | POA: Diagnosis not present

## 2017-03-22 DIAGNOSIS — M109 Gout, unspecified: Secondary | ICD-10-CM | POA: Diagnosis not present

## 2017-03-22 DIAGNOSIS — I10 Essential (primary) hypertension: Secondary | ICD-10-CM | POA: Diagnosis not present

## 2017-03-22 DIAGNOSIS — C61 Malignant neoplasm of prostate: Secondary | ICD-10-CM | POA: Diagnosis not present

## 2017-03-22 NOTE — Progress Notes (Signed)
Mr. Schnoor states he is doing well with radiation except for some minor urinary symptoms and fatigue. He has been taking Flomax which has helped his urinary symptoms. We discussed taking extra rest periods during they and how the side effects will begin to improve once his has completed treatments. He states he has 10 treatments after today.

## 2017-03-23 ENCOUNTER — Ambulatory Visit
Admission: RE | Admit: 2017-03-23 | Discharge: 2017-03-23 | Disposition: A | Discharge status: Home / Self Care | Payer: PPO | Source: Ambulatory Visit | Attending: Radiation Oncology | Admitting: Radiation Oncology

## 2017-03-23 DIAGNOSIS — I480 Paroxysmal atrial fibrillation: Secondary | ICD-10-CM | POA: Diagnosis not present

## 2017-03-23 DIAGNOSIS — Z0000 Encounter for general adult medical examination without abnormal findings: Secondary | ICD-10-CM | POA: Diagnosis not present

## 2017-03-23 DIAGNOSIS — I10 Essential (primary) hypertension: Secondary | ICD-10-CM | POA: Diagnosis not present

## 2017-03-23 DIAGNOSIS — M109 Gout, unspecified: Secondary | ICD-10-CM | POA: Diagnosis not present

## 2017-03-23 DIAGNOSIS — E039 Hypothyroidism, unspecified: Secondary | ICD-10-CM | POA: Diagnosis not present

## 2017-03-23 DIAGNOSIS — I2510 Atherosclerotic heart disease of native coronary artery without angina pectoris: Secondary | ICD-10-CM | POA: Diagnosis not present

## 2017-03-23 DIAGNOSIS — Z1389 Encounter for screening for other disorder: Secondary | ICD-10-CM | POA: Diagnosis not present

## 2017-03-23 DIAGNOSIS — E7800 Pure hypercholesterolemia, unspecified: Secondary | ICD-10-CM | POA: Diagnosis not present

## 2017-03-23 DIAGNOSIS — G4733 Obstructive sleep apnea (adult) (pediatric): Secondary | ICD-10-CM | POA: Diagnosis not present

## 2017-03-23 DIAGNOSIS — C61 Malignant neoplasm of prostate: Secondary | ICD-10-CM | POA: Diagnosis not present

## 2017-03-23 DIAGNOSIS — E559 Vitamin D deficiency, unspecified: Secondary | ICD-10-CM | POA: Diagnosis not present

## 2017-03-23 DIAGNOSIS — Z510 Encounter for antineoplastic radiation therapy: Secondary | ICD-10-CM | POA: Diagnosis not present

## 2017-03-23 DIAGNOSIS — I129 Hypertensive chronic kidney disease with stage 1 through stage 4 chronic kidney disease, or unspecified chronic kidney disease: Secondary | ICD-10-CM | POA: Diagnosis not present

## 2017-03-24 ENCOUNTER — Ambulatory Visit
Admission: RE | Admit: 2017-03-24 | Discharge: 2017-03-24 | Disposition: A | Discharge status: Home / Self Care | Payer: PPO | Source: Ambulatory Visit | Attending: Radiation Oncology | Admitting: Radiation Oncology

## 2017-03-24 DIAGNOSIS — Z510 Encounter for antineoplastic radiation therapy: Secondary | ICD-10-CM | POA: Diagnosis not present

## 2017-03-24 DIAGNOSIS — C61 Malignant neoplasm of prostate: Secondary | ICD-10-CM | POA: Diagnosis not present

## 2017-03-25 ENCOUNTER — Ambulatory Visit
Admission: RE | Admit: 2017-03-25 | Discharge: 2017-03-25 | Disposition: A | Discharge status: Home / Self Care | Payer: PPO | Source: Ambulatory Visit | Attending: Radiation Oncology | Admitting: Radiation Oncology

## 2017-03-25 DIAGNOSIS — C61 Malignant neoplasm of prostate: Secondary | ICD-10-CM | POA: Diagnosis not present

## 2017-03-25 DIAGNOSIS — Z510 Encounter for antineoplastic radiation therapy: Secondary | ICD-10-CM | POA: Diagnosis not present

## 2017-03-28 ENCOUNTER — Ambulatory Visit
Admission: RE | Admit: 2017-03-28 | Discharge: 2017-03-28 | Disposition: A | Discharge status: Home / Self Care | Payer: PPO | Source: Ambulatory Visit | Attending: Radiation Oncology | Admitting: Radiation Oncology

## 2017-03-28 DIAGNOSIS — Z510 Encounter for antineoplastic radiation therapy: Secondary | ICD-10-CM | POA: Diagnosis not present

## 2017-03-28 DIAGNOSIS — C61 Malignant neoplasm of prostate: Secondary | ICD-10-CM | POA: Diagnosis not present

## 2017-03-29 ENCOUNTER — Ambulatory Visit
Admission: RE | Admit: 2017-03-29 | Discharge: 2017-03-29 | Disposition: A | Discharge status: Home / Self Care | Payer: PPO | Source: Ambulatory Visit | Attending: Radiation Oncology | Admitting: Radiation Oncology

## 2017-03-29 DIAGNOSIS — C61 Malignant neoplasm of prostate: Secondary | ICD-10-CM | POA: Diagnosis not present

## 2017-03-29 DIAGNOSIS — Z510 Encounter for antineoplastic radiation therapy: Secondary | ICD-10-CM | POA: Diagnosis not present

## 2017-03-30 ENCOUNTER — Ambulatory Visit
Admission: RE | Admit: 2017-03-30 | Discharge: 2017-03-30 | Disposition: A | Discharge status: Home / Self Care | Payer: PPO | Source: Ambulatory Visit | Attending: Radiation Oncology | Admitting: Radiation Oncology

## 2017-03-30 DIAGNOSIS — Z510 Encounter for antineoplastic radiation therapy: Secondary | ICD-10-CM | POA: Diagnosis not present

## 2017-03-30 DIAGNOSIS — C61 Malignant neoplasm of prostate: Secondary | ICD-10-CM | POA: Diagnosis not present

## 2017-03-31 ENCOUNTER — Ambulatory Visit
Admission: RE | Admit: 2017-03-31 | Discharge: 2017-03-31 | Disposition: A | Discharge status: Home / Self Care | Payer: PPO | Source: Ambulatory Visit | Attending: Radiation Oncology | Admitting: Radiation Oncology

## 2017-03-31 DIAGNOSIS — C61 Malignant neoplasm of prostate: Secondary | ICD-10-CM | POA: Diagnosis not present

## 2017-03-31 DIAGNOSIS — Z510 Encounter for antineoplastic radiation therapy: Secondary | ICD-10-CM | POA: Diagnosis not present

## 2017-04-01 ENCOUNTER — Ambulatory Visit
Admission: RE | Admit: 2017-04-01 | Discharge: 2017-04-01 | Disposition: A | Discharge status: Home / Self Care | Payer: PPO | Source: Ambulatory Visit | Attending: Radiation Oncology | Admitting: Radiation Oncology

## 2017-04-01 DIAGNOSIS — Z510 Encounter for antineoplastic radiation therapy: Secondary | ICD-10-CM | POA: Diagnosis not present

## 2017-04-01 DIAGNOSIS — C61 Malignant neoplasm of prostate: Secondary | ICD-10-CM | POA: Diagnosis not present

## 2017-04-04 ENCOUNTER — Ambulatory Visit
Admission: RE | Admit: 2017-04-04 | Discharge: 2017-04-04 | Disposition: A | Discharge status: Home / Self Care | Payer: PPO | Source: Ambulatory Visit | Attending: Radiation Oncology | Admitting: Radiation Oncology

## 2017-04-04 DIAGNOSIS — Z510 Encounter for antineoplastic radiation therapy: Secondary | ICD-10-CM | POA: Diagnosis not present

## 2017-04-04 DIAGNOSIS — C61 Malignant neoplasm of prostate: Secondary | ICD-10-CM | POA: Diagnosis not present

## 2017-04-05 ENCOUNTER — Encounter: Payer: Self-pay | Admitting: Radiation Oncology

## 2017-04-05 ENCOUNTER — Ambulatory Visit
Admission: RE | Admit: 2017-04-05 | Discharge: 2017-04-05 | Disposition: A | Discharge status: Home / Self Care | Payer: PPO | Source: Ambulatory Visit | Attending: Radiation Oncology | Admitting: Radiation Oncology

## 2017-04-05 DIAGNOSIS — Z510 Encounter for antineoplastic radiation therapy: Secondary | ICD-10-CM | POA: Diagnosis not present

## 2017-04-05 DIAGNOSIS — C61 Malignant neoplasm of prostate: Secondary | ICD-10-CM | POA: Diagnosis not present

## 2017-04-06 DIAGNOSIS — H0012 Chalazion right lower eyelid: Secondary | ICD-10-CM | POA: Diagnosis not present

## 2017-04-06 DIAGNOSIS — H0100B Unspecified blepharitis left eye, upper and lower eyelids: Secondary | ICD-10-CM | POA: Diagnosis not present

## 2017-04-06 DIAGNOSIS — H0100A Unspecified blepharitis right eye, upper and lower eyelids: Secondary | ICD-10-CM | POA: Diagnosis not present

## 2017-04-06 DIAGNOSIS — L03213 Periorbital cellulitis: Secondary | ICD-10-CM | POA: Diagnosis not present

## 2017-04-06 NOTE — Progress Notes (Signed)
  Radiation Oncology         (336) [phone] ________________________________  Name: Charles Wright MRN: [id number]  Date: 04/05/2017  DOB: [birth year]  End of Treatment Note  Diagnosis:   82 y.o. gentleman with Stage T2a adenocarcinoma of the prostate with Gleason Score of 3+4, and PSA of 3.52    Indication for treatment:  Curative, Definitive Radiotherapy       Radiation treatment dates:   02/09/2017 - 04/05/2017  Site/dose:   The prostate was treated to 78 Gy in 40 fractions of 1.95 Gy  Beams/energy:   The patient was treated with IMRT using volumetric arc therapy delivering 6 MV X-rays to clockwise and counterclockwise circumferential arcs with a 90 degree collimator offset to avoid dose scalloping.  Image guidance was performed with daily cone beam CT prior to each fraction to align to gold markers in the prostate and assure proper bladder and rectal fill volumes.  Immobilization was achieved with Body[employer] custom mold.  Narrative: The patient tolerated radiation treatment relatively well.   The patient experienced modest fatigue and some minor urinary irritation symptoms of nocturia x3-4 and weak stream, managed with Flomax (use approved by Dr. Wynonia Lawman- cards). He denied any dysuria or hematuria. He denied any bowel issues.   Plan: The patient has completed radiation treatment. He will return to radiation oncology clinic for routine followup in one month. I advised him to call or return sooner if he has any questions or concerns related to his recovery or treatment. ________________________________  Sheral Apley. Tammi Klippel, M.D.  This document serves as a record of services personally performed by Tyler Pita, MD. It was created on his behalf by Rae Lips, a trained medical scribe. The creation of this record is based on the scribe's personal observations and the provider's statements to them. This document has been checked and approved by the attending provider.

## 2017-04-11 DIAGNOSIS — T63451D Toxic effect of venom of hornets, accidental (unintentional), subsequent encounter: Secondary | ICD-10-CM | POA: Diagnosis not present

## 2017-04-11 DIAGNOSIS — I119 Hypertensive heart disease without heart failure: Secondary | ICD-10-CM | POA: Diagnosis not present

## 2017-04-11 DIAGNOSIS — E7849 Other hyperlipidemia: Secondary | ICD-10-CM | POA: Diagnosis not present

## 2017-04-11 DIAGNOSIS — I2510 Atherosclerotic heart disease of native coronary artery without angina pectoris: Secondary | ICD-10-CM | POA: Diagnosis not present

## 2017-04-11 DIAGNOSIS — L03213 Periorbital cellulitis: Secondary | ICD-10-CM | POA: Diagnosis not present

## 2017-04-11 DIAGNOSIS — T63461D Toxic effect of venom of wasps, accidental (unintentional), subsequent encounter: Secondary | ICD-10-CM | POA: Diagnosis not present

## 2017-04-11 DIAGNOSIS — Z8673 Personal history of transient ischemic attack (TIA), and cerebral infarction without residual deficits: Secondary | ICD-10-CM | POA: Diagnosis not present

## 2017-04-11 DIAGNOSIS — I480 Paroxysmal atrial fibrillation: Secondary | ICD-10-CM | POA: Diagnosis not present

## 2017-04-11 DIAGNOSIS — H0011 Chalazion right upper eyelid: Secondary | ICD-10-CM | POA: Diagnosis not present

## 2017-04-11 DIAGNOSIS — H0015 Chalazion left lower eyelid: Secondary | ICD-10-CM | POA: Diagnosis not present

## 2017-04-11 DIAGNOSIS — G4733 Obstructive sleep apnea (adult) (pediatric): Secondary | ICD-10-CM | POA: Diagnosis not present

## 2017-04-11 DIAGNOSIS — I10 Essential (primary) hypertension: Secondary | ICD-10-CM | POA: Diagnosis not present

## 2017-04-11 DIAGNOSIS — E668 Other obesity: Secondary | ICD-10-CM | POA: Diagnosis not present

## 2017-04-11 DIAGNOSIS — H0012 Chalazion right lower eyelid: Secondary | ICD-10-CM | POA: Diagnosis not present

## 2017-04-15 DIAGNOSIS — L718 Other rosacea: Secondary | ICD-10-CM | POA: Diagnosis not present

## 2017-04-15 DIAGNOSIS — D225 Melanocytic nevi of trunk: Secondary | ICD-10-CM | POA: Diagnosis not present

## 2017-04-15 DIAGNOSIS — L821 Other seborrheic keratosis: Secondary | ICD-10-CM | POA: Diagnosis not present

## 2017-05-04 ENCOUNTER — Encounter: Payer: Self-pay | Admitting: Urology

## 2017-05-04 ENCOUNTER — Ambulatory Visit
Admission: RE | Admit: 2017-05-04 | Discharge: 2017-05-04 | Disposition: A | Discharge status: Home / Self Care | Payer: PPO | Source: Ambulatory Visit | Attending: Urology | Admitting: Urology

## 2017-05-04 ENCOUNTER — Other Ambulatory Visit: Payer: Self-pay

## 2017-05-04 VITALS — BP 134/73 | HR 58 | Temp 97.7°F | Ht 67.5 in | Wt 222.0 lb

## 2017-05-04 DIAGNOSIS — C61 Malignant neoplasm of prostate: Secondary | ICD-10-CM | POA: Insufficient documentation

## 2017-05-04 DIAGNOSIS — Z7982 Long term (current) use of aspirin: Secondary | ICD-10-CM | POA: Diagnosis not present

## 2017-05-04 DIAGNOSIS — Z923 Personal history of irradiation: Secondary | ICD-10-CM | POA: Diagnosis not present

## 2017-05-04 DIAGNOSIS — Z885 Allergy status to narcotic agent status: Secondary | ICD-10-CM | POA: Insufficient documentation

## 2017-05-04 DIAGNOSIS — Z79899 Other long term (current) drug therapy: Secondary | ICD-10-CM | POA: Insufficient documentation

## 2017-05-04 NOTE — Progress Notes (Signed)
Radiation Oncology         [phone] ________________________________  Name: Charles Wright MRN: [id number]  Date: 05/04/2017  DOB: 1935/09/16  Post Treatment Note  CC: Maury Dus, MD  [name], MD  Diagnosis:   82 y.o. gentleman with Stage T2a adenocarcinoma of the prostate with Gleason Score of 3+4, and PSA of 3.52.  Interval Since Last Radiation:  4 weeks  02/09/2017 - 04/05/2017:  The prostate was treated to 78 Gy in 40 fractions of 1.95 Gy  Narrative:  The patient returns today for routine follow-up.  He tolerated radiation treatment relatively well with modest fatigue and some minor urinary irritation symptoms of nocturia x3-4 and weak stream, managed with Flomax (use approved by Dr. Wynonia Lawman- cards). He denied any dysuria or hematuria. He denied any bowel issues.   On review of systems, the patient states that he continues with increased frequency, weak stream and nocturia.  The hot flashes (on ADT) and nocturia are his most bothersome symptoms as he is getting up hourly at night, sometimes to void and sometimes due to severe hot flashes.  He reports that his stream was improved while taking Flomax but he stopped taking this 1 week ago.  He denies dysuria, gross hematuria, straining to void, incomplete emptying or incontinence.  Overall, he feels his symptoms are gradually improving and is pleased with his progress to date.  His current IPSS is 10 indicating moderate LUTS.  He denies abdominal pain, N/V or diarrhea.  He reports a healthy appetite and is maintaining his weight.  He continues with fatigue which he associates with the ADT.  ALLERGIES:  is allergic to bee venom; losartan; and oxycodone.  Meds: Current Outpatient Medications  Medication Sig Dispense Refill  . allopurinol (ZYLOPRIM) 300 MG tablet Take 300 mg by mouth every morning.     Marland Kitchen amiodarone (PACERONE) 200 MG tablet Take 200 mg by mouth every morning.   0  . aspirin EC 81 MG EC tablet Take 1 tablet (81 mg  total) by mouth daily.    . Cholecalciferol (VITAMIN D-3) 1000 UNITS CAPS Take 1,000 Units by mouth 2 (two) times daily.     . clopidogrel (PLAVIX) 75 MG tablet Take 75 mg by mouth every morning.    . Colchicine 0.6 MG CAPS Take by mouth. As needed for gout    . diazepam (VALIUM) 10 MG tablet     . Glucosamine Sulfate (DONA PO) Take 1 tablet by mouth 2 (two) times daily.     . isosorbide mononitrate (IMDUR) 30 MG 24 hr tablet Take 1 tablet (30 mg total) by mouth daily. (Patient taking differently: Take 30 mg by mouth every evening. ) 30 tablet 12  . levothyroxine (SYNTHROID, LEVOTHROID) 50 MCG tablet Take 50 mcg by mouth daily before breakfast.    . metoprolol (LOPRESSOR) 50 MG tablet Take 50 mg by mouth 2 (two) times daily.    . Multiple Vitamin (MULTIVITAMIN) tablet Take 1 tablet by mouth 2 (two) times daily.     . pravastatin (PRAVACHOL) 40 MG tablet Take 1 tablet (40 mg total) by mouth daily. (Patient taking differently: Take 40 mg by mouth every morning. ) 30 tablet   . EPINEPHrine 0.3 mg/0.3 mL IJ SOAJ injection Inject 0.3 mLs (0.3 mg total) into the muscle once. (Patient not taking: Reported on 10/07/2016) 2 Device 1  . HYDROcodone-acetaminophen (NORCO/VICODIN) 5-325 MG tablet Take 1 tablet by mouth every 4 (four) hours as needed. (Patient not taking: Reported on  10/07/2016) 10 tablet 0  . nitroGLYCERIN (NITROSTAT) 0.4 MG SL tablet Place 1 tablet (0.4 mg total) under the tongue every 5 (five) minutes x 3 doses as needed for chest pain. (Patient not taking: Reported on 10/07/2016) 25 tablet 12  . tamsulosin (FLOMAX) 0.4 MG CAPS capsule Take 1 capsule (0.4 mg total) by mouth daily after supper. (Patient not taking: Reported on 05/04/2017) 30 capsule 5   No current facility-administered medications for this encounter.     Physical Findings:  height is 5' 7.5" (1.715 m) and weight is 222 lb (100.7 kg). His oral temperature is 97.7 F (36.5 C). His blood pressure is 134/73 and his pulse is 58  (abnormal). His oxygen saturation is 98%.  Pain Assessment Pain Score: 0-No pain/10 In general this is a well appearing Caucasian male in no acute distress. He's alert and oriented x4 and appropriate throughout the examination. Cardiopulmonary assessment is negative for acute distress and he exhibits normal effort.   Lab Findings: Lab Results  Component Value Date   WBC 12.0 (H) 06/14/2015   HGB 15.5 06/14/2015   HCT 44.9 06/14/2015   MCV 90.2 06/14/2015   PLT 204 06/14/2015     Radiographic Findings: No results found.  Impression/Plan: 1. 82 y.o. gentleman with Stage T2a adenocarcinoma of the prostate with Gleason Score of 3+4, and PSA of 3.52.   He will continue to follow up with urology for ongoing PSA determinations and has an appointment scheduled with Dr. Junious Silk on 05/17/17. He understands what to expect with regards to PSA monitoring going forward. He will also resume Flomax until his follow up visit with Dr. Junious Silk.  I will look forward to following his response to treatment via correspondence with urology, and would be happy to continue to participate in his care if clinically indicated. I talked to the patient about what to expect in the future, including his risk for erectile dysfunction and rectal bleeding. I encouraged him to call or return to the office if he has any questions regarding his previous radiation or possible radiation side effects. He was comfortable with this plan and will follow up as needed.    Nicholos Johns, PA-C

## 2017-05-09 DIAGNOSIS — T63451D Toxic effect of venom of hornets, accidental (unintentional), subsequent encounter: Secondary | ICD-10-CM | POA: Diagnosis not present

## 2017-05-09 DIAGNOSIS — T63461D Toxic effect of venom of wasps, accidental (unintentional), subsequent encounter: Secondary | ICD-10-CM | POA: Diagnosis not present

## 2017-05-12 DIAGNOSIS — C61 Malignant neoplasm of prostate: Secondary | ICD-10-CM | POA: Diagnosis not present

## 2017-05-17 DIAGNOSIS — C61 Malignant neoplasm of prostate: Secondary | ICD-10-CM | POA: Diagnosis not present

## 2017-05-17 DIAGNOSIS — C674 Malignant neoplasm of posterior wall of bladder: Secondary | ICD-10-CM | POA: Diagnosis not present

## 2017-05-24 DIAGNOSIS — L509 Urticaria, unspecified: Secondary | ICD-10-CM | POA: Diagnosis not present

## 2017-05-24 DIAGNOSIS — T63441D Toxic effect of venom of bees, accidental (unintentional), subsequent encounter: Secondary | ICD-10-CM | POA: Diagnosis not present

## 2017-06-01 ENCOUNTER — Encounter: Payer: Self-pay | Admitting: *Deleted

## 2017-06-01 ENCOUNTER — Other Ambulatory Visit: Payer: Self-pay | Admitting: *Deleted

## 2017-06-01 NOTE — Patient Outreach (Signed)
HTA High Risk Patient screening completed. No current needs for any of our services. Pt is very independent. He is married to a Marine scientist (retired). He has completed his treatment for prostate cancer and is feeling very well.  I will send him our [facility] information for his future needs.  Eulah Pont. Myrtie Neither, MSN, [facility] Gerontological Nurse Practitioner [facility] Care Management [phone]

## 2017-06-09 DIAGNOSIS — T63451D Toxic effect of venom of hornets, accidental (unintentional), subsequent encounter: Secondary | ICD-10-CM | POA: Diagnosis not present

## 2017-06-09 DIAGNOSIS — T63461D Toxic effect of venom of wasps, accidental (unintentional), subsequent encounter: Secondary | ICD-10-CM | POA: Diagnosis not present

## 2017-06-15 DIAGNOSIS — C44319 Basal cell carcinoma of skin of other parts of face: Secondary | ICD-10-CM | POA: Diagnosis not present

## 2017-07-06 DIAGNOSIS — H0012 Chalazion right lower eyelid: Secondary | ICD-10-CM | POA: Diagnosis not present

## 2017-07-06 DIAGNOSIS — H0100A Unspecified blepharitis right eye, upper and lower eyelids: Secondary | ICD-10-CM | POA: Diagnosis not present

## 2017-07-06 DIAGNOSIS — H0015 Chalazion left lower eyelid: Secondary | ICD-10-CM | POA: Diagnosis not present

## 2017-07-06 DIAGNOSIS — H0100B Unspecified blepharitis left eye, upper and lower eyelids: Secondary | ICD-10-CM | POA: Diagnosis not present

## 2017-07-08 DIAGNOSIS — T63451D Toxic effect of venom of hornets, accidental (unintentional), subsequent encounter: Secondary | ICD-10-CM | POA: Diagnosis not present

## 2017-07-08 DIAGNOSIS — T63461D Toxic effect of venom of wasps, accidental (unintentional), subsequent encounter: Secondary | ICD-10-CM | POA: Diagnosis not present

## 2017-08-03 DIAGNOSIS — T63451D Toxic effect of venom of hornets, accidental (unintentional), subsequent encounter: Secondary | ICD-10-CM | POA: Diagnosis not present

## 2017-08-03 DIAGNOSIS — T63461D Toxic effect of venom of wasps, accidental (unintentional), subsequent encounter: Secondary | ICD-10-CM | POA: Diagnosis not present

## 2017-08-22 DIAGNOSIS — L905 Scar conditions and fibrosis of skin: Secondary | ICD-10-CM | POA: Diagnosis not present

## 2017-08-22 DIAGNOSIS — Z85828 Personal history of other malignant neoplasm of skin: Secondary | ICD-10-CM | POA: Diagnosis not present

## 2017-09-06 DIAGNOSIS — T63451D Toxic effect of venom of hornets, accidental (unintentional), subsequent encounter: Secondary | ICD-10-CM | POA: Diagnosis not present

## 2017-09-06 DIAGNOSIS — T63461D Toxic effect of venom of wasps, accidental (unintentional), subsequent encounter: Secondary | ICD-10-CM | POA: Diagnosis not present

## 2017-09-06 DIAGNOSIS — T63441D Toxic effect of venom of bees, accidental (unintentional), subsequent encounter: Secondary | ICD-10-CM | POA: Diagnosis not present

## 2017-10-11 DIAGNOSIS — T63451D Toxic effect of venom of hornets, accidental (unintentional), subsequent encounter: Secondary | ICD-10-CM | POA: Diagnosis not present

## 2017-10-11 DIAGNOSIS — T63441D Toxic effect of venom of bees, accidental (unintentional), subsequent encounter: Secondary | ICD-10-CM | POA: Diagnosis not present

## 2017-10-11 DIAGNOSIS — T63461D Toxic effect of venom of wasps, accidental (unintentional), subsequent encounter: Secondary | ICD-10-CM | POA: Diagnosis not present

## 2017-10-19 DIAGNOSIS — G4733 Obstructive sleep apnea (adult) (pediatric): Secondary | ICD-10-CM | POA: Diagnosis not present

## 2017-10-28 DIAGNOSIS — I119 Hypertensive heart disease without heart failure: Secondary | ICD-10-CM | POA: Diagnosis not present

## 2017-10-28 DIAGNOSIS — E785 Hyperlipidemia, unspecified: Secondary | ICD-10-CM | POA: Diagnosis not present

## 2017-10-28 DIAGNOSIS — Z8673 Personal history of transient ischemic attack (TIA), and cerebral infarction without residual deficits: Secondary | ICD-10-CM | POA: Diagnosis not present

## 2017-10-28 DIAGNOSIS — I480 Paroxysmal atrial fibrillation: Secondary | ICD-10-CM | POA: Diagnosis not present

## 2017-10-28 DIAGNOSIS — E668 Other obesity: Secondary | ICD-10-CM | POA: Diagnosis not present

## 2017-10-28 DIAGNOSIS — I2510 Atherosclerotic heart disease of native coronary artery without angina pectoris: Secondary | ICD-10-CM | POA: Diagnosis not present

## 2017-10-28 DIAGNOSIS — G4733 Obstructive sleep apnea (adult) (pediatric): Secondary | ICD-10-CM | POA: Diagnosis not present

## 2017-10-31 DIAGNOSIS — I480 Paroxysmal atrial fibrillation: Secondary | ICD-10-CM | POA: Diagnosis not present

## 2017-11-07 DIAGNOSIS — T63451D Toxic effect of venom of hornets, accidental (unintentional), subsequent encounter: Secondary | ICD-10-CM | POA: Diagnosis not present

## 2017-11-07 DIAGNOSIS — T63441D Toxic effect of venom of bees, accidental (unintentional), subsequent encounter: Secondary | ICD-10-CM | POA: Diagnosis not present

## 2017-11-07 DIAGNOSIS — T63461D Toxic effect of venom of wasps, accidental (unintentional), subsequent encounter: Secondary | ICD-10-CM | POA: Diagnosis not present

## 2017-11-08 DIAGNOSIS — C61 Malignant neoplasm of prostate: Secondary | ICD-10-CM | POA: Diagnosis not present

## 2017-11-09 DIAGNOSIS — D1801 Hemangioma of skin and subcutaneous tissue: Secondary | ICD-10-CM | POA: Diagnosis not present

## 2017-11-09 DIAGNOSIS — L821 Other seborrheic keratosis: Secondary | ICD-10-CM | POA: Diagnosis not present

## 2017-11-09 DIAGNOSIS — L570 Actinic keratosis: Secondary | ICD-10-CM | POA: Diagnosis not present

## 2017-11-09 DIAGNOSIS — Z85828 Personal history of other malignant neoplasm of skin: Secondary | ICD-10-CM | POA: Diagnosis not present

## 2017-11-09 DIAGNOSIS — D225 Melanocytic nevi of trunk: Secondary | ICD-10-CM | POA: Diagnosis not present

## 2017-11-09 DIAGNOSIS — D485 Neoplasm of uncertain behavior of skin: Secondary | ICD-10-CM | POA: Diagnosis not present

## 2017-11-15 DIAGNOSIS — C674 Malignant neoplasm of posterior wall of bladder: Secondary | ICD-10-CM | POA: Diagnosis not present

## 2017-11-15 DIAGNOSIS — C61 Malignant neoplasm of prostate: Secondary | ICD-10-CM | POA: Diagnosis not present

## 2017-11-15 DIAGNOSIS — E230 Hypopituitarism: Secondary | ICD-10-CM | POA: Diagnosis not present

## 2017-11-29 ENCOUNTER — Other Ambulatory Visit: Payer: Self-pay

## 2017-11-29 ENCOUNTER — Telehealth: Payer: Self-pay | Admitting: Cardiology

## 2017-11-29 MED ORDER — ISOSORBIDE MONONITRATE ER 30 MG PO TB24: 30.0000 mg | ORAL_TABLET | Freq: Every day | ORAL | 0 refills | Status: DC

## 2017-11-29 MED ORDER — AMIODARONE HCL 200 MG PO TABS: 200.0000 mg | ORAL_TABLET | Freq: Every morning | ORAL | 0 refills | Status: DC

## 2017-11-29 NOTE — Telephone Encounter (Signed)
Med refill has been sent. 

## 2017-11-29 NOTE — Telephone Encounter (Signed)
° ° °  1. Which medications need to be refilled? (please list name of each medication and dose if known) amiodarone 200mg  tab; isosorbide mono ER  2. Which pharmacy/location (including street and city if local pharmacy) is medication to be sent to? [facility] [phone] [address]  3. Do they need a 30 day or 90 day supply? [place]

## 2017-12-02 ENCOUNTER — Telehealth: Payer: Self-pay | Admitting: Cardiology

## 2017-12-02 NOTE — Telephone Encounter (Signed)
Medication was refilled on 11/29/17

## 2017-12-02 NOTE — Telephone Encounter (Signed)
° ° ° °  1. Which medications need to be refilled? (please list name of each medication and dose if known) isosorb mono ER 30mg  tab  2. Which pharmacy/location (including street and city if local pharmacy) is medication to be sent to? [facility] [address] [place]  3. Do they need a 30 day or 90 day supply? [place]

## 2017-12-06 DIAGNOSIS — T63461D Toxic effect of venom of wasps, accidental (unintentional), subsequent encounter: Secondary | ICD-10-CM | POA: Diagnosis not present

## 2017-12-06 DIAGNOSIS — T63451D Toxic effect of venom of hornets, accidental (unintentional), subsequent encounter: Secondary | ICD-10-CM | POA: Diagnosis not present

## 2018-01-09 DIAGNOSIS — T63441D Toxic effect of venom of bees, accidental (unintentional), subsequent encounter: Secondary | ICD-10-CM | POA: Diagnosis not present

## 2018-01-09 DIAGNOSIS — T63451D Toxic effect of venom of hornets, accidental (unintentional), subsequent encounter: Secondary | ICD-10-CM | POA: Diagnosis not present

## 2018-01-09 DIAGNOSIS — T63461D Toxic effect of venom of wasps, accidental (unintentional), subsequent encounter: Secondary | ICD-10-CM | POA: Diagnosis not present

## 2018-01-24 ENCOUNTER — Other Ambulatory Visit: Payer: Self-pay

## 2018-01-24 ENCOUNTER — Encounter (HOSPITAL_COMMUNITY): Payer: Self-pay | Admitting: Emergency Medicine

## 2018-01-24 ENCOUNTER — Emergency Department (HOSPITAL_COMMUNITY)
Admission: EM | Admit: 2018-01-24 | Discharge: 2018-01-24 | Discharge status: Against Medical Advice | Payer: PPO | Attending: Emergency Medicine | Admitting: Emergency Medicine

## 2018-01-24 DIAGNOSIS — R109 Unspecified abdominal pain: Secondary | ICD-10-CM | POA: Diagnosis present

## 2018-01-24 DIAGNOSIS — Z5321 Procedure and treatment not carried out due to patient leaving prior to being seen by health care provider: Secondary | ICD-10-CM | POA: Diagnosis not present

## 2018-01-24 LAB — CBC
HCT: 48.6 % (ref 39.0–52.0)
Hemoglobin: 15.8 g/dL (ref 13.0–17.0)
MCH: 30.7 pg (ref 26.0–34.0)
MCHC: 32.5 g/dL (ref 30.0–36.0)
MCV: 94.4 fL (ref 80.0–100.0)
NRBC: 0 % (ref 0.0–0.2)
Platelets: 281 10*3/uL (ref 150–400)
RBC: 5.15 MIL/uL (ref 4.22–5.81)
RDW: 13.6 % (ref 11.5–15.5)
WBC: 10.1 10*3/uL (ref 4.0–10.5)

## 2018-01-24 LAB — COMPREHENSIVE METABOLIC PANEL
ALT: 30 U/L (ref 0–44)
ANION GAP: 10 (ref 5–15)
AST: 34 U/L (ref 15–41)
Albumin: 4 g/dL (ref 3.5–5.0)
Alkaline Phosphatase: 57 U/L (ref 38–126)
BUN: 20 mg/dL (ref 8–23)
CHLORIDE: 108 mmol/L (ref 98–111)
CO2: 21 mmol/L — ABNORMAL LOW (ref 22–32)
Calcium: 9.2 mg/dL (ref 8.9–10.3)
Creatinine, Ser: 1.07 mg/dL (ref 0.61–1.24)
GFR calc Af Amer: >60 mL/min (ref >60)
GFR calc non Af Amer: >60 mL/min (ref >60)
Glucose, Bld: 178 mg/dL — ABNORMAL HIGH (ref 70–99)
POTASSIUM: 3.8 mmol/L (ref 3.5–5.1)
Sodium: 139 mmol/L (ref 135–145)
Total Bilirubin: 1.9 mg/dL — ABNORMAL HIGH (ref 0.3–1.2)
Total Protein: 6.6 g/dL (ref 6.5–8.1)

## 2018-01-24 LAB — LIPASE, BLOOD: LIPASE: 37 U/L (ref 11–51)

## 2018-01-24 MED ORDER — ONDANSETRON 4 MG PO TBDP
4.0000 mg | ORAL_TABLET | Freq: Once | ORAL | Status: AC | PRN
  Administered 2018-01-24: 4 mg via ORAL
  Filled 2018-01-24: qty 1

## 2018-01-24 NOTE — ED Triage Notes (Addendum)
Patient reports LLQ pain with N/V since 0500 this morning. Denies diarrhea. Denies urinary sx.

## 2018-01-24 NOTE — ED Notes (Signed)
Registration reports patient states they are leaving.

## 2018-02-02 DIAGNOSIS — E230 Hypopituitarism: Secondary | ICD-10-CM | POA: Diagnosis not present

## 2018-02-02 DIAGNOSIS — C61 Malignant neoplasm of prostate: Secondary | ICD-10-CM | POA: Diagnosis not present

## 2018-02-09 DIAGNOSIS — T63451D Toxic effect of venom of hornets, accidental (unintentional), subsequent encounter: Secondary | ICD-10-CM | POA: Diagnosis not present

## 2018-02-09 DIAGNOSIS — T63461D Toxic effect of venom of wasps, accidental (unintentional), subsequent encounter: Secondary | ICD-10-CM | POA: Diagnosis not present

## 2018-02-09 DIAGNOSIS — T63441D Toxic effect of venom of bees, accidental (unintentional), subsequent encounter: Secondary | ICD-10-CM | POA: Diagnosis not present

## 2018-02-13 DIAGNOSIS — E291 Testicular hypofunction: Secondary | ICD-10-CM | POA: Diagnosis not present

## 2018-02-13 DIAGNOSIS — Z8551 Personal history of malignant neoplasm of bladder: Secondary | ICD-10-CM | POA: Diagnosis not present

## 2018-02-13 DIAGNOSIS — R1032 Left lower quadrant pain: Secondary | ICD-10-CM | POA: Diagnosis not present

## 2018-02-13 DIAGNOSIS — C61 Malignant neoplasm of prostate: Secondary | ICD-10-CM | POA: Diagnosis not present

## 2018-02-14 DIAGNOSIS — Z8551 Personal history of malignant neoplasm of bladder: Secondary | ICD-10-CM | POA: Diagnosis not present

## 2018-02-21 DIAGNOSIS — C679 Malignant neoplasm of bladder, unspecified: Secondary | ICD-10-CM | POA: Diagnosis not present

## 2018-02-21 DIAGNOSIS — Z8551 Personal history of malignant neoplasm of bladder: Secondary | ICD-10-CM | POA: Diagnosis not present

## 2018-02-25 ENCOUNTER — Other Ambulatory Visit: Payer: Self-pay | Admitting: Cardiology

## 2018-02-28 DIAGNOSIS — R310 Gross hematuria: Secondary | ICD-10-CM | POA: Diagnosis not present

## 2018-02-28 DIAGNOSIS — R1084 Generalized abdominal pain: Secondary | ICD-10-CM | POA: Diagnosis not present

## 2018-02-28 DIAGNOSIS — C61 Malignant neoplasm of prostate: Secondary | ICD-10-CM | POA: Diagnosis not present

## 2018-02-28 DIAGNOSIS — N135 Crossing vessel and stricture of ureter without hydronephrosis: Secondary | ICD-10-CM | POA: Diagnosis not present

## 2018-03-03 ENCOUNTER — Other Ambulatory Visit: Payer: Self-pay | Admitting: Urology

## 2018-03-03 ENCOUNTER — Telehealth: Payer: Self-pay | Admitting: Cardiology

## 2018-03-03 NOTE — Telephone Encounter (Signed)
New Message:     This message just came from the Answering Service. Connie from [place], would like a call, concerning pt, Charles Wright.

## 2018-03-03 NOTE — Telephone Encounter (Signed)
Spoke with Marlowe Kays and patient is scheduled for cystoscopy 03/24/18 with Dr Junious Silk, needs clearance and ok to hold Plavix 5 days prior. Offered patient appointment on Monday 1/27 in the afternoon, stated he would on his way to [facility]. Message sent to scheduling to call and arrange. Left message on Connie's voicemail. Will call Monday for records as the office is closed.

## 2018-03-06 ENCOUNTER — Ambulatory Visit: Payer: PPO | Admitting: Cardiology

## 2018-03-08 NOTE — Telephone Encounter (Addendum)
Patient has appointment with Dr Debara Pickett next week,  [facility] aware.   Please fax clearance to [phone]

## 2018-03-13 ENCOUNTER — Ambulatory Visit: Payer: PPO | Admitting: Internal Medicine

## 2018-03-13 ENCOUNTER — Encounter: Payer: Self-pay | Admitting: Internal Medicine

## 2018-03-13 VITALS — BP 118/74 | HR 64 | Ht 68.0 in | Wt 224.6 lb

## 2018-03-13 DIAGNOSIS — T63461D Toxic effect of venom of wasps, accidental (unintentional), subsequent encounter: Secondary | ICD-10-CM | POA: Diagnosis not present

## 2018-03-13 DIAGNOSIS — E782 Mixed hyperlipidemia: Secondary | ICD-10-CM | POA: Diagnosis not present

## 2018-03-13 DIAGNOSIS — Z01810 Encounter for preprocedural cardiovascular examination: Secondary | ICD-10-CM

## 2018-03-13 DIAGNOSIS — I10 Essential (primary) hypertension: Secondary | ICD-10-CM

## 2018-03-13 DIAGNOSIS — B029 Zoster without complications: Secondary | ICD-10-CM | POA: Diagnosis not present

## 2018-03-13 DIAGNOSIS — T63451D Toxic effect of venom of hornets, accidental (unintentional), subsequent encounter: Secondary | ICD-10-CM | POA: Diagnosis not present

## 2018-03-13 DIAGNOSIS — I2510 Atherosclerotic heart disease of native coronary artery without angina pectoris: Secondary | ICD-10-CM

## 2018-03-13 NOTE — Telephone Encounter (Signed)
Patient seen in office today by MD

## 2018-03-13 NOTE — Patient Instructions (Signed)
Medication Instructions:  Continue current medications  Per Dr. Debara Pickett, [place] to hold clopidogrel (Plavix) for 5 days prior to surgery  If you need a refill on your cardiac medications before your next appointment, please call your pharmacy.   Follow-Up: At [facility], you and your health needs are our priority.  As part of our continuing mission to provide you with exceptional heart care, we have created designated Provider Care Teams.  These Care Teams include your primary Cardiologist (physician) and Advanced Practice Providers (APPs -  Physician Assistants and Nurse Practitioners) who all work together to provide you with the care you need, when you need it. You will need a follow up appointment in 6 months.  Please call our office 2 months in advance to schedule this appointment.  You may see Dr. Debara Pickett or one of the following Advanced Practice Providers on your designated Care Team: Almyra Deforest, [place] . Fabian Sharp, PA-C  Any Other Special Instructions Will Be Listed Below (If Applicable).

## 2018-03-14 ENCOUNTER — Encounter: Payer: Self-pay | Admitting: Internal Medicine

## 2018-03-14 NOTE — Progress Notes (Signed)
OFFICE NOTE  Chief Complaint:  Preoperative cardiovascular evaluation  Primary Care Physician: Maury Dus, MD  HPI:  Charles Wright is a 83 y.o. male with a past medial history significant for coronary artery disease with non-STEMI in 2016.  He was found to have distal LAD disease not amenable to PCI as well as moderate mid to proximal LAD disease.  Medical therapy was recommended.  He also has a history of bladder cancer, prostate cancer, hyperlipidemia, hypertension and OSA on CPAP.  He has been followed by Dr. Wynonia Lawman.  He maintains on clopidogrel but not aspirin.  Recently he has been having issues with ureteral stricture and is recommended to have a cystoscopy with Dr. Junious Silk.  He would require clearance including holding clopidogrel prior to that procedure.  Today in the office he is coughing significantly.  He apparently had upper respiratory infection and then also had concomitant rash which was painful and vesicular and diagnosed as shingles by a dermatologist that he is friends with.  Besides his shortness of breath related upper respiratory infection as well as chest wall pain from shingles, he denies any chest pain concerning for angina or recent worsening shortness of breath.  PMHx:  Past Medical History:  Diagnosis Date  . Acute gout of left ankle    06-14-2015  . Bladder cancer (HCC)    BCG's tx's  . BPH (benign prostatic hypertrophy)   . CAD (coronary artery disease) CARDIOLOGIST-  DR TILLEY   a. NSTEMI 12/2014 -  99% dLAD-2 (not amenable to PCI), 50% dLAD-1, 60% mLAD. Medical therapy was recommended.  Marland Kitchen GERD (gastroesophageal reflux disease)    takes  OTC periodically  . Gilbert's syndrome   . H/O cardiac catheterization    a. Note: Difficult radial access in 12/2014 - recommend femoral approach if cath needed in the future.  Marland Kitchen History of kidney stones   . History of non-ST elevation myocardial infarction (NSTEMI)    12-12-2014   CARDIAC CATH W/ NO INTERVENTION  .  Hyperlipidemia   . Hypertension   . OSA on CPAP   . Paroxysmal A-fib [facility])    cardiologist-  dr Wynonia Lawman  . Prostate cancer [facility])    last PSA  2.9  (montiored by urologist  dr Junious Silk)  . Wears glasses     Past Surgical History:  Procedure Laterality Date  . BILATERAL INGUINAL HERNIA REPAIR    . CARDIAC CATHETERIZATION N/A 12/13/2014   Procedure: Left Heart Cath and Coronary Angiography;  Surgeon: Leonie Man, MD;  Location: [facility];  Service: Cardiovascular;  Laterality: N/A;  culprit lesion diat LAD-2  99%, not approachable via PCTA  due to extremely tortuous up stream LAD/  mLAD 60% and dLAD-1 50%, both are at the extremely tortuous segment and not PCI targets/  otherwise normal coronary arteries  . CYSTOSCOPY W/ RETROGRADES Left 08/22/2012   Procedure: CYSTOSCOPY WITH RETROGRADE PYELOGRAM;  left kidney washings;  Surgeon: Fredricka Bonine, MD;  Location: [facility];  Service: Urology;  Laterality: Left;  . CYSTOSCOPY W/ RETROGRADES N/A 07/08/2015   Procedure: CYSTOSCOPY WITH RETROGRADE PYELOGRAM;  Surgeon: [name], MD;  Location: [facility];  Service: Urology;  Laterality: N/A;  . CYSTOSCOPY WITH BIOPSY  08/22/2012   Procedure: CYSTOSCOPY WITH BIOPSY;  Surgeon: Fredricka Bonine, MD;  Location: [facility];  Service: Urology;;  . CYSTOSCOPY WITH BIOPSY N/A 11/08/2014   Procedure: CYSTOSCOPY/BIOPSPY BLADDER INSTILLATION OF MARCAINE AND PYRIDIUM;  Surgeon:  [name], MD;  Location: [facility];  Service: Urology;  Laterality: N/A;  . CYSTOSCOPY WITH BIOPSY N/A 07/08/2015   Procedure: CYSTOSCOPY WITH BLADDER BIOPSY, FULGURATION, RETROGRADE PYLOGRAM AND RENAL WASHING;  Surgeon: [name], MD;  Location: [facility];  Service: Urology;  Laterality: N/A;  . CYSTOSCOPY WITH RETROGRADE PYELOGRAM, URETEROSCOPY AND STENT PLACEMENT Bilateral 07/30/2014   Procedure: CYSTOSCOPY  WITH BLADDER BX FULERGATION AND BILATERAL RETROGRADE PYELOGRAM,;  Surgeon: [name], MD;  Location: [facility];  Service: Urology;  Laterality: Bilateral;  . CYSTOSCOPY/RETROGRADE/URETEROSCOPY Bilateral 08/17/2013   Procedure: CYSTOSCOPY, BLADDER BIOPSY WITH BILATERAL RETROGRADE PYELOGRAM, LEFT URETEROSCOPY AND DILATION OF STRICTURE ;  Surgeon: [name], MD;  Location: [facility];  Service: Urology;  Laterality: Bilateral;  . LEFT ATRIAL APPENDAGE OCCLUSION N/A 01/09/2015   Procedure: LEFT ATRIAL APPENDAGE OCCLUSION;  Surgeon: [name], MD;  Location: [place] [facility];  Service: Cardiovascular;  Laterality: N/A;  . LUMBAR LAMINECTOMY/DECOMPRESSION MICRODISCECTOMY Left 12/03/2013   Procedure: Left Lumbar three-four diskectomy with Lumbar four laminectomy ;  Surgeon: Newman Pies, MD;  Location: [place] [facility];  Service: Neurosurgery;  Laterality: Left;  Left Lumbar three-four diskectomy with Lumbar four laminectomy   . ORIF LEFT ANKLE FX  2005  . PROSTATE BIOPSY N/A 08/22/2012   Procedure: BIOPSY TRANSRECTAL ULTRASONIC PROSTATE (TUBP);  Surgeon: Fredricka Bonine, MD;  [facility];  Service: Urology;  Laterality: N/A;  . TEE WITHOUT CARDIOVERSION N/A 12/31/2014   Procedure: TRANSESOPHAGEAL ECHOCARDIOGRAM (TEE);  Surgeon: Pixie Casino, MD;  Location[facility];  Service: Cardiovascular;  Laterality: N/A;  ef 55-60%/  mild MR, TR, and PR/  mild LAE and RAE  . TRANSURETHRAL RESECTION OF BLADDER TUMOR  10/22/2011   Procedure: TRANSURETHRAL RESECTION OF BLADDER TUMOR (TURBT);  Surgeon: Fredricka Bonine, MD;  Location: [facility];  Service: Urology;  Laterality: N/A;  TURBT, LEFT URETEROSCOPY, POSSIBLE URETERAL STENT   . URETEROSCOPY  10/22/2011   Procedure: URETEROSCOPY;  Surgeon: Fredricka Bonine, MD;  Location: [facility];  Service: Urology;  Laterality: Left;  . WISDOM TOOTH EXTRACTION       FAMHx:  Family History  Problem Relation Age of Onset  . Leukemia Mother   . Cancer Mother        leukemia  . Heart attack Father   . Cancer Sister        breast  . Hypertension Neg Hx   . Stroke Neg Hx     SOCHx:   reports that he has never smoked. He has never used smokeless tobacco. He reports current alcohol use of about 7.0 standard drinks of alcohol per week. He reports that he does not use drugs.  ALLERGIES:  Allergies  Allergen Reactions  . Bee Venom Anaphylaxis    Actually Hornets and Wasps.   . Losartan Other (See Comments)    Cough   . Oxycodone Other (See Comments)    ALTERED MENTAL STATIS    ROS: Pertinent items noted in HPI and remainder of comprehensive ROS otherwise negative.  HOME MEDS: Current Outpatient Medications on File Prior to Visit  Medication Sig Dispense Refill  . allopurinol (ZYLOPRIM) 300 MG tablet Take 300 mg by mouth every morning.     Marland Kitchen amiodarone (PACERONE) 200 MG tablet Take 1 tablet (200 mg total) by mouth daily. Schedule appointment before running out 90 tablet 0  . aspirin EC 81 MG EC tablet Take 1 tablet (81 mg total)  by mouth daily.    . Cholecalciferol (VITAMIN D-3) 1000 UNITS CAPS Take 1,000 Units by mouth 2 (two) times daily.     . clopidogrel (PLAVIX) 75 MG tablet Take 75 mg by mouth every morning.    . Colchicine 0.6 MG CAPS Take by mouth. As needed for gout    . diazepam (VALIUM) 10 MG tablet     . EPINEPHrine 0.3 mg/0.3 mL IJ SOAJ injection Inject 0.3 mLs (0.3 mg total) into the muscle once. (Patient not taking: Reported on 10/07/2016) 2 Device 1  . Glucosamine Sulfate (DONA PO) Take 1 tablet by mouth 2 (two) times daily.     Marland Kitchen HYDROcodone-acetaminophen (NORCO/VICODIN) 5-325 MG tablet Take 1 tablet by mouth every 4 (four) hours as needed. (Patient not taking: Reported on 10/07/2016) 10 tablet 0  . isosorbide mononitrate (IMDUR) 30 MG 24 hr tablet Take 1 tablet (30 mg total) by mouth daily. Schedule appointment before  running out 90 tablet 0  . levothyroxine (SYNTHROID, LEVOTHROID) 50 MCG tablet Take 50 mcg by mouth daily before breakfast.    . metoprolol (LOPRESSOR) 50 MG tablet Take 50 mg by mouth 2 (two) times daily.    . Multiple Vitamin (MULTIVITAMIN) tablet Take 1 tablet by mouth 2 (two) times daily.     . nitroGLYCERIN (NITROSTAT) 0.4 MG SL tablet Place 1 tablet (0.4 mg total) under the tongue every 5 (five) minutes x 3 doses as needed for chest pain. (Patient not taking: Reported on 10/07/2016) 25 tablet 12  . pravastatin (PRAVACHOL) 40 MG tablet Take 1 tablet (40 mg total) by mouth daily. (Patient taking differently: Take 40 mg by mouth every morning. ) 30 tablet   . tamsulosin (FLOMAX) 0.4 MG CAPS capsule Take 1 capsule (0.4 mg total) by mouth daily after supper. (Patient not taking: Reported on 05/04/2017) 30 capsule 5   No current facility-administered medications on file prior to visit.     LABS/IMAGING: No results found for this or any previous visit (from the past 48 hour(s)). No results found.  LIPID PANEL:    Component Value Date/Time   CHOL 200 12/12/2014 1721   TRIG 88 12/12/2014 1721   HDL 45 12/12/2014 1721   CHOLHDL 4.4 12/12/2014 1721   VLDL 18 12/12/2014 1721   LDLCALC 137 (H) 12/12/2014 1721     WEIGHTS: Wt Readings from Last 3 Encounters:  03/13/18 224 lb 9.6 oz (101.9 kg)  01/24/18 220 lb (99.8 kg)  05/04/17 222 lb (100.7 kg)    VITALS: BP 118/74   Pulse 64   Ht 5\' 8"  (1.727 m)   Wt 224 lb 9.6 oz (101.9 kg)   BMI 34.15 kg/m   EXAM: General appearance: alert and no distress Neck: no carotid bruit, no JVD and thyroid not enlarged, symmetric, no tenderness/mass/nodules Lungs: clear to auscultation bilaterally Heart: regular rate and rhythm Abdomen: soft, non-tender; bowel sounds normal; no masses,  no organomegaly and obese Extremities: extremities normal, atraumatic, no cyanosis or edema Pulses: 2+ and symmetric Skin: Skin color, texture, turgor normal. No  rashes or lesions Neurologic: Grossly normal : Pleasant  EKG: Normal sinus rhythm at 64- personally reviewed  ASSESSMENT: 1. History of coronary artery disease without angina 2. Acceptable risk for upcoming procedure 3. History of hypertensive heart disease 4. Obesity 5. Hyperlipidemia 6. OSA on CPAP 7. History of TIA  PLAN: 1.   Charles Wright has a history of coronary artery disease but is not currently having any anginal symptoms.  He is at  acceptable risk for upcoming cystoscopy.  He is advised to hold the Plavix 5 days prior to the procedure and restart afterwards as per Dr. Lyndal Rainbow direction.  He can follow-up with [place] in 85months or Dr. Wynonia Lawman should he resume seeing patients at some point in the near future.  Pixie Casino, MD, [facility], [place] Director of the Advanced Lipid Disorders &  Cardiovascular Risk Reduction Clinic Diplomate of the American Board of Clinical Lipidology Attending Cardiologist  Direct Dial: [phone]  Fax: [phone]  Website:  [email or web address]   Nadean Corwin Gaston Dase 03/14/2018, 1:26 PM

## 2018-03-17 DIAGNOSIS — E291 Testicular hypofunction: Secondary | ICD-10-CM | POA: Diagnosis not present

## 2018-03-20 ENCOUNTER — Other Ambulatory Visit: Payer: Self-pay | Admitting: Urology

## 2018-03-21 ENCOUNTER — Other Ambulatory Visit: Payer: Self-pay | Admitting: Urology

## 2018-03-22 ENCOUNTER — Other Ambulatory Visit: Payer: Self-pay

## 2018-03-22 ENCOUNTER — Ambulatory Visit (HOSPITAL_COMMUNITY)
Admission: RE | Admit: 2018-03-22 | Discharge: 2018-03-22 | Disposition: A | Discharge status: Home / Self Care | Payer: PPO | Source: Ambulatory Visit | Attending: Urology | Admitting: Urology

## 2018-03-22 ENCOUNTER — Encounter (HOSPITAL_COMMUNITY)
Admission: RE | Admit: 2018-03-22 | Discharge: 2018-03-22 | Disposition: A | Discharge status: Home / Self Care | Payer: PPO | Source: Ambulatory Visit | Attending: Urology | Admitting: Urology

## 2018-03-22 ENCOUNTER — Encounter (HOSPITAL_BASED_OUTPATIENT_CLINIC_OR_DEPARTMENT_OTHER): Payer: Self-pay | Admitting: *Deleted

## 2018-03-22 DIAGNOSIS — N35919 Unspecified urethral stricture, male, unspecified site: Secondary | ICD-10-CM | POA: Diagnosis not present

## 2018-03-22 DIAGNOSIS — Z01811 Encounter for preprocedural respiratory examination: Secondary | ICD-10-CM | POA: Diagnosis not present

## 2018-03-22 LAB — COMPREHENSIVE METABOLIC PANEL
ALT: 44 U/L (ref 0–44)
AST: 50 U/L — AB (ref 15–41)
Albumin: 3.5 g/dL (ref 3.5–5.0)
Alkaline Phosphatase: 47 U/L (ref 38–126)
Anion gap: 7 (ref 5–15)
BUN: 15 mg/dL (ref 8–23)
CO2: 25 mmol/L (ref 22–32)
Calcium: 8.6 mg/dL — ABNORMAL LOW (ref 8.9–10.3)
Chloride: 104 mmol/L (ref 98–111)
Creatinine, Ser: 1.15 mg/dL (ref 0.61–1.24)
GFR calc Af Amer: >60 mL/min (ref >60)
GFR calc non Af Amer: 60 mL/min — ABNORMAL LOW (ref >60)
Glucose, Bld: 93 mg/dL (ref 70–99)
Potassium: 4.3 mmol/L (ref 3.5–5.1)
Sodium: 136 mmol/L (ref 135–145)
Total Bilirubin: 0.8 mg/dL (ref 0.3–1.2)
Total Protein: 6 g/dL — ABNORMAL LOW (ref 6.5–8.1)

## 2018-03-22 NOTE — H&P (Signed)
Office Visit Report     02/28/2018   --------------------------------------------------------------------------------   Charles Wright  MRN: [id number]  PRIMARY CARE:  Audree Camel. Alyson Ingles, MD  DOB: 12/24/1935, 83 year old Male  REFERRING:  Georgette Dover, MD  SSN: -[id number]  PROVIDER:  [name], M.D.    TREATING:  Jiles Crocker    LOCATION:  [facility] [phone]   --------------------------------------------------------------------------------   CC/HPI: 02/28/2018: He had a normal cystoscopy earlier this month but in light of recent abdominal pain on the left he elected to proceed with a formal CT evaluation of the upper tracks which was performed on the 14th of this month.   IMPRESSION:  1. Short-segment focal narrowing in the left proximal ureter, only  about 3-5 mm in length, with associated low-grade enhancement. There  is some associated fullness of the left collecting system but no  overt hydronephrosis. This could be a benign stricture or less  likely early recurrent malignancy in the ureter. The contrast in the  lumen appears narrowed rather than outlining a luminal mass. This  could also be from recent irritation of the patient passed a stone  recently. Possibilities for workup include retrograde pyelogram  potentially with ureteroscopy, versus surveillance.  2. Several tiny suspected punctate 1-2 mm calculi bilaterally  3. Suspected peripheral fibrosis at the lung bases.  4. Aortic Atherosclerosis (ICD10-I70.0). Coronary atherosclerosis.  5. Suspected lumbar foraminal impingement at L3-4 and L4-5.  Postoperative findings at L4.   Dr Junious Silk called pt with results with plan to have pt undergo cystoscopy, bilateral RGP, left URS, biopsy and stent. Over the past two days the patient reports worsening left lower back pain radiating into the flank and lower abdomen. He also reports increased frequency of urination, mild dysuria and nausea. He has been  afebrile without noted gross hematuria. His last creatinine was 0.9.     ALLERGIES: losartan - Other Reaction, cough OxyCODONE HCl TABS - Other Reaction, mood change Wasp Venom Protein - Skin Rash, Itching, Swelling, Hives, Redness    MEDICATIONS: Doxycycline Hyclate PRN  Levothyroxine Sodium 50 mcg tablet  Allopurinol 300 mg tablet Oral  Amiodarone Hcl 200 mg tablet  Clomiphene Citrate 50 mg tablet 1 tablet PO Monday and Thursday  Clopidogrel 75 mg tablet  Colchicine PRN  EpiPen 0.3 MG/0.3ML DEVI Injection  Glucosamine Sulfate 500 mg capsule Oral  Isosorbide Mononitrate 20 mg tablet Oral  Metoprolol Tartrate 75 mg tablet Oral  Multi-Vitamin/Iron TABS Oral  NITROGLYCERIN SL PRN  Pravastatin Sodium 80 mg tablet Oral  Vitamin D TABS Oral     GU PSH: Cysto Bladder Ureth Biopsy - 2010 Cysto Dilate Ureteral Stricture - 2010 Cysto Fulgurate < 0.5 cm - 2017, 2016, 2016, 2015, 2014 Cysto Uretero Biopsy Fulgura - 2013 Cystoscopy - 02/13/2018, 05/17/2017, 11/15/2016, 04/12/2016, 12/10/2015 Cystoscopy And Biopsy - 2010 Cystoscopy Insert Stent - 2013, 2010, 2010 Cystoscopy TURBT <2 cm - 2013 Cystoscopy Ureteroscopy - 2010 Locm 300-399Mg/Ml Iodine,1Ml - 02/21/2018 PLACE RT DEVICE/MARKER, PROS - 11/12/2016 Prostate Needle Biopsy - 09/20/2016, 2014      [facility] Notes: Oral Surgery  Leg Repair   NON-GU PSH: Surgical Pathology, Gross And Microscopic Examination For Prostate Needle - 09/20/2016    GU PMH: History of bladder cancer - 02/13/2018 LLQ pain (Stable) - 02/13/2018, Abdominal Pain In The Left Lower Belly (LLQ), - 2014 Primary hypogonadism - 02/13/2018 Prostate Cancer - 02/13/2018, - 11/15/2017, - 05/17/2017, - 11/12/2016, - 09/20/2016, - 07/12/2016, - 04/12/2016, - 12/10/2015, Prostate cancer, -  April 19, 2015 Bladder Cancer Posterior - 11/15/2017, - 05/17/2017, - 11/15/2016 Bladder Cancer Lateral - 07/12/2016, - 04/12/2016, - 12/10/2015 Ureteral stricture - 07/12/2016, Ureteral Stricture, - 04-18-12 Ureteral obstruction -  04/12/2016 Abdominal Pain Unspec (Acute), Left, Ketorolac 30 mg IM. No bilateral renal or ureteral calculus noted. No bilateral hydronephrosis but does have dilated left ureter to level of bladder. No stone seen along ureter. No stranding along colon. It appears he has recently passed stone. Ketorolac 10 mg 1 po Q6 hrs prn - 2016-04-18 Bladder Cancer overlapping sites, Malignant neoplasm of overlapping sites of bladder - 19-Apr-2015 BPH w/LUTS, Benign prostatic hyperplasia with urinary obstruction - 2014/04/18 Urinary Frequency, Increased urinary frequency - 04/18/14 Oth GU systems Signs/Symptoms, Bladder pain - April 18, 2014 Cystitis glandularis (w/o hematuria), BCG cystitis - 04/18/2014 Urinary Tract Inf, Unspec site, Pyuria - April 18, 2014, Urinary tract infection, - 2014/04/18 Dysuria, Dysuria - 04/18/14 Bladder tumor/neoplasm, Neoplasm of uncertain behavior of bladder - 04-18-2014 Gross hematuria, Gross hematuria - Apr 18, 2014 Renal calculus, Nephrolithiasis - April 18, 2013 Flank Pain, Generalized abdominal pain - Apr 18, 2012 Hematuria, Unspec, Blood in urine - 18-Apr-2012 History of urolithiasis, Nephrolithiasis - 04/18/2012 Neoplasm of unspecified behavior of unspecified kidney, Renal neoplasm - 2012-04-18 Obstructive and reflux uropathy, Unspec, Obstructive uropathy - 04-18-12 Other microscopic hematuria, Microscopic hematuria - April 18, 2012, Microscopic Hematuria, - 04/18/12 Prostate nodule w/o LUTS, Nodular prostate without lower urinary tract symptoms - 2012-04-18 Prostate, Neoplasm of uncertain behavior, Neoplasm of uncertain behavior of prostate - 2012/04/18      PMH Notes:  2010-08-03 08:55:14 - Note: Nephrolithiasis Of The Left Kidney     NON-GU PMH: Hypogonadotropic hypogonadism - 11/15/2017 Encounter for general adult medical examination without abnormal findings, Encounter for preventive health examination - 19-Apr-2015    FAMILY HISTORY: Death In The Family Father - Runs In Family Death In The Family Mother - Runs In Family No pertinent family history - Other   SOCIAL HISTORY: Marital Status:  Married Preferred Language: English; Race: White Current Smoking Status: Patient has never smoked.  Drinks 1 drinks per day.  Does not use drugs. Drinks 2 caffeinated drinks per day. Has not had a blood transfusion. Patient's occupation Architect.    REVIEW OF SYSTEMS:    GU Review Male:   Patient reports frequent urination, burning/ pain with urination, and trouble starting your stream. Patient denies hard to postpone urination, get up at night to urinate, leakage of urine, stream starts and stops, have to strain to urinate , erection problems, and penile pain.  Gastrointestinal (Upper):   Patient reports nausea. Patient denies vomiting and indigestion/ heartburn.  Gastrointestinal (Lower):   Patient denies diarrhea and constipation.  Constitutional:   Patient denies fever, night sweats, weight loss, and fatigue.  Skin:   Patient denies itching and skin rash/ lesion.  Eyes:   Patient denies blurred vision and double vision.  Ears/ Nose/ Throat:   Patient denies sore throat and sinus problems.  Hematologic/Lymphatic:   Patient denies swollen glands and easy bruising.  Cardiovascular:   Patient denies leg swelling and chest pains.  Respiratory:   Patient denies cough and shortness of breath.  Endocrine:   Patient denies excessive thirst.  Musculoskeletal:   Patient denies back pain and joint pain.  Neurological:   Patient denies headaches and dizziness.  Psychologic:   Patient denies depression and anxiety.   VITAL SIGNS:      02/28/2018 03:07 PM  BP 122/72 mmHg  Temperature 97.5 F / 36.3 C   MULTI-SYSTEM PHYSICAL EXAMINATION:    Constitutional: Well-nourished.  No physical deformities. Normally developed. Good grooming.  Neck: Neck symmetrical, not swollen. Normal tracheal position.  Respiratory: No labored breathing, no use of accessory muscles.   Cardiovascular: Normal temperature, normal extremity pulses, no swelling, no varicosities.  Skin: No paleness, no jaundice,  no cyanosis. No lesion, no ulcer, no rash.  Neurologic / Psychiatric: Oriented to time, oriented to place, oriented to person. No depression, no anxiety, no agitation.  Gastrointestinal: No mass, no tenderness, no rigidity, non obese abdomen. There is mild tenderness of the left CVA and left flank.  Musculoskeletal: Normal gait and station of head and neck.     PAST DATA REVIEWED:  Source Of History:  Patient  Records Review:   Previous Patient Records  Urine Test Review:   Urinalysis  X-Ray Review: C.T. Hematuria: Reviewed Films. Reviewed Report.     02/02/18 11/08/17 05/12/17 07/07/16 11/12/15 02/25/15 02/14/14 04/16/13  PSA  Total PSA <0.015 ng/mL <0.015 ng/mL 0.023 ng/mL 3.52 ng/dl 3.90 ng/dl 3.40  2.69  2.86     02/02/18 11/08/17 05/12/17  Hormones  Testosterone, Total 31.4 ng/dL 29.8 ng/dL 12.2 ng/dL    02/28/18 02/14/18  General Chemistry  BUN  16 mg/dL  Creatinine  0.9 mg/dL  eGFR African American  93.2   eGFR Non-Afr. American  80.4   Urinalysis  Urine Appearance Cloudy    Urine Color Yellow    Urine Glucose Neg mg/dL   Urine Bilirubin Neg mg/dL   Urine Ketones Neg mg/dL   Urine Specific Gravity 1.015    Urine Blood Neg ery/uL   Urine pH <=5.0    Urine Protein Neg mg/dL   Urine Urobilinogen 0.2 mg/dL   Urine Nitrites Neg    Urine Leukocyte Esterase 2+ leu/uL   Urine WBC/hpf 20 - 40/hpf    Urine RBC/hpf 3 - 10/hpf    Urine Epithelial Cells NS (Not Seen)    Urine Bacteria NS (Not Seen)    Urine Mucous Present    Urine Yeast NS (Not Seen)    Urine Trichomonas Not Present    Urine Cystals NS (Not Seen)    Urine Casts NS (Not Seen)    Urine Sperm Not Present     PROCEDURES:          Urinalysis w/Scope Dipstick Dipstick Cont'd Micro  Color: Yellow Bilirubin: Neg mg/dL WBC/hpf: 20 - 40/hpf  Appearance: Cloudy Ketones: Neg mg/dL RBC/hpf: 3 - 10/hpf  Specific Gravity: 1.015 Blood: Neg ery/uL Bacteria: NS (Not Seen)  pH: <=5.0 Protein: Neg mg/dL Cystals: NS  (Not Seen)  Glucose: Neg mg/dL Urobilinogen: 0.2 mg/dL Casts: NS (Not Seen)    Nitrites: Neg Trichomonas: Not Present    Leukocyte Esterase: 2+ leu/uL Mucous: Present      Epithelial Cells: NS (Not Seen)      Yeast: NS (Not Seen)      Sperm: Not Present    ASSESSMENT:      ICD-10 Details  1 GU:   Ureteral stricture - N13.5 Left  2   Flank Pain - R10.84 Left   PLAN:            Medications New Meds: Cefpodoxime Proxetil 200 mg tablet 1 tablet PO BID   #14  0 Refill(s)  Hydrocodone-Acetaminophen 5 mg-325 mg tablet 1 tablet PO Q 6 H PRN   #20  0 Refill(s)  Promethazine Hcl 25 mg tablet 1 tablet PO Q 8 H PRN   #20  0 Refill(s)  Orders Labs Urine Culture, BUN/Creatinine          Schedule Return Visit/Planned Activity: Next Available Appointment - Schedule Surgery          Document Letter(s):  Created for Patient: Clinical Summary         Notes:   I will repeat renal function today and check urine culture. With ureteroscopy pending, I don't think repeating imaging today will be of any benefit. Patient to be empirically began on Cefpodoxime to cover for possible pyelonephritis as well as lower urinary tract infection. Also provided a prescription for Vicodin to help with pain and promethazine for nausea. He has tamsulosin at home and I recommended that he restart that medication to help with ureteral relaxation and lower urinary tract symptoms. I'll inform his urologist about our visit today and try to help facilitate getting the patient scheduled for the above-mentioned procedure. The emergency department follow-up as well as clinic follow-up instructions for worsening symptomology including uncontrollable pain, uncontrollable nausea/vomiting, painful inability to void, persistent gross hematuria, and fevers.   CC Dr Junious Silk        Next Appointment:      Next Appointment: 03/17/2018 10:15 AM    Appointment Type: Laboratory Appointment    Location: [facility] [phone]    Provider: Lab LAB    Reason for Visit: 1 mo total testosterone/ Estradiol/ LH      * Signed by Jiles Crocker on 02/28/18 at 4:24 PM (EST)*     The information contained in this medical record document is considered private and confidential patient information. This information  only be used for the medical diagnosis and/or medical services that are being provided by the patient's selected caregivers. This information can only be distributed outside of the patient's care if the patient agrees and signs waivers of authorization for this information to be sent to an outside source or route.  Addendum: urine cx was negative. T remains very low with an elevated LH.

## 2018-03-22 NOTE — Progress Notes (Signed)
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

## 2018-03-24 ENCOUNTER — Ambulatory Visit (HOSPITAL_BASED_OUTPATIENT_CLINIC_OR_DEPARTMENT_OTHER): Payer: PPO | Admitting: Anesthesiology

## 2018-03-24 ENCOUNTER — Encounter (HOSPITAL_BASED_OUTPATIENT_CLINIC_OR_DEPARTMENT_OTHER)
Admission: RE | Disposition: A | Discharge status: Home / Self Care | Payer: Self-pay | Source: Home / Self Care | Attending: Urology

## 2018-03-24 ENCOUNTER — Other Ambulatory Visit: Payer: Self-pay

## 2018-03-24 ENCOUNTER — Encounter (HOSPITAL_BASED_OUTPATIENT_CLINIC_OR_DEPARTMENT_OTHER): Payer: Self-pay

## 2018-03-24 ENCOUNTER — Ambulatory Visit (HOSPITAL_BASED_OUTPATIENT_CLINIC_OR_DEPARTMENT_OTHER)
Admission: RE | Admit: 2018-03-24 | Discharge: 2018-03-24 | Disposition: A | Discharge status: Home / Self Care | Payer: PPO | Attending: Urology | Admitting: Urology

## 2018-03-24 DIAGNOSIS — I10 Essential (primary) hypertension: Secondary | ICD-10-CM | POA: Insufficient documentation

## 2018-03-24 DIAGNOSIS — I2510 Atherosclerotic heart disease of native coronary artery without angina pectoris: Secondary | ICD-10-CM | POA: Diagnosis not present

## 2018-03-24 DIAGNOSIS — Z885 Allergy status to narcotic agent status: Secondary | ICD-10-CM | POA: Diagnosis not present

## 2018-03-24 DIAGNOSIS — G4733 Obstructive sleep apnea (adult) (pediatric): Secondary | ICD-10-CM | POA: Insufficient documentation

## 2018-03-24 DIAGNOSIS — N135 Crossing vessel and stricture of ureter without hydronephrosis: Secondary | ICD-10-CM | POA: Diagnosis not present

## 2018-03-24 DIAGNOSIS — Z8546 Personal history of malignant neoplasm of prostate: Secondary | ICD-10-CM | POA: Diagnosis not present

## 2018-03-24 DIAGNOSIS — N201 Calculus of ureter: Secondary | ICD-10-CM | POA: Diagnosis not present

## 2018-03-24 DIAGNOSIS — Z7902 Long term (current) use of antithrombotics/antiplatelets: Secondary | ICD-10-CM | POA: Diagnosis not present

## 2018-03-24 DIAGNOSIS — N059 Unspecified nephritic syndrome with unspecified morphologic changes: Secondary | ICD-10-CM | POA: Diagnosis not present

## 2018-03-24 DIAGNOSIS — Z888 Allergy status to other drugs, medicaments and biological substances status: Secondary | ICD-10-CM | POA: Insufficient documentation

## 2018-03-24 DIAGNOSIS — Z79890 Hormone replacement therapy (postmenopausal): Secondary | ICD-10-CM | POA: Insufficient documentation

## 2018-03-24 DIAGNOSIS — Z79899 Other long term (current) drug therapy: Secondary | ICD-10-CM | POA: Diagnosis not present

## 2018-03-24 DIAGNOSIS — Z8551 Personal history of malignant neoplasm of bladder: Secondary | ICD-10-CM | POA: Diagnosis not present

## 2018-03-24 DIAGNOSIS — E785 Hyperlipidemia, unspecified: Secondary | ICD-10-CM | POA: Diagnosis not present

## 2018-03-24 DIAGNOSIS — I480 Paroxysmal atrial fibrillation: Secondary | ICD-10-CM | POA: Insufficient documentation

## 2018-03-24 HISTORY — PX: CYSTOSCOPY WITH STENT PLACEMENT: SHX5790

## 2018-03-24 HISTORY — DX: Zoster without complications: B02.9

## 2018-03-24 HISTORY — DX: Cough, unspecified: R05.9

## 2018-03-24 HISTORY — PX: CYSTOSCOPY/RETROGRADE/URETEROSCOPY: SHX5316

## 2018-03-24 HISTORY — DX: Personal history of (healed) traumatic fracture: Z87.81

## 2018-03-24 HISTORY — DX: Cough: R05

## 2018-03-24 SURGERY — CYSTOSCOPY/RETROGRADE/URETEROSCOPY
Anesthesia: General | Site: Urethra | Laterality: Left

## 2018-03-24 MED ORDER — MEPERIDINE HCL 25 MG/ML IJ SOLN
6.2500 mg | INTRAMUSCULAR | Status: DC | PRN
  Filled 2018-03-24: qty 1

## 2018-03-24 MED ORDER — SODIUM CHLORIDE 0.9 % IR SOLN
Status: DC | PRN
  Administered 2018-03-24: 3000 mL

## 2018-03-24 MED ORDER — OXYCODONE HCL 5 MG/5ML PO SOLN
5.0000 mg | Freq: Once | ORAL | Status: DC | PRN
  Filled 2018-03-24: qty 5

## 2018-03-24 MED ORDER — EPHEDRINE SULFATE-NACL 50-0.9 MG/10ML-% IV SOSY
PREFILLED_SYRINGE | INTRAVENOUS | Status: DC | PRN
  Administered 2018-03-24 (×2): 10 mg via INTRAVENOUS

## 2018-03-24 MED ORDER — ACETAMINOPHEN 325 MG PO TABS
325.0000 mg | ORAL_TABLET | ORAL | Status: DC | PRN
  Filled 2018-03-24: qty 2

## 2018-03-24 MED ORDER — ONDANSETRON HCL 4 MG/2ML IJ SOLN
4.0000 mg | Freq: Once | INTRAMUSCULAR | Status: DC | PRN
  Filled 2018-03-24: qty 2

## 2018-03-24 MED ORDER — IOHEXOL 300 MG/ML  SOLN
INTRAMUSCULAR | Status: DC | PRN
  Administered 2018-03-24: 10 mL via URETHRAL

## 2018-03-24 MED ORDER — LIDOCAINE HCL (CARDIAC) PF 100 MG/5ML IV SOSY
PREFILLED_SYRINGE | INTRAVENOUS | Status: DC | PRN
  Administered 2018-03-24: 80 mg via INTRAVENOUS

## 2018-03-24 MED ORDER — ALBUTEROL SULFATE (2.5 MG/3ML) 0.083% IN NEBU
INHALATION_SOLUTION | RESPIRATORY_TRACT | Status: AC
  Filled 2018-03-24: qty 3

## 2018-03-24 MED ORDER — FENTANYL CITRATE (PF) 100 MCG/2ML IJ SOLN
INTRAMUSCULAR | Status: DC | PRN
  Administered 2018-03-24: 25 ug via INTRAVENOUS
  Administered 2018-03-24: 50 ug via INTRAVENOUS
  Administered 2018-03-24: 25 ug via INTRAVENOUS

## 2018-03-24 MED ORDER — CEFAZOLIN SODIUM-DEXTROSE 2-4 GM/100ML-% IV SOLN
INTRAVENOUS | Status: AC
  Filled 2018-03-24: qty 100

## 2018-03-24 MED ORDER — ONDANSETRON HCL 4 MG/2ML IJ SOLN
INTRAMUSCULAR | Status: DC | PRN
  Administered 2018-03-24: 4 mg via INTRAVENOUS

## 2018-03-24 MED ORDER — PROPOFOL 10 MG/ML IV BOLUS
INTRAVENOUS | Status: DC | PRN
  Administered 2018-03-24: 120 mg via INTRAVENOUS
  Administered 2018-03-24: 80 mg via INTRAVENOUS

## 2018-03-24 MED ORDER — ALBUTEROL SULFATE (2.5 MG/3ML) 0.083% IN NEBU
2.5000 mg | INHALATION_SOLUTION | Freq: Once | RESPIRATORY_TRACT | Status: AC
  Administered 2018-03-24: 2.5 mg via RESPIRATORY_TRACT
  Filled 2018-03-24: qty 3

## 2018-03-24 MED ORDER — PROPOFOL 10 MG/ML IV BOLUS
INTRAVENOUS | Status: AC
  Filled 2018-03-24: qty 20

## 2018-03-24 MED ORDER — LACTATED RINGERS IV SOLN
INTRAVENOUS | Status: DC
  Administered 2018-03-24: 13:00:00 via INTRAVENOUS
  Administered 2018-03-24: 1000 mL via INTRAVENOUS
  Filled 2018-03-24: qty 1000

## 2018-03-24 MED ORDER — LIDOCAINE 2% (20 MG/ML) 5 ML SYRINGE
INTRAMUSCULAR | Status: AC
  Filled 2018-03-24: qty 5

## 2018-03-24 MED ORDER — HYDROCODONE-ACETAMINOPHEN 5-325 MG PO TABS
1.0000 | ORAL_TABLET | Freq: Once | ORAL | Status: AC
  Administered 2018-03-24: 1 via ORAL
  Filled 2018-03-24: qty 1

## 2018-03-24 MED ORDER — FENTANYL CITRATE (PF) 100 MCG/2ML IJ SOLN
INTRAMUSCULAR | Status: AC
  Filled 2018-03-24: qty 2

## 2018-03-24 MED ORDER — HYDROCODONE-ACETAMINOPHEN 5-325 MG PO TABS
ORAL_TABLET | ORAL | Status: AC
  Filled 2018-03-24: qty 1

## 2018-03-24 MED ORDER — PHENYLEPHRINE 40 MCG/ML (10ML) SYRINGE FOR IV PUSH (FOR BLOOD PRESSURE SUPPORT)
PREFILLED_SYRINGE | INTRAVENOUS | Status: DC | PRN
  Administered 2018-03-24: 120 ug via INTRAVENOUS
  Administered 2018-03-24 (×2): 80 ug via INTRAVENOUS

## 2018-03-24 MED ORDER — ACETAMINOPHEN 160 MG/5ML PO SOLN
325.0000 mg | ORAL | Status: DC | PRN
  Filled 2018-03-24: qty 20.3

## 2018-03-24 MED ORDER — SODIUM CHLORIDE 0.9 % IN NEBU
INHALATION_SOLUTION | RESPIRATORY_TRACT | Status: AC
  Filled 2018-03-24: qty 3

## 2018-03-24 MED ORDER — OXYCODONE HCL 5 MG PO TABS
5.0000 mg | ORAL_TABLET | Freq: Once | ORAL | Status: DC | PRN
  Filled 2018-03-24: qty 1

## 2018-03-24 MED ORDER — FENTANYL CITRATE (PF) 100 MCG/2ML IJ SOLN
25.0000 ug | INTRAMUSCULAR | Status: DC | PRN
  Administered 2018-03-24 (×2): 25 ug via INTRAVENOUS
  Filled 2018-03-24: qty 1

## 2018-03-24 MED ORDER — DEXAMETHASONE SODIUM PHOSPHATE 10 MG/ML IJ SOLN
INTRAMUSCULAR | Status: AC
  Filled 2018-03-24: qty 1

## 2018-03-24 MED ORDER — ONDANSETRON HCL 4 MG/2ML IJ SOLN
INTRAMUSCULAR | Status: AC
  Filled 2018-03-24: qty 2

## 2018-03-24 MED ORDER — CEFAZOLIN SODIUM-DEXTROSE 2-4 GM/100ML-% IV SOLN
2.0000 g | Freq: Once | INTRAVENOUS | Status: AC
  Administered 2018-03-24: 2 g via INTRAVENOUS
  Filled 2018-03-24: qty 100

## 2018-03-24 MED ORDER — DEXAMETHASONE SODIUM PHOSPHATE 10 MG/ML IJ SOLN
INTRAMUSCULAR | Status: DC | PRN
  Administered 2018-03-24: 5 mg via INTRAVENOUS

## 2018-03-24 SURGICAL SUPPLY — 44 items
BAG DRAIN URO-CYSTO SKYTR STRL (DRAIN) ×4 IMPLANT
BAG DRN UROCATH (DRAIN) ×1
BASKET LASER NITINOL 1.9FR (BASKET) IMPLANT
BASKET STNLS GEMINI 4WIRE 3FR (BASKET) IMPLANT
BASKET ZERO TIP NITINOL 2.4FR (BASKET) IMPLANT
BRUSH URET BIOPSY 3F (UROLOGICAL SUPPLIES) ×4 IMPLANT
BSKT STON RTRVL 120 1.9FR (BASKET)
BSKT STON RTRVL GEM 120X11 3FR (BASKET)
BSKT STON RTRVL ZERO TP 2.4FR (BASKET)
CATH URET 5FR 28IN CONE TIP (BALLOONS)
CATH URET 5FR 28IN OPEN ENDED (CATHETERS) ×4 IMPLANT
CATH URET 5FR 70CM CONE TIP (BALLOONS) IMPLANT
CATH URET DUAL LUMEN 6-10FR 50 (CATHETERS) IMPLANT
CLOTH BEACON ORANGE TIMEOUT ST (SAFETY) ×4 IMPLANT
CONT SPEC 4OZ CLIKSEAL STRL BL (MISCELLANEOUS) ×8 IMPLANT
ELECT REM PT RETURN 9FT ADLT (ELECTROSURGICAL)
ELECTRODE REM PT RTRN 9FT ADLT (ELECTROSURGICAL) IMPLANT
GLOVE BIO SURGEON STRL SZ7.5 (GLOVE) ×4 IMPLANT
GLOVE BIO SURGEON STRL SZ8.5 (GLOVE) ×4 IMPLANT
GLOVE BIOGEL PI IND STRL 8.5 (GLOVE) ×2 IMPLANT
GLOVE BIOGEL PI INDICATOR 8.5 (GLOVE) ×2
GOWN STRL REUS W/ TWL LRG LVL3 (GOWN DISPOSABLE) ×2 IMPLANT
GOWN STRL REUS W/ TWL XL LVL3 (GOWN DISPOSABLE) ×2 IMPLANT
GOWN STRL REUS W/TWL LRG LVL3 (GOWN DISPOSABLE) ×2
GOWN STRL REUS W/TWL XL LVL3 (GOWN DISPOSABLE) ×6 IMPLANT
GUIDEWIRE ANG ZIPWIRE 038X150 (WIRE) IMPLANT
GUIDEWIRE STR DUAL SENSOR (WIRE) ×4 IMPLANT
INFUSOR MANOMETER BAG 3000ML (MISCELLANEOUS) IMPLANT
IV NS IRRIG 3000ML ARTHROMATIC (IV SOLUTION) ×8 IMPLANT
KIT BALLIN UROMAX 15FX10 (LABEL) IMPLANT
KIT BALLN UROMAX 15FX4 (MISCELLANEOUS) IMPLANT
KIT BALLN UROMAX 26 75X4 (MISCELLANEOUS)
KIT TURNOVER CYSTO (KITS) ×4 IMPLANT
MANIFOLD NEPTUNE II (INSTRUMENTS) ×4 IMPLANT
NS IRRIG 500ML POUR BTL (IV SOLUTION) ×4 IMPLANT
PACK CYSTO (CUSTOM PROCEDURE TRAY) ×4 IMPLANT
SET HIGH PRES BAL DIL (LABEL)
SHEATH ACCESS URETERAL 38CM (SHEATH) IMPLANT
SHEATH URETERAL 12FRX35CM (MISCELLANEOUS) ×4 IMPLANT
STENT URET 6FRX26 CONTOUR (STENTS) ×4 IMPLANT
SYRINGE IRR TOOMEY STRL 70CC (SYRINGE) IMPLANT
TUBE CONNECTING 12'X1/4 (SUCTIONS) ×1
TUBE CONNECTING 12X1/4 (SUCTIONS) ×3 IMPLANT
TUBING UROLOGY SET (TUBING) ×4 IMPLANT

## 2018-03-24 NOTE — Interval H&P Note (Signed)
History and Physical Interval Note:  03/24/2018 9:59 AM  Charles Wright  has presented today for surgery, with the diagnosis of LEFT URETERAL STRICTURE/NARROWING  The various methods of treatment have been discussed with the patient and family. After consideration of risks, benefits and other options for treatment, the patient has consented to  Procedure(s): CYSTOSCOPY/RETROGRADE/URETEROSCOPY (Left) STENT PLACEMENT AND BIOPSY (Left) as a surgical intervention .  The patient's history has been reviewed, patient examined, no change in status, stable for surgery.  I have reviewed the patient's chart and labs.  He has a cough but had a negative chest x-ray.  He said no fever.  No dysuria or gross hematuria.  We discussed this not uncommon in need a staged procedure for a proximal ureteroscopy and he is at a much higher risk given the history of left distal stricture.  All questions answered.  He elects to proceed.   [name]

## 2018-03-24 NOTE — Anesthesia Preprocedure Evaluation (Addendum)
Anesthesia Evaluation  Patient identified by MRN, date of birth, ID band Patient awake    Reviewed: Allergy & Precautions, H&P , Patient's Chart, lab work & pertinent test results, reviewed documented beta blocker date and time   Airway Mallampati: II  TM Distance: >3 FB Neck ROM: full    Dental no notable dental hx. (+) Teeth Intact, Dental Advisory Given,    Pulmonary sleep apnea ,    Pulmonary exam normal breath sounds clear to auscultation       Cardiovascular Exercise Tolerance: Good hypertension, Pt. on medications and Pt. on home beta blockers + CAD   Rhythm:regular Rate:Normal  ECHO 12/16 Study Conclusions  - Left ventricle: The cavity size was normal. Wall thickness was   normal. Systolic function was normal. The estimated ejection   fraction was in the range of 55% to 60%. - Mitral valve: There was mild regurgitation. - Left atrium: The atrium was dilated. - Right atrium: The atrium was dilated. - Atrial septum: Prominant chiari network. No defect or patent   foramen ovale was identified. - Impressions: Watchman TEE:   Neuro/Psych    GI/Hepatic Neg liver ROS, GERD  Medicated and Controlled,  Endo/Other  negative endocrine ROS  Renal/GU      Musculoskeletal  (+) Arthritis ,   Abdominal   Peds  Hematology   Anesthesia Other Findings Hypertension    Hyperlipidemia      Gilbert's syndrome       GERD   Paroxysmal a-fib   CARDIOLOGIST- DR Wynonia Lawman...... NSTEMI 12/2014 - 99% dLAD-2 (not amenable to PCI), 50% dLAD-1, 60% mLAD. Medical therapy was recommended. Acute gout of left ankle   OSA on CPAP           Reproductive/Obstetrics                            Anesthesia Physical  Anesthesia Plan  ASA: III  Anesthesia Plan: General   Post-op Pain Management:    Induction: Intravenous  PONV Risk Score and Plan:   Airway Management Planned: LMA and Oral  ETT  Additional Equipment:   Intra-op Plan:   Post-operative Plan: Extubation in OR  Informed Consent: I have reviewed the patients History and Physical, chart, labs and discussed the procedure including the risks, benefits and alternatives for the proposed anesthesia with the patient or authorized representative who has indicated his/her understanding and acceptance.     Dental Advisory Given and Dental advisory given  Plan Discussed with: CRNA and Surgeon  Anesthesia Plan Comments: (Discussed GA with LMA, possible sore throat, potential need to switch to ETT, N/V, pulmonary aspiration. Questions answered. )        Anesthesia Quick Evaluation

## 2018-03-24 NOTE — Discharge Instructions (Signed)
Ureteral Stent Implantation, Care After Refer to this sheet in the next few weeks. These instructions provide you with information about caring for yourself after your procedure. Your health care provider may also give you more specific instructions. Your treatment has been planned according to current medical practices, but problems sometimes occur. Call your health care provider if you have any problems or questions after your procedure. What can I expect after the procedure? After the procedure, it is common to have:  Nausea.  Mild pain when you urinate. You may feel this pain in your lower back or lower abdomen. Pain should stop within a few minutes after you urinate. This may last for up to 1 week.  A small amount of blood in your urine for several days. Follow these instructions at home:  Medicines  Take over-the-counter and prescription medicines only as told by your health care provider.  If you were prescribed an antibiotic medicine, take it as told by your health care provider. Do not stop taking the antibiotic even if you start to feel better.  Do not drive for 24 hours if you received a sedative.  Do not drive or operate heavy machinery while taking prescription pain medicines. Activity  Return to your normal activities as told by your health care provider. Ask your health care provider what activities are safe for you.  Do not lift anything that is heavier than 10 lb (4.5 kg). Follow this limit for 1 week after your procedure, or for as long as told by your health care provider. General instructions  Watch for any blood in your urine. Call your health care provider if the amount of blood in your urine increases.  If you have a catheter: ? Follow instructions from your health care provider about taking care of your catheter and collection bag. ? Do not take baths, swim, or use a hot tub until your health care provider approves.  Drink enough fluid to keep your urine  clear or pale yellow.  Keep all follow-up visits as told by your health care provider. This is important. Contact a health care provider if:  You have pain that gets worse or does not get better with medicine, especially pain when you urinate.  You have difficulty urinating.  You feel nauseous or you vomit repeatedly during a period of more than 2 days after the procedure. Get help right away if:  Your urine is dark red or has blood clots in it.  You are leaking urine (have incontinence).  The end of the stent comes out of your urethra.  You cannot urinate.  You have sudden, sharp, or severe pain in your abdomen or lower back.  You have a fever. This information is not intended to replace advice given to you by your health care provider. Make sure you discuss any questions you have with your health care provider. Document Released: 09/27/2012 Document Revised: 07/03/2015 Document Reviewed: 08/09/2014 Elsevier Interactive Patient Education  2019 [place] Anesthesia Home Care Instructions  Activity: Get plenty of rest for the remainder of the day. A responsible individual must stay with you for 24 hours following the procedure.  For the next 24 hours, DO NOT: -Drive a car -Paediatric nurse -Drink alcoholic beverages -Take any medication unless instructed by your physician -Make any legal decisions or sign important papers.  Meals: Start with liquid foods such as gelatin or soup. Progress to regular foods as tolerated. Avoid greasy, spicy, heavy foods. If nausea and/or vomiting  occur, drink only clear liquids until the nausea and/or vomiting subsides. Call your physician if vomiting continues.  Special Instructions/Symptoms: Your throat may feel dry or sore from the anesthesia or the breathing tube placed in your throat during surgery. If this causes discomfort, gargle with warm salt water. The discomfort should disappear within 24 hours.  If you had a  scopolamine patch placed behind your ear for the management of post- operative nausea and/or vomiting:  1. The medication in the patch is effective for 72 hours, after which it should be removed.  Wrap patch in a tissue and discard in the trash. Wash hands thoroughly with soap and water. 2. You may remove the patch earlier than 72 hours if you experience unpleasant side effects which may include dry mouth, dizziness or visual disturbances. 3. Avoid touching the patch. Wash your hands with soap and water after contact with the patch.

## 2018-03-24 NOTE — Anesthesia Procedure Notes (Signed)
Procedure Name: LMA Insertion Date/Time: 03/24/2018 10:10 AM Performed by: Raenette Rover, CRNA Pre-anesthesia Checklist: Patient identified, Emergency Drugs available, Suction available and Patient being monitored Patient Re-evaluated:Patient Re-evaluated prior to induction Oxygen Delivery Method: Circle system utilized Preoxygenation: Pre-oxygenation with 100% oxygen Induction Type: IV induction LMA: LMA inserted LMA Size: 4.0 Number of attempts: 1 Placement Confirmation: positive ETCO2,  CO2 detector and breath sounds checked- equal and bilateral Tube secured with: Tape Dental Injury: Teeth and Oropharynx as per pre-operative assessment

## 2018-03-24 NOTE — Op Note (Addendum)
Preoperative diagnosis: Left proximal ureteral stricture, left distal ureteral stricture Postoperative diagnosis: Left UPJ obstruction  Procedure: Cystoscopy with left retrograde pyelogram, left ureteroscopy with brush biopsy and cytology, left ureteral stent  Surgeon: Junious Silk  Anesthesia: General  Indication for procedure: Charles Wright is an 83 year old white male with a history of high-grade TA of the right bladder and over the left ureteral orifice in 2013 and CIS in 2015 and 2016 in the bladder.  He also has a known history of a left distal ureteral stricture going back 10 years or more.  He had left flank pain January 2020 and underwent CT scan of the abdomen and pelvis which showed focal narrowing of the left UPJ/proximal ureter.  Findings: On cystoscopy the urethra and the prostatic urethra were unremarkable.  The bladder was mildly trabeculated but no urothelial lesions.  No stone or foreign body in the bladder.  The right ureteral orifice was in the normal orthotopic position with clear eflux.  The left ureteral orifice was widely patent and slightly more lateral from prior resection but with clear eflux as well.  Left retrograde pyelogram- this outlined a single ureter single collecting system unit.  There was some narrowing in the left distal ureter but it was patent and as seen on the CT the ureter focally narrowed in the left proximal ureter at the left UPJ and then opened into a moderately dilated renal pelvis and normal-appearing sharp calyces.  There were no filling defects.  On left ureteroscopy the left distal ureter was narrow but allowed easy passage of the 4.5 French semirigid ureteroscope which dilates up to 6 [place].  Also ureteral access sheath passed without difficulty.  The area of concern on the CT is a smooth focal narrowing at the UPJ which look more like a UPJ obstruction more stricture and the mucosa appeared normal and smooth.  There was some bleeding in the collecting  system and kidney which made visualization difficult.  I drained the collecting system and sent it for cytology and then filled it back up with clear irrigation and noted no urothelial abnormalities but I would have a low threshold to bring him back to take another look.  Description of procedure: After consent was obtained the patient brought to the operating room.  After adequate anesthesia he was placed in lithotomy position and prepped and draped in the usual sterile fashion.  A timeout was performed to confirm the patient and procedure.  The cystoscope was passed per urethra and the bladder carefully inspected.  I inspected the bladder with a 30 degree and 70 degree lens.  I then cannulated the left ureteral orifice with a 6 [place] open-ended catheter and retrograde injection of contrast was performed.  I passed the 6 [place] open-ended into the mid ureter and it went without difficulty.  It was removed.  I then passed the semirigid 4.5 French ureteroscope without the wire to limit mucosal disruption in a no touch technique and was able to get just up over the iliacs before it was difficult and found no mid to distal ureteral abnormalities.  It was a little narrow but allowed passage of the scope without difficulty.  I then passed a sensor wire into the proximal ureter and backed the scope out.  I then passed an access sheath without difficulty and remove the sensor wire.  I passed a single channel digital ureteroscope through the access sheath and again without the wire to limit mucosal disruption inspected the proximal ureter which focally narrowed  at the UPJ although it was patent.  It looked to me more like a UPJ obstruction.  I was able to pass through it into the collecting system. I evacuated the renal pelvis/collecting system after irrigating out all of the contrast with saline and sent this for cytology. I then filled with fresh saline and reinspected the collecting system and was able to see a bit  better and and did not note any obvious collecting system tumors or urothelial abnormalities.  I then passed a brush biopsy and brush the UPJ narrowing in all 4 quadrants.  This was sent as a brush biopsy.  I then went back into the collecting system and inspected and passed a sensor wire under direct vision.  The access sheath was backed out on the scope and I inspected the ureter all the way out and noted to be normal without urothelial lesion or injury.  The wire was backloaded on the cystoscope and a 626 cm stent advanced.  Good coil was seen in the collecting system and a good coil in the bladder.  The bladder was drained and the scope removed.  He was awakened taken recovery room in stable condition.  Complications: None  Blood loss: Minimal  Specimens To pathology: #1 left renal pelvis/collecting system wash for cytology #2 left renal UPJ narrowing brush biopsy  Drains: 6 x 26 cm left ureteral stent  Disposition: Patient stable to PACU.

## 2018-03-24 NOTE — Transfer of Care (Signed)
Immediate Anesthesia Transfer of Care Note  Patient: Charles Wright  Procedure(s) Performed: CYSTOSCOPY/RETROGRADE/URETEROSCOPY (Left Urethra) STENT PLACEMENT AND BIOPSY (Left Ureter)  Patient Location: PACU  Anesthesia Type:General  Level of Consciousness: awake, alert , oriented and patient cooperative  Airway & Oxygen Therapy: Patient Spontanous Breathing and Patient connected to nasal cannula oxygen  Post-op Assessment: Report given to RN and Post -op Vital signs reviewed and stable  Post vital signs: Reviewed and stable  Last Vitals:  Vitals Value Taken Time  BP 118/68 03/24/2018 11:05 AM  Temp    Pulse 65 03/24/2018 11:10 AM  Resp 16 03/24/2018 11:10 AM  SpO2 95 % 03/24/2018 11:10 AM  Vitals shown include unvalidated device data.  Last Pain:  Vitals:   03/24/18 0851  TempSrc:   PainSc: 2       Patients Stated Pain Goal: 7 (01/74/94 4967)  Complications: No apparent anesthesia complications

## 2018-03-27 ENCOUNTER — Encounter (HOSPITAL_BASED_OUTPATIENT_CLINIC_OR_DEPARTMENT_OTHER): Payer: Self-pay | Admitting: Urology

## 2018-03-27 NOTE — Anesthesia Postprocedure Evaluation (Signed)
Anesthesia Post Note  Patient: Charles Wright  Procedure(s) Performed: CYSTOSCOPY/RETROGRADE/URETEROSCOPY (Left Urethra) STENT PLACEMENT AND BIOPSY (Left Ureter)     Patient location during evaluation: PACU Anesthesia Type: General Level of consciousness: awake and alert Pain management: pain level controlled Vital Signs Assessment: post-procedure vital signs reviewed and stable Respiratory status: spontaneous breathing, nonlabored ventilation, respiratory function stable and patient connected to nasal cannula oxygen Cardiovascular status: blood pressure returned to baseline and stable Postop Assessment: no apparent nausea or vomiting Anesthetic complications: no    Last Vitals:  Vitals:   03/24/18 1215 03/24/18 1310  BP: (!) 143/73 (!) 144/75  Pulse: (!) 56 62  Resp: 13 16  Temp:  36.7 C  SpO2: 94% 96%    Last Pain:  Vitals:   03/24/18 1310  TempSrc: Axillary  PainSc: 4                  Adrik Khim

## 2018-04-03 DIAGNOSIS — I480 Paroxysmal atrial fibrillation: Secondary | ICD-10-CM | POA: Diagnosis not present

## 2018-04-03 DIAGNOSIS — C61 Malignant neoplasm of prostate: Secondary | ICD-10-CM | POA: Diagnosis not present

## 2018-04-03 DIAGNOSIS — E559 Vitamin D deficiency, unspecified: Secondary | ICD-10-CM | POA: Diagnosis not present

## 2018-04-03 DIAGNOSIS — Z0000 Encounter for general adult medical examination without abnormal findings: Secondary | ICD-10-CM | POA: Diagnosis not present

## 2018-04-03 DIAGNOSIS — Z1389 Encounter for screening for other disorder: Secondary | ICD-10-CM | POA: Diagnosis not present

## 2018-04-03 DIAGNOSIS — E039 Hypothyroidism, unspecified: Secondary | ICD-10-CM | POA: Diagnosis not present

## 2018-04-03 DIAGNOSIS — I2510 Atherosclerotic heart disease of native coronary artery without angina pectoris: Secondary | ICD-10-CM | POA: Diagnosis not present

## 2018-04-03 DIAGNOSIS — I129 Hypertensive chronic kidney disease with stage 1 through stage 4 chronic kidney disease, or unspecified chronic kidney disease: Secondary | ICD-10-CM | POA: Diagnosis not present

## 2018-04-03 DIAGNOSIS — Z91038 Other insect allergy status: Secondary | ICD-10-CM | POA: Diagnosis not present

## 2018-04-03 DIAGNOSIS — E7800 Pure hypercholesterolemia, unspecified: Secondary | ICD-10-CM | POA: Diagnosis not present

## 2018-04-03 DIAGNOSIS — N390 Urinary tract infection, site not specified: Secondary | ICD-10-CM | POA: Diagnosis not present

## 2018-04-03 DIAGNOSIS — M109 Gout, unspecified: Secondary | ICD-10-CM | POA: Diagnosis not present

## 2018-04-03 DIAGNOSIS — I700 Atherosclerosis of aorta: Secondary | ICD-10-CM | POA: Diagnosis not present

## 2018-04-10 DIAGNOSIS — Q6211 Congenital occlusion of ureteropelvic junction: Secondary | ICD-10-CM | POA: Diagnosis not present

## 2018-04-10 DIAGNOSIS — E291 Testicular hypofunction: Secondary | ICD-10-CM | POA: Diagnosis not present

## 2018-04-11 DIAGNOSIS — T63461D Toxic effect of venom of wasps, accidental (unintentional), subsequent encounter: Secondary | ICD-10-CM | POA: Diagnosis not present

## 2018-04-11 DIAGNOSIS — T63451D Toxic effect of venom of hornets, accidental (unintentional), subsequent encounter: Secondary | ICD-10-CM | POA: Diagnosis not present

## 2018-04-11 DIAGNOSIS — T63441D Toxic effect of venom of bees, accidental (unintentional), subsequent encounter: Secondary | ICD-10-CM | POA: Diagnosis not present

## 2018-04-14 DIAGNOSIS — G4733 Obstructive sleep apnea (adult) (pediatric): Secondary | ICD-10-CM | POA: Diagnosis not present

## 2018-04-14 IMAGING — MR MR HEAD W/O CM
8 series · 48 of 48 positions shown · non-contrast
Comparison: MRI 06/10/2016

CLINICAL DATA: Loss of vision in the left eye for a short appeared
of time 6 weeks ago.

EXAM:
MRI HEAD WITHOUT CONTRAST
TECHNIQUE: Multiplanar, multiecho pulse sequences of the brain and surrounding
structures were obtained without intravenous contrast.

[Series 2: T1 · sagittal · 5.0mm · 0.45mm/px · 3 of 21 slices shown]
[im 1/21]
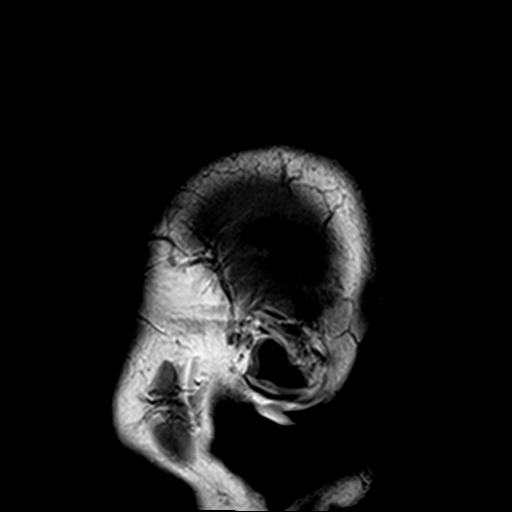
[im 11/21]
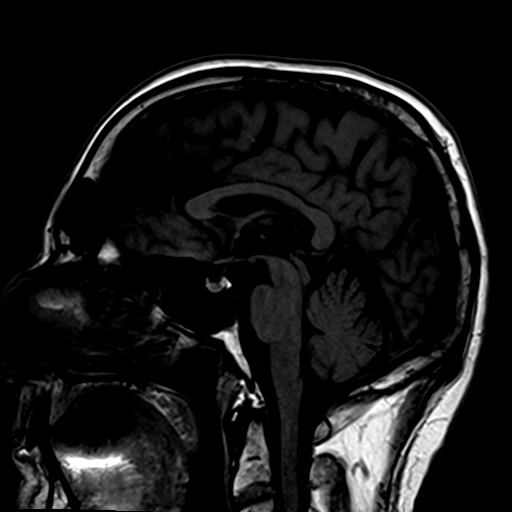
[im 21/21]
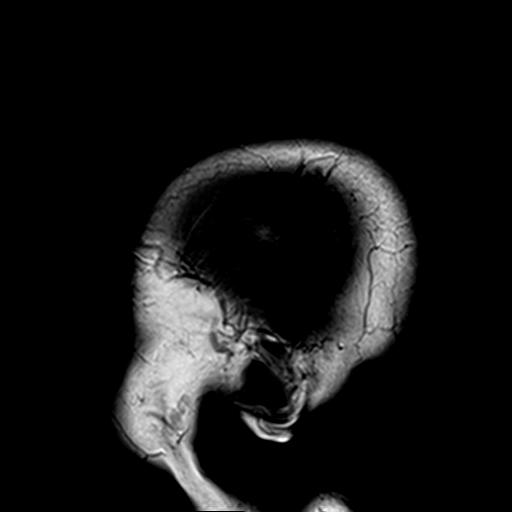

[Series 3: DWI · axial · 3.0mm · 1.80mm/px · z∈[+1,+146]mm · 11 of 98 slices shown (1 of 2)]
[im 1/98]
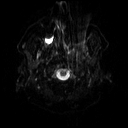
[im 10/98]
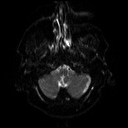
[im 20/98]
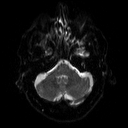
[im 30/98]
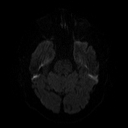
[im 39/98]
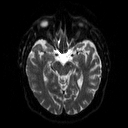
[im 49/98]
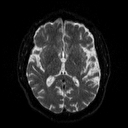
[im 59/98]
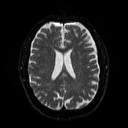
[im 68/98]
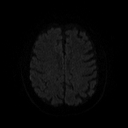
[im 78/98]
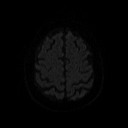
[im 88/98]
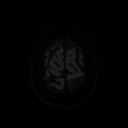
[im 98/98]
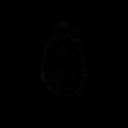

[Series 4: DWI · axial · 3.0mm · 1.80mm/px · z∈[+1,+146]mm · 6 of 49 slices shown (2 of 2)]
[im 1/49]
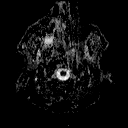
[im 10/49]
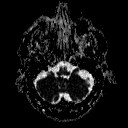
[im 20/49]
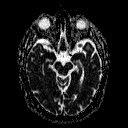
[im 29/49]
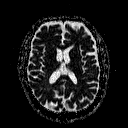
[im 39/49]
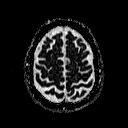
[im 49/49]
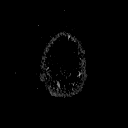

[Series 5: T2 · axial · 5.0mm · 0.51mm/px · z∈[-2,+144]mm · 3 of 22 slices shown (1 of 2)]
[im 1/22]
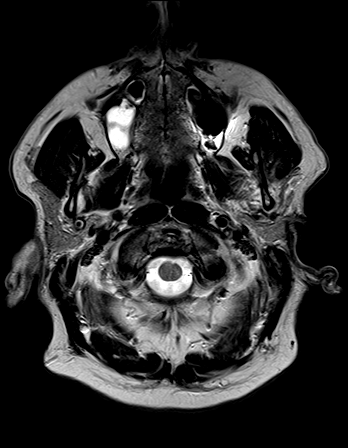
[im 11/22]
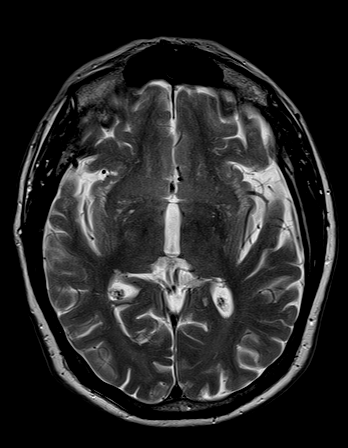
[im 22/22]
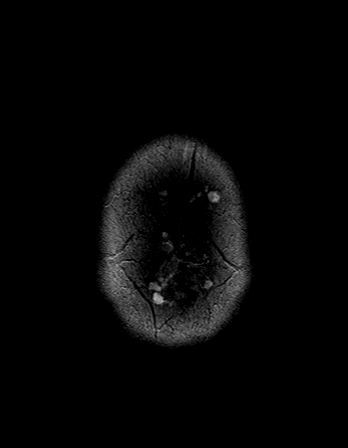

[Series 6: FLAIR · axial · 3.0mm · 0.45mm/px · z∈[+9,+143]mm · 3 of 30 slices shown]
[im 1/30]
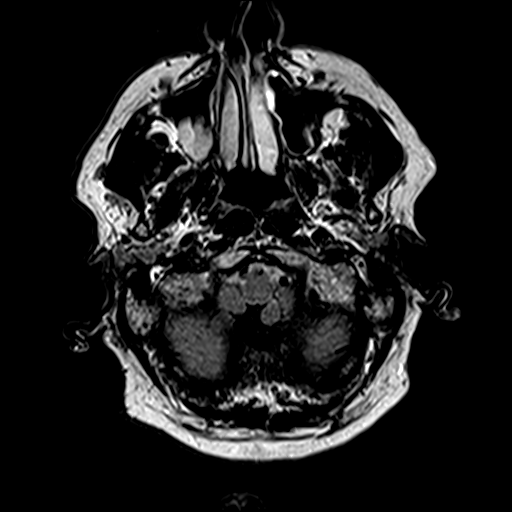
[im 15/30]
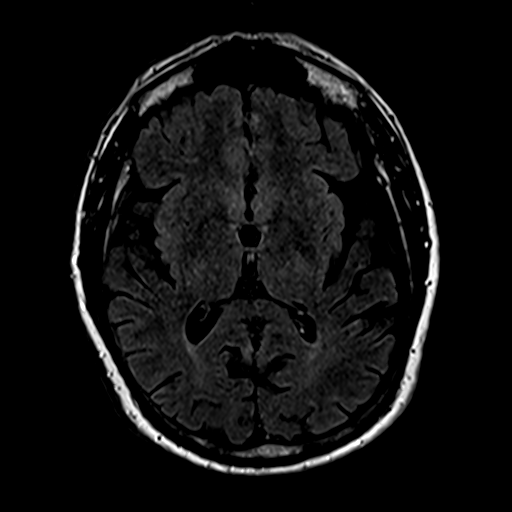
[im 30/30]
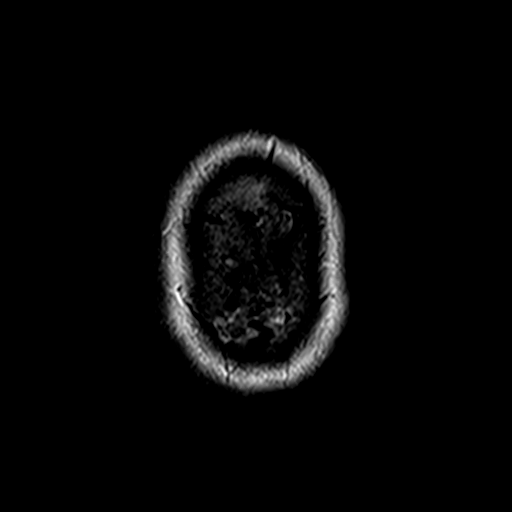

[Series 8: swi_images · axial · 5.0mm · 0.90mm/px · z∈[+3,+147]mm · 3 of 30 slices shown]
[im 1/30]
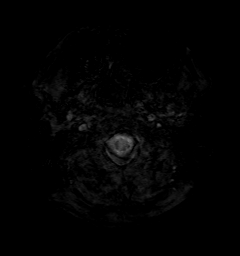
[im 15/30]
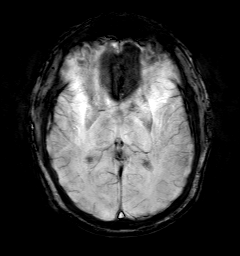
[im 30/30]
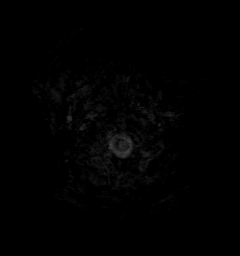

[Series 9: t1_mpr_tra · axial · 1.0mm · 0.72mm/px · z∈[-0,+142]mm · 16 of 144 slices shown]
[im 1/144]
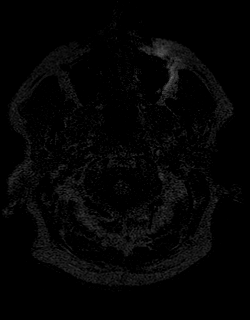
[im 10/144]
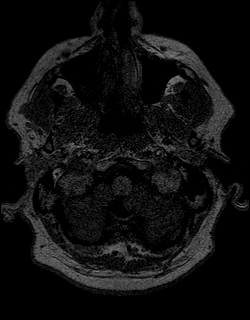
[im 20/144]
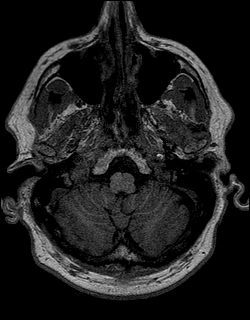
[im 29/144]
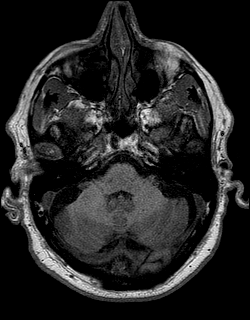
[im 39/144]
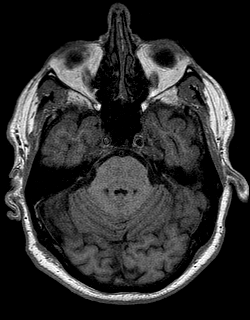
[im 48/144]
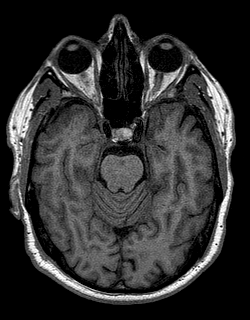
[im 58/144]
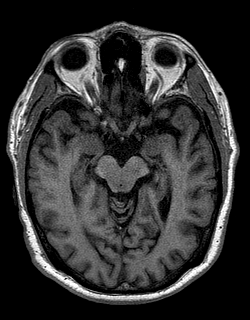
[im 67/144]
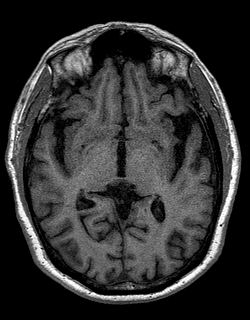
[im 77/144]
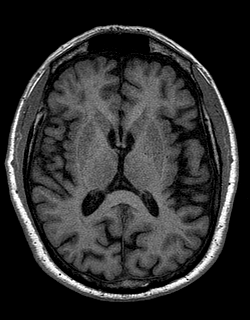
[im 86/144]
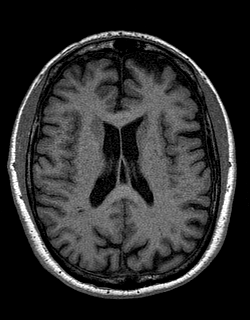
[im 96/144]
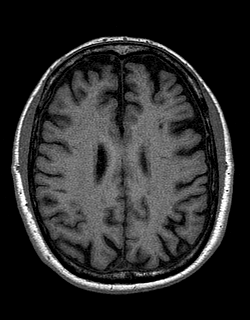
[im 105/144]
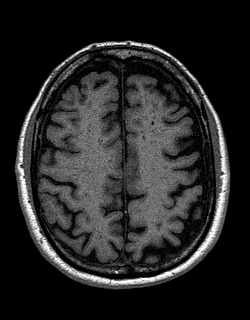
[im 115/144]
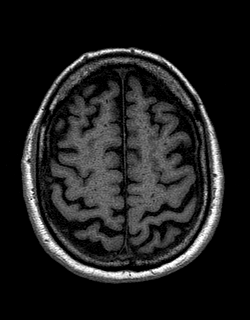
[im 124/144]
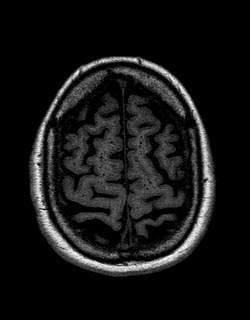
[im 134/144]
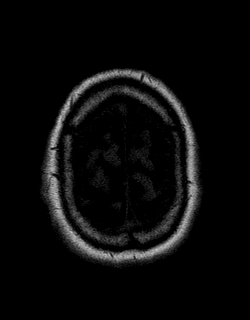
[im 144/144]
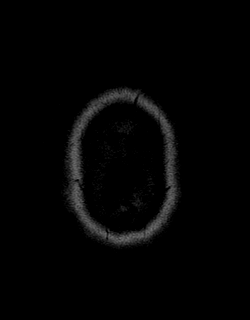

[Series 10: T2 · coronal · 5.0mm · 0.45mm/px · 3 of 27 slices shown (2 of 2)]
[im 1/27]
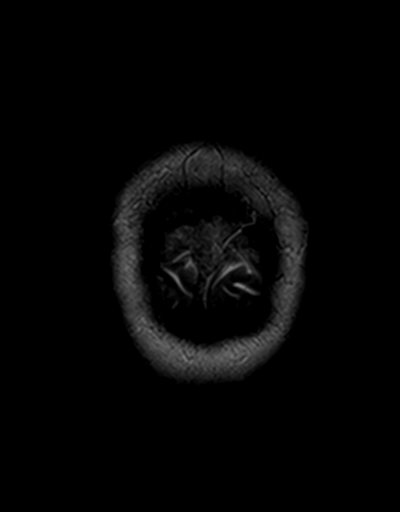
[im 14/27]
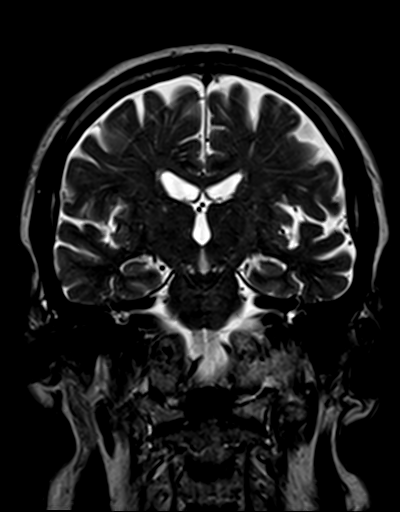
[im 27/27]
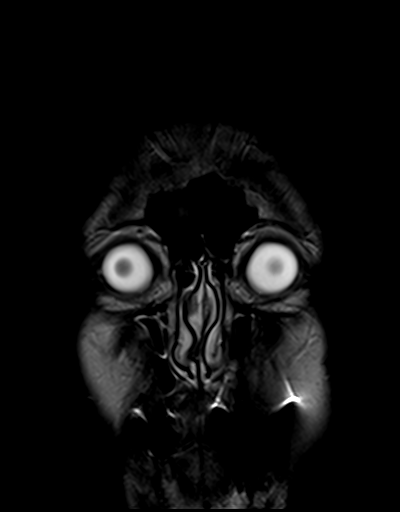

[48 of 48 positions shown; findings below may reference images not displayed]

FINDINGS: Brain: Diffusion imaging does not show any acute or subacute
infarction. There are chronic small-vessel ischemic changes
affecting the pons. No focal cerebellar insult. Cerebral hemispheres
show minimal small vessel change of the white matter, less than
often seen in healthy individuals of this age. No cortical or large
vessel territory infarction. No mass lesion, hemorrhage,
hydrocephalus or extra-axial collection. No pituitary mass.

Vascular: Major vessels at the base of the brain show flow.

Skull and upper cervical spine: Negative

Sinuses/Orbits: Mild mucosal thickening along the inferior maxillary
sinuses. Other sinuses clear. Orbits appear normal. No abnormality
seen of either globe or optic nerve.

Other: None significant
IMPRESSION: No cause of transient left-sided vision loss identified
specifically. The patient does have chronic small-vessel ischemic
change of the pons and to a minimal degree affecting the cerebral
hemispheric deep white matter.

## 2018-05-05 DIAGNOSIS — E039 Hypothyroidism, unspecified: Secondary | ICD-10-CM | POA: Diagnosis not present

## 2018-05-05 DIAGNOSIS — I213 ST elevation (STEMI) myocardial infarction of unspecified site: Secondary | ICD-10-CM | POA: Diagnosis not present

## 2018-05-05 DIAGNOSIS — I2510 Atherosclerotic heart disease of native coronary artery without angina pectoris: Secondary | ICD-10-CM | POA: Diagnosis not present

## 2018-05-05 DIAGNOSIS — M1712 Unilateral primary osteoarthritis, left knee: Secondary | ICD-10-CM | POA: Diagnosis not present

## 2018-05-05 DIAGNOSIS — C61 Malignant neoplasm of prostate: Secondary | ICD-10-CM | POA: Diagnosis not present

## 2018-05-05 DIAGNOSIS — I129 Hypertensive chronic kidney disease with stage 1 through stage 4 chronic kidney disease, or unspecified chronic kidney disease: Secondary | ICD-10-CM | POA: Diagnosis not present

## 2018-05-05 DIAGNOSIS — I10 Essential (primary) hypertension: Secondary | ICD-10-CM | POA: Diagnosis not present

## 2018-05-05 DIAGNOSIS — I480 Paroxysmal atrial fibrillation: Secondary | ICD-10-CM | POA: Diagnosis not present

## 2018-05-08 DIAGNOSIS — T63461D Toxic effect of venom of wasps, accidental (unintentional), subsequent encounter: Secondary | ICD-10-CM | POA: Diagnosis not present

## 2018-05-08 DIAGNOSIS — T63451D Toxic effect of venom of hornets, accidental (unintentional), subsequent encounter: Secondary | ICD-10-CM | POA: Diagnosis not present

## 2018-05-16 DIAGNOSIS — C61 Malignant neoplasm of prostate: Secondary | ICD-10-CM | POA: Diagnosis not present

## 2018-05-16 DIAGNOSIS — E291 Testicular hypofunction: Secondary | ICD-10-CM | POA: Diagnosis not present

## 2018-05-26 DIAGNOSIS — Q6211 Congenital occlusion of ureteropelvic junction: Secondary | ICD-10-CM | POA: Diagnosis not present

## 2018-05-26 DIAGNOSIS — E291 Testicular hypofunction: Secondary | ICD-10-CM | POA: Diagnosis not present

## 2018-06-01 ENCOUNTER — Other Ambulatory Visit: Payer: Self-pay | Admitting: Cardiology

## 2018-06-01 NOTE — Telephone Encounter (Signed)
Rx refill sent to pharmacy. 

## 2018-06-08 DIAGNOSIS — I213 ST elevation (STEMI) myocardial infarction of unspecified site: Secondary | ICD-10-CM | POA: Diagnosis not present

## 2018-06-08 DIAGNOSIS — E7800 Pure hypercholesterolemia, unspecified: Secondary | ICD-10-CM | POA: Diagnosis not present

## 2018-06-08 DIAGNOSIS — I10 Essential (primary) hypertension: Secondary | ICD-10-CM | POA: Diagnosis not present

## 2018-06-08 DIAGNOSIS — I2510 Atherosclerotic heart disease of native coronary artery without angina pectoris: Secondary | ICD-10-CM | POA: Diagnosis not present

## 2018-06-08 DIAGNOSIS — M1712 Unilateral primary osteoarthritis, left knee: Secondary | ICD-10-CM | POA: Diagnosis not present

## 2018-06-08 DIAGNOSIS — T63451D Toxic effect of venom of hornets, accidental (unintentional), subsequent encounter: Secondary | ICD-10-CM | POA: Diagnosis not present

## 2018-06-08 DIAGNOSIS — I129 Hypertensive chronic kidney disease with stage 1 through stage 4 chronic kidney disease, or unspecified chronic kidney disease: Secondary | ICD-10-CM | POA: Diagnosis not present

## 2018-06-08 DIAGNOSIS — I480 Paroxysmal atrial fibrillation: Secondary | ICD-10-CM | POA: Diagnosis not present

## 2018-06-08 DIAGNOSIS — C61 Malignant neoplasm of prostate: Secondary | ICD-10-CM | POA: Diagnosis not present

## 2018-06-08 DIAGNOSIS — T63461D Toxic effect of venom of wasps, accidental (unintentional), subsequent encounter: Secondary | ICD-10-CM | POA: Diagnosis not present

## 2018-06-08 DIAGNOSIS — E039 Hypothyroidism, unspecified: Secondary | ICD-10-CM | POA: Diagnosis not present

## 2018-06-30 DIAGNOSIS — E291 Testicular hypofunction: Secondary | ICD-10-CM | POA: Diagnosis not present

## 2018-06-30 DIAGNOSIS — C61 Malignant neoplasm of prostate: Secondary | ICD-10-CM | POA: Diagnosis not present

## 2018-07-07 DIAGNOSIS — T63451D Toxic effect of venom of hornets, accidental (unintentional), subsequent encounter: Secondary | ICD-10-CM | POA: Diagnosis not present

## 2018-07-07 DIAGNOSIS — T63461D Toxic effect of venom of wasps, accidental (unintentional), subsequent encounter: Secondary | ICD-10-CM | POA: Diagnosis not present

## 2018-07-07 DIAGNOSIS — L509 Urticaria, unspecified: Secondary | ICD-10-CM | POA: Diagnosis not present

## 2018-07-07 DIAGNOSIS — T63441D Toxic effect of venom of bees, accidental (unintentional), subsequent encounter: Secondary | ICD-10-CM | POA: Diagnosis not present

## 2018-07-14 DIAGNOSIS — G4733 Obstructive sleep apnea (adult) (pediatric): Secondary | ICD-10-CM | POA: Diagnosis not present

## 2018-08-07 DIAGNOSIS — T63461D Toxic effect of venom of wasps, accidental (unintentional), subsequent encounter: Secondary | ICD-10-CM | POA: Diagnosis not present

## 2018-08-07 DIAGNOSIS — T63451D Toxic effect of venom of hornets, accidental (unintentional), subsequent encounter: Secondary | ICD-10-CM | POA: Diagnosis not present

## 2018-08-16 DIAGNOSIS — R310 Gross hematuria: Secondary | ICD-10-CM | POA: Diagnosis not present

## 2018-08-16 DIAGNOSIS — Q6211 Congenital occlusion of ureteropelvic junction: Secondary | ICD-10-CM | POA: Diagnosis not present

## 2018-08-16 DIAGNOSIS — E291 Testicular hypofunction: Secondary | ICD-10-CM | POA: Diagnosis not present

## 2018-08-31 DIAGNOSIS — Q6211 Congenital occlusion of ureteropelvic junction: Secondary | ICD-10-CM | POA: Diagnosis not present

## 2018-08-31 DIAGNOSIS — R1084 Generalized abdominal pain: Secondary | ICD-10-CM | POA: Diagnosis not present

## 2018-08-31 DIAGNOSIS — R3121 Asymptomatic microscopic hematuria: Secondary | ICD-10-CM | POA: Diagnosis not present

## 2018-09-05 DIAGNOSIS — N2889 Other specified disorders of kidney and ureter: Secondary | ICD-10-CM | POA: Diagnosis not present

## 2018-09-05 DIAGNOSIS — T63451D Toxic effect of venom of hornets, accidental (unintentional), subsequent encounter: Secondary | ICD-10-CM | POA: Diagnosis not present

## 2018-09-05 DIAGNOSIS — C679 Malignant neoplasm of bladder, unspecified: Secondary | ICD-10-CM | POA: Diagnosis not present

## 2018-09-05 DIAGNOSIS — Q6211 Congenital occlusion of ureteropelvic junction: Secondary | ICD-10-CM | POA: Diagnosis not present

## 2018-09-05 DIAGNOSIS — T63461D Toxic effect of venom of wasps, accidental (unintentional), subsequent encounter: Secondary | ICD-10-CM | POA: Diagnosis not present

## 2018-09-07 DIAGNOSIS — C61 Malignant neoplasm of prostate: Secondary | ICD-10-CM | POA: Diagnosis not present

## 2018-09-07 DIAGNOSIS — E7800 Pure hypercholesterolemia, unspecified: Secondary | ICD-10-CM | POA: Diagnosis not present

## 2018-09-07 DIAGNOSIS — I213 ST elevation (STEMI) myocardial infarction of unspecified site: Secondary | ICD-10-CM | POA: Diagnosis not present

## 2018-09-07 DIAGNOSIS — I129 Hypertensive chronic kidney disease with stage 1 through stage 4 chronic kidney disease, or unspecified chronic kidney disease: Secondary | ICD-10-CM | POA: Diagnosis not present

## 2018-09-07 DIAGNOSIS — I10 Essential (primary) hypertension: Secondary | ICD-10-CM | POA: Diagnosis not present

## 2018-09-07 DIAGNOSIS — M1712 Unilateral primary osteoarthritis, left knee: Secondary | ICD-10-CM | POA: Diagnosis not present

## 2018-09-07 DIAGNOSIS — I2510 Atherosclerotic heart disease of native coronary artery without angina pectoris: Secondary | ICD-10-CM | POA: Diagnosis not present

## 2018-09-07 DIAGNOSIS — E039 Hypothyroidism, unspecified: Secondary | ICD-10-CM | POA: Diagnosis not present

## 2018-09-07 DIAGNOSIS — I480 Paroxysmal atrial fibrillation: Secondary | ICD-10-CM | POA: Diagnosis not present

## 2018-09-13 DIAGNOSIS — G4733 Obstructive sleep apnea (adult) (pediatric): Secondary | ICD-10-CM | POA: Diagnosis not present

## 2018-09-22 DIAGNOSIS — E291 Testicular hypofunction: Secondary | ICD-10-CM | POA: Diagnosis not present

## 2018-09-22 DIAGNOSIS — C61 Malignant neoplasm of prostate: Secondary | ICD-10-CM | POA: Diagnosis not present

## 2018-09-29 DIAGNOSIS — R310 Gross hematuria: Secondary | ICD-10-CM | POA: Diagnosis not present

## 2018-09-29 DIAGNOSIS — Q6211 Congenital occlusion of ureteropelvic junction: Secondary | ICD-10-CM | POA: Diagnosis not present

## 2018-09-29 DIAGNOSIS — Z8546 Personal history of malignant neoplasm of prostate: Secondary | ICD-10-CM | POA: Diagnosis not present

## 2018-09-29 DIAGNOSIS — E291 Testicular hypofunction: Secondary | ICD-10-CM | POA: Diagnosis not present

## 2018-10-05 DIAGNOSIS — M109 Gout, unspecified: Secondary | ICD-10-CM | POA: Diagnosis not present

## 2018-10-05 DIAGNOSIS — D6869 Other thrombophilia: Secondary | ICD-10-CM | POA: Diagnosis not present

## 2018-10-05 DIAGNOSIS — I700 Atherosclerosis of aorta: Secondary | ICD-10-CM | POA: Diagnosis not present

## 2018-10-05 DIAGNOSIS — T63451D Toxic effect of venom of hornets, accidental (unintentional), subsequent encounter: Secondary | ICD-10-CM | POA: Diagnosis not present

## 2018-10-05 DIAGNOSIS — M1712 Unilateral primary osteoarthritis, left knee: Secondary | ICD-10-CM | POA: Diagnosis not present

## 2018-10-05 DIAGNOSIS — E291 Testicular hypofunction: Secondary | ICD-10-CM | POA: Diagnosis not present

## 2018-10-05 DIAGNOSIS — I25119 Atherosclerotic heart disease of native coronary artery with unspecified angina pectoris: Secondary | ICD-10-CM | POA: Diagnosis not present

## 2018-10-05 DIAGNOSIS — E7800 Pure hypercholesterolemia, unspecified: Secondary | ICD-10-CM | POA: Diagnosis not present

## 2018-10-05 DIAGNOSIS — Z91038 Other insect allergy status: Secondary | ICD-10-CM | POA: Diagnosis not present

## 2018-10-05 DIAGNOSIS — E039 Hypothyroidism, unspecified: Secondary | ICD-10-CM | POA: Diagnosis not present

## 2018-10-05 DIAGNOSIS — T63461D Toxic effect of venom of wasps, accidental (unintentional), subsequent encounter: Secondary | ICD-10-CM | POA: Diagnosis not present

## 2018-10-05 DIAGNOSIS — E559 Vitamin D deficiency, unspecified: Secondary | ICD-10-CM | POA: Diagnosis not present

## 2018-10-05 DIAGNOSIS — I10 Essential (primary) hypertension: Secondary | ICD-10-CM | POA: Diagnosis not present

## 2018-10-05 DIAGNOSIS — I480 Paroxysmal atrial fibrillation: Secondary | ICD-10-CM | POA: Diagnosis not present

## 2018-10-06 DIAGNOSIS — I10 Essential (primary) hypertension: Secondary | ICD-10-CM | POA: Diagnosis not present

## 2018-10-06 DIAGNOSIS — I480 Paroxysmal atrial fibrillation: Secondary | ICD-10-CM | POA: Diagnosis not present

## 2018-10-06 DIAGNOSIS — M1712 Unilateral primary osteoarthritis, left knee: Secondary | ICD-10-CM | POA: Diagnosis not present

## 2018-10-06 DIAGNOSIS — C61 Malignant neoplasm of prostate: Secondary | ICD-10-CM | POA: Diagnosis not present

## 2018-10-06 DIAGNOSIS — I129 Hypertensive chronic kidney disease with stage 1 through stage 4 chronic kidney disease, or unspecified chronic kidney disease: Secondary | ICD-10-CM | POA: Diagnosis not present

## 2018-10-06 DIAGNOSIS — E039 Hypothyroidism, unspecified: Secondary | ICD-10-CM | POA: Diagnosis not present

## 2018-10-06 DIAGNOSIS — E7800 Pure hypercholesterolemia, unspecified: Secondary | ICD-10-CM | POA: Diagnosis not present

## 2018-10-06 DIAGNOSIS — Z8546 Personal history of malignant neoplasm of prostate: Secondary | ICD-10-CM | POA: Diagnosis not present

## 2018-10-06 DIAGNOSIS — I213 ST elevation (STEMI) myocardial infarction of unspecified site: Secondary | ICD-10-CM | POA: Diagnosis not present

## 2018-10-06 DIAGNOSIS — I2510 Atherosclerotic heart disease of native coronary artery without angina pectoris: Secondary | ICD-10-CM | POA: Diagnosis not present

## 2018-10-06 DIAGNOSIS — I25119 Atherosclerotic heart disease of native coronary artery with unspecified angina pectoris: Secondary | ICD-10-CM | POA: Diagnosis not present

## 2018-10-10 ENCOUNTER — Other Ambulatory Visit: Payer: Self-pay

## 2018-10-10 ENCOUNTER — Encounter: Payer: Self-pay | Admitting: Internal Medicine

## 2018-10-10 ENCOUNTER — Ambulatory Visit (INDEPENDENT_AMBULATORY_CARE_PROVIDER_SITE_OTHER): Payer: PPO | Admitting: Internal Medicine

## 2018-10-10 VITALS — BP 106/72 | HR 57 | Ht 67.0 in | Wt 219.0 lb

## 2018-10-10 DIAGNOSIS — I10 Essential (primary) hypertension: Secondary | ICD-10-CM | POA: Diagnosis not present

## 2018-10-10 DIAGNOSIS — E039 Hypothyroidism, unspecified: Secondary | ICD-10-CM

## 2018-10-10 DIAGNOSIS — E782 Mixed hyperlipidemia: Secondary | ICD-10-CM | POA: Diagnosis not present

## 2018-10-10 DIAGNOSIS — I2510 Atherosclerotic heart disease of native coronary artery without angina pectoris: Secondary | ICD-10-CM | POA: Diagnosis not present

## 2018-10-10 DIAGNOSIS — I480 Paroxysmal atrial fibrillation: Secondary | ICD-10-CM | POA: Diagnosis not present

## 2018-10-10 LAB — COMPREHENSIVE METABOLIC PANEL
ALT: 18 IU/L (ref 0–44)
AST: 26 IU/L (ref 0–40)
Albumin/Globulin Ratio: 2.1 (ref 1.2–2.2)
Albumin: 4.1 g/dL (ref 3.6–4.6)
Alkaline Phosphatase: 61 IU/L (ref 39–117)
BUN/Creatinine Ratio: 12 (ref 10–24)
BUN: 15 mg/dL (ref 8–27)
Bilirubin Total: 1.5 mg/dL — ABNORMAL HIGH (ref 0.0–1.2)
CO2: 22 mmol/L (ref 20–29)
Calcium: 9.3 mg/dL (ref 8.6–10.2)
Chloride: 101 mmol/L (ref 96–106)
Creatinine, Ser: 1.27 mg/dL (ref 0.76–1.27)
GFR calc Af Amer: 61 mL/min/{1.73_m2} (ref >59)
GFR calc non Af Amer: 53 mL/min/{1.73_m2} — ABNORMAL LOW (ref >59)
Globulin, Total: 2 g/dL (ref 1.5–4.5)
Glucose: 107 mg/dL — ABNORMAL HIGH (ref 65–99)
Potassium: 5.2 mmol/L (ref 3.5–5.2)
Sodium: 137 mmol/L (ref 134–144)
Total Protein: 6.1 g/dL (ref 6.0–8.5)

## 2018-10-10 LAB — LIPID PANEL
Chol/HDL Ratio: 4.6 ratio (ref 0.0–5.0)
Cholesterol, Total: 195 mg/dL (ref 100–199)
HDL: 42 mg/dL (ref >39)
LDL Chol Calc (NIH): 129 mg/dL — ABNORMAL HIGH (ref 0–99)
Triglycerides: 136 mg/dL (ref 0–149)
VLDL Cholesterol Cal: 24 mg/dL (ref 5–40)

## 2018-10-10 LAB — TSH: TSH: 7.15 u[IU]/mL — ABNORMAL HIGH (ref 0.450–4.500)

## 2018-10-10 MED ORDER — APIXABAN 5 MG PO TABS: 5.0000 mg | ORAL_TABLET | Freq: Two times a day (BID) | ORAL | 11 refills | Status: DC

## 2018-10-10 NOTE — Progress Notes (Signed)
OFFICE NOTE  Chief Complaint:  Follow-up coronary disease and A. fib  Primary Care Physician: Maury Dus, MD  HPI:  Charles Wright is a 83 y.o. male with a past medial history significant for coronary artery disease with non-STEMI in 2016.  He was found to have distal LAD disease not amenable to PCI as well as moderate mid to proximal LAD disease.  Medical therapy was recommended.  He also has a history of bladder cancer, prostate cancer, hyperlipidemia, hypertension and OSA on CPAP.  He has been followed by Dr. Wynonia Lawman.  He maintains on clopidogrel but not aspirin.  Recently he has been having issues with ureteral stricture and is recommended to have a cystoscopy with Dr. Junious Silk.  He would require clearance including holding clopidogrel prior to that procedure.  Today in the office he is coughing significantly.  He apparently had upper respiratory infection and then also had concomitant rash which was painful and vesicular and diagnosed as shingles by a dermatologist that he is friends with.  Besides his shortness of breath related upper respiratory infection as well as chest wall pain from shingles, he denies any chest pain concerning for angina or recent worsening shortness of breath.  10/10/2018  [name] returns today for follow-up of coronary disease and atrial fibrillation.  He is a former patient of Dr. Wynonia Lawman however Dr. Wynonia Lawman is now retired.  When we last spoke he received preoperative clearance for urologic procedure.  He does have a history of a bladder cancer and hematuria.  Interestingly, based on further chart review, he had had a prior watchman procedure for his A. fib attempt at stroke reduction.  At that time because of bleeding he was not felt to be a candidate for anticoagulation for his A. fib.  He had been on amiodarone.  He was then referred for watchman procedure which shows ultimately unsuccessful.  I was involved in the imaging component of that procedure.  Subsequently  it was recommended he go on aspirin and Plavix since he was not a candidate for anticoagulation due to GU bleeding from bladder cancer.  That has however been successfully treated and has had no further bleeding issues.  He also just had recent ureteral stenting which seems to have been successful.  When reviewing his chart today however it it notes that he is only on clopidogrel monotherapy.  Have an STEMI in 2016 and was found to have significant coronary disease however not amenable to intervention.  He is done well with that without any further chest pain however did have significant fatigue recently.  It turned out this was related to low testosterone and he is now on testosterone replacement.  Likely a side effect of antiandrogen therapy.  Recent labs show total cholesterol 158, HDL 50 LDL, 86 and triglycerides 115.  PMHx:  Past Medical History:  Diagnosis Date   Acute gout of left ankle    06-14-2015   Bladder cancer (HCC)    BCG's tx's   BPH (benign prostatic hypertrophy)    CAD (coronary artery disease) CARDIOLOGIST-  DR TILLEY   a. NSTEMI 12/2014 -  99% dLAD-2 (not amenable to PCI), 50% dLAD-1, 60% mLAD. Medical therapy was recommended.   Cough    HARSH NONPRODUCTIVE COUGH 1 AND 1/2 WEEKS   GERD (gastroesophageal reflux disease)    takes  OTC periodically   Gilbert's syndrome    H/O cardiac catheterization    a. Note: Difficult radial access in 12/2014 - recommend femoral approach  if cath needed in the future.   History of kidney stones    History of non-ST elevation myocardial infarction (NSTEMI)    12-12-2014   CARDIAC CATH W/ NO INTERVENTION X 2FEW DAYS APART   History of spinal fracture 2002   LUMBAR   Hyperlipidemia    Hypertension    OSA on CPAP    SET ON 7 USES SOME NIGHTS   Paroxysmal A-fib [facility])    cardiologist-  dr Wynonia Lawman   Prostate cancer [facility]) 2019   last PSA  2.9  (montiored by urologist  dr Junious Silk) [place] FEB 2019 RAD DONE   Shingles    2  weeks ago   Wears glasses     Past Surgical History:  Procedure Laterality Date   BACK SURGERY  2014   LOWER l 1, 2 4 AND 5   BILATERAL INGUINAL HERNIA REPAIR     CARDIAC CATHETERIZATION N/A 12/13/2014   Procedure: Left Heart Cath and Coronary Angiography;  Surgeon: Leonie Man, MD;  Location: [place] [facility];  Service: Cardiovascular;  Laterality: N/A;  culprit lesion diat LAD-2  99%, not approachable via PCTA  due to extremely tortuous up stream LAD/  mLAD 60% and dLAD-1 50%, both are at the extremely tortuous segment and not PCI targets/  otherwise normal coronary arteries   CYSTOSCOPY W/ RETROGRADES Left 08/22/2012   Procedure: CYSTOSCOPY WITH RETROGRADE PYELOGRAM;  left kidney washings;  Surgeon: Fredricka Bonine, MD;  Location: [facility];  Service: Urology;  Laterality: Left;   CYSTOSCOPY W/ RETROGRADES N/A 07/08/2015   Procedure: CYSTOSCOPY WITH RETROGRADE PYELOGRAM;  Surgeon: [name], MD;  [facility];  Service: Urology;  Laterality: N/A;   CYSTOSCOPY WITH BIOPSY  08/22/2012   Procedure: CYSTOSCOPY WITH BIOPSY;  Surgeon: Fredricka Bonine, MD;  Location: [facility];  Service: Urology;;   CYSTOSCOPY WITH BIOPSY N/A 11/08/2014   Procedure: CYSTOSCOPY/BIOPSPY BLADDER INSTILLATION OF MARCAINE AND PYRIDIUM;  Surgeon: [name], MD;  Location: [facility];  Service: Urology;  Laterality: N/A;   CYSTOSCOPY WITH BIOPSY N/A 07/08/2015   Procedure: CYSTOSCOPY WITH BLADDER BIOPSY, FULGURATION, RETROGRADE PYLOGRAM AND RENAL WASHING;  Surgeon: [name], MD;  [facility];  Service: Urology;  Laterality: N/A;   CYSTOSCOPY WITH RETROGRADE PYELOGRAM, URETEROSCOPY AND STENT PLACEMENT Bilateral 07/30/2014   Procedure: CYSTOSCOPY WITH BLADDER BX FULERGATION AND BILATERAL RETROGRADE PYELOGRAM,;  Surgeon: [name], MD;  Location: [facility];  Service: Urology;   Laterality: Bilateral;   CYSTOSCOPY WITH STENT PLACEMENT Left 03/24/2018   Procedure: STENT PLACEMENT AND BIOPSY;  Surgeon: [name], MD;  Location: [facility];  Service: Urology;  Laterality: Left;   CYSTOSCOPY/RETROGRADE/URETEROSCOPY Bilateral 08/17/2013   Procedure: CYSTOSCOPY, BLADDER BIOPSY WITH BILATERAL RETROGRADE PYELOGRAM, LEFT URETEROSCOPY AND DILATION OF STRICTURE ;  Surgeon: [name], MD;  Location: [facility];  Service: Urology;  Laterality: Bilateral;   CYSTOSCOPY/RETROGRADE/URETEROSCOPY Left 03/24/2018   Procedure: CYSTOSCOPY/RETROGRADE/URETEROSCOPY;  Surgeon: [name], MD;  Location: [facility];  Service: Urology;  Laterality: Left;   LEFT ATRIAL APPENDAGE OCCLUSION N/A 01/09/2015   Procedure: LEFT ATRIAL APPENDAGE OCCLUSION;  Surgeon: [name], MD;  Location: [place] [facility];  Service: Cardiovascular;  Laterality: N/A;   LUMBAR LAMINECTOMY/DECOMPRESSION MICRODISCECTOMY Left 12/03/2013   Procedure: Left Lumbar three-four diskectomy with Lumbar four laminectomy ;  Surgeon: Newman Pies, MD;  Location: [place] [facility];  Service: Neurosurgery;  Laterality: Left;  Left  Lumbar three-four diskectomy with Lumbar four laminectomy    ORIF LEFT ANKLE FX  2005   PROSTATE BIOPSY N/A 08/22/2012   Procedure: BIOPSY TRANSRECTAL ULTRASONIC PROSTATE (TUBP);  Surgeon: Fredricka Bonine, MD;  Location: [facility];  Service: Urology;  Laterality: N/A;   TEE WITHOUT CARDIOVERSION N/A 12/31/2014   Procedure: TRANSESOPHAGEAL ECHOCARDIOGRAM (TEE);  Surgeon: Pixie Casino, MD;  Location: [facility];  Service: Cardiovascular;  Laterality: N/A;  ef 55-60%/  mild MR, TR, and PR/  mild LAE and RAE   TRANSURETHRAL RESECTION OF BLADDER TUMOR  10/22/2011   Procedure: TRANSURETHRAL RESECTION OF BLADDER TUMOR (TURBT);  Surgeon: Fredricka Bonine, MD;  Location: [facility];  Service:  Urology;  Laterality: N/A;  TURBT, LEFT URETEROSCOPY, POSSIBLE URETERAL STENT    URETEROSCOPY  10/22/2011   Procedure: URETEROSCOPY;  Surgeon: Fredricka Bonine, MD;  Location: [facility];  Service: Urology;  Laterality: Left;   WISDOM TOOTH EXTRACTION      FAMHx:  Family History  Problem Relation Age of Onset   Leukemia Mother    Cancer Mother        leukemia   Heart attack Father    Cancer Sister        breast   Hypertension Neg Hx    Stroke Neg Hx     SOCHx:   reports that he has never smoked. He has never used smokeless tobacco. He reports current alcohol use of about 7.0 standard drinks of alcohol per week. He reports that he does not use drugs.  ALLERGIES:  Allergies  Allergen Reactions   Bee Venom Anaphylaxis    Actually Hornets and Wasps.    Losartan Other (See Comments)    Cough    Oxycodone Other (See Comments)    ALTERED MENTAL STATUS GETS MEAN    ROS: Pertinent items noted in HPI and remainder of comprehensive ROS otherwise negative.  HOME MEDS: Current Outpatient Medications on File Prior to Visit  Medication Sig Dispense Refill   allopurinol (ZYLOPRIM) 300 MG tablet Take 300 mg by mouth every morning.      amiodarone (PACERONE) 200 MG tablet Take 1 tablet by mouth once daily 90 tablet 1   Cholecalciferol (VITAMIN D-3) 1000 UNITS CAPS Take 1,000 Units by mouth 2 (two) times daily.      clopidogrel (PLAVIX) 75 MG tablet Take 75 mg by mouth every morning.     EPINEPHrine 0.3 mg/0.3 mL IJ SOAJ injection Inject 0.3 mLs (0.3 mg total) into the muscle once. 2 Device 1   Glucosamine Sulfate (DONA PO) Take 1 tablet by mouth 2 (two) times daily.      isosorbide mononitrate (IMDUR) 30 MG 24 hr tablet Take 1 tablet (30 mg total) by mouth daily. Schedule appointment before running out 90 tablet 0   levothyroxine (SYNTHROID, LEVOTHROID) 50 MCG tablet Take 50 mcg by mouth daily before breakfast.     metoprolol (LOPRESSOR) 50 MG  tablet Take 50 mg by mouth 2 (two) times daily.     Multiple Vitamin (MULTIVITAMIN) tablet Take 1 tablet by mouth 2 (two) times daily.      nitroGLYCERIN (NITROSTAT) 0.4 MG SL tablet Place 1 tablet (0.4 mg total) under the tongue every 5 (five) minutes x 3 doses as needed for chest pain. 25 tablet 12   pravastatin (PRAVACHOL) 40 MG tablet Take 1 tablet (40 mg total) by mouth daily. (Patient taking differently: Take 40 mg by mouth every morning. ) 30 tablet  tamsulosin (FLOMAX) 0.4 MG CAPS capsule Take 1 capsule (0.4 mg total) by mouth daily after supper. 30 capsule 5   No current facility-administered medications on file prior to visit.     LABS/IMAGING: No results found for this or any previous visit (from the past 48 hour(s)). No results found.  LIPID PANEL:    Component Value Date/Time   CHOL 200 12/12/2014 1721   TRIG 88 12/12/2014 1721   HDL 45 12/12/2014 1721   CHOLHDL 4.4 12/12/2014 1721   VLDL 18 12/12/2014 1721   LDLCALC 137 (H) 12/12/2014 1721     WEIGHTS: Wt Readings from Last 3 Encounters:  10/10/18 219 lb (99.3 kg)  03/24/18 220 lb 6.4 oz (100 kg)  03/13/18 224 lb 9.6 oz (101.9 kg)    VITALS: BP 106/72    Pulse (!) 57    Ht 5\' 7"  (1.702 m)    Wt 219 lb (99.3 kg)    BMI 34.30 kg/m   EXAM: General appearance: alert and no distress Neck: no carotid bruit, no JVD and thyroid not enlarged, symmetric, no tenderness/mass/nodules Lungs: clear to auscultation bilaterally Heart: regular rate and rhythm Abdomen: soft, non-tender; bowel sounds normal; no masses,  no organomegaly and obese Extremities: extremities normal, atraumatic, no cyanosis or edema Pulses: 2+ and symmetric Skin: Skin color, texture, turgor normal. No rashes or lesions Neurologic: Grossly normal : Pleasant  EKG: Sinus bradycardia 57-personally reviewed  ASSESSMENT: 1. History of multivessel coronary artery disease, including 90% LAD distal stenosis and 50 to 60% mid LAD stenosis of a  tortuous vessel (12/2014), without angina 2. History of bladder CA with hematuria, resolved 3. Failed Watchman LAA procedure in 2016 4. History of hypertensive heart disease 5. PAF - CHADVASC score of 5 6. Obesity 7. Hyperlipidemia 8. OSA on CPAP 9. History of TIA/stroke 10. Hypothyroidism  PLAN: 1.   Mr. Etue has a complex coronary history with primary LAD disease that was not amenable to intervention.  He did ultimately have a distal LAD occlusion and an STEMI related to that.  This is been managed medically.  He also had a watchman procedure which failed in 2016.  This was pursued due to unacceptable bleeding risks related to bladder cancer which is subsequently been successfully treated.  He has had no further hematuria.  He does have PAF and has had a prior TIA/stroke.  His CHADSVASC score is 5 therefore at higher risk of stroke going forward.  There is no evidence of A. fib today in his EKG and he is on amiodarone.  He also has hypothyroidism.  I would recommend anticoagulation with Eliquis since he is not prohibitive bleeding risk.  I would then discontinue Plavix and start low-dose aspirin.  Finally, is not clear whether he will need long-term antiarrhythmic therapy on amiodarone.  His A. fib may been related to NSTEMI.  I recommend we discontinue the amiodarone especially given his thyroid abnormality.  Plan follow-up with me in 6 months.  Pixie Casino, MD, [facility], [place] Director of the [facility] Lipid [facility] &  Cardiovascular Risk Reduction Clinic Diplomate of the American Board of Clinical Lipidology Attending Cardiologist  Direct Dial: [phone]   Fax: [phone]  Website:  [email or web address] 10/10/2018, 10:42 AM

## 2018-10-10 NOTE — Patient Instructions (Addendum)
Medication Instructions:  START eliquis 5mg  twice daily START aspirin 81mg  daily STOP amiodarone STOP plavix  If you need a refill on your cardiac medications before your next appointment, please call your pharmacy.   Lab work: Lipid Panel, TSH, CMET today If you have labs (blood work) drawn today and your tests are completely normal, you will receive your results only by: Marland Kitchen MyChart Message (if you have MyChart) OR . A paper copy in the mail If you have any lab test that is abnormal or we need to change your treatment, we will call you to review the results.  Follow-Up: At [facility], you and your health needs are our priority.  As part of our continuing mission to provide you with exceptional heart care, we have created designated Provider Care Teams.  These Care Teams include your primary Cardiologist (physician) and Advanced Practice Providers (APPs -  Physician Assistants and Nurse Practitioners) who all work together to provide you with the care you need, when you need it. You will need a follow up appointment in 6 months.  Please call our office 2 months in advance to schedule this appointment.  You may see Dr. Debara Pickett or one of the following Advanced Practice Providers on your designated Care Team: Almyra Deforest, [place] . Fabian Sharp, PA-C

## 2018-10-19 ENCOUNTER — Telehealth: Payer: Self-pay | Admitting: Internal Medicine

## 2018-10-19 ENCOUNTER — Other Ambulatory Visit: Payer: Self-pay

## 2018-10-19 DIAGNOSIS — E782 Mixed hyperlipidemia: Secondary | ICD-10-CM

## 2018-10-19 NOTE — Telephone Encounter (Signed)
Called patient, gave lab results. Lab work ordered.

## 2018-10-19 NOTE — Telephone Encounter (Signed)
Per pt call returning this office call for results.

## 2018-10-25 DIAGNOSIS — G4733 Obstructive sleep apnea (adult) (pediatric): Secondary | ICD-10-CM | POA: Diagnosis not present

## 2018-11-04 LAB — T4: T4, Total: 8.6 ug/dL (ref 4.5–12.0)

## 2018-11-04 LAB — SPECIMEN STATUS REPORT

## 2018-11-06 DIAGNOSIS — T63461D Toxic effect of venom of wasps, accidental (unintentional), subsequent encounter: Secondary | ICD-10-CM | POA: Diagnosis not present

## 2018-11-06 DIAGNOSIS — T63451D Toxic effect of venom of hornets, accidental (unintentional), subsequent encounter: Secondary | ICD-10-CM | POA: Diagnosis not present

## 2018-11-14 ENCOUNTER — Telehealth: Payer: Self-pay | Admitting: Physician Assistant

## 2018-11-14 DIAGNOSIS — D1801 Hemangioma of skin and subcutaneous tissue: Secondary | ICD-10-CM | POA: Diagnosis not present

## 2018-11-14 DIAGNOSIS — Z85828 Personal history of other malignant neoplasm of skin: Secondary | ICD-10-CM | POA: Diagnosis not present

## 2018-11-14 DIAGNOSIS — L821 Other seborrheic keratosis: Secondary | ICD-10-CM | POA: Diagnosis not present

## 2018-11-14 DIAGNOSIS — D485 Neoplasm of uncertain behavior of skin: Secondary | ICD-10-CM | POA: Diagnosis not present

## 2018-11-14 DIAGNOSIS — D225 Melanocytic nevi of trunk: Secondary | ICD-10-CM | POA: Diagnosis not present

## 2018-11-14 DIAGNOSIS — L570 Actinic keratosis: Secondary | ICD-10-CM | POA: Diagnosis not present

## 2018-11-14 DIAGNOSIS — C44319 Basal cell carcinoma of skin of other parts of face: Secondary | ICD-10-CM | POA: Diagnosis not present

## 2018-11-14 NOTE — Telephone Encounter (Signed)
Charles Wright was seen by Dr. Debara Pickett on 10/10/2018.  He was in atrial fibrillation.  He is on amiodarone.  His CHA2DS2-VASc score is 5 (age x 2, CVA x 2, CAD).  He also has a history of prostate cancer and rectal cancer.  Charles Wright has been compliant with the Eliquis, but developed some rectal bleeding in the last couple of days.  He has had some bright red blood per rectum and also has seen some clots.  He has also had more bleeding than usual when he gets small wounds on his arms or legs.  Charles Wright wanted Dr. Debara Pickett to be aware.  I discussed the situation with the patient.  Advised that the rectal bleeding is a source of concern and requested that he pursue evaluation for this.  He stated he would call Dr. Mariea Clonts and try to get into see him as soon as possible.  I stated if the bleeding is intermittent and not severe, if he could continue the Eliquis until Dr. Debara Pickett has a chance to review the situation and make recommendations, I would appreciate it.  He is agreeable to this.  I advised if the bleeding became worse or if he felt he was losing too much blood, hold the Eliquis until he can discuss the situation with Dr. Debara Pickett.  For now, he agrees to continue the Eliquis.  Charles Ferries, PA-C 11/14/2018 6:47 PM Beeper [phone]

## 2018-11-15 DIAGNOSIS — Z23 Encounter for immunization: Secondary | ICD-10-CM | POA: Diagnosis not present

## 2018-11-15 DIAGNOSIS — K625 Hemorrhage of anus and rectum: Secondary | ICD-10-CM | POA: Diagnosis not present

## 2018-11-15 DIAGNOSIS — I480 Paroxysmal atrial fibrillation: Secondary | ICD-10-CM | POA: Diagnosis not present

## 2018-11-15 NOTE — Telephone Encounter (Signed)
Dr. Thurman Coyer notes indicated history of TIA/stroke - however, I agree, the MRI in 2018 was for possible TIA and did not show evidence of prior stroke. TIA and stroke carry the same risk, however, with regards to anticoagulation for afib.  Dr. Lemmie Evens

## 2018-11-15 NOTE — Telephone Encounter (Signed)
Charles Wright - please contact patient and instruct him to hold Eliquis for 3 days to see if GI bleeding resolves - needs appt with PCP or GI ASAP.   Dr. Lemmie Evens

## 2018-11-15 NOTE — Telephone Encounter (Signed)
Patient called with MD review of chart/history. Voiced understanding & appreciative of call

## 2018-11-15 NOTE — Telephone Encounter (Signed)
Patient called with recommendations. He has an appointment today  He expressed concern that [facility] PA mentioned that he has had strokes. He recalls no such diagnosis. MD note mentions h/o TIA but MRI from Dr. Tomi Likens in 2018 shows no stroke. Patient would like this clarified.

## 2018-11-20 DIAGNOSIS — C44319 Basal cell carcinoma of skin of other parts of face: Secondary | ICD-10-CM | POA: Diagnosis not present

## 2018-11-28 ENCOUNTER — Telehealth: Payer: Self-pay | Admitting: Internal Medicine

## 2018-11-28 NOTE — Telephone Encounter (Signed)
Advised patient, verbalized understanding. He is going to follow up with GI virtually on Monday as well.

## 2018-11-28 NOTE — Telephone Encounter (Signed)
Spoke with patient and he has been having issues with blood in his urine and rectal bleeding, see phone note 10/6. He has had blood in his urine since Thursday and has had 4 episodes of blood with bowel movements which leads to rectal bleeding. The bleeding will last a day or two and clear up. Patient has known hemorrhoids but never bleeding that he is aware of. He has a call into his GI and urologist but would like to know what to do in regards to his Elqiuis. Will forward to Dr Martinique DOD since Dr Debara Pickett is out of the office

## 2018-11-28 NOTE — Telephone Encounter (Signed)
New message   Patient states that he has bleeding from the rectum and in his urine. Patient wants to know if it could be coming from eliquis. Please call the patient.

## 2018-11-28 NOTE — Telephone Encounter (Signed)
I would hold Eliquis for 3 days then resume. Needs to follow up with urology. He should take a stool softener.  Peter Martinique MD, [facility]

## 2018-12-04 DIAGNOSIS — Z8551 Personal history of malignant neoplasm of bladder: Secondary | ICD-10-CM | POA: Diagnosis not present

## 2018-12-04 DIAGNOSIS — K625 Hemorrhage of anus and rectum: Secondary | ICD-10-CM | POA: Diagnosis not present

## 2018-12-04 DIAGNOSIS — I480 Paroxysmal atrial fibrillation: Secondary | ICD-10-CM | POA: Diagnosis not present

## 2018-12-04 NOTE — Telephone Encounter (Signed)
Routed to MD/NL Anticoag Pool

## 2018-12-04 NOTE — Telephone Encounter (Signed)
Returned call to pt he states that he stopped Eliquis for 3 days then restarted 12-01-2018 and states that he is bleeding again. He states that he has appt with urology 12-08-2018 then another appt with [facility] Monday 12-11-2018. He states that he is sure that he does not need to take the stool softener.   Should he continue to take the Eliquis until Urology appt since he continues to have blood in his urine?

## 2018-12-04 NOTE — Telephone Encounter (Signed)
If the bleeding is significant, then he should stop it until he sees the urologist on 10/30.  Dr Lemmie Evens

## 2018-12-04 NOTE — Telephone Encounter (Signed)
This needs MD assessment. Patient has Hx of TIA .

## 2018-12-04 NOTE — Telephone Encounter (Signed)
Follow Up  Pt c/o medication issue:  1. Name of Medication: apixaban (ELIQUIS) 5 MG TABS tablet  2. How are you currently taking this medication (dosage and times per day)?  Take 1 tablet (5 mg total) by mouth 2 (two) times daily. 3. Are you having a reaction (difficulty breathing--STAT)? Yes  4. What is your medication issue? Patient states that he stopped taking the Eliquis for 3 days as he was instructed, and the bleeding in his urine and rectum stopped. Now that he has started back taking the Eliquis, he is now bleeding again. Patient would like to know if he should completely stop taking Eliquis due to the bleeding. Please call back to discuss.

## 2018-12-05 DIAGNOSIS — I10 Essential (primary) hypertension: Secondary | ICD-10-CM | POA: Diagnosis not present

## 2018-12-05 DIAGNOSIS — E039 Hypothyroidism, unspecified: Secondary | ICD-10-CM | POA: Diagnosis not present

## 2018-12-05 DIAGNOSIS — R7301 Impaired fasting glucose: Secondary | ICD-10-CM | POA: Diagnosis not present

## 2018-12-05 DIAGNOSIS — E559 Vitamin D deficiency, unspecified: Secondary | ICD-10-CM | POA: Diagnosis not present

## 2018-12-05 DIAGNOSIS — K625 Hemorrhage of anus and rectum: Secondary | ICD-10-CM | POA: Diagnosis not present

## 2018-12-05 NOTE — Telephone Encounter (Signed)
Pt informed of providers result & recommendations. Pt verbalized understanding. No further questions . ? ?

## 2018-12-07 DIAGNOSIS — R310 Gross hematuria: Secondary | ICD-10-CM | POA: Diagnosis not present

## 2018-12-08 ENCOUNTER — Other Ambulatory Visit: Payer: Self-pay | Admitting: Urology

## 2018-12-08 ENCOUNTER — Other Ambulatory Visit: Payer: Self-pay | Admitting: Student

## 2018-12-08 DIAGNOSIS — R310 Gross hematuria: Secondary | ICD-10-CM

## 2018-12-11 ENCOUNTER — Telehealth: Payer: Self-pay | Admitting: Internal Medicine

## 2018-12-11 DIAGNOSIS — I2510 Atherosclerotic heart disease of native coronary artery without angina pectoris: Secondary | ICD-10-CM | POA: Diagnosis not present

## 2018-12-11 DIAGNOSIS — I480 Paroxysmal atrial fibrillation: Secondary | ICD-10-CM | POA: Diagnosis not present

## 2018-12-11 DIAGNOSIS — K625 Hemorrhage of anus and rectum: Secondary | ICD-10-CM | POA: Diagnosis not present

## 2018-12-11 DIAGNOSIS — T63461D Toxic effect of venom of wasps, accidental (unintentional), subsequent encounter: Secondary | ICD-10-CM | POA: Diagnosis not present

## 2018-12-11 DIAGNOSIS — T63451D Toxic effect of venom of hornets, accidental (unintentional), subsequent encounter: Secondary | ICD-10-CM | POA: Diagnosis not present

## 2018-12-11 NOTE — Telephone Encounter (Signed)
Clinical pharmacist to comment on eliquis

## 2018-12-11 NOTE — Telephone Encounter (Signed)
   [facility] Pre-operative Risk Assessment    Request for surgical clearance:  1. What type of surgery is being performed? Colonoscopy for rectal bleeding, previous colonoscopy 2005  2. When is this surgery scheduled? TBD   3. What type of clearance is required (medical clearance vs. Pharmacy clearance to hold med vs. Both)? Both   4. Are there any medications that need to be held prior to surgery and how long? Eliquis  5. Practice name and name of physician performing surgery?  Deliah Goody PA with [facility]    6. What is your office phone number [phone]    7.   What is your office fax number [phone]  8.   Anesthesia type (None, local, MAC, general) ? Not specified    Charles Wright 12/11/2018, 4:46 PM  _________________________________________________________________   (provider comments below)

## 2018-12-12 NOTE — Telephone Encounter (Signed)
   Primary Cardiologist: Pixie Casino, MD  Chart reviewed as part of pre-operative protocol coverage. Given past medical history and time since last visit, based on ACC/AHA guidelines, DEVONTEZ ASCHER would be at acceptable risk for the planned procedure without further cardiovascular testing.   Per pharmacy, patient with diagnosis of afib on Eliquis for anticoagulation with a CHADS2-VASc score of  6 (HTN, AGE,  stroke/tia x 2, CAD, AGE) and CrCl 51 ml/min  Per office protocol, patient can hold Eliquis for 1 day prior to procedure.    Discussed with patient, if further bright red bleeding and/or clotting occurs, he is to contact either GI or Cardiology for instructions. He reports recent Hb 15.0.   I will route this recommendation to the requesting party via Epic fax function and remove from pre-op pool.  Please call with questions.  Kathyrn Drown, NP 12/12/2018, 8:47 AM

## 2018-12-12 NOTE — Telephone Encounter (Signed)
Patient with diagnosis of afib on Eliquis for anticoagulation.    Procedure: Colonoscopy for rectal bleeding Date of procedure: TBD  CHADS2-VASc score of  6 (HTN, AGE,  stroke/tia x 2, CAD, AGE)  CrCl 51 ml/min  Per office protocol, patient can hold Eliquis for 1 day prior to procedure.

## 2018-12-13 ENCOUNTER — Ambulatory Visit
Admission: RE | Admit: 2018-12-13 | Discharge: 2018-12-13 | Disposition: A | Discharge status: Home / Self Care | Payer: PPO | Source: Ambulatory Visit | Attending: Urology | Admitting: Urology

## 2018-12-13 DIAGNOSIS — N281 Cyst of kidney, acquired: Secondary | ICD-10-CM | POA: Diagnosis not present

## 2018-12-13 DIAGNOSIS — R310 Gross hematuria: Secondary | ICD-10-CM

## 2018-12-14 DIAGNOSIS — G4733 Obstructive sleep apnea (adult) (pediatric): Secondary | ICD-10-CM | POA: Diagnosis not present

## 2018-12-25 ENCOUNTER — Other Ambulatory Visit: Payer: Self-pay | Admitting: Gastroenterology

## 2018-12-28 ENCOUNTER — Other Ambulatory Visit: Payer: Self-pay | Admitting: Gastroenterology

## 2019-01-08 DIAGNOSIS — T63451D Toxic effect of venom of hornets, accidental (unintentional), subsequent encounter: Secondary | ICD-10-CM | POA: Diagnosis not present

## 2019-01-08 DIAGNOSIS — T63461D Toxic effect of venom of wasps, accidental (unintentional), subsequent encounter: Secondary | ICD-10-CM | POA: Diagnosis not present

## 2019-01-09 DIAGNOSIS — Z8546 Personal history of malignant neoplasm of prostate: Secondary | ICD-10-CM | POA: Diagnosis not present

## 2019-01-09 DIAGNOSIS — Z8551 Personal history of malignant neoplasm of bladder: Secondary | ICD-10-CM | POA: Diagnosis not present

## 2019-01-09 DIAGNOSIS — Q6211 Congenital occlusion of ureteropelvic junction: Secondary | ICD-10-CM | POA: Diagnosis not present

## 2019-01-09 DIAGNOSIS — R3121 Asymptomatic microscopic hematuria: Secondary | ICD-10-CM | POA: Diagnosis not present

## 2019-01-12 ENCOUNTER — Encounter (HOSPITAL_COMMUNITY): Payer: Self-pay

## 2019-01-12 ENCOUNTER — Other Ambulatory Visit (HOSPITAL_COMMUNITY)
Admission: RE | Admit: 2019-01-12 | Discharge: 2019-01-12 | Disposition: A | Discharge status: Home / Self Care | Payer: PPO | Source: Ambulatory Visit | Attending: Gastroenterology | Admitting: Gastroenterology

## 2019-01-12 DIAGNOSIS — Z01812 Encounter for preprocedural laboratory examination: Secondary | ICD-10-CM | POA: Insufficient documentation

## 2019-01-12 DIAGNOSIS — Z20828 Contact with and (suspected) exposure to other viral communicable diseases: Secondary | ICD-10-CM | POA: Insufficient documentation

## 2019-01-12 LAB — SARS CORONAVIRUS 2 (TAT 6-24 HRS): SARS Coronavirus 2: NEGATIVE

## 2019-01-15 NOTE — Progress Notes (Signed)
Pt reported that he 'mistakenly' arrived for his Colonscopy procedure today, because he thought the procedure was scheduled for Monday 01/15/19. As a result, he started his bowel prep, and drunk the prescribed 'prep' solution on Sunday 01/14/19. (FYI-pt's procedure is scheduled for Tuesday, 01/16/19).   After arriving for the procedurl, pt indicated that he reported that he had started his bowel prep and completed his 'drink' on Sunday, 01/14/19. He stated that he was then  advised by someone in Endo, to remain on a 'clear liquid diet for the rest of the day and nothing after midnight. And to report for his procedure tomorrow Tuesday 01/16/19 .

## 2019-01-16 ENCOUNTER — Encounter (HOSPITAL_COMMUNITY): Payer: Self-pay

## 2019-01-16 ENCOUNTER — Ambulatory Visit (HOSPITAL_COMMUNITY)
Admission: RE | Admit: 2019-01-16 | Discharge: 2019-01-16 | Disposition: A | Discharge status: Home / Self Care | Payer: PPO | Attending: Gastroenterology | Admitting: Gastroenterology

## 2019-01-16 ENCOUNTER — Other Ambulatory Visit: Payer: Self-pay

## 2019-01-16 ENCOUNTER — Encounter (HOSPITAL_COMMUNITY)
Admission: RE | Disposition: A | Discharge status: Home / Self Care | Payer: Self-pay | Source: Home / Self Care | Attending: Gastroenterology

## 2019-01-16 ENCOUNTER — Other Ambulatory Visit: Payer: Self-pay | Admitting: Gastroenterology

## 2019-01-16 ENCOUNTER — Ambulatory Visit (HOSPITAL_COMMUNITY): Payer: PPO | Admitting: Certified Registered"

## 2019-01-16 DIAGNOSIS — K635 Polyp of colon: Secondary | ICD-10-CM | POA: Diagnosis not present

## 2019-01-16 DIAGNOSIS — D123 Benign neoplasm of transverse colon: Secondary | ICD-10-CM | POA: Insufficient documentation

## 2019-01-16 DIAGNOSIS — K921 Melena: Secondary | ICD-10-CM | POA: Insufficient documentation

## 2019-01-16 DIAGNOSIS — G4733 Obstructive sleep apnea (adult) (pediatric): Secondary | ICD-10-CM | POA: Insufficient documentation

## 2019-01-16 DIAGNOSIS — Z79899 Other long term (current) drug therapy: Secondary | ICD-10-CM | POA: Diagnosis not present

## 2019-01-16 DIAGNOSIS — K5520 Angiodysplasia of colon without hemorrhage: Secondary | ICD-10-CM | POA: Insufficient documentation

## 2019-01-16 DIAGNOSIS — Z8551 Personal history of malignant neoplasm of bladder: Secondary | ICD-10-CM | POA: Insufficient documentation

## 2019-01-16 DIAGNOSIS — K625 Hemorrhage of anus and rectum: Secondary | ICD-10-CM | POA: Diagnosis present

## 2019-01-16 DIAGNOSIS — Z85828 Personal history of other malignant neoplasm of skin: Secondary | ICD-10-CM | POA: Insufficient documentation

## 2019-01-16 DIAGNOSIS — Z79890 Hormone replacement therapy (postmenopausal): Secondary | ICD-10-CM | POA: Diagnosis not present

## 2019-01-16 DIAGNOSIS — I480 Paroxysmal atrial fibrillation: Secondary | ICD-10-CM | POA: Insufficient documentation

## 2019-01-16 DIAGNOSIS — Z7901 Long term (current) use of anticoagulants: Secondary | ICD-10-CM | POA: Diagnosis not present

## 2019-01-16 DIAGNOSIS — K627 Radiation proctitis: Secondary | ICD-10-CM | POA: Diagnosis not present

## 2019-01-16 DIAGNOSIS — I10 Essential (primary) hypertension: Secondary | ICD-10-CM | POA: Insufficient documentation

## 2019-01-16 DIAGNOSIS — Z7982 Long term (current) use of aspirin: Secondary | ICD-10-CM | POA: Diagnosis not present

## 2019-01-16 DIAGNOSIS — Z8546 Personal history of malignant neoplasm of prostate: Secondary | ICD-10-CM | POA: Diagnosis not present

## 2019-01-16 DIAGNOSIS — I2510 Atherosclerotic heart disease of native coronary artery without angina pectoris: Secondary | ICD-10-CM | POA: Diagnosis not present

## 2019-01-16 DIAGNOSIS — I252 Old myocardial infarction: Secondary | ICD-10-CM | POA: Diagnosis not present

## 2019-01-16 DIAGNOSIS — Z1211 Encounter for screening for malignant neoplasm of colon: Secondary | ICD-10-CM | POA: Diagnosis not present

## 2019-01-16 HISTORY — PX: COLONOSCOPY WITH PROPOFOL: SHX5780

## 2019-01-16 HISTORY — PX: HOT HEMOSTASIS: SHX5433

## 2019-01-16 HISTORY — PX: POLYPECTOMY: SHX5525

## 2019-01-16 SURGERY — COLONOSCOPY WITH PROPOFOL
Anesthesia: Monitor Anesthesia Care

## 2019-01-16 MED ORDER — LACTATED RINGERS IV SOLN
INTRAVENOUS | Status: DC
  Administered 2019-01-16: 1000 mL via INTRAVENOUS

## 2019-01-16 MED ORDER — SODIUM CHLORIDE 0.9 % IV SOLN: INTRAVENOUS | Status: DC

## 2019-01-16 MED ORDER — PROPOFOL 10 MG/ML IV BOLUS
INTRAVENOUS | Status: DC | PRN
  Administered 2019-01-16: 20 mg via INTRAVENOUS

## 2019-01-16 MED ORDER — PROPOFOL 500 MG/50ML IV EMUL
INTRAVENOUS | Status: AC
  Filled 2019-01-16: qty 50

## 2019-01-16 MED ORDER — PROPOFOL 500 MG/50ML IV EMUL
INTRAVENOUS | Status: DC | PRN
  Administered 2019-01-16: 135 ug/kg/min via INTRAVENOUS

## 2019-01-16 SURGICAL SUPPLY — 21 items

## 2019-01-16 NOTE — Anesthesia Postprocedure Evaluation (Signed)
Anesthesia Post Note  Patient: Charles Wright  Procedure(s) Performed: COLONOSCOPY WITH PROPOFOL (N/A ) HOT HEMOSTASIS (ARGON PLASMA COAGULATION/BICAP) (N/A ) POLYPECTOMY     Patient location during evaluation: Endoscopy Anesthesia Type: MAC Level of consciousness: awake and alert Pain management: pain level controlled Vital Signs Assessment: post-procedure vital signs reviewed and stable Respiratory status: spontaneous breathing, nonlabored ventilation and respiratory function stable Cardiovascular status: blood pressure returned to baseline and stable Postop Assessment: no apparent nausea or vomiting Anesthetic complications: no    Last Vitals:  Vitals:   01/16/19 0930 01/16/19 0940  BP: 112/65 117/72  Pulse: 65 62  Resp: 18 (!) 24  Temp:    SpO2: 95% 94%    Last Pain:  Vitals:   01/16/19 0940  TempSrc:   PainSc: 0-No pain                 Lidia Collum

## 2019-01-16 NOTE — Progress Notes (Signed)
Charles Wright 8:46 AM  Subjective: Patient's office computer chart was reviewed and he recently saw our PA and he has periodic bright red blood per rectum and his last colonoscopy was over 10 years ago and he did have radiation for his prostate 2 years ago and his bowel habits are normal and his family history is negative from a GI standpoint and he is on Eliquis but no other aspirin or blood thinners  Objective: Vital signs stable afebrile exam please see preassessment evaluation  Assessment: Bright red blood per rectum  Plan: Okay to proceed with colonoscopy and we discussed possible laser therapy on his radiation with anesthesia assistance  [facility]  office [phone] After 5PM or if no answer call [phone]

## 2019-01-16 NOTE — Anesthesia Preprocedure Evaluation (Addendum)
Anesthesia Evaluation  Patient identified by MRN, date of birth, ID band Patient awake    Reviewed: Allergy & Precautions, NPO status , Patient's Chart, lab work & pertinent test results  History of Anesthesia Complications Negative for: history of anesthetic complications  Airway Mallampati: I  TM Distance: >3 FB Neck ROM: Full    Dental  (+) Teeth Intact   Pulmonary sleep apnea and Continuous Positive Airway Pressure Ventilation ,    Pulmonary exam normal        Cardiovascular hypertension, + CAD and + Past MI (2016)  Normal cardiovascular exam+ dysrhythmias Atrial Fibrillation      Neuro/Psych negative neurological ROS  negative psych ROS   GI/Hepatic GERD  ,Gilbert syndrome   Endo/Other  negative endocrine ROS  Renal/GU negative Renal ROS  negative genitourinary   Musculoskeletal negative musculoskeletal ROS (+)   Abdominal   Peds  Hematology negative hematology ROS (+)   Anesthesia Other Findings 2018 Echo: EF 55%, mild AI  Reproductive/Obstetrics                           Anesthesia Physical Anesthesia Plan  ASA: III  Anesthesia Plan: MAC   Post-op Pain Management:    Induction: Intravenous  PONV Risk Score and Plan: Propofol infusion, TIVA and Treatment may vary due to age or medical condition  Airway Management Planned: Natural Airway, Nasal Cannula and Simple Face Mask  Additional Equipment: None  Intra-op Plan:   Post-operative Plan:   Informed Consent: I have reviewed the patients History and Physical, chart, labs and discussed the procedure including the risks, benefits and alternatives for the proposed anesthesia with the patient or authorized representative who has indicated his/her understanding and acceptance.     Dental advisory given  Plan Discussed with:   Anesthesia Plan Comments:        Anesthesia Quick Evaluation

## 2019-01-16 NOTE — Op Note (Signed)
[facility] Patient Name: Charles Wright Procedure Date: 01/16/2019 MRN: [id number] Attending MD: Clarene Essex , MD Date of Birth: [birth year] CSN: [id number] Age: 83 Admit Type: Outpatient Procedure:                Colonoscopy Indications:              Screening for colorectal malignant neoplasm, Last                            colonoscopy greater than 10 years ago, Incidental -                            Hematochezia Providers:                Clarene Essex, MD, Elmer Ramp. Hinson, RN, Cletis Athens,                            Technician Referring MD:              Medicines:                Propofol total dose [id number] mg IV Complications:            No immediate complications. Estimated Blood Loss:     Estimated blood loss: none. Procedure:                Pre-Anesthesia Assessment:                           - Prior to the procedure, a History and Physical                            was performed, and patient medications and                            allergies were reviewed. The patient's tolerance of                            previous anesthesia was also reviewed. The risks                            and benefits of the procedure and the sedation                            options and risks were discussed with the patient.                            All questions were answered, and informed consent                            was obtained. Prior Anticoagulants: The patient has                            taken Eliquis (apixaban), last dose was 2 days  prior to procedure. ASA Grade Assessment: III - A                            patient with severe systemic disease. After                            reviewing the risks and benefits, the patient was                            deemed in satisfactory condition to undergo the                            procedure.                           After obtaining informed consent, the colonoscope                             was passed under direct vision. Throughout the                            procedure, the patient's blood pressure, pulse, and                            oxygen saturations were monitored continuously. The                            [id number]) Olympus colonoscope was                            introduced through the anus and advanced to the the                            cecum, identified by appendiceal orifice and                            ileocecal valve. The ileocecal valve, appendiceal                            orifice, and rectum were photographed. The                            colonoscopy was performed without difficulty. The                            patient tolerated the procedure well. The quality                            of the bowel preparation was adequate. Scope In: 8:52:36 AM Scope Out: 9:14:24 AM Scope Withdrawal Time: 0 hours 17 minutes 54 seconds  Total Procedure Duration: 0 hours 21 minutes 48 seconds  Findings:      Multiple medium-sized localized angiodysplastic lesions without bleeding       compatible with radiation proctitis were found in the rectum.  Fulguration to ablate the lesion to prevent bleeding by argon plasma at       1 liter/minute and 20 watts was successful.      Three semi-sessile polyps were found in the transverse colon. The polyps       were small in size. These were biopsied with a cold forceps for       histology.      The exam was otherwise without abnormality. Impression:               - Multiple non-bleeding colonic angiodysplastic                            lesions compatible with radiation proctitis.                            Treated with argon plasma coagulation (APC).                           - Three small polyps in the transverse colon.                            Biopsied.                           - The examination was otherwise normal. Moderate Sedation:      Not Applicable - Patient had care per  Anesthesia. Recommendation:           - Patient has a contact number available for                            emergencies. The signs and symptoms of potential                            delayed complications were discussed with the                            patient. Return to normal activities tomorrow.                            Written discharge instructions were provided to the                            patient.                           - Soft diet today.                           - Continue present medications.                           - Resume Eliquis (apixaban) at prior dose tomorrow.                           - Await pathology results.                           -  Repeat colonoscopy PRN for surveillance based on                            pathology results.                           - Return to GI office in 4 weeks. Retreat rectum                            with sigmoidoscopy only as needed and consider                            Canasa suppositories or HC suppositories as needed                            as well                           - Telephone GI clinic for pathology results in 1                            week.                           - Telephone GI clinic if symptomatic PRN. Procedure Code(s):        --- Professional ---                           [phone], 59, Colonoscopy, flexible; with control of                            bleeding, any method                           45380, Colonoscopy, flexible; with biopsy, single                            or multiple Diagnosis Code(s):        --- Professional ---                           Z12.11, Encounter for screening for malignant                            neoplasm of colon                           K55.20, Angiodysplasia of colon without hemorrhage                           K63.5, Polyp of colon CPT copyright 2019 American Medical Association. All rights reserved. The codes documented in this report are preliminary and  upon coder review may  be revised to meet current compliance requirements. Clarene Essex, MD 01/16/2019 9:26:05 AM This report has been signed electronically. Number of Addenda: 0

## 2019-01-16 NOTE — Discharge Instructions (Signed)
YOU HAD AN ENDOSCOPIC PROCEDURE TODAY: Refer to the procedure report and other information in the discharge instructions given to you for any specific questions about what was found during the examination. If this information does not answer your questions, please call [facility] office at [phone] to clarify.   YOU SHOULD EXPECT: Some feelings of bloating in the abdomen. Passage of more gas than usual. Walking can help get rid of the air that was put into your GI tract during the procedure and reduce the bloating. If you had a lower endoscopy (such as a colonoscopy or flexible sigmoidoscopy) you may notice spotting of blood in your stool or on the toilet paper. Some abdominal soreness may be present for a day or two, also.  DIET: Your first meal following the procedure should be a light meal and then it is ok to progress to your normal diet. A half-sandwich or bowl of soup is an example of a good first meal. Heavy or fried foods are harder to digest and may make you feel nauseous or bloated. Drink plenty of fluids but you should avoid alcoholic beverages for 24 hours. If you had a esophageal dilation, please see attached instructions for diet.   ACTIVITY: Your care partner should take you home directly after the procedure. You should plan to take it easy, moving slowly for the rest of the day. You can resume normal activity the day after the procedure however YOU SHOULD NOT DRIVE, use power tools, machinery or perform tasks that involve climbing or major physical exertion for 24 hours (because of the sedation medicines used during the test).   SYMPTOMS TO REPORT IMMEDIATELY: A gastroenterologist can be reached at any hour. Please call [phone]  for any of the following symptoms:   Following lower endoscopy (colonoscopy, flexible sigmoidoscopy) Excessive amounts of blood in the stool  Significant tenderness, worsening of abdominal pains  Swelling of the abdomen that is new, acute  Fever of 100  or higher   Following upper endoscopy (EGD, EUS, ERCP, esophageal dilation) Vomiting of blood or coffee ground material  New, significant abdominal pain  New, significant chest pain or pain under the shoulder blades  Painful or persistently difficult swallowing  New shortness of breath  Black, tarry-looking or red, bloody stools  FOLLOW UP:  If any biopsies were taken you will be contacted by phone or by letter within the next 1-3 weeks. Call [phone]  if you have not heard about the biopsies in 3 weeks.  Please also call with any specific questions about appointments or follow up tests. Begin with soft solids call if question or problems otherwise call for biopsy report in 1 week and follow-up in 1 month and if doing well tomorrow resume your blood thinner

## 2019-01-16 NOTE — Transfer of Care (Signed)
Immediate Anesthesia Transfer of Care Note  Patient: Charles Wright  Procedure(s) Performed: COLONOSCOPY WITH PROPOFOL (N/A )  Patient Location: PACU  Anesthesia Type:MAC  Level of Consciousness: awake  Airway & Oxygen Therapy: Patient Spontanous Breathing and Patient connected to face mask oxygen  Post-op Assessment: Report given to RN, Post -op Vital signs reviewed and stable and Patient moving all extremities X 4  Post vital signs: Reviewed and stable  Last Vitals:  Vitals Value Taken Time  BP    Temp    Pulse 68 01/16/19 0917  Resp 13 01/16/19 0917  SpO2 93 % 01/16/19 0917  Vitals shown include unvalidated device data.  Last Pain:  Vitals:   01/16/19 0735  TempSrc: Oral  PainSc: 0-No pain         Complications: No apparent anesthesia complications

## 2019-01-16 NOTE — Anesthesia Procedure Notes (Signed)
Procedure Name: MAC Date/Time: 01/16/2019 8:42 AM Performed by: Niel Hummer, CRNA Pre-anesthesia Checklist: Patient identified, Emergency Drugs available, Suction available and Patient being monitored Patient Re-evaluated:Patient Re-evaluated prior to induction Oxygen Delivery Method: Simple face mask

## 2019-01-17 LAB — SURGICAL PATHOLOGY

## 2019-01-19 ENCOUNTER — Encounter: Payer: Self-pay | Admitting: *Deleted

## 2019-01-22 DIAGNOSIS — L905 Scar conditions and fibrosis of skin: Secondary | ICD-10-CM | POA: Diagnosis not present

## 2019-01-22 DIAGNOSIS — C44319 Basal cell carcinoma of skin of other parts of face: Secondary | ICD-10-CM | POA: Diagnosis not present

## 2019-01-22 DIAGNOSIS — C44329 Squamous cell carcinoma of skin of other parts of face: Secondary | ICD-10-CM | POA: Diagnosis not present

## 2019-01-22 DIAGNOSIS — D485 Neoplasm of uncertain behavior of skin: Secondary | ICD-10-CM | POA: Diagnosis not present

## 2019-01-22 DIAGNOSIS — Z85828 Personal history of other malignant neoplasm of skin: Secondary | ICD-10-CM | POA: Diagnosis not present

## 2019-01-25 DIAGNOSIS — C44319 Basal cell carcinoma of skin of other parts of face: Secondary | ICD-10-CM | POA: Diagnosis not present

## 2019-02-06 DIAGNOSIS — T63451D Toxic effect of venom of hornets, accidental (unintentional), subsequent encounter: Secondary | ICD-10-CM | POA: Diagnosis not present

## 2019-02-06 DIAGNOSIS — T63461D Toxic effect of venom of wasps, accidental (unintentional), subsequent encounter: Secondary | ICD-10-CM | POA: Diagnosis not present

## 2019-03-05 DIAGNOSIS — C44319 Basal cell carcinoma of skin of other parts of face: Secondary | ICD-10-CM | POA: Diagnosis not present

## 2019-03-14 DIAGNOSIS — T63451D Toxic effect of venom of hornets, accidental (unintentional), subsequent encounter: Secondary | ICD-10-CM | POA: Diagnosis not present

## 2019-03-14 DIAGNOSIS — T63461D Toxic effect of venom of wasps, accidental (unintentional), subsequent encounter: Secondary | ICD-10-CM | POA: Diagnosis not present

## 2019-03-18 ENCOUNTER — Other Ambulatory Visit: Payer: Self-pay

## 2019-03-18 ENCOUNTER — Telehealth: Payer: Self-pay | Admitting: Urology

## 2019-03-18 ENCOUNTER — Emergency Department (HOSPITAL_COMMUNITY)
Admission: EM | Admit: 2019-03-18 | Discharge: 2019-03-18 | Disposition: A | Discharge status: Home / Self Care | Payer: PPO | Attending: Emergency Medicine | Admitting: Emergency Medicine

## 2019-03-18 ENCOUNTER — Encounter (HOSPITAL_COMMUNITY): Payer: Self-pay

## 2019-03-18 ENCOUNTER — Emergency Department (HOSPITAL_COMMUNITY): Payer: PPO

## 2019-03-18 DIAGNOSIS — N1330 Unspecified hydronephrosis: Secondary | ICD-10-CM | POA: Diagnosis not present

## 2019-03-18 DIAGNOSIS — I119 Hypertensive heart disease without heart failure: Secondary | ICD-10-CM | POA: Insufficient documentation

## 2019-03-18 DIAGNOSIS — R1032 Left lower quadrant pain: Secondary | ICD-10-CM | POA: Diagnosis not present

## 2019-03-18 DIAGNOSIS — Z7982 Long term (current) use of aspirin: Secondary | ICD-10-CM | POA: Diagnosis not present

## 2019-03-18 DIAGNOSIS — R109 Unspecified abdominal pain: Secondary | ICD-10-CM

## 2019-03-18 DIAGNOSIS — R319 Hematuria, unspecified: Secondary | ICD-10-CM | POA: Diagnosis not present

## 2019-03-18 DIAGNOSIS — Z79899 Other long term (current) drug therapy: Secondary | ICD-10-CM | POA: Insufficient documentation

## 2019-03-18 DIAGNOSIS — R911 Solitary pulmonary nodule: Secondary | ICD-10-CM | POA: Insufficient documentation

## 2019-03-18 DIAGNOSIS — Z8551 Personal history of malignant neoplasm of bladder: Secondary | ICD-10-CM | POA: Diagnosis not present

## 2019-03-18 DIAGNOSIS — Z7901 Long term (current) use of anticoagulants: Secondary | ICD-10-CM | POA: Insufficient documentation

## 2019-03-18 LAB — URINALYSIS, ROUTINE W REFLEX MICROSCOPIC
Bacteria, UA: NONE SEEN
Bilirubin Urine: NEGATIVE
Glucose, UA: NEGATIVE mg/dL
Ketones, ur: 20 mg/dL — AB
Leukocytes,Ua: NEGATIVE
Nitrite: POSITIVE — AB
Protein, ur: 100 mg/dL — AB
Specific Gravity, Urine: 1.014 (ref 1.005–1.030)
pH: 6 (ref 5.0–8.0)

## 2019-03-18 LAB — TYPE AND SCREEN
ABO/RH(D): B POS
Antibody Screen: NEGATIVE

## 2019-03-18 LAB — COMPREHENSIVE METABOLIC PANEL
ALT: 20 U/L (ref 0–44)
AST: 26 U/L (ref 15–41)
Albumin: 4.1 g/dL (ref 3.5–5.0)
Alkaline Phosphatase: 63 U/L (ref 38–126)
Anion gap: 10 (ref 5–15)
BUN: 18 mg/dL (ref 8–23)
CO2: 24 mmol/L (ref 22–32)
Calcium: 9.4 mg/dL (ref 8.9–10.3)
Chloride: 105 mmol/L (ref 98–111)
Creatinine, Ser: 1.31 mg/dL — ABNORMAL HIGH (ref 0.61–1.24)
GFR calc Af Amer: 59 mL/min — ABNORMAL LOW (ref >60)
GFR calc non Af Amer: 51 mL/min — ABNORMAL LOW (ref >60)
Glucose, Bld: 118 mg/dL — ABNORMAL HIGH (ref 70–99)
Potassium: 4.2 mmol/L (ref 3.5–5.1)
Sodium: 139 mmol/L (ref 135–145)
Total Bilirubin: 1.7 mg/dL — ABNORMAL HIGH (ref 0.3–1.2)
Total Protein: 7.2 g/dL (ref 6.5–8.1)

## 2019-03-18 LAB — CBC WITH DIFFERENTIAL/PLATELET
Abs Immature Granulocytes: 0.07 10*3/uL (ref 0.00–0.07)
Basophils Absolute: 0.1 10*3/uL (ref 0.0–0.1)
Basophils Relative: 1 %
Eosinophils Absolute: 0.3 10*3/uL (ref 0.0–0.5)
Eosinophils Relative: 2 %
HCT: 52.3 % — ABNORMAL HIGH (ref 39.0–52.0)
Hemoglobin: 17.1 g/dL — ABNORMAL HIGH (ref 13.0–17.0)
Immature Granulocytes: 1 %
Lymphocytes Relative: 11 %
Lymphs Abs: 1.2 10*3/uL (ref 0.7–4.0)
MCH: 30 pg (ref 26.0–34.0)
MCHC: 32.7 g/dL (ref 30.0–36.0)
MCV: 91.8 fL (ref 80.0–100.0)
Monocytes Absolute: 1 10*3/uL (ref 0.1–1.0)
Monocytes Relative: 9 %
Neutro Abs: 8.8 10*3/uL — ABNORMAL HIGH (ref 1.7–7.7)
Neutrophils Relative %: 76 %
Platelets: 318 10*3/uL (ref 150–400)
RBC: 5.7 MIL/uL (ref 4.22–5.81)
RDW: 13.9 % (ref 11.5–15.5)
WBC: 11.4 10*3/uL — ABNORMAL HIGH (ref 4.0–10.5)
nRBC: 0 % (ref 0.0–0.2)

## 2019-03-18 LAB — PROTIME-INR
INR: 1.1 (ref 0.8–1.2)
Prothrombin Time: 14.4 seconds (ref 11.4–15.2)

## 2019-03-18 MED ORDER — MORPHINE SULFATE (PF) 4 MG/ML IV SOLN
4.0000 mg | Freq: Once | INTRAVENOUS | Status: AC
  Administered 2019-03-18: 4 mg via INTRAVENOUS
  Filled 2019-03-18: qty 1

## 2019-03-18 MED ORDER — HYDROCODONE-ACETAMINOPHEN 5-325 MG PO TABS: 1.0000 | ORAL_TABLET | Freq: Four times a day (QID) | ORAL | 0 refills | Status: DC | PRN

## 2019-03-18 NOTE — Discharge Instructions (Addendum)
Please return for any problem.  Follow-up with Dr. Junious Silk of urology as instructed.  If you experience increased abdominal pain, difficulty urinating, or increased blood and clot in your urine please return to the ED for reevaluation.  An incidental finding on your CT scan today demonstrated a 64mm posterior lingular lung nodule.  This should be reimaged with a noncontrast chest CT in 6 to 12 months.

## 2019-03-18 NOTE — ED Triage Notes (Signed)
Pt presents with c/o left side flank pain. Pt also c/o hematuria, hematuria noted whenever he was urinating.

## 2019-03-18 NOTE — ED Provider Notes (Signed)
[facility] DEPT Provider Note   CSN: [id number] Arrival date & time: 03/18/19  [id number]     History Chief Complaint  Patient presents with  . Flank Pain    Charles Wright is a 84 y.o. male.  84 year old male with prior medical history as detailed below presents for evaluation of left flank pain and associated hematuria.  Patient with longstanding history of hematuria.  He is well-known to urology for this specific complaint.  He presents today complaining of increased pain to the left flank.  He did contact Dr. Louis Wright prior to arrival today.  He denies associated fever, nausea, vomiting, chest pain, change in bowel movements, or other specific complaint.  He reports chronic issues with hematuria and dribbling when attempting to urinate.  The history is provided by the patient and medical records.  Flank Pain This is a recurrent problem. The current episode started 3 to 5 hours ago. The problem occurs constantly. The problem has not changed since onset.Pertinent negatives include no chest pain and no abdominal pain. Nothing aggravates the symptoms. Nothing relieves the symptoms.       Past Medical History:  Diagnosis Date  . Acute gout of left ankle    06-14-2015  . Bladder cancer (HCC)    BCG's tx's  . BPH (benign prostatic hypertrophy)   . CAD (coronary artery disease) CARDIOLOGIST-  DR TILLEY   a. NSTEMI 12/2014 -  99% dLAD-2 (not amenable to PCI), 50% dLAD-1, 60% mLAD. Medical therapy was recommended.  . Cough    HARSH NONPRODUCTIVE COUGH 1 AND 1/2 WEEKS  . GERD (gastroesophageal reflux disease)    takes  OTC periodically  . Gilbert's syndrome   . H/O cardiac catheterization    a. Note: Difficult radial access in 12/2014 - recommend femoral approach if cath needed in the future.  Marland Kitchen History of kidney stones   . History of non-ST elevation myocardial infarction (NSTEMI)    12-12-2014   CARDIAC CATH W/ NO INTERVENTION X 2FEW DAYS APART  . History  of spinal fracture 2002   LUMBAR  . Hyperlipidemia   . Hypertension   . OSA on CPAP    SET ON 7 USES SOME NIGHTS  . Paroxysmal A-fib [facility])    cardiologist-  dr Charles Wright  . Prostate cancer ([place]) 2019   last PSA  2.9  (montiored by urologist  dr Charles Wright) [place] FEB 2019 RAD DONE  . Shingles    2 weeks ago  . Wears glasses     Patient Active Problem List   Diagnosis Date Noted  . Hyperlipidemia 12/14/2014  . CAD (coronary artery disease), native coronary artery   . Paroxysmal atrial fibrillation ([place]) 08/13/2014  . Long-term (current) use of anticoagulants 08/13/2014  . Obesity (BMI 30-39.9) 08/13/2014  . Hypertensive heart disease   . Sleep apnea   . Bladder cancer ([place])   . Prostate cancer ([name])   . Lumbar stenosis with neurogenic claudication 12/03/2013  . Upper airway cough syndrome 01/27/2013    Past Surgical History:  Procedure Laterality Date  . BACK SURGERY  2014   LOWER l 1, 2 4 AND 5  . BILATERAL INGUINAL HERNIA REPAIR    . CARDIAC CATHETERIZATION N/A 12/13/2014   Procedure: Left Heart Cath and Coronary Angiography;  Surgeon: Charles Man, MD;  Location: [facility];  Service: Cardiovascular;  Laterality: N/A;  culprit lesion diat LAD-2  99%, not approachable via PCTA  due to extremely tortuous up stream  LAD/  mLAD 60% and dLAD-1 50%, both are at the extremely tortuous segment and not PCI targets/  otherwise normal coronary arteries  . COLONOSCOPY WITH PROPOFOL N/A 01/16/2019   Procedure: COLONOSCOPY WITH PROPOFOL;  Surgeon: Clarene Essex, MD;  Location: [facility];  Service: Endoscopy;  Laterality: N/A;  . CYSTOSCOPY W/ RETROGRADES Left 08/22/2012   Procedure: CYSTOSCOPY WITH RETROGRADE PYELOGRAM;  left kidney washings;  Surgeon: Charles Bonine, MD;  Location: [facility];  Service: Urology;  Laterality: Left;  . CYSTOSCOPY W/ RETROGRADES N/A 07/08/2015   Procedure: CYSTOSCOPY WITH RETROGRADE PYELOGRAM;  Surgeon: Charles Aloe, MD;   Location: [facility];  Service: Urology;  Laterality: N/A;  . CYSTOSCOPY WITH BIOPSY  08/22/2012   Procedure: CYSTOSCOPY WITH BIOPSY;  Surgeon: Charles Bonine, MD;  Location: [facility];  Service: Urology;;  . Charles Wright WITH BIOPSY N/A 11/08/2014   Procedure: CYSTOSCOPY/BIOPSPY BLADDER INSTILLATION OF MARCAINE AND PYRIDIUM;  Surgeon: Charles Aloe, MD;  Location: [facility];  Service: Urology;  Laterality: N/A;  . CYSTOSCOPY WITH BIOPSY N/A 07/08/2015   Procedure: CYSTOSCOPY WITH BLADDER BIOPSY, FULGURATION, RETROGRADE PYLOGRAM AND RENAL WASHING;  Surgeon: Charles Aloe, MD;  Location: [facility];  Service: Urology;  Laterality: N/A;  . CYSTOSCOPY WITH RETROGRADE PYELOGRAM, URETEROSCOPY AND STENT PLACEMENT Bilateral 07/30/2014   Procedure: CYSTOSCOPY WITH BLADDER BX FULERGATION AND BILATERAL RETROGRADE PYELOGRAM,;  Surgeon: Charles Aloe, MD;  Location: [facility];  Service: Urology;  Laterality: Bilateral;  . CYSTOSCOPY WITH STENT PLACEMENT Left 03/24/2018   Procedure: STENT PLACEMENT AND BIOPSY;  Surgeon: Charles Aloe, MD;  Location: [facility];  Service: Urology;  Laterality: Left;  . CYSTOSCOPY/RETROGRADE/URETEROSCOPY Bilateral 08/17/2013   Procedure: CYSTOSCOPY, BLADDER BIOPSY WITH BILATERAL RETROGRADE PYELOGRAM, LEFT URETEROSCOPY AND DILATION OF STRICTURE ;  Surgeon: Charles Aloe, MD;  Location[facility];  Service: Urology;  Laterality: Bilateral;  . CYSTOSCOPY/RETROGRADE/URETEROSCOPY Left 03/24/2018   Procedure: CYSTOSCOPY/RETROGRADE/URETEROSCOPY;  Surgeon: Charles Aloe, MD;  Location: [facility];  Service: Urology;  Laterality: Left;  . HOT HEMOSTASIS N/A 01/16/2019   Procedure: HOT HEMOSTASIS (ARGON PLASMA COAGULATION/BICAP);  Surgeon: Clarene Essex, MD;  Location: [facility];  Service: Endoscopy;  Laterality: N/A;  . LEFT ATRIAL APPENDAGE OCCLUSION N/A  01/09/2015   Procedure: LEFT ATRIAL APPENDAGE OCCLUSION;  Surgeon: Charles Grayer, MD;  Location: [facility];  Service: Cardiovascular;  Laterality: N/A;  . LUMBAR LAMINECTOMY/DECOMPRESSION MICRODISCECTOMY Left 12/03/2013   Procedure: Left Lumbar three-four diskectomy with Lumbar four laminectomy ;  Surgeon: Newman Pies, MD;  Location: [facility];  Service: Neurosurgery;  Laterality: Left;  Left Lumbar three-four diskectomy with Lumbar four laminectomy   . ORIF LEFT ANKLE FX  2005  . POLYPECTOMY  01/16/2019   Procedure: POLYPECTOMY;  Surgeon: Clarene Essex, MD;  [facility];  Service: Endoscopy;;  . PROSTATE BIOPSY N/A 08/22/2012   Procedure: BIOPSY TRANSRECTAL ULTRASONIC PROSTATE (TUBP);  Surgeon: Charles Bonine, MD;  Location: [facility];  Service: Urology;  Laterality: N/A;  . TEE WITHOUT CARDIOVERSION N/A 12/31/2014   Procedure: TRANSESOPHAGEAL ECHOCARDIOGRAM (TEE);  Surgeon: Pixie Casino, MD;  Location: [facility];  Service: Cardiovascular;  Laterality: N/A;  ef 55-60%/  mild MR, TR, and PR/  mild LAE and RAE  . TRANSURETHRAL RESECTION OF BLADDER TUMOR  10/22/2011   Procedure: TRANSURETHRAL RESECTION OF BLADDER TUMOR (TURBT);  Surgeon: Charles Bonine, MD;  Location: [facility];  Service: Urology;  Laterality:  N/A;  TURBT, LEFT URETEROSCOPY, POSSIBLE URETERAL STENT   . URETEROSCOPY  10/22/2011   Procedure: URETEROSCOPY;  Surgeon: Charles Bonine, MD;  Location: [facility];  Service: Urology;  Laterality: Left;  . WISDOM TOOTH EXTRACTION         Family History  Problem Relation Age of Onset  . Leukemia Mother   . Cancer Mother        leukemia  . Heart attack Father   . Cancer Sister        breast  . Hypertension Neg Hx   . Stroke Neg Hx     Social History   Tobacco Use  . Smoking status: Never Smoker  . Smokeless tobacco: Never Used  Substance Use Topics  . Alcohol use: Yes     Alcohol/week: 7.0 standard drinks    Types: 7 Glasses of wine per week    Comment: red wine glass daily  . Drug use: No    Home Medications Prior to Admission medications   Medication Sig Start Date End Date Taking? Authorizing Provider  allopurinol (ZYLOPRIM) 300 MG tablet Take 300 mg by mouth every morning.     [provider]  apixaban (ELIQUIS) 5 MG TABS tablet Take 1 tablet (5 mg total) by mouth 2 (two) times daily. 10/10/18   Pixie Casino, MD  aspirin EC 81 MG tablet Take 81 mg by mouth daily.    [provider]  Cholecalciferol (VITAMIN D-3) 1000 UNITS CAPS Take 1,000 Units by mouth 3 (three) times daily.     [provider]  EPINEPHrine 0.3 mg/0.3 mL IJ SOAJ injection Inject 0.3 mLs (0.3 mg total) into the muscle once. 08/18/14   Piepenbrink, Anderson Malta, PA-C  Glucosamine Sulfate (DONA PO) Take 750 mg by mouth daily.     [provider]  isosorbide mononitrate (IMDUR) 30 MG 24 hr tablet Take 1 tablet (30 mg total) by mouth daily. Schedule appointment before running out 02/27/18   Park Liter, MD  levothyroxine (SYNTHROID, LEVOTHROID) 50 MCG tablet Take 50 mcg by mouth daily before breakfast.    [provider]  metoprolol (LOPRESSOR) 50 MG tablet Take 50 mg by mouth 2 (two) times daily.    [provider]  Multiple Vitamin (MULTIVITAMIN) tablet Take 1 tablet by mouth daily.     [provider]  nitroGLYCERIN (NITROSTAT) 0.4 MG SL tablet Place 1 tablet (0.4 mg total) under the tongue every 5 (five) minutes x 3 doses as needed for chest pain. 12/14/14   Jacolyn Reedy, MD  pravastatin (PRAVACHOL) 40 MG tablet Take 1 tablet (40 mg total) by mouth daily. Patient taking differently: Take 40 mg by mouth every morning.  12/14/14   Jacolyn Reedy, MD  Testosterone 1.62 % GEL Place 2 Pump onto the skin every other day.     [provider]    Allergies    Bee venom, Losartan, and Oxycodone  Review of Systems     Review of Systems  Cardiovascular: Negative for chest pain.  Gastrointestinal: Negative for abdominal pain.  Genitourinary: Positive for flank pain.  All other systems reviewed and are negative.   Physical Exam Updated Vital Signs BP (!) 152/75   Pulse 73   Temp 97.8 F (36.6 C) (Oral)   Resp 18   SpO2 97%   Physical Exam Vitals and nursing note reviewed.  Constitutional:      General: He is not in acute distress.    Appearance: He  is well-developed.  HENT:     Head: Normocephalic and atraumatic.  Eyes:     Conjunctiva/sclera: Conjunctivae normal.     Pupils: Pupils are equal, round, and reactive to light.  Cardiovascular:     Rate and Rhythm: Normal rate and regular rhythm.     Heart sounds: Normal heart sounds.  Pulmonary:     Effort: Pulmonary effort is normal. No respiratory distress.     Breath sounds: Normal breath sounds.  Abdominal:     General: There is no distension.     Palpations: Abdomen is soft.     Tenderness: There is no abdominal tenderness.  Genitourinary:    Comments: Mild left CVA tenderness  No appreciable suprapubic tenderness with palpation Musculoskeletal:        General: No deformity. Normal range of motion.     Cervical back: Normal range of motion and neck supple.  Skin:    General: Skin is warm and dry.  Neurological:     Mental Status: He is alert and oriented to person, place, and time. Mental status is at baseline.     ED Results / Procedures / Treatments   Labs (all labs ordered are listed, but only abnormal results are displayed) Labs Reviewed  COMPREHENSIVE METABOLIC PANEL - Abnormal; Notable for the following components:      Result Value   Glucose, Bld 118 (*)    Creatinine, Ser 1.31 (*)    Total Bilirubin 1.7 (*)    GFR calc non Af Amer 51 (*)    GFR calc Af Amer 59 (*)    All other components within normal limits  CBC WITH DIFFERENTIAL/PLATELET - Abnormal; Notable for the following components:   WBC 11.4 (*)     Hemoglobin 17.1 (*)    HCT 52.3 (*)    Neutro Abs 8.8 (*)    All other components within normal limits  PROTIME-INR  URINALYSIS, ROUTINE W REFLEX MICROSCOPIC  TYPE AND SCREEN    EKG None  Radiology CT ABDOMEN PELVIS WO CONTRAST  Result Date: 03/18/2019 CLINICAL DATA:  Left flank pain and hematuria. Kidney stones suspected. EXAM: CT ABDOMEN AND PELVIS WITHOUT CONTRAST TECHNIQUE: Multidetector CT imaging of the abdomen and pelvis was performed following the standard protocol without IV contrast. COMPARISON:  09/05/2018 FINDINGS: Lower chest: Similar appearance of peripheral chronic interstitial changes in the lower lungs, potentially representing underlying fibrotic disease.7 mm posterior lingular nodule was not included on the previous exam and is new since the CT of 02/21/2018. Hepatobiliary: No focal abnormality in the liver on this study without intravenous contrast. There is no evidence for gallstones, gallbladder wall thickening, or pericholecystic fluid. No intrahepatic or extrahepatic biliary dilation. Pancreas: No focal mass lesion. No dilatation of the main duct. No intraparenchymal cyst. No peripancreatic edema. Spleen: No splenomegaly. No focal mass lesion. Adrenals/Urinary Tract: No adrenal nodule or mass. Punctate 1 mm stone identified upper pole right kidney. No right ureteral stone. No secondary changes in the right kidney or ureter. There is left sided hydroureteronephrosis with high attenuation material in the left renal pelvis. No calcified stone disease. 5.9 x 3.6 cm filling defect noted posterior bladder. This is likely blood clot given changes in the left kidney although neoplasm not excluded. Stomach/Bowel: Stomach is unremarkable. No gastric wall thickening. No evidence of outlet obstruction. Duodenum is normally positioned as is the ligament of Treitz. No small bowel wall thickening. No small bowel dilatation. The terminal ileum is normal. The appendix is normal. No gross colonic  mass. No colonic wall thickening. Vascular/Lymphatic: There is abdominal aortic atherosclerosis without aneurysm. There is no gastrohepatic or hepatoduodenal ligament lymphadenopathy. No retroperitoneal or mesenteric lymphadenopathy. No pelvic sidewall lymphadenopathy. Reproductive: Fiducial markers noted in the prostate gland. Other: No intraperitoneal free fluid. Musculoskeletal: No worrisome lytic or sclerotic osseous abnormality. Mild superior endplate compression deformity noted at L1. IMPRESSION: 1. Left-sided hydroureteronephrosis with high attenuation material in the left renal pelvis tracking towards the left upper pole calices. While blood products/clot could have this appearance, urothelial neoplasm is a distinct concern. 2. 5.9 x 3.6 cm filling defect in the posterior bladder lumen, likely blood clot given changes in the left kidney. Underlying neoplasm not excluded. 3. 1 mm nonobstructing stone upper pole right kidney. 4. Similar appearance of peripheral chronic interstitial changes in the lower lungs, likely representing underlying fibrotic disease. 5. 7 mm posterior lingular nodule not included on the previous exam and new since 02/21/2018. Non-contrast chest CT at 6-12 months is recommended. If the nodule is stable at time of repeat CT, then future CT at 18-24 months (from today's scan) is considered optional for low-risk patients, but is recommended for high-risk patients. This recommendation follows the consensus statement: Guidelines for Management of Incidental Pulmonary Nodules Detected on CT Images: From the Fleischner Society 2017; Radiology 2017; 284:228-243. 6. Aortic Atherosclerosis (ICD10-I70.0). Electronically Signed   By: Misty Stanley M.D.   On: 03/18/2019 11:59    Procedures Procedures (including critical care time)  Medications Ordered in ED Medications  morphine 4 MG/ML injection 4 mg (4 mg Intravenous Given 03/18/19 1020)    ED Course  I have reviewed the triage vital  signs and the nursing notes.  Pertinent labs & imaging results that were available during my care of the patient were reviewed by me and considered in my medical decision making (see chart for details).    MDM Rules/Calculators/A&P                      MDM  Screen complete  RORKE PAGLIARULO was evaluated in Emergency Department on 03/18/2019 for the symptoms described in the history of present illness. He was evaluated in the context of the global COVID-19 pandemic, which necessitated consideration that the patient might be at risk for infection with the SARS-CoV-2 virus that causes COVID-19. Institutional protocols and algorithms that pertain to the evaluation of patients at risk for COVID-19 are in a state of rapid change based on information released by regulatory bodies including the CDC and federal and state organizations. These policies and algorithms were followed during the patient's care in the ED.  Patient is presenting with left flank pain and associated hematuria.  Patient's hematuria is a chronic complaint.  His pain appears to be somewhat chronic in nature as well.  We will attempt symptomatic control.  We will also obtain baseline labs and CT imaging to evaluate for other pathology.    I am concerned that the patient may have some degree of urinary retention -- he is currently refusing Foley catheter placement and possible bladder irrigation.    1100 Case discussed with Dr. Louis Wright who agrees with ED plan of care.  We will attempt three-way Foley catheter placement for possible irrigation of the patient's bladder.    1105 Patient is currently declining catheter placement.  He reports that he feels like he should be able to urinate on his own.  1230 CT imaging revealed left-sided hydro nephrosis with layering blood in the bladder.  Patient  feels significantly better following administration of pain medicine.  He was able to produce approximately 400 mL of bloody urine.  He  declined Foley catheter placement.  Case was discussed again with Dr. Louis Wright who agrees with plan to follow-up closely as an outpatient with Dr. Junious Wright.  No indication at this time for acute urologic intervention.  Patient is aware of incidental findings of lung nodule on CT and the need for close follow-up for reimaging in 6 to 12 months.  Strict return precautions given and understood.  Ports close follow-up is stressed.   Final Clinical Impression(s) / ED Diagnoses Final diagnoses:  Flank pain  Hematuria, unspecified type    Rx / DC Orders ED Discharge Orders         Ordered    HYDROcodone-acetaminophen (NORCO/VICODIN) 5-325 MG tablet  Every 6 hours PRN     03/18/19 1229           Valarie Merino, MD 03/18/19 1230

## 2019-03-18 NOTE — Telephone Encounter (Signed)
Patient with history of bladder cancer and left ureteral stricture.  Has been bleeding for the last 2 months off/on.  Recently has been bad.  Now with severe left flank pain and the inability to void.  I suspect he has developed bleeding from his left upper tract and developed clot colic.  He also likely has clot retention.  Recommended that they head to [facility] ED for further evaluation.

## 2019-03-18 NOTE — ED Notes (Signed)
Urine culture sent down to lab with urinalysis. 

## 2019-03-18 NOTE — ED Notes (Signed)
Bladder scan=376 mL

## 2019-03-18 NOTE — ED Notes (Addendum)
Per Francia Greaves, MD hold off on inserting foley catheter. Patient does not want catheter at this time.    Condom catheter place on patient EDP made aware.

## 2019-03-19 DIAGNOSIS — R310 Gross hematuria: Secondary | ICD-10-CM | POA: Diagnosis not present

## 2019-03-19 DIAGNOSIS — R3914 Feeling of incomplete bladder emptying: Secondary | ICD-10-CM | POA: Diagnosis not present

## 2019-03-19 DIAGNOSIS — Q6211 Congenital occlusion of ureteropelvic junction: Secondary | ICD-10-CM | POA: Diagnosis not present

## 2019-03-19 LAB — ABO/RH: ABO/RH(D): B POS

## 2019-03-20 ENCOUNTER — Telehealth: Payer: Self-pay | Admitting: Internal Medicine

## 2019-03-20 NOTE — Telephone Encounter (Addendum)
Patient with diagnosis of afib on Eliquis for anticoagulation.    Procedure: Cystoscopy, Ureteroscopy with stent placement  Date of procedure: TBD  CHADS2-VASc score of  6 (HTN, AGE, stroke/tia x 2, CAD, AGE)  CrCl 49 ml/min  Patient is high risk off anticoagulation due to history of stroke. MD requesting a 3 day hold. Patient was just started on anticoagulation in September. He was deemed to high a bleed risk in past and did undergo watchman procedure which was ultimately unsuccessful. I will defer to Dr. Debara Pickett.

## 2019-03-20 NOTE — Telephone Encounter (Signed)
   [facility] Pre-operative Risk Assessment    Request for surgical clearance:  1. What type of surgery is being performed? Cystoscopy, Ureteroscopy with stent placement   2. When is this surgery scheduled? TBD  3. What type of clearance is required (medical clearance vs. Pharmacy clearance to hold med vs. Both)? Both   4. Are there any medications that need to be held prior to surgery and how long? Asprin 5 days prior & Eliquis 3 days prior    5. Practice name and name of physician performing surgery? [facility], Dr. [name]   6. What is your office phone number [phone]   7.   What is your office fax number [phone]  8.   Anesthesia type (None, local, MAC, general) ? [place] 03/20/2019, 10:04 AM  _________________________________________________________________   (provider comments below)

## 2019-03-22 ENCOUNTER — Other Ambulatory Visit: Payer: Self-pay

## 2019-03-22 ENCOUNTER — Other Ambulatory Visit: Payer: Self-pay | Admitting: Urology

## 2019-03-22 ENCOUNTER — Other Ambulatory Visit (HOSPITAL_COMMUNITY)
Admission: RE | Admit: 2019-03-22 | Discharge: 2019-03-22 | Disposition: A | Discharge status: Home / Self Care | Payer: PPO | Source: Ambulatory Visit | Attending: Urology | Admitting: Urology

## 2019-03-22 ENCOUNTER — Encounter (HOSPITAL_COMMUNITY): Payer: Self-pay | Admitting: Urology

## 2019-03-22 DIAGNOSIS — Z20822 Contact with and (suspected) exposure to covid-19: Secondary | ICD-10-CM | POA: Insufficient documentation

## 2019-03-22 DIAGNOSIS — Z01812 Encounter for preprocedural laboratory examination: Secondary | ICD-10-CM | POA: Diagnosis not present

## 2019-03-22 LAB — SARS CORONAVIRUS 2 (TAT 6-24 HRS): SARS Coronavirus 2: NEGATIVE

## 2019-03-22 NOTE — Progress Notes (Signed)
PCP - Dr. Maury Dus Cardiologist - Deberah Pelton NP-C  Chest x-ray -  EKG - 10-10-18 Stress Test -  ECHO - 05-28-16 Cardiac Cath -   Sleep Study -  CPAP -   Fasting Blood Sugar -  Checks Blood Sugar _____ times a day  Blood Thinner Instructions: Eliquis Hold x 2 days Aspirin Instructions: Last Dose:  Anesthesia review:   Patient denies shortness of breath, fever, cough and chest pain at PAT appointment   Patient verbalized understanding of instructions that were given to them at the PAT appointment. Patient was also instructed that they will need to review over the PAT instructions again at home before surgery.

## 2019-03-22 NOTE — Telephone Encounter (Signed)
Ok to hold Eliquis 2 days prior to procedure and restart after.  Dr Debara Pickett

## 2019-03-22 NOTE — Telephone Encounter (Signed)
   Primary Cardiologist: Pixie Casino, MD  Chart reviewed as part of pre-operative protocol coverage. Given past medical history and time since last visit, based on ACC/AHA guidelines, Charles Wright would be at acceptable risk for the planned procedure without further cardiovascular testing.   He has a class IV risk, 11% risk of major cardiac event.  Procedure: Cystoscopy, Ureteroscopy with stent placement Date of procedure: TBD  CHADS2-VASc score of  6 (HTN, AGE, stroke/tia x 2, CAD, AGE)  CrCl 49 ml/min  He may hold his Eliquis 2 days prior to the procedure and restart after.  I will route this recommendation to the requesting party via Epic fax function and remove from pre-op pool.  Please call with questions.  Jossie Ng. [facility] [place] [address] Office [phone] Fax [phone]

## 2019-03-22 NOTE — Progress Notes (Deleted)
Cardiology Office Note:    Date:  03/22/2019   ID:  Charles Wright, DOB [birth year], MRN [id number]  PCP:  Maury Dus, MD  Cardiologist:  Pixie Casino, MD   Referring MD: Maury Dus, MD   No chief complaint on file. ***  History of Present Illness:    Charles Wright is a 84 y.o. male with a hx of CAD status post non-STEMI in 2016 found to have distal LAD disease not amenable to PCI, moderate mid to proximal LAD disease.  Medical therapy was recommended.  History also includes hypertension, hyperlipidemia, TIA/CVA, OSA on CPAP, bladder cancer, and prostate cancer.  He was previously followed by Dr. Wynonia Lawman.  He was maintained on clopidogrel but not aspirin.  He has had problems with hematuria and rectal bleeding concerning for GI bleed.  He has a history of Afib and bleeding, and attempted to have watchman device in 2016 that was unsuccessful. He is due to undergo cystoscopy with urology, for which he has received clearance and instructions to hold eliquis for 3 days.   He is maintained on amiodarone and eliquis; plavix without ASA. Imdur, lopressor, statin.    He presents today for follow up.    CAD with 90% distal LAD stenosis, 50-60% mid LAD stenosis (12/2014) - plavix, BB, statin, imdur   PAF Failed watchman device Chronic anticoagulation Hx of TIA/stroke - eliquis, amiodarone   Hypertensive heart disease   Hyperlipidemia 10/10/2018: Cholesterol, Total 195; HDL 42; LDL Chol Calc (NIH) 129; Triglycerides 136 - not at goal -     Hx of bleeding Hx of bladder cancer with hematuria Ongoing bleeding          Past Medical History:  Diagnosis Date  . Acute gout of left ankle    06-14-2015  . Bladder cancer (HCC)    BCG's tx's  . BPH (benign prostatic hypertrophy)   . CAD (coronary artery disease) CARDIOLOGIST-  DR TILLEY   a. NSTEMI 12/2014 -  99% dLAD-2 (not amenable to PCI), 50% dLAD-1, 60% mLAD. Medical therapy was recommended.  . Cough    HARSH  NONPRODUCTIVE COUGH 1 AND 1/2 WEEKS  . GERD (gastroesophageal reflux disease)    takes  OTC periodically  . Gilbert's syndrome   . H/O cardiac catheterization    a. Note: Difficult radial access in 12/2014 - recommend femoral approach if cath needed in the future.  Marland Kitchen History of kidney stones   . History of non-ST elevation myocardial infarction (NSTEMI)    12-12-2014   CARDIAC CATH W/ NO INTERVENTION X 2FEW DAYS APART  . History of spinal fracture 2002   LUMBAR  . Hyperlipidemia   . Hypertension   . OSA on CPAP    SET ON 7 USES SOME NIGHTS  . Paroxysmal A-fib [facility])    cardiologist-  dr Wynonia Lawman  . Prostate cancer ([place]) 2019   last PSA  2.9  (montiored by urologist  dr Junious Silk) [place] FEB 2019 RAD DONE  . Shingles    2 weeks ago  . Wears glasses     Past Surgical History:  Procedure Laterality Date  . BACK SURGERY  2014   LOWER l 1, 2 4 AND 5  . BILATERAL INGUINAL HERNIA REPAIR    . CARDIAC CATHETERIZATION N/A 12/13/2014   Procedure: Left Heart Cath and Coronary Angiography;  Surgeon: Leonie Man, MD;  Location: [facility];  Service: Cardiovascular;  Laterality: N/A;  culprit lesion diat LAD-2  99%, not  approachable via [place]  due to extremely tortuous up stream LAD/  mLAD 60% and dLAD-1 50%, both are at the extremely tortuous segment and not PCI targets/  otherwise normal coronary arteries  . COLONOSCOPY WITH PROPOFOL N/A 01/16/2019   Procedure: COLONOSCOPY WITH PROPOFOL;  Surgeon: Clarene Essex, MD;  [facility];  Service: Endoscopy;  Laterality: N/A;  . CYSTOSCOPY W/ RETROGRADES Left 08/22/2012   Procedure: CYSTOSCOPY WITH RETROGRADE PYELOGRAM;  left kidney washings;  Surgeon: Fredricka Bonine, MD;  Location: [facility];  Service: Urology;  Laterality: Left;  . CYSTOSCOPY W/ RETROGRADES N/A 07/08/2015   Procedure: CYSTOSCOPY WITH RETROGRADE PYELOGRAM;  Surgeon: [name], MD;  Location: [facility];  Service: Urology;   Laterality: N/A;  . CYSTOSCOPY WITH BIOPSY  08/22/2012   Procedure: CYSTOSCOPY WITH BIOPSY;  Surgeon: Fredricka Bonine, MD;  Location: [facility];  Service: Urology;;  . [name] WITH BIOPSY N/A 11/08/2014   Procedure: CYSTOSCOPY/BIOPSPY BLADDER INSTILLATION OF MARCAINE AND PYRIDIUM;  Surgeon: [name], MD;  Location: [facility];  Service: Urology;  Laterality: N/A;  . CYSTOSCOPY WITH BIOPSY N/A 07/08/2015   Procedure: CYSTOSCOPY WITH BLADDER BIOPSY, FULGURATION, RETROGRADE PYLOGRAM AND RENAL WASHING;  Surgeon: [name], MD;  Location: [facility];  Service: Urology;  Laterality: N/A;  . CYSTOSCOPY WITH RETROGRADE PYELOGRAM, URETEROSCOPY AND STENT PLACEMENT Bilateral 07/30/2014   Procedure: CYSTOSCOPY WITH BLADDER BX FULERGATION AND BILATERAL RETROGRADE PYELOGRAM,;  Surgeon: [name], MD;  [facility];  Service: Urology;  Laterality: Bilateral;  . CYSTOSCOPY WITH STENT PLACEMENT Left 03/24/2018   Procedure: STENT PLACEMENT AND BIOPSY;  Surgeon: [name], MD;  Location: [facility];  Service: Urology;  Laterality: Left;  . CYSTOSCOPY/RETROGRADE/URETEROSCOPY Bilateral 08/17/2013   Procedure: CYSTOSCOPY, BLADDER BIOPSY WITH BILATERAL RETROGRADE PYELOGRAM, LEFT URETEROSCOPY AND DILATION OF STRICTURE ;  Surgeon: [name], MD;  Location: [facility];  Service: Urology;  Laterality: Bilateral;  . CYSTOSCOPY/RETROGRADE/URETEROSCOPY Left 03/24/2018   Procedure: CYSTOSCOPY/RETROGRADE/URETEROSCOPY;  Surgeon: [name], MD;  Location: [facility];  Service: Urology;  Laterality: Left;  . HOT HEMOSTASIS N/A 01/16/2019   Procedure: HOT HEMOSTASIS (ARGON PLASMA COAGULATION/BICAP);  Surgeon: Clarene Essex, MD;  Location: [facility];  Service: Endoscopy;  Laterality: N/A;  . LEFT ATRIAL APPENDAGE OCCLUSION N/A 01/09/2015   Procedure: LEFT ATRIAL APPENDAGE OCCLUSION;   Surgeon: [name], MD;  Location: [place] [facility];  Service: Cardiovascular;  Laterality: N/A;  . LUMBAR LAMINECTOMY/DECOMPRESSION MICRODISCECTOMY Left 12/03/2013   Procedure: Left Lumbar three-four diskectomy with Lumbar four laminectomy ;  Surgeon: Newman Pies, MD;  Location: [facility] [facility];  Service: Neurosurgery;  Laterality: Left;  Left Lumbar three-four diskectomy with Lumbar four laminectomy   . ORIF LEFT ANKLE FX  2005  . POLYPECTOMY  01/16/2019   Procedure: POLYPECTOMY;  Surgeon: Clarene Essex, MD;  [facility];  Service: Endoscopy;;  . PROSTATE BIOPSY N/A 08/22/2012   Procedure: BIOPSY TRANSRECTAL ULTRASONIC PROSTATE (TUBP);  Surgeon: Fredricka Bonine, MD;  Location: [facility];  Service: Urology;  Laterality: N/A;  . TEE WITHOUT CARDIOVERSION N/A 12/31/2014   Procedure: TRANSESOPHAGEAL ECHOCARDIOGRAM (TEE);  Surgeon: Pixie Casino, MD;  Location: [facility] ENDOSCOPY;  Service: Cardiovascular;  Laterality: N/A;  ef 55-60%/  mild MR, TR, and PR/  mild LAE and RAE  . TRANSURETHRAL RESECTION OF BLADDER TUMOR  10/22/2011   Procedure: TRANSURETHRAL RESECTION OF BLADDER TUMOR (TURBT);  Surgeon: Fredricka Bonine, MD;  Location: [place];  Service: Urology;  Laterality: N/A;  TURBT, LEFT URETEROSCOPY, POSSIBLE URETERAL STENT   . URETEROSCOPY  10/22/2011   Procedure: URETEROSCOPY;  Surgeon: Fredricka Bonine, MD;  Location: [facility];  Service: Urology;  Laterality: Left;  . WISDOM TOOTH EXTRACTION      Current Medications: No outpatient medications have been marked as taking for the 03/23/19 encounter (Appointment) with Ledora Bottcher, [place].     Allergies:   Bee venom, Losartan, and Oxycodone   Social History   Socioeconomic History  . Marital status: Married    Spouse name: Not on file  . Number of children: Not on file  . Years of education: Not on file  . Highest education level: Not on file    Occupational History  . Occupation: Retired    Fish farm manager: OTHER  Tobacco Use  . Smoking status: Never Smoker  . Smokeless tobacco: Never Used  Substance and Sexual Activity  . Alcohol use: Yes    Alcohol/week: 7.0 standard drinks    Types: 7 Glasses of wine per week    Comment: red wine glass daily  . Drug use: No  . Sexual activity: Yes  Other Topics Concern  . Not on file  Social History Narrative  . Not on file   Social Determinants of Health   Financial Resource Strain:   . Difficulty of Paying Living Expenses: Not on file  Food Insecurity:   . Worried About Charity fundraiser in the Last Year: Not on file  . Ran Out of Food in the Last Year: Not on file  Transportation Needs:   . Lack of Transportation (Medical): Not on file  . Lack of Transportation (Non-Medical): Not on file  Physical Activity:   . Days of Exercise per Week: Not on file  . Minutes of Exercise per Session: Not on file  Stress:   . Feeling of Stress : Not on file  Social Connections:   . Frequency of Communication with Friends and Family: Not on file  . Frequency of Social Gatherings with Friends and Family: Not on file  . Attends Religious Services: Not on file  . Active Member of Clubs or Organizations: Not on file  . Attends Archivist Meetings: Not on file  . Marital Status: Not on file     Family History: The patient's ***family history includes Cancer in his mother and sister; Heart attack in his father; Leukemia in his mother. There is no history of Hypertension or Stroke.  ROS:   Please see the history of present illness.    *** All other systems reviewed and are negative.  EKGs/Labs/Other Studies Reviewed:    The following studies were reviewed today: ***  EKG:  EKG is *** ordered today.  The ekg ordered today demonstrates ***  Recent Labs: 10/10/2018: TSH 7.150 03/18/2019: ALT 20; BUN 18; Creatinine, Ser 1.31; Hemoglobin 17.1; Platelets 318; Potassium 4.2; Sodium 139   Recent Lipid Panel    Component Value Date/Time   CHOL 195 10/10/2018 1141   TRIG 136 10/10/2018 1141   HDL 42 10/10/2018 1141   CHOLHDL 4.6 10/10/2018 1141   CHOLHDL 4.4 12/12/2014 1721   VLDL 18 12/12/2014 1721   LDLCALC 129 (H) 10/10/2018 1141    Physical Exam:    VS:  There were no vitals taken for this visit.    Wt Readings from Last 3 Encounters:  01/16/19 213 lb (96.6 kg)  10/10/18 219 lb (  99.3 kg)  03/24/18 220 lb 6.4 oz (100 kg)     GEN: *** Well nourished, well developed in no acute distress HEENT: Normal NECK: No JVD; No carotid bruits LYMPHATICS: No lymphadenopathy CARDIAC: ***RRR, no murmurs, rubs, gallops RESPIRATORY:  Clear to auscultation without rales, wheezing or rhonchi  ABDOMEN: Soft, non-tender, non-distended MUSCULOSKELETAL:  No edema; No deformity  SKIN: Warm and dry NEUROLOGIC:  Alert and oriented x 3 PSYCHIATRIC:  Normal affect   ASSESSMENT:    No diagnosis found. PLAN:    In order of problems listed above:  No diagnosis found.   Medication Adjustments/Labs and Tests Ordered: Current medicines are reviewed at length with the patient today.  Concerns regarding medicines are outlined above.  No orders of the defined types were placed in this encounter.  No orders of the defined types were placed in this encounter.   Signed, Ledora Bottcher, [place]  03/22/2019 8:44 PM    [facility]

## 2019-03-23 ENCOUNTER — Ambulatory Visit: Payer: PPO | Admitting: Physician Assistant

## 2019-03-23 NOTE — Progress Notes (Signed)
Anesthesia Chart Review   Case: [id number] Date/Time: 03/24/19 0815   Procedure: CYSTOSCOPY WITH RETROGRADE PYELOGRAM/URETERAL STENT PLACEMENT (Left )   Anesthesia type: General   Pre-op diagnosis: LEFT URETEROPELVIC JUNCTION OBSTRUCTION, GROSS HEMATURIA   [facility]   Surgeons: [name], MD      DISCUSSION:83 y.o. never smoker with h/o HTN, GERD, OSA on CPAP, PAF, CAD, prostate cancer, left ureteropelvic junction obstruction scheduled for above procedure 03/24/19 with Dr. [name].   Cleared by cardiology 03/22/19.  Per OV note, "Chart reviewed as part of pre-operative protocol coverage. Given past medical history and time since last visit, based on ACC/AHA guidelines, Charles Wright would be at acceptable risk for the planned procedure without further cardiovascular testing.  He has a class IV risk, 11% risk of major cardiac event. Procedure:Cystoscopy, Ureteroscopy with stent placement Date of procedure:TBD CHADS2-VASc score of6(HTN, AGE,stroke/tia x 2, CAD, AGE) CrCl49 ml/min He may hold his Eliquis 2 days prior to the procedure and restart after."  Anticipate pt can proceed with planned procedure barring acute status change.   VS: There were no vitals taken for this visit.  PROVIDERS: Maury Dus, MD is PCP   Lyman Bishop, MD is Cardiologist  LABS: Labs reviewed: Acceptable for surgery. (all labs ordered are listed, but only abnormal results are displayed)  Labs Reviewed - No data to display   IMAGES:   EKG: 10/10/2018 Rate 57 bpm  Sinus bradycardia   CV: Echo 05/28/2016 Conclusion 1. Left ventricle cavity is normal in size.  Mild to moderate concentric hypertrophy of the left ventricle.  Normal global wall motion.  Calculated EF 55%.  2. Left atrial cavity is mildly dilated 3. Mild (Grade I) aortic regurgitation 4. Trace mitral regurgitation 5. Trace pulmonic regurgitation Past Medical History:  Diagnosis Date  .  Acute gout of left ankle    06-14-2015  . Bladder cancer (HCC)    BCG's tx's  . BPH (benign prostatic hypertrophy)   . CAD (coronary artery disease) CARDIOLOGIST-  DR TILLEY   a. NSTEMI 12/2014 -  99% dLAD-2 (not amenable to PCI), 50% dLAD-1, 60% mLAD. Medical therapy was recommended.  . Cough    HARSH NONPRODUCTIVE COUGH 1 AND 1/2 WEEKS  . GERD (gastroesophageal reflux disease)    takes  OTC periodically  . Gilbert's syndrome   . H/O cardiac catheterization    a. Note: Difficult radial access in 12/2014 - recommend femoral approach if cath needed in the future.  Marland Kitchen History of kidney stones   . History of non-ST elevation myocardial infarction (NSTEMI)    12-12-2014   CARDIAC CATH W/ NO INTERVENTION X 2FEW DAYS APART  . History of spinal fracture 2002   LUMBAR  . Hyperlipidemia   . Hypertension   . OSA on CPAP    SET ON 7 USES SOME NIGHTS  . Paroxysmal A-fib [facility])    cardiologist-  dr Wynonia Lawman  . Prostate cancer ([place]) 2019   last PSA  2.9  (montiored by urologist  dr Junious Silk) [place] FEB 2019 RAD DONE  . Shingles    2 weeks ago  . Wears glasses     Past Surgical History:  Procedure Laterality Date  . BACK SURGERY  2014   LOWER l 1, 2 4 AND 5  . BILATERAL INGUINAL HERNIA REPAIR    . CARDIAC CATHETERIZATION N/A 12/13/2014   Procedure: Left Heart Cath and Coronary Angiography;  Surgeon: Leonie Man, MD;  Location: [facility]  INVASIVE [facility];  Service: Cardiovascular;  Laterality: N/A;  culprit lesion diat LAD-2  99%, not approachable via PCTA  due to extremely tortuous up stream LAD/  mLAD 60% and dLAD-1 50%, both are at the extremely tortuous segment and not PCI targets/  otherwise normal coronary arteries  . COLONOSCOPY WITH PROPOFOL N/A 01/16/2019   Procedure: COLONOSCOPY WITH PROPOFOL;  Surgeon: Clarene Essex, MD;  [facility];  Service: Endoscopy;  Laterality: N/A;  . CYSTOSCOPY W/ RETROGRADES Left 08/22/2012   Procedure: CYSTOSCOPY WITH RETROGRADE PYELOGRAM;  left kidney  washings;  Surgeon: Fredricka Bonine, MD;  Location: [facility];  Service: Urology;  Laterality: Left;  . CYSTOSCOPY W/ RETROGRADES N/A 07/08/2015   Procedure: CYSTOSCOPY WITH RETROGRADE PYELOGRAM;  Surgeon: [name], MD;  Location: [facility];  Service: Urology;  Laterality: N/A;  . CYSTOSCOPY WITH BIOPSY  08/22/2012   Procedure: CYSTOSCOPY WITH BIOPSY;  Surgeon: Fredricka Bonine, MD;  Location: [facility];  Service: Urology;;  . [name] WITH BIOPSY N/A 11/08/2014   Procedure: CYSTOSCOPY/BIOPSPY BLADDER INSTILLATION OF MARCAINE AND PYRIDIUM;  Surgeon: [name], MD;  Location: [facility];  Service: Urology;  Laterality: N/A;  . CYSTOSCOPY WITH BIOPSY N/A 07/08/2015   Procedure: CYSTOSCOPY WITH BLADDER BIOPSY, FULGURATION, RETROGRADE PYLOGRAM AND RENAL WASHING;  Surgeon: [name], MD;  Location: [facility];  Service: Urology;  Laterality: N/A;  . CYSTOSCOPY WITH RETROGRADE PYELOGRAM, URETEROSCOPY AND STENT PLACEMENT Bilateral 07/30/2014   Procedure: CYSTOSCOPY WITH BLADDER BX FULERGATION AND BILATERAL RETROGRADE PYELOGRAM,;  Surgeon: [name], MD;  [facility];  Service: Urology;  Laterality: Bilateral;  . CYSTOSCOPY WITH STENT PLACEMENT Left 03/24/2018   Procedure: STENT PLACEMENT AND BIOPSY;  Surgeon: [name], MD;  Location: [facility];  Service: Urology;  Laterality: Left;  . CYSTOSCOPY/RETROGRADE/URETEROSCOPY Bilateral 08/17/2013   Procedure: CYSTOSCOPY, BLADDER BIOPSY WITH BILATERAL RETROGRADE PYELOGRAM, LEFT URETEROSCOPY AND DILATION OF STRICTURE ;  Surgeon: [name], MD;  Location[facility];  Service: Urology;  Laterality: Bilateral;  . CYSTOSCOPY/RETROGRADE/URETEROSCOPY Left 03/24/2018   Procedure: CYSTOSCOPY/RETROGRADE/URETEROSCOPY;  Surgeon: [name], MD;  Location: [facility];  Service:  Urology;  Laterality: Left;  . HOT HEMOSTASIS N/A 01/16/2019   Procedure: HOT HEMOSTASIS (ARGON PLASMA COAGULATION/BICAP);  Surgeon: Clarene Essex, MD;  Location[facility];  Service: Endoscopy;  Laterality: N/A;  . LEFT ATRIAL APPENDAGE OCCLUSION N/A 01/09/2015   Procedure: LEFT ATRIAL APPENDAGE OCCLUSION;  Surgeon: [name], MD;  Location: [place] [facility];  Service: Cardiovascular;  Laterality: N/A;  . LUMBAR LAMINECTOMY/DECOMPRESSION MICRODISCECTOMY Left 12/03/2013   Procedure: Left Lumbar three-four diskectomy with Lumbar four laminectomy ;  Surgeon: Newman Pies, MD;  Location: [place] [facility];  Service: Neurosurgery;  Laterality: Left;  Left Lumbar three-four diskectomy with Lumbar four laminectomy   . ORIF LEFT ANKLE FX  2005  . POLYPECTOMY  01/16/2019   Procedure: POLYPECTOMY;  Surgeon: Clarene Essex, MD;  [facility];  Service: Endoscopy;;  . PROSTATE BIOPSY N/A 08/22/2012   Procedure: BIOPSY TRANSRECTAL ULTRASONIC PROSTATE (TUBP);  Surgeon: Fredricka Bonine, MD;  Location: [facility];  Service: Urology;  Laterality: N/A;  . TEE WITHOUT CARDIOVERSION N/A 12/31/2014   Procedure: TRANSESOPHAGEAL ECHOCARDIOGRAM (TEE);  Surgeon: Pixie Casino, MD;  Location: [facility] ENDOSCOPY;  Service: Cardiovascular;  Laterality: N/A;  ef 55-60%/  mild MR, TR, and PR/  mild LAE and RAE  . TRANSURETHRAL RESECTION OF BLADDER TUMOR  10/22/2011   Procedure: TRANSURETHRAL RESECTION OF BLADDER TUMOR (TURBT);  Surgeon: Fredricka Bonine, MD;  Location: [facility];  Service: Urology;  Laterality: N/A;  TURBT, LEFT URETEROSCOPY, POSSIBLE URETERAL STENT   . URETEROSCOPY  10/22/2011   Procedure: URETEROSCOPY;  Surgeon: Fredricka Bonine, MD;  Location: [facility];  Service: Urology;  Laterality: Left;  . WISDOM TOOTH EXTRACTION      MEDICATIONS: No current facility-administered medications for this encounter.   Marland Kitchen allopurinol (ZYLOPRIM)  300 MG tablet  . apixaban (ELIQUIS) 5 MG TABS tablet  . Ascorbic Acid (VITAMIN C PO)  . aspirin EC 81 MG tablet  . Cholecalciferol (VITAMIN D-3) 1000 UNITS CAPS  . EPINEPHrine 0.3 mg/0.3 mL IJ SOAJ injection  . Glucosamine Sulfate (DONA PO)  . HYDROcodone-acetaminophen (NORCO/VICODIN) 5-325 MG tablet  . isosorbide mononitrate (IMDUR) 30 MG 24 hr tablet  . levothyroxine (SYNTHROID, LEVOTHROID) 50 MCG tablet  . metoprolol (LOPRESSOR) 50 MG tablet  . Multiple Vitamin (MULTIVITAMIN) tablet  . Multiple Vitamins-Minerals (ZINC PO)  . nitroGLYCERIN (NITROSTAT) 0.4 MG SL tablet  . pravastatin (PRAVACHOL) 40 MG tablet  . Testosterone 1.62 % GEL     Maia Plan [facility] Pre-Surgical Testing [phone] 03/23/19  11:39 AM

## 2019-03-24 ENCOUNTER — Encounter (HOSPITAL_COMMUNITY)
Admission: RE | Disposition: A | Discharge status: Home / Self Care | Payer: Self-pay | Source: Home / Self Care | Attending: Urology

## 2019-03-24 ENCOUNTER — Ambulatory Visit (HOSPITAL_COMMUNITY)
Admission: RE | Admit: 2019-03-24 | Discharge: 2019-03-24 | Disposition: A | Discharge status: Home / Self Care | Payer: PPO | Attending: Urology | Admitting: Urology

## 2019-03-24 ENCOUNTER — Ambulatory Visit (HOSPITAL_COMMUNITY): Payer: PPO

## 2019-03-24 ENCOUNTER — Other Ambulatory Visit: Payer: Self-pay

## 2019-03-24 ENCOUNTER — Ambulatory Visit (HOSPITAL_COMMUNITY): Payer: PPO | Admitting: Physician Assistant

## 2019-03-24 ENCOUNTER — Encounter (HOSPITAL_COMMUNITY): Payer: Self-pay | Admitting: Urology

## 2019-03-24 DIAGNOSIS — Z8551 Personal history of malignant neoplasm of bladder: Secondary | ICD-10-CM | POA: Diagnosis not present

## 2019-03-24 DIAGNOSIS — M109 Gout, unspecified: Secondary | ICD-10-CM | POA: Insufficient documentation

## 2019-03-24 DIAGNOSIS — Z7982 Long term (current) use of aspirin: Secondary | ICD-10-CM | POA: Diagnosis not present

## 2019-03-24 DIAGNOSIS — I10 Essential (primary) hypertension: Secondary | ICD-10-CM | POA: Diagnosis not present

## 2019-03-24 DIAGNOSIS — D49512 Neoplasm of unspecified behavior of left kidney: Secondary | ICD-10-CM | POA: Diagnosis not present

## 2019-03-24 DIAGNOSIS — Z79899 Other long term (current) drug therapy: Secondary | ICD-10-CM | POA: Diagnosis not present

## 2019-03-24 DIAGNOSIS — I480 Paroxysmal atrial fibrillation: Secondary | ICD-10-CM | POA: Insufficient documentation

## 2019-03-24 DIAGNOSIS — E785 Hyperlipidemia, unspecified: Secondary | ICD-10-CM | POA: Insufficient documentation

## 2019-03-24 DIAGNOSIS — Z8546 Personal history of malignant neoplasm of prostate: Secondary | ICD-10-CM | POA: Insufficient documentation

## 2019-03-24 DIAGNOSIS — Z79891 Long term (current) use of opiate analgesic: Secondary | ICD-10-CM | POA: Diagnosis not present

## 2019-03-24 DIAGNOSIS — C61 Malignant neoplasm of prostate: Secondary | ICD-10-CM | POA: Diagnosis not present

## 2019-03-24 DIAGNOSIS — N3289 Other specified disorders of bladder: Secondary | ICD-10-CM | POA: Diagnosis not present

## 2019-03-24 DIAGNOSIS — I252 Old myocardial infarction: Secondary | ICD-10-CM | POA: Insufficient documentation

## 2019-03-24 DIAGNOSIS — I2510 Atherosclerotic heart disease of native coronary artery without angina pectoris: Secondary | ICD-10-CM | POA: Insufficient documentation

## 2019-03-24 DIAGNOSIS — Q6239 Other obstructive defects of renal pelvis and ureter: Secondary | ICD-10-CM | POA: Diagnosis not present

## 2019-03-24 DIAGNOSIS — Z7901 Long term (current) use of anticoagulants: Secondary | ICD-10-CM | POA: Insufficient documentation

## 2019-03-24 DIAGNOSIS — Z79890 Hormone replacement therapy (postmenopausal): Secondary | ICD-10-CM | POA: Insufficient documentation

## 2019-03-24 DIAGNOSIS — G4733 Obstructive sleep apnea (adult) (pediatric): Secondary | ICD-10-CM | POA: Insufficient documentation

## 2019-03-24 DIAGNOSIS — N135 Crossing vessel and stricture of ureter without hydronephrosis: Secondary | ICD-10-CM | POA: Diagnosis not present

## 2019-03-24 DIAGNOSIS — R896 Abnormal cytological findings in specimens from other organs, systems and tissues: Secondary | ICD-10-CM | POA: Diagnosis not present

## 2019-03-24 DIAGNOSIS — R310 Gross hematuria: Secondary | ICD-10-CM | POA: Diagnosis not present

## 2019-03-24 DIAGNOSIS — D4112 Neoplasm of uncertain behavior of left renal pelvis: Secondary | ICD-10-CM | POA: Diagnosis not present

## 2019-03-24 HISTORY — PX: CYSTOSCOPY W/ URETERAL STENT PLACEMENT: SHX1429

## 2019-03-24 SURGERY — CYSTOSCOPY, WITH RETROGRADE PYELOGRAM AND URETERAL STENT INSERTION
Anesthesia: General | Site: Ureter | Laterality: Left

## 2019-03-24 MED ORDER — PROPOFOL 10 MG/ML IV BOLUS
INTRAVENOUS | Status: DC | PRN
  Administered 2019-03-24: 200 mg via INTRAVENOUS

## 2019-03-24 MED ORDER — PROPOFOL 10 MG/ML IV BOLUS
INTRAVENOUS | Status: AC
  Filled 2019-03-24: qty 20

## 2019-03-24 MED ORDER — IOHEXOL 300 MG/ML  SOLN
INTRAMUSCULAR | Status: DC | PRN
  Administered 2019-03-24: 09:00:00 50 mL

## 2019-03-24 MED ORDER — NITROFURANTOIN MACROCRYSTAL 100 MG PO CAPS: 100.0000 mg | ORAL_CAPSULE | Freq: Every day | ORAL | 0 refills | Status: AC

## 2019-03-24 MED ORDER — FENTANYL CITRATE (PF) 100 MCG/2ML IJ SOLN
INTRAMUSCULAR | Status: DC | PRN
  Administered 2019-03-24: 25 ug via INTRAVENOUS
  Administered 2019-03-24 (×2): 50 ug via INTRAVENOUS
  Administered 2019-03-24: 25 ug via INTRAVENOUS
  Administered 2019-03-24: 50 ug via INTRAVENOUS

## 2019-03-24 MED ORDER — APIXABAN 5 MG PO TABS: 5.0000 mg | ORAL_TABLET | Freq: Two times a day (BID) | ORAL | 11 refills | Status: DC

## 2019-03-24 MED ORDER — FENTANYL CITRATE (PF) 100 MCG/2ML IJ SOLN
INTRAMUSCULAR | Status: AC
  Filled 2019-03-24: qty 2

## 2019-03-24 MED ORDER — CEFAZOLIN SODIUM-DEXTROSE 2-4 GM/100ML-% IV SOLN
INTRAVENOUS | Status: AC
  Filled 2019-03-24: qty 100

## 2019-03-24 MED ORDER — CEFAZOLIN SODIUM-DEXTROSE 2-4 GM/100ML-% IV SOLN
2.0000 g | Freq: Once | INTRAVENOUS | Status: AC
  Administered 2019-03-24: 09:00:00 2 g via INTRAVENOUS

## 2019-03-24 MED ORDER — LIDOCAINE 2% (20 MG/ML) 5 ML SYRINGE
INTRAMUSCULAR | Status: DC | PRN
  Administered 2019-03-24: 100 mg via INTRAVENOUS

## 2019-03-24 MED ORDER — ONDANSETRON HCL 4 MG/2ML IJ SOLN
INTRAMUSCULAR | Status: AC
  Filled 2019-03-24: qty 2

## 2019-03-24 MED ORDER — CIPROFLOXACIN IN D5W 400 MG/200ML IV SOLN
INTRAVENOUS | Status: AC
  Filled 2019-03-24: qty 200

## 2019-03-24 MED ORDER — MEPERIDINE HCL 50 MG/ML IJ SOLN: 6.2500 mg | INTRAMUSCULAR | Status: DC | PRN

## 2019-03-24 MED ORDER — SODIUM CHLORIDE 0.9 % IV SOLN: INTRAVENOUS | Status: DC

## 2019-03-24 MED ORDER — LIDOCAINE 2% (20 MG/ML) 5 ML SYRINGE
INTRAMUSCULAR | Status: AC
  Filled 2019-03-24: qty 5

## 2019-03-24 MED ORDER — SODIUM CHLORIDE 0.9 % IR SOLN
Status: DC | PRN
  Administered 2019-03-24: 4000 mL

## 2019-03-24 MED ORDER — DEXAMETHASONE SODIUM PHOSPHATE 10 MG/ML IJ SOLN
INTRAMUSCULAR | Status: DC | PRN
  Administered 2019-03-24: 10 mg via INTRAVENOUS

## 2019-03-24 MED ORDER — ONDANSETRON HCL 4 MG/2ML IJ SOLN: 4.0000 mg | Freq: Once | INTRAMUSCULAR | Status: DC | PRN

## 2019-03-24 MED ORDER — OXYBUTYNIN CHLORIDE 5 MG PO TABS: 5.0000 mg | ORAL_TABLET | Freq: Three times a day (TID) | ORAL | 0 refills | Status: AC | PRN

## 2019-03-24 MED ORDER — ONDANSETRON HCL 4 MG/2ML IJ SOLN
INTRAMUSCULAR | Status: DC | PRN
  Administered 2019-03-24: 4 mg via INTRAVENOUS

## 2019-03-24 MED ORDER — CIPROFLOXACIN IN D5W 400 MG/200ML IV SOLN
400.0000 mg | Freq: Once | INTRAVENOUS | Status: AC
  Administered 2019-03-24: 400 mg via INTRAVENOUS

## 2019-03-24 MED ORDER — FENTANYL CITRATE (PF) 100 MCG/2ML IJ SOLN
25.0000 ug | INTRAMUSCULAR | Status: DC | PRN
  Administered 2019-03-24 (×2): 50 ug via INTRAVENOUS

## 2019-03-24 MED ORDER — DEXAMETHASONE SODIUM PHOSPHATE 10 MG/ML IJ SOLN
INTRAMUSCULAR | Status: AC
  Filled 2019-03-24: qty 1

## 2019-03-24 MED ORDER — LACTATED RINGERS IV SOLN: INTRAVENOUS | Status: DC

## 2019-03-24 MED ORDER — METOPROLOL TARTRATE 50 MG PO TABS
50.0000 mg | ORAL_TABLET | Freq: Once | ORAL | Status: AC
  Administered 2019-03-24: 50 mg via ORAL
  Filled 2019-03-24: qty 1

## 2019-03-24 SURGICAL SUPPLY — 21 items
BAG URO CATCHER STRL LF (MISCELLANEOUS) ×3 IMPLANT
BASKET STONE NCOMPASS (UROLOGICAL SUPPLIES) ×3 IMPLANT
BASKET ZERO TIP NITINOL 2.4FR (BASKET) ×3 IMPLANT
BRUSH URET BIOPSY 3F (UROLOGICAL SUPPLIES) ×6 IMPLANT
CATH FOLEY 2WAY SLVR  5CC 18FR (CATHETERS) ×2
CATH FOLEY 2WAY SLVR 5CC 18FR (CATHETERS) ×1 IMPLANT
CATH INTERMIT  6FR 70CM (CATHETERS) ×3 IMPLANT
CLOTH BEACON ORANGE TIMEOUT ST (SAFETY) ×3 IMPLANT
GLOVE BIOGEL M STRL SZ7.5 (GLOVE) ×3 IMPLANT
GOWN STRL REUS W/TWL XL LVL3 (GOWN DISPOSABLE) ×3 IMPLANT
GUIDEWIRE ANG ZIPWIRE 038X150 (WIRE) ×3 IMPLANT
GUIDEWIRE STR DUAL SENSOR (WIRE) ×3 IMPLANT
KIT TURNOVER KIT A (KITS) IMPLANT
MANIFOLD NEPTUNE II (INSTRUMENTS) ×3 IMPLANT
PACK CYSTO (CUSTOM PROCEDURE TRAY) ×3 IMPLANT
SHEATH URETERAL 12FRX35CM (MISCELLANEOUS) ×3 IMPLANT
STENT POLARIS 5FRX26 (STENTS) ×3 IMPLANT
SYR TOOMEY IRRIG 70ML (MISCELLANEOUS) ×3
SYRINGE TOOMEY IRRIG 70ML (MISCELLANEOUS) ×1 IMPLANT
TUBING CONNECTING 10 (TUBING) ×2 IMPLANT
TUBING CONNECTING 10' (TUBING) ×1

## 2019-03-24 NOTE — Transfer of Care (Signed)
Immediate Anesthesia Transfer of Care Note  Patient: Charles Wright  Procedure(s) Performed: CYSTOSCOPY WITH RETROGRADE PYELOGRAM/URETERAL STENT PLACEMENT ureteroscopy biopsy of neoplasm (Left Ureter)  Patient Location: PACU  Anesthesia Type:General  Level of Consciousness: awake, alert , oriented and patient cooperative  Airway & Oxygen Therapy: Patient Spontanous Breathing, Patient connected to face mask oxygen and hearing aids intact both ears.  Post-op Assessment: Report given to RN and Post -op Vital signs reviewed and stable  Post vital signs: Reviewed and stable  Last Vitals:  Vitals Value Taken Time  BP 134/80 03/24/19 1012  Temp    Pulse 78 03/24/19 1014  Resp 18 03/24/19 1014  SpO2 100 % 03/24/19 1014  Vitals shown include unvalidated device data.  Last Pain:  Vitals:   03/24/19 0715  TempSrc:   PainSc: 4       Patients Stated Pain Goal: 3 ([id number])  Complications: No apparent anesthesia complications

## 2019-03-24 NOTE — Op Note (Signed)
Preoperative diagnosis: Gross hematuria, left renal pelvis bleeding Postoperative diagnosis: Gross hematuria, left renal pelvis neoplasm  Procedure: Cystoscopy, left retrograde pyelogram, left ureteroscopy with biopsy of neoplasm, left ureteral stent placement  Surgeon: Junious Silk  Assistant: Hayon  Anesthesia: General  Indication for procedure: [name] is an 84 year old male who presented with left flank pain and gross hematuria about a week ago.  Noncontrast CT scan revealed clot in the left renal pelvis and a clot in the bladder.  He was brought today for diagnostic evaluation.  Findings: On cystoscopy the urethra and the prostatic urethra were unremarkable.  Directly in the posterior bladder was very small area of submucosal hemorrhage likely from prior Foley placement and bladder irrigation.  There were no neoplastic growths in the bladder.  There was a small amount of clot in the bladder.  No stone or foreign body in the bladder.  The ureteral orifices were in their normal orthotopic incision.  There is clear right efflux.  Left UO was slightly displaced laterally and may have been prior resected.  Left retrograde pyelogram-the ureter appeared normal without filling defect or stricture.  The renal pelvis had a large filling defect and the lower pole infundibulum also appeared to be obstruction and did not fill with contrast.  The upper pole infundibulum and midpole filled appropriately.  Left ureteroscopy revealed clot in the left proximal ureter, a nodular neoplastic growth just at the left UPJ inferior laterally and then the posterior inferior pelvis with another nodular, papillary appearing neoplastic growth.  The papillary nature appeared very fine and friable.  On withdrawal of the ureteroscope the ureter again appeared normal without tumor or injury.  Description of procedure: After consent was obtained patient brought to the operating room.  After adequate anesthesia was placed in  lithotomy position and prepped and draped in the usual sterile fashion.  A timeout was performed to confirm the patient and procedure.  The cystoscope was passed per urethra and the bladder inspected.  The left ureteral orifice was cannulated with a 5 [place] open-ended catheter and left retrograde injection of contrast was performed.  A sensor wire was then advanced to the upper pole calyx and we performed semirigid ureteroscopy along the wire up to the left proximal ureter which was noted to be normal.  We then passed a Glidewire directly through the semirigid ureteroscope and backed this out.  The ureteral access sheath was advanced into the proximal ureter without difficulty and a dual-lumen digital ureteroscope was advanced.  There was clot in the proximal ureter which was cleared out with a 0 tip basket.  We also tried a encompass basket.  We then inspected the collecting system.  It was difficult to inspect the collecting system because of clot and bleeding but again the nodular growth was noted right at the UPJ and another nodular growth in the pelvis.  There may be more.  Did seem to be in the pelvis and extending toward the lower pole.  We then took a brush biopsy and brushed the tumor in the pelvis and sent that for pathologic exam.  We then tried to get more tissue with the baskets and finally found the ureteroscopic biopsy forcep.  These were used to take some biopsies of the tumor at the UPJ.  And then finally we filled the renal pelvis with saline and took several washings for cytology.  The access sheath was then backed out on the ureteroscope and the collecting system renal pelvis and ureter inspected again and  noted to be free from injury.  The wire was backloaded on the cystoscope and a 5 x 26 cm Polaris stent was advanced.  A good coil was seen in the upper pole calyx and a good coil in the bladder.  There was a small amount of clot in the bladder evacuated.  We placed an 68 French catheter for  max drainage and to prevent reflux for a few days.  He was then awakened taken to recovery room in stable condition.  Complications: None  Blood loss: Minimal  Specimens to pathology: #1 brush biopsy left renal pelvis #2 biopsy left renal pelvis #3 left renal pelvis cytology  Drains: 5 x 26 cm Polaris stent without string, 18 French Foley catheter  Disposition: Patient stable to PACU

## 2019-03-24 NOTE — Discharge Instructions (Signed)
1. You may see some blood in the urine and may have some burning with urination for 48-72 hours. You also may notice that you have to urinate more frequently or urgently after your procedure which is normal.  2. You should call should you develop an inability urinate, fever > 101, persistent nausea and vomiting that prevents you from eating or drinking to stay hydrated.  3. If you have a stent, you will likely urinate more frequently and urgently until the stent is removed and you may experience some discomfort/pain in the lower abdomen and flank especially when urinating. You may take pain medication prescribed to you if needed for pain. You may also intermittently have blood in the urine until the stent is removed. 4. If you have a catheter, you will be taught how to take care of the catheter by the nursing staff prior to discharge from the hospital.  You may periodically feel a strong urge to void with the catheter in place.  This is a bladder spasm and most often can occur when having a bowel movement or moving around. It is typically self-limited and usually will stop after a few minutes.  You may use some Vaseline or Neosporin around the tip of the catheter to reduce friction at the tip of the penis. You may also see some blood in the urine.  A very small amount of blood can make the urine look quite red.  As long as the catheter is draining well, there usually is not a problem.  However, if the catheter is not draining well and is bloody, you should call the office [phone]) to notify us.  5.. MEDICATIONS - HOLD Eliquis until foley removal - Start macrodantin nightly to prevent infection - Take ditropan for bladder discomfort only as needed  6. Follow up - Plan to follow up Monday or Tuesday for foley removal. Please call the clinic Monday morning to confirm appointment date/time

## 2019-03-24 NOTE — H&P (Signed)
Office Visit Report     03/19/2019   --------------------------------------------------------------------------------   Charles Wright  [id number]  DOB: 10/06/1935, 84 year old Male  SSN: [id number]   PRIMARY CARE:  Robert A. Alyson Ingles, MD  REFERRING:  Georgette Dover, MD  PROVIDER:  [name], M.D.  LOCATION:  [facility] [phone]     --------------------------------------------------------------------------------   CC: I have UPJ obstruction.  HPI: Charles Wright is a 84 year-old male established patient who is here for UPJ obstruction.  The problem is on the left side. His problem was diagnosed 02/21/2018. He had the following x-rays done: CT Scan.   H/o left distal ureteral stricture noted on URS in 2010. F/u left distal ureteral stricture seen on URS in 2013, but no mass.   In 2015, he had left flank pain nausea and vomiting. White count 11.4, BUN 18, creatinine 1.1, UA - 40 red blood cells, 58 white blood cells, no bacteria. Afebrile. Treated with Cipro. CT scan showed stranding around the left peripelvic fat and left ureter with mild ureteral dilation. There were no stones. F/u studies negative.   He had left flank pain January 2020 and underwent a follow-up CT hematuria protocol. This revealed some narrowing of the left proximal ureter and possibly thickening. He was taken 03/24/2018 to OR and left RGP showed some narrowing in the left distal ureter and left proximal ureter and no filling defect. Ureteroscopy was successful through the left distal ureter up into the proximal ureter. Once in the proximal ureter at the UPJ was noted to be narrowed but no mucosal lesion or tumor. Brush biopsy and renal washing was negative for high-grade urothelial carcinoma. His symptoms were much worse with the stent and it was removed. F/u US no hydro.   He had recurring left flank pain. It was a dull ache that comes and goes. He underwent repeat CT and pelvis scan of the  abdomen on 09/05/2018 which appeared benign. Some narrowing of the proximal ureter with this noted but no significant hydronephrosis and contrast passed through without difficulty. He has pain in LLQ especially in the AM. Abx did help in the past. A BM might help relieve some of it. Nl DRE. Renal US benign 12/13/2018.   He continues to have worsening left flank pain and clot passage over the past month. On 03/18/2019 he went to ED with clots and left flank pain. CT non-con revealed clot in the left collecting system and bladder. Cr 1.3. Hct 52. PVR today 470 ml and he is having trouble voiding.     ALLERGIES: losartan - Other Reaction, cough OxyCODONE HCl TABS - Other Reaction, mood change Wasp Venom Protein - Redness, Skin Rash, Itching, Swelling, Hives    MEDICATIONS: Doxycycline Hyclate PRN  Levothyroxine Sodium 50 mcg tablet  Allopurinol 300 mg tablet Oral  Amiodarone Hcl 200 mg tablet  Aspirin Ec 81 mg tablet, delayed release  Colchicine PRN  Eliquis 5 mg tablet 1 tablet PO BID  EpiPen 0.3 MG/0.3ML DEVI Injection  Glucosamine Sulfate 500 mg capsule Oral  Hydrocodone-Acetaminophen  Isosorbide Mononitrate 20 mg tablet Oral  Metoprolol Tartrate 75 mg tablet Oral  Multiple Vitamin  NITROGLYCERIN SL PRN  Vitamin D TABS Oral     GU PSH: Cysto Bladder Ureth Biopsy - 2010 Cysto Dilate Ureteral Stricture - 2010 Cysto Fulgurate < 0.5 cm - 2017, 2016, 2016, 2015, 2014 Cysto Remove Stent FB Sim - 04/10/2018 Cysto Uretero Biopsy Fulgura, Left - 03/24/2018, 2013 Cystoscopy -  01/09/2019, 02/13/2018, 05/17/2017, 11/15/2016, 05-28-16, May 29, 2015 Cystoscopy And Biopsy - 05-28-08 Cystoscopy Insert Stent, Left - 03/24/2018, 05/29/11, 2008/05/28, May 28, 2008 Cystoscopy TURBT <2 cm - 2011-05-29 Cystoscopy Ureteroscopy - 05-28-2008 Locm 300-399Mg /Ml Iodine,1Ml - 09/05/2018, 02/21/2018 PLACE RT DEVICE/MARKER, PROS - 11/12/2016 Prostate Needle Biopsy - 09/20/2016, May 28, 2012       [place] Notes: Oral Surgery  Leg Repair   NON-GU PSH: Surgical Pathology, Gross  And Microscopic Examination For Prostate Needle - 09/20/2016     GU PMH: History of bladder cancer (Stable) - 01/09/2019, - 02/13/2018 History of prostate cancer (Stable) - 01/09/2019, - 09/29/2018 Microscopic hematuria (Stable) - 01/09/2019, - 08/31/2018 UPJ obstruction (congenital) - 01/09/2019, - 09/29/2018, - 08/31/2018, - 08/16/2018, - 05/26/2018, - 04/10/2018 Gross hematuria - 12/07/2018, Gross hematuria, - 2014/05/29 Primary hypogonadism - 09/29/2018, - 08/16/2018, - 05/26/2018, - 04/10/2018, - 02/13/2018 Flank Pain (Stable) - 08/31/2018, Left, - 02/28/2018, Generalized abdominal pain, - 05-28-12 Ureteral stricture, Left - 02/28/2018, 05/28/2016, Ureteral Stricture, - May 28, 2012 LLQ pain (Stable) - 02/13/2018, Abdominal Pain In The Left Lower Belly (LLQ), - 05/28/2012 Prostate Cancer - 02/13/2018, - 11/15/2017, - 05/17/2017, - 11/12/2016, - 09/20/2016, 28-May-2016, May 28, 2016, May 29, 2015, Prostate cancer, - May 29, 2015 Bladder Cancer Posterior - 11/15/2017, - 05/17/2017, - 11/15/2016 Bladder Cancer Lateral - 28-May-2016, 05/28/16, 29-May-2015 Ureteral obstruction - May 28, 2016 Abdominal Pain Unspec (Acute), Left, Ketorolac 30 mg IM. No bilateral renal or ureteral calculus noted. No bilateral hydronephrosis but does have dilated left ureter to level of bladder. No stone seen along ureter. No stranding along colon. It appears he has recently passed stone. Ketorolac 10 mg 1 po Q6 hrs prn - May 28, 2016 Bladder Cancer overlapping sites, Malignant neoplasm of overlapping sites of bladder - 05-29-15 BPH w/LUTS, Benign prostatic hyperplasia with urinary obstruction - 05-29-2014 Urinary Frequency, Increased urinary frequency - 05-29-14 Oth GU systems Signs/Symptoms, Bladder pain - 2014/05/29 Cystitis glandularis (w/o hematuria), BCG cystitis - 29-May-2014 Urinary Tract Inf, Unspec site, Pyuria - May 29, 2014, Urinary tract infection, - 05/29/14 Dysuria, Dysuria - 2014/05/29 Bladder tumor/neoplasm, Neoplasm of uncertain behavior of bladder - 05-29-14 Renal calculus, Nephrolithiasis - 05-28-2013 Hematuria, Unspec, Blood in urine - May 28, 2012 History of  urolithiasis, Nephrolithiasis - 05-28-12 Neoplasm of unspecified behavior of unspecified kidney, Renal neoplasm - 05/28/2012 Obstructive and reflux uropathy, Unspec, Obstructive uropathy - 05-28-2012 Other microscopic hematuria, Microscopic hematuria - 05-28-12, Microscopic Hematuria, - 05/28/12 Prostate nodule w/o LUTS, Nodular prostate without lower urinary tract symptoms - 28-May-2012 Prostate, Neoplasm of uncertain behavior, Neoplasm of uncertain behavior of prostate - 28-May-2012      PMH Notes:  2010-08-03 08:55:14 - Note: Nephrolithiasis Of The Left Kidney     NON-GU PMH: Hypogonadotropic hypogonadism - 11/15/2017 Encounter for general adult medical examination without abnormal findings, Encounter for preventive health examination - May 29, 2015    FAMILY HISTORY: Death In The Family Father - Runs In Family Death In The Family Mother - Runs In Family No pertinent family history - Other   SOCIAL HISTORY: Marital Status: Married Preferred Language: English; Race: White Current Smoking Status: Patient has never smoked.  Drinks 1 drinks per day.  Does not use drugs. Drinks 2 caffeinated drinks per day. Has not had a blood transfusion. Patient's occupation Architect.    REVIEW OF SYSTEMS:    GU Review Male:   Patient reports trouble starting your stream and have to strain to urinate . Patient denies frequent urination, hard to postpone urination, burning/ pain with urination, get up at night to urinate, leakage of urine, stream starts and  stops, erection problems, and penile pain.  Gastrointestinal (Upper):   Patient denies nausea, vomiting, and indigestion/ heartburn.  Gastrointestinal (Lower):   Patient denies diarrhea and constipation.  Constitutional:   Patient denies fever, night sweats, weight loss, and fatigue.  Skin:   Patient denies skin rash/ lesion and itching.  Eyes:   Patient denies blurred vision and double vision.  Ears/ Nose/ Throat:   Patient denies sore throat and sinus problems.   Hematologic/Lymphatic:   Patient denies easy bruising and swollen glands.  Cardiovascular:   Patient denies leg swelling and chest pains.  Respiratory:   Patient denies cough and shortness of breath.  Endocrine:   Patient denies excessive thirst.  Musculoskeletal:   Patient reports back pain. Patient denies joint pain.  Neurological:   Patient denies headaches and dizziness.  Psychologic:   Patient denies depression and anxiety.   VITAL SIGNS:      03/19/2019 09:14 AM  BP 142/75 mmHg  Pulse 66 /min  Temperature 97.3 F / 36.2 C   GU PHYSICAL EXAMINATION:    Scrotum: No lesions. No edema. No cysts. No warts.  Urethral Meatus: Normal size. No lesion, no wart, no discharge, no polyp. Normal location.  Penis: Circumcised, no warts, no cracks. No dorsal Peyronie's plaques, no left corporal Peyronie's plaques, no right corporal Peyronie's plaques, no scarring, no warts. No balanitis, no meatal stenosis.   MULTI-SYSTEM PHYSICAL EXAMINATION:    Constitutional: Well-nourished. No physical deformities. Normally developed. Good grooming.  Neck: Neck symmetrical, not swollen. Normal tracheal position.  Respiratory: No labored breathing, no use of accessory muscles.   Cardiovascular: Normal temperature, normal extremity pulses, no swelling, no varicosities.  Skin: No paleness, no jaundice, no cyanosis. No lesion, no ulcer, no rash.  Neurologic / Psychiatric: Oriented to time, oriented to place, oriented to person. No depression, no anxiety, no agitation.  Gastrointestinal: No mass, no tenderness, no rigidity, non obese abdomen.     PAST DATA REVIEWED:  Source Of History:  Patient  Lab Test Review:   BUN/Creatinine  Records Review:   Previous Hospital Records   09/27/18 06/30/18 05/16/18 02/02/18 11/08/17 05/12/17 07/07/16 11/12/15  PSA  Total PSA 0.051 ng/mL 0.016 ng/mL <0.015 ng/mL <0.015 ng/mL <0.015 ng/mL 0.023 ng/mL 3.52 ng/dl 3.90 ng/dl    09/27/18 06/30/18 05/16/18 03/17/18 02/02/18  11/08/17 05/12/17  Hormones  Testosterone, Total 513.7 ng/dL 636.0 ng/dL 27.5 ng/dL 27.1 ng/dL 31.4 ng/dL 29.8 ng/dL 12.2 ng/dL    PROCEDURES:         PVR Ultrasound - 51798  Scanned Volume: 470 cc         Cath Irrigation - 51700 A 20 Fr catheter was placed and clots irrigated from the bladder. 650 ml obtained. A few small clots were evacuated until clear. The catheter was removed.    ASSESSMENT:      ICD-10 Details  1 GU:   UPJ obstruction (congenital) - Q62.11 Chronic, Worsening - discussed with patient the nature r/b/a to cystoscopy/left RGP/Left URS/stent. All questions answered. he elects to proceed. he said to just "take the kidney out" and we discussed possibility of nephrectomy.   2   Gross hematuria - 123456 Acute, Uncomplicated - Eval as above. No significant bleeding today. Hct was normal.   3   Incomplete bladder emptying - 99991111 Acute, Uncomplicated - catheter removed.    PLAN:           Schedule Return Visit/Planned Activity: ASAP - Schedule Surgery  Document Letter(s):  Created for Patient: Clinical Summary         Next Appointment:      Next Appointment: 07/16/2019 10:00 AM    Appointment Type: Laboratory Appointment    Location: [facility] [phone]    Provider: Lab LAB    Reason for Visit: 6 mo PSA/Total T/Bun/Creatinine and H\T\H      * Signed by [name], M.D. on 03/19/19 at 5:05 PM (EST)*     The information contained in this medical record document is considered private and confidential patient information. This information can only be used for the medical diagnosis and/or medical services that are being provided by the patient's selected caregivers. This information can only be distributed outside of the patient's care if the patient agrees and signs waivers of authorization for this information to be sent to an outside source or route.

## 2019-03-24 NOTE — Anesthesia Procedure Notes (Signed)
Procedure Name: LMA Insertion Date/Time: 03/24/2019 8:37 AM Performed by: Lollie Sails, CRNA Pre-anesthesia Checklist: Patient identified, Emergency Drugs available, Suction available, Patient being monitored and Timeout performed Patient Re-evaluated:Patient Re-evaluated prior to induction Oxygen Delivery Method: Circle system utilized Preoxygenation: Pre-oxygenation with 100% oxygen Induction Type: IV induction LMA: LMA inserted LMA Size: 4.0 Number of attempts: 1 Placement Confirmation: positive ETCO2 and breath sounds checked- equal and bilateral Tube secured with: Tape Dental Injury: Teeth and Oropharynx as per pre-operative assessment

## 2019-03-24 NOTE — Interval H&P Note (Signed)
History and Physical Interval Note:  03/24/2019 8:27 AM  Charles Wright  has presented today for surgery, with the diagnosis of LEFT URETEROPELVIC JUNCTION OBSTRUCTION, GROSS HEMATURIA.  The various methods of treatment have been discussed with the patient and family. After consideration of risks, benefits and other options for treatment, the patient has consented to  Procedure(s): CYSTOSCOPY WITH RETROGRADE PYELOGRAM/URETERAL STENT PLACEMENT (Left) as a surgical intervention.  The patient's history has been reviewed, patient examined, no change in status, stable for surgery.  We discussed again the rationale for a staged procedure (he thought he may be getting a left nephrectomy today) and that he may need a staged procedure (pre-stent) to fully eval the upper tract on the left. Continues with henaturia but voiding OK. No fever or dysuria. Mild left flank pain. I have reviewed the patient's chart and labs.  Questions were answered to the patient's satisfaction.     [name]

## 2019-03-24 NOTE — Anesthesia Preprocedure Evaluation (Addendum)
Anesthesia Evaluation  Patient identified by MRN, date of birth, ID band Patient awake    Reviewed: Allergy & Precautions, NPO status , Patient's Chart, lab work & pertinent test results, reviewed documented beta blocker date and time   Airway Mallampati: II  TM Distance: >3 FB Neck ROM: Full    Dental  (+) Dental Advisory Given   Pulmonary neg pulmonary ROS, sleep apnea ,    Pulmonary exam normal breath sounds clear to auscultation       Cardiovascular hypertension, Pt. on medications and Pt. on home beta blockers + CAD  Normal cardiovascular exam Rhythm:Regular Rate:Normal     Neuro/Psych negative neurological ROS  negative psych ROS   GI/Hepatic negative GI ROS, Neg liver ROS, GERD  ,  Endo/Other  negative endocrine ROS  Renal/GU negative Renal ROS     Musculoskeletal  (+) Arthritis ,   Abdominal   Peds  Hematology negative hematology ROS (+)   Anesthesia Other Findings Day of surgery medications reviewed with the patient.  Reproductive/Obstetrics                            Anesthesia Physical Anesthesia Plan  ASA: III  Anesthesia Plan: General   Post-op Pain Management:    Induction: Intravenous  PONV Risk Score and Plan: 3 and Ondansetron, Dexamethasone and Treatment may vary due to age or medical condition  Airway Management Planned: LMA  Additional Equipment: None  Intra-op Plan:   Post-operative Plan: Extubation in OR  Informed Consent: I have reviewed the patients History and Physical, chart, labs and discussed the procedure including the risks, benefits and alternatives for the proposed anesthesia with the patient or authorized representative who has indicated his/her understanding and acceptance.     Dental advisory given  Plan Discussed with: CRNA  Anesthesia Plan Comments:         Anesthesia Quick Evaluation

## 2019-03-24 NOTE — Anesthesia Postprocedure Evaluation (Signed)
Anesthesia Post Note  Patient: Charles Wright  Procedure(s) Performed: CYSTOSCOPY WITH RETROGRADE PYELOGRAM/URETERAL STENT PLACEMENT ureteroscopy biopsy of neoplasm (Left Ureter)     Patient location during evaluation: PACU Anesthesia Type: General Level of consciousness: sedated and patient cooperative Pain management: pain level controlled Vital Signs Assessment: post-procedure vital signs reviewed and stable Respiratory status: spontaneous breathing Cardiovascular status: stable Anesthetic complications: no    Last Vitals:  Vitals:   03/24/19 1100 03/24/19 1130  BP: (!) 148/81 (!) 142/83  Pulse: 71   Resp: 15 16  Temp: 36.6 C 36.6 C  SpO2: 99% 96%    Last Pain:  Vitals:   03/24/19 1012  TempSrc:   PainSc: 0-No pain                 Nolon Nations

## 2019-03-25 LAB — URINE CULTURE: Culture: NO GROWTH

## 2019-03-26 ENCOUNTER — Other Ambulatory Visit: Payer: Self-pay | Admitting: Urology

## 2019-03-26 ENCOUNTER — Encounter: Payer: Self-pay | Admitting: *Deleted

## 2019-03-26 DIAGNOSIS — D4112 Neoplasm of uncertain behavior of left renal pelvis: Secondary | ICD-10-CM | POA: Diagnosis not present

## 2019-03-26 DIAGNOSIS — Q6211 Congenital occlusion of ureteropelvic junction: Secondary | ICD-10-CM | POA: Diagnosis not present

## 2019-03-26 NOTE — Addendum Note (Signed)
Addended by: [name] R on: 03/26/2019 11:37 AM   Modules accepted: Orders

## 2019-03-26 NOTE — Addendum Note (Signed)
Addended by: [name] R on: 03/26/2019 11:31 AM   Modules accepted: Level of Service

## 2019-03-26 NOTE — Progress Notes (Signed)
Cytology order for specimen on 03/24/2019

## 2019-03-26 NOTE — Addendum Note (Signed)
Addended by: [name] R on: 03/26/2019 11:31 AM   Modules accepted: Orders

## 2019-03-28 LAB — SURGICAL PATHOLOGY

## 2019-03-28 LAB — CYTOLOGY - NON PAP

## 2019-04-03 DIAGNOSIS — D4112 Neoplasm of uncertain behavior of left renal pelvis: Secondary | ICD-10-CM | POA: Diagnosis not present

## 2019-04-03 DIAGNOSIS — R109 Unspecified abdominal pain: Secondary | ICD-10-CM | POA: Diagnosis not present

## 2019-04-05 ENCOUNTER — Ambulatory Visit: Payer: PPO | Attending: Internal Medicine

## 2019-04-05 DIAGNOSIS — Z23 Encounter for immunization: Secondary | ICD-10-CM

## 2019-04-05 NOTE — Progress Notes (Signed)
   Covid-19 Vaccination Clinic  Name:  Charles Wright    MRN: [id number] DOB: 01/23/1936  04/05/2019  Mr. Betton was observed post Covid-19 immunization for 15 minutes without incidence. He was provided with Vaccine Information Sheet and instruction to access the V-Safe system.   Mr. Wykle was instructed to call 911 with any severe reactions post vaccine: Marland Kitchen Difficulty breathing  . Swelling of your face and throat  . A fast heartbeat  . A bad rash all over your body  . Dizziness and weakness    Immunizations Administered    Name Date Dose VIS Date Route   Pfizer COVID-19 Vaccine 04/05/2019 12:15 PM 0.3 mL 01/19/2019 Intramuscular   Manufacturer: [place]   Lot: [id number]   [place]: [id number]

## 2019-04-07 ENCOUNTER — Observation Stay (HOSPITAL_COMMUNITY)
Admission: EM | Admit: 2019-04-07 | Discharge: 2019-04-09 | Disposition: A | Discharge status: Home / Self Care | Payer: PPO | Attending: Urology | Admitting: Urology

## 2019-04-07 ENCOUNTER — Other Ambulatory Visit: Payer: Self-pay

## 2019-04-07 DIAGNOSIS — Z923 Personal history of irradiation: Secondary | ICD-10-CM | POA: Insufficient documentation

## 2019-04-07 DIAGNOSIS — I252 Old myocardial infarction: Secondary | ICD-10-CM | POA: Insufficient documentation

## 2019-04-07 DIAGNOSIS — Z8551 Personal history of malignant neoplasm of bladder: Secondary | ICD-10-CM | POA: Insufficient documentation

## 2019-04-07 DIAGNOSIS — I480 Paroxysmal atrial fibrillation: Secondary | ICD-10-CM | POA: Diagnosis not present

## 2019-04-07 DIAGNOSIS — E785 Hyperlipidemia, unspecified: Secondary | ICD-10-CM | POA: Diagnosis not present

## 2019-04-07 DIAGNOSIS — R338 Other retention of urine: Secondary | ICD-10-CM

## 2019-04-07 DIAGNOSIS — N401 Enlarged prostate with lower urinary tract symptoms: Secondary | ICD-10-CM | POA: Insufficient documentation

## 2019-04-07 DIAGNOSIS — Z79899 Other long term (current) drug therapy: Secondary | ICD-10-CM | POA: Diagnosis not present

## 2019-04-07 DIAGNOSIS — Z8546 Personal history of malignant neoplasm of prostate: Secondary | ICD-10-CM | POA: Diagnosis not present

## 2019-04-07 DIAGNOSIS — I10 Essential (primary) hypertension: Secondary | ICD-10-CM | POA: Insufficient documentation

## 2019-04-07 DIAGNOSIS — R339 Retention of urine, unspecified: Secondary | ICD-10-CM | POA: Diagnosis not present

## 2019-04-07 DIAGNOSIS — Z87442 Personal history of urinary calculi: Secondary | ICD-10-CM | POA: Insufficient documentation

## 2019-04-07 DIAGNOSIS — I2510 Atherosclerotic heart disease of native coronary artery without angina pectoris: Secondary | ICD-10-CM | POA: Diagnosis not present

## 2019-04-07 DIAGNOSIS — R310 Gross hematuria: Secondary | ICD-10-CM | POA: Diagnosis not present

## 2019-04-07 DIAGNOSIS — K219 Gastro-esophageal reflux disease without esophagitis: Secondary | ICD-10-CM | POA: Insufficient documentation

## 2019-04-07 DIAGNOSIS — Z03818 Encounter for observation for suspected exposure to other biological agents ruled out: Secondary | ICD-10-CM | POA: Diagnosis not present

## 2019-04-07 DIAGNOSIS — Z79890 Hormone replacement therapy (postmenopausal): Secondary | ICD-10-CM | POA: Diagnosis not present

## 2019-04-07 DIAGNOSIS — N1330 Unspecified hydronephrosis: Secondary | ICD-10-CM | POA: Diagnosis not present

## 2019-04-07 DIAGNOSIS — Z7901 Long term (current) use of anticoagulants: Secondary | ICD-10-CM | POA: Diagnosis not present

## 2019-04-07 DIAGNOSIS — Z20822 Contact with and (suspected) exposure to covid-19: Secondary | ICD-10-CM | POA: Diagnosis not present

## 2019-04-07 DIAGNOSIS — Z7982 Long term (current) use of aspirin: Secondary | ICD-10-CM | POA: Insufficient documentation

## 2019-04-07 DIAGNOSIS — X58XXXA Exposure to other specified factors, initial encounter: Secondary | ICD-10-CM | POA: Diagnosis not present

## 2019-04-07 DIAGNOSIS — S3730XA Unspecified injury of urethra, initial encounter: Secondary | ICD-10-CM | POA: Insufficient documentation

## 2019-04-07 DIAGNOSIS — G4733 Obstructive sleep apnea (adult) (pediatric): Secondary | ICD-10-CM | POA: Insufficient documentation

## 2019-04-08 ENCOUNTER — Other Ambulatory Visit: Payer: Self-pay

## 2019-04-08 ENCOUNTER — Observation Stay (HOSPITAL_COMMUNITY): Payer: PPO

## 2019-04-08 ENCOUNTER — Encounter (HOSPITAL_COMMUNITY)
Admission: EM | Disposition: A | Discharge status: Home / Self Care | Payer: Self-pay | Source: Home / Self Care | Attending: Emergency Medicine

## 2019-04-08 ENCOUNTER — Encounter (HOSPITAL_COMMUNITY): Payer: Self-pay | Admitting: Urology

## 2019-04-08 ENCOUNTER — Observation Stay (HOSPITAL_COMMUNITY): Payer: PPO | Admitting: Anesthesiology

## 2019-04-08 DIAGNOSIS — N3289 Other specified disorders of bladder: Secondary | ICD-10-CM | POA: Diagnosis not present

## 2019-04-08 DIAGNOSIS — Z79899 Other long term (current) drug therapy: Secondary | ICD-10-CM | POA: Diagnosis not present

## 2019-04-08 DIAGNOSIS — I252 Old myocardial infarction: Secondary | ICD-10-CM | POA: Diagnosis not present

## 2019-04-08 DIAGNOSIS — S37829A Unspecified injury of prostate, initial encounter: Secondary | ICD-10-CM | POA: Diagnosis not present

## 2019-04-08 DIAGNOSIS — Z7901 Long term (current) use of anticoagulants: Secondary | ICD-10-CM | POA: Diagnosis not present

## 2019-04-08 DIAGNOSIS — I10 Essential (primary) hypertension: Secondary | ICD-10-CM | POA: Diagnosis not present

## 2019-04-08 DIAGNOSIS — Z923 Personal history of irradiation: Secondary | ICD-10-CM | POA: Diagnosis not present

## 2019-04-08 DIAGNOSIS — Z20822 Contact with and (suspected) exposure to covid-19: Secondary | ICD-10-CM | POA: Diagnosis not present

## 2019-04-08 DIAGNOSIS — R310 Gross hematuria: Secondary | ICD-10-CM | POA: Diagnosis not present

## 2019-04-08 DIAGNOSIS — D418 Neoplasm of uncertain behavior of other specified urinary organs: Secondary | ICD-10-CM | POA: Diagnosis not present

## 2019-04-08 DIAGNOSIS — I2510 Atherosclerotic heart disease of native coronary artery without angina pectoris: Secondary | ICD-10-CM | POA: Diagnosis not present

## 2019-04-08 DIAGNOSIS — N2889 Other specified disorders of kidney and ureter: Secondary | ICD-10-CM | POA: Diagnosis not present

## 2019-04-08 DIAGNOSIS — S3730XA Unspecified injury of urethra, initial encounter: Secondary | ICD-10-CM | POA: Diagnosis not present

## 2019-04-08 DIAGNOSIS — Z8551 Personal history of malignant neoplasm of bladder: Secondary | ICD-10-CM | POA: Diagnosis not present

## 2019-04-08 DIAGNOSIS — R338 Other retention of urine: Secondary | ICD-10-CM | POA: Diagnosis not present

## 2019-04-08 DIAGNOSIS — Z8546 Personal history of malignant neoplasm of prostate: Secondary | ICD-10-CM | POA: Diagnosis not present

## 2019-04-08 DIAGNOSIS — N1330 Unspecified hydronephrosis: Secondary | ICD-10-CM | POA: Diagnosis not present

## 2019-04-08 HISTORY — PX: CYSTOSCOPY W/ URETERAL STENT PLACEMENT: SHX1429

## 2019-04-08 LAB — POCT I-STAT EG7
Acid-base deficit: 2 mmol/L (ref 0.0–2.0)
Bicarbonate: 24.6 mmol/L (ref 20.0–28.0)
Calcium, Ion: 1.23 mmol/L (ref 1.15–1.40)
HCT: 30 % — ABNORMAL LOW (ref 39.0–52.0)
Hemoglobin: 10.2 g/dL — ABNORMAL LOW (ref 13.0–17.0)
O2 Saturation: 99 %
Potassium: 3.9 mmol/L (ref 3.5–5.1)
Sodium: 140 mmol/L (ref 135–145)
TCO2: 26 mmol/L (ref 22–32)
pCO2, Ven: 48.4 mmHg (ref 44.0–60.0)
pH, Ven: 7.315 (ref 7.250–7.430)
pO2, Ven: 171 mmHg — ABNORMAL HIGH (ref 32.0–45.0)

## 2019-04-08 LAB — URINALYSIS, ROUTINE W REFLEX MICROSCOPIC: Hgb urine dipstick: NEGATIVE

## 2019-04-08 LAB — RESPIRATORY PANEL BY RT PCR (FLU A&B, COVID)
Influenza A by PCR: NEGATIVE
Influenza B by PCR: NEGATIVE
SARS Coronavirus 2 by RT PCR: NEGATIVE

## 2019-04-08 LAB — BASIC METABOLIC PANEL
Anion gap: 8 (ref 5–15)
BUN: 23 mg/dL (ref 8–23)
CO2: 21 mmol/L — ABNORMAL LOW (ref 22–32)
Calcium: 8.8 mg/dL — ABNORMAL LOW (ref 8.9–10.3)
Chloride: 111 mmol/L (ref 98–111)
Creatinine, Ser: 1.43 mg/dL — ABNORMAL HIGH (ref 0.61–1.24)
GFR calc Af Amer: 52 mL/min — ABNORMAL LOW (ref >60)
GFR calc non Af Amer: 45 mL/min — ABNORMAL LOW (ref >60)
Glucose, Bld: 139 mg/dL — ABNORMAL HIGH (ref 70–99)
Potassium: 4 mmol/L (ref 3.5–5.1)
Sodium: 140 mmol/L (ref 135–145)

## 2019-04-08 LAB — URINALYSIS, MICROSCOPIC (REFLEX)
RBC / HPF: >50 RBC/hpf (ref 0–5)
Squamous Epithelial / HPF: NONE SEEN (ref 0–5)

## 2019-04-08 LAB — SURGICAL PCR SCREEN
MRSA, PCR: NEGATIVE
Staphylococcus aureus: NEGATIVE

## 2019-04-08 LAB — HEMOGLOBIN AND HEMATOCRIT, BLOOD
HCT: 35.8 % — ABNORMAL LOW (ref 39.0–52.0)
Hemoglobin: 11.6 g/dL — ABNORMAL LOW (ref 13.0–17.0)

## 2019-04-08 SURGERY — CYSTOSCOPY, WITH RETROGRADE PYELOGRAM AND URETERAL STENT INSERTION
Anesthesia: General | Site: Bladder | Laterality: Left

## 2019-04-08 MED ORDER — FENTANYL CITRATE (PF) 250 MCG/5ML IJ SOLN
INTRAMUSCULAR | Status: DC | PRN
  Administered 2019-04-08: 25 ug via INTRAVENOUS
  Administered 2019-04-08: 50 ug via INTRAVENOUS
  Administered 2019-04-08 (×5): 25 ug via INTRAVENOUS

## 2019-04-08 MED ORDER — DEXAMETHASONE SODIUM PHOSPHATE 10 MG/ML IJ SOLN
INTRAMUSCULAR | Status: AC
  Filled 2019-04-08: qty 1

## 2019-04-08 MED ORDER — SUGAMMADEX SODIUM 200 MG/2ML IV SOLN
INTRAVENOUS | Status: DC | PRN
  Administered 2019-04-08: 200 mg via INTRAVENOUS

## 2019-04-08 MED ORDER — ACETAMINOPHEN 10 MG/ML IV SOLN
INTRAVENOUS | Status: AC
  Filled 2019-04-08: qty 100

## 2019-04-08 MED ORDER — LACTATED RINGERS IV SOLN: INTRAVENOUS | Status: DC

## 2019-04-08 MED ORDER — LIDOCAINE HCL URETHRAL/MUCOSAL 2 % EX GEL
CUTANEOUS | Status: AC
  Filled 2019-04-08: qty 30

## 2019-04-08 MED ORDER — FENTANYL CITRATE (PF) 100 MCG/2ML IJ SOLN
INTRAMUSCULAR | Status: AC
  Filled 2019-04-08: qty 2

## 2019-04-08 MED ORDER — CIPROFLOXACIN IN D5W 400 MG/200ML IV SOLN
INTRAVENOUS | Status: AC
  Filled 2019-04-08: qty 200

## 2019-04-08 MED ORDER — SODIUM CHLORIDE 0.9 % IV SOLN
1.0000 g | INTRAVENOUS | Status: AC
  Administered 2019-04-08: 05:00:00 1 g via INTRAVENOUS
  Filled 2019-04-08: qty 10

## 2019-04-08 MED ORDER — PHENYLEPHRINE 40 MCG/ML (10ML) SYRINGE FOR IV PUSH (FOR BLOOD PRESSURE SUPPORT)
PREFILLED_SYRINGE | INTRAVENOUS | Status: AC
  Filled 2019-04-08: qty 10

## 2019-04-08 MED ORDER — LEVOTHYROXINE SODIUM 50 MCG PO TABS
50.0000 ug | ORAL_TABLET | Freq: Every day | ORAL | Status: DC
  Administered 2019-04-09: 50 ug via ORAL
  Filled 2019-04-08 (×2): qty 1

## 2019-04-08 MED ORDER — HYDROCODONE-ACETAMINOPHEN 5-325 MG PO TABS
2.0000 | ORAL_TABLET | Freq: Four times a day (QID) | ORAL | Status: DC | PRN
  Administered 2019-04-08 – 2019-04-09 (×3): 2 via ORAL
  Filled 2019-04-08 (×3): qty 2

## 2019-04-08 MED ORDER — FENTANYL CITRATE (PF) 100 MCG/2ML IJ SOLN
100.0000 ug | Freq: Once | INTRAMUSCULAR | Status: AC
  Administered 2019-04-08: 03:00:00 100 ug via INTRAVENOUS
  Filled 2019-04-08: qty 2

## 2019-04-08 MED ORDER — ONDANSETRON HCL 4 MG/2ML IJ SOLN: 4.0000 mg | Freq: Once | INTRAMUSCULAR | Status: DC | PRN

## 2019-04-08 MED ORDER — ONDANSETRON HCL 4 MG/2ML IJ SOLN
INTRAMUSCULAR | Status: AC
  Filled 2019-04-08: qty 2

## 2019-04-08 MED ORDER — ISOSORBIDE MONONITRATE ER 30 MG PO TB24
30.0000 mg | ORAL_TABLET | Freq: Every day | ORAL | Status: DC
  Administered 2019-04-08 – 2019-04-09 (×2): 30 mg via ORAL
  Filled 2019-04-08 (×3): qty 1

## 2019-04-08 MED ORDER — PROPOFOL 10 MG/ML IV BOLUS
INTRAVENOUS | Status: AC
  Filled 2019-04-08: qty 20

## 2019-04-08 MED ORDER — PRAVASTATIN SODIUM 40 MG PO TABS
40.0000 mg | ORAL_TABLET | Freq: Every day | ORAL | Status: DC
  Administered 2019-04-08 – 2019-04-09 (×2): 40 mg via ORAL
  Filled 2019-04-08 (×2): qty 1

## 2019-04-08 MED ORDER — ALLOPURINOL 300 MG PO TABS
300.0000 mg | ORAL_TABLET | Freq: Every morning | ORAL | Status: DC
  Administered 2019-04-08 – 2019-04-09 (×2): 300 mg via ORAL
  Filled 2019-04-08 (×3): qty 1

## 2019-04-08 MED ORDER — OXYCODONE HCL 5 MG/5ML PO SOLN: 5.0000 mg | Freq: Once | ORAL | Status: DC | PRN

## 2019-04-08 MED ORDER — PROPOFOL 10 MG/ML IV BOLUS
INTRAVENOUS | Status: DC | PRN
  Administered 2019-04-08: 140 mg via INTRAVENOUS

## 2019-04-08 MED ORDER — ASPIRIN EC 81 MG PO TBEC
81.0000 mg | DELAYED_RELEASE_TABLET | Freq: Every day | ORAL | Status: DC
  Administered 2019-04-08 – 2019-04-09 (×2): 81 mg via ORAL
  Filled 2019-04-08 (×2): qty 1

## 2019-04-08 MED ORDER — ROCURONIUM BROMIDE 10 MG/ML (PF) SYRINGE
PREFILLED_SYRINGE | INTRAVENOUS | Status: DC | PRN
  Administered 2019-04-08: 50 mg via INTRAVENOUS
  Administered 2019-04-08: 10 mg via INTRAVENOUS

## 2019-04-08 MED ORDER — ACETAMINOPHEN 10 MG/ML IV SOLN
1000.0000 mg | Freq: Once | INTRAVENOUS | Status: AC
  Administered 2019-04-08: 16:00:00 1000 mg via INTRAVENOUS

## 2019-04-08 MED ORDER — DEXAMETHASONE SODIUM PHOSPHATE 10 MG/ML IJ SOLN
INTRAMUSCULAR | Status: DC | PRN
  Administered 2019-04-08: 8 mg via INTRAVENOUS

## 2019-04-08 MED ORDER — LACTATED RINGERS IV SOLN: INTRAVENOUS | Status: DC | PRN

## 2019-04-08 MED ORDER — SODIUM CHLORIDE 0.9 % IR SOLN
3000.0000 mL | Status: DC
  Administered 2019-04-08 – 2019-04-09 (×10): 3000 mL

## 2019-04-08 MED ORDER — MORPHINE SULFATE (PF) 4 MG/ML IV SOLN
4.0000 mg | Freq: Once | INTRAVENOUS | Status: AC
  Administered 2019-04-08: 06:00:00 4 mg via INTRAVENOUS
  Filled 2019-04-08: qty 1

## 2019-04-08 MED ORDER — PHENYLEPHRINE 40 MCG/ML (10ML) SYRINGE FOR IV PUSH (FOR BLOOD PRESSURE SUPPORT)
PREFILLED_SYRINGE | INTRAVENOUS | Status: DC | PRN
  Administered 2019-04-08 (×3): 100 ug via INTRAVENOUS
  Administered 2019-04-08: 120 ug via INTRAVENOUS
  Administered 2019-04-08: 100 ug via INTRAVENOUS
  Administered 2019-04-08: 120 ug via INTRAVENOUS

## 2019-04-08 MED ORDER — FENTANYL CITRATE (PF) 100 MCG/2ML IJ SOLN
25.0000 ug | INTRAMUSCULAR | Status: DC | PRN
  Administered 2019-04-08: 16:00:00 25 ug via INTRAVENOUS
  Administered 2019-04-08: 16:00:00 50 ug via INTRAVENOUS
  Administered 2019-04-08: 16:00:00 25 ug via INTRAVENOUS

## 2019-04-08 MED ORDER — SODIUM CHLORIDE 0.9 % IV SOLN: INTRAVENOUS | Status: DC

## 2019-04-08 MED ORDER — 0.9 % SODIUM CHLORIDE (POUR BTL) OPTIME
TOPICAL | Status: DC | PRN
  Administered 2019-04-08: 1000 mL

## 2019-04-08 MED ORDER — SODIUM CHLORIDE 0.9 % IR SOLN
Status: DC | PRN
  Administered 2019-04-08: 12000 mL

## 2019-04-08 MED ORDER — NITROGLYCERIN 0.4 MG SL SUBL: 0.4000 mg | SUBLINGUAL_TABLET | SUBLINGUAL | Status: DC | PRN

## 2019-04-08 MED ORDER — HYDROCODONE-ACETAMINOPHEN 5-325 MG PO TABS
1.0000 | ORAL_TABLET | Freq: Four times a day (QID) | ORAL | Status: DC | PRN
  Administered 2019-04-08: 07:00:00 1 via ORAL
  Filled 2019-04-08: qty 1

## 2019-04-08 MED ORDER — SUGAMMADEX SODIUM 500 MG/5ML IV SOLN
INTRAVENOUS | Status: AC
  Filled 2019-04-08: qty 5

## 2019-04-08 MED ORDER — BELLADONNA ALKALOIDS-OPIUM 16.2-30 MG RE SUPP
RECTAL | Status: AC
  Filled 2019-04-08: qty 1

## 2019-04-08 MED ORDER — BELLADONNA ALKALOIDS-OPIUM 16.2-60 MG RE SUPP
1.0000 | Freq: Once | RECTAL | Status: AC
  Administered 2019-04-08: 06:00:00 1 via RECTAL
  Filled 2019-04-08: qty 1

## 2019-04-08 MED ORDER — IOHEXOL 300 MG/ML  SOLN
INTRAMUSCULAR | Status: DC | PRN
  Administered 2019-04-08: 50 mL

## 2019-04-08 MED ORDER — ONDANSETRON HCL 4 MG/2ML IJ SOLN
INTRAMUSCULAR | Status: DC | PRN
  Administered 2019-04-08: 4 mg via INTRAVENOUS

## 2019-04-08 MED ORDER — CIPROFLOXACIN IN D5W 400 MG/200ML IV SOLN
400.0000 mg | Freq: Once | INTRAVENOUS | Status: AC
  Administered 2019-04-08: 12:00:00 400 mg via INTRAVENOUS

## 2019-04-08 MED ORDER — CHLORHEXIDINE GLUCONATE CLOTH 2 % EX PADS
6.0000 | MEDICATED_PAD | Freq: Every day | CUTANEOUS | Status: DC
  Administered 2019-04-09: 6 via TOPICAL

## 2019-04-08 MED ORDER — BELLADONNA ALKALOIDS-OPIUM 16.2-30 MG RE SUPP
RECTAL | Status: DC | PRN
  Administered 2019-04-08: 30 mg via RECTAL

## 2019-04-08 MED ORDER — METOPROLOL TARTRATE 50 MG PO TABS
50.0000 mg | ORAL_TABLET | Freq: Two times a day (BID) | ORAL | Status: DC
  Administered 2019-04-08 – 2019-04-09 (×3): 50 mg via ORAL
  Filled 2019-04-08 (×3): qty 1

## 2019-04-08 MED ORDER — OXYCODONE HCL 5 MG PO TABS: 5.0000 mg | ORAL_TABLET | Freq: Once | ORAL | Status: DC | PRN

## 2019-04-08 SURGICAL SUPPLY — 24 items
BAG URINE DRAIN 2000ML AR STRL (UROLOGICAL SUPPLIES) ×3 IMPLANT
BAG URO CATCHER STRL LF (MISCELLANEOUS) ×3 IMPLANT
BASKET STNLS GEMINI 4WIRE 3FR (BASKET) ×3 IMPLANT
BASKET ZERO TIP NITINOL 2.4FR (BASKET) IMPLANT
BSKT STON RTRVL ZERO TP 2.4FR (BASKET)
CATH FOLEY 3WAY 30CC 22FR (CATHETERS) ×3 IMPLANT
CATH INTERMIT  6FR 70CM (CATHETERS) ×3 IMPLANT
CLOTH BEACON ORANGE TIMEOUT ST (SAFETY) ×3 IMPLANT
GLOVE BIOGEL M STRL SZ7.5 (GLOVE) ×3 IMPLANT
GOWN STRL REUS W/TWL XL LVL3 (GOWN DISPOSABLE) ×3 IMPLANT
GUIDEWIRE ANG ZIPWIRE 038X150 (WIRE) ×3 IMPLANT
GUIDEWIRE STR DUAL SENSOR (WIRE) ×6 IMPLANT
KIT TURNOVER KIT A (KITS) IMPLANT
MANIFOLD NEPTUNE II (INSTRUMENTS) ×3 IMPLANT
PACK CYSTO (CUSTOM PROCEDURE TRAY) ×3 IMPLANT
PLUG CATH AND CAP STER (CATHETERS) ×3 IMPLANT
SHEATH URETERAL 12FRX35CM (MISCELLANEOUS) ×3 IMPLANT
STENT POLARIS LOOP 8FR X 26 CM (STENTS) ×3 IMPLANT
SYR TOOMEY IRRIG 70ML (MISCELLANEOUS) ×3
SYRINGE TOOMEY IRRIG 70ML (MISCELLANEOUS) ×1 IMPLANT
TUBING CONNECTING 10 (TUBING) ×2 IMPLANT
TUBING CONNECTING 10' (TUBING) ×1
TUBING UROLOGY SET (TUBING) ×3 IMPLANT
WIRE COONS/BENSON .038X145CM (WIRE) ×3 IMPLANT

## 2019-04-08 NOTE — Progress Notes (Addendum)
Subjective: Patient reports pain in left kidney and bladder improved but bladder pain intense with clot obs of catheter or with irrigation. Nurses report catheter clotted and they had to flush.   Objective: Vital signs in last 24 hours: Temp:  [98.5 F (36.9 C)-99 F (37.2 C)] 99 F (37.2 C) (02/28 0657) Pulse Rate:  [77-92] 79 (02/28 0951) Resp:  [14-24] 17 (02/28 0657) BP: (106-166)/(63-89) 134/72 (02/28 0951) SpO2:  [92 %-100 %] 95 % (02/28 0759) Weight:  [94.3 kg-98.1 kg] 98.1 kg (02/28 0648)  Intake/Output from previous day: 02/27 0701 - 02/28 0700 In: 200 [IV Piggyback:100] Out: 400 [Urine:400] Intake/Output this shift: Total I/O In: -  Out: 675 [Urine:675]  Physical Exam:  NAD Alert and O x 3 CV - RRR Abd - soft, NT, not distended and guarding GU - foley in place - urine bloody and some clots - I irrigated and equal return but I can only get about 40 ml in and patient with terrible bladder spasm and pain. Urine clears quickly but flashes red.   Renal US - foley balloon in bladder, no obvious clot, dilation of left renal pelvis but not collecting system    Lab Results: Recent Labs    04/08/19 0443  HGB 11.6*  HCT 35.8*   BMET Recent Labs    04/08/19 0443  NA 140  K 4.0  CL 111  CO2 21*  GLUCOSE 139*  BUN 23  CREATININE 1.43*  CALCIUM 8.8*   No results for input(s): LABPT, INR in the last 72 hours. No results for input(s): LABURIN in the last 72 hours. Results for orders placed or performed during the hospital encounter of 04/07/19  Respiratory Panel by RT PCR (Flu A&B, Covid) - Nasopharyngeal Swab     Status: None   Collection Time: 04/08/19  5:25 AM   Specimen: Nasopharyngeal Swab  Result Value Ref Range Status   SARS Coronavirus 2 by RT PCR NEGATIVE NEGATIVE Final    Comment: (NOTE) SARS-CoV-2 target nucleic acids are NOT DETECTED. The SARS-CoV-2 RNA is generally detectable in upper respiratoy specimens during the acute phase of infection.  The lowest concentration of SARS-CoV-2 viral copies this assay can detect is 131 copies/mL. A negative result does not preclude SARS-Cov-2 infection and should not be used as the sole basis for treatment or other patient management decisions. A negative result may occur with  improper specimen collection/handling, submission of specimen other than nasopharyngeal swab, presence of viral mutation(s) within the areas targeted by this assay, and inadequate number of viral copies (<131 copies/mL). A negative result must be combined with clinical observations, patient history, and epidemiological information. The expected result is Negative. Fact Sheet for Patients:  PinkCheek.be Fact Sheet for Healthcare Providers:  GravelBags.it This test is not yet ap proved or cleared by the [place] FDA and  has been authorized for detection and/or diagnosis of SARS-CoV-2 by FDA under an Emergency Use Authorization (EUA). This EUA will remain  in effect (meaning this test can be used) for the duration of the COVID-19 declaration under Section 564(b)(1) of the Act, 21 U.S.C. section 360bbb-3(b)(1), unless the authorization is terminated or revoked sooner.    Influenza A by PCR NEGATIVE NEGATIVE Final   Influenza B by PCR NEGATIVE NEGATIVE Final    Comment: (NOTE) The Xpert Xpress SARS-CoV-2/FLU/RSV assay is intended as an aid in  the diagnosis of influenza from Nasopharyngeal swab specimens and  should not be used as a sole basis for treatment. Nasal washings  and  aspirates are unacceptable for Xpert Xpress SARS-CoV-2/FLU/RSV  testing. Fact Sheet for Patients: PinkCheek.be Fact Sheet for Healthcare Providers: GravelBags.it This test is not yet approved or cleared by the [place] FDA and  has been authorized for detection and/or diagnosis of SARS-CoV-2 by  FDA under an  Emergency Use Authorization (EUA). This EUA will remain  in effect (meaning this test can be used) for the duration of the  Covid-19 declaration under Section 564(b)(1) of the Act, 21  U.S.C. section 360bbb-3(b)(1), unless the authorization is  terminated or revoked. Performed at [facility], [place] [address], [place], [place] [address]     Studies/Results: US RENAL  Result Date: 04/08/2019 CLINICAL DATA:  84 year old male with gross hematuria. History of prostate and bladder cancer. EXAM: RENAL / URINARY TRACT ULTRASOUND COMPLETE COMPARISON:  12/13/2018 ultrasound and 03/18/2019 CT FINDINGS: Right Kidney: Renal measurements: 11.5 x 6 x 6.3 cm = volume: 224 mL . Echogenicity within normal limits. No mass or hydronephrosis visualized. Left Kidney: Renal measurements: 10.7 x 5.8 x 5.7 cm = volume: 184 mL. Moderate LEFT hydronephrosis is identified with echogenic material in the renal pelvis and collecting system. No discrete renal parenchymal mass is identified. Bladder: A Foley catheter is noted within collapsed bladder. Other: None. IMPRESSION: 1. Moderate LEFT hydronephrosis with echogenic material in the renal pelvis/collecting system which may represent clot versus mass. Degree of hydronephrosis appears similar to 03/18/2019 CT. 2. Unremarkable RIGHT kidney. 3. Foley catheter within a collapsed bladder. Electronically Signed   By: Margarette Canada M.D.   On: 04/08/2019 08:25    Assessment/Plan:  Gross hematuria - could be combination of prostate trauma and upper tract bleeding on left, but difficult to tell and catheter is not irrigating well. Not certain if that is from pt pain but I feel a look under anesthesia to assess the urethra, bladder and left system makes sense. We discussed I discussed with the patient the nature, potential benefits, risks and alternatives to cystoscopy, fulguration of bleeding, left stent exchange, including side effects of the proposed treatment, the  likelihood of the patient achieving the goals of the procedure, and any potential problems that might occur during the procedure or recuperation. We discussed alternative such as continued catheter drainage. We discussed the slight possibility of even needing an SP tube or open bladder repair and if that is the case we will also go ahead and proceed with left nephrectomy. Dr. Tresa Moore is back-up and discussed with him as well. Ideally, Dr. Tresa Moore still planning to see pt in outpt for planned lap robotic left Nx. All questions answered. Patient elects to proceed to OR. Foley clogged again earlier and pt was in a lot of pain.     LOS: 0 days   [name] 04/08/2019, 10:52 AM

## 2019-04-08 NOTE — ED Notes (Signed)
Patient has bloody urine leaking around catheter and is requesting medical advice. MD made aware. RN replacing catheter for a bigger size.

## 2019-04-08 NOTE — Anesthesia Procedure Notes (Signed)
Procedure Name: Intubation Date/Time: 04/08/2019 1:01 PM Performed by: Anne Fu, CRNA Pre-anesthesia Checklist: Patient identified, Emergency Drugs available, Suction available, Patient being monitored and Timeout performed Patient Re-evaluated:Patient Re-evaluated prior to induction Oxygen Delivery Method: Circle system utilized Preoxygenation: Pre-oxygenation with 100% oxygen Induction Type: IV induction Ventilation: Mask ventilation without difficulty Laryngoscope Size: Mac and 4 Grade View: Grade II Tube type: Oral Tube size: 7.5 mm Number of attempts: 1 Airway Equipment and Method: Stylet Placement Confirmation: ETT inserted through vocal cords under direct vision,  positive ETCO2 and breath sounds checked- equal and bilateral Secured at: 23 cm Tube secured with: Tape Dental Injury: Teeth and Oropharynx as per pre-operative assessment

## 2019-04-08 NOTE — ED Triage Notes (Addendum)
Per Pt: Patient has a c/o of urinary retention. Pt has not been able to urinate since 0900pm. Pt reports having blood clots in urine. Pt has had a urinary catheter previously and had it removed about two-three weeks ago. Scores pain a 10/10 in bladder and flank.

## 2019-04-08 NOTE — ED Notes (Signed)
Tried to place a coude and a three way foley and unsuccessful. Neither advanced.

## 2019-04-08 NOTE — H&P (Signed)
H&P  Chief Complaint: Gross hematuria  History of Present Illness: Charles Wright is an 84 year old white male who was recently been dealing with an inflammatory or malignant mass of the left renal pelvis causing hematuria.  He was taken a couple of weeks ago for biopsy and left ureteral stent placement.  We are planning left nephrectomy.  In the interim he developed gross hematuria and clots again this evening and came to the emergency room. He said he had been splitting wood. He could not void.  An 35 French catheter was placed by nurse and initially drained but stopped draining and irrigating.  Nurses then tried 3 more catheters of straight and coud type scope could not seem to get 1 into the bladder and urology was consulted.  Complicating the issue he has coronary artery disease and takes Eliquis.  Charles Wright also has a history of prostate cancer and radiation.  He has had multiple issues with the left collecting system including the left distal stricture and then inflammation at the left UPJ last year requiring a stent and again a stent this year with a mass in the left kidney collecting system.  Past Medical History:  Diagnosis Date  . Acute gout of left ankle    06-14-2015  . Bladder cancer (HCC)    BCG's tx's  . BPH (benign prostatic hypertrophy)   . CAD (coronary artery disease) CARDIOLOGIST-  DR TILLEY   a. NSTEMI 12/2014 -  99% dLAD-2 (not amenable to PCI), 50% dLAD-1, 60% mLAD. Medical therapy was recommended.  . Cough    HARSH NONPRODUCTIVE COUGH 1 AND 1/2 WEEKS  . GERD (gastroesophageal reflux disease)    takes  OTC periodically  . Gilbert's syndrome   . H/O cardiac catheterization    a. Note: Difficult radial access in 12/2014 - recommend femoral approach if cath needed in the future.  Marland Kitchen History of kidney stones   . History of non-ST elevation myocardial infarction (NSTEMI)    12-12-2014   CARDIAC CATH W/ NO INTERVENTION X 2FEW DAYS APART  . History of spinal fracture 2002    LUMBAR  . Hyperlipidemia   . Hypertension   . OSA on CPAP    SET ON 7 USES SOME NIGHTS  . Paroxysmal A-fib [facility])    cardiologist-  dr Wynonia Lawman  . Prostate cancer ([place]) 2019   last PSA  2.9  (montiored by urologist  dr Junious Silk) [place] FEB 2019 RAD DONE  . Shingles    2 weeks ago  . Wears glasses    Past Surgical History:  Procedure Laterality Date  . BACK SURGERY  2014   LOWER l 1, 2 4 AND 5  . BILATERAL INGUINAL HERNIA REPAIR    . CARDIAC CATHETERIZATION N/A 12/13/2014   Procedure: Left Heart Cath and Coronary Angiography;  Surgeon: Leonie Man, MD;  Location: [place] [facility];  Service: Cardiovascular;  Laterality: N/A;  culprit lesion diat LAD-2  99%, not approachable via PCTA  due to extremely tortuous up stream LAD/  mLAD 60% and dLAD-1 50%, both are at the extremely tortuous segment and not PCI targets/  otherwise normal coronary arteries  . COLONOSCOPY WITH PROPOFOL N/A 01/16/2019   Procedure: COLONOSCOPY WITH PROPOFOL;  Surgeon: Clarene Essex, MD;  Location: [facility];  Service: Endoscopy;  Laterality: N/A;  . CYSTOSCOPY W/ RETROGRADES Left 08/22/2012   Procedure: CYSTOSCOPY WITH RETROGRADE PYELOGRAM;  left kidney washings;  Surgeon: Fredricka Bonine, MD;  [facility];  Service: Urology;  Laterality: Left;  . CYSTOSCOPY W/ RETROGRADES N/A 07/08/2015   Procedure: CYSTOSCOPY WITH RETROGRADE PYELOGRAM;  Surgeon: [name], MD;  Location[facility];  Service: Urology;  Laterality: N/A;  . CYSTOSCOPY W/ URETERAL STENT PLACEMENT Left 03/24/2019   Procedure: CYSTOSCOPY WITH RETROGRADE PYELOGRAM/URETERAL STENT PLACEMENT ureteroscopy biopsy of neoplasm;  Surgeon: [name], MD;  Location: [facility];  Service: Urology;  Laterality: Left;  . CYSTOSCOPY WITH BIOPSY  08/22/2012   Procedure: CYSTOSCOPY WITH BIOPSY;  Surgeon: Fredricka Bonine, MD;  Location: [facility];  Service: Urology;;  . [name] WITH  BIOPSY N/A 11/08/2014   Procedure: CYSTOSCOPY/BIOPSPY BLADDER INSTILLATION OF MARCAINE AND PYRIDIUM;  Surgeon: [name], MD;  Location: [facility];  Service: Urology;  Laterality: N/A;  . CYSTOSCOPY WITH BIOPSY N/A 07/08/2015   Procedure: CYSTOSCOPY WITH BLADDER BIOPSY, FULGURATION, RETROGRADE PYLOGRAM AND RENAL WASHING;  Surgeon: [name], MD;  Location: [facility];  Service: Urology;  Laterality: N/A;  . CYSTOSCOPY WITH RETROGRADE PYELOGRAM, URETEROSCOPY AND STENT PLACEMENT Bilateral 07/30/2014   Procedure: CYSTOSCOPY WITH BLADDER BX FULERGATION AND BILATERAL RETROGRADE PYELOGRAM,;  Surgeon: [name], MD;  Location: [facility];  Service: Urology;  Laterality: Bilateral;  . CYSTOSCOPY WITH STENT PLACEMENT Left 03/24/2018   Procedure: STENT PLACEMENT AND BIOPSY;  Surgeon: [name], MD;  Location: [facility];  Service: Urology;  Laterality: Left;  . CYSTOSCOPY/RETROGRADE/URETEROSCOPY Bilateral 08/17/2013   Procedure: CYSTOSCOPY, BLADDER BIOPSY WITH BILATERAL RETROGRADE PYELOGRAM, LEFT URETEROSCOPY AND DILATION OF STRICTURE ;  Surgeon: [name], MD;  Location: [facility];  Service: Urology;  Laterality: Bilateral;  . CYSTOSCOPY/RETROGRADE/URETEROSCOPY Left 03/24/2018   Procedure: CYSTOSCOPY/RETROGRADE/URETEROSCOPY;  Surgeon: [name], MD;  Location: [facility];  Service: Urology;  Laterality: Left;  . HOT HEMOSTASIS N/A 01/16/2019   Procedure: HOT HEMOSTASIS (ARGON PLASMA COAGULATION/BICAP);  Surgeon: Clarene Essex, MD;  Location: [facility];  Service: Endoscopy;  Laterality: N/A;  . LEFT ATRIAL APPENDAGE OCCLUSION N/A 01/09/2015   Procedure: LEFT ATRIAL APPENDAGE OCCLUSION;  Surgeon: [name], MD;  Location: [place] [facility];  Service: Cardiovascular;  Laterality: N/A;  . LUMBAR LAMINECTOMY/DECOMPRESSION MICRODISCECTOMY Left 12/03/2013   Procedure: Left Lumbar three-four  diskectomy with Lumbar four laminectomy ;  Surgeon: Newman Pies, MD;  Location: [place] [facility];  Service: Neurosurgery;  Laterality: Left;  Left Lumbar three-four diskectomy with Lumbar four laminectomy   . ORIF LEFT ANKLE FX  2005  . POLYPECTOMY  01/16/2019   Procedure: POLYPECTOMY;  Surgeon: Clarene Essex, MD;  Location: [facility];  Service: Endoscopy;;  . PROSTATE BIOPSY N/A 08/22/2012   Procedure: BIOPSY TRANSRECTAL ULTRASONIC PROSTATE (TUBP);  Surgeon: Fredricka Bonine, MD;  Location: [facility];  Service: Urology;  Laterality: N/A;  . TEE WITHOUT CARDIOVERSION N/A 12/31/2014   Procedure: TRANSESOPHAGEAL ECHOCARDIOGRAM (TEE);  Surgeon: Pixie Casino, MD;  Location: [facility];  Service: Cardiovascular;  Laterality: N/A;  ef 55-60%/  mild MR, TR, and PR/  mild LAE and RAE  . TRANSURETHRAL RESECTION OF BLADDER TUMOR  10/22/2011   Procedure: TRANSURETHRAL RESECTION OF BLADDER TUMOR (TURBT);  Surgeon: Fredricka Bonine, MD;  Location: [facility];  Service: Urology;  Laterality: N/A;  TURBT, LEFT URETEROSCOPY, POSSIBLE URETERAL STENT   . URETEROSCOPY  10/22/2011   Procedure: URETEROSCOPY;  Surgeon: Fredricka Bonine, MD;  Location: [facility];  Service: Urology;  Laterality: Left;  . WISDOM TOOTH EXTRACTION  Home Medications:  (Not in a hospital admission)  Allergies:  Allergies  Allergen Reactions  . Bee Venom Anaphylaxis    Actually Hornets and Wasps.   . Losartan Other (See Comments)    Cough   . Oxycodone Other (See Comments)    ALTERED MENTAL STATUS GETS MEAN    Family History  Problem Relation Age of Onset  . Leukemia Mother   . Cancer Mother        leukemia  . Heart attack Father   . Cancer Sister        breast  . Hypertension Neg Hx   . Stroke Neg Hx    Social History:  reports that he has never smoked. He has never used smokeless tobacco. He reports current alcohol use of about 7.0 standard  drinks of alcohol per week. He reports that he does not use drugs.  ROS: A complete review of systems was performed.  All systems are negative except for pertinent findings as noted. Review of Systems  All other systems reviewed and are negative.    Physical Exam:  Vital signs in last 24 hours: Pulse Rate:  [77-92] 87 (02/28 0500) Resp:  [14-24] 14 (02/28 0500) BP: (106-166)/(63-89) 151/84 (02/28 0500) SpO2:  [92 %-100 %] 99 % (02/28 0500) General:  Alert and oriented, No acute distress HEENT: Normocephalic, atraumatic Cardiovascular: Regular rate and rhythm Lungs: Regular rate and effort Abdomen: Soft, nontender, nondistended, no abdominal masses Back: No CVA tenderness Extremities: No edema Neurologic: Grossly intact  Bladder scan revealed 300 cc.  There was blood in the meatus.  Because 70 catheters have been attempted I discussed with the patient the nature risk and benefits of cystoscopy and he elects to proceed.  He was prepped with Betadine and the flexible cystoscope passed per urethra.  I was able to make my way down to the prostatic urethra where it looked like there was trauma and clot but I could not quite make out the exact nature of the anatomy.  I was able to hug the mucosa anteriorly and make my way into the bladder where the mucosa of the bladder was visible and clot in the bladder.  He immediately voided around the scope. With the scope in the bladder a sensor wire was advanced and the scope backed out.  I then passed a 24 [place] Ainsworth catheter over the wire into the bladder.  The catheter was draining well and drained about 500 cc of bloody urine.  I then irrigated clot until the urine was light pink.  I placed it on some mild traction with a Dale strap.  Laboratory Data:  Results for orders placed or performed during the hospital encounter of 04/07/19 (from the past 24 hour(s))  Urinalysis, Routine w reflex microscopic     Status: Abnormal   Collection Time:  04/08/19 12:16 AM  Result Value Ref Range   Color, Urine RED (A) YELLOW   APPearance TURBID (A) CLEAR   Specific Gravity, Urine  1.005 - 1.030    TEST NOT REPORTED DUE TO COLOR INTERFERENCE OF URINE PIGMENT   pH  5.0 - 8.0    TEST NOT REPORTED DUE TO COLOR INTERFERENCE OF URINE PIGMENT   Glucose, UA (A) NEGATIVE mg/dL    TEST NOT REPORTED DUE TO COLOR INTERFERENCE OF URINE PIGMENT   Hgb urine dipstick NEGATIVE NEGATIVE   Bilirubin Urine (A) NEGATIVE    TEST NOT REPORTED DUE TO COLOR INTERFERENCE OF URINE PIGMENT   Ketones, ur (A)  NEGATIVE mg/dL    TEST NOT REPORTED DUE TO COLOR INTERFERENCE OF URINE PIGMENT   Protein, ur (A) NEGATIVE mg/dL    TEST NOT REPORTED DUE TO COLOR INTERFERENCE OF URINE PIGMENT   Nitrite (A) NEGATIVE    TEST NOT REPORTED DUE TO COLOR INTERFERENCE OF URINE PIGMENT   Leukocytes,Ua (A) NEGATIVE    TEST NOT REPORTED DUE TO COLOR INTERFERENCE OF URINE PIGMENT  Urinalysis, Microscopic (reflex)     Status: Abnormal   Collection Time: 04/08/19 12:16 AM  Result Value Ref Range   RBC / HPF >50 0 - 5 RBC/hpf   WBC, UA 6-10 0 - 5 WBC/hpf   Bacteria, UA RARE (A) NONE SEEN   Squamous Epithelial / LPF NONE SEEN 0 - 5   Urine-Other MICROSCOPIC EXAM PERFORMED ON UNCONCENTRATED URINE    No results found for this or any previous visit (from the past 240 hour(s)). Creatinine: No results for input(s): CREATININE in the last 168 hours.  Impression/Assessment:  Gross hematuria - from a combination of factors including the left collecting system and now prostatic urethral trauma.  Plan:  We will admit for observation and hand irrigation.  If it does not settle down he may need to go back to the operating room for cystoscopy, clot evacuation and fulguration.  He was given a gram of Rocephin.  H&H and BMP were sent.  We will start gentle IV fluids.  [name] 04/08/2019, 5:08 AM

## 2019-04-08 NOTE — Transfer of Care (Signed)
Immediate Anesthesia Transfer of Care Note  Patient: Charles Wright  Procedure(s) Performed: Procedure(s): CYSTOSCOPY cystogram clot evacuation left ureteroscopy clot evacuation left stent exchange liimited transuretheral resectio of prastate and fulguration  bleeders of prostate (Left)  Patient Location: PACU  Anesthesia Type:General  Level of Consciousness:  sedated, patient cooperative and responds to stimulation  Airway & Oxygen Therapy:Patient Spontanous Breathing and Patient connected to face mask oxgen  Post-op Assessment:  Report given to PACU RN and Post -op Vital signs reviewed and stable  Post vital signs:  Reviewed and stable  Last Vitals:  Vitals:   04/08/19 0759 04/08/19 0951  BP:  134/72  Pulse:  79  Resp:    Temp:    SpO2: 99991111     Complications: No apparent anesthesia complications

## 2019-04-08 NOTE — Progress Notes (Signed)
Patient in OR for surgery,

## 2019-04-08 NOTE — Anesthesia Preprocedure Evaluation (Signed)
Anesthesia Evaluation  Patient identified by MRN, date of birth, ID band Patient awake    Reviewed: Allergy & Precautions, NPO status , Patient's Chart, lab work & pertinent test results  History of Anesthesia Complications Negative for: history of anesthetic complications  Airway Mallampati: I  TM Distance: >3 FB Neck ROM: Full    Dental  (+) Teeth Intact   Pulmonary sleep apnea and Continuous Positive Airway Pressure Ventilation ,    Pulmonary exam normal        Cardiovascular hypertension, + CAD and + Past MI (2016)  Normal cardiovascular exam+ dysrhythmias Atrial Fibrillation      Neuro/Psych negative neurological ROS  negative psych ROS   GI/Hepatic GERD  ,Gilbert syndrome   Endo/Other  negative endocrine ROS  Renal/GU Renal disease (L ureteral obstruction/possible perforation)  negative genitourinary   Musculoskeletal negative musculoskeletal ROS (+)   Abdominal   Peds  Hematology negative hematology ROS (+)   Anesthesia Other Findings  2018 Echo: EF 55%, mild-mod LVH, mild AI  Reproductive/Obstetrics                             Anesthesia Physical  Anesthesia Plan  ASA: III and emergent  Anesthesia Plan: General   Post-op Pain Management:    Induction: Intravenous  PONV Risk Score and Plan: 3 and Treatment may vary due to age or medical condition, Ondansetron and Dexamethasone  Airway Management Planned: Oral ETT  Additional Equipment: None  Intra-op Plan:   Post-operative Plan: Extubation in OR  Informed Consent: I have reviewed the patients History and Physical, chart, labs and discussed the procedure including the risks, benefits and alternatives for the proposed anesthesia with the patient or authorized representative who has indicated his/her understanding and acceptance.     Dental advisory given  Plan Discussed with:   Anesthesia Plan Comments:         Anesthesia Quick Evaluation

## 2019-04-08 NOTE — Op Note (Signed)
Preoperative diagnosis: Gross hematuria, left renal bleeding Postoperative diagnosis: Gross hematuria, left renal bleeding, prostatic urethral trauma and bleeding  Procedures:  #1 cystogram  #2 cystoscopy, clot evacuation #3 left ureteroscopy with clot evacuation #4 Limited TURP and fulguration of prostatic urethra  #5 left ureteral stent exchange  Surgeon: Junious Silk  Anesthesia: General  Indication for procedure: [name] is an 84 year old male is had issues with his left kidney with bleeding over the past few months.  It is difficult to ascertain if this is a benign or malignant process but we are in the process of scheduling him for left nephrectomy with Dr. Tresa Moore and getting clearance from Dr. Debara Pickett.  Patient has been taking Eliquis and chopped wood yesterday.  He developed gross hematuria last night and clots and did not feel like he could void.  He came to the emergency and 4 catheters were attempted none of which were successful.  His bleeding became heavy per meatus and urology was consulted.  I scoped in a catheter last night, a two-way catheter, and was having difficulty irrigating it today.  He continued to have some heavy bleeding and was brought for the above procedures.  Findings: On exam under anesthesia the penis was circumcised without mass or lesion.  Meatus appeared normal.  Glans appeared normal.  Cystogram-the bladder was filled with 180 mL of contrast and noted to have a smooth normal contour of the bladder without extravasation.  On cystoscopy the urethra was unremarkable, and the prostatic urethra there was tearing of the mucosa left posterior with clot.  Once this clot was removed there was a growing hematoma which initially was left in place.  Later in the case there was breakthrough bleeding through this hematoma and it was cleared off the surface of the prostate where heavy bleeding was noted from a prostatic sinus in the area of trauma.  It look like the tip of the  Foley catheters may have rammed posteriorly into the back of the prostate and caused some trauma and then the balloon was inflated in the prostate as the verumontanum looked compressed.  Or possibly the tip was in the bladder and the balloon was blown up in the prostatic urethra which split the back of the mucosa.  It took extensive fulguration to get this area to stop bleeding.  I did do some limited TURP from the bladder neck down to better expose the sinus for fulguration.  As far as the bladder it filled and emptied normally once he was asleep.  Bladder had a good capacity and was visually inspected and also noted to be normal without any sign of mucosal lesion or perforation.  On left ureteroscopy again the left ureter is normal but there is clot and fibrin up in the kidney with some bleeding making a clear visual inspection of the collecting system impossible for diagnosis.  I think we are going to have to hold his Eliquis until we can get him to nephrectomy and cleared from cardiology.  At the end of the case no significant active bleeding.  CBI was run as a precaution in light pink.  Description of procedure: After consent was obtained patient brought to the operating room.  After adequate anesthesia he was placed in lithotomy position and prepped and draped in the usual sterile fashion.  A timeout was performed to confirm the patient and procedure.  The Foley catheter had been prepped into the field and contrast was injected through it to ensure no perforation.  I  then passed 2 wires through the Foley catheter and coiled those in the bladder and remove the Foley.  Cystoscope was then passed per urethra and it was evident there was clot in the prostatic urethra and some clot in the bladder.  The clot was evacuated from the bladder and the prostatic urethra and it became evident there was a split in the mucosa posterior left prostate and a growing hematoma.  The hematoma seemed to stabilize and it was  left in place.  I then turned my attention to the bladder again inspected noted to be normal.  Because there was some hydronephrosis with the stent in place I plan to change it and flush out the left collecting system given we were not going to be doing any bladder repair or nephrectomy.  The left ureteral stent was grasped and removed through the urethral meatus and a sensor wire was advanced and coiled in the collecting system.  Cystoscope was passed again and a Glidewire was advanced up and coiled in the collecting system.  A ureteral access sheath was passed without difficulty over the Glidewire up into the left proximal ureter.  The collecting system was washed out with some saline and a 6 [place] open-ended catheter and noted to have some light bleeding.  Ureteroscopy was undertaken and there was a large clot in the left renal pelvis.  The only mucosa I could see was the upper pole infundibulum and upper pole renal papilla similar to the last time.  I tried to get a sample of some fibrin and clot.  A Gemini basket was used to clear some clot from the ureter.  No meaningful sample could be obtained.  With the ureter clear in the collecting system flushed as best as I could the ureteral access sheath was backed out on the ureteroscope and the ureter inspected on the way out noted to be normal without injury.   The cystoscope was passed back per urethra to plan left stent exchange.  There was a lot of clot in the bladder again.  I could not quite tell if it was coming from the prostatic urethra, bladder neck or left ureter.  Therefore I switched out to the continuous flow sheath to get better irrigation and found the clot in the prostatic urethra had grown and was actively bleeding.  Therefore the loop was passed and the clot swept off the prostatic mucosa.  A large sinus was noted left posterior with some fibrinous tissue on it preventing good coagulation.  I took the loop and did a shallow sweep from the  bladder neck down because it was a steep drop to the mid posterior left prostatic urethra to clear off the sinus.  It was quite significant and bleeding and took extensive fulguration to calm it down and gain hemostasis.  Having again hemostasis here again I inspected the bladder and just some mild oozing from the left ureteral orifice.  Switch back to the cystoscope and backloaded the left sensor wire and repassed an 8 x 26 cm Polaris stent.  The coils were placed into the bladder.  This appeared to be draining well without significant bleeding.  Prostate appeared to have adequate hemostasis.  The sensor wire was advanced through the cystoscope and coiled in the bladder and the scope backed out.  I then cut the tip off of a three-way catheter to make it a council tip passed over the wire into the bladder.  The balloon was filled to 15 cc and  seated at the bladder neck and a quick shot of contrast confirmed it to be in good position.  CBI was instituted and light pink.  He was then awakened placed supine and taken to the recovery room in stable condition.  Complications: None  Blood loss: 25 mL  Specimens: None  Drains: 6 x 26 cm left ureteral stent, 22 French three-way Foley catheter  Disposition: Patient stable to PACU

## 2019-04-08 NOTE — ED Provider Notes (Addendum)
[facility] DEPT Provider Note: Georgena Spurling, MD, FACEP  CSN: [id number] MRN: [id number] ARRIVAL: 04/07/19 at [place]: [place]  Urinary Retention   HISTORY OF PRESENT ILLNESS  04/08/19 12:29 AM Charles Wright is a 84 y.o. male who has a history of prostate and bladder cancer and is scheduled for nephrectomy.  He has chronic hematuria and had a Foley catheter previously which was removed about 2 to 3 weeks ago.  He has had persistent passage of grossly bloody urine and clots since.  He presents with inability to void his bladder since about 9 PM yesterday evening.  He is having pain in his suprapubic region which he rates as a 10 out of 10, worse with movement or palpation.  He has not had a fever or chills.  He has blood leaking from his urethra.   Past Medical History:  Diagnosis Date  . Acute gout of left ankle    06-14-2015  . Bladder cancer (HCC)    BCG's tx's  . BPH (benign prostatic hypertrophy)   . CAD (coronary artery disease) CARDIOLOGIST-  DR TILLEY   a. NSTEMI 12/2014 -  99% dLAD-2 (not amenable to PCI), 50% dLAD-1, 60% mLAD. Medical therapy was recommended.  . Cough    HARSH NONPRODUCTIVE COUGH 1 AND 1/2 WEEKS  . GERD (gastroesophageal reflux disease)    takes  OTC periodically  . Gilbert's syndrome   . H/O cardiac catheterization    a. Note: Difficult radial access in 12/2014 - recommend femoral approach if cath needed in the future.  Marland Kitchen History of kidney stones   . History of non-ST elevation myocardial infarction (NSTEMI)    12-12-2014   CARDIAC CATH W/ NO INTERVENTION X 2FEW DAYS APART  . History of spinal fracture 2002   LUMBAR  . Hyperlipidemia   . Hypertension   . OSA on CPAP    SET ON 7 USES SOME NIGHTS  . Paroxysmal A-fib [facility])    cardiologist-  dr Wynonia Lawman  . Prostate cancer ([place]) 2019   last PSA  2.9  (montiored by urologist  dr Junious Silk) [place] FEB 2019 RAD DONE  . Shingles    2 weeks ago  . Wears glasses     Past  Surgical History:  Procedure Laterality Date  . BACK SURGERY  2014   LOWER l 1, 2 4 AND 5  . BILATERAL INGUINAL HERNIA REPAIR    . CARDIAC CATHETERIZATION N/A 12/13/2014   Procedure: Left Heart Cath and Coronary Angiography;  Surgeon: Leonie Man, MD;  Location: [place] CV LAB;  Service: Cardiovascular;  Laterality: N/A;  culprit lesion diat LAD-2  99%, not approachable via PCTA  due to extremely tortuous up stream LAD/  mLAD 60% and dLAD-1 50%, both are at the extremely tortuous segment and not PCI targets/  otherwise normal coronary arteries  . COLONOSCOPY WITH PROPOFOL N/A 01/16/2019   Procedure: COLONOSCOPY WITH PROPOFOL;  Surgeon: Clarene Essex, MD;  Location: [facility];  Service: Endoscopy;  Laterality: N/A;  . CYSTOSCOPY W/ RETROGRADES Left 08/22/2012   Procedure: CYSTOSCOPY WITH RETROGRADE PYELOGRAM;  left kidney washings;  Surgeon: Fredricka Bonine, MD;  Location[facility];  Service: Urology;  Laterality: Left;  . CYSTOSCOPY W/ RETROGRADES N/A 07/08/2015   Procedure: CYSTOSCOPY WITH RETROGRADE PYELOGRAM;  Surgeon: [name], MD;  Location[facility];  Service: Urology;  Laterality: N/A;  . CYSTOSCOPY W/ URETERAL STENT PLACEMENT Left 03/24/2019   Procedure:  CYSTOSCOPY WITH RETROGRADE PYELOGRAM/URETERAL STENT PLACEMENT ureteroscopy biopsy of neoplasm;  Surgeon: [name], MD;  Location: [facility];  Service: Urology;  Laterality: Left;  . CYSTOSCOPY WITH BIOPSY  08/22/2012   Procedure: CYSTOSCOPY WITH BIOPSY;  Surgeon: Fredricka Bonine, MD;  Location: [facility];  Service: Urology;;  . [name] WITH BIOPSY N/A 11/08/2014   Procedure: CYSTOSCOPY/BIOPSPY BLADDER INSTILLATION OF MARCAINE AND PYRIDIUM;  Surgeon: [name], MD;  Location: [facility];  Service: Urology;  Laterality: N/A;  . CYSTOSCOPY WITH BIOPSY N/A 07/08/2015   Procedure: CYSTOSCOPY WITH BLADDER BIOPSY, FULGURATION, RETROGRADE  PYLOGRAM AND RENAL WASHING;  Surgeon: [name], MD;  Location: [facility];  Service: Urology;  Laterality: N/A;  . CYSTOSCOPY WITH RETROGRADE PYELOGRAM, URETEROSCOPY AND STENT PLACEMENT Bilateral 07/30/2014   Procedure: CYSTOSCOPY WITH BLADDER BX FULERGATION AND BILATERAL RETROGRADE PYELOGRAM,;  Surgeon: [name], MD;  Location: [facility];  Service: Urology;  Laterality: Bilateral;  . CYSTOSCOPY WITH STENT PLACEMENT Left 03/24/2018   Procedure: STENT PLACEMENT AND BIOPSY;  Surgeon: [name], MD;  Location: [facility];  Service: Urology;  Laterality: Left;  . CYSTOSCOPY/RETROGRADE/URETEROSCOPY Bilateral 08/17/2013   Procedure: CYSTOSCOPY, BLADDER BIOPSY WITH BILATERAL RETROGRADE PYELOGRAM, LEFT URETEROSCOPY AND DILATION OF STRICTURE ;  Surgeon: [name], MD;  Location: [facility];  Service: Urology;  Laterality: Bilateral;  . CYSTOSCOPY/RETROGRADE/URETEROSCOPY Left 03/24/2018   Procedure: CYSTOSCOPY/RETROGRADE/URETEROSCOPY;  Surgeon: [name], MD;  Location: [facility];  Service: Urology;  Laterality: Left;  . HOT HEMOSTASIS N/A 01/16/2019   Procedure: HOT HEMOSTASIS (ARGON PLASMA COAGULATION/BICAP);  Surgeon: Clarene Essex, MD;  Location: [facility];  Service: Endoscopy;  Laterality: N/A;  . LEFT ATRIAL APPENDAGE OCCLUSION N/A 01/09/2015   Procedure: LEFT ATRIAL APPENDAGE OCCLUSION;  Surgeon: [name], MD;  Location: [place] CV [facility];  Service: Cardiovascular;  Laterality: N/A;  . LUMBAR LAMINECTOMY/DECOMPRESSION MICRODISCECTOMY Left 12/03/2013   Procedure: Left Lumbar three-four diskectomy with Lumbar four laminectomy ;  Surgeon: Newman Pies, MD;  Location: [place] [facility];  Service: Neurosurgery;  Laterality: Left;  Left Lumbar three-four diskectomy with Lumbar four laminectomy   . ORIF LEFT ANKLE FX  2005  . POLYPECTOMY  01/16/2019   Procedure: POLYPECTOMY;  Surgeon: Clarene Essex, MD;  Location:  [facility];  Service: Endoscopy;;  . PROSTATE BIOPSY N/A 08/22/2012   Procedure: BIOPSY TRANSRECTAL ULTRASONIC PROSTATE (TUBP);  Surgeon: Fredricka Bonine, MD;  Location: [facility];  Service: Urology;  Laterality: N/A;  . TEE WITHOUT CARDIOVERSION N/A 12/31/2014   Procedure: TRANSESOPHAGEAL ECHOCARDIOGRAM (TEE);  Surgeon: Pixie Casino, MD;  Location: [facility]COPY;  Service: Cardiovascular;  Laterality: N/A;  ef 55-60%/  mild MR, TR, and PR/  mild LAE and RAE  . TRANSURETHRAL RESECTION OF BLADDER TUMOR  10/22/2011   Procedure: TRANSURETHRAL RESECTION OF BLADDER TUMOR (TURBT);  Surgeon: Fredricka Bonine, MD;  Location: [facility];  Service: Urology;  Laterality: N/A;  TURBT, LEFT URETEROSCOPY, POSSIBLE URETERAL STENT   . URETEROSCOPY  10/22/2011   Procedure: URETEROSCOPY;  Surgeon: Fredricka Bonine, MD;  Location: [facility];  Service: Urology;  Laterality: Left;  . WISDOM TOOTH EXTRACTION      Family History  Problem Relation Age of Onset  . Leukemia Mother   . Cancer Mother        leukemia  . Heart attack Father   . Cancer Sister        breast  . Hypertension Neg  Hx   . Stroke Neg Hx     Social History   Tobacco Use  . Smoking status: Never Smoker  . Smokeless tobacco: Never Used  Substance Use Topics  . Alcohol use: Yes    Alcohol/week: 7.0 standard drinks    Types: 7 Glasses of wine per week    Comment: red wine glass daily  . Drug use: No    Prior to Admission medications   Medication Sig Start Date End Date Taking? Authorizing Provider  allopurinol (ZYLOPRIM) 300 MG tablet Take 300 mg by mouth every morning.    Yes [provider]  apixaban (ELIQUIS) 5 MG TABS tablet Take 1 tablet (5 mg total) by mouth 2 (two) times daily. Hold until catheter removal 03/24/19  Yes Tharon Aquas, MD  aspirin EC 81 MG tablet Take 81 mg by mouth daily.   Yes [provider]  Cholecalciferol (VITAMIN  D-3) 1000 UNITS CAPS Take 1,000 Units by mouth 3 (three) times daily.    Yes [provider]  EPINEPHrine 0.3 mg/0.3 mL IJ SOAJ injection Inject 0.3 mLs (0.3 mg total) into the muscle once. 08/18/14  Yes Piepenbrink, Anderson Malta, PA-C  isosorbide mononitrate (IMDUR) 30 MG 24 hr tablet Take 1 tablet (30 mg total) by mouth daily. Schedule appointment before running out 02/27/18  Yes Park Liter, MD  levothyroxine (SYNTHROID, LEVOTHROID) 50 MCG tablet Take 50 mcg by mouth daily before breakfast.   Yes [provider]  metoprolol (LOPRESSOR) 50 MG tablet Take 50 mg by mouth 2 (two) times daily.   Yes [provider]  nitroGLYCERIN (NITROSTAT) 0.4 MG SL tablet Place 1 tablet (0.4 mg total) under the tongue every 5 (five) minutes x 3 doses as needed for chest pain. 12/14/14  Yes Jacolyn Reedy, MD  pravastatin (PRAVACHOL) 40 MG tablet Take 1 tablet (40 mg total) by mouth daily. 12/14/14  Yes Jacolyn Reedy, MD  Ascorbic Acid (VITAMIN C PO) Take 1 tablet by mouth daily.    [provider]  Glucosamine Sulfate (DONA PO) Take 1 capsule by mouth daily.     [provider]  HYDROcodone-acetaminophen (NORCO/VICODIN) 5-325 MG tablet Take 1 tablet by mouth every 6 (six) hours as needed. 03/18/19   Valarie Merino, MD  Multiple Vitamin (MULTIVITAMIN) tablet Take 1 tablet by mouth daily.     [provider]  Multiple Vitamins-Minerals (ZINC PO) Take 1 tablet by mouth daily.    [provider]  Testosterone 1.62 % GEL Apply 2 Pump topically every other day.     [provider]    Allergies Bee venom, Losartan, and Oxycodone   REVIEW OF SYSTEMS  Negative except as noted here or in the History of Present Illness.   PHYSICAL EXAMINATION  Initial Vital Signs Blood pressure (!) 153/83, pulse 80, resp. rate 17, SpO2 98 %.  Examination General: Well-developed, well-nourished male in no acute distress; appearance consistent with age of  record HENT: normocephalic; atraumatic Eyes: pupils equal, round and reactive to light; extraocular muscles intact Neck: supple Heart: regular rate and rhythm Lungs: clear to auscultation bilaterally Abdomen: soft; distended, tender bladder; bowel sounds present GU: Tanner V male, circumcised; blood leaking at urethral meatus Extremities: No deformity; full range of motion; pulses normal Neurologic: Awake, alert and oriented; motor function intact in all extremities and symmetric; no facial droop Skin: Warm and dry Psychiatric: Grimacing   RESULTS  Summary of this visit's results, reviewed and interpreted by myself:   EKG  Interpretation  Date/Time:    Ventricular Rate:    PR Interval:    QRS Duration:   QT Interval:    QTC Calculation:   R Axis:     Text Interpretation:        Laboratory Studies: Results for orders placed or performed during the hospital encounter of 04/07/19 (from the past 24 hour(s))  Urinalysis, Routine w reflex microscopic     Status: Abnormal   Collection Time: 04/08/19 12:16 AM  Result Value Ref Range   Color, Urine RED (A) YELLOW   APPearance TURBID (A) CLEAR   Specific Gravity, Urine  1.005 - 1.030    TEST NOT REPORTED DUE TO COLOR INTERFERENCE OF URINE PIGMENT   pH  5.0 - 8.0    TEST NOT REPORTED DUE TO COLOR INTERFERENCE OF URINE PIGMENT   Glucose, UA (A) NEGATIVE mg/dL    TEST NOT REPORTED DUE TO COLOR INTERFERENCE OF URINE PIGMENT   Hgb urine dipstick NEGATIVE NEGATIVE   Bilirubin Urine (A) NEGATIVE    TEST NOT REPORTED DUE TO COLOR INTERFERENCE OF URINE PIGMENT   Ketones, ur (A) NEGATIVE mg/dL    TEST NOT REPORTED DUE TO COLOR INTERFERENCE OF URINE PIGMENT   Protein, ur (A) NEGATIVE mg/dL    TEST NOT REPORTED DUE TO COLOR INTERFERENCE OF URINE PIGMENT   Nitrite (A) NEGATIVE    TEST NOT REPORTED DUE TO COLOR INTERFERENCE OF URINE PIGMENT   Leukocytes,Ua (A) NEGATIVE    TEST NOT REPORTED DUE TO COLOR INTERFERENCE OF URINE PIGMENT    Urinalysis, Microscopic (reflex)     Status: Abnormal   Collection Time: 04/08/19 12:16 AM  Result Value Ref Range   RBC / HPF >50 0 - 5 RBC/hpf   WBC, UA 6-10 0 - 5 WBC/hpf   Bacteria, UA RARE (A) NONE SEEN   Squamous Epithelial / LPF NONE SEEN 0 - 5   Urine-Other MICROSCOPIC EXAM PERFORMED ON UNCONCENTRATED URINE    Imaging Studies: No results found.  ED COURSE and MDM  Nursing notes, initial and subsequent vitals signs, including pulse oximetry, reviewed and interpreted by myself.  Vitals:   04/08/19 0100 04/08/19 0102 04/08/19 0104 04/08/19 0246  BP: 106/63   (!) 152/80  Pulse: 77   92  Resp: 18   (!) 24  SpO2: 92% 94% 98% 100%   Medications  lidocaine (XYLOCAINE) 2 % jelly (  Given 04/08/19 0248)  fentaNYL (SUBLIMAZE) injection 100 mcg (100 mcg Intravenous Given 04/08/19 0301)    12:32 AM Foley catheter placed by nursing staff with return of grossly bloody urine.  Bladder irrigated to remove clots.  1:16 AM Catheter continues to drain bloody urine without difficulty.  Urinalysis not consistent with urinary tract infection.  3:42 AM On attempting to discharge the patient he complained of reocclusion.  Nursing staff was unable to irrigate the catheter and removed it.  They were subsequently unable to place another Foley or coud in its place.  Dr. Junious Silk, the patient's urologist, was consulted and will see the patient in the ED.  PROCEDURES  Procedures   ED DIAGNOSES     ICD-10-CM   1. Acute urinary retention  R33.8   2. Gross hematuria  R31.0        Chandelle Harkey, Jenny Reichmann, MD 04/08/19 0117    Shanon Rosser, MD 04/08/19 [phone]

## 2019-04-08 NOTE — Anesthesia Procedure Notes (Deleted)
Spinal

## 2019-04-08 NOTE — ED Notes (Signed)
Patient was verbalized discharge instructions. Pt had no further questions at this time. NAD. 

## 2019-04-08 NOTE — Discharge Instructions (Signed)
Indwelling Urinary Catheter Care, Adult An indwelling urinary catheter is a thin tube that is put into your bladder. The tube helps to drain pee (urine) out of your body. The tube goes in through your urethra. Your urethra is where pee comes out of your body. Your pee will come out through the catheter, then it will go into a bag (drainage bag). Take good care of your catheter so it will work well. How to wear your catheter and bag Supplies needed  Sticky tape (adhesive tape) or a leg strap.  Alcohol wipe or soap and water (if you use tape).  A clean towel (if you use tape).  Large overnight bag.  Smaller bag (leg bag). Wearing your catheter Attach your catheter to your leg with tape or a leg strap.  Make sure the catheter is not pulled tight.  If a leg strap gets wet, take it off and put on a dry strap.  If you use tape to hold the bag on your leg: 1. Use an alcohol wipe or soap and water to wash your skin where the tape made it sticky before. 2. Use a clean towel to pat-dry that skin. 3. Use new tape to make the bag stay on your leg. Wearing your bags You should have been given a large overnight bag.  You may wear the overnight bag in the day or night.  Always have the overnight bag lower than your bladder.  Do not let the bag touch the floor.  Before you go to sleep, put a clean plastic bag in a wastebasket. Then hang the overnight bag inside the wastebasket. You should also have a smaller leg bag that fits under your clothes.  Always wear the leg bag below your knee.  Do not wear your leg bag at night. How to care for your skin and catheter Supplies needed  A clean washcloth.  Water and mild soap.  A clean towel. Caring for your skin and catheter      Clean the skin around your catheter every day: 1. Wash your hands with soap and water. 2. Wet a clean washcloth in warm water and mild soap. 3. Clean the skin around your urethra.  If you are  male:  Gently spread the folds of skin around your vagina (labia).  With the washcloth in your other hand, wipe the inner side of your labia on each side. Wipe from front to back.  If you are male:  Pull back any skin that covers the end of your penis (foreskin).  With the washcloth in your other hand, wipe your penis in small circles. Start wiping at the tip of your penis, then move away from the catheter.  Move the foreskin back in place, if needed. 4. With your free hand, hold the catheter close to where it goes into your body.  Keep holding the catheter during cleaning so it does not get pulled out. 5. With the washcloth in your other hand, clean the catheter.  Only wipe downward on the catheter.  Do not wipe upward toward your body. Doing this may push germs into your urethra and cause infection. 6. Use a clean towel to pat-dry the catheter and the skin around it. Make sure to wipe off all soap. 7. Wash your hands with soap and water.  Shower every day. Do not take baths.  Do not use cream, ointment, or lotion on the area where the catheter goes into your body, unless your doctor tells you   to.  Do not use powders, sprays, or lotions on your genital area.  Check your skin around the catheter every day for signs of infection. Check for: ? Redness, swelling, or pain. ? Fluid or blood. ? Warmth. ? Pus or a bad smell. How to empty the bag Supplies needed  Rubbing alcohol.  Gauze pad or cotton ball.  Tape or a leg strap. Emptying the bag Pour the pee out of your bag when it is ?- full, or at least 2-3 times a day. Do this for your overnight bag and your leg bag. 1. Wash your hands with soap and water. 2. Separate (detach) the bag from your leg. 3. Hold the bag over the toilet or a clean pail. Keep the bag lower than your hips and bladder. This is so the pee (urine) does not go back into the tube. 4. Open the pour spout. It is at the bottom of the bag. 5. Empty the  pee into the toilet or pail. Do not let the pour spout touch any surface. 6. Put rubbing alcohol on a gauze pad or cotton ball. 7. Use the gauze pad or cotton ball to clean the pour spout. 8. Close the pour spout. 9. Attach the bag to your leg with tape or a leg strap. 10. Wash your hands with soap and water. Follow instructions for cleaning the drainage bag:  From the product maker.  As told by your doctor. How to change the bag Supplies needed  Alcohol wipes.  A clean bag.  Tape or a leg strap. Changing the bag Replace your bag when it starts to leak, smell bad, or look dirty. 1. Wash your hands with soap and water. 2. Separate the dirty bag from your leg. 3. Pinch the catheter with your fingers so that pee does not spill out. 4. Separate the catheter tube from the bag tube where these tubes connect (at the connection valve). Do not let the tubes touch any surface. 5. Clean the end of the catheter tube with an alcohol wipe. Use a different alcohol wipe to clean the end of the bag tube. 6. Connect the catheter tube to the tube of the clean bag. 7. Attach the clean bag to your leg with tape or a leg strap. Do not make the bag tight on your leg. 8. Wash your hands with soap and water. General rules   Never pull on your catheter. Never try to take it out. Doing that can hurt you.  Always wash your hands before and after you touch your catheter or bag. Use a mild, fragrance-free soap. If you do not have soap and water, use hand sanitizer.  Always make sure there are no twists or bends (kinks) in the catheter tube.  Always make sure there are no leaks in the catheter or bag.  Drink enough fluid to keep your pee pale yellow.  Do not take baths, swim, or use a hot tub.  If you are male, wipe from front to back after you poop (have a bowel movement). Contact a doctor if:  Your pee is cloudy.  Your pee smells worse than usual.  Your catheter gets clogged.  Your catheter  leaks.  Your bladder feels full. Get help right away if:  You have redness, swelling, or pain where the catheter goes into your body.  You have fluid, blood, pus, or a bad smell coming from the area where the catheter goes into your body.  Your skin feels warm where   the catheter goes into your body.  You have a fever.  You have pain in your: ? Belly (abdomen). ? Legs. ? Lower back. ? Bladder.  You see blood in the catheter.  Your pee is pink or red.  You feel sick to your stomach (nauseous).  You throw up (vomit).  You have chills.  Your pee is not draining into the bag.  Your catheter gets pulled out. Summary  An indwelling urinary catheter is a thin tube that is placed into the bladder to help drain pee (urine) out of the body.  The catheter is placed into the part of the body that drains pee from the bladder (urethra).  Taking good care of your catheter will keep it working properly and help prevent problems.  Always wash your hands before and after touching your catheter or bag.  Never pull on your catheter or try to take it out. This information is not intended to replace advice given to you by your health care provider. Make sure you discuss any questions you have with your health care provider. Document Revised: 05/19/2018 Document Reviewed: 09/10/2016 Elsevier Patient Education  [place].  Ureteral Stent Implantation, Care After This sheet gives you information about how to care for yourself after your procedure. Your health care provider may also give you more specific instructions. If you have problems or questions, contact your health care provider. What can I expect after the procedure? After the procedure, it is common to have:  Nausea.  Mild pain when you urinate. You may feel this pain in your lower back or lower abdomen. The pain should stop within a few minutes after you urinate. This may last for up to 1 week.  A small amount of  blood in your urine for several days. Follow these instructions at home: Medicines  Take over-the-counter and prescription medicines only as told by your health care provider.  If you were prescribed an antibiotic medicine, take it as told by your health care provider. Do not stop taking the antibiotic even if you start to feel better.  Do not drive for 24 hours if you were given a sedative during your procedure.  Ask your health care provider if the medicine prescribed to you requires you to avoid driving or using heavy machinery. Activity  Rest as told by your health care provider.  Avoid sitting for a long time without moving. Get up to take short walks every 1-2 hours. This is important to improve blood flow and breathing. Ask for help if you feel weak or unsteady.  Return to your normal activities as told by your health care provider. Ask your health care provider what activities are safe for you. General instructions   Watch for any blood in your urine. Call your health care provider if the amount of blood in your urine increases.  If you have a catheter: ? Follow instructions from your health care provider about taking care of your catheter and collection bag. ? Do not take baths, swim, or use a hot tub until your health care provider approves. Ask your health care provider if you may take showers. You may only be allowed to take sponge baths.  Drink enough fluid to keep your urine pale yellow.  Do not use any products that contain nicotine or tobacco, such as cigarettes, e-cigarettes, and chewing tobacco. These can delay healing after surgery. If you need help quitting, ask your health care provider.  Keep all follow-up visits as told  by your health care provider. This is important. Contact a health care provider if:  You have pain that gets worse or does not get better with medicine, especially pain when you urinate.  You have difficulty urinating.  You feel nauseous or  you vomit repeatedly during a period of more than 2 days after the procedure. Get help right away if:  Your urine is dark red or has blood clots in it.  You are leaking urine (have incontinence).  The end of the stent comes out of your urethra.  You cannot urinate.  You have sudden, sharp, or severe pain in your abdomen or lower back.  You have a fever.  You have swelling or pain in your legs.  You have difficulty breathing. Summary  After the procedure, it is common to have mild pain when you urinate that goes away within a few minutes after you urinate. This may last for up to 1 week.  Watch for any blood in your urine. Call your health care provider if the amount of blood in your urine increases.  Take over-the-counter and prescription medicines only as told by your health care provider.  Drink enough fluid to keep your urine pale yellow. This information is not intended to replace advice given to you by your health care provider. Make sure you discuss any questions you have with your health care provider. Document Revised: 11/01/2017 Document Reviewed: 11/02/2017 Elsevier Patient Education  2020 [employer].

## 2019-04-08 NOTE — ED Notes (Signed)
Urologist at bedside.

## 2019-04-08 NOTE — ED Notes (Signed)
Charles Wright (Wife) [id number]

## 2019-04-08 NOTE — Anesthesia Postprocedure Evaluation (Signed)
Anesthesia Post Note  Patient: Charles Wright  Procedure(s) Performed: CYSTOSCOPY cystogram clot evacuation left ureteroscopy clot evacuation left stent exchange liimited transuretheral resectio of prastate and fulguration  bleeders of prostate (Left Bladder)     Patient location during evaluation: PACU Anesthesia Type: General Level of consciousness: awake and alert Pain management: pain level controlled Vital Signs Assessment: post-procedure vital signs reviewed and stable Respiratory status: spontaneous breathing, nonlabored ventilation and respiratory function stable Cardiovascular status: blood pressure returned to baseline and stable Postop Assessment: no apparent nausea or vomiting Anesthetic complications: no    Last Vitals:  Vitals:   04/08/19 1630 04/08/19 1651  BP: 119/73 107/67  Pulse: (!) 54 (!) 55  Resp: 12 14  Temp: 36.4 C (!) 36.2 C  SpO2: 100% 99%    Last Pain:  Vitals:   04/08/19 1651  TempSrc: Oral  PainSc:                  Lidia Collum

## 2019-04-09 ENCOUNTER — Other Ambulatory Visit: Payer: Self-pay | Admitting: Urology

## 2019-04-09 DIAGNOSIS — D418 Neoplasm of uncertain behavior of other specified urinary organs: Secondary | ICD-10-CM | POA: Diagnosis not present

## 2019-04-09 DIAGNOSIS — N2889 Other specified disorders of kidney and ureter: Secondary | ICD-10-CM | POA: Diagnosis not present

## 2019-04-09 DIAGNOSIS — R310 Gross hematuria: Secondary | ICD-10-CM | POA: Diagnosis not present

## 2019-04-09 DIAGNOSIS — R338 Other retention of urine: Secondary | ICD-10-CM | POA: Diagnosis not present

## 2019-04-09 DIAGNOSIS — N3289 Other specified disorders of bladder: Secondary | ICD-10-CM | POA: Diagnosis not present

## 2019-04-09 DIAGNOSIS — Z8546 Personal history of malignant neoplasm of prostate: Secondary | ICD-10-CM | POA: Diagnosis not present

## 2019-04-09 LAB — CBC
HCT: 32.5 % — ABNORMAL LOW (ref 39.0–52.0)
Hemoglobin: 10.3 g/dL — ABNORMAL LOW (ref 13.0–17.0)
MCH: 29.9 pg (ref 26.0–34.0)
MCHC: 31.7 g/dL (ref 30.0–36.0)
MCV: 94.5 fL (ref 80.0–100.0)
Platelets: 302 10*3/uL (ref 150–400)
RBC: 3.44 MIL/uL — ABNORMAL LOW (ref 4.22–5.81)
RDW: 14.4 % (ref 11.5–15.5)
WBC: 15.3 10*3/uL — ABNORMAL HIGH (ref 4.0–10.5)
nRBC: 0 % (ref 0.0–0.2)

## 2019-04-09 LAB — BASIC METABOLIC PANEL
Anion gap: 8 (ref 5–15)
BUN: 19 mg/dL (ref 8–23)
CO2: 22 mmol/L (ref 22–32)
Calcium: 8.4 mg/dL — ABNORMAL LOW (ref 8.9–10.3)
Chloride: 108 mmol/L (ref 98–111)
Creatinine, Ser: 1.33 mg/dL — ABNORMAL HIGH (ref 0.61–1.24)
GFR calc Af Amer: 57 mL/min — ABNORMAL LOW (ref >60)
GFR calc non Af Amer: 49 mL/min — ABNORMAL LOW (ref >60)
Glucose, Bld: 136 mg/dL — ABNORMAL HIGH (ref 70–99)
Potassium: 4.7 mmol/L (ref 3.5–5.1)
Sodium: 138 mmol/L (ref 135–145)

## 2019-04-09 MED ORDER — CEPHALEXIN 500 MG PO CAPS: 500.0000 mg | ORAL_CAPSULE | Freq: Every day | ORAL | 0 refills | Status: AC

## 2019-04-09 MED ORDER — DOCUSATE SODIUM 100 MG PO CAPS
100.0000 mg | ORAL_CAPSULE | Freq: Two times a day (BID) | ORAL | Status: DC
  Administered 2019-04-09: 100 mg via ORAL

## 2019-04-09 MED ORDER — DOCUSATE SODIUM 100 MG PO CAPS: 100.0000 mg | ORAL_CAPSULE | Freq: Two times a day (BID) | ORAL | 0 refills | Status: DC

## 2019-04-09 NOTE — Discharge Summary (Signed)
Physician Discharge Summary  Patient ID: Charles Wright MRN: [id number] DOB/AGE: 04-21-1935 84 y.o.  Admit date: 04/07/2019 Discharge date: 04/09/2019  Admission Diagnoses:  Discharge Diagnoses:  Active Problems:   Gross hematuria   Discharged Condition: good  Hospital Course: Charles Wright was admitted for observation after he required cystoscopy and foley catheter placement in the ED. He presented to ED with gross hematuria (from the left kidney again) and four catheters were tried for clot retention. I scoped one in and he ultimately required cystoscopy in the OR with fulguration of a prostate sinus that was bleeding pretty profusely from catheter trauma. Cystogram was negative for perforation. I also went ahead and changed the left stent and did left ureteroscopy, but too much clot/blood to get any reasonable visual of the left collecting system. Hgb was 10.2 in the OR and 10.3 this morning. He was on CBI overnight but off today and urine clear to rose' per pt and draining well. Dr. Tresa Wright went by and consented for left nephrectomy planned for Friday or in four days. Pt ambulating and tolerating regular diet and would like to go home.   Consults: None  Significant Diagnostic Studies: none  Treatments: surgery: cystoscopy, cystogram, fulguration of bleeder in prostatic urethra from foley trauma, left ureteroscopy, left stent exchange  Discharge Exam: Blood pressure (!) 104/59, pulse 70, temperature 97.8 F (36.6 C), temperature source Oral, resp. rate 18, height 5\' 7"  (1.702 m), weight 98.1 kg, SpO2 97 %. Pt well appearing  Watching TV CV - RRR Resp - reg effort and depth Abd - soft, NT GU - foley in place - urine light pink with CBI off (bag had run out) Ext - scds in place   Disposition:  There are no questions and answers to display.         Allergies as of 04/09/2019      Reactions   Bee Venom Anaphylaxis   Actually Hornets and Wasps.    Losartan Other (See Comments)   Cough   Oxycodone Other (See Comments)   ALTERED MENTAL STATUS GETS MEAN      Medication List    STOP taking these medications   apixaban 5 MG Tabs tablet Commonly known as: Eliquis     TAKE these medications   allopurinol 300 MG tablet Commonly known as: ZYLOPRIM Take 300 mg by mouth every morning.   aspirin EC 81 MG tablet Take 81 mg by mouth daily.   cephALEXin 500 MG capsule Commonly known as: KEFLEX Take 1 capsule (500 mg total) by mouth at bedtime for 10 days.   docusate sodium 100 MG capsule Commonly known as: COLACE Take 1 capsule (100 mg total) by mouth 2 (two) times daily.   DONA PO Take 1 capsule by mouth daily.   EPINEPHrine 0.3 mg/0.3 mL Soaj injection Commonly known as: EPI-PEN Inject 0.3 mLs (0.3 mg total) into the muscle once. What changed:   when to take this  reasons to take this   HYDROcodone-acetaminophen 5-325 MG tablet Commonly known as: NORCO/VICODIN Take 1 tablet by mouth every 6 (six) hours as needed. What changed: reasons to take this   isosorbide mononitrate 30 MG 24 hr tablet Commonly known as: IMDUR Take 1 tablet (30 mg total) by mouth daily. Schedule appointment before running out   levothyroxine 50 MCG tablet Commonly known as: SYNTHROID Take 50 mcg by mouth daily before breakfast.   metoprolol tartrate 50 MG tablet Commonly known as: LOPRESSOR Take 50 mg by mouth 2 (two)  times daily.   multivitamin tablet Take 1 tablet by mouth daily.   nitroGLYCERIN 0.4 MG SL tablet Commonly known as: NITROSTAT Place 1 tablet (0.4 mg total) under the tongue every 5 (five) minutes x 3 doses as needed for chest pain.   pravastatin 40 MG tablet Commonly known as: PRAVACHOL Take 1 tablet (40 mg total) by mouth daily.   Testosterone 1.62 % Gel Apply 2 Pump topically every other day.   VITAMIN C PO Take 1 tablet by mouth daily.   Vitamin D-3 25 MCG (1000 UT) Caps Take 1,000 Units by mouth 3 (three) times daily.   ZINC PO Take 1  tablet by mouth daily.      Follow-up Information    Charles Frock, MD Follow up.   Specialty: Urology Why: Dr. Zettie Wright surgery scheduler will call regarding details of surgery Friday.  Contact information: [address] [place] [address] [phone]           Signed: [name] 04/09/2019, 5:13 PM

## 2019-04-09 NOTE — Progress Notes (Signed)
No change from am assessment. Patient remains stable A&Ox4, ambulatory without assistance. Discharge/Foley education reviewed. Questions, concerns were denied at this time. Supplies foley cathter provided.

## 2019-04-09 NOTE — Progress Notes (Signed)
V.O received from urologist to discontinue continuous bladder irrigation and saline lock intravenous fluids. Will continue to monitor

## 2019-04-10 ENCOUNTER — Other Ambulatory Visit (HOSPITAL_COMMUNITY)
Admission: RE | Admit: 2019-04-10 | Discharge: 2019-04-10 | Disposition: A | Discharge status: Home / Self Care | Payer: PPO | Source: Ambulatory Visit | Attending: Urology | Admitting: Urology

## 2019-04-10 DIAGNOSIS — N2889 Other specified disorders of kidney and ureter: Secondary | ICD-10-CM | POA: Diagnosis present

## 2019-04-10 DIAGNOSIS — D4112 Neoplasm of uncertain behavior of left renal pelvis: Secondary | ICD-10-CM | POA: Diagnosis not present

## 2019-04-10 DIAGNOSIS — Z8249 Family history of ischemic heart disease and other diseases of the circulatory system: Secondary | ICD-10-CM | POA: Diagnosis not present

## 2019-04-10 DIAGNOSIS — E785 Hyperlipidemia, unspecified: Secondary | ICD-10-CM | POA: Diagnosis present

## 2019-04-10 DIAGNOSIS — Z87892 Personal history of anaphylaxis: Secondary | ICD-10-CM | POA: Diagnosis not present

## 2019-04-10 DIAGNOSIS — Z923 Personal history of irradiation: Secondary | ICD-10-CM | POA: Diagnosis not present

## 2019-04-10 DIAGNOSIS — C642 Malignant neoplasm of left kidney, except renal pelvis: Secondary | ICD-10-CM | POA: Diagnosis not present

## 2019-04-10 DIAGNOSIS — Z91030 Bee allergy status: Secondary | ICD-10-CM | POA: Diagnosis not present

## 2019-04-10 DIAGNOSIS — I480 Paroxysmal atrial fibrillation: Secondary | ICD-10-CM | POA: Diagnosis present

## 2019-04-10 DIAGNOSIS — T63451D Toxic effect of venom of hornets, accidental (unintentional), subsequent encounter: Secondary | ICD-10-CM | POA: Diagnosis not present

## 2019-04-10 DIAGNOSIS — Z8546 Personal history of malignant neoplasm of prostate: Secondary | ICD-10-CM | POA: Diagnosis not present

## 2019-04-10 DIAGNOSIS — T63461D Toxic effect of venom of wasps, accidental (unintentional), subsequent encounter: Secondary | ICD-10-CM | POA: Diagnosis not present

## 2019-04-10 DIAGNOSIS — E039 Hypothyroidism, unspecified: Secondary | ICD-10-CM | POA: Diagnosis present

## 2019-04-10 DIAGNOSIS — C652 Malignant neoplasm of left renal pelvis: Secondary | ICD-10-CM | POA: Diagnosis present

## 2019-04-10 DIAGNOSIS — Z888 Allergy status to other drugs, medicaments and biological substances status: Secondary | ICD-10-CM | POA: Diagnosis not present

## 2019-04-10 DIAGNOSIS — C772 Secondary and unspecified malignant neoplasm of intra-abdominal lymph nodes: Secondary | ICD-10-CM | POA: Diagnosis present

## 2019-04-10 DIAGNOSIS — K219 Gastro-esophageal reflux disease without esophagitis: Secondary | ICD-10-CM | POA: Diagnosis present

## 2019-04-10 DIAGNOSIS — I10 Essential (primary) hypertension: Secondary | ICD-10-CM | POA: Diagnosis present

## 2019-04-10 DIAGNOSIS — N400 Enlarged prostate without lower urinary tract symptoms: Secondary | ICD-10-CM | POA: Diagnosis present

## 2019-04-10 DIAGNOSIS — G4733 Obstructive sleep apnea (adult) (pediatric): Secondary | ICD-10-CM | POA: Diagnosis present

## 2019-04-10 DIAGNOSIS — Z87442 Personal history of urinary calculi: Secondary | ICD-10-CM | POA: Diagnosis not present

## 2019-04-10 DIAGNOSIS — Z20822 Contact with and (suspected) exposure to covid-19: Secondary | ICD-10-CM | POA: Diagnosis present

## 2019-04-10 DIAGNOSIS — I2510 Atherosclerotic heart disease of native coronary artery without angina pectoris: Secondary | ICD-10-CM | POA: Diagnosis present

## 2019-04-10 DIAGNOSIS — Z8551 Personal history of malignant neoplasm of bladder: Secondary | ICD-10-CM | POA: Diagnosis not present

## 2019-04-10 DIAGNOSIS — I252 Old myocardial infarction: Secondary | ICD-10-CM | POA: Diagnosis not present

## 2019-04-10 DIAGNOSIS — Z885 Allergy status to narcotic agent status: Secondary | ICD-10-CM | POA: Diagnosis not present

## 2019-04-10 LAB — SARS CORONAVIRUS 2 (TAT 6-24 HRS): SARS Coronavirus 2: NEGATIVE

## 2019-04-12 ENCOUNTER — Other Ambulatory Visit: Payer: Self-pay

## 2019-04-12 ENCOUNTER — Encounter (HOSPITAL_COMMUNITY): Payer: Self-pay

## 2019-04-12 ENCOUNTER — Encounter (HOSPITAL_COMMUNITY)
Admission: RE | Admit: 2019-04-12 | Discharge: 2019-04-12 | Disposition: A | Discharge status: Home / Self Care | Payer: PPO | Source: Ambulatory Visit | Attending: Urology | Admitting: Urology

## 2019-04-12 HISTORY — DX: Acute myocardial infarction, unspecified: I21.9

## 2019-04-12 HISTORY — DX: Hypothyroidism, unspecified: E03.9

## 2019-04-12 LAB — CBC
HCT: 29.5 % — ABNORMAL LOW (ref 39.0–52.0)
Hemoglobin: 9.5 g/dL — ABNORMAL LOW (ref 13.0–17.0)
MCH: 29.6 pg (ref 26.0–34.0)
MCHC: 32.2 g/dL (ref 30.0–36.0)
MCV: 91.9 fL (ref 80.0–100.0)
Platelets: 378 10*3/uL (ref 150–400)
RBC: 3.21 MIL/uL — ABNORMAL LOW (ref 4.22–5.81)
RDW: 13.6 % (ref 11.5–15.5)
WBC: 10.6 10*3/uL — ABNORMAL HIGH (ref 4.0–10.5)
nRBC: 0 % (ref 0.0–0.2)

## 2019-04-12 LAB — BASIC METABOLIC PANEL
Anion gap: 8 (ref 5–15)
BUN: 24 mg/dL — ABNORMAL HIGH (ref 8–23)
CO2: 23 mmol/L (ref 22–32)
Calcium: 8.4 mg/dL — ABNORMAL LOW (ref 8.9–10.3)
Chloride: 106 mmol/L (ref 98–111)
Creatinine, Ser: 1.35 mg/dL — ABNORMAL HIGH (ref 0.61–1.24)
GFR calc Af Amer: 56 mL/min — ABNORMAL LOW (ref >60)
GFR calc non Af Amer: 49 mL/min — ABNORMAL LOW (ref >60)
Glucose, Bld: 105 mg/dL — ABNORMAL HIGH (ref 70–99)
Potassium: 4.2 mmol/L (ref 3.5–5.1)
Sodium: 137 mmol/L (ref 135–145)

## 2019-04-12 NOTE — Progress Notes (Signed)
PCP - Dr. Maury Dus Cardiologist - Dr. Lyman Bishop  Clearance from Coletta Memos, NP from 03/20/2019 on chart and in epic  Chest x-ray - n/a EKG - 10/10/2018 epic Stress Test -  ECHO - 12/31/2014-TEE with cardioversion  epic; 05/28/2016- ECHO transthoracic  epic Cardiac Cath - 12/13/2014  epic  Sleep Study - 4 years ago Dr. Maxwell Caul CPAP - was using CPAP occasionally but had skin cancer removal from face and not using it at present  Fasting Blood Sugar - n/a Checks Blood Sugar ___0__ times a day  Blood Thinner Instructions:n/a Aspirin Instructions:Aspirin EC 81 mg prescribed by Dr. Debara Pickett. Per patient has not had an Aspirin since he came to the hospital on 04/07/2019. Last Dose:04/07/2019   Anesthesia review:  Chart to be reviewed by Audria Nine.  Patient has a history of prostate cancer/bladder cancer, CAD, A-Fib., Sleep apnea (was using CPAP occasionally until he had skin cancer removed from face months ago and is not using CPAP), MI (NSTEMI), and kidney stones.  Patient denies shortness of breath, fever, cough and chest pain at PAT appointment   Patient verbalized understanding of instructions that were given to them at the PAT appointment. Patient was also instructed that they will need to review over the PAT instructions again at home before surgery.

## 2019-04-12 NOTE — Patient Instructions (Addendum)
DUE TO COVID-19 ONLY ONE VISITOR IS ALLOWED TO COME WITH YOU AND STAY IN THE WAITING ROOM ONLY DURING PRE OP AND PROCEDURE DAY OF SURGERY. THE 1 VISITOR MAY VISIT WITH YOU AFTER SURGERY IN YOUR PRIVATE ROOM DURING VISITING HOURS ONLY!   ONCE YOUR COVID TEST IS COMPLETED, PLEASE BEGIN THE QUARANTINE INSTRUCTIONS AS OUTLINED IN YOUR HANDOUT.                Charles Wright     Your procedure is scheduled on: Friday 04/13/2019   Report to [facility] Main  Entrance    Report to admitting at  1245 PM     Call this number if you have problems the morning of surgery [phone]               Follow bowel prep instructions from Dr. Tresa Moore the day before surgery on Thursday 04/12/2019 and follow a clear liquid diet all day!               Please drink one 8 ounce bottle of Magnesuim citrate by noon the day before surgery on Thursday 04/12/2019!    CLEAR LIQUID DIET   Foods Allowed                                                                     Foods Excluded  Coffee and tea, regular and decaf                             liquids that you cannot  Plain Jell-O any favor except red or purple                                           see through such as: Fruit ices (not with fruit pulp)                                     milk, soups, orange juice  Iced Popsicles                                    All solid food Carbonated beverages, regular and diet                                    Cranberry, grape and apple juices Sports drinks like Gatorade Lightly seasoned clear broth or consume(fat free) Sugar, honey syrup  Sample Menu Breakfast                                Lunch                                     Supper Cranberry juice  Beef broth                            Chicken broth Jell-O                                     Grape juice                           Apple juice Coffee or tea                        Jell-O                                       Popsicle                                                Coffee or tea                        Coffee or tea  _____________________________________________________________________     Remember: Do not eat food  :After Midnight.  May have clear liquids from midnight up until 0845 am and then nothing until after surgery!    BRUSH YOUR TEETH MORNING OF SURGERY AND RINSE YOUR MOUTH OUT, NO CHEWING GUM CANDY OR MINTS.     Take these medicines the morning of surgery with A SIP OF WATER: Isosorbide mononitrate (Imdur), Pravastatin (Pravachol), Metoprolol (Lopressor), Allopurinol (Zyloprim), Levothyroxine (Synthroid)                                 You may not have any metal on your body including hair pins and              piercings  Do not wear jewelry, make-up, lotions, powders or perfumes, deodorant                          Men may shave face and neck.   Do not bring valuables to the hospital. [place].  Contacts, dentures or bridgework may not be worn into surgery.  Leave suitcase in the car. After surgery it may be brought to your room.                  Please read over the following fact sheets you were given: _____________________________________________________________________             [facility] - Preparing for Surgery Before surgery, you can play an important role.  Because skin is not sterile, your skin needs to be as free of germs as possible.  You can reduce the number of germs on your skin by washing with CHG (chlorahexidine gluconate) soap before surgery.  CHG is an antiseptic cleaner which kills germs and bonds with the skin to continue killing germs even after washing. Please DO NOT use if you have an allergy to CHG or antibacterial  soaps.  If your skin becomes reddened/irritated stop using the CHG and inform your nurse when you arrive at Short Stay. Do not shave (including legs and underarms) for at least 48 hours  prior to the first CHG shower.  You may shave your face/neck. Please follow these instructions carefully:  1.  Shower with CHG Soap the night before surgery and the  morning of Surgery.  2.  If you choose to wash your hair, wash your hair first as usual with your  normal  shampoo.  3.  After you shampoo, rinse your hair and body thoroughly to remove the  shampoo.                           4.  Use CHG as you would any other liquid soap.  You can apply chg directly  to the skin and wash                       Gently with a scrungie or clean washcloth.  5.  Apply the CHG Soap to your body ONLY FROM THE NECK DOWN.   Do not use on face/ open                           Wound or open sores. Avoid contact with eyes, ears mouth and genitals (private parts).                       Wash face,  Genitals (private parts) with your normal soap.             6.  Wash thoroughly, paying special attention to the area where your surgery  will be performed.  7.  Thoroughly rinse your body with warm water from the neck down.  8.  DO NOT shower/wash with your normal soap after using and rinsing off  the CHG Soap.                9.  Pat yourself dry with a clean towel.            10.  Wear clean pajamas.            11.  Place clean sheets on your bed the night of your first shower and do not  sleep with pets. Day of Surgery : Do not apply any lotions/deodorants the morning of surgery.  Please wear clean clothes to the hospital/surgery center.  FAILURE TO FOLLOW THESE INSTRUCTIONS MAY RESULT IN THE CANCELLATION OF YOUR SURGERY PATIENT SIGNATURE_________________________________  NURSE SIGNATURE__________________________________  ________________________________________________________________________

## 2019-04-13 ENCOUNTER — Inpatient Hospital Stay (HOSPITAL_COMMUNITY): Payer: PPO | Admitting: Physician Assistant

## 2019-04-13 ENCOUNTER — Inpatient Hospital Stay (HOSPITAL_COMMUNITY)
Admission: RE | Admit: 2019-04-13 | Discharge: 2019-04-16 | DRG: 657 | Disposition: A | Discharge status: Home / Self Care | Payer: PPO | Attending: Urology | Admitting: Urology

## 2019-04-13 ENCOUNTER — Encounter (HOSPITAL_COMMUNITY): Payer: Self-pay | Admitting: Urology

## 2019-04-13 ENCOUNTER — Encounter (HOSPITAL_COMMUNITY)
Admission: RE | Disposition: A | Discharge status: Home / Self Care | Payer: Self-pay | Source: Home / Self Care | Attending: Urology

## 2019-04-13 ENCOUNTER — Inpatient Hospital Stay (HOSPITAL_COMMUNITY): Payer: PPO | Admitting: Certified Registered Nurse Anesthetist

## 2019-04-13 DIAGNOSIS — C652 Malignant neoplasm of left renal pelvis: Secondary | ICD-10-CM | POA: Diagnosis present

## 2019-04-13 DIAGNOSIS — N2889 Other specified disorders of kidney and ureter: Secondary | ICD-10-CM | POA: Diagnosis present

## 2019-04-13 DIAGNOSIS — Z91030 Bee allergy status: Secondary | ICD-10-CM | POA: Diagnosis not present

## 2019-04-13 DIAGNOSIS — I480 Paroxysmal atrial fibrillation: Secondary | ICD-10-CM | POA: Diagnosis present

## 2019-04-13 DIAGNOSIS — Z20822 Contact with and (suspected) exposure to covid-19: Secondary | ICD-10-CM | POA: Diagnosis present

## 2019-04-13 DIAGNOSIS — Z885 Allergy status to narcotic agent status: Secondary | ICD-10-CM

## 2019-04-13 DIAGNOSIS — I2510 Atherosclerotic heart disease of native coronary artery without angina pectoris: Secondary | ICD-10-CM | POA: Diagnosis present

## 2019-04-13 DIAGNOSIS — Z923 Personal history of irradiation: Secondary | ICD-10-CM | POA: Diagnosis not present

## 2019-04-13 DIAGNOSIS — Z87442 Personal history of urinary calculi: Secondary | ICD-10-CM | POA: Diagnosis not present

## 2019-04-13 DIAGNOSIS — G4733 Obstructive sleep apnea (adult) (pediatric): Secondary | ICD-10-CM | POA: Diagnosis present

## 2019-04-13 DIAGNOSIS — C772 Secondary and unspecified malignant neoplasm of intra-abdominal lymph nodes: Secondary | ICD-10-CM | POA: Diagnosis present

## 2019-04-13 DIAGNOSIS — K219 Gastro-esophageal reflux disease without esophagitis: Secondary | ICD-10-CM | POA: Diagnosis present

## 2019-04-13 DIAGNOSIS — E785 Hyperlipidemia, unspecified: Secondary | ICD-10-CM | POA: Diagnosis present

## 2019-04-13 DIAGNOSIS — Z8249 Family history of ischemic heart disease and other diseases of the circulatory system: Secondary | ICD-10-CM | POA: Diagnosis not present

## 2019-04-13 DIAGNOSIS — E039 Hypothyroidism, unspecified: Secondary | ICD-10-CM | POA: Diagnosis present

## 2019-04-13 DIAGNOSIS — N400 Enlarged prostate without lower urinary tract symptoms: Secondary | ICD-10-CM | POA: Diagnosis present

## 2019-04-13 DIAGNOSIS — Z8551 Personal history of malignant neoplasm of bladder: Secondary | ICD-10-CM

## 2019-04-13 DIAGNOSIS — I10 Essential (primary) hypertension: Secondary | ICD-10-CM | POA: Diagnosis present

## 2019-04-13 DIAGNOSIS — I252 Old myocardial infarction: Secondary | ICD-10-CM | POA: Diagnosis not present

## 2019-04-13 DIAGNOSIS — Z87892 Personal history of anaphylaxis: Secondary | ICD-10-CM

## 2019-04-13 DIAGNOSIS — Z8546 Personal history of malignant neoplasm of prostate: Secondary | ICD-10-CM | POA: Diagnosis not present

## 2019-04-13 DIAGNOSIS — Z888 Allergy status to other drugs, medicaments and biological substances status: Secondary | ICD-10-CM | POA: Diagnosis not present

## 2019-04-13 HISTORY — PX: LYMPH NODE DISSECTION: SHX5087

## 2019-04-13 HISTORY — PX: ROBOT ASSITED LAPAROSCOPIC NEPHROURETERECTOMY: SHX6077

## 2019-04-13 LAB — HEMOGLOBIN AND HEMATOCRIT, BLOOD
HCT: 28.6 % — ABNORMAL LOW (ref 39.0–52.0)
Hemoglobin: 9 g/dL — ABNORMAL LOW (ref 13.0–17.0)

## 2019-04-13 LAB — PREPARE RBC (CROSSMATCH)

## 2019-04-13 SURGERY — NEPHROURETERECTOMY, ROBOT-ASSISTED, LAPAROSCOPIC
Anesthesia: General | Laterality: Left

## 2019-04-13 MED ORDER — PROPOFOL 10 MG/ML IV BOLUS
INTRAVENOUS | Status: DC | PRN
  Administered 2019-04-13: 110 mg via INTRAVENOUS

## 2019-04-13 MED ORDER — CEFAZOLIN SODIUM-DEXTROSE 2-4 GM/100ML-% IV SOLN
2.0000 g | INTRAVENOUS | Status: AC
  Administered 2019-04-13: 2 g via INTRAVENOUS
  Filled 2019-04-13: qty 100

## 2019-04-13 MED ORDER — DEXAMETHASONE SODIUM PHOSPHATE 10 MG/ML IJ SOLN
INTRAMUSCULAR | Status: DC | PRN
  Administered 2019-04-13: 5 mg via INTRAVENOUS

## 2019-04-13 MED ORDER — DEXAMETHASONE SODIUM PHOSPHATE 10 MG/ML IJ SOLN
INTRAMUSCULAR | Status: AC
  Filled 2019-04-13: qty 1

## 2019-04-13 MED ORDER — PRAVASTATIN SODIUM 40 MG PO TABS
40.0000 mg | ORAL_TABLET | Freq: Every day | ORAL | Status: DC
  Administered 2019-04-14 – 2019-04-16 (×3): 40 mg via ORAL
  Filled 2019-04-13 (×3): qty 1

## 2019-04-13 MED ORDER — LIDOCAINE 20MG/ML (2%) 15 ML SYRINGE OPTIME
INTRAMUSCULAR | Status: DC | PRN
  Administered 2019-04-13: 1 mg/kg/h via INTRAVENOUS

## 2019-04-13 MED ORDER — HYDROMORPHONE HCL 1 MG/ML IJ SOLN
INTRAMUSCULAR | Status: AC
  Filled 2019-04-13: qty 1

## 2019-04-13 MED ORDER — ROCURONIUM BROMIDE 50 MG/5ML IV SOSY
PREFILLED_SYRINGE | INTRAVENOUS | Status: DC | PRN
  Administered 2019-04-13: 70 mg via INTRAVENOUS
  Administered 2019-04-13: 30 mg via INTRAVENOUS

## 2019-04-13 MED ORDER — METOPROLOL TARTRATE 50 MG PO TABS
50.0000 mg | ORAL_TABLET | Freq: Two times a day (BID) | ORAL | Status: DC
  Administered 2019-04-14 – 2019-04-15 (×3): 50 mg via ORAL
  Filled 2019-04-13 (×5): qty 1

## 2019-04-13 MED ORDER — MAGNESIUM CITRATE PO SOLN: 1.0000 | Freq: Once | ORAL | Status: DC

## 2019-04-13 MED ORDER — SODIUM CHLORIDE (PF) 0.9 % IJ SOLN
INTRAMUSCULAR | Status: AC
  Filled 2019-04-13: qty 20

## 2019-04-13 MED ORDER — LACTATED RINGERS IV SOLN: INTRAVENOUS | Status: DC

## 2019-04-13 MED ORDER — EPHEDRINE 5 MG/ML INJ
INTRAVENOUS | Status: AC
  Filled 2019-04-13: qty 10

## 2019-04-13 MED ORDER — ISOSORBIDE MONONITRATE ER 30 MG PO TB24
30.0000 mg | ORAL_TABLET | Freq: Every day | ORAL | Status: DC
  Administered 2019-04-14 – 2019-04-16 (×3): 30 mg via ORAL
  Filled 2019-04-13 (×3): qty 1

## 2019-04-13 MED ORDER — ALLOPURINOL 300 MG PO TABS
300.0000 mg | ORAL_TABLET | Freq: Every morning | ORAL | Status: DC
  Administered 2019-04-14 – 2019-04-16 (×3): 300 mg via ORAL
  Filled 2019-04-13 (×3): qty 1

## 2019-04-13 MED ORDER — HYDROCODONE-ACETAMINOPHEN 5-325 MG PO TABS
1.0000 | ORAL_TABLET | ORAL | Status: DC | PRN
  Administered 2019-04-15 – 2019-04-16 (×2): 2 via ORAL
  Filled 2019-04-13: qty 2
  Filled 2019-04-13: qty 1
  Filled 2019-04-13: qty 2
  Filled 2019-04-13: qty 1
  Filled 2019-04-13: qty 2

## 2019-04-13 MED ORDER — SUGAMMADEX SODIUM 200 MG/2ML IV SOLN
INTRAVENOUS | Status: DC | PRN
  Administered 2019-04-13: 300 mg via INTRAVENOUS

## 2019-04-13 MED ORDER — ACETAMINOPHEN 500 MG PO TABS: 1000.0000 mg | ORAL_TABLET | Freq: Once | ORAL | Status: AC

## 2019-04-13 MED ORDER — HYDROMORPHONE HCL 1 MG/ML IJ SOLN
0.2500 mg | INTRAMUSCULAR | Status: DC | PRN
  Administered 2019-04-13 (×6): 0.5 mg via INTRAVENOUS

## 2019-04-13 MED ORDER — SODIUM CHLORIDE (PF) 0.9 % IJ SOLN
INTRAMUSCULAR | Status: DC | PRN
  Administered 2019-04-13: 10 mL

## 2019-04-13 MED ORDER — DIPHENHYDRAMINE HCL 50 MG/ML IJ SOLN: 12.5000 mg | Freq: Four times a day (QID) | INTRAMUSCULAR | Status: DC | PRN

## 2019-04-13 MED ORDER — DOCUSATE SODIUM 100 MG PO CAPS
100.0000 mg | ORAL_CAPSULE | Freq: Two times a day (BID) | ORAL | Status: DC
  Administered 2019-04-14 – 2019-04-16 (×4): 100 mg via ORAL
  Filled 2019-04-13 (×5): qty 1

## 2019-04-13 MED ORDER — DIPHENHYDRAMINE HCL 12.5 MG/5ML PO ELIX: 12.5000 mg | ORAL_SOLUTION | Freq: Four times a day (QID) | ORAL | Status: DC | PRN

## 2019-04-13 MED ORDER — LIDOCAINE 2% (20 MG/ML) 5 ML SYRINGE
INTRAMUSCULAR | Status: DC | PRN
  Administered 2019-04-13: 40 mg via INTRAVENOUS

## 2019-04-13 MED ORDER — LEVOTHYROXINE SODIUM 50 MCG PO TABS
50.0000 ug | ORAL_TABLET | Freq: Every day | ORAL | Status: DC
  Administered 2019-04-15 – 2019-04-16 (×2): 50 ug via ORAL
  Filled 2019-04-13 (×2): qty 1

## 2019-04-13 MED ORDER — LIDOCAINE 2% (20 MG/ML) 5 ML SYRINGE
INTRAMUSCULAR | Status: AC
  Filled 2019-04-13: qty 5

## 2019-04-13 MED ORDER — ONDANSETRON HCL 4 MG/2ML IJ SOLN: 4.0000 mg | INTRAMUSCULAR | Status: DC | PRN

## 2019-04-13 MED ORDER — PHENYLEPHRINE HCL-NACL 10-0.9 MG/250ML-% IV SOLN
INTRAVENOUS | Status: DC | PRN
  Administered 2019-04-13: 20 ug/min via INTRAVENOUS

## 2019-04-13 MED ORDER — BELLADONNA ALKALOIDS-OPIUM 16.2-60 MG RE SUPP: 1.0000 | Freq: Four times a day (QID) | RECTAL | Status: DC | PRN

## 2019-04-13 MED ORDER — HYDROMORPHONE HCL 1 MG/ML IJ SOLN
INTRAMUSCULAR | Status: AC
  Filled 2019-04-13: qty 2

## 2019-04-13 MED ORDER — NITROGLYCERIN 0.4 MG SL SUBL: 0.4000 mg | SUBLINGUAL_TABLET | SUBLINGUAL | Status: DC | PRN

## 2019-04-13 MED ORDER — BACITRACIN-NEOMYCIN-POLYMYXIN 400-5-5000 EX OINT: 1.0000 "application " | TOPICAL_OINTMENT | Freq: Three times a day (TID) | CUTANEOUS | Status: DC | PRN

## 2019-04-13 MED ORDER — ONDANSETRON HCL 4 MG/2ML IJ SOLN
INTRAMUSCULAR | Status: AC
  Filled 2019-04-13: qty 2

## 2019-04-13 MED ORDER — FENTANYL CITRATE (PF) 100 MCG/2ML IJ SOLN
INTRAMUSCULAR | Status: DC | PRN
  Administered 2019-04-13: 100 ug via INTRAVENOUS
  Administered 2019-04-13 (×3): 50 ug via INTRAVENOUS

## 2019-04-13 MED ORDER — DEXTROSE-NACL 5-0.45 % IV SOLN: INTRAVENOUS | Status: DC

## 2019-04-13 MED ORDER — ROCURONIUM BROMIDE 10 MG/ML (PF) SYRINGE
PREFILLED_SYRINGE | INTRAVENOUS | Status: AC
  Filled 2019-04-13: qty 10

## 2019-04-13 MED ORDER — EPHEDRINE SULFATE-NACL 50-0.9 MG/10ML-% IV SOSY
PREFILLED_SYRINGE | INTRAVENOUS | Status: DC | PRN
  Administered 2019-04-13: 5 mg via INTRAVENOUS

## 2019-04-13 MED ORDER — BUPIVACAINE LIPOSOME 1.3 % IJ SUSP
20.0000 mL | Freq: Once | INTRAMUSCULAR | Status: AC
  Administered 2019-04-13: 40 mL
  Filled 2019-04-13: qty 20

## 2019-04-13 MED ORDER — STERILE WATER FOR IRRIGATION IR SOLN
Status: DC | PRN
  Administered 2019-04-13: 1000 mL

## 2019-04-13 MED ORDER — LACTATED RINGERS IR SOLN
Status: DC | PRN
  Administered 2019-04-13: 1000 mL

## 2019-04-13 MED ORDER — SODIUM CHLORIDE 0.9% IV SOLUTION: Freq: Once | INTRAVENOUS | Status: DC

## 2019-04-13 MED ORDER — ACETAMINOPHEN 500 MG PO TABS
1000.0000 mg | ORAL_TABLET | Freq: Four times a day (QID) | ORAL | Status: AC
  Administered 2019-04-14: 1000 mg via ORAL
  Filled 2019-04-13 (×2): qty 2

## 2019-04-13 MED ORDER — HYDROCODONE-ACETAMINOPHEN 5-325 MG PO TABS: 1.0000 | ORAL_TABLET | Freq: Four times a day (QID) | ORAL | 0 refills | Status: DC | PRN

## 2019-04-13 MED ORDER — PROPOFOL 10 MG/ML IV BOLUS
INTRAVENOUS | Status: AC
  Filled 2019-04-13: qty 20

## 2019-04-13 MED ORDER — ACETAMINOPHEN 500 MG PO TABS
ORAL_TABLET | ORAL | Status: AC
  Administered 2019-04-13: 1000 mg via ORAL
  Filled 2019-04-13: qty 2

## 2019-04-13 MED ORDER — ONDANSETRON HCL 4 MG/2ML IJ SOLN
INTRAMUSCULAR | Status: DC | PRN
  Administered 2019-04-13: 4 mg via INTRAVENOUS

## 2019-04-13 MED ORDER — HYDROMORPHONE HCL 1 MG/ML IJ SOLN
0.5000 mg | INTRAMUSCULAR | Status: DC | PRN
  Administered 2019-04-13 – 2019-04-16 (×9): 1 mg via INTRAVENOUS
  Filled 2019-04-13 (×9): qty 1

## 2019-04-13 MED ORDER — FENTANYL CITRATE (PF) 250 MCG/5ML IJ SOLN
INTRAMUSCULAR | Status: AC
  Filled 2019-04-13: qty 5

## 2019-04-13 SURGICAL SUPPLY — 66 items
ADH SKN CLS APL DERMABOND .7 (GAUZE/BANDAGES/DRESSINGS) ×2
APL PRP STRL LF DISP 70% ISPRP (MISCELLANEOUS) ×2
BAG DRN RND TRDRP ANRFLXCHMBR (UROLOGICAL SUPPLIES) ×1
BAG LAPAROSCOPIC 12 15 PORT 16 (BASKET) ×2 IMPLANT
BAG RETRIEVAL 12/15 (BASKET) ×3
BAG RETRIEVAL 12/15MM (BASKET) ×1
BAG URINE DRAIN 2000ML AR STRL (UROLOGICAL SUPPLIES) ×4 IMPLANT
CHLORAPREP W/TINT 26 (MISCELLANEOUS) ×4 IMPLANT
CLIP VESOLOCK LG 6/CT PURPLE (CLIP) ×4 IMPLANT
CLIP VESOLOCK MED LG 6/CT (CLIP) ×4 IMPLANT
CLIP VESOLOCK XL 6/CT (CLIP) ×4 IMPLANT
COVER SURGICAL LIGHT HANDLE (MISCELLANEOUS) ×4 IMPLANT
COVER TIP SHEARS 8 DVNC (MISCELLANEOUS) ×2 IMPLANT
COVER TIP SHEARS 8MM DA VINCI (MISCELLANEOUS) ×4
COVER WAND RF STERILE (DRAPES) IMPLANT
CUTTER ECHEON FLEX ENDO 45 340 (ENDOMECHANICALS) ×4 IMPLANT
DECANTER SPIKE VIAL GLASS SM (MISCELLANEOUS) ×4 IMPLANT
DERMABOND ADVANCED (GAUZE/BANDAGES/DRESSINGS) ×4
DERMABOND ADVANCED .7 DNX12 (GAUZE/BANDAGES/DRESSINGS) ×4 IMPLANT
DRAIN CHANNEL 15F RND FF 3/16 (WOUND CARE) ×4 IMPLANT
DRAPE ARM DVNC X/XI (DISPOSABLE) ×8 IMPLANT
DRAPE COLUMN DVNC XI (DISPOSABLE) ×2 IMPLANT
DRAPE DA VINCI XI ARM (DISPOSABLE) ×16
DRAPE DA VINCI XI COLUMN (DISPOSABLE) ×4
DRAPE INCISE IOBAN 66X45 STRL (DRAPES) ×4 IMPLANT
DRAPE SHEET LG 3/4 BI-LAMINATE (DRAPES) ×4 IMPLANT
ELECT REM PT RETURN 15FT ADLT (MISCELLANEOUS) ×4 IMPLANT
EVACUATOR SILICONE 100CC (DRAIN) IMPLANT
GLOVE BIO SURGEON STRL SZ 6.5 (GLOVE) ×3 IMPLANT
GLOVE BIO SURGEONS STRL SZ 6.5 (GLOVE) ×1
GLOVE BIOGEL M STRL SZ7.5 (GLOVE) ×8 IMPLANT
GOWN STRL REUS W/TWL LRG LVL3 (GOWN DISPOSABLE) ×12 IMPLANT
IRRIG SUCT STRYKERFLOW 2 WTIP (MISCELLANEOUS) ×4
IRRIGATION SUCT STRKRFLW 2 WTP (MISCELLANEOUS) ×2 IMPLANT
KIT BASIN OR (CUSTOM PROCEDURE TRAY) ×4 IMPLANT
KIT TURNOVER KIT A (KITS) IMPLANT
MARKER SKIN DUAL TIP RULER LAB (MISCELLANEOUS) ×4 IMPLANT
NEEDLE INSUFFLATION 14GA 120MM (NEEDLE) ×4 IMPLANT
NS IRRIG 1000ML POUR BTL (IV SOLUTION) ×4 IMPLANT
PENCIL SMOKE EVACUATOR (MISCELLANEOUS) IMPLANT
PORT ACCESS TROCAR AIRSEAL 12 (TROCAR) ×2 IMPLANT
PORT ACCESS TROCAR AIRSEAL 5M (TROCAR) ×2
PROTECTOR NERVE ULNAR (MISCELLANEOUS) ×8 IMPLANT
RELOAD STAPLER WHITE 60MM (STAPLE) IMPLANT
SEAL CANN UNIV 5-8 DVNC XI (MISCELLANEOUS) ×8 IMPLANT
SEAL XI 5MM-8MM UNIVERSAL (MISCELLANEOUS) ×16
SET TRI-LUMEN FLTR TB AIRSEAL (TUBING) ×4 IMPLANT
SOLUTION ELECTROLUBE (MISCELLANEOUS) ×4 IMPLANT
STAPLE RELOAD 45 WHT (STAPLE) ×8 IMPLANT
STAPLE RELOAD 45MM WHITE (STAPLE) ×16
STAPLER RELOAD WHITE 60MM (STAPLE)
SUT ETHILON 3 0 PS 1 (SUTURE) ×4 IMPLANT
SUT MNCRL AB 4-0 PS2 18 (SUTURE) ×8 IMPLANT
SUT PDS AB 1 CT1 27 (SUTURE) ×16 IMPLANT
SUT VIC AB 2-0 SH 27 (SUTURE) ×4
SUT VIC AB 2-0 SH 27X BRD (SUTURE) ×2 IMPLANT
SUT VLOC BARB 180 ABS3/0GR12 (SUTURE) ×4
SUTURE VLOC BRB 180 ABS3/0GR12 (SUTURE) ×2 IMPLANT
TOWEL OR 17X26 10 PK STRL BLUE (TOWEL DISPOSABLE) ×4 IMPLANT
TOWEL OR NON WOVEN STRL DISP B (DISPOSABLE) ×4 IMPLANT
TRAY FOLEY MTR SLVR 16FR STAT (SET/KITS/TRAYS/PACK) IMPLANT
TRAY LAPAROSCOPIC (CUSTOM PROCEDURE TRAY) ×4 IMPLANT
TROCAR BLADELESS OPT 5 100 (ENDOMECHANICALS) ×4 IMPLANT
TROCAR UNIVERSAL OPT 12M 100M (ENDOMECHANICALS) ×4 IMPLANT
TROCAR XCEL 12X100 BLDLESS (ENDOMECHANICALS) ×4 IMPLANT
WATER STERILE IRR 1000ML POUR (IV SOLUTION) ×4 IMPLANT

## 2019-04-13 NOTE — Transfer of Care (Signed)
Immediate Anesthesia Transfer of Care Note  Patient: Charles Wright  Procedure(s) Performed: XI ROBOT ASSITED LAPAROSCOPIC NEPHROURETERECTOMY (Left ) LYMPH NODE DISSECTION (Bilateral )  Patient Location: PACU  Anesthesia Type:General  Level of Consciousness: awake and patient cooperative  Airway & Oxygen Therapy: Patient Spontanous Breathing and Patient connected to face mask  Post-op Assessment: Report given to RN and Post -op Vital signs reviewed and stable  Post vital signs: Reviewed and stable  Last Vitals:  Vitals Value Taken Time  BP 119/71 04/13/19 1742  Temp    Pulse 62 04/13/19 1745  Resp 12 04/13/19 1745  SpO2 100 % 04/13/19 1745  Vitals shown include unvalidated device data.  Last Pain:  Vitals:   04/13/19 1309  TempSrc:   PainSc: 0-No pain         Complications: No apparent anesthesia complications

## 2019-04-13 NOTE — Anesthesia Postprocedure Evaluation (Signed)
Anesthesia Post Note  Patient: Charles Wright  Procedure(s) Performed: XI ROBOT ASSITED LAPAROSCOPIC NEPHROURETERECTOMY (Left ) LYMPH NODE DISSECTION (Bilateral )     Patient location during evaluation: PACU Anesthesia Type: General Level of consciousness: awake and alert Pain management: pain level controlled Vital Signs Assessment: post-procedure vital signs reviewed and stable Respiratory status: spontaneous breathing, nonlabored ventilation, respiratory function stable and patient connected to nasal cannula oxygen Cardiovascular status: blood pressure returned to baseline and stable Postop Assessment: no apparent nausea or vomiting Anesthetic complications: no    Last Vitals:  Vitals:   04/13/19 1800 04/13/19 1815  BP: 111/70 119/64  Pulse: (!) 56 (!) 55  Resp: 11 11  Temp:    SpO2: 100% 100%    Last Pain:  Vitals:   04/13/19 1811  TempSrc:   PainSc: 7                  Melitta Tigue,W. EDMOND

## 2019-04-13 NOTE — Op Note (Signed)
NAMEMarland Wright YEAB, CODD MEDICAL RECORD [id number] ACCOUNT [id number] DATE OF BIRTH:September 10, 1935 FACILITY: [facility] LOCATION: [id number] PHYSICIAN:Chae Oommen, MD  OPERATIVE REPORT  DATE OF PROCEDURE:  04/13/2019  PREOPERATIVE DIAGNOSES:   1.  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 2.  Recurrent hematuria from left kidney.   3.  Known left ureteral stricture disease.  PROCEDURE: 1.  Left robotic-assisted laparoscopic nephroureterectomy. 2.  Periaortic retroperitoneal lymph node dissection.  SURGEON:  Alexis Frock, MD  ASSISTANT:  Debbrah Alar, PA  FINDINGS: 1.  Single artery, single vein left renal vascular anatomy was dissipated. 2.  Significant desmoplastic reaction in the retroperitoneum consistent with multiple prior procedures and prior stenting.    ESTIMATED BLOOD LOSS:  190 mL.  DRAINS:   1.  Jackson-Pratt drain to suction. 2.  Foley catheter to straight drain.  SPECIMENS:   1.  Left kidney, plus ureter, plus bladder cuff en bloc. 2.  Periaortic lymph nodes, all for permanent pathology.  INDICATIONS:  The patient is a pleasant 84 year old man with longstanding history of left ureteral stricture.  He has been managed with chronic stenting.  He also has a history of prostate cancer, status post radiation.  He began developing worsening  hematuria that localized to his left kidney.  This became hemodynamically significant.  He has been requiring transfusions, symptomatic orthostasis.  He had several imaging studies with high suspicion of a left upper tract urothelial carcinoma, as well  as ureteroscopy which corroborated visually such findings.  Pathology was at least atypical.  Options were discussed for management including continued chronic stenting versus attempts at ablative therapy versus definitive management with left  nephroureterectomy and he adamantly wished to proceed with the latter.  We had discussed beforehand that in fact he may have benign  disease, but we all agreed that given the ongoing bleeding, which was hemodynamically significant, that this approach  would be warranted regardless.  Informed consent was obtained and placed in the medical record.  DESCRIPTION OF PROCEDURE:  Until patient being identified, the procedure being left robotic nephroureterectomy was confirmed.  Procedure timeout was performed.  Intravenous antibiotics administered.  General endotracheal anesthesia introduced.  The  patient was placed into a left side up, full flank position, pulling 15 degrees of table flexion, superior arm elevator, axillary roll, sequential compression devices, bottom leg bent, top leg straight.  He was further fastened to the operative table  using 3-inch tape over foam padding across the supraxiphoid chest and his pelvis after Foley catheter was exchanged, placed free to straight drain, 10 mL around the balloon.  Beanbag was deployed.  A superior elevator was used.  Sterile field was  created, prepping and draping the patient's entire left flank and abdomen using chlorhexidine gluconate.  Next, a high-flow, low-pressure pneumoperitoneum was obtained using Veress technique in the left lower quadrant, having passed the aspiration and  drop test.  An 8 mm robotic camera port was then placed in position approximately 1 handbreadth superolateral to the umbilicus.  Laparoscopic examination of the peritoneal cavity revealed no significant adhesions, no visceral injury.  Additional ports  were then placed as follows:  Left subcostal 8 mm robotic port, left far lateral 8 mm robotic port approximately 4 fingerbreadths superior and medial to the anterior iliac spine, left paramedian inferior robotic port approximately 4 fingerbreadths  superior to the pubic ramus and two 12 mm assistant port sites in the midline, 1 in the supraumbilical crease and another approximately 2 fingerbreadths above the plane of  the camera port.  Robot was docked and passed  the electronic checks.  Initial  attention was directed at developing the retroperitoneum.  Incision was made lateral to the descending colon from the area of the splenic flexure towards the area of the internal ring.  The colon was carefully swept medially.  Notably, the patient had a  very large portion of retroperitoneal fat and retroperitoneal planes were somewhat desmoplastic, consistent with multiple prior endoscopic procedures.  Lateral splenic attachments were taken down allowing the spleen to self-rotate and retracted medially  and the plane between the inferior aspect of the pancreas and Gerota fascia was also carefully developed to allow the spleen and pancreas to rotate medially, self-retracting.  The lower pole kidney area was identified, placed on gentle lateral traction.   Dissection proceeded medial to this.  The ureter and gonadal vessels were encountered and placed on gentle lateral traction.  Dissection continued medial and posterior and the psoas musculature was visualized.  Dissection proceeded within this triangle  towards the area of the renal hilum.  Renal hilum consisted of a single artery, single vein renal vascular anatomy as anticipated.  The artery was circumferentially mobilized and then controlled using an extra-large Hem-o-Lok clip proximal, followed by  vascular staple distal.  The vein was carefully mobilized circumferentially and controlled with vascular stapler.  This resulted in excellent hemostatic control of the hilum.  Dissection proceeded superiorly above the area of the hilum in a partial  adrenal sparing technique.  Superior attachments were taken down with cautery scissors, as were lateral attachments, such that the kidney just remained on the pedicle of the ureter.  The area of the aorta inferior to the renal hilum was once again  visualized and the periaortic tissue compromising periaortic lymph nodes was carefully dissected away from the area of the hilum  inferiorly for a distance of approximately 6 cm, thus performing periaortic lymph node dissection given the fact that the  patient did have some borderline lymph nodes in this area on prior imaging to maximally rule out metastatic disease.  This was set aside labeled as periaortic lymph nodes.  The ureter was then circumferentially mobilized distally to the area of the true  pelvis, crossing the left medial umbilical ligament.  The bladder was identified.  A peritoneal flap was created just medial to this and the ureter was traced all the way to the ureterovesical junction.  Again, this area was quite desmoplastic consistent  with multiple prior procedures, but I was very satisfied with our dissection of the distal ureter to the level of the intramural ureter.  A 3-0 V-Loc stitch was applied on the medial aspect of this.  The distal ureter was clipped to prevent any overt  antegrade progression of tumor cells and final bladder cuff dissection was carefully performed, taking exquisite care not to violate the stent, which did not occur.  The left kidney, plus ureter, plus bladder cuff en bloc specimen was then placed in an  extra-large EndoCatch bag for later retrieval.  The bladder cuff was oversewn by using the previously placed 3-0 V-Loc and running this laterally which closed the small bladder defect.  At this point, all sponge, needle counts were correct.  Hemostasis  appeared excellent.  We achieved the goals of the extirpative portion of the procedure today.  Given the very large specimen, an incision was made to connect the 2 assistant port sites.  Before undocking, a closed suction drain was brought through the  previous  left most port site near the area of the peritoneal cavity.  Robot was then undocked.  Specimen was then retrieved by connecting the two 12 mm assistant port sites in the midline, removing the kidney, plus ureter, plus bladder cuff specimen en  bloc, setting it aside for permanent  pathology.  Extraction site was closed in the fascia using a figure-of-eight PDS x8, followed by reapproximation of Scarpa's with running Vicryl.  All incision sites were infiltrated with dilute lipolyzed Marcaine and  closed at the level of the skin using subcuticular Monocryl, followed by Dermabond.  The procedure was then terminated.  The patient tolerated the procedure well.  No immediate perioperative complications.  The patient was taken to postanesthesia care  unit in stable condition.  Plan for inpatient admission.  Please note, first assistant Debbrah Alar was crucial for all portions of the procedure today.  She provided invaluable retraction, specimen manipulation, vascular clipping, vascular stapling and general first assistance.  VN/NUANCE  D:04/13/2019 T:04/13/2019 JOB:[id number]

## 2019-04-13 NOTE — Brief Op Note (Signed)
04/13/2019  5:26 PM  PATIENT:  [name]  84 y.o. male  PRE-OPERATIVE DIAGNOSIS:  LEFT RENAL PELVIS MASS, BLEEDING  POST-OPERATIVE DIAGNOSIS:  LEFT RENAL PELVIS MASS, BLEEDING  PROCEDURE:  Procedure(s) with comments: XI ROBOT ASSITED LAPAROSCOPIC NEPHROURETERECTOMY (Left) - 3 HRS LYMPH NODE DISSECTION (Bilateral)  SURGEON:  Surgeon(s) and Role:    Alexis Frock, MD - Primary  PHYSICIAN ASSISTANT:   ASSISTANTS: Debbrah Alar PA   ANESTHESIA:   general  EBL:  190 mL   BLOOD ADMINISTERED:none  DRAINS: 1 - foley to gravity; 2 - JP to bulb   LOCAL MEDICATIONS USED:  MARCAINE     SPECIMEN:  Source of Specimen:  1 - left kidney + ureter + bladder cuff en bloc; 2 - peri-aortic lymph nodes  DISPOSITION OF SPECIMEN:  PATHOLOGY  COUNTS:  YES  TOURNIQUET:  * No tourniquets in log *  DICTATION: .Other Dictation: Dictation Number [id number]  PLAN OF CARE: Admit to inpatient   PATIENT DISPOSITION:  PACU - hemodynamically stable.   Delay start of Pharmacological VTE agent (>24hrs) due to surgical blood loss or risk of bleeding: yes

## 2019-04-13 NOTE — Anesthesia Preprocedure Evaluation (Addendum)
Anesthesia Evaluation  Patient identified by MRN, date of birth, ID band Patient awake    Reviewed: Allergy & Precautions, H&P , NPO status , Patient's Chart, lab work & pertinent test results, reviewed documented beta blocker date and time   Airway Mallampati: II  TM Distance: >3 FB Neck ROM: Full    Dental no notable dental hx. (+) Teeth Intact, Dental Advisory Given   Pulmonary sleep apnea ,    Pulmonary exam normal breath sounds clear to auscultation       Cardiovascular hypertension, Pt. on home beta blockers and Pt. on medications + CAD and + Past MI  + dysrhythmias Atrial Fibrillation  Rhythm:Regular Rate:Normal     Neuro/Psych negative neurological ROS  negative psych ROS   GI/Hepatic Neg liver ROS, GERD  ,  Endo/Other  Hypothyroidism   Renal/GU negative Renal ROS  negative genitourinary   Musculoskeletal  (+) Arthritis , Osteoarthritis,    Abdominal   Peds  Hematology negative hematology ROS (+) anemia ,   Anesthesia Other Findings   Reproductive/Obstetrics negative OB ROS                            Anesthesia Physical Anesthesia Plan  ASA: III  Anesthesia Plan: General   Post-op Pain Management:    Induction: Intravenous  PONV Risk Score and Plan: 3 and Ondansetron, Dexamethasone and Treatment may vary due to age or medical condition  Airway Management Planned: Oral ETT  Additional Equipment:   Intra-op Plan:   Post-operative Plan: Extubation in OR  Informed Consent: I have reviewed the patients History and Physical, chart, labs and discussed the procedure including the risks, benefits and alternatives for the proposed anesthesia with the patient or authorized representative who has indicated his/her understanding and acceptance.     Dental advisory given  Plan Discussed with: CRNA  Anesthesia Plan Comments:         Anesthesia Quick Evaluation

## 2019-04-13 NOTE — Anesthesia Procedure Notes (Signed)
Procedure Name: Intubation Date/Time: 04/13/2019 2:02 PM Performed by: West Pugh, CRNA Pre-anesthesia Checklist: Patient identified, Emergency Drugs available, Suction available, Patient being monitored and Timeout performed Patient Re-evaluated:Patient Re-evaluated prior to induction Oxygen Delivery Method: Circle system utilized Preoxygenation: Pre-oxygenation with 100% oxygen Induction Type: IV induction Ventilation: Mask ventilation without difficulty Laryngoscope Size: Mac and 3 Grade View: Grade I Tube type: Oral Tube size: 7.5 mm Airway Equipment and Method: Stylet Placement Confirmation: ETT inserted through vocal cords under direct vision,  positive ETCO2,  CO2 detector and breath sounds checked- equal and bilateral Secured at: 21 cm Tube secured with: Tape Dental Injury: Teeth and Oropharynx as per pre-operative assessment

## 2019-04-13 NOTE — H&P (Signed)
Charles Wright is an 84 y.o. male.    Chief Complaint: Pre-op LEFT Nephroureterectomy  HPI:   1 - LEFT Renal Pelvis Neoplasm with Refractory Hematuria - progressive hematuria late 2020-early 2021. Eval with left renal pelvis mass by imaging and ureteroscopy. Path atypical. Several admissions for symptomatic hematuria with clots, anemia. 1 artery / 1 vein left renovascular anatomy.  Few borderline peri-aortic nodes.  Left JJ stent in situ.   2 - Prostate Cancer - s/p primary radiation. PSA 2020 .05  PMH sig for CAD/MI (med [place] only)  Today " Charles Wright" is seen to proceed with semi-elective left nephroureterectomy for refracotry bleeding upper tract neoplasm. No interval fevers. Hgb 9.5, C19 screen negative, Cr 1.3.   Past Medical History:  Diagnosis Date  . Acute gout of left ankle    06-14-2015  . Bladder cancer (HCC)    BCG's tx's  . BPH (benign prostatic hypertrophy)   . CAD (coronary artery disease) CARDIOLOGIST-  DR TILLEY   a. NSTEMI 12/2014 -  99% dLAD-2 (not amenable to PCI), 50% dLAD-1, 60% mLAD. Medical therapy was recommended.  . Cough    HARSH NONPRODUCTIVE COUGH 1 AND 1/2 WEEKS  . GERD (gastroesophageal reflux disease)    takes  OTC periodically  . Gilbert's syndrome   . H/O cardiac catheterization    a. Note: Difficult radial access in 12/2014 - recommend femoral approach if cath needed in the future.  Marland Kitchen History of kidney stones   . History of non-ST elevation myocardial infarction (NSTEMI)    12-12-2014   CARDIAC CATH W/ NO INTERVENTION X 2FEW DAYS APART  . History of spinal fracture 2002   LUMBAR  . Hyperlipidemia   . Hypertension   . Hypothyroidism   . Myocardial infarction ([place])   . OSA on CPAP    SET ON 7 USES SOME NIGHTS  . Paroxysmal A-fib [facility])    cardiologist-  dr Wynonia Lawman  . Prostate cancer ([place]) 2019   last PSA  2.9  (montiored by urologist  dr Junious Silk) [place] FEB 2019 RAD DONE  . Shingles    2 weeks ago  . Wears glasses     Past Surgical History:   Procedure Laterality Date  . BACK SURGERY  2014   LOWER l 1, 2 4 AND 5  . BILATERAL INGUINAL HERNIA REPAIR    . CARDIAC CATHETERIZATION N/A 12/13/2014   Procedure: Left Heart Cath and Coronary Angiography;  Surgeon: Leonie Man, MD;  Location: [facility];  Service: Cardiovascular;  Laterality: N/A;  culprit lesion diat LAD-2  99%, not approachable via PCTA  due to extremely tortuous up stream LAD/  mLAD 60% and dLAD-1 50%, both are at the extremely tortuous segment and not PCI targets/  otherwise normal coronary arteries  . COLONOSCOPY WITH PROPOFOL N/A 01/16/2019   Procedure: COLONOSCOPY WITH PROPOFOL;  Surgeon: Clarene Essex, MD;  [facility]: [facility];  Service: Endoscopy;  Laterality: N/A;  . CYSTOSCOPY W/ RETROGRADES Left 08/22/2012   Procedure: CYSTOSCOPY WITH RETROGRADE PYELOGRAM;  left kidney washings;  Surgeon: Fredricka Bonine, MD;  [facility];  Service: Urology;  Laterality: Left;  . CYSTOSCOPY W/ RETROGRADES N/A 07/08/2015   Procedure: CYSTOSCOPY WITH RETROGRADE PYELOGRAM;  Surgeon: [name], MD;  Location: [facility];  Service: Urology;  Laterality: N/A;  . CYSTOSCOPY W/ URETERAL STENT PLACEMENT Left 03/24/2019   Procedure: CYSTOSCOPY WITH RETROGRADE PYELOGRAM/URETERAL STENT PLACEMENT ureteroscopy biopsy of neoplasm;  Surgeon: [name], MD;  [facility];  Service: Urology;  Laterality: Left;  . CYSTOSCOPY W/ URETERAL STENT PLACEMENT Left 04/08/2019   Procedure: CYSTOSCOPY cystogram clot evacuation left ureteroscopy clot evacuation left stent exchange liimited transuretheral resectio of prastate and fulguration  bleeders of prostate;  Surgeon: [name], MD;  [facility];  Service: Urology;  Laterality: Left;  . CYSTOSCOPY WITH BIOPSY  08/22/2012   Procedure: CYSTOSCOPY WITH BIOPSY;  Surgeon: Fredricka Bonine, MD;  Location: [facility];  Service: Urology;;  . [name] WITH  BIOPSY N/A 11/08/2014   Procedure: CYSTOSCOPY/BIOPSPY BLADDER INSTILLATION OF MARCAINE AND PYRIDIUM;  Surgeon: [name], MD;  Location: [facility];  Service: Urology;  Laterality: N/A;  . CYSTOSCOPY WITH BIOPSY N/A 07/08/2015   Procedure: CYSTOSCOPY WITH BLADDER BIOPSY, FULGURATION, RETROGRADE PYLOGRAM AND RENAL WASHING;  Surgeon: [name], MD;  Location: [facility];  Service: Urology;  Laterality: N/A;  . CYSTOSCOPY WITH RETROGRADE PYELOGRAM, URETEROSCOPY AND STENT PLACEMENT Bilateral 07/30/2014   Procedure: CYSTOSCOPY WITH BLADDER BX FULERGATION AND BILATERAL RETROGRADE PYELOGRAM,;  Surgeon: [name], MD;  [facility];  Service: Urology;  Laterality: Bilateral;  . CYSTOSCOPY WITH STENT PLACEMENT Left 03/24/2018   Procedure: STENT PLACEMENT AND BIOPSY;  Surgeon: [name], MD;  Location: [facility];  Service: Urology;  Laterality: Left;  . CYSTOSCOPY/RETROGRADE/URETEROSCOPY Bilateral 08/17/2013   Procedure: CYSTOSCOPY, BLADDER BIOPSY WITH BILATERAL RETROGRADE PYELOGRAM, LEFT URETEROSCOPY AND DILATION OF STRICTURE ;  Surgeon: [name], MD;  Location: [facility];  Service: Urology;  Laterality: Bilateral;  . CYSTOSCOPY/RETROGRADE/URETEROSCOPY Left 03/24/2018   Procedure: CYSTOSCOPY/RETROGRADE/URETEROSCOPY;  Surgeon: [name], MD;  Location: [facility];  Service: Urology;  Laterality: Left;  . HOT HEMOSTASIS N/A 01/16/2019   Procedure: HOT HEMOSTASIS (ARGON PLASMA COAGULATION/BICAP);  Surgeon: Clarene Essex, MD;  Location: [facility];  Service: Endoscopy;  Laterality: N/A;  . LEFT ATRIAL APPENDAGE OCCLUSION N/A 01/09/2015   Procedure: LEFT ATRIAL APPENDAGE OCCLUSION;  Surgeon: [name], MD;  Location: [facility];  Service: Cardiovascular;  Laterality: N/A;  . LUMBAR LAMINECTOMY/DECOMPRESSION MICRODISCECTOMY Left 12/03/2013   Procedure: Left Lumbar three-four  diskectomy with Lumbar four laminectomy ;  Surgeon: Newman Pies, MD;  Location: [facility];  Service: Neurosurgery;  Laterality: Left;  Left Lumbar three-four diskectomy with Lumbar four laminectomy   . ORIF LEFT ANKLE FX  2005  . POLYPECTOMY  01/16/2019   Procedure: POLYPECTOMY;  Surgeon: Clarene Essex, MD;  [facility];  Service: Endoscopy;;  . PROSTATE BIOPSY N/A 08/22/2012   Procedure: BIOPSY TRANSRECTAL ULTRASONIC PROSTATE (TUBP);  Surgeon: Fredricka Bonine, MD;  Location: [facility];  Service: Urology;  Laterality: N/A;  . TEE WITHOUT CARDIOVERSION N/A 12/31/2014   Procedure: TRANSESOPHAGEAL ECHOCARDIOGRAM (TEE);  Surgeon: Pixie Casino, MD;  Location: [facility];  Service: Cardiovascular;  Laterality: N/A;  ef 55-60%/  mild MR, TR, and PR/  mild LAE and RAE  . TRANSURETHRAL RESECTION OF BLADDER TUMOR  10/22/2011   Procedure: TRANSURETHRAL RESECTION OF BLADDER TUMOR (TURBT);  Surgeon: Fredricka Bonine, MD;  Location: [facility];  Service: Urology;  Laterality: N/A;  TURBT, LEFT URETEROSCOPY, POSSIBLE URETERAL STENT   . URETEROSCOPY  10/22/2011   Procedure: URETEROSCOPY;  Surgeon: Fredricka Bonine, MD;  Location: [facility];  Service: Urology;  Laterality: Left;  . WISDOM TOOTH EXTRACTION      Family History  Problem Relation Age of Onset  . Leukemia Mother   .  Cancer Mother        leukemia  . Heart attack Father   . Cancer Sister        breast  . Hypertension Neg Hx   . Stroke Neg Hx    Social History:  reports that he has never smoked. He has never used smokeless tobacco. He reports current alcohol use of about 7.0 standard drinks of alcohol per week. He reports that he does not use drugs.  Allergies:  Allergies  Allergen Reactions  . Bee Venom Anaphylaxis    Actually Hornets and Wasps.   . Losartan Other (See Comments)    Cough   . Oxycodone Other (See Comments)    ALTERED MENTAL STATUS  GETS MEAN    No medications prior to admission.    Results for orders placed or performed during the hospital encounter of 04/12/19 (from the past 48 hour(s))  Basic metabolic panel     Status: Abnormal   Collection Time: 04/12/19  4:18 PM  Result Value Ref Range   Sodium 137 135 - 145 mmol/L   Potassium 4.2 3.5 - 5.1 mmol/L   Chloride 106 98 - 111 mmol/L   CO2 23 22 - 32 mmol/L   Glucose, Bld 105 (H) 70 - 99 mg/dL    Comment: Glucose reference range applies only to samples taken after fasting for at least 8 hours.   BUN 24 (H) 8 - 23 mg/dL   Creatinine, Ser 1.35 (H) 0.61 - 1.24 mg/dL   Calcium 8.4 (L) 8.9 - 10.3 mg/dL   GFR calc non Af Amer 49 (L) >60 mL/min   GFR calc Af Amer 56 (L) >60 mL/min   Anion gap 8 5 - 15    Comment: Performed at [facility], [place] [address], [place], [place] [address]  CBC     Status: Abnormal   Collection Time: 04/12/19  4:18 PM  Result Value Ref Range   WBC 10.6 (H) 4.0 - 10.5 K/uL   RBC 3.21 (L) 4.22 - 5.81 MIL/uL   Hemoglobin 9.5 (L) 13.0 - 17.0 g/dL   HCT 29.5 (L) 39.0 - 52.0 %   MCV 91.9 80.0 - 100.0 fL   MCH 29.6 26.0 - 34.0 pg   MCHC 32.2 30.0 - 36.0 g/dL   RDW 13.6 11.5 - 15.5 %   Platelets 378 150 - 400 K/uL   nRBC 0.0 0.0 - 0.2 %    Comment: Performed at [facility], [place] [address] [place], [place] [address]   No results found.  Review of Systems  Constitutional: Negative for fever.  Genitourinary: Positive for hematuria.  All other systems reviewed and are negative.   There were no vitals taken for this visit. Physical Exam  Constitutional: He appears well-developed.  HENT:  Head: Normocephalic.  Eyes: Pupils are equal, round, and reactive to light.  Cardiovascular: Normal rate.  Respiratory: Effort normal.  GI: Soft.  Genitourinary:    Penis normal.   Musculoskeletal:        General: Edema present.     Comments: Symmetric LE edma to calf that is soft      Assessment/Plan  Proceed as planned with LEFT nephroureterectomy with curative intent. Risks, benefits, alternatives, expected peri-op course discussed previously and reiterated today.     Alexis Frock, MD 04/13/2019, 8:07 AM

## 2019-04-14 LAB — BASIC METABOLIC PANEL
Anion gap: 6 (ref 5–15)
BUN: 19 mg/dL (ref 8–23)
CO2: 25 mmol/L (ref 22–32)
Calcium: 8 mg/dL — ABNORMAL LOW (ref 8.9–10.3)
Chloride: 105 mmol/L (ref 98–111)
Creatinine, Ser: 1.41 mg/dL — ABNORMAL HIGH (ref 0.61–1.24)
GFR calc Af Amer: 53 mL/min — ABNORMAL LOW (ref >60)
GFR calc non Af Amer: 46 mL/min — ABNORMAL LOW (ref >60)
Glucose, Bld: 111 mg/dL — ABNORMAL HIGH (ref 70–99)
Potassium: 4.6 mmol/L (ref 3.5–5.1)
Sodium: 136 mmol/L (ref 135–145)

## 2019-04-14 LAB — HEMOGLOBIN AND HEMATOCRIT, BLOOD
HCT: 28.1 % — ABNORMAL LOW (ref 39.0–52.0)
Hemoglobin: 8.8 g/dL — ABNORMAL LOW (ref 13.0–17.0)

## 2019-04-14 MED ORDER — CHLORHEXIDINE GLUCONATE CLOTH 2 % EX PADS
6.0000 | MEDICATED_PAD | Freq: Every day | CUTANEOUS | Status: DC
  Administered 2019-04-14 – 2019-04-15 (×2): 6 via TOPICAL

## 2019-04-14 MED ORDER — ACETAMINOPHEN 325 MG PO TABS
650.0000 mg | ORAL_TABLET | Freq: Once | ORAL | Status: AC
  Administered 2019-04-14: 650 mg via ORAL
  Filled 2019-04-14: qty 2

## 2019-04-14 NOTE — Progress Notes (Signed)
1 Day Post-Op Subjective: The patient is doing well.  No nausea or vomiting. Passing gas already. Pain is adequately controlled.  Objective: Vital signs in last 24 hours: Temp:  [97.4 F (36.3 C)-97.8 F (36.6 C)] 97.7 F (36.5 C) (03/06 0812) Pulse Rate:  [55-65] 63 (03/06 0812) Resp:  [11-18] 18 (03/06 0812) BP: (110-119)/(60-71) 114/65 (03/06 0812) SpO2:  [98 %-100 %] 100 % (03/06 0812) Weight:  [98.1 kg] 98.1 kg (03/05 1251)  Intake/Output from previous day: 03/05 0701 - 03/06 0700 In: 2413 [P.O.:120; I.V.:2043; IV Piggyback:100] Out: 1365 [Urine:1175; Blood:190] Intake/Output this shift: Total I/O In: 120 [P.O.:120] Out: 300 [Urine:200; Drains:100]  Physical Exam:  General: Alert and oriented. CV: Regular rate Lungs: Normal work of breathing GI: Soft, Nondistended. Incisions: Clean and dry. Urine: Clear  Lab Results: Recent Labs    04/12/19 1618 04/13/19 1753 04/14/19 0439  HGB 9.5* 9.0* 8.8*  HCT 29.5* 28.6* 28.1*          Recent Labs    04/09/19 0453 04/12/19 1618 04/14/19 0439  CREATININE 1.33* 1.35* 1.41*           Results for orders placed or performed during the hospital encounter of 04/13/19 (from the past 24 hour(s))  Type and screen [place]     Status: None (Preliminary result)   Collection Time: 04/13/19  1:20 PM  Result Value Ref Range   ABO/RH(D) B POS    Antibody Screen NEG    Sample Expiration      04/16/2019,2359 Performed at [facility], [place] [address], [place], [place] [address]    Unit Number [id number]    Blood Component Type RED CELLS,LR    Unit division 00    Status of Unit ALLOCATED    Transfusion Status OK TO TRANSFUSE    Crossmatch Result Compatible    Unit Number [id number]    Blood Component Type RED CELLS,LR    Unit division 00    Status of Unit ALLOCATED    Transfusion Status OK TO TRANSFUSE    Crossmatch Result Compatible    Unit Number [id number]    Blood  Component Type RED CELLS,LR    Unit division 00    Status of Unit ALLOCATED    Transfusion Status OK TO TRANSFUSE    Crossmatch Result Compatible    Unit Number [id number]    Blood Component Type RED CELLS,LR    Unit division 00    Status of Unit ALLOCATED    Transfusion Status OK TO TRANSFUSE    Crossmatch Result Compatible   Prepare RBC     Status: None   Collection Time: 04/13/19  1:29 PM  Result Value Ref Range   Order Confirmation      ORDER PROCESSED BY BLOOD BANK Performed at [place] [address] [place], [place] [address]   Hemoglobin and hematocrit, blood     Status: Abnormal   Collection Time: 04/13/19  5:53 PM  Result Value Ref Range   Hemoglobin 9.0 (L) 13.0 - 17.0 g/dL   HCT 28.6 (L) 39.0 - [id number] %  Basic metabolic panel     Status: Abnormal   Collection Time: 04/14/19  4:39 AM  Result Value Ref Range   Sodium 136 135 - 145 mmol/L   Potassium 4.6 3.5 - 5.1 mmol/L   Chloride 105 98 - 111 mmol/L   CO2 25 22 - 32 mmol/L   Glucose, Bld 111 (H) 70 - 99 mg/dL  BUN 19 8 - 23 mg/dL   Creatinine, Ser 1.41 (H) 0.61 - 1.24 mg/dL   Calcium 8.0 (L) 8.9 - 10.3 mg/dL   GFR calc non Af Amer 46 (L) >60 mL/min   GFR calc Af Amer 53 (L) >60 mL/min   Anion gap 6 5 - 15  Hemoglobin and hematocrit, blood     Status: Abnormal   Collection Time: 04/14/19  4:39 AM  Result Value Ref Range   Hemoglobin 8.8 (L) 13.0 - 17.0 g/dL   HCT 28.1 (L) 39.0 - 52.0 %    Assessment/Plan: POD# 1 s/p robot assisted laparoscopic left nephroureterectomy.  1) Ambulate, Incentive spirometry 2) Advance diet as tolerated 3) Transition to oral pain medication 4) Continue Foley catheter, will continue on discharge.  Likely discharge tomorrow if does well.       LOS: 1 day   Haskel Schroeder 04/14/2019, 9:55 AM

## 2019-04-15 LAB — HEMOGLOBIN AND HEMATOCRIT, BLOOD
HCT: 28.1 % — ABNORMAL LOW (ref 39.0–52.0)
Hemoglobin: 8.6 g/dL — ABNORMAL LOW (ref 13.0–17.0)

## 2019-04-15 LAB — CREATININE, SERUM
Creatinine, Ser: 1.58 mg/dL — ABNORMAL HIGH (ref 0.61–1.24)
GFR calc Af Amer: 47 mL/min — ABNORMAL LOW (ref >60)
GFR calc non Af Amer: 40 mL/min — ABNORMAL LOW (ref >60)

## 2019-04-15 LAB — CREATININE, FLUID (PLEURAL, PERITONEAL, JP DRAINAGE): Creat, Fluid: 1.6 mg/dL

## 2019-04-15 NOTE — Progress Notes (Signed)
2 Days Post-Op Subjective: The patient is doing well.  No nausea or vomiting. Passing gas, tolerating diet. Ambulating. Pain is adequately controlled.  Milky serosang output from drain.  Drain output 240cc in past day.  Objective: Vital signs in last 24 hours: Temp:  [97.5 F (36.4 C)-98.1 F (36.7 C)] 97.9 F (36.6 C) (03/07 0500) Pulse Rate:  [64-70] 70 (03/07 0500) Resp:  [18] 18 (03/06 1703) BP: (90-107)/(50-61) 107/61 (03/07 0500) SpO2:  [95 %-100 %] 95 % (03/07 0500)  Intake/Output from previous day: 03/06 0701 - 03/07 0700 In: 1200 [P.O.:600; I.V.:600] Out: 2440 [Urine:2200; Drains:240] Intake/Output this shift: Total I/O In: -  Out: 95 [Drains:95]  Physical Exam:  General: Alert and oriented. CV: Regular rate Lungs: Normal work of breathing Abd: Soft, Nondistended. Drain with milky pink tinged output.  Incisions: Clean and dry. Urine: Clear  Lab Results: Recent Labs    04/12/19 1618 04/13/19 1753 04/14/19 0439  HGB 9.5* 9.0* 8.8*  HCT 29.5* 28.6* 28.1*          Recent Labs    04/09/19 0453 04/12/19 1618 04/14/19 0439  CREATININE 1.33* 1.35* 1.41*           No results found for this or any previous visit (from the past 24 hour(s)).  Assessment/Plan: POD# 2 s/p robot assisted laparoscopic left nephroureterectomy.  1) Ambulate, Incentive spirometry 2) Advance diet as tolerated 3) Check drain for JP Cr and triglycerides; nutritionist consult for low fat diet. 4) Continue Foley catheter, will continue on discharge.  Possible discharge today or tomorrow pending drain studies.       LOS: 2 days   Haskel Schroeder 04/15/2019, 8:29 AM

## 2019-04-15 NOTE — Progress Notes (Signed)
I had a brief visit w/pt. He said he was doing well. I offered Chaplain support if needed. Marlise Eves, MDiv

## 2019-04-16 LAB — SURGICAL PATHOLOGY

## 2019-04-16 LAB — HEMOGLOBIN AND HEMATOCRIT, BLOOD
HCT: 28.9 % — ABNORMAL LOW (ref 39.0–52.0)
Hemoglobin: 8.9 g/dL — ABNORMAL LOW (ref 13.0–17.0)

## 2019-04-16 LAB — CREATININE, SERUM
Creatinine, Ser: 1.7 mg/dL — ABNORMAL HIGH (ref 0.61–1.24)
GFR calc Af Amer: 43 mL/min — ABNORMAL LOW (ref >60)
GFR calc non Af Amer: 37 mL/min — ABNORMAL LOW (ref >60)

## 2019-04-16 LAB — TRIGLYCERIDES, BODY FLUIDS: Triglycerides, Fluid: 417 mg/dL

## 2019-04-16 NOTE — Care Management Important Message (Signed)
Important Message  Patient Details IM Letter given to Evette Cristal SW Case Manager to present to the Patient Name: Charles Wright MRN: [id number] Date of Birth: [birth year]   Medicare Important Message Given:  Yes     Kerin Salen 04/16/2019, 11:58 AM

## 2019-04-16 NOTE — Discharge Summary (Signed)
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 Briefly, Charles Wright is a 84 y.o. gentleman with recurrent hematoma and likely upper tract cancer.  After discussing management/treatment options, he elected to proceed with surgical treatment.  Hospital Course: Charles Wright was taken to the operating room on 04/13/2019 and underwent a robotic assisted laparoscopic radical left nephroureterectomy. He tolerated this procedure well and without complications. Postoperatively, he was able to be transferred to a regular hospital room following recovery from anesthesia.  He was able to begin ambulating the night of surgery. He remained hemodynamically stable.  He had excellent urine output. Drain fluid was negative for urine leak but positive for chyle leak. He was instructed on drain care and recording output. He was instructed on foley catheter care.  He was transitioned to oral pain medication, tolerated a  liquid diet, and had met all discharge criteria and was able to be discharged home  on POD#3 with foley catheter and drain in place.   Physical Exam:  General: Alert and oriented. CV: Regular rate Lungs: Normal work of breathing Abd: Soft, Nondistended. Drain with milky pink tinged output.  Incisions: Clean and dry. Urine: Clear  Laboratory values:  Recent Labs    04/14/19 0439 04/15/19 0819 04/16/19 0515  HGB 8.8* 8.6* 8.9*  HCT 28.1* 28.1* 28.9*    Disposition: Home  Discharge instruction: He was instructed to be ambulatory but to refrain from heavy lifting, strenuous activity, or driving. He was instructed on urethral catheter care.  Discharge medications:   Allergies as of 04/16/2019      Reactions   Bee Venom Anaphylaxis   Actually Hornets and Wasps.    Losartan Other (See Comments)   Cough   Oxycodone Other (See Comments)   ALTERED MENTAL STATUS GETS MEAN      Medication List    STOP taking these medications   aspirin EC 81 MG tablet   DONA PO   multivitamin tablet   VITAMIN C PO   Vitamin D-3 25 MCG (1000 UT) Caps   ZINC PO     TAKE these medications   allopurinol 300 MG tablet Commonly known as: ZYLOPRIM Take 300 mg by mouth every morning.   cephALEXin 500 MG capsule Commonly known as: KEFLEX Take 1 capsule (500 mg total) by mouth at bedtime for 10 days.   docusate sodium 100 MG capsule Commonly known as: COLACE Take 1 capsule (100 mg total) by mouth 2 (two) times daily.   EPINEPHrine 0.3 mg/0.3 mL Soaj injection Commonly known as: EPI-PEN Inject 0.3 mLs (0.3 mg total) into the muscle once. What changed:   when to take this  reasons to take this   HYDROcodone-acetaminophen 5-325 MG tablet Commonly known as: NORCO/VICODIN Take 1-2 tablets by mouth every 6 (six) hours as needed for moderate pain. What changed:   how much to take  reasons to take this   isosorbide mononitrate 30 MG 24 hr tablet Commonly known as: IMDUR Take 1 tablet (30 mg total) by mouth daily. Schedule appointment before running out   levothyroxine 50 MCG tablet Commonly known as: SYNTHROID Take 50 mcg by mouth daily before breakfast.   metoprolol tartrate 50 MG tablet Commonly known as: LOPRESSOR Take 50 mg by mouth 2 (two) times daily.   nitroGLYCERIN 0.4 MG SL tablet Commonly known as: NITROSTAT Place  1 tablet (0.4 mg total) under the tongue every 5 (five) minutes x 3 doses as needed for chest pain.   pravastatin 40 MG tablet Commonly known as: PRAVACHOL Take 1 tablet (40 mg total) by mouth daily.   Testosterone 1.62 % Gel Apply 2 Pump topically every other day.       Followup: As scheduled.

## 2019-04-16 NOTE — Discharge Instructions (Signed)
1.  Activity:  You are encouraged to ambulate frequently (about every hour during waking hours) to help prevent blood clots from forming in your legs or lungs.  However, you should not engage in any heavy lifting (> 10-15 lbs), strenuous activity, or straining. 2. Diet: You should advance your diet as instructed by your physician.  It will be normal to have some bloating, nausea, and abdominal discomfort intermittently. Follow the low fat diet instructions from the nutritionist. 3. Prescriptions:  You will be provided a prescription for pain medication to take as needed.  If your pain is not severe enough to require the prescription pain medication, you may take extra strength Tylenol instead which will have less side effects.  You should also take a prescribed stool softener to avoid straining with bowel movements as the prescription pain medication may constipate you. 4. Incisions: You may remove your dressing bandages 48 hours after surgery if not removed in the hospital.  You will either have some small staples or special tissue glue at each of the incision sites. Once the bandages are removed (if present), the incisions may stay open to air.  You may start showering (but not soaking or bathing in water) the 2nd day after surgery and the incisions simply need to be patted dry after the shower.  No additional care is needed. 5. What to call us about: You should call the office [phone]) if you develop fever > 101 or develop persistent vomiting. 6. Record the drain output daily as you were instructed prior to discharge. Bring the record of the daily output to your follow up appointment.   You may resume aspirin, advil, aleve, vitamins, and supplements 7 days after surgery.

## 2019-04-17 LAB — BPAM RBC
Blood Product Expiration Date: 202103192359
Blood Product Expiration Date: 202103192359
Blood Product Expiration Date: 202104042359
Blood Product Expiration Date: 202104042359
Unit Type and Rh: 7300
Unit Type and Rh: 7300
Unit Type and Rh: 7300
Unit Type and Rh: 7300

## 2019-04-17 LAB — TYPE AND SCREEN
ABO/RH(D): B POS
Antibody Screen: NEGATIVE
Unit division: 0
Unit division: 0
Unit division: 0
Unit division: 0

## 2019-04-24 ENCOUNTER — Emergency Department (HOSPITAL_COMMUNITY)
Admission: EM | Admit: 2019-04-24 | Discharge: 2019-04-24 | Disposition: A | Discharge status: Home / Self Care | Payer: PPO | Attending: Emergency Medicine | Admitting: Emergency Medicine

## 2019-04-24 ENCOUNTER — Encounter (HOSPITAL_COMMUNITY): Payer: Self-pay | Admitting: Emergency Medicine

## 2019-04-24 ENCOUNTER — Other Ambulatory Visit: Payer: Self-pay

## 2019-04-24 DIAGNOSIS — R1030 Lower abdominal pain, unspecified: Secondary | ICD-10-CM | POA: Diagnosis not present

## 2019-04-24 DIAGNOSIS — C679 Malignant neoplasm of bladder, unspecified: Secondary | ICD-10-CM | POA: Insufficient documentation

## 2019-04-24 DIAGNOSIS — N029 Recurrent and persistent hematuria with unspecified morphologic changes: Secondary | ICD-10-CM | POA: Insufficient documentation

## 2019-04-24 DIAGNOSIS — I2510 Atherosclerotic heart disease of native coronary artery without angina pectoris: Secondary | ICD-10-CM | POA: Diagnosis not present

## 2019-04-24 DIAGNOSIS — R102 Pelvic and perineal pain: Secondary | ICD-10-CM

## 2019-04-24 DIAGNOSIS — R52 Pain, unspecified: Secondary | ICD-10-CM | POA: Diagnosis not present

## 2019-04-24 DIAGNOSIS — C61 Malignant neoplasm of prostate: Secondary | ICD-10-CM | POA: Insufficient documentation

## 2019-04-24 DIAGNOSIS — R7989 Other specified abnormal findings of blood chemistry: Secondary | ICD-10-CM | POA: Diagnosis not present

## 2019-04-24 DIAGNOSIS — Z79899 Other long term (current) drug therapy: Secondary | ICD-10-CM | POA: Diagnosis not present

## 2019-04-24 DIAGNOSIS — I509 Heart failure, unspecified: Secondary | ICD-10-CM | POA: Diagnosis not present

## 2019-04-24 DIAGNOSIS — I110 Hypertensive heart disease with heart failure: Secondary | ICD-10-CM | POA: Diagnosis not present

## 2019-04-24 DIAGNOSIS — I10 Essential (primary) hypertension: Secondary | ICD-10-CM | POA: Diagnosis not present

## 2019-04-24 LAB — BASIC METABOLIC PANEL
Anion gap: 15 (ref 5–15)
BUN: 26 mg/dL — ABNORMAL HIGH (ref 8–23)
CO2: 18 mmol/L — ABNORMAL LOW (ref 22–32)
Calcium: 8.8 mg/dL — ABNORMAL LOW (ref 8.9–10.3)
Chloride: 100 mmol/L (ref 98–111)
Creatinine, Ser: 2.12 mg/dL — ABNORMAL HIGH (ref 0.61–1.24)
GFR calc Af Amer: 33 mL/min — ABNORMAL LOW (ref >60)
GFR calc non Af Amer: 28 mL/min — ABNORMAL LOW (ref >60)
Glucose, Bld: 108 mg/dL — ABNORMAL HIGH (ref 70–99)
Potassium: 4.2 mmol/L (ref 3.5–5.1)
Sodium: 133 mmol/L — ABNORMAL LOW (ref 135–145)

## 2019-04-24 LAB — CBC
HCT: 34.3 % — ABNORMAL LOW (ref 39.0–52.0)
Hemoglobin: 10.9 g/dL — ABNORMAL LOW (ref 13.0–17.0)
MCH: 27.7 pg (ref 26.0–34.0)
MCHC: 31.8 g/dL (ref 30.0–36.0)
MCV: 87.3 fL (ref 80.0–100.0)
Platelets: 476 10*3/uL — ABNORMAL HIGH (ref 150–400)
RBC: 3.93 MIL/uL — ABNORMAL LOW (ref 4.22–5.81)
RDW: 13.6 % (ref 11.5–15.5)
WBC: 17.9 10*3/uL — ABNORMAL HIGH (ref 4.0–10.5)
nRBC: 0 % (ref 0.0–0.2)

## 2019-04-24 LAB — URINALYSIS, ROUTINE W REFLEX MICROSCOPIC
Bilirubin Urine: NEGATIVE
Glucose, UA: NEGATIVE mg/dL
Ketones, ur: NEGATIVE mg/dL
Nitrite: NEGATIVE
Protein, ur: 100 mg/dL — AB
Specific Gravity, Urine: 1.004 — ABNORMAL LOW (ref 1.005–1.030)
WBC, UA: >50 WBC/hpf — ABNORMAL HIGH (ref 0–5)
pH: 8 (ref 5.0–8.0)

## 2019-04-24 MED ORDER — BELLADONNA ALKALOIDS-OPIUM 16.2-60 MG RE SUPP
1.0000 | Freq: Once | RECTAL | Status: AC
  Administered 2019-04-24: 1 via RECTAL
  Filled 2019-04-24: qty 1

## 2019-04-24 MED ORDER — SODIUM CHLORIDE 0.9 % IV SOLN
1.0000 g | Freq: Once | INTRAVENOUS | Status: AC
  Administered 2019-04-24: 1 g via INTRAVENOUS
  Filled 2019-04-24: qty 10

## 2019-04-24 MED ORDER — ONDANSETRON HCL 4 MG/2ML IJ SOLN
4.0000 mg | Freq: Once | INTRAMUSCULAR | Status: AC
  Administered 2019-04-24: 4 mg via INTRAVENOUS
  Filled 2019-04-24: qty 2

## 2019-04-24 MED ORDER — HYDROCODONE-ACETAMINOPHEN 5-325 MG PO TABS
1.0000 | ORAL_TABLET | Freq: Once | ORAL | Status: AC
  Administered 2019-04-24: 1 via ORAL
  Filled 2019-04-24: qty 1

## 2019-04-24 MED ORDER — OXYBUTYNIN CHLORIDE 5 MG PO TABS: 5.0000 mg | ORAL_TABLET | Freq: Two times a day (BID) | ORAL | 0 refills | Status: DC | PRN

## 2019-04-24 MED ORDER — HYDROMORPHONE HCL 1 MG/ML IJ SOLN
0.5000 mg | Freq: Once | INTRAMUSCULAR | Status: AC
  Administered 2019-04-24: 10:00:00 0.5 mg via INTRAVENOUS
  Filled 2019-04-24: qty 1

## 2019-04-24 MED ORDER — HYDROCODONE-ACETAMINOPHEN 7.5-325 MG PO TABS: 1.0000 | ORAL_TABLET | Freq: Four times a day (QID) | ORAL | 0 refills | Status: DC | PRN

## 2019-04-24 MED ORDER — SODIUM CHLORIDE 0.9 % IV BOLUS
500.0000 mL | Freq: Once | INTRAVENOUS | Status: AC
  Administered 2019-04-24: 10:00:00 500 mL via INTRAVENOUS

## 2019-04-24 MED ORDER — CEPHALEXIN 500 MG PO CAPS: 500.0000 mg | ORAL_CAPSULE | Freq: Two times a day (BID) | ORAL | 0 refills | Status: DC

## 2019-04-24 NOTE — Discharge Instructions (Signed)
Please read and follow all provided instructions.  Your diagnoses today include:  1. Suprapubic pain   2. Elevated serum creatinine     Tests performed today include:  Urine test - shows red and white blood cells in the urine  Blood counts and electrolytes - your kidney function continues to get worse, this will need to be rechecked by your doctor or urologist in the next week  Vital signs. See below for your results today.   Medications prescribed:   Vicodin (hydrocodone/acetaminophen) - narcotic pain medication  DO NOT drive or perform any activities that require you to be awake and alert because this medicine can make you drowsy. BE VERY CAREFUL not to take multiple medicines containing Tylenol (also called acetaminophen). Doing so can lead to an overdose which can damage your liver and cause liver failure and possibly death.  Use pain medication only under direct supervision at the lowest possible dose needed to control your pain.    Oxybutynin -medication for bladder spasms   Keflex (cephalexin) - antibiotic  You have been prescribed an antibiotic medicine: take the entire course of medicine even if you are feeling better. Stopping early can cause the antibiotic not to work.  Take any prescribed medications only as directed.  Home care instructions:  Follow any educational materials contained in this packet.  Follow-up instructions: I spoke with Dr. Gloriann Loan of [facility] today.  Please use prescribed pain medication as well as oxybutynin for control of your pain and spasms.  Please call the office if you have any worsening symptoms.  You should follow-up as planned.  It will be important for you to have your kidney function rechecked as it continues to trend any worsening direction.  You have been prescribed additional antibiotics until urine culture is completed.  Return instructions:   Please return to the Emergency Department if you experience worsening  symptoms.   Please return if you have any other emergent concerns.  Additional Information:  Your vital signs today were: BP 115/85   Pulse 73   Temp 98 F (36.7 C) (Oral)   Resp (!) 22   SpO2 100%  If your blood pressure (BP) was elevated above 135/85 this visit, please have this repeated by your doctor within one month. --------------

## 2019-04-24 NOTE — ED Triage Notes (Addendum)
Pt reports he had nephrourectomy on 3/8 (has JP drain still remaining from procedure) states they placed a foley catheter and is supposed to have it out next Monday. He began having pink tinged urine and pain in his penis earlier this evening, per chart hx of hematuria from L kidney since procedure. Denies fevers or chills. States he spoke to urologist yesterday who advised him to start his pain meds back. Denies abdominal or flank pain.

## 2019-04-24 NOTE — ED Provider Notes (Signed)
[facility] EMERGENCY DEPARTMENT Provider Note   CSN: [id number] Arrival date & time: 04/24/19  0418     History Chief Complaint  Patient presents with  . Post-op Problem  . Hematuria    Charles Wright is a 84 y.o. male.  Patient with history of bladder cancer, right pelvis neoplasm with refractory hematuria, underwent laparoscopic nephro ureterectomy on the left and lymph node dissection on 04/13/2019, current JP drain and Foley catheter --presents to the emergency department today with complaint of lower abdominal and suprapubic pain and spasms as well as penile pain he relates the Foley catheter.  States that he spoke with his urologist yesterday and was told to go back on hydrocodone.  He has been taking this without relief.  States that he has been taking oxybutynin as well without improvement.  Patient denies any fevers, chills, chest pain or shortness of breath.  He states that his urine has been clearing recently and has noted clearing drainage from the JP drain as well. Baseline creatinine 1.3 has been trending upward recently.         Past Medical History:  Diagnosis Date  . Acute gout of left ankle    06-14-2015  . Bladder cancer (HCC)    BCG's tx's  . BPH (benign prostatic hypertrophy)   . CAD (coronary artery disease) CARDIOLOGIST-  DR TILLEY   a. NSTEMI 12/2014 -  99% dLAD-2 (not amenable to PCI), 50% dLAD-1, 60% mLAD. Medical therapy was recommended.  . Cough    HARSH NONPRODUCTIVE COUGH 1 AND 1/2 WEEKS  . GERD (gastroesophageal reflux disease)    takes  OTC periodically  . Gilbert's syndrome   . H/O cardiac catheterization    a. Note: Difficult radial access in 12/2014 - recommend femoral approach if cath needed in the future.  Marland Kitchen History of kidney stones   . History of non-ST elevation myocardial infarction (NSTEMI)    12-12-2014   CARDIAC CATH W/ NO INTERVENTION X 2FEW DAYS APART  . History of spinal fracture 2002   LUMBAR  . Hyperlipidemia    . Hypertension   . Hypothyroidism   . Myocardial infarction ([place])   . OSA on CPAP    SET ON 7 USES SOME NIGHTS  . Paroxysmal A-fib [facility])    cardiologist-  dr Wynonia Lawman  . Prostate cancer ([place]) 2019   last PSA  2.9  (montiored by urologist  dr Junious Silk) [place] FEB 2019 RAD DONE  . Shingles    2 weeks ago  . Wears glasses     Patient Active Problem List   Diagnosis Date Noted  . Renal mass 04/13/2019  . Gross hematuria 04/08/2019  . Hyperlipidemia 12/14/2014  . CAD (coronary artery disease), native coronary artery   . Paroxysmal atrial fibrillation ([place]) 08/13/2014  . Long-term (current) use of anticoagulants 08/13/2014  . Obesity (BMI 30-39.9) 08/13/2014  . Hypertensive heart disease   . Sleep apnea   . Bladder cancer ([place])   . Prostate cancer ([place])   . Lumbar stenosis with neurogenic claudication 12/03/2013  . Upper airway cough syndrome 01/27/2013    Past Surgical History:  Procedure Laterality Date  . BACK SURGERY  2014   LOWER l 1, 2 4 AND 5  . BILATERAL INGUINAL HERNIA REPAIR    . CARDIAC CATHETERIZATION N/A 12/13/2014   Procedure: Left Heart Cath and Coronary Angiography;  Surgeon: Leonie Man, MD;  Location: [facility];  Service: Cardiovascular;  Laterality: N/A;  culprit lesion diat LAD-2  99%, not approachable via PCTA  due to extremely tortuous up stream LAD/  mLAD 60% and dLAD-1 50%, both are at the extremely tortuous segment and not PCI targets/  otherwise normal coronary arteries  . COLONOSCOPY WITH PROPOFOL N/A 01/16/2019   Procedure: COLONOSCOPY WITH PROPOFOL;  Surgeon: Clarene Essex, MD;  Location: [facility];  Service: Endoscopy;  Laterality: N/A;  . CYSTOSCOPY W/ RETROGRADES Left 08/22/2012   Procedure: CYSTOSCOPY WITH RETROGRADE PYELOGRAM;  left kidney washings;  Surgeon: Fredricka Bonine, MD;  Location: [facility];  Service: Urology;  Laterality: Left;  . CYSTOSCOPY W/ RETROGRADES N/A 07/08/2015   Procedure: CYSTOSCOPY WITH  RETROGRADE PYELOGRAM;  Surgeon: [name], MD;  Location: [facility];  Service: Urology;  Laterality: N/A;  . CYSTOSCOPY W/ URETERAL STENT PLACEMENT Left 03/24/2019   Procedure: CYSTOSCOPY WITH RETROGRADE PYELOGRAM/URETERAL STENT PLACEMENT ureteroscopy biopsy of neoplasm;  Surgeon: [name], MD;  Location: [facility];  Service: Urology;  Laterality: Left;  . CYSTOSCOPY W/ URETERAL STENT PLACEMENT Left 04/08/2019   Procedure: CYSTOSCOPY cystogram clot evacuation left ureteroscopy clot evacuation left stent exchange liimited transuretheral resectio of prastate and fulguration  bleeders of prostate;  Surgeon: [name], MD;  Location: [facility];  Service: Urology;  Laterality: Left;  . CYSTOSCOPY WITH BIOPSY  08/22/2012   Procedure: CYSTOSCOPY WITH BIOPSY;  Surgeon: Fredricka Bonine, MD;  Location: [facility];  Service: Urology;;  . [name] WITH BIOPSY N/A 11/08/2014   Procedure: CYSTOSCOPY/BIOPSPY BLADDER INSTILLATION OF MARCAINE AND PYRIDIUM;  Surgeon: [name], MD;  Location: [facility];  Service: Urology;  Laterality: N/A;  . CYSTOSCOPY WITH BIOPSY N/A 07/08/2015   Procedure: CYSTOSCOPY WITH BLADDER BIOPSY, FULGURATION, RETROGRADE PYLOGRAM AND RENAL WASHING;  Surgeon: [name], MD;  Location: [facility];  Service: Urology;  Laterality: N/A;  . CYSTOSCOPY WITH RETROGRADE PYELOGRAM, URETEROSCOPY AND STENT PLACEMENT Bilateral 07/30/2014   Procedure: CYSTOSCOPY WITH BLADDER BX FULERGATION AND BILATERAL RETROGRADE PYELOGRAM,;  Surgeon: [name], MD;  [facility];  Service: Urology;  Laterality: Bilateral;  . CYSTOSCOPY WITH STENT PLACEMENT Left 03/24/2018   Procedure: STENT PLACEMENT AND BIOPSY;  Surgeon: [name], MD;  [facility];  Service: Urology;  Laterality: Left;  . CYSTOSCOPY/RETROGRADE/URETEROSCOPY Bilateral 08/17/2013   Procedure: CYSTOSCOPY, BLADDER  BIOPSY WITH BILATERAL RETROGRADE PYELOGRAM, LEFT URETEROSCOPY AND DILATION OF STRICTURE ;  Surgeon: [name], MD;  Location: [facility];  Service: Urology;  Laterality: Bilateral;  . CYSTOSCOPY/RETROGRADE/URETEROSCOPY Left 03/24/2018   Procedure: CYSTOSCOPY/RETROGRADE/URETEROSCOPY;  Surgeon: [name], MD;  Location: [facility];  Service: Urology;  Laterality: Left;  . HOT HEMOSTASIS N/A 01/16/2019   Procedure: HOT HEMOSTASIS (ARGON PLASMA COAGULATION/BICAP);  Surgeon: Clarene Essex, MD;  Location: [facility];  Service: Endoscopy;  Laterality: N/A;  . LEFT ATRIAL APPENDAGE OCCLUSION N/A 01/09/2015   Procedure: LEFT ATRIAL APPENDAGE OCCLUSION;  Surgeon: [name], MD;  Location: [facility];  Service: Cardiovascular;  Laterality: N/A;  . LUMBAR LAMINECTOMY/DECOMPRESSION MICRODISCECTOMY Left 12/03/2013   Procedure: Left Lumbar three-four diskectomy with Lumbar four laminectomy ;  Surgeon: Newman Pies, MD;  Location: [facility];  Service: Neurosurgery;  Laterality: Left;  Left Lumbar three-four diskectomy with Lumbar four laminectomy   . LYMPH NODE DISSECTION Bilateral 04/13/2019   Procedure: LYMPH NODE DISSECTION;  Surgeon: Alexis Frock, MD;  Location[facility];  Service: Urology;  Laterality: Bilateral;  . ORIF LEFT ANKLE FX  2005  .  POLYPECTOMY  01/16/2019   Procedure: POLYPECTOMY;  Surgeon: Clarene Essex, MD;  Location: [facility];  Service: Endoscopy;;  . PROSTATE BIOPSY N/A 08/22/2012   Procedure: BIOPSY TRANSRECTAL ULTRASONIC PROSTATE (TUBP);  Surgeon: Fredricka Bonine, MD;  Location: [facility];  Service: Urology;  Laterality: N/A;  . ROBOT ASSITED LAPAROSCOPIC NEPHROURETERECTOMY Left 04/13/2019   Procedure: XI ROBOT ASSITED LAPAROSCOPIC NEPHROURETERECTOMY;  Surgeon: Alexis Frock, MD;  Location: [facility];  Service: Urology;  Laterality: Left;  3 HRS  . TEE WITHOUT CARDIOVERSION N/A 12/31/2014   Procedure:  TRANSESOPHAGEAL ECHOCARDIOGRAM (TEE);  Surgeon: Pixie Casino, MD;  Location: [facility];  Service: Cardiovascular;  Laterality: N/A;  ef 55-60%/  mild MR, TR, and PR/  mild LAE and RAE  . TRANSURETHRAL RESECTION OF BLADDER TUMOR  10/22/2011   Procedure: TRANSURETHRAL RESECTION OF BLADDER TUMOR (TURBT);  Surgeon: Fredricka Bonine, MD;  [facility];  Service: Urology;  Laterality: N/A;  TURBT, LEFT URETEROSCOPY, POSSIBLE URETERAL STENT   . URETEROSCOPY  10/22/2011   Procedure: URETEROSCOPY;  Surgeon: Fredricka Bonine, MD;  Location: [facility];  Service: Urology;  Laterality: Left;  . WISDOM TOOTH EXTRACTION         Family History  Problem Relation Age of Onset  . Leukemia Mother   . Cancer Mother        leukemia  . Heart attack Father   . Cancer Sister        breast  . Hypertension Neg Hx   . Stroke Neg Hx     Social History   Tobacco Use  . Smoking status: Never Smoker  . Smokeless tobacco: Never Used  Substance Use Topics  . Alcohol use: Yes    Alcohol/week: 7.0 standard drinks    Types: 7 Glasses of wine per week    Comment: red wine glass daily  . Drug use: No    Home Medications Prior to Admission medications   Medication Sig Start Date End Date Taking? Authorizing Provider  EPINEPHrine 0.3 mg/0.3 mL IJ SOAJ injection Inject 0.3 mLs (0.3 mg total) into the muscle once. Patient taking differently: Inject 0.3 mg into the muscle daily as needed for anaphylaxis.  08/18/14  Yes Piepenbrink, Anderson Malta, PA-C  oxybutynin (DITROPAN) 5 MG tablet Take 5 mg by mouth every 12 (twelve) hours as needed. 04/20/19  Yes [provider]  pravastatin (PRAVACHOL) 40 MG tablet Take 1 tablet (40 mg total) by mouth daily. 12/14/14  Yes Jacolyn Reedy, MD  allopurinol (ZYLOPRIM) 300 MG tablet Take 300 mg by mouth every morning.     [provider]  docusate sodium (COLACE) 100 MG capsule Take 1 capsule (100 mg total) by  mouth 2 (two) times daily. 04/09/19   [name], MD  HYDROcodone-acetaminophen (NORCO) 7.5-325 MG tablet Take 1 tablet by mouth every 4 (four) hours as needed for pain. 04/20/19   [provider]  HYDROcodone-acetaminophen (NORCO/VICODIN) 5-325 MG tablet Take 1-2 tablets by mouth every 6 (six) hours as needed for moderate pain. 04/13/19   Debbrah Alar, PA-C  isosorbide mononitrate (IMDUR) 30 MG 24 hr tablet Take 1 tablet (30 mg total) by mouth daily. Schedule appointment before running out 02/27/18   Park Liter, MD  levothyroxine (SYNTHROID, LEVOTHROID) 50 MCG tablet Take 50 mcg by mouth daily before breakfast.    [provider]  metoprolol (LOPRESSOR) 50 MG tablet Take 50 mg by mouth 2 (two) times daily.    [provider]  nitroGLYCERIN (NITROSTAT) 0.4 MG SL tablet Place 1 tablet (0.4 mg total) under the tongue every 5 (five) minutes x 3 doses as needed for chest pain. 12/14/14   Jacolyn Reedy, MD  Testosterone 1.62 % GEL Apply 2 Pump topically every other day.     [provider]    Allergies    Bee venom, Losartan, and Oxycodone  Review of Systems   Review of Systems  Constitutional: Negative for fever.  HENT: Negative for rhinorrhea and sore throat.   Eyes: Negative for redness.  Respiratory: Negative for cough.   Cardiovascular: Negative for chest pain.  Gastrointestinal: Negative for abdominal pain, diarrhea, nausea and vomiting.  Genitourinary: Positive for hematuria and penile pain. Negative for flank pain.  Musculoskeletal: Negative for myalgias.  Skin: Negative for rash.  Neurological: Negative for headaches.    Physical Exam Updated Vital Signs BP (!) 118/59   Pulse 73   Temp 98 F (36.7 C) (Oral)   Resp (!) 22   SpO2 100%   Physical Exam Vitals and nursing note reviewed.  Constitutional:      Appearance: He is well-developed.  HENT:     Head: Normocephalic and atraumatic.  Eyes:     General:        Right eye:  No discharge.        Left eye: No discharge.     Conjunctiva/sclera: Conjunctivae normal.  Cardiovascular:     Rate and Rhythm: Normal rate and regular rhythm.     Heart sounds: Normal heart sounds.  Pulmonary:     Effort: Pulmonary effort is normal.     Breath sounds: Normal breath sounds.  Abdominal:     Palpations: Abdomen is soft.     Tenderness: There is abdominal tenderness. There is no guarding or rebound.     Comments: JP drain in place with pink-colored drainage in the bulb.  Entrance in the left lower abdomen is clean without surrounding erythema or drainage.  Moderate suprapubic tenderness.  Genitourinary:    Comments: Foley catheter in place.  Urethral meatus appears normal.  No erythema, swelling of the penile shaft.  There is pink blood-tinged cloudy urine in the Foley tubing and bag. Musculoskeletal:     Cervical back: Normal range of motion and neck supple.  Skin:    General: Skin is warm and dry.  Neurological:     Mental Status: He is alert.     ED Results / Procedures / Treatments   Labs (all labs ordered are listed, but only abnormal results are displayed) Labs Reviewed  URINALYSIS, ROUTINE W REFLEX MICROSCOPIC - Abnormal; Notable for the following components:      Result Value   APPearance HAZY (*)    Specific Gravity, Urine 1.004 (*)    Hgb urine dipstick LARGE (*)    Protein, ur 100 (*)    Leukocytes,Ua LARGE (*)    WBC, UA >50 (*)    Bacteria, UA FEW (*)    All other components within normal limits  BASIC METABOLIC PANEL - Abnormal; Notable for the following components:   Sodium 133 (*)    CO2 18 (*)    Glucose, Bld 108 (*)    BUN 26 (*)    Creatinine, Ser 2.12 (*)    Calcium 8.8 (*)    GFR calc non Af Amer 28 (*)    GFR calc Af Amer 33 (*)    All other components within normal limits  CBC - Abnormal; Notable  for the following components:   WBC 17.9 (*)    RBC 3.93 (*)    Hemoglobin 10.9 (*)    HCT 34.3 (*)    Platelets 476 (*)    All  other components within normal limits  URINE CULTURE    EKG None  Radiology No results found.  Procedures Procedures (including critical care time)  Medications Ordered in ED Medications  opium-belladonna (B&O SUPPRETTES) 16.2-60 MG suppository 1 suppository (has no administration in time range)  HYDROmorphone (DILAUDID) injection 0.5 mg (0.5 mg Intravenous Given 04/24/19 0941)  ondansetron (ZOFRAN) injection 4 mg (4 mg Intravenous Given 04/24/19 0941)  sodium chloride 0.9 % bolus 500 mL (500 mLs Intravenous New Bag/Given 04/24/19 0940)  cefTRIAXone (ROCEPHIN) 1 g in sodium chloride 0.9 % 100 mL IVPB (0 g Intravenous Stopped 04/24/19 1013)    ED Course  I have reviewed the triage vital signs and the nursing notes.  Pertinent labs & imaging results that were available during my care of the patient were reviewed by me and considered in my medical decision making (see chart for details).  Patient seen and examined.  Reviewed history.  Reviewed lab work-up to this point.  Discussed with Dr. Vanita Panda.  Will discuss with urology.  Pain medication ordered.  Vital signs reviewed and are as follows: BP (!) 118/59   Pulse 73   Temp 98 F (36.7 C) (Oral)   Resp (!) 22   SpO2 100%   8:21 AM I spoke with Dr. Gloriann Loan of urology.  We discussed exam and labs.  He recommends IV fluids, Rocephin, home on Keflex.  Agrees with urine culture.  States that we can try B&O suppository for bladder spasms.  Would continue on oxybutynin 5 mg.  If patient appears well, can go home and follow-up as planned.  9:18 AM Patient rechecked. Seen earlier by Dr. Vanita Panda. Patient has not yet received any therapeutics ordered earlier.   11:05 AM patient received pain medication and has been able to rest for a while.  Overall he feels better.  Plan is for discharge home.  We discussed discussion with urology as well as his lab work-up today.  Discussed importance of having his kidney function rechecked.  He will be given  prescription for Vicodin, oxybutynin, Keflex.  Patient should be completing once a day Keflex however this would be increased to twice a day for the next 7 days or until otherwise instructed by urologist.  Patient encouraged to call urology office if his symptoms get worse.  Use pain medication only under direct supervision at the lowest possible dose needed to control your pain.      MDM Rules/Calculators/A&P                      Patient presents with suprapubic pain and bladder spasms as well as penile pain after recent urological procedure.  Patient has continued hematuria.  UA is difficult to interpret given recent surgery.  Culture sent.  Patient does have elevated white blood cell count.  No fevers.  He is nontoxic in appearance and I do not suspect sepsis today.  Discussed plan with urology.  Of note, patient has increasing creatinine.  He was given some IV fluids in the emergency department for this today.  Urology aware and will need to closely monitor.  Patient cleared for discharge after discussion with urology and I do not see any emergent indications for admission at this point.  Patient in agreement.  Final Clinical Impression(s) / ED Diagnoses Final diagnoses:  Suprapubic pain  Elevated serum creatinine    Rx / DC Orders ED Discharge Orders    None       Carlisle Cater, PA-C 04/24/19 [place], MD 04/27/19 [phone]

## 2019-04-25 ENCOUNTER — Ambulatory Visit: Payer: PPO

## 2019-04-25 ENCOUNTER — Ambulatory Visit: Payer: PPO | Attending: Internal Medicine

## 2019-04-25 DIAGNOSIS — E039 Hypothyroidism, unspecified: Secondary | ICD-10-CM | POA: Diagnosis not present

## 2019-04-25 DIAGNOSIS — D6869 Other thrombophilia: Secondary | ICD-10-CM | POA: Diagnosis not present

## 2019-04-25 DIAGNOSIS — I10 Essential (primary) hypertension: Secondary | ICD-10-CM | POA: Diagnosis not present

## 2019-04-25 DIAGNOSIS — I480 Paroxysmal atrial fibrillation: Secondary | ICD-10-CM | POA: Diagnosis not present

## 2019-04-25 DIAGNOSIS — N2889 Other specified disorders of kidney and ureter: Secondary | ICD-10-CM | POA: Diagnosis not present

## 2019-04-25 DIAGNOSIS — I25119 Atherosclerotic heart disease of native coronary artery with unspecified angina pectoris: Secondary | ICD-10-CM | POA: Diagnosis not present

## 2019-04-25 DIAGNOSIS — Z23 Encounter for immunization: Secondary | ICD-10-CM

## 2019-04-25 DIAGNOSIS — I700 Atherosclerosis of aorta: Secondary | ICD-10-CM | POA: Diagnosis not present

## 2019-04-25 DIAGNOSIS — Z8551 Personal history of malignant neoplasm of bladder: Secondary | ICD-10-CM | POA: Diagnosis not present

## 2019-04-25 DIAGNOSIS — Z0000 Encounter for general adult medical examination without abnormal findings: Secondary | ICD-10-CM | POA: Diagnosis not present

## 2019-04-25 DIAGNOSIS — M109 Gout, unspecified: Secondary | ICD-10-CM | POA: Diagnosis not present

## 2019-04-25 DIAGNOSIS — Z1389 Encounter for screening for other disorder: Secondary | ICD-10-CM | POA: Diagnosis not present

## 2019-04-25 DIAGNOSIS — E7800 Pure hypercholesterolemia, unspecified: Secondary | ICD-10-CM | POA: Diagnosis not present

## 2019-04-25 NOTE — Progress Notes (Signed)
   Covid-19 Vaccination Clinic  Name:  Charles Wright    MRN: [id number] DOB: 1935-12-27  04/25/2019  [name] was observed post Covid-19 immunization for 15 minutes without incident. He was provided with Vaccine Information Sheet and instruction to access the V-Safe system.   [name] was instructed to call 911 with any severe reactions post vaccine: Marland Kitchen Difficulty breathing  . Swelling of face and throat  . A fast heartbeat  . A bad rash all over body  . Dizziness and weakness   Immunizations Administered    Name Date Dose VIS Date Route   Pfizer COVID-19 Vaccine 04/25/2019  1:48 PM 0.3 mL 01/19/2019 Intramuscular   Manufacturer: [place]   Lot: [id number]   [place]: [id number]

## 2019-04-25 NOTE — Progress Notes (Signed)
   Covid-19 Vaccination Clinic  Name:  Charles Wright    MRN: [id number] DOB: [birth year]  04/25/2019  Charles Wright was observed post Covid-19 immunization for 15 minutes without incident. He was provided with Vaccine Information Sheet and instruction to access the V-Safe system.   Charles Wright was instructed to call 911 with any severe reactions post vaccine: Marland Kitchen Difficulty breathing  . Swelling of face and throat  . A fast heartbeat  . A bad rash all over body  . Dizziness and weakness   Immunizations Administered    Name Date Dose VIS Date Route   Pfizer COVID-19 Vaccine 04/25/2019  1:48 PM 0.3 mL 01/19/2019 Intramuscular   Manufacturer: [place]   Lot: [id number]   [place]: [id number]

## 2019-04-27 LAB — URINE CULTURE: Culture: >=100000 — AB

## 2019-04-28 NOTE — Progress Notes (Signed)
ED Antimicrobial Stewardship Positive Culture Follow Up   Charles Wright is an 84 y.o. male who presented to [facility] on 04/24/2019 with a chief complaint of  Chief Complaint  Patient presents with  . Post-op Problem  . Hematuria    Recent Results (from the past 720 hour(s))  Respiratory Panel by RT PCR (Flu A&B, Covid) - Nasopharyngeal Swab     Status: None   Collection Time: 04/08/19  5:25 AM   Specimen: Nasopharyngeal Swab  Result Value Ref Range Status   SARS Coronavirus 2 by RT PCR NEGATIVE NEGATIVE Final    Comment: (NOTE) SARS-CoV-2 target nucleic acids are NOT DETECTED. The SARS-CoV-2 RNA is generally detectable in upper respiratoy specimens during the acute phase of infection. The lowest concentration of SARS-CoV-2 viral copies this assay can detect is 131 copies/mL. A negative result does not preclude SARS-Cov-2 infection and should not be used as the sole basis for treatment or other patient management decisions. A negative result may occur with  improper specimen collection/handling, submission of specimen other than nasopharyngeal swab, presence of viral mutation(s) within the areas targeted by this assay, and inadequate number of viral copies (<131 copies/mL). A negative result must be combined with clinical observations, patient history, and epidemiological information. The expected result is Negative. Fact Sheet for Patients:  PinkCheek.be Fact Sheet for Healthcare Providers:  GravelBags.it This test is not yet ap proved or cleared by the [place] FDA and  has been authorized for detection and/or diagnosis of SARS-CoV-2 by FDA under an Emergency Use Authorization (EUA). This EUA will remain  in effect (meaning this test can be used) for the duration of the COVID-19 declaration under Section 564(b)(1) of the Act, 21 U.S.C. section 360bbb-3(b)(1), unless the authorization is terminated or revoked  sooner.    Influenza A by PCR NEGATIVE NEGATIVE Final   Influenza B by PCR NEGATIVE NEGATIVE Final    Comment: (NOTE) The Xpert Xpress SARS-CoV-2/FLU/RSV assay is intended as an aid in  the diagnosis of influenza from Nasopharyngeal swab specimens and  should not be used as a sole basis for treatment. Nasal washings and  aspirates are unacceptable for Xpert Xpress SARS-CoV-2/FLU/RSV  testing. Fact Sheet for Patients: PinkCheek.be Fact Sheet for Healthcare Providers: GravelBags.it This test is not yet approved or cleared by the [place] FDA and  has been authorized for detection and/or diagnosis of SARS-CoV-2 by  FDA under an Emergency Use Authorization (EUA). This EUA will remain  in effect (meaning this test can be used) for the duration of the  Covid-19 declaration under Section 564(b)(1) of the Act, 21  U.S.C. section 360bbb-3(b)(1), unless the authorization is  terminated or revoked. Performed at [facility], [place] [address] [place], [place] [address]   Surgical pcr screen     Status: None   Collection Time: 04/08/19 11:13 AM   Specimen: Nasal Mucosa; Nasal Swab  Result Value Ref Range Status   MRSA, PCR NEGATIVE NEGATIVE Final   Staphylococcus aureus NEGATIVE NEGATIVE Final    Comment: (NOTE) The Xpert SA Assay (FDA approved for NASAL specimens in patients 48 years of age and older), is one component of a comprehensive surveillance program. It is not intended to diagnose infection nor to guide or monitor treatment. Performed at [facility], [place] [address], [place], [place] [address]   SARS CORONAVIRUS 2 (TAT 6-24 HRS) Nasopharyngeal Nasopharyngeal Swab     Status: None   Collection Time: 04/10/19  1:18 PM  Specimen: Nasopharyngeal Swab  Result Value Ref Range Status   SARS Coronavirus 2 NEGATIVE NEGATIVE Final    Comment: (NOTE) SARS-CoV-2 target nucleic acids are  NOT DETECTED. The SARS-CoV-2 RNA is generally detectable in upper and lower respiratory specimens during the acute phase of infection. Negative results do not preclude SARS-CoV-2 infection, do not rule out co-infections with other pathogens, and should not be used as the sole basis for treatment or other patient management decisions. Negative results must be combined with clinical observations, patient history, and epidemiological information. The expected result is Negative. Fact Sheet for Patients: SugarRoll.be Fact Sheet for Healthcare Providers: https://www.woods-mathews.com/ This test is not yet approved or cleared by the [place] FDA and  has been authorized for detection and/or diagnosis of SARS-CoV-2 by FDA under an Emergency Use Authorization (EUA). This EUA will remain  in effect (meaning this test can be used) for the duration of the COVID-19 declaration under Section 56 4(b)(1) of the Act, 21 U.S.C. section 360bbb-3(b)(1), unless the authorization is terminated or revoked sooner. Performed at [facility], [place] [address] [place], [place] [address]   Urine C&S     Status: Abnormal   Collection Time: 04/24/19 11:24 AM   Specimen: Urine, Random  Result Value Ref Range Status   Specimen Description URINE, RANDOM  Final   Special Requests   Final    NONE Performed at [facility], [place] [address], [place], [place] [address]    Culture >=100,000 COLONIES/mL ESCHERICHIA COLI (A)  Final   Report Status 04/27/2019 FINAL  Final   Organism ID, Bacteria ESCHERICHIA COLI (A)  Final      Susceptibility   Escherichia coli - MIC*    AMPICILLIN >=32 RESISTANT Resistant     CEFAZOLIN >=64 RESISTANT Resistant     CEFTRIAXONE >=64 RESISTANT Resistant     CIPROFLOXACIN >=4 RESISTANT Resistant     GENTAMICIN <=1 SENSITIVE Sensitive     IMIPENEM RESISTANT Resistant     NITROFURANTOIN <=16 SENSITIVE Sensitive     TRIMETH/SULFA  <=20 SENSITIVE Sensitive     AMPICILLIN/SULBACTAM >=32 RESISTANT Resistant     PIP/TAZO >=128 RESISTANT Resistant     * >=100,000 COLONIES/mL ESCHERICHIA COLI    [x]  Treated with cephalexin, organism resistant to prescribed antimicrobial []  Patient discharged originally without antimicrobial agent and treatment is now indicated  New antibiotic prescription: DC cephalexin, start bactrim DS 1 tab PO BID x 7 days. Please ensure patient has urology follow-up to monitor labs (especially potassium with worsening renal fxn on bactrim) and to make any further treatment decisions.   ED Provider: Emeterio Reeve, PA-C  [place], Rande Lawman 04/28/2019, 9:39 AM Clinical Pharmacist Phone -  [phone]

## 2019-04-29 ENCOUNTER — Telehealth: Payer: Self-pay | Admitting: Emergency Medicine

## 2019-04-29 NOTE — Telephone Encounter (Signed)
Post ED Visit - Positive Culture Follow-up: Successful Patient Follow-Up  Culture assessed and recommendations reviewed by:  []  Elenor Quinones, Pharm.D. []  Heide Guile, Pharm.D., BCPS AQ-ID []  Parks Neptune, Pharm.D., BCPS []  Alycia Rossetti, Pharm.D., BCPS []  [place], Pharm.D., BCPS, AAHIVP []  Legrand Como, Pharm.D., BCPS, AAHIVP [x]  Salome Arnt, PharmD, BCPS []  Johnnette Gourd, PharmD, BCPS []  Hughes Better, PharmD, BCPS []  Leeroy Cha, PharmD  Positive urine culture  []  Patient discharged without antimicrobial prescription and treatment is now indicated [x]  Organism is resistant to prescribed ED discharge antimicrobial []  Patient with positive blood cultures  Changes discussed with ED provider: Emeterio Reeve PA New antibiotic prescription: Bactrim DS one tab PO BID x seven days Called to [facility] Renie Ora [phone])  Contacted patient, date 04/29/2019,  time 1310 Patient reports he has scheduled follow-up with urologist tomorrow.  Milus Mallick, RN 04/29/2019, 1:26 PM

## 2019-05-02 ENCOUNTER — Encounter: Payer: Self-pay | Admitting: Internal Medicine

## 2019-05-02 ENCOUNTER — Other Ambulatory Visit: Payer: Self-pay

## 2019-05-02 ENCOUNTER — Ambulatory Visit: Payer: PPO | Admitting: Internal Medicine

## 2019-05-02 VITALS — BP 90/60 | HR 55 | Temp 96.6°F | Ht 67.0 in | Wt 198.8 lb

## 2019-05-02 DIAGNOSIS — I2510 Atherosclerotic heart disease of native coronary artery without angina pectoris: Secondary | ICD-10-CM

## 2019-05-02 DIAGNOSIS — I951 Orthostatic hypotension: Secondary | ICD-10-CM | POA: Diagnosis not present

## 2019-05-02 DIAGNOSIS — I480 Paroxysmal atrial fibrillation: Secondary | ICD-10-CM | POA: Diagnosis not present

## 2019-05-02 MED ORDER — METOPROLOL TARTRATE 25 MG PO TABS: 25.0000 mg | ORAL_TABLET | Freq: Two times a day (BID) | ORAL | 3 refills | Status: DC

## 2019-05-02 NOTE — Patient Instructions (Signed)
Medication Instructions:  Your physician has recommended you make the following change in your medication:  - STOP isosorbide mononitrate - DECREASE metoprolol tartrate to 25mg  twice daily - CONTINUE other current medications  *If you need a refill on your cardiac medications before your next appointment, please call your pharmacy*   Follow-Up: At [facility], you and your health needs are our priority.  As part of our continuing mission to provide you with exceptional heart care, we have created designated Provider Care Teams.  These Care Teams include your primary Cardiologist (physician) and Advanced Practice Providers (APPs -  Physician Assistants and Nurse Practitioners) who all work together to provide you with the care you need, when you need it.  We recommend signing up for the patient portal called "MyChart".  Sign up information is provided on this After Visit Summary.  MyChart is used to connect with patients for Virtual Visits (Telemedicine).  Patients are able to view lab/test results, encounter notes, upcoming appointments, etc.  Non-urgent messages can be sent to your provider as well.   To learn more about what you can do with MyChart, go to [email or web address].    Your next appointment:   1 month(s)  The format for your next appointment:   Virtual Visit   Provider:   K. Mali Hilty, MD   Other Instructions  Dr. Debara Pickett advises using your incentive spirometer

## 2019-05-02 NOTE — Progress Notes (Signed)
OFFICE NOTE  Chief Complaint:  Lightheaded  Primary Care Physician: Maury Dus, MD  HPI:  Charles Wright is a 84 y.o. male with a past medial history significant for coronary artery disease with non-STEMI in 2016.  He was found to have distal LAD disease not amenable to PCI as well as moderate mid to proximal LAD disease.  Medical therapy was recommended.  He also has a history of bladder cancer, prostate cancer, hyperlipidemia, hypertension and OSA on CPAP.  He has been followed by Dr. Wynonia Lawman.  He maintains on clopidogrel but not aspirin.  Recently he has been having issues with ureteral stricture and is recommended to have a cystoscopy with Dr. Junious Silk.  He would require clearance including holding clopidogrel prior to that procedure.  Today in the office he is coughing significantly.  He apparently had upper respiratory infection and then also had concomitant rash which was painful and vesicular and diagnosed as shingles by a dermatologist that he is friends with.  Besides his shortness of breath related upper respiratory infection as well as chest wall pain from shingles, he denies any chest pain concerning for angina or recent worsening shortness of breath.  10/10/2018  Charles Wright returns today for follow-up of coronary disease and atrial fibrillation.  He is a former patient of Dr. Wynonia Lawman however Dr. Wynonia Lawman is now retired.  When we last spoke he received preoperative clearance for urologic procedure.  He does have a history of a bladder cancer and hematuria.  Interestingly, based on further chart review, he had had a prior watchman procedure for his A. fib attempt at stroke reduction.  At that time because of bleeding he was not felt to be a candidate for anticoagulation for his A. fib.  He had been on amiodarone.  He was then referred for watchman procedure which shows ultimately unsuccessful.  I was involved in the imaging component of that procedure.  Subsequently it was recommended he go on  aspirin and Plavix since he was not a candidate for anticoagulation due to GU bleeding from bladder cancer.  That has however been successfully treated and has had no further bleeding issues.  He also just had recent ureteral stenting which seems to have been successful.  When reviewing his chart today however it it notes that he is only on clopidogrel monotherapy.  Have an STEMI in 2016 and was found to have significant coronary disease however not amenable to intervention.  He is done well with that without any further chest pain however did have significant fatigue recently.  It turned out this was related to low testosterone and he is now on testosterone replacement.  Likely a side effect of antiandrogen therapy.  Recent labs show total cholesterol 158, HDL 50 LDL, 86 and triglycerides 115.  05/02/2019  Charles Wright returns today for follow-up. He is complaining of some issues with lightheadedness. He recently underwent surgery for what was suspicious for a left upper tract urothelial carcinoma. This was complicated by pain and bladder spasms requiring multiple ER visits. He has had a decreased appetite and weight loss (5 pounds of which was his left kidney/ureter. Additionally, he is down a total of 20 pounds since I last saw him. This is pertinent because his blood pressure today was 90/60. In general it has run higher than this. I suspect it may be a reason why feels lightheaded. His EKG shows a sinus bradycardia at 55. Possible medications that could causes include his isosorbide and metoprolol which she  takes 50 mg twice daily.  PMHx:  Past Medical History:  Diagnosis Date  . Acute gout of left ankle    06-14-2015  . Bladder cancer (HCC)    BCG's tx's  . BPH (benign prostatic hypertrophy)   . CAD (coronary artery disease) CARDIOLOGIST-  DR TILLEY   a. NSTEMI 12/2014 -  99% dLAD-2 (not amenable to PCI), 50% dLAD-1, 60% mLAD. Medical therapy was recommended.  . Cough    HARSH NONPRODUCTIVE COUGH  1 AND 1/2 WEEKS  . GERD (gastroesophageal reflux disease)    takes  OTC periodically  . Gilbert's syndrome   . H/O cardiac catheterization    a. Note: Difficult radial access in 12/2014 - recommend femoral approach if cath needed in the future.  Marland Kitchen History of kidney stones   . History of non-ST elevation myocardial infarction (NSTEMI)    12-12-2014   CARDIAC CATH W/ NO INTERVENTION X 2FEW DAYS APART  . History of spinal fracture 2002   LUMBAR  . Hyperlipidemia   . Hypertension   . Hypothyroidism   . Myocardial infarction ([place])   . OSA on CPAP    SET ON 7 USES SOME NIGHTS  . Paroxysmal A-fib [facility])    cardiologist-  dr Wynonia Lawman  . Prostate cancer ([place]) 2019   last PSA  2.9  (montiored by urologist  dr Junious Silk) [place] FEB 2019 RAD DONE  . Shingles    2 weeks ago  . Wears glasses     Past Surgical History:  Procedure Laterality Date  . BACK SURGERY  2014   LOWER l 1, 2 4 AND 5  . BILATERAL INGUINAL HERNIA REPAIR    . CARDIAC CATHETERIZATION N/A 12/13/2014   Procedure: Left Heart Cath and Coronary Angiography;  Surgeon: Leonie Man, MD;  Location: [place] [facility];  Service: Cardiovascular;  Laterality: N/A;  culprit lesion diat LAD-2  99%, not approachable via PCTA  due to extremely tortuous up stream LAD/  mLAD 60% and dLAD-1 50%, both are at the extremely tortuous segment and not PCI targets/  otherwise normal coronary arteries  . COLONOSCOPY WITH PROPOFOL N/A 01/16/2019   Procedure: COLONOSCOPY WITH PROPOFOL;  Surgeon: Clarene Essex, MD;  [facility]: [facility];  Service: Endoscopy;  Laterality: N/A;  . CYSTOSCOPY W/ RETROGRADES Left 08/22/2012   Procedure: CYSTOSCOPY WITH RETROGRADE PYELOGRAM;  left kidney washings;  Surgeon: Fredricka Bonine, MD;  Location[facility];  Service: Urology;  Laterality: Left;  . CYSTOSCOPY W/ RETROGRADES N/A 07/08/2015   Procedure: CYSTOSCOPY WITH RETROGRADE PYELOGRAM;  Surgeon: [name], MD;  Location: [facility];  Service: Urology;  Laterality: N/A;  . CYSTOSCOPY W/ URETERAL STENT PLACEMENT Left 03/24/2019   Procedure: CYSTOSCOPY WITH RETROGRADE PYELOGRAM/URETERAL STENT PLACEMENT ureteroscopy biopsy of neoplasm;  Surgeon: [name], MD;  [facility];  Service: Urology;  Laterality: Left;  . CYSTOSCOPY W/ URETERAL STENT PLACEMENT Left 04/08/2019   Procedure: CYSTOSCOPY cystogram clot evacuation left ureteroscopy clot evacuation left stent exchange liimited transuretheral resectio of prastate and fulguration  bleeders of prostate;  Surgeon: [name], MD;  [facility];  Service: Urology;  Laterality: Left;  . CYSTOSCOPY WITH BIOPSY  08/22/2012   Procedure: CYSTOSCOPY WITH BIOPSY;  Surgeon: Fredricka Bonine, MD;  Location: [facility];  Service: Urology;;  . [name] WITH BIOPSY N/A 11/08/2014   Procedure: CYSTOSCOPY/BIOPSPY BLADDER INSTILLATION OF MARCAINE AND PYRIDIUM;  Surgeon: [name], MD;  Location: [facility];  Service: Urology;  Laterality: N/A;  . CYSTOSCOPY WITH BIOPSY N/A 07/08/2015   Procedure: CYSTOSCOPY WITH BLADDER BIOPSY, FULGURATION, RETROGRADE PYLOGRAM AND RENAL WASHING;  Surgeon: [name], MD;  Location: [facility];  Service: Urology;  Laterality: N/A;  . CYSTOSCOPY WITH RETROGRADE PYELOGRAM, URETEROSCOPY AND STENT PLACEMENT Bilateral 07/30/2014   Procedure: CYSTOSCOPY WITH BLADDER BX FULERGATION AND BILATERAL RETROGRADE PYELOGRAM,;  Surgeon: [name], MD;  [facility];  Service: Urology;  Laterality: Bilateral;  . CYSTOSCOPY WITH STENT PLACEMENT Left 03/24/2018   Procedure: STENT PLACEMENT AND BIOPSY;  Surgeon: [name], MD;  Location: [facility];  Service: Urology;  Laterality: Left;  . CYSTOSCOPY/RETROGRADE/URETEROSCOPY Bilateral 08/17/2013   Procedure: CYSTOSCOPY, BLADDER BIOPSY WITH BILATERAL RETROGRADE PYELOGRAM, LEFT URETEROSCOPY AND DILATION OF  STRICTURE ;  Surgeon: [name], MD;  Location: [facility];  Service: Urology;  Laterality: Bilateral;  . CYSTOSCOPY/RETROGRADE/URETEROSCOPY Left 03/24/2018   Procedure: CYSTOSCOPY/RETROGRADE/URETEROSCOPY;  Surgeon: [name], MD;  Location: [facility];  Service: Urology;  Laterality: Left;  . HOT HEMOSTASIS N/A 01/16/2019   Procedure: HOT HEMOSTASIS (ARGON PLASMA COAGULATION/BICAP);  Surgeon: Clarene Essex, MD;  Location: [facility];  Service: Endoscopy;  Laterality: N/A;  . LEFT ATRIAL APPENDAGE OCCLUSION N/A 01/09/2015   Procedure: LEFT ATRIAL APPENDAGE OCCLUSION;  Surgeon: [name], MD;  Location: [place] [facility];  Service: Cardiovascular;  Laterality: N/A;  . LUMBAR LAMINECTOMY/DECOMPRESSION MICRODISCECTOMY Left 12/03/2013   Procedure: Left Lumbar three-four diskectomy with Lumbar four laminectomy ;  Surgeon: Newman Pies, MD;  Location: [facility];  Service: Neurosurgery;  Laterality: Left;  Left Lumbar three-four diskectomy with Lumbar four laminectomy   . LYMPH NODE DISSECTION Bilateral 04/13/2019   Procedure: LYMPH NODE DISSECTION;  Surgeon: Alexis Frock, MD;  [facility];  Service: Urology;  Laterality: Bilateral;  . ORIF LEFT ANKLE FX  2005  . POLYPECTOMY  01/16/2019   Procedure: POLYPECTOMY;  Surgeon: Clarene Essex, MD;  [facility];  Service: Endoscopy;;  . PROSTATE BIOPSY N/A 08/22/2012   Procedure: BIOPSY TRANSRECTAL ULTRASONIC PROSTATE (TUBP);  Surgeon: Fredricka Bonine, MD;  Location: [facility];  Service: Urology;  Laterality: N/A;  . ROBOT ASSITED LAPAROSCOPIC NEPHROURETERECTOMY Left 04/13/2019   Procedure: XI ROBOT ASSITED LAPAROSCOPIC NEPHROURETERECTOMY;  Surgeon: Alexis Frock, MD;  [facility];  Service: Urology;  Laterality: Left;  3 HRS  . TEE WITHOUT CARDIOVERSION N/A 12/31/2014   Procedure: TRANSESOPHAGEAL ECHOCARDIOGRAM (TEE);  Surgeon: Pixie Casino, MD;  Location: [facility];  Service: Cardiovascular;  Laterality: N/A;  ef 55-60%/  mild MR, TR, and PR/  mild LAE and RAE  . TRANSURETHRAL RESECTION OF BLADDER TUMOR  10/22/2011   Procedure: TRANSURETHRAL RESECTION OF BLADDER TUMOR (TURBT);  Surgeon: Fredricka Bonine, MD;  Location: [facility];  Service: Urology;  Laterality: N/A;  TURBT, LEFT URETEROSCOPY, POSSIBLE URETERAL STENT   . URETEROSCOPY  10/22/2011   Procedure: URETEROSCOPY;  Surgeon: Fredricka Bonine, MD;  Location: [facility];  Service: Urology;  Laterality: Left;  . WISDOM TOOTH EXTRACTION      FAMHx:  Family History  Problem Relation Age of Onset  . Leukemia Mother   . Cancer Mother        leukemia  . Heart attack Father   . Cancer Sister        breast  . Hypertension Neg Hx   . Stroke Neg Hx     SOCHx:   reports that he has  never smoked. He has never used smokeless tobacco. He reports current alcohol use of about 7.0 standard drinks of alcohol per week. He reports that he does not use drugs.  ALLERGIES:  Allergies  Allergen Reactions  . Bee Venom Anaphylaxis    Actually Hornets and Wasps.   . Losartan Other (See Comments)    Cough   . Oxycodone Other (See Comments)    ALTERED MENTAL STATUS GETS MEAN    ROS: Pertinent items noted in HPI and remainder of comprehensive ROS otherwise negative.  HOME MEDS: Current Outpatient Medications on File Prior to Visit  Medication Sig Dispense Refill  . allopurinol (ZYLOPRIM) 300 MG tablet Take 300 mg by mouth every morning.     . docusate sodium (COLACE) 100 MG capsule Take 1 capsule (100 mg total) by mouth 2 (two) times daily. 10 capsule 0  . EPINEPHrine 0.3 mg/0.3 mL IJ SOAJ injection Inject 0.3 mLs (0.3 mg total) into the muscle once. (Patient taking differently: Inject 0.3 mg into the muscle daily as needed for anaphylaxis. ) 2 Device 1  . isosorbide mononitrate (IMDUR) 30 MG 24 hr tablet Take 1 tablet (30 mg total) by mouth daily.  Schedule appointment before running out 90 tablet 0  . levothyroxine (SYNTHROID, LEVOTHROID) 50 MCG tablet Take 50 mcg by mouth daily before breakfast.    . metoprolol (LOPRESSOR) 50 MG tablet Take 50 mg by mouth 2 (two) times daily.    . nitroGLYCERIN (NITROSTAT) 0.4 MG SL tablet Place 1 tablet (0.4 mg total) under the tongue every 5 (five) minutes x 3 doses as needed for chest pain. 25 tablet 12  . pravastatin (PRAVACHOL) 40 MG tablet Take 1 tablet (40 mg total) by mouth daily. 30 tablet   . Testosterone 1.62 % GEL Apply 2 Pump topically every other day.      No current facility-administered medications on file prior to visit.    LABS/IMAGING: No results found for this or any previous visit (from the past 48 hour(s)). No results found.  LIPID PANEL:    Component Value Date/Time   CHOL 195 10/10/2018 1141   TRIG 136 10/10/2018 1141   HDL 42 10/10/2018 1141   CHOLHDL 4.6 10/10/2018 1141   CHOLHDL 4.4 12/12/2014 1721   VLDL 18 12/12/2014 1721   LDLCALC 129 (H) 10/10/2018 1141     WEIGHTS: Wt Readings from Last 3 Encounters:  05/02/19 198 lb 12.8 oz (90.2 kg)  04/13/19 216 lb 4.3 oz (98.1 kg)  04/12/19 216 lb 4.3 oz (98.1 kg)    VITALS: BP 90/60   Pulse (!) 55   Temp (!) 96.6 F (35.9 C)   Ht 5\' 7"  (1.702 m)   Wt 198 lb 12.8 oz (90.2 kg)   SpO2 96%   BMI 31.14 kg/m   EXAM: General appearance: alert and no distress Neck: no carotid bruit, no JVD and thyroid not enlarged, symmetric, no tenderness/mass/nodules Lungs: rales bibasilar Heart: regular rate and rhythm Abdomen: soft, non-tender; bowel sounds normal; no masses,  no organomegaly and obese Extremities: extremities normal, atraumatic, no cyanosis or edema Pulses: 2+ and symmetric Skin: Skin color, texture, turgor normal. No rashes or lesions Neurologic: Grossly normal : Pleasant  EKG: Sinus bradycardia 55-personally reviewed  ASSESSMENT: 1. History of multivessel coronary artery disease, including 90% LAD  distal stenosis and 50 to 60% mid LAD stenosis of a tortuous vessel (12/2014), without angina 2. History of bladder CA with hematuria, resolved 3. Failed Watchman LAA procedure in 2016 4. History  of hypertensive heart disease 5. PAF - CHADVASC score of 5 6. Obesity 7. Hyperlipidemia 8. OSA on CPAP 9. History of TIA/stroke 10. Hypothyroidism 11. Possible left upper tract urothelial carcinoma-status post resection and radical left nephro ureterectomy.  PLAN: 1.   Charles Wright recently underwent surgery at the beginning of the month and is still recovering although is noted to be hypotensive today. I suspect this may be related to 20 pound weight loss he is had and decrease in appetite given his current medications. I advised him to decrease his metoprolol to 25 mg twice a day and hold the p.m. dose tonight. He did not take his isosorbide today and I think we can also continue to hold that. He does have short acting nitro to take as needed if he were to develop any chest pain. He should monitor blood pressure closely and stay well-hydrated. Finally, he was noted to have some basilar lung crackles on exam today. The sound more like dry crackles and might be related to some atelectasis. I advised him to use his incentive spirometer and if he gets any worsening shortness of breath, cough or other heart failure symptoms we may need to consider chest x-ray or more work-up.  Pixie Casino, MD, [facility], [place] Director of the Advanced Lipid Disorders &  Cardiovascular Risk Reduction Clinic Diplomate of the American Board of Clinical Lipidology Attending Cardiologist  Direct Dial: [phone]  Fax: [phone]  Website:  [email or web address] 05/02/2019, 11:12 AM

## 2019-05-10 DIAGNOSIS — C642 Malignant neoplasm of left kidney, except renal pelvis: Secondary | ICD-10-CM | POA: Diagnosis not present

## 2019-05-10 DIAGNOSIS — C772 Secondary and unspecified malignant neoplasm of intra-abdominal lymph nodes: Secondary | ICD-10-CM | POA: Diagnosis not present

## 2019-05-10 DIAGNOSIS — E440 Moderate protein-calorie malnutrition: Secondary | ICD-10-CM | POA: Diagnosis not present

## 2019-05-14 DIAGNOSIS — T63461D Toxic effect of venom of wasps, accidental (unintentional), subsequent encounter: Secondary | ICD-10-CM | POA: Diagnosis not present

## 2019-05-14 DIAGNOSIS — T63451D Toxic effect of venom of hornets, accidental (unintentional), subsequent encounter: Secondary | ICD-10-CM | POA: Diagnosis not present

## 2019-05-16 DIAGNOSIS — M109 Gout, unspecified: Secondary | ICD-10-CM | POA: Diagnosis not present

## 2019-05-16 DIAGNOSIS — E7800 Pure hypercholesterolemia, unspecified: Secondary | ICD-10-CM | POA: Diagnosis not present

## 2019-05-16 DIAGNOSIS — I10 Essential (primary) hypertension: Secondary | ICD-10-CM | POA: Diagnosis not present

## 2019-05-16 DIAGNOSIS — R7309 Other abnormal glucose: Secondary | ICD-10-CM | POA: Diagnosis not present

## 2019-05-16 DIAGNOSIS — E559 Vitamin D deficiency, unspecified: Secondary | ICD-10-CM | POA: Diagnosis not present

## 2019-05-16 DIAGNOSIS — E291 Testicular hypofunction: Secondary | ICD-10-CM | POA: Diagnosis not present

## 2019-05-21 DIAGNOSIS — H31001 Unspecified chorioretinal scars, right eye: Secondary | ICD-10-CM | POA: Diagnosis not present

## 2019-05-21 DIAGNOSIS — H43813 Vitreous degeneration, bilateral: Secondary | ICD-10-CM | POA: Diagnosis not present

## 2019-05-21 DIAGNOSIS — H25813 Combined forms of age-related cataract, bilateral: Secondary | ICD-10-CM | POA: Diagnosis not present

## 2019-06-07 ENCOUNTER — Telehealth: Payer: Self-pay | Admitting: Internal Medicine

## 2019-06-07 ENCOUNTER — Telehealth: Payer: PPO | Admitting: Internal Medicine

## 2019-06-07 NOTE — Telephone Encounter (Signed)
Spoke with patient who was scheduled for a telehealth visit with Dr. Debara Pickett today 06/07/2019. Upon call, patient reports he is at our office location as his reminder robo-call told him he was to come to the office. Apologized for inconvenience of this, offered in-office APP appointment for today, or can complete virtual visit. Patient declined both, stating he is doing better, getting his strength back, and BP is 110s/67-70. He was r/s for OV in July 2021.

## 2019-06-12 DIAGNOSIS — H25811 Combined forms of age-related cataract, right eye: Secondary | ICD-10-CM | POA: Diagnosis not present

## 2019-06-12 DIAGNOSIS — H269 Unspecified cataract: Secondary | ICD-10-CM | POA: Diagnosis not present

## 2019-06-13 DIAGNOSIS — T63461D Toxic effect of venom of wasps, accidental (unintentional), subsequent encounter: Secondary | ICD-10-CM | POA: Diagnosis not present

## 2019-06-13 DIAGNOSIS — T63451D Toxic effect of venom of hornets, accidental (unintentional), subsequent encounter: Secondary | ICD-10-CM | POA: Diagnosis not present

## 2019-06-18 DIAGNOSIS — C44319 Basal cell carcinoma of skin of other parts of face: Secondary | ICD-10-CM | POA: Diagnosis not present

## 2019-06-26 DIAGNOSIS — H25812 Combined forms of age-related cataract, left eye: Secondary | ICD-10-CM | POA: Diagnosis not present

## 2019-07-06 DIAGNOSIS — L509 Urticaria, unspecified: Secondary | ICD-10-CM | POA: Diagnosis not present

## 2019-07-06 DIAGNOSIS — T63451D Toxic effect of venom of hornets, accidental (unintentional), subsequent encounter: Secondary | ICD-10-CM | POA: Diagnosis not present

## 2019-07-06 DIAGNOSIS — T63461D Toxic effect of venom of wasps, accidental (unintentional), subsequent encounter: Secondary | ICD-10-CM | POA: Diagnosis not present

## 2019-07-06 DIAGNOSIS — T63441D Toxic effect of venom of bees, accidental (unintentional), subsequent encounter: Secondary | ICD-10-CM | POA: Diagnosis not present

## 2019-07-16 DIAGNOSIS — C61 Malignant neoplasm of prostate: Secondary | ICD-10-CM | POA: Diagnosis not present

## 2019-07-16 DIAGNOSIS — C652 Malignant neoplasm of left renal pelvis: Secondary | ICD-10-CM | POA: Diagnosis not present

## 2019-07-18 ENCOUNTER — Encounter: Payer: Self-pay | Admitting: Physician Assistant

## 2019-07-18 ENCOUNTER — Ambulatory Visit (INDEPENDENT_AMBULATORY_CARE_PROVIDER_SITE_OTHER): Payer: PPO | Admitting: Physician Assistant

## 2019-07-18 ENCOUNTER — Other Ambulatory Visit: Payer: Self-pay

## 2019-07-18 VITALS — BP 92/58 | HR 66 | Temp 97.5°F | Ht 67.0 in | Wt 199.0 lb

## 2019-07-18 DIAGNOSIS — I480 Paroxysmal atrial fibrillation: Secondary | ICD-10-CM

## 2019-07-18 DIAGNOSIS — I951 Orthostatic hypotension: Secondary | ICD-10-CM | POA: Diagnosis not present

## 2019-07-18 DIAGNOSIS — E039 Hypothyroidism, unspecified: Secondary | ICD-10-CM

## 2019-07-18 DIAGNOSIS — E785 Hyperlipidemia, unspecified: Secondary | ICD-10-CM | POA: Diagnosis not present

## 2019-07-18 DIAGNOSIS — N183 Chronic kidney disease, stage 3 unspecified: Secondary | ICD-10-CM

## 2019-07-18 DIAGNOSIS — I2510 Atherosclerotic heart disease of native coronary artery without angina pectoris: Secondary | ICD-10-CM | POA: Diagnosis not present

## 2019-07-18 NOTE — Patient Instructions (Addendum)
Medication Instructions:   STOP Isosorbide Mononitrate (Imdur)  *If you need a refill on your cardiac medications before your next appointment, please call your pharmacy*  Lab Work: NONE ordered at this time of appointment   If you have labs (blood work) drawn today and your tests are completely normal, you will receive your results only by: Marland Kitchen MyChart Message (if you have MyChart) OR . A paper copy in the mail If you have any lab test that is abnormal or we need to change your treatment, we will call you to review the results.   Testing/Procedures: NONE ordered at this time of appointment   Follow-Up: At [facility], you and your health needs are our priority.  As part of our continuing mission to provide you with exceptional heart care, we have created designated Provider Care Teams.  These Care Teams include your primary Cardiologist (physician) and Advanced Practice Providers (APPs -  Physician Assistants and Nurse Practitioners) who all work together to provide you with the care you need, when you need it.  Your next appointment:   As schedule on 08/21/2019 at 1:45PM   The format for your next appointment:   In Person  Provider:   K. Mali Hilty, MD  Other Instructions

## 2019-07-18 NOTE — Progress Notes (Signed)
Cardiology Office Note:    Date:  07/20/2019   ID:  [name], DOB 02/27/1935, MRN [id number]  PCP:  Maury Dus, MD  [facility] Cardiologist:  Pixie Casino, MD  [place] Electrophysiologist:  None   Referring MD: Maury Dus, MD   Chief Complaint  Patient presents with  . Follow-up  . Headache    Very lightheaded lately.    History of Present Illness:    Charles Wright is a 84 y.o. male with a hx of CAD, GERD, Gilbert's syndrome, hyperlipidemia, hypothyroidism, PAF, OSA, history of bladder cancer, prostate cancer and orthostatic hypotension.  Patient had NSTEMI in 2016 and found to have distal LAD lesion that is not amenable to PCI, he also had moderate proximal to mid LAD disease, medical therapy was recommended.  He was previously followed by Dr. Wynonia Lawman and maintained on Plavix but not on aspirin.  In order to decrease his bleeding risk, attempt at [facility] procedure was made in the past that ultimately was unsuccessful.  He was felt not to be a candidate for anticoagulation therapy due to GU bleeding from bladder cancer.   He was last seen by Dr. Debara Pickett in March 2021 for lightheadedness.  Prior to the visit, he had decreased appetite and weight loss after bladder surgery.  His blood pressure at the time was 90/60.  EKG shows sinus bradycardia at 55 bpm.  Patient presents today for evaluation of dizziness upon standing.  This is consistent with his prior diagnosis of orthostatic hypotension.  According to patient, he underwent a nephrectomy procedure.  He is being followed [facility].  Previous lab work in March shows worsening renal function with creatinine greater than 2, however since stent last creatinine obtained in April was down to 1.5.  He had additional lab work obtained yesterday by urology service, we will request the lab work from his PCP and neurology.  He has gained some appetite back and his weight is stable.  I encouraged him to  liberalizing salt intake and fluid intake for the time being.  Also he has not taken himself off of Imdur, we will discontinue this medication today.  I left him on the metoprolol 25 mg twice daily as decreasing the dose may cause recurrence of atrial fibrillation.  Also patient is apparently on the Eliquis 5 mg twice daily, however he is not sure who prescribed it to him.  He is under the impression that Dr. Wynonia Lawman was the one who prescribed the medication and is currently managed by [place] however this is a new medication that was added to his medication list.  Since his previous nephrectomy procedure, he does not have any hematuria.  Last hemoglobin level was borderline low however stable.  Past Medical History:  Diagnosis Date  . Acute gout of left ankle    06-14-2015  . Bladder cancer (HCC)    BCG's tx's  . BPH (benign prostatic hypertrophy)   . CAD (coronary artery disease) CARDIOLOGIST-  DR TILLEY   a. NSTEMI 12/2014 -  99% dLAD-2 (not amenable to PCI), 50% dLAD-1, 60% mLAD. Medical therapy was recommended.  . Cough    HARSH NONPRODUCTIVE COUGH 1 AND 1/2 WEEKS  . GERD (gastroesophageal reflux disease)    takes  OTC periodically  . Gilbert's syndrome   . H/O cardiac catheterization    a. Note: Difficult radial access in 12/2014 - recommend femoral approach if cath needed in the future.  Marland Kitchen History  of kidney stones   . History of non-ST elevation myocardial infarction (NSTEMI)    12-12-2014   CARDIAC CATH W/ NO INTERVENTION X 2FEW DAYS APART  . History of spinal fracture 2002   LUMBAR  . Hyperlipidemia   . Hypertension   . Hypothyroidism   . Myocardial infarction ([place])   . OSA on CPAP    SET ON 7 USES SOME NIGHTS  . Paroxysmal A-fib [facility])    cardiologist-  dr Wynonia Lawman  . Prostate cancer ([place]) 2019   last PSA  2.9  (montiored by urologist  dr Junious Silk) [place] FEB 2019 RAD DONE  . Shingles    2 weeks ago  . Wears glasses     Past Surgical History:  Procedure Laterality Date  .  BACK SURGERY  2014   LOWER l 1, 2 4 AND 5  . BILATERAL INGUINAL HERNIA REPAIR    . CARDIAC CATHETERIZATION N/A 12/13/2014   Procedure: Left Heart Cath and Coronary Angiography;  Surgeon: Leonie Man, MD;  Location: [place] CV [facility];  Service: Cardiovascular;  Laterality: N/A;  culprit lesion diat LAD-2  99%, not approachable via PCTA  due to extremely tortuous up stream LAD/  mLAD 60% and dLAD-1 50%, both are at the extremely tortuous segment and not PCI targets/  otherwise normal coronary arteries  . COLONOSCOPY WITH PROPOFOL N/A 01/16/2019   Procedure: COLONOSCOPY WITH PROPOFOL;  Surgeon: Clarene Essex, MD;  [facility]: [facility];  Service: Endoscopy;  Laterality: N/A;  . CYSTOSCOPY W/ RETROGRADES Left 08/22/2012   Procedure: CYSTOSCOPY WITH RETROGRADE PYELOGRAM;  left kidney washings;  Surgeon: Fredricka Bonine, MD;  Location: [facility];  Service: Urology;  Laterality: Left;  . CYSTOSCOPY W/ RETROGRADES N/A 07/08/2015   Procedure: CYSTOSCOPY WITH RETROGRADE PYELOGRAM;  Surgeon: [name], MD;  Location: [facility];  Service: Urology;  Laterality: N/A;  . CYSTOSCOPY W/ URETERAL STENT PLACEMENT Left 03/24/2019   Procedure: CYSTOSCOPY WITH RETROGRADE PYELOGRAM/URETERAL STENT PLACEMENT ureteroscopy biopsy of neoplasm;  Surgeon: [name], MD;  [facility]: [facility];  Service: Urology;  Laterality: Left;  . CYSTOSCOPY W/ URETERAL STENT PLACEMENT Left 04/08/2019   Procedure: CYSTOSCOPY cystogram clot evacuation left ureteroscopy clot evacuation left stent exchange liimited transuretheral resectio of prastate and fulguration  bleeders of prostate;  Surgeon: [name], MD;  [facility]: [facility];  Service: Urology;  Laterality: Left;  . CYSTOSCOPY WITH BIOPSY  08/22/2012   Procedure: CYSTOSCOPY WITH BIOPSY;  Surgeon: Fredricka Bonine, MD;  Location: [facility];  Service: Urology;;  . [name] WITH BIOPSY N/A 11/08/2014    Procedure: CYSTOSCOPY/BIOPSPY BLADDER INSTILLATION OF MARCAINE AND PYRIDIUM;  Surgeon: [name], MD;  Location: [facility];  Service: Urology;  Laterality: N/A;  . CYSTOSCOPY WITH BIOPSY N/A 07/08/2015   Procedure: CYSTOSCOPY WITH BLADDER BIOPSY, FULGURATION, RETROGRADE PYLOGRAM AND RENAL WASHING;  Surgeon: [name], MD;  Location[facility];  Service: Urology;  Laterality: N/A;  . CYSTOSCOPY WITH RETROGRADE PYELOGRAM, URETEROSCOPY AND STENT PLACEMENT Bilateral 07/30/2014   Procedure: CYSTOSCOPY WITH BLADDER BX FULERGATION AND BILATERAL RETROGRADE PYELOGRAM,;  Surgeon: [name], MD;  [facility];  Service: Urology;  Laterality: Bilateral;  . CYSTOSCOPY WITH STENT PLACEMENT Left 03/24/2018   Procedure: STENT PLACEMENT AND BIOPSY;  Surgeon: [name], MD;  Location: [facility];  Service: Urology;  Laterality: Left;  . CYSTOSCOPY/RETROGRADE/URETEROSCOPY Bilateral 08/17/2013   Procedure: CYSTOSCOPY, BLADDER BIOPSY WITH BILATERAL RETROGRADE PYELOGRAM, LEFT URETEROSCOPY AND DILATION  OF STRICTURE ;  Surgeon: [name], MD;  Location: [facility];  Service: Urology;  Laterality: Bilateral;  . CYSTOSCOPY/RETROGRADE/URETEROSCOPY Left 03/24/2018   Procedure: CYSTOSCOPY/RETROGRADE/URETEROSCOPY;  Surgeon: [name], MD;  Location: [facility];  Service: Urology;  Laterality: Left;  . HOT HEMOSTASIS N/A 01/16/2019   Procedure: HOT HEMOSTASIS (ARGON PLASMA COAGULATION/BICAP);  Surgeon: Clarene Essex, MD;  Location[facility];  Service: Endoscopy;  Laterality: N/A;  . LEFT ATRIAL APPENDAGE OCCLUSION N/A 01/09/2015   Procedure: LEFT ATRIAL APPENDAGE OCCLUSION;  Surgeon: [name], MD;  Location: [place] [facility];  Service: Cardiovascular;  Laterality: N/A;  . LUMBAR LAMINECTOMY/DECOMPRESSION MICRODISCECTOMY Left 12/03/2013   Procedure: Left Lumbar three-four diskectomy with Lumbar four  laminectomy ;  Surgeon: Newman Pies, MD;  Location: [place] [facility];  Service: Neurosurgery;  Laterality: Left;  Left Lumbar three-four diskectomy with Lumbar four laminectomy   . LYMPH NODE DISSECTION Bilateral 04/13/2019   Procedure: LYMPH NODE DISSECTION;  Surgeon: Alexis Frock, MD;  [facility];  Service: Urology;  Laterality: Bilateral;  . ORIF LEFT ANKLE FX  2005  . POLYPECTOMY  01/16/2019   Procedure: POLYPECTOMY;  Surgeon: Clarene Essex, MD;  [facility];  Service: Endoscopy;;  . PROSTATE BIOPSY N/A 08/22/2012   Procedure: BIOPSY TRANSRECTAL ULTRASONIC PROSTATE (TUBP);  Surgeon: Fredricka Bonine, MD;  Location: [facility];  Service: Urology;  Laterality: N/A;  . ROBOT ASSITED LAPAROSCOPIC NEPHROURETERECTOMY Left 04/13/2019   Procedure: XI ROBOT ASSITED LAPAROSCOPIC NEPHROURETERECTOMY;  Surgeon: Alexis Frock, MD;  [facility];  Service: Urology;  Laterality: Left;  3 HRS  . TEE WITHOUT CARDIOVERSION N/A 12/31/2014   Procedure: TRANSESOPHAGEAL ECHOCARDIOGRAM (TEE);  Surgeon: Pixie Casino, MD;  Location: [facility];  Service: Cardiovascular;  Laterality: N/A;  ef 55-60%/  mild MR, TR, and PR/  mild LAE and RAE  . TRANSURETHRAL RESECTION OF BLADDER TUMOR  10/22/2011   Procedure: TRANSURETHRAL RESECTION OF BLADDER TUMOR (TURBT);  Surgeon: Fredricka Bonine, MD;  Location[facility];  Service: Urology;  Laterality: N/A;  TURBT, LEFT URETEROSCOPY, POSSIBLE URETERAL STENT   . URETEROSCOPY  10/22/2011   Procedure: URETEROSCOPY;  Surgeon: Fredricka Bonine, MD;  Location: [facility];  Service: Urology;  Laterality: Left;  . WISDOM TOOTH EXTRACTION      Current Medications: Current Meds  Medication Sig  . allopurinol (ZYLOPRIM) 300 MG tablet Take 300 mg by mouth every morning.   Marland Kitchen apixaban (ELIQUIS) 5 MG TABS tablet Take 5 mg by mouth 2 (two) times daily.  Marland Kitchen aspirin EC 81 MG tablet Take 81 mg by mouth  daily.  Marland Kitchen doxycycline (MONODOX) 50 MG capsule Take 50 mg by mouth daily.  Marland Kitchen EPINEPHrine 0.3 mg/0.3 mL IJ SOAJ injection Inject 0.3 mLs (0.3 mg total) into the muscle once. (Patient taking differently: Inject 0.3 mg into the muscle daily as needed for anaphylaxis. )  . levothyroxine (SYNTHROID, LEVOTHROID) 50 MCG tablet Take 50 mcg by mouth daily before breakfast.  . metoprolol tartrate (LOPRESSOR) 25 MG tablet Take 1 tablet (25 mg total) by mouth 2 (two) times daily.  . nitroGLYCERIN (NITROSTAT) 0.4 MG SL tablet Place 1 tablet (0.4 mg total) under the tongue every 5 (five) minutes x 3 doses as needed for chest pain.  . pravastatin (PRAVACHOL) 40 MG tablet Take 1 tablet (40 mg total) by mouth daily.  . Testosterone 1.62 % GEL Apply 2 Pump topically every other day.   . [DISCONTINUED] docusate sodium (  COLACE) 100 MG capsule Take 1 capsule (100 mg total) by mouth 2 (two) times daily.  . [DISCONTINUED] isosorbide mononitrate (IMDUR) 30 MG 24 hr tablet Take 30 mg by mouth daily.     Allergies:   Bee venom, Losartan, and Oxycodone   Social History   Socioeconomic History  . Marital status: Married    Spouse name: Not on file  . Number of children: Not on file  . Years of education: Not on file  . Highest education level: Not on file  Occupational History  . Occupation: Retired    Fish farm manager: OTHER  Tobacco Use  . Smoking status: Never Smoker  . Smokeless tobacco: Never Used  Vaping Use  . Vaping Use: Never used  Substance and Sexual Activity  . Alcohol use: Yes    Alcohol/week: 7.0 standard drinks    Types: 7 Glasses of wine per week    Comment: red wine glass daily  . Drug use: No  . Sexual activity: Yes  Other Topics Concern  . Not on file  Social History Narrative  . Not on file   Social Determinants of Health   Financial Resource Strain:   . Difficulty of Paying Living Expenses:   Food Insecurity:   . Worried About Charity fundraiser in the Last Year:   . Arboriculturist  in the Last Year:   Transportation Needs:   . Film/video editor (Medical):   Marland Kitchen Lack of Transportation (Non-Medical):   Physical Activity:   . Days of Exercise per Week:   . Minutes of Exercise per Session:   Stress:   . Feeling of Stress :   Social Connections:   . Frequency of Communication with Friends and Family:   . Frequency of Social Gatherings with Friends and Family:   . Attends Religious Services:   . Active Member of Clubs or Organizations:   . Attends Archivist Meetings:   Marland Kitchen Marital Status:      Family History: The patient's family history includes Cancer in his mother and sister; Heart attack in his father; Leukemia in his mother. There is no history of Hypertension or Stroke.  ROS:   Please see the history of present illness.     All other systems reviewed and are negative.  EKGs/Labs/Other Studies Reviewed:    The following studies were reviewed today:  Cath 12/13/2014  Clearly the culprit lesion is the Dist LAD-2 lesion, 99% stenosed. Not approachable via percutaneous approach - due to extremely tortuous up stream LAD  Mid LAD lesion, 60% stenosed. Dist LAD-1 lesion, 50% stenosed. -- Both are at the extremely tortuous segment, and not excellent PCI targets, especially the second lesion  Otherwise essentially angiographic normal coronary arteries  Difficult radial access due to tortuous innominate artery and brachial/radial spasm.  Normal LVEDP - unable to perform LV Gram.    Clearly the patient's culprit lesion was the distal LAD lesion that is not approachable from PTCA standpoint. The moderate lesions in the mid vessel also not excellent PCI targets, and do not look flow-limiting.  Recommendation based on evaluation of images with myself and interventional cardiology peers is to proceed with medical management of small non-ST elevation MI/troponin leak with continued therapeutic doses of enoxaparin. I have also written for Plavix that  could be considered to continue for at least a month given the ACS presentation.  If he were to fail medical management, we could consider potentially evaluation and treatment of the more  proximal of the mid LAD lesions. However I would recommend femoral approach based on the difficulty with catheter placement and concern for spasm from a radial approach.  The patient will return to his nursing unit for ongoing care. I have written for treatment dose enoxaparin to be dosed tonight at 8 PM which would be 6 hours posterior band removal.    EKG:  EKG is not ordered today.    Recent Labs: 10/10/2018: TSH 7.150 03/18/2019: ALT 20 04/24/2019: BUN 26; Creatinine, Ser 2.12; Hemoglobin 10.9; Platelets 476; Potassium 4.2; Sodium 133  Recent Lipid Panel    Component Value Date/Time   CHOL 195 10/10/2018 1141   TRIG 136 10/10/2018 1141   HDL 42 10/10/2018 1141   CHOLHDL 4.6 10/10/2018 1141   CHOLHDL 4.4 12/12/2014 1721   VLDL 18 12/12/2014 1721   LDLCALC 129 (H) 10/10/2018 1141    Physical Exam:    VS:  BP (!) 92/58 (BP Location: Right Arm, Patient Position: Sitting, Cuff Size: Large)   Pulse 66   Temp (!) 97.5 F (36.4 C)   Ht 5\' 7"  (1.702 m)   Wt 199 lb (90.3 kg)   SpO2 97%   BMI 31.17 kg/m     Wt Readings from Last 3 Encounters:  07/18/19 199 lb (90.3 kg)  05/02/19 198 lb 12.8 oz (90.2 kg)  04/13/19 216 lb 4.3 oz (98.1 kg)     GEN:  Well nourished, well developed in no acute distress HEENT: Normal NECK: No JVD; No carotid bruits LYMPHATICS: No lymphadenopathy CARDIAC: RRR, no murmurs, rubs, gallops RESPIRATORY:  Clear to auscultation without rales, wheezing or rhonchi  ABDOMEN: Soft, non-tender, non-distended MUSCULOSKELETAL:  No edema; No deformity  SKIN: Warm and dry NEUROLOGIC:  Alert and oriented x 3 PSYCHIATRIC:  Normal affect   ASSESSMENT:    1. Orthostatic hypotension   2. Coronary artery disease involving native coronary artery of native heart without angina  pectoris   3. Hyperlipidemia LDL goal <70   4. Hypothyroidism, unspecified type   5. PAF (paroxysmal atrial fibrillation) (HCC)   6. Stage 3 chronic kidney disease, unspecified whether stage 3a or 3b CKD    PLAN:    In order of problems listed above:  1. Orthostatic hypotension: Blood pressure continue to be borderline, apparently he has not stopped his Imdur despite instructed to do so during the last office visit.  We will take him off of Imdur today  2. CAD: Previous cardiac catheterization in 2016 showed a distal LAD disease that was managed medically  3. Hyperlipidemia: Continue statin therapy  4. Hypothyroidism: On Synthroid  5. PAF: He has previously failed watchman device.  However our watchman program is likely going to restart later half of this year.  With upgraded watchman device, I think he may be a good candidate.  He is willing to consider this once the program is up and running.  He is currently on Eliquis 5 mg twice daily, however this is a new medication our list.  He is not sure who prescribed it.  Continue metoprolol  6. CKD stage III: history of nephrectomy per patient.  Followed by nephrology service.   Medication Adjustments/Labs and Tests Ordered: Current medicines are reviewed at length with the patient today.  Concerns regarding medicines are outlined above.  No orders of the defined types were placed in this encounter.  No orders of the defined types were placed in this encounter.   Patient Instructions  Medication Instructions:   STOP Isosorbide  Mononitrate (Imdur)  *If you need a refill on your cardiac medications before your next appointment, please call your pharmacy*  Lab Work: NONE ordered at this time of appointment   If you have labs (blood work) drawn today and your tests are completely normal, you will receive your results only by: Marland Kitchen MyChart Message (if you have MyChart) OR . A paper copy in the mail If you have any lab test that is  abnormal or we need to change your treatment, we will call you to review the results.   Testing/Procedures: NONE ordered at this time of appointment   Follow-Up: At [facility], you and your health needs are our priority.  As part of our continuing mission to provide you with exceptional heart care, we have created designated Provider Care Teams.  These Care Teams include your primary Cardiologist (physician) and Advanced Practice Providers (APPs -  Physician Assistants and Nurse Practitioners) who all work together to provide you with the care you need, when you need it.  Your next appointment:   As schedule on 08/21/2019 at 1:45PM   The format for your next appointment:   In Person  Provider:   K. Mali Hilty, MD  Other Instructions      Signed, Almyra Deforest, [place]  07/20/2019 11:59 PM    [place]

## 2019-07-20 ENCOUNTER — Encounter: Payer: Self-pay | Admitting: Physician Assistant

## 2019-08-14 DIAGNOSIS — T63451D Toxic effect of venom of hornets, accidental (unintentional), subsequent encounter: Secondary | ICD-10-CM | POA: Diagnosis not present

## 2019-08-14 DIAGNOSIS — T63461D Toxic effect of venom of wasps, accidental (unintentional), subsequent encounter: Secondary | ICD-10-CM | POA: Diagnosis not present

## 2019-08-16 DIAGNOSIS — D225 Melanocytic nevi of trunk: Secondary | ICD-10-CM | POA: Diagnosis not present

## 2019-08-16 DIAGNOSIS — L570 Actinic keratosis: Secondary | ICD-10-CM | POA: Diagnosis not present

## 2019-08-16 DIAGNOSIS — L821 Other seborrheic keratosis: Secondary | ICD-10-CM | POA: Diagnosis not present

## 2019-08-16 DIAGNOSIS — L814 Other melanin hyperpigmentation: Secondary | ICD-10-CM | POA: Diagnosis not present

## 2019-08-16 DIAGNOSIS — D1801 Hemangioma of skin and subcutaneous tissue: Secondary | ICD-10-CM | POA: Diagnosis not present

## 2019-08-16 DIAGNOSIS — Z85828 Personal history of other malignant neoplasm of skin: Secondary | ICD-10-CM | POA: Diagnosis not present

## 2019-08-16 DIAGNOSIS — L905 Scar conditions and fibrosis of skin: Secondary | ICD-10-CM | POA: Diagnosis not present

## 2019-08-17 ENCOUNTER — Encounter (INDEPENDENT_AMBULATORY_CARE_PROVIDER_SITE_OTHER): Payer: Self-pay

## 2019-08-21 ENCOUNTER — Ambulatory Visit: Payer: PPO | Admitting: Internal Medicine

## 2019-08-21 ENCOUNTER — Encounter: Payer: Self-pay | Admitting: Internal Medicine

## 2019-08-21 ENCOUNTER — Other Ambulatory Visit: Payer: Self-pay

## 2019-08-21 VITALS — BP 116/72 | HR 63 | Ht 67.0 in | Wt 200.8 lb

## 2019-08-21 DIAGNOSIS — E785 Hyperlipidemia, unspecified: Secondary | ICD-10-CM

## 2019-08-21 DIAGNOSIS — I480 Paroxysmal atrial fibrillation: Secondary | ICD-10-CM | POA: Diagnosis not present

## 2019-08-21 DIAGNOSIS — I2510 Atherosclerotic heart disease of native coronary artery without angina pectoris: Secondary | ICD-10-CM | POA: Diagnosis not present

## 2019-08-21 DIAGNOSIS — I951 Orthostatic hypotension: Secondary | ICD-10-CM

## 2019-08-21 NOTE — Patient Instructions (Signed)

## 2019-08-21 NOTE — Progress Notes (Signed)
OFFICE NOTE  Chief Complaint:  Complaints, feels well  Primary Care Physician: Maury Dus, MD  HPI:  Charles Wright is a 84 y.o. male with a past medial history significant for coronary artery disease with non-STEMI in 2016.  He was found to have distal LAD disease not amenable to PCI as well as moderate mid to proximal LAD disease.  Medical therapy was recommended.  He also has a history of bladder cancer, prostate cancer, hyperlipidemia, hypertension and OSA on CPAP.  He has been followed by Dr. Wynonia Lawman.  He maintains on clopidogrel but not aspirin.  Recently he has been having issues with ureteral stricture and is recommended to have a cystoscopy with Dr. Junious Silk.  He would require clearance including holding clopidogrel prior to that procedure.  Today in the office he is coughing significantly.  He apparently had upper respiratory infection and then also had concomitant rash which was painful and vesicular and diagnosed as shingles by a dermatologist that he is friends with.  Besides his shortness of breath related upper respiratory infection as well as chest wall pain from shingles, he denies any chest pain concerning for angina or recent worsening shortness of breath.  10/10/2018  Charles Wright returns today for follow-up of coronary disease and atrial fibrillation.  He is a former patient of Dr. Wynonia Lawman however Dr. Wynonia Lawman is now retired.  When we last spoke he received preoperative clearance for urologic procedure.  He does have a history of a bladder cancer and hematuria.  Interestingly, based on further chart review, he had had a prior watchman procedure for his A. fib attempt at stroke reduction.  At that time because of bleeding he was not felt to be a candidate for anticoagulation for his A. fib.  He had been on amiodarone.  He was then referred for watchman procedure which shows ultimately unsuccessful.  I was involved in the imaging component of that procedure.  Subsequently it was  recommended he go on aspirin and Plavix since he was not a candidate for anticoagulation due to GU bleeding from bladder cancer.  That has however been successfully treated and has had no further bleeding issues.  He also just had recent ureteral stenting which seems to have been successful.  When reviewing his chart today however it it notes that he is only on clopidogrel monotherapy.  Have an STEMI in 2016 and was found to have significant coronary disease however not amenable to intervention.  He is done well with that without any further chest pain however did have significant fatigue recently.  It turned out this was related to low testosterone and he is now on testosterone replacement.  Likely a side effect of antiandrogen therapy.  Recent labs show total cholesterol 158, HDL 50 LDL, 86 and triglycerides 115.  05/02/2019  Charles Wright returns today for follow-up. He is complaining of some issues with lightheadedness. He recently underwent surgery for what was suspicious for a left upper tract urothelial carcinoma. This was complicated by pain and bladder spasms requiring multiple ER visits. He has had a decreased appetite and weight loss (5 pounds of which was his left kidney/ureter. Additionally, he is down a total of 20 pounds since I last saw him. This is pertinent because his blood pressure today was 90/60. In general it has run higher than this. I suspect it may be a reason why feels lightheaded. His EKG shows a sinus bradycardia at 55. Possible medications that could causes include his isosorbide and metoprolol  which she takes 50 mg twice daily.  08/21/2019  Charles Wright seen today in follow-up.  Overall he continues to do well.  He says since he had his nephrectomies had no further bleeding issues.  He denies any chest pain or worsening shortness of breath.  Recently had some orthostatic hypotension with low blood pressure noted to be in the 53G systolic.  He was taken off of his Imdur and his  beta-blocker was decreased.  Blood pressure today was 116/72 and his mention he feels well.  PMHx:  Past Medical History:  Diagnosis Date   Acute gout of left ankle    06-14-2015   Bladder cancer (HCC)    BCG's tx's   BPH (benign prostatic hypertrophy)    CAD (coronary artery disease) CARDIOLOGIST-  DR TILLEY   a. NSTEMI 12/2014 -  99% dLAD-2 (not amenable to PCI), 50% dLAD-1, 60% mLAD. Medical therapy was recommended.   Cough    HARSH NONPRODUCTIVE COUGH 1 AND 1/2 WEEKS   GERD (gastroesophageal reflux disease)    takes  OTC periodically   Gilbert's syndrome    H/O cardiac catheterization    a. Note: Difficult radial access in 12/2014 - recommend femoral approach if cath needed in the future.   History of kidney stones    History of non-ST elevation myocardial infarction (NSTEMI)    12-12-2014   CARDIAC CATH W/ NO INTERVENTION X 2FEW DAYS APART   History of spinal fracture 2002   LUMBAR   Hyperlipidemia    Hypertension    Hypothyroidism    Myocardial infarction (HCC)    OSA on CPAP    SET ON 7 USES SOME NIGHTS   Paroxysmal A-fib [facility])    cardiologist-  dr Wynonia Lawman   Prostate cancer [facility]) 2019   last PSA  2.9  (montiored by urologist  dr Junious Silk) [place] FEB 2019 RAD DONE   Shingles    2 weeks ago   Wears glasses     Past Surgical History:  Procedure Laterality Date   BACK SURGERY  2014   LOWER l 1, 2 4 AND 5   BILATERAL INGUINAL HERNIA REPAIR     CARDIAC CATHETERIZATION N/A 12/13/2014   Procedure: Left Heart Cath and Coronary Angiography;  Surgeon: Leonie Man, MD;  Location: [place] [facility];  Service: Cardiovascular;  Laterality: N/A;  culprit lesion diat LAD-2  99%, not approachable via PCTA  due to extremely tortuous up stream LAD/  mLAD 60% and dLAD-1 50%, both are at the extremely tortuous segment and not PCI targets/  otherwise normal coronary arteries   COLONOSCOPY WITH PROPOFOL N/A 01/16/2019   Procedure: COLONOSCOPY WITH PROPOFOL;   Surgeon: Clarene Essex, MD;  Location: [facility];  Service: Endoscopy;  Laterality: N/A;   CYSTOSCOPY W/ RETROGRADES Left 08/22/2012   Procedure: CYSTOSCOPY WITH RETROGRADE PYELOGRAM;  left kidney washings;  Surgeon: Fredricka Bonine, MD;  Location: [facility];  Service: Urology;  Laterality: Left;   CYSTOSCOPY W/ RETROGRADES N/A 07/08/2015   Procedure: CYSTOSCOPY WITH RETROGRADE PYELOGRAM;  Surgeon: [name], MD;  [facility];  Service: Urology;  Laterality: N/A;   CYSTOSCOPY W/ URETERAL STENT PLACEMENT Left 03/24/2019   Procedure: CYSTOSCOPY WITH RETROGRADE PYELOGRAM/URETERAL STENT PLACEMENT ureteroscopy biopsy of neoplasm;  Surgeon: [name], MD;  Location: [facility];  Service: Urology;  Laterality: Left;   CYSTOSCOPY W/ URETERAL STENT PLACEMENT Left 04/08/2019   Procedure: CYSTOSCOPY cystogram clot evacuation left ureteroscopy clot evacuation left stent  exchange liimited transuretheral resectio of prastate and fulguration  bleeders of prostate;  Surgeon: [name], MD;  Location: [facility];  Service: Urology;  Laterality: Left;   CYSTOSCOPY WITH BIOPSY  08/22/2012   Procedure: CYSTOSCOPY WITH BIOPSY;  Surgeon: Fredricka Bonine, MD;  Location: [facility];  Service: Urology;;   CYSTOSCOPY WITH BIOPSY N/A 11/08/2014   Procedure: CYSTOSCOPY/BIOPSPY BLADDER INSTILLATION OF MARCAINE AND PYRIDIUM;  Surgeon: [name], MD;  Location: [facility];  Service: Urology;  Laterality: N/A;   CYSTOSCOPY WITH BIOPSY N/A 07/08/2015   Procedure: CYSTOSCOPY WITH BLADDER BIOPSY, FULGURATION, RETROGRADE PYLOGRAM AND RENAL WASHING;  Surgeon: [name], MD;  Location: [facility];  Service: Urology;  Laterality: N/A;   CYSTOSCOPY WITH RETROGRADE PYELOGRAM, URETEROSCOPY AND STENT PLACEMENT Bilateral 07/30/2014   Procedure: CYSTOSCOPY WITH BLADDER BX FULERGATION AND BILATERAL RETROGRADE  PYELOGRAM,;  Surgeon: [name], MD;  [facility];  Service: Urology;  Laterality: Bilateral;   CYSTOSCOPY WITH STENT PLACEMENT Left 03/24/2018   Procedure: STENT PLACEMENT AND BIOPSY;  Surgeon: [name], MD;  Location: [facility];  Service: Urology;  Laterality: Left;   CYSTOSCOPY/RETROGRADE/URETEROSCOPY Bilateral 08/17/2013   Procedure: CYSTOSCOPY, BLADDER BIOPSY WITH BILATERAL RETROGRADE PYELOGRAM, LEFT URETEROSCOPY AND DILATION OF STRICTURE ;  Surgeon: [name], MD;  Location: [facility];  Service: Urology;  Laterality: Bilateral;   CYSTOSCOPY/RETROGRADE/URETEROSCOPY Left 03/24/2018   Procedure: CYSTOSCOPY/RETROGRADE/URETEROSCOPY;  Surgeon: [name], MD;  Location: [facility];  Service: Urology;  Laterality: Left;   HOT HEMOSTASIS N/A 01/16/2019   Procedure: HOT HEMOSTASIS (ARGON PLASMA COAGULATION/BICAP);  Surgeon: Clarene Essex, MD;  Location: [facility];  Service: Endoscopy;  Laterality: N/A;   LEFT ATRIAL APPENDAGE OCCLUSION N/A 01/09/2015   Procedure: LEFT ATRIAL APPENDAGE OCCLUSION;  Surgeon: [name], MD;  Location: [place] [facility];  Service: Cardiovascular;  Laterality: N/A;   LUMBAR LAMINECTOMY/DECOMPRESSION MICRODISCECTOMY Left 12/03/2013   Procedure: Left Lumbar three-four diskectomy with Lumbar four laminectomy ;  Surgeon: Newman Pies, MD;  Location: [place] [facility];  Service: Neurosurgery;  Laterality: Left;  Left Lumbar three-four diskectomy with Lumbar four laminectomy    LYMPH NODE DISSECTION Bilateral 04/13/2019   Procedure: LYMPH NODE DISSECTION;  Surgeon: Alexis Frock, MD;  [facility];  Service: Urology;  Laterality: Bilateral;   ORIF LEFT ANKLE FX  2005   POLYPECTOMY  01/16/2019   Procedure: POLYPECTOMY;  Surgeon: Clarene Essex, MD;  [facility];  Service: Endoscopy;;   PROSTATE BIOPSY N/A 08/22/2012   Procedure: BIOPSY TRANSRECTAL ULTRASONIC PROSTATE (TUBP);   Surgeon: Fredricka Bonine, MD;  [facility];  Service: Urology;  Laterality: N/A;   ROBOT ASSITED LAPAROSCOPIC NEPHROURETERECTOMY Left 04/13/2019   Procedure: XI ROBOT ASSITED LAPAROSCOPIC NEPHROURETERECTOMY;  Surgeon: Alexis Frock, MD;  Location[facility];  Service: Urology;  Laterality: Left;  3 HRS   TEE WITHOUT CARDIOVERSION N/A 12/31/2014   Procedure: TRANSESOPHAGEAL ECHOCARDIOGRAM (TEE);  Surgeon: Pixie Casino, MD;  Location: [facility];  Service: Cardiovascular;  Laterality: N/A;  ef 55-60%/  mild MR, TR, and PR/  mild LAE and RAE   TRANSURETHRAL RESECTION OF BLADDER TUMOR  10/22/2011   Procedure: TRANSURETHRAL RESECTION OF BLADDER TUMOR (TURBT);  Surgeon: Fredricka Bonine, MD;  Location: [facility];  Service: Urology;  Laterality: N/A;  TURBT, LEFT URETEROSCOPY, POSSIBLE URETERAL STENT    URETEROSCOPY  10/22/2011   Procedure: URETEROSCOPY;  Surgeon: Fredricka Bonine, MD;  Location:  SURGERY  [facility];  Service: Urology;  Laterality: Left;   WISDOM TOOTH EXTRACTION      FAMHx:  Family History  Problem Relation Age of Onset   Leukemia Mother    Cancer Mother        leukemia   Heart attack Father    Cancer Sister        breast   Hypertension Neg Hx    Stroke Neg Hx     SOCHx:   reports that he has never smoked. He has never used smokeless tobacco. He reports current alcohol use of about 7.0 standard drinks of alcohol per week. He reports that he does not use drugs.  ALLERGIES:  Allergies  Allergen Reactions   Bee Venom Anaphylaxis    Actually Hornets and Wasps.    Losartan Other (See Comments)    Cough    Oxycodone Other (See Comments)    ALTERED MENTAL STATUS GETS MEAN    ROS: Pertinent items noted in HPI and remainder of comprehensive ROS otherwise negative.  HOME MEDS: Current Outpatient Medications on File Prior to Visit  Medication Sig Dispense Refill   allopurinol  (ZYLOPRIM) 300 MG tablet Take 300 mg by mouth every morning.      apixaban (ELIQUIS) 5 MG TABS tablet Take 5 mg by mouth 2 (two) times daily.     aspirin EC 81 MG tablet Take 81 mg by mouth daily.     doxycycline (MONODOX) 50 MG capsule Take 50 mg by mouth daily.     EPINEPHrine 0.3 mg/0.3 mL IJ SOAJ injection Inject 0.3 mLs (0.3 mg total) into the muscle once. (Patient taking differently: Inject 0.3 mg into the muscle daily as needed for anaphylaxis. ) 2 Device 1   levothyroxine (SYNTHROID, LEVOTHROID) 50 MCG tablet Take 50 mcg by mouth daily before breakfast.     metoprolol tartrate (LOPRESSOR) 25 MG tablet Take 1 tablet (25 mg total) by mouth 2 (two) times daily. 180 tablet 3   nitroGLYCERIN (NITROSTAT) 0.4 MG SL tablet Place 1 tablet (0.4 mg total) under the tongue every 5 (five) minutes x 3 doses as needed for chest pain. 25 tablet 12   pravastatin (PRAVACHOL) 40 MG tablet Take 1 tablet (40 mg total) by mouth daily. 30 tablet    Testosterone 1.62 % GEL Apply 2 Pump topically every other day.      No current facility-administered medications on file prior to visit.    LABS/IMAGING: No results found for this or any previous visit (from the past 48 hour(s)). No results found.  LIPID PANEL:    Component Value Date/Time   CHOL 195 10/10/2018 1141   TRIG 136 10/10/2018 1141   HDL 42 10/10/2018 1141   CHOLHDL 4.6 10/10/2018 1141   CHOLHDL 4.4 12/12/2014 1721   VLDL 18 12/12/2014 1721   LDLCALC 129 (H) 10/10/2018 1141     WEIGHTS: Wt Readings from Last 3 Encounters:  08/21/19 200 lb 12.8 oz (91.1 kg)  07/18/19 199 lb (90.3 kg)  05/02/19 198 lb 12.8 oz (90.2 kg)    VITALS: BP 116/72    Pulse 63    Ht 5\' 7"  (1.702 m)    Wt 200 lb 12.8 oz (91.1 kg)    SpO2 96%    BMI 31.45 kg/m   EXAM: General appearance: alert and no distress Neck: no carotid bruit, no JVD and thyroid not enlarged, symmetric, no tenderness/mass/nodules Lungs: rales bibasilar Heart: regular rate and  rhythm Abdomen: soft, non-tender; bowel sounds normal; no masses,  no organomegaly and obese Extremities: extremities normal, atraumatic, no cyanosis or edema Pulses: 2+ and symmetric Skin: Skin color, texture, turgor normal. No rashes or lesions Neurologic: Grossly normal : Pleasant  EKG: Normal sinus rhythm 63-personally reviewed  ASSESSMENT: 1. History of multivessel coronary artery disease, including 90% LAD distal stenosis and 50 to 60% mid LAD stenosis of a tortuous vessel (12/2014), without angina 2. History of bladder CA with hematuria, resolved 3. Failed Watchman LAA procedure in 2016 4. History of hypertensive heart disease 5. PAF - CHADVASC score of 5 6. Obesity 7. Hyperlipidemia 8. OSA on CPAP 9. History of TIA/stroke 10. Hypothyroidism 11. Possible left upper tract urothelial carcinoma-status post resection and radical left nephro ureterectomy.  PLAN: 1.   Mr. Capizzi is now doing well without any recurrent bleeding or issues related to his cancer.  He is back on Eliquis and low-dose aspirin.  He has had no further TIA or stroke.  Heart rate is controlled and blood pressure is normal today.  After recent reduction in his medications he no longer has any orthostatic symptoms.  No changes to his medicines.  Follow-up with me in 6 months or sooner as necessary.  Pixie Casino, MD, [facility], [place] Director of the Advanced Lipid Disorders &  Cardiovascular Risk Reduction Clinic Diplomate of the American Board of Clinical Lipidology Attending Cardiologist  Direct Dial: [phone]   Fax: [phone]  Website:  [email or web address] 08/21/2019, 1:53 PM

## 2019-08-30 ENCOUNTER — Other Ambulatory Visit: Payer: Self-pay | Admitting: Internal Medicine

## 2019-08-30 NOTE — Telephone Encounter (Signed)
22m 91.1kg Scr 2.12 (04/24/19) Pt requesting eliquis 5mg  when based on age and scr he needs to be reevaluated for a dose change. Will route to pharmd pool for further review

## 2019-08-30 NOTE — Telephone Encounter (Signed)
Scr = 1.1 on 07-16-2019 per [facility]

## 2019-08-30 NOTE — Telephone Encounter (Signed)
6M 91.1KG SCR 2.12 (04/24/19) Pt is requesting a refill of the eliquis 5mg  to be sent to [facility] but they require a dose change to the 2.5mg  based on dosing guidelines. Will route to the pharmd pool for further review

## 2019-09-05 DIAGNOSIS — T63441D Toxic effect of venom of bees, accidental (unintentional), subsequent encounter: Secondary | ICD-10-CM | POA: Diagnosis not present

## 2019-09-18 DIAGNOSIS — Z8553 Personal history of malignant neoplasm of renal pelvis: Secondary | ICD-10-CM | POA: Diagnosis not present

## 2019-09-18 DIAGNOSIS — R351 Nocturia: Secondary | ICD-10-CM | POA: Diagnosis not present

## 2019-09-18 DIAGNOSIS — Z8546 Personal history of malignant neoplasm of prostate: Secondary | ICD-10-CM | POA: Diagnosis not present

## 2019-10-23 ENCOUNTER — Telehealth: Payer: Self-pay | Admitting: Internal Medicine

## 2019-10-23 ENCOUNTER — Encounter (HOSPITAL_COMMUNITY): Payer: Self-pay

## 2019-10-23 ENCOUNTER — Emergency Department (HOSPITAL_COMMUNITY)
Admission: EM | Admit: 2019-10-23 | Discharge: 2019-10-23 | Disposition: A | Discharge status: Home / Self Care | Payer: PPO | Attending: Emergency Medicine | Admitting: Emergency Medicine

## 2019-10-23 ENCOUNTER — Emergency Department (HOSPITAL_COMMUNITY): Payer: PPO

## 2019-10-23 DIAGNOSIS — Z5321 Procedure and treatment not carried out due to patient leaving prior to being seen by health care provider: Secondary | ICD-10-CM | POA: Diagnosis not present

## 2019-10-23 DIAGNOSIS — R079 Chest pain, unspecified: Secondary | ICD-10-CM | POA: Insufficient documentation

## 2019-10-23 DIAGNOSIS — C61 Malignant neoplasm of prostate: Secondary | ICD-10-CM | POA: Diagnosis not present

## 2019-10-23 DIAGNOSIS — C652 Malignant neoplasm of left renal pelvis: Secondary | ICD-10-CM | POA: Diagnosis not present

## 2019-10-23 DIAGNOSIS — Z7901 Long term (current) use of anticoagulants: Secondary | ICD-10-CM | POA: Insufficient documentation

## 2019-10-23 LAB — BASIC METABOLIC PANEL
Anion gap: 10 (ref 5–15)
BUN: 23 mg/dL (ref 8–23)
CO2: 21 mmol/L — ABNORMAL LOW (ref 22–32)
Calcium: 9 mg/dL (ref 8.9–10.3)
Chloride: 107 mmol/L (ref 98–111)
Creatinine, Ser: 1.77 mg/dL — ABNORMAL HIGH (ref 0.61–1.24)
GFR calc Af Amer: 41 mL/min — ABNORMAL LOW (ref >60)
GFR calc non Af Amer: 35 mL/min — ABNORMAL LOW (ref >60)
Glucose, Bld: 102 mg/dL — ABNORMAL HIGH (ref 70–99)
Potassium: 4.1 mmol/L (ref 3.5–5.1)
Sodium: 138 mmol/L (ref 135–145)

## 2019-10-23 LAB — CBC
HCT: 36.9 % — ABNORMAL LOW (ref 39.0–52.0)
Hemoglobin: 10.7 g/dL — ABNORMAL LOW (ref 13.0–17.0)
MCH: 21.4 pg — ABNORMAL LOW (ref 26.0–34.0)
MCHC: 29 g/dL — ABNORMAL LOW (ref 30.0–36.0)
MCV: 73.9 fL — ABNORMAL LOW (ref 80.0–100.0)
Platelets: 334 10*3/uL (ref 150–400)
RBC: 4.99 MIL/uL (ref 4.22–5.81)
RDW: 18.8 % — ABNORMAL HIGH (ref 11.5–15.5)
WBC: 10.2 10*3/uL (ref 4.0–10.5)
nRBC: 0 % (ref 0.0–0.2)

## 2019-10-23 LAB — TROPONIN I (HIGH SENSITIVITY): Troponin I (High Sensitivity): 8 ng/L (ref <18)

## 2019-10-23 LAB — PROTIME-INR
INR: 1.3 — ABNORMAL HIGH (ref 0.8–1.2)
Prothrombin Time: 15.4 seconds — ABNORMAL HIGH (ref 11.4–15.2)

## 2019-10-23 NOTE — Telephone Encounter (Signed)
Called and spoke with pt he reports that he has had CP for 2 hours and it is a burning pain in his chest from his sternum to his throat and on the left side towards his neck. Pt reports having felt light headed. He states he took [employer] and it hasn't helped. He reports it has "kind of gone away but is still there" He reports his BP is 99/77 with a HR of 93 and he has no energy. He denies SOB or HA.  Advised that with active chest pain we advise he go to the ED to be evaluated. Pt reports that he has disrespect for the ED. Pt then placed his wife on the phone who is a retired Marine scientist. She reports that he ate a sandwich and was out working in the sun and all he had was a glass of milk so she isnt sure this isnt just heart burn. She reports that if he goes to the ED they will sit there for hours and that he "almost died from excruciating pain and had to have surgery and it took hours before he was seen". Notified that I cannot differentiate heart burn from chest pain over the phone and that in the ED they could get an EKG and draw labs. Wife verbalized that if we think it is best they go to the ED then they will. Advised to go to [facility] ED and I notified I would call ahead and notify that they were coming. Wife verbalized understanding and stated they would go.  Called and spoke with Trish Recruitment consultant) at the hospital, notified that the pt was being brought by his wife for chest pain. Wannetta Sender was thankful for the notification with no other questions at this.

## 2019-10-23 NOTE — ED Triage Notes (Signed)
Pt reports 2 episodes of chest pain today, no pain at this time. No other symptoms, just pain. Pt takes eliquis. Pt a.o, nad noted at this time

## 2019-10-23 NOTE — Telephone Encounter (Signed)
Thanks .. I agree with that recommendation.  Dr Lemmie Evens

## 2019-10-23 NOTE — ED Notes (Signed)
Pt decided to leave stating that his pain has gone away and that he'll contact his doctor tomorrow.

## 2019-10-23 NOTE — Telephone Encounter (Signed)
Pt c/o of Chest Pain: STAT if CP now or developed within 24 hours  1. Are you having CP right now? Yes   2. Are you experiencing any other symptoms (ex. SOB, nausea, vomiting, sweating)? Lightheadedness  3. How long have you been experiencing CP? 1.5 hours  4. Is your CP continuous or coming and going? continuous  5. Have you taken Nitroglycerin? Patient states he took 2 Nitroglycerin  Patient is experiencing CP that radiates from the center of his chest, to the left side of his chest and neck. He states the CP began about an hour and a half ago and he took 2 Nitroglycerin- however, the medication did not seem to help. He reports his BP is currently 107/77 with a HR of 83. Patient also states he feels as if he lost all his energy. Attempted to contact triage, no answer. Please return call. ?

## 2019-10-24 NOTE — Telephone Encounter (Signed)
Patient went to ED and left.  He has OV scheduled on 10/26/19 with Almyra Deforest PA

## 2019-10-26 ENCOUNTER — Encounter: Payer: Self-pay | Admitting: Physician Assistant

## 2019-10-26 ENCOUNTER — Ambulatory Visit: Payer: PPO | Admitting: Physician Assistant

## 2019-10-26 ENCOUNTER — Other Ambulatory Visit: Payer: Self-pay

## 2019-10-26 VITALS — BP 120/75 | HR 76 | Ht 67.0 in | Wt 201.6 lb

## 2019-10-26 DIAGNOSIS — E039 Hypothyroidism, unspecified: Secondary | ICD-10-CM

## 2019-10-26 DIAGNOSIS — Z85528 Personal history of other malignant neoplasm of kidney: Secondary | ICD-10-CM | POA: Diagnosis not present

## 2019-10-26 DIAGNOSIS — I480 Paroxysmal atrial fibrillation: Secondary | ICD-10-CM | POA: Diagnosis not present

## 2019-10-26 DIAGNOSIS — I25119 Atherosclerotic heart disease of native coronary artery with unspecified angina pectoris: Secondary | ICD-10-CM | POA: Diagnosis not present

## 2019-10-26 DIAGNOSIS — Z905 Acquired absence of kidney: Secondary | ICD-10-CM | POA: Diagnosis not present

## 2019-10-26 DIAGNOSIS — E785 Hyperlipidemia, unspecified: Secondary | ICD-10-CM

## 2019-10-26 DIAGNOSIS — R079 Chest pain, unspecified: Secondary | ICD-10-CM | POA: Diagnosis not present

## 2019-10-26 DIAGNOSIS — I951 Orthostatic hypotension: Secondary | ICD-10-CM | POA: Diagnosis not present

## 2019-10-26 DIAGNOSIS — C652 Malignant neoplasm of left renal pelvis: Secondary | ICD-10-CM | POA: Diagnosis not present

## 2019-10-26 NOTE — Progress Notes (Signed)
Cardiology Office Note:    Date:  10/28/2019   ID:  [name], DOB 1935/07/20, MRN [id number]  PCP:  Maury Dus, MD  [facility] Cardiologist:  Pixie Casino, MD  [place] Electrophysiologist:  None   Referring MD: Maury Dus, MD   Chief Complaint  Patient presents with  . Follow-up    seen for Dr. Debara Pickett    History of Present Illness:    Charles Wright is a 84 y.o. male with a hx of CAD, GERD, Gilbert's syndrome, hyperlipidemia, hypothyroidism, PAF, OSA, history of bladder cancer, prostate cancer and orthostatic hypotension.  Patient had NSTEMI in 2016 and found to have distal LAD lesion that is not amenable to PCI, he also had moderate proximal to mid LAD disease, medical therapy was recommended.  He was previously followed by Dr. Wynonia Lawman and maintained on Plavix but not on aspirin.  In order to decrease his bleeding risk, attempt at [facility] procedure was made in the past that ultimately was unsuccessful.  He was felt not to be a candidate for anticoagulation therapy due to GU bleeding from bladder cancer.   He was seen by Dr. Debara Pickett in March 2021 for lightheadedness.  Prior to the visit, he had a decreased appetite and weight loss after bladder surgery.  Blood pressure at the time was 90/60.  EKG showed a sinus bradycardia with heart rate 55 bpm.  He returned to see me in June 2021 for dizziness upon standing.  His systolic blood pressure was still in the 90s.  I encouraged him to liberalizing salt intake and fluid intake.  He was still on Imdur at the time, and we discontinued the medication.  I decided to leave him on the metoprolol 25 mg twice a day due to fear of recurrence of A. fib with decreased dose.  He was on Eliquis 5 mg twice a day however was not sure who prescribed it to him.  His hematuria has improved.  He also reported he had a nephrectomy.  Since he had a 99% distal LAD subtotal occlusion on the previous cardiac catheterization, I expect he will have a fixed  scar near the apex of the heart, I want to make sure he does not have any reversible blockage from the proximal to mid LAD territory.  Patient presents today for cardiology office visit.  He has occasional chest discomfort however chest discomfort does not have clear correlation with the degree of exertion.  He has no lower extremity edema, orthopnea or PND.  I recommend a Myoview as initial work-up.  Although coronary CT would give better result, however his creatinine was 1.77, therefore risk of contrast nephropathy would be very high.   Past Medical History:  Diagnosis Date  . Acute gout of left ankle    06-14-2015  . Bladder cancer (HCC)    BCG's tx's  . BPH (benign prostatic hypertrophy)   . CAD (coronary artery disease) CARDIOLOGIST-  DR TILLEY   a. NSTEMI 12/2014 -  99% dLAD-2 (not amenable to PCI), 50% dLAD-1, 60% mLAD. Medical therapy was recommended.  . Cough    HARSH NONPRODUCTIVE COUGH 1 AND 1/2 WEEKS  . GERD (gastroesophageal reflux disease)    takes  OTC periodically  . Gilbert's syndrome   . H/O cardiac catheterization    a. Note: Difficult radial access in 12/2014 - recommend femoral approach if cath needed in the future.  Marland Kitchen History of kidney stones   . History of non-ST elevation myocardial infarction (  NSTEMI)    12-12-2014   CARDIAC CATH W/ NO INTERVENTION X 2FEW DAYS APART  . History of spinal fracture 2002   LUMBAR  . Hyperlipidemia   . Hypertension   . Hypothyroidism   . Myocardial infarction ([place])   . OSA on CPAP    SET ON 7 USES SOME NIGHTS  . Paroxysmal A-fib [facility])    cardiologist-  dr Wynonia Lawman  . Prostate cancer ([place]) 2019   last PSA  2.9  (montiored by urologist  dr Junious Silk) [place] FEB 2019 RAD DONE  . Shingles    2 weeks ago  . Wears glasses     Past Surgical History:  Procedure Laterality Date  . BACK SURGERY  2014   LOWER l 1, 2 4 AND 5  . BILATERAL INGUINAL HERNIA REPAIR    . CARDIAC CATHETERIZATION N/A 12/13/2014   Procedure: Left Heart  Cath and Coronary Angiography;  Surgeon: Leonie Man, MD;  Location: [place] [facility];  Service: Cardiovascular;  Laterality: N/A;  culprit lesion diat LAD-2  99%, not approachable via PCTA  due to extremely tortuous up stream LAD/  mLAD 60% and dLAD-1 50%, both are at the extremely tortuous segment and not PCI targets/  otherwise normal coronary arteries  . COLONOSCOPY WITH PROPOFOL N/A 01/16/2019   Procedure: COLONOSCOPY WITH PROPOFOL;  Surgeon: Clarene Essex, MD;  [facility]: [facility];  Service: Endoscopy;  Laterality: N/A;  . CYSTOSCOPY W/ RETROGRADES Left 08/22/2012   Procedure: CYSTOSCOPY WITH RETROGRADE PYELOGRAM;  left kidney washings;  Surgeon: Fredricka Bonine, MD;  Location: [facility];  Service: Urology;  Laterality: Left;  . CYSTOSCOPY W/ RETROGRADES N/A 07/08/2015   Procedure: CYSTOSCOPY WITH RETROGRADE PYELOGRAM;  Surgeon: [name], MD;  Location: [facility];  Service: Urology;  Laterality: N/A;  . CYSTOSCOPY W/ URETERAL STENT PLACEMENT Left 03/24/2019   Procedure: CYSTOSCOPY WITH RETROGRADE PYELOGRAM/URETERAL STENT PLACEMENT ureteroscopy biopsy of neoplasm;  Surgeon: [name], MD;  Location: [facility];  Service: Urology;  Laterality: Left;  . CYSTOSCOPY W/ URETERAL STENT PLACEMENT Left 04/08/2019   Procedure: CYSTOSCOPY cystogram clot evacuation left ureteroscopy clot evacuation left stent exchange liimited transuretheral resectio of prastate and fulguration  bleeders of prostate;  Surgeon: [name], MD;  [facility];  Service: Urology;  Laterality: Left;  . CYSTOSCOPY WITH BIOPSY  08/22/2012   Procedure: CYSTOSCOPY WITH BIOPSY;  Surgeon: Fredricka Bonine, MD;  Location[facility];  Service: Urology;;  . [name] WITH BIOPSY N/A 11/08/2014   Procedure: CYSTOSCOPY/BIOPSPY BLADDER INSTILLATION OF MARCAINE AND PYRIDIUM;  Surgeon: [name], MD;  Location: [facility];  Service:  Urology;  Laterality: N/A;  . CYSTOSCOPY WITH BIOPSY N/A 07/08/2015   Procedure: CYSTOSCOPY WITH BLADDER BIOPSY, FULGURATION, RETROGRADE PYLOGRAM AND RENAL WASHING;  Surgeon: [name], MD;  Location: [facility];  Service: Urology;  Laterality: N/A;  . CYSTOSCOPY WITH RETROGRADE PYELOGRAM, URETEROSCOPY AND STENT PLACEMENT Bilateral 07/30/2014   Procedure: CYSTOSCOPY WITH BLADDER BX FULERGATION AND BILATERAL RETROGRADE PYELOGRAM,;  Surgeon: [name], MD;  [facility];  Service: Urology;  Laterality: Bilateral;  . CYSTOSCOPY WITH STENT PLACEMENT Left 03/24/2018   Procedure: STENT PLACEMENT AND BIOPSY;  Surgeon: [name], MD;  Location: [facility];  Service: Urology;  Laterality: Left;  . CYSTOSCOPY/RETROGRADE/URETEROSCOPY Bilateral 08/17/2013   Procedure: CYSTOSCOPY, BLADDER BIOPSY WITH BILATERAL RETROGRADE PYELOGRAM, LEFT URETEROSCOPY AND DILATION OF STRICTURE ;  Surgeon: [name], MD;  Location: [facility];  Service: Urology;  Laterality: Bilateral;  . CYSTOSCOPY/RETROGRADE/URETEROSCOPY Left 03/24/2018   Procedure: CYSTOSCOPY/RETROGRADE/URETEROSCOPY;  Surgeon: [name], MD;  Location: [facility];  Service: Urology;  Laterality: Left;  . HOT HEMOSTASIS N/A 01/16/2019   Procedure: HOT HEMOSTASIS (ARGON PLASMA COAGULATION/BICAP);  Surgeon: Clarene Essex, MD;  Location: [facility];  Service: Endoscopy;  Laterality: N/A;  . LEFT ATRIAL APPENDAGE OCCLUSION N/A 01/09/2015   Procedure: LEFT ATRIAL APPENDAGE OCCLUSION;  Surgeon: [name], MD;  Location: [facility] [facility];  Service: Cardiovascular;  Laterality: N/A;  . LUMBAR LAMINECTOMY/DECOMPRESSION MICRODISCECTOMY Left 12/03/2013   Procedure: Left Lumbar three-four diskectomy with Lumbar four laminectomy ;  Surgeon: Newman Pies, MD;  Location: [facility];  Service: Neurosurgery;  Laterality: Left;  Left Lumbar three-four diskectomy with Lumbar  four laminectomy   . LYMPH NODE DISSECTION Bilateral 04/13/2019   Procedure: LYMPH NODE DISSECTION;  Surgeon: Alexis Frock, MD;  [facility]: [facility];  Service: Urology;  Laterality: Bilateral;  . ORIF LEFT ANKLE FX  2005  . POLYPECTOMY  01/16/2019   Procedure: POLYPECTOMY;  Surgeon: Clarene Essex, MD;  [facility];  Service: Endoscopy;;  . PROSTATE BIOPSY N/A 08/22/2012   Procedure: BIOPSY TRANSRECTAL ULTRASONIC PROSTATE (TUBP);  Surgeon: Fredricka Bonine, MD;  Location: [facility];  Service: Urology;  Laterality: N/A;  . ROBOT ASSITED LAPAROSCOPIC NEPHROURETERECTOMY Left 04/13/2019   Procedure: XI ROBOT ASSITED LAPAROSCOPIC NEPHROURETERECTOMY;  Surgeon: Alexis Frock, MD;  [facility];  Service: Urology;  Laterality: Left;  3 HRS  . TEE WITHOUT CARDIOVERSION N/A 12/31/2014   Procedure: TRANSESOPHAGEAL ECHOCARDIOGRAM (TEE);  Surgeon: Pixie Casino, MD;  Location: [facility];  Service: Cardiovascular;  Laterality: N/A;  ef 55-60%/  mild MR, TR, and PR/  mild LAE and RAE  . TRANSURETHRAL RESECTION OF BLADDER TUMOR  10/22/2011   Procedure: TRANSURETHRAL RESECTION OF BLADDER TUMOR (TURBT);  Surgeon: Fredricka Bonine, MD;  Location: [facility];  Service: Urology;  Laterality: N/A;  TURBT, LEFT URETEROSCOPY, POSSIBLE URETERAL STENT   . URETEROSCOPY  10/22/2011   Procedure: URETEROSCOPY;  Surgeon: Fredricka Bonine, MD;  Location: [facility];  Service: Urology;  Laterality: Left;  . WISDOM TOOTH EXTRACTION      Current Medications: Current Meds  Medication Sig  . allopurinol (ZYLOPRIM) 300 MG tablet Take 300 mg by mouth every morning.   Marland Kitchen aspirin EC 81 MG tablet Take 81 mg by mouth daily.  Marland Kitchen doxycycline (MONODOX) 50 MG capsule Take 50 mg by mouth as needed.  Marland Kitchen ELIQUIS 5 MG TABS tablet TAKE 1 TABLET BY MOUTH TWO TIMES A DAY  . EPINEPHrine 0.3 mg/0.3 mL IJ SOAJ injection Inject 0.3 mLs (0.3 mg total) into the muscle  once. (Patient taking differently: Inject 0.3 mg into the muscle daily as needed for anaphylaxis. )  . levothyroxine (SYNTHROID, LEVOTHROID) 50 MCG tablet Take 50 mcg by mouth daily before breakfast.  . nitroGLYCERIN (NITROSTAT) 0.4 MG SL tablet Place 1 tablet (0.4 mg total) under the tongue every 5 (five) minutes x 3 doses as needed for chest pain.  . pravastatin (PRAVACHOL) 40 MG tablet Take 1 tablet (40 mg total) by mouth daily.  . Testosterone 1.62 % GEL Apply 2 Pump topically every other day.   . [DISCONTINUED] doxycycline (MONODOX) 50 MG capsule Take 50 mg by mouth daily.     Allergies:   Bee venom, Losartan, and Oxycodone   Social History   Socioeconomic History  . Marital  status: Married    Spouse name: Not on file  . Number of children: Not on file  . Years of education: Not on file  . Highest education level: Not on file  Occupational History  . Occupation: Retired    Fish farm manager: OTHER  Tobacco Use  . Smoking status: Never Smoker  . Smokeless tobacco: Never Used  Vaping Use  . Vaping Use: Never used  Substance and Sexual Activity  . Alcohol use: Yes    Alcohol/week: 7.0 standard drinks    Types: 7 Glasses of wine per week    Comment: red wine glass daily  . Drug use: No  . Sexual activity: Yes  Other Topics Concern  . Not on file  Social History Narrative  . Not on file   Social Determinants of Health   Financial Resource Strain:   . Difficulty of Paying Living Expenses: Not on file  Food Insecurity:   . Worried About Charity fundraiser in the Last Year: Not on file  . Ran Out of Food in the Last Year: Not on file  Transportation Needs:   . Lack of Transportation (Medical): Not on file  . Lack of Transportation (Non-Medical): Not on file  Physical Activity:   . Days of Exercise per Week: Not on file  . Minutes of Exercise per Session: Not on file  Stress:   . Feeling of Stress : Not on file  Social Connections:   . Frequency of Communication with Friends  and Family: Not on file  . Frequency of Social Gatherings with Friends and Family: Not on file  . Attends Religious Services: Not on file  . Active Member of Clubs or Organizations: Not on file  . Attends Archivist Meetings: Not on file  . Marital Status: Not on file     Family History: The patient's family history includes Cancer in his mother and sister; Heart attack in his father; Leukemia in his mother. There is no history of Hypertension or Stroke.  ROS:   Please see the history of present illness.     All other systems reviewed and are negative.  EKGs/Labs/Other Studies Reviewed:    The following studies were reviewed today:  Cath 12/13/2014  Clearly the culprit lesion is the Dist LAD-2 lesion, 99% stenosed. Not approachable via percutaneous approach - due to extremely tortuous up stream LAD  Mid LAD lesion, 60% stenosed. Dist LAD-1 lesion, 50% stenosed. -- Both are at the extremely tortuous segment, and not excellent PCI targets, especially the second lesion  Otherwise essentially angiographic normal coronary arteries  Difficult radial access due to tortuous innominate artery and brachial/radial spasm.  Normal LVEDP - unable to perform LV Gram.    Clearly the patient's culprit lesion was the distal LAD lesion that is not approachable from PTCA standpoint. The moderate lesions in the mid vessel also not excellent PCI targets, and do not look flow-limiting.  Recommendation based on evaluation of images with myself and interventional cardiology peers is to proceed with medical management of small non-ST elevation MI/troponin leak with continued therapeutic doses of enoxaparin. I have also written for Plavix that could be considered to continue for at least a month given the ACS presentation.  If he were to fail medical management, we could consider potentially evaluation and treatment of the more proximal of the mid LAD lesions. However I would recommend femoral  approach based on the difficulty with catheter placement and concern for spasm from a radial approach.  The patient will return to his nursing unit for ongoing care. I have written for treatment dose enoxaparin to be dosed tonight at 8 PM which would be 6 hours posterior band removal.   EKG:  EKG is not ordered today.   Recent Labs: 03/18/2019: ALT 20 10/23/2019: BUN 23; Creatinine, Ser 1.77; Hemoglobin 10.7; Platelets 334; Potassium 4.1; Sodium 138  Recent Lipid Panel    Component Value Date/Time   CHOL 195 10/10/2018 1141   TRIG 136 10/10/2018 1141   HDL 42 10/10/2018 1141   CHOLHDL 4.6 10/10/2018 1141   CHOLHDL 4.4 12/12/2014 1721   VLDL 18 12/12/2014 1721   LDLCALC 129 (H) 10/10/2018 1141    Physical Exam:    VS:  BP 120/75   Pulse 76   Ht 5\' 7"  (1.702 m)   Wt 201 lb 9.6 oz (91.4 kg)   SpO2 97%   BMI 31.58 kg/m     Wt Readings from Last 3 Encounters:  10/26/19 201 lb 9.6 oz (91.4 kg)  08/21/19 200 lb 12.8 oz (91.1 kg)  07/18/19 199 lb (90.3 kg)     GEN:  Well nourished, well developed in no acute distress HEENT: Normal NECK: No JVD; No carotid bruits LYMPHATICS: No lymphadenopathy CARDIAC: RRR, no murmurs, rubs, gallops RESPIRATORY:  Clear to auscultation without rales, wheezing or rhonchi  ABDOMEN: Soft, non-tender, non-distended MUSCULOSKELETAL:  No edema; No deformity  SKIN: Warm and dry NEUROLOGIC:  Alert and oriented x 3 PSYCHIATRIC:  Normal affect   ASSESSMENT:    1. Coronary artery disease involving native coronary artery of native heart with angina pectoris (HCC)   2. Chest pain, unspecified type   3. PAF (paroxysmal atrial fibrillation) ([place])   4. Hyperlipidemia LDL goal <70   5. Hypothyroidism, unspecified type   6. Orthostatic hypotension    PLAN:    In order of problems listed above:  1. Chest pain: We will avoid a coronary CTA due to creatinine of 1.7.  Proceed with Myoview  2. CAD: Last cardiac catheterization 2016.  Cath report above.   I expect him to have a scar near the apex of the heart since he had 99% subtotal occlusion on last cardiac catheterization, I will make sure he does not have any reversible blockage from the proximal to mid LAD territory  3. PAF: On Eliquis and metoprolol   4. Hyperlipidemia: On pravastatin  5. Hypothyroidism: Managed by primary care provider  6. History of orthostatic hypotension: Symptom improved after discontinuation of Imdur.   Medication Adjustments/Labs and Tests Ordered: Current medicines are reviewed at length with the patient today.  Concerns regarding medicines are outlined above.  Orders Placed This Encounter  Procedures  . MYOCARDIAL PERFUSION IMAGING   No orders of the defined types were placed in this encounter.   Patient Instructions  Medication Instructions:  Your physician recommends that you continue on your current medications as directed. Please refer to the Current Medication list given to you today.  *If you need a refill on your cardiac medications before your next appointment, please call your pharmacy*  Lab Work: NONE ordered at this time of appointment   If you have labs (blood work) drawn today and your tests are completely normal, you will receive your results only by: Marland Kitchen MyChart Message (if you have MyChart) OR . A paper copy in the mail If you have any lab test that is abnormal or we need to change your treatment, we will call you to review the results.  Testing/Procedures: Your physician has requested that you have a lexiscan myoview. For further information please visit [email or web address]. Please follow instruction sheet, as given.   Please schedule for 1-2 weeks   Follow-Up: At [facility], you and your health needs are our priority.  As part of our continuing mission to provide you with exceptional heart care, we have created designated Provider Care Teams.  These Care Teams include your primary Cardiologist (physician) and Advanced  Practice Providers (APPs -  Physician Assistants and Nurse Practitioners) who all work together to provide you with the care you need, when you need it.  Your next appointment:   1 month(s)  The format for your next appointment:   In Person  Provider:   Almyra Deforest, PA-C  Other Instructions      Signed, Almyra Deforest, [place]  10/28/2019 11:28 PM    [place]

## 2019-10-26 NOTE — Patient Instructions (Signed)
Medication Instructions:  Your physician recommends that you continue on your current medications as directed. Please refer to the Current Medication list given to you today.  *If you need a refill on your cardiac medications before your next appointment, please call your pharmacy*  Lab Work: NONE ordered at this time of appointment   If you have labs (blood work) drawn today and your tests are completely normal, you will receive your results only by: Marland Kitchen MyChart Message (if you have MyChart) OR . A paper copy in the mail If you have any lab test that is abnormal or we need to change your treatment, we will call you to review the results.  Testing/Procedures: Your physician has requested that you have a lexiscan myoview. For further information please visit [email or web address]. Please follow instruction sheet, as given.   Please schedule for 1-2 weeks   Follow-Up: At [facility], you and your health needs are our priority.  As part of our continuing mission to provide you with exceptional heart care, we have created designated Provider Care Teams.  These Care Teams include your primary Cardiologist (physician) and Advanced Practice Providers (APPs -  Physician Assistants and Nurse Practitioners) who all work together to provide you with the care you need, when you need it.  Your next appointment:   1 month(s)  The format for your next appointment:   In Person  Provider:   Almyra Deforest, PA-C  Other Instructions

## 2019-10-28 ENCOUNTER — Encounter: Payer: Self-pay | Admitting: Physician Assistant

## 2019-10-30 DIAGNOSIS — C778 Secondary and unspecified malignant neoplasm of lymph nodes of multiple regions: Secondary | ICD-10-CM | POA: Diagnosis not present

## 2019-10-30 DIAGNOSIS — C674 Malignant neoplasm of posterior wall of bladder: Secondary | ICD-10-CM | POA: Diagnosis not present

## 2019-10-30 DIAGNOSIS — C652 Malignant neoplasm of left renal pelvis: Secondary | ICD-10-CM | POA: Diagnosis not present

## 2019-10-30 DIAGNOSIS — C61 Malignant neoplasm of prostate: Secondary | ICD-10-CM | POA: Diagnosis not present

## 2019-11-08 DIAGNOSIS — M109 Gout, unspecified: Secondary | ICD-10-CM | POA: Diagnosis not present

## 2019-11-08 DIAGNOSIS — R7309 Other abnormal glucose: Secondary | ICD-10-CM | POA: Diagnosis not present

## 2019-11-08 DIAGNOSIS — E559 Vitamin D deficiency, unspecified: Secondary | ICD-10-CM | POA: Diagnosis not present

## 2019-11-08 DIAGNOSIS — E039 Hypothyroidism, unspecified: Secondary | ICD-10-CM | POA: Diagnosis not present

## 2019-11-08 DIAGNOSIS — I10 Essential (primary) hypertension: Secondary | ICD-10-CM | POA: Diagnosis not present

## 2019-11-08 DIAGNOSIS — M1712 Unilateral primary osteoarthritis, left knee: Secondary | ICD-10-CM | POA: Diagnosis not present

## 2019-11-08 DIAGNOSIS — I700 Atherosclerosis of aorta: Secondary | ICD-10-CM | POA: Diagnosis not present

## 2019-11-08 DIAGNOSIS — Z23 Encounter for immunization: Secondary | ICD-10-CM | POA: Diagnosis not present

## 2019-11-08 DIAGNOSIS — N2889 Other specified disorders of kidney and ureter: Secondary | ICD-10-CM | POA: Diagnosis not present

## 2019-11-08 DIAGNOSIS — E7800 Pure hypercholesterolemia, unspecified: Secondary | ICD-10-CM | POA: Diagnosis not present

## 2019-11-08 DIAGNOSIS — I480 Paroxysmal atrial fibrillation: Secondary | ICD-10-CM | POA: Diagnosis not present

## 2019-11-08 DIAGNOSIS — I25119 Atherosclerotic heart disease of native coronary artery with unspecified angina pectoris: Secondary | ICD-10-CM | POA: Diagnosis not present

## 2019-11-09 ENCOUNTER — Ambulatory Visit (HOSPITAL_COMMUNITY)
Admission: RE | Admit: 2019-11-09 | Discharge: 2019-11-09 | Disposition: A | Discharge status: Home / Self Care | Payer: PPO | Source: Ambulatory Visit | Attending: Cardiovascular Disease | Admitting: Cardiovascular Disease

## 2019-11-09 ENCOUNTER — Other Ambulatory Visit: Payer: Self-pay

## 2019-11-09 DIAGNOSIS — R079 Chest pain, unspecified: Secondary | ICD-10-CM | POA: Diagnosis not present

## 2019-11-09 LAB — MYOCARDIAL PERFUSION IMAGING
LV dias vol: 93 mL (ref 62–150)
LV sys vol: 43 mL
Peak HR: 90 {beats}/min
Rest HR: 67 {beats}/min
SDS: 2
SRS: 3
SSS: 5
TID: 1.03

## 2019-11-09 MED ORDER — TECHNETIUM TC 99M TETROFOSMIN IV KIT
10.5000 | PACK | Freq: Once | INTRAVENOUS | Status: AC | PRN
  Administered 2019-11-09: 10.5 via INTRAVENOUS
  Filled 2019-11-09: qty 11

## 2019-11-09 MED ORDER — TECHNETIUM TC 99M TETROFOSMIN IV KIT
32.2000 | PACK | Freq: Once | INTRAVENOUS | Status: AC | PRN
  Administered 2019-11-09: 32.2 via INTRAVENOUS
  Filled 2019-11-09: qty 33

## 2019-11-09 MED ORDER — REGADENOSON 0.4 MG/5ML IV SOLN
0.4000 mg | Freq: Once | INTRAVENOUS | Status: AC
  Administered 2019-11-09: 0.4 mg via INTRAVENOUS

## 2019-11-09 NOTE — Progress Notes (Signed)
Reassuring myoview, normal pumping function of heart, no significant reversible blockage seen

## 2019-11-22 ENCOUNTER — Encounter: Payer: Self-pay | Admitting: Physician Assistant

## 2019-11-22 ENCOUNTER — Ambulatory Visit: Payer: PPO | Admitting: Physician Assistant

## 2019-11-22 VITALS — BP 108/74 | HR 41 | Ht 67.0 in | Wt 199.8 lb

## 2019-11-22 DIAGNOSIS — I2510 Atherosclerotic heart disease of native coronary artery without angina pectoris: Secondary | ICD-10-CM | POA: Diagnosis not present

## 2019-11-22 DIAGNOSIS — J849 Interstitial pulmonary disease, unspecified: Secondary | ICD-10-CM

## 2019-11-22 DIAGNOSIS — R079 Chest pain, unspecified: Secondary | ICD-10-CM

## 2019-11-22 DIAGNOSIS — I480 Paroxysmal atrial fibrillation: Secondary | ICD-10-CM | POA: Diagnosis not present

## 2019-11-22 DIAGNOSIS — N183 Chronic kidney disease, stage 3 unspecified: Secondary | ICD-10-CM

## 2019-11-22 DIAGNOSIS — E785 Hyperlipidemia, unspecified: Secondary | ICD-10-CM | POA: Diagnosis not present

## 2019-11-22 DIAGNOSIS — I493 Ventricular premature depolarization: Secondary | ICD-10-CM

## 2019-11-22 MED ORDER — PANTOPRAZOLE SODIUM 40 MG PO TBEC: 40.0000 mg | DELAYED_RELEASE_TABLET | Freq: Every day | ORAL | 3 refills | Status: DC

## 2019-11-22 NOTE — Patient Instructions (Signed)
Medication Instructions:   START Protonix 40 mg daily  *If you need a refill on your cardiac medications before your next appointment, please call your pharmacy*  Lab Work: NONE ordered at this time of appointment   If you have labs (blood work) drawn today and your tests are completely normal, you will receive your results only by: Marland Kitchen MyChart Message (if you have MyChart) OR . A paper copy in the mail If you have any lab test that is abnormal or we need to change your treatment, we will call you to review the results.  Testing/Procedures: Non-Cardiac High resoultion CT scanning, (CAT scanning), is a noninvasive, special x-ray that produces cross-sectional images of the body using x-rays and a computer. CT scans help physicians diagnose and treat medical conditions. For some CT exams, a contrast material is used to enhance visibility in the area of the body being studied. CT scans provide greater clarity and reveal more details than regular x-ray exams.   Follow-Up: At [facility], you and your health needs are our priority.  As part of our continuing mission to provide you with exceptional heart care, we have created designated Provider Care Teams.  These Care Teams include your primary Cardiologist (physician) and Advanced Practice Providers (APPs -  Physician Assistants and Nurse Practitioners) who all work together to provide you with the care you need, when you need it.  Your next appointment:   2 month(s)  The format for your next appointment:   In Person  Provider:   K. Mali Hilty, MD  Other Instructions

## 2019-11-22 NOTE — Progress Notes (Signed)
Cardiology Office Note:    Date:  11/25/2019   ID:  Charles Wright, DOB [birth year], MRN [id number]  PCP:  Maury Dus, MD  [facility] Cardiologist:  Pixie Casino, MD  [place] Electrophysiologist:  None   Referring MD: Maury Dus, MD   Chief Complaint  Patient presents with  . Follow-up    seen for Dr. Debara Pickett    History of Present Illness:    Charles Wright is a 84 y.o. male with a hx of CAD, GERD,Gilbert'ssyndrome, hyperlipidemia, hypothyroidism, PAF,OSA, history of bladder cancer, prostate cancer and orthostatic hypotension. Patient had NSTEMI in 2016 and found to have subtotal 99% distal LAD lesion that is not amenable to PCI, he also had moderate proximal to mid LAD disease, medical therapy was recommended. He was previously followed by Dr. Wynonia Lawman and maintained on Plavix but not on aspirin. In order to decrease his bleeding risk, attempt at [facility] procedure was made in the past that ultimately was unsuccessful. He was felt not to be a candidate for anticoagulation therapy due to GU bleeding from bladder cancer.   He was seen by Dr. Debara Pickett in March 2021 for lightheadedness.  Prior to the visit, he had a decreased appetite and weight loss after bladder surgery.  Blood pressure at the time was 90/60.  EKG showed a sinus bradycardia with heart rate 55 bpm.  He returned to see me in June 2021 for dizziness upon standing.  His systolic blood pressure was still in the 90s.  I encouraged him to liberalizing salt intake and fluid intake.  He was still on Imdur at the time, and we discontinued the medication.  I decided to leave him on the metoprolol 25 mg twice a day due to fear of recurrence of A. fib with decreased dose.  He was on Eliquis 5 mg twice a day however was not sure who prescribed it to him.  His hematuria has improved.  He also reported he had a nephrectomy.  Since he had a 99% distal LAD subtotal occlusion on the previous cardiac catheterization, I expect he will  have a fixed scar near the apex of the heart, I want to make sure he does not have any reversible blockage from the proximal to mid LAD territory.  I last saw the patient on 10/26/2019, he was having intermittent chest discomfort.  Although coronary CT would give him better result, however his creatinine was 1.77 at the time, therefore risk of contrast nephropathy would be very high.  I eventually recommended a Myoview.  Myoview performed on 11/09/2019 showed EF 54%, small defect of moderate severity present in the apical inferior and apex location, overall this is a low risk study. The location of the apex fixed defect is consistent with the previous occluded distal LAD.  Patient presents today for follow-up. He continues to have intermittent burning sensation in the chest, this is not associated with the degree of exertion. He is able to walk over a mile per day without any issue. I recommended a course of Protonix 40 mg daily. On exam, he also noted to have very fine crackles in the base of the lung. Previous CT of abdomen suggested possible interstitial lung disease, he was not aware of this. I recommended dedicated high-resolution CT without contrast for further assessment. If this does confirm interstitial lung disease, I plan to refer the patient to pulmonology service.   Past Medical History:  Diagnosis Date  . Acute gout of left ankle  06-14-2015  . Bladder cancer (HCC)    BCG's tx's  . BPH (benign prostatic hypertrophy)   . CAD (coronary artery disease) CARDIOLOGIST-  DR TILLEY   a. NSTEMI 12/2014 -  99% dLAD-2 (not amenable to PCI), 50% dLAD-1, 60% mLAD. Medical therapy was recommended.  . Cough    HARSH NONPRODUCTIVE COUGH 1 AND 1/2 WEEKS  . GERD (gastroesophageal reflux disease)    takes  OTC periodically  . Gilbert's syndrome   . H/O cardiac catheterization    a. Note: Difficult radial access in 12/2014 - recommend femoral approach if cath needed in the future.  Marland Kitchen History of  kidney stones   . History of non-ST elevation myocardial infarction (NSTEMI)    12-12-2014   CARDIAC CATH W/ NO INTERVENTION X 2FEW DAYS APART  . History of spinal fracture 2002   LUMBAR  . Hyperlipidemia   . Hypertension   . Hypothyroidism   . Myocardial infarction ([place])   . OSA on CPAP    SET ON 7 USES SOME NIGHTS  . Paroxysmal A-fib [facility])    cardiologist-  dr Wynonia Lawman  . Prostate cancer ([place]) 2019   last PSA  2.9  (montiored by urologist  dr Junious Silk) [place] FEB 2019 RAD DONE  . Shingles    2 weeks ago  . Wears glasses     Past Surgical History:  Procedure Laterality Date  . BACK SURGERY  2014   LOWER l 1, 2 4 AND 5  . BILATERAL INGUINAL HERNIA REPAIR    . CARDIAC CATHETERIZATION N/A 12/13/2014   Procedure: Left Heart Cath and Coronary Angiography;  Surgeon: Leonie Man, MD;  Location: [place] [facility];  Service: Cardiovascular;  Laterality: N/A;  culprit lesion diat LAD-2  99%, not approachable via PCTA  due to extremely tortuous up stream LAD/  mLAD 60% and dLAD-1 50%, both are at the extremely tortuous segment and not PCI targets/  otherwise normal coronary arteries  . COLONOSCOPY WITH PROPOFOL N/A 01/16/2019   Procedure: COLONOSCOPY WITH PROPOFOL;  Surgeon: Clarene Essex, MD;  [facility]: [facility];  Service: Endoscopy;  Laterality: N/A;  . CYSTOSCOPY W/ RETROGRADES Left 08/22/2012   Procedure: CYSTOSCOPY WITH RETROGRADE PYELOGRAM;  left kidney washings;  Surgeon: Fredricka Bonine, MD;  Location: [facility];  Service: Urology;  Laterality: Left;  . CYSTOSCOPY W/ RETROGRADES N/A 07/08/2015   Procedure: CYSTOSCOPY WITH RETROGRADE PYELOGRAM;  Surgeon: [name], MD;  Location: [facility];  Service: Urology;  Laterality: N/A;  . CYSTOSCOPY W/ URETERAL STENT PLACEMENT Left 03/24/2019   Procedure: CYSTOSCOPY WITH RETROGRADE PYELOGRAM/URETERAL STENT PLACEMENT ureteroscopy biopsy of neoplasm;  Surgeon: [name], MD;  [facility];  Service: Urology;  Laterality: Left;  . CYSTOSCOPY W/ URETERAL STENT PLACEMENT Left 04/08/2019   Procedure: CYSTOSCOPY cystogram clot evacuation left ureteroscopy clot evacuation left stent exchange liimited transuretheral resectio of prastate and fulguration  bleeders of prostate;  Surgeon: [name], MD;  [facility];  Service: Urology;  Laterality: Left;  . CYSTOSCOPY WITH BIOPSY  08/22/2012   Procedure: CYSTOSCOPY WITH BIOPSY;  Surgeon: Fredricka Bonine, MD;  Location: [facility];  Service: Urology;;  . [name] WITH BIOPSY N/A 11/08/2014   Procedure: CYSTOSCOPY/BIOPSPY BLADDER INSTILLATION OF MARCAINE AND PYRIDIUM;  Surgeon: [name], MD;  Location: [facility];  Service: Urology;  Laterality: N/A;  . CYSTOSCOPY WITH BIOPSY N/A 07/08/2015   Procedure: CYSTOSCOPY WITH BLADDER BIOPSY, FULGURATION, RETROGRADE PYLOGRAM AND RENAL  WASHING;  Surgeon: [name], MD;  Location: [facility];  Service: Urology;  Laterality: N/A;  . CYSTOSCOPY WITH RETROGRADE PYELOGRAM, URETEROSCOPY AND STENT PLACEMENT Bilateral 07/30/2014   Procedure: CYSTOSCOPY WITH BLADDER BX FULERGATION AND BILATERAL RETROGRADE PYELOGRAM,;  Surgeon: [name], MD;  [facility];  Service: Urology;  Laterality: Bilateral;  . CYSTOSCOPY WITH STENT PLACEMENT Left 03/24/2018   Procedure: STENT PLACEMENT AND BIOPSY;  Surgeon: [name], MD;  Location: [facility];  Service: Urology;  Laterality: Left;  . CYSTOSCOPY/RETROGRADE/URETEROSCOPY Bilateral 08/17/2013   Procedure: CYSTOSCOPY, BLADDER BIOPSY WITH BILATERAL RETROGRADE PYELOGRAM, LEFT URETEROSCOPY AND DILATION OF STRICTURE ;  Surgeon: [name], MD;  Location: [facility];  Service: Urology;  Laterality: Bilateral;  . CYSTOSCOPY/RETROGRADE/URETEROSCOPY Left 03/24/2018   Procedure: CYSTOSCOPY/RETROGRADE/URETEROSCOPY;  Surgeon: [name], MD;   Location[facility];  Service: Urology;  Laterality: Left;  . HOT HEMOSTASIS N/A 01/16/2019   Procedure: HOT HEMOSTASIS (ARGON PLASMA COAGULATION/BICAP);  Surgeon: Clarene Essex, MD;  Location: [facility];  Service: Endoscopy;  Laterality: N/A;  . LEFT ATRIAL APPENDAGE OCCLUSION N/A 01/09/2015   Procedure: LEFT ATRIAL APPENDAGE OCCLUSION;  Surgeon: [name], MD;  Location: [place] [facility];  Service: Cardiovascular;  Laterality: N/A;  . LUMBAR LAMINECTOMY/DECOMPRESSION MICRODISCECTOMY Left 12/03/2013   Procedure: Left Lumbar three-four diskectomy with Lumbar four laminectomy ;  Surgeon: Newman Pies, MD;  Location: [place] [facility];  Service: Neurosurgery;  Laterality: Left;  Left Lumbar three-four diskectomy with Lumbar four laminectomy   . LYMPH NODE DISSECTION Bilateral 04/13/2019   Procedure: LYMPH NODE DISSECTION;  Surgeon: Alexis Frock, MD;  [facility];  Service: Urology;  Laterality: Bilateral;  . ORIF LEFT ANKLE FX  2005  . POLYPECTOMY  01/16/2019   Procedure: POLYPECTOMY;  Surgeon: Clarene Essex, MD;  [facility];  Service: Endoscopy;;  . PROSTATE BIOPSY N/A 08/22/2012   Procedure: BIOPSY TRANSRECTAL ULTRASONIC PROSTATE (TUBP);  Surgeon: Fredricka Bonine, MD;  Location: [facility];  Service: Urology;  Laterality: N/A;  . ROBOT ASSITED LAPAROSCOPIC NEPHROURETERECTOMY Left 04/13/2019   Procedure: XI ROBOT ASSITED LAPAROSCOPIC NEPHROURETERECTOMY;  Surgeon: Alexis Frock, MD;  [facility];  Service: Urology;  Laterality: Left;  3 HRS  . TEE WITHOUT CARDIOVERSION N/A 12/31/2014   Procedure: TRANSESOPHAGEAL ECHOCARDIOGRAM (TEE);  Surgeon: Pixie Casino, MD;  Location: [facility];  Service: Cardiovascular;  Laterality: N/A;  ef 55-60%/  mild MR, TR, and PR/  mild LAE and RAE  . TRANSURETHRAL RESECTION OF BLADDER TUMOR  10/22/2011   Procedure: TRANSURETHRAL RESECTION OF BLADDER TUMOR (TURBT);  Surgeon: Fredricka Bonine, MD;   Location: [facility];  Service: Urology;  Laterality: N/A;  TURBT, LEFT URETEROSCOPY, POSSIBLE URETERAL STENT   . URETEROSCOPY  10/22/2011   Procedure: URETEROSCOPY;  Surgeon: Fredricka Bonine, MD;  [facility];  Service: Urology;  Laterality: Left;  . WISDOM TOOTH EXTRACTION      Current Medications: Current Meds  Medication Sig  . allopurinol (ZYLOPRIM) 300 MG tablet Take 300 mg by mouth every morning.   Marland Kitchen aspirin EC 81 MG tablet Take 81 mg by mouth daily.  Marland Kitchen doxycycline (MONODOX) 50 MG capsule Take 50 mg by mouth as needed.  Marland Kitchen ELIQUIS 5 MG TABS tablet TAKE 1 TABLET BY MOUTH TWO TIMES A DAY  . EPINEPHrine 0.3 mg/0.3 mL IJ SOAJ injection Inject 0.3 mLs (0.3 mg total) into the muscle once. (Patient taking differently: Inject 0.3 mg into  the muscle daily as needed for anaphylaxis. )  . levothyroxine (SYNTHROID, LEVOTHROID) 50 MCG tablet Take 50 mcg by mouth daily before breakfast.  . metoprolol tartrate (LOPRESSOR) 25 MG tablet Take 1 tablet (25 mg total) by mouth 2 (two) times daily.  . nitroGLYCERIN (NITROSTAT) 0.4 MG SL tablet Place 1 tablet (0.4 mg total) under the tongue every 5 (five) minutes x 3 doses as needed for chest pain.  . pravastatin (PRAVACHOL) 40 MG tablet Take 1 tablet (40 mg total) by mouth daily.  . Testosterone 1.62 % GEL Apply 2 Pump topically every other day.      Allergies:   Bee venom, Losartan, and Oxycodone   Social History   Socioeconomic History  . Marital status: Married    Spouse name: Not on file  . Number of children: Not on file  . Years of education: Not on file  . Highest education level: Not on file  Occupational History  . Occupation: Retired    Fish farm manager: OTHER  Tobacco Use  . Smoking status: Never Smoker  . Smokeless tobacco: Never Used  Vaping Use  . Vaping Use: Never used  Substance and Sexual Activity  . Alcohol use: Yes    Alcohol/week: 7.0 standard drinks    Types: 7 Glasses of wine  per week    Comment: red wine glass daily  . Drug use: No  . Sexual activity: Yes  Other Topics Concern  . Not on file  Social History Narrative  . Not on file   Social Determinants of Health   Financial Resource Strain:   . Difficulty of Paying Living Expenses: Not on file  Food Insecurity:   . Worried About Charity fundraiser in the Last Year: Not on file  . Ran Out of Food in the Last Year: Not on file  Transportation Needs:   . Lack of Transportation (Medical): Not on file  . Lack of Transportation (Non-Medical): Not on file  Physical Activity:   . Days of Exercise per Week: Not on file  . Minutes of Exercise per Session: Not on file  Stress:   . Feeling of Stress : Not on file  Social Connections:   . Frequency of Communication with Friends and Family: Not on file  . Frequency of Social Gatherings with Friends and Family: Not on file  . Attends Religious Services: Not on file  . Active Member of Clubs or Organizations: Not on file  . Attends Archivist Meetings: Not on file  . Marital Status: Not on file     Family History: The patient's family history includes Cancer in his mother and sister; Heart attack in his father; Leukemia in his mother. There is no history of Hypertension or Stroke.  ROS:   Please see the history of present illness.     All other systems reviewed and are negative.  EKGs/Labs/Other Studies Reviewed:    The following studies were reviewed today:  Myoview 11/09/2019  Nuclear stress EF: 54%.  The left ventricular ejection fraction is mildly decreased (45-54%).  There was no ST segment deviation noted during stress.  Defect 1: There is a small defect of moderate severity present in the apical inferior and apex location.  This is a low risk study.   Mildly abnormal, low risk stress nuclear study with apical thinning vs small prior infarct; no ischemia; EF 54 with normal wall motion.    EKG:  EKG is ordered today.  The ekg  ordered today demonstrates  normal sinus rhythm, single PVC, no significant ST-T wave changes.  Minimal J-point elevation in lead II, however not in the adjacent lead.  When compared to the previous EKG obtained on 10/23/2019, there has been no significant change.  Recent Labs: 03/18/2019: ALT 20 10/23/2019: BUN 23; Creatinine, Ser 1.77; Hemoglobin 10.7; Platelets 334; Potassium 4.1; Sodium 138  Recent Lipid Panel    Component Value Date/Time   CHOL 195 10/10/2018 1141   TRIG 136 10/10/2018 1141   HDL 42 10/10/2018 1141   CHOLHDL 4.6 10/10/2018 1141   CHOLHDL 4.4 12/12/2014 1721   VLDL 18 12/12/2014 1721   LDLCALC 129 (H) 10/10/2018 1141     Risk Assessment/Calculations:     CHA2DS2-VASc Score = 3  This indicates a 3.2% annual risk of stroke. The patient's score is based upon: CHF History: 0 HTN History: 0 Diabetes History: 0 Stroke History: 0 Vascular Disease History: 1 Age Score: 2 Gender Score: 0      Physical Exam:    VS:  BP 108/74   Pulse (!) 41   Ht 5\' 7"  (1.702 m)   Wt 199 lb 12.8 oz (90.6 kg)   SpO2 98%   BMI 31.29 kg/m     Wt Readings from Last 3 Encounters:  11/22/19 199 lb 12.8 oz (90.6 kg)  11/09/19 201 lb (91.2 kg)  10/26/19 201 lb 9.6 oz (91.4 kg)     GEN:  Well nourished, well developed in no acute distress HEENT: Normal NECK: No JVD; No carotid bruits LYMPHATICS: No lymphadenopathy CARDIAC: RRR, no murmurs, rubs, gallops RESPIRATORY:  Clear to auscultation without rales, wheezing or rhonchi  ABDOMEN: Soft, non-tender, non-distended MUSCULOSKELETAL:  No edema; No deformity  SKIN: Warm and dry NEUROLOGIC:  Alert and oriented x 3 PSYCHIATRIC:  Normal affect   ASSESSMENT:    1. Chest pain of uncertain etiology   2. Interstitial pulmonary disease ([place])   3. Coronary artery disease involving native coronary artery of native heart without angina pectoris   4. PAF (paroxysmal atrial fibrillation) ([place])   5. Hyperlipidemia LDL goal <70   6.  Stage 3 chronic kidney disease, unspecified whether stage 3a or 3b CKD ([place])    PLAN:    In order of problems listed above:  1. Chest pain: Recently underwent a Myoview on 11/09/2019 that revealed normal EF with apical defect consistent with known 99% distal LAD subtotal occlusion. I recommended a trial of Protonix to see if this will control his chest pain.  2. Interstitial lung disease: He has very fine crackles in bilateral bases of the lung. I suspect he has interstitial lung disease. I am concerned of contrast dye use with his poor renal function. I spoke with CT tech at [facility] who recommended high resolution CT without contrast.  3. CAD: See #1, recently has been having some chest discomfort that is not associated with physical activity. Myoview showed apical defect consistent with known 99% distal LAD lesion.  4. PAF: On Eliquis and metoprolol  5. Hyperlipidemia: On pravastatin  6. CKD stage III: Baseline creatinine around 1.7.   Medication Adjustments/Labs and Tests Ordered: Current medicines are reviewed at length with the patient today.  Concerns regarding medicines are outlined above.  Orders Placed This Encounter  Procedures  . CT CHEST HIGH RESOLUTION  . EKG 12-Lead   Meds ordered this encounter  Medications  . pantoprazole (PROTONIX) 40 MG tablet    Sig: Take 1 tablet (40 mg total) by mouth daily.  Dispense:  90 tablet    Refill:  3    Patient Instructions  Medication Instructions:   START Protonix 40 mg daily  *If you need a refill on your cardiac medications before your next appointment, please call your pharmacy*  Lab Work: NONE ordered at this time of appointment   If you have labs (blood work) drawn today and your tests are completely normal, you will receive your results only by: Marland Kitchen MyChart Message (if you have MyChart) OR . A paper copy in the mail If you have any lab test that is abnormal or we need to change your treatment, we will  call you to review the results.  Testing/Procedures: Non-Cardiac High resoultion CT scanning, (CAT scanning), is a noninvasive, special x-ray that produces cross-sectional images of the body using x-rays and a computer. CT scans help physicians diagnose and treat medical conditions. For some CT exams, a contrast material is used to enhance visibility in the area of the body being studied. CT scans provide greater clarity and reveal more details than regular x-ray exams.   Follow-Up: At [facility], you and your health needs are our priority.  As part of our continuing mission to provide you with exceptional heart care, we have created designated Provider Care Teams.  These Care Teams include your primary Cardiologist (physician) and Advanced Practice Providers (APPs -  Physician Assistants and Nurse Practitioners) who all work together to provide you with the care you need, when you need it.  Your next appointment:   2 month(s)  The format for your next appointment:   In Person  Provider:   Raliegh Ip Mali Hilty, MD  Other Instructions      Signed, Almyra Deforest, [place]  11/25/2019 12:07 AM    [place]

## 2019-11-25 ENCOUNTER — Encounter: Payer: Self-pay | Admitting: Physician Assistant

## 2019-12-07 ENCOUNTER — Other Ambulatory Visit: Payer: Self-pay

## 2019-12-07 ENCOUNTER — Ambulatory Visit
Admission: RE | Admit: 2019-12-07 | Discharge: 2019-12-07 | Disposition: A | Discharge status: Home / Self Care | Payer: PPO | Source: Ambulatory Visit | Attending: Physician Assistant | Admitting: Physician Assistant

## 2019-12-07 DIAGNOSIS — J849 Interstitial pulmonary disease, unspecified: Secondary | ICD-10-CM

## 2019-12-07 DIAGNOSIS — R059 Cough, unspecified: Secondary | ICD-10-CM | POA: Diagnosis not present

## 2019-12-10 ENCOUNTER — Other Ambulatory Visit: Payer: Self-pay

## 2019-12-10 DIAGNOSIS — H903 Sensorineural hearing loss, bilateral: Secondary | ICD-10-CM | POA: Diagnosis not present

## 2019-12-10 DIAGNOSIS — J849 Interstitial pulmonary disease, unspecified: Secondary | ICD-10-CM

## 2019-12-10 NOTE — Telephone Encounter (Signed)
This encounter was created in error - please disregard.

## 2020-01-14 ENCOUNTER — Encounter: Payer: Self-pay | Admitting: Pulmonary Disease

## 2020-01-14 ENCOUNTER — Ambulatory Visit: Payer: PPO | Admitting: Pulmonary Disease

## 2020-01-14 ENCOUNTER — Other Ambulatory Visit: Payer: Self-pay

## 2020-01-14 VITALS — BP 110/68 | HR 40 | Temp 97.7°F | Ht 67.0 in | Wt 202.8 lb

## 2020-01-14 DIAGNOSIS — J849 Interstitial pulmonary disease, unspecified: Secondary | ICD-10-CM | POA: Diagnosis not present

## 2020-01-14 NOTE — Patient Instructions (Signed)
We will get some labs today to further evaluate any autoimmune conditions that may cause the scarring  We will give you a questionnaire to check for exposures  Schedule pulmonary function test for better evaluation of the lung  Follow-up in clinic in 1 to 2 months to discuss results and plan for next steps.

## 2020-01-14 NOTE — Progress Notes (Addendum)
Charles Wright    [id number]    [birth year]  Primary Care Physician:Reade, Herbie [place], MD  Referring Physician: Almyra Deforest, [address],  [place] [address]  Chief complaint: Consult for ILD  HPI: 84 year old with history of prostate, bladder, renal cancer, atrial fibrillation, coronary artery disease, sleep apnea Has complains of chronic cough, nonproductive in nature for the past 1 year.  Not associated with dyspnea or congestion He has been tried on Protonix for empiric treatment of GERD with no improvement  He had a high-res CT recently which showed progressive probable UIP pattern pulmonary fibrosis and has been referred to clinic for further evaluation  Pets: Dogs, cats.  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  No asbestos, mold, hot tub, Jacuzzi or down pillows, comforters. ILD questionnaire 01/14/20-negative except as above. Smoking history: Occasionally smokes cigarettes and pipe in college Travel history: No significant travel Relevant family history: No significant family history of lung disease.  Outpatient Encounter Medications as of 01/14/2020  Medication Sig  . allopurinol (ZYLOPRIM) 300 MG tablet Take 300 mg by mouth every morning.   Marland Kitchen aspirin EC 81 MG tablet Take 81 mg by mouth daily.  Marland Kitchen doxycycline (MONODOX) 50 MG capsule Take 50 mg by mouth as needed.  Marland Kitchen ELIQUIS 5 MG TABS tablet TAKE 1 TABLET BY MOUTH TWO TIMES A DAY  . EPINEPHrine 0.3 mg/0.3 mL IJ SOAJ injection Inject 0.3 mLs (0.3 mg total) into the muscle once. (Patient taking differently: Inject 0.3 mg into the muscle daily as needed for anaphylaxis. )  . levothyroxine (SYNTHROID, LEVOTHROID) 50 MCG tablet Take 50 mcg by mouth daily before breakfast.  . metoprolol tartrate (LOPRESSOR) 25 MG tablet Take 1 tablet (25 mg total) by mouth 2 (two) times daily.  .  nitroGLYCERIN (NITROSTAT) 0.4 MG SL tablet Place 1 tablet (0.4 mg total) under the tongue every 5 (five) minutes x 3 doses as needed for chest pain.  . pantoprazole (PROTONIX) 40 MG tablet Take 1 tablet (40 mg total) by mouth daily.  . pravastatin (PRAVACHOL) 40 MG tablet Take 1 tablet (40 mg total) by mouth daily.  . Testosterone 1.62 % GEL Apply 2 Pump topically every other day.    No facility-administered encounter medications on file as of 01/14/2020.    Allergies as of 01/14/2020 - Review Complete 01/14/2020  Allergen Reaction Noted  . Bee venom Anaphylaxis 08/22/2012  . Losartan Other (See Comments) 07/22/2014  . Oxycodone Other (See Comments) 10/19/2011    Past Medical History:  Diagnosis Date  . Acute gout of left ankle    06-14-2015  . Bladder cancer (HCC)    BCG's tx's  . BPH (benign prostatic hypertrophy)   . CAD (coronary artery disease) CARDIOLOGIST-  DR TILLEY   a. NSTEMI 12/2014 -  99% dLAD-2 (not amenable to PCI), 50% dLAD-1, 60% mLAD. Medical therapy was recommended.  . Cough    HARSH NONPRODUCTIVE COUGH 1 AND 1/2 WEEKS  . GERD (gastroesophageal reflux disease)    takes  OTC periodically  . Gilbert's syndrome   . H/O cardiac catheterization    a. Note: Difficult radial access in 12/2014 - recommend femoral approach if cath needed in the future.  Marland Kitchen History of kidney stones   . History of non-ST elevation myocardial infarction (NSTEMI)    12-12-2014   CARDIAC CATH  W/ NO INTERVENTION X 2FEW DAYS APART  . History of spinal fracture 2002   LUMBAR  . Hyperlipidemia   . Hypertension   . Hypothyroidism   . Myocardial infarction ([place])   . OSA on CPAP    SET ON 7 USES SOME NIGHTS  . Paroxysmal A-fib [facility])    cardiologist-  dr Wynonia Lawman  . Prostate cancer ([place]) 2019   last PSA  2.9  (montiored by urologist  dr Junious Silk) [place] FEB 2019 RAD DONE  . Shingles    2 weeks ago  . Wears glasses     Past Surgical History:  Procedure Laterality Date  . BACK SURGERY   2014   LOWER l 1, 2 4 AND 5  . BILATERAL INGUINAL HERNIA REPAIR    . CARDIAC CATHETERIZATION N/A 12/13/2014   Procedure: Left Heart Cath and Coronary Angiography;  Surgeon: Leonie Man, MD;  Location: [place] [facility];  Service: Cardiovascular;  Laterality: N/A;  culprit lesion diat LAD-2  99%, not approachable via PCTA  due to extremely tortuous up stream LAD/  mLAD 60% and dLAD-1 50%, both are at the extremely tortuous segment and not PCI targets/  otherwise normal coronary arteries  . COLONOSCOPY WITH PROPOFOL N/A 01/16/2019   Procedure: COLONOSCOPY WITH PROPOFOL;  Surgeon: Clarene Essex, MD;  Location: [facility];  Service: Endoscopy;  Laterality: N/A;  . CYSTOSCOPY W/ RETROGRADES Left 08/22/2012   Procedure: CYSTOSCOPY WITH RETROGRADE PYELOGRAM;  left kidney washings;  Surgeon: Fredricka Bonine, MD;  Location: [facility];  Service: Urology;  Laterality: Left;  . CYSTOSCOPY W/ RETROGRADES N/A 07/08/2015   Procedure: CYSTOSCOPY WITH RETROGRADE PYELOGRAM;  Surgeon: [name], MD;  Location[facility];  Service: Urology;  Laterality: N/A;  . CYSTOSCOPY W/ URETERAL STENT PLACEMENT Left 03/24/2019   Procedure: CYSTOSCOPY WITH RETROGRADE PYELOGRAM/URETERAL STENT PLACEMENT ureteroscopy biopsy of neoplasm;  Surgeon: [name], MD;  Location: [facility];  Service: Urology;  Laterality: Left;  . CYSTOSCOPY W/ URETERAL STENT PLACEMENT Left 04/08/2019   Procedure: CYSTOSCOPY cystogram clot evacuation left ureteroscopy clot evacuation left stent exchange liimited transuretheral resectio of prastate and fulguration  bleeders of prostate;  Surgeon: [name], MD;  Location: [facility];  Service: Urology;  Laterality: Left;  . CYSTOSCOPY WITH BIOPSY  08/22/2012   Procedure: CYSTOSCOPY WITH BIOPSY;  Surgeon: Fredricka Bonine, MD;  Location: [facility];  Service: Urology;;  . [name] WITH BIOPSY N/A 11/08/2014   Procedure:  CYSTOSCOPY/BIOPSPY BLADDER INSTILLATION OF MARCAINE AND PYRIDIUM;  Surgeon: [name], MD;  Location: [facility];  Service: Urology;  Laterality: N/A;  . CYSTOSCOPY WITH BIOPSY N/A 07/08/2015   Procedure: CYSTOSCOPY WITH BLADDER BIOPSY, FULGURATION, RETROGRADE PYLOGRAM AND RENAL WASHING;  Surgeon: [name], MD;  Location: [facility];  Service: Urology;  Laterality: N/A;  . CYSTOSCOPY WITH RETROGRADE PYELOGRAM, URETEROSCOPY AND STENT PLACEMENT Bilateral 07/30/2014   Procedure: CYSTOSCOPY WITH BLADDER BX FULERGATION AND BILATERAL RETROGRADE PYELOGRAM,;  Surgeon: [name], MD;  Location: [facility];  Service: Urology;  Laterality: Bilateral;  . CYSTOSCOPY WITH STENT PLACEMENT Left 03/24/2018   Procedure: STENT PLACEMENT AND BIOPSY;  Surgeon: [name], MD;  Location: [facility];  Service: Urology;  Laterality: Left;  . CYSTOSCOPY/RETROGRADE/URETEROSCOPY Bilateral 08/17/2013   Procedure: CYSTOSCOPY, BLADDER BIOPSY WITH BILATERAL RETROGRADE PYELOGRAM, LEFT URETEROSCOPY AND DILATION OF STRICTURE ;  Surgeon: [name], MD;  Location: [facility];  Service: Urology;  Laterality: Bilateral;  .  CYSTOSCOPY/RETROGRADE/URETEROSCOPY Left 03/24/2018   Procedure: CYSTOSCOPY/RETROGRADE/URETEROSCOPY;  Surgeon: [name], MD;  Location: [facility];  Service: Urology;  Laterality: Left;  . HOT HEMOSTASIS N/A 01/16/2019   Procedure: HOT HEMOSTASIS (ARGON PLASMA COAGULATION/BICAP);  Surgeon: Clarene Essex, MD;  Location: [facility];  Service: Endoscopy;  Laterality: N/A;  . LEFT ATRIAL APPENDAGE OCCLUSION N/A 01/09/2015   Procedure: LEFT ATRIAL APPENDAGE OCCLUSION;  Surgeon: [name], MD;  Location: [place] [facility];  Service: Cardiovascular;  Laterality: N/A;  . LUMBAR LAMINECTOMY/DECOMPRESSION MICRODISCECTOMY Left 12/03/2013   Procedure: Left Lumbar three-four diskectomy with Lumbar four laminectomy ;   Surgeon: Newman Pies, MD;  Location: [facility];  Service: Neurosurgery;  Laterality: Left;  Left Lumbar three-four diskectomy with Lumbar four laminectomy   . LYMPH NODE DISSECTION Bilateral 04/13/2019   Procedure: LYMPH NODE DISSECTION;  Surgeon: Alexis Frock, MD;  Location: [facility];  Service: Urology;  Laterality: Bilateral;  . ORIF LEFT ANKLE FX  2005  . POLYPECTOMY  01/16/2019   Procedure: POLYPECTOMY;  Surgeon: Clarene Essex, MD;  Location: [facility];  Service: Endoscopy;;  . PROSTATE BIOPSY N/A 08/22/2012   Procedure: BIOPSY TRANSRECTAL ULTRASONIC PROSTATE (TUBP);  Surgeon: Fredricka Bonine, MD;  Location: [facility];  Service: Urology;  Laterality: N/A;  . ROBOT ASSITED LAPAROSCOPIC NEPHROURETERECTOMY Left 04/13/2019   Procedure: XI ROBOT ASSITED LAPAROSCOPIC NEPHROURETERECTOMY;  Surgeon: Alexis Frock, MD;  Location: [facility];  Service: Urology;  Laterality: Left;  3 HRS  . TEE WITHOUT CARDIOVERSION N/A 12/31/2014   Procedure: TRANSESOPHAGEAL ECHOCARDIOGRAM (TEE);  Surgeon: Pixie Casino, MD;  [facility];  Service: Cardiovascular;  Laterality: N/A;  ef 55-60%/  mild MR, TR, and PR/  mild LAE and RAE  . TRANSURETHRAL RESECTION OF BLADDER TUMOR  10/22/2011   Procedure: TRANSURETHRAL RESECTION OF BLADDER TUMOR (TURBT);  Surgeon: Fredricka Bonine, MD;  Location: [facility];  Service: Urology;  Laterality: N/A;  TURBT, LEFT URETEROSCOPY, POSSIBLE URETERAL STENT   . URETEROSCOPY  10/22/2011   Procedure: URETEROSCOPY;  Surgeon: Fredricka Bonine, MD;  Location: [facility];  Service: Urology;  Laterality: Left;  . WISDOM TOOTH EXTRACTION      Family History  Problem Relation Age of Onset  . Leukemia Mother   . Cancer Mother        leukemia  . Heart attack Father   . Cancer Sister        breast  . Hypertension Neg Hx   . Stroke Neg Hx     Social History   Socioeconomic History  . Marital status:  Married    Spouse name: Not on file  . Number of children: Not on file  . Years of education: Not on file  . Highest education level: Not on file  Occupational History  . Occupation: Retired    Fish farm manager: OTHER  Tobacco Use  . Smoking status: Never Smoker  . Smokeless tobacco: Never Used  Vaping Use  . Vaping Use: Never used  Substance and Sexual Activity  . Alcohol use: Yes    Alcohol/week: 7.0 standard drinks    Types: 7 Glasses of wine per week    Comment: red wine glass daily  . Drug use: No  . Sexual activity: Yes  Other Topics Concern  . Not on file  Social History Narrative  . Not on file   Social Determinants of Health   Financial Resource Strain:   . Difficulty of Paying Living Expenses: Not on  file  Food Insecurity:   . Worried About Charity fundraiser in the Last Year: Not on file  . Ran Out of Food in the Last Year: Not on file  Transportation Needs:   . Lack of Transportation (Medical): Not on file  . Lack of Transportation (Non-Medical): Not on file  Physical Activity:   . Days of Exercise per Week: Not on file  . Minutes of Exercise per Session: Not on file  Stress:   . Feeling of Stress : Not on file  Social Connections:   . Frequency of Communication with Friends and Family: Not on file  . Frequency of Social Gatherings with Friends and Family: Not on file  . Attends Religious Services: Not on file  . Active Member of Clubs or Organizations: Not on file  . Attends Archivist Meetings: Not on file  . Marital Status: Not on file  Intimate Partner Violence:   . Fear of Current or Ex-Partner: Not on file  . Emotionally Abused: Not on file  . Physically Abused: Not on file  . Sexually Abused: Not on file    Review of systems: Review of Systems  Constitutional: Negative for fever and chills.  HENT: Negative.   Eyes: Negative for blurred vision.  Respiratory: as per HPI  Cardiovascular: Negative for chest pain and palpitations.   Gastrointestinal: Negative for vomiting, diarrhea, blood per rectum. Genitourinary: Negative for dysuria, urgency, frequency and hematuria.  Musculoskeletal: Negative for myalgias, back pain and joint pain.  Skin: Negative for itching and rash.  Neurological: Negative for dizziness, tremors, focal weakness, seizures and loss of consciousness.  Endo/Heme/Allergies: Negative for environmental allergies.  Psychiatric/Behavioral: Negative for depression, suicidal ideas and hallucinations.  All other systems reviewed and are negative.  Physical Exam: Blood pressure 110/68, pulse (!) 40, temperature 97.7 F (36.5 C), temperature source Temporal, height 5\' 7"  (1.702 m), weight 202 lb 12.8 oz (92 kg), SpO2 95 %. Gen:      No acute distress HEENT:  EOMI, sclera anicteric Neck:     No masses; no thyromegaly Lungs:    Clear to auscultation bilaterally; normal respiratory effort CV:         Regular rate and rhythm; no murmurs Abd:      + bowel sounds; soft, non-tender; no palpable masses, no distension Ext:    No edema; adequate peripheral perfusion Skin:      Warm and dry; no rash Neuro: alert and oriented x 3 Psych: normal mood and affect  Data Reviewed: Imaging: CT abdomen pelvis 02/21/2018-mild interstitial changes at the base High-res CT 12/07/2019-left upper lobe pulmonary nodule measuring 5 mm, basilar predominant fibrosis and probable UIP pattern.  Progressive compared to prior scans I have reviewed the images personally.  PFTs: 06/18/2015 FVC 3.94 (6%), FEV1 3.11 (18%), F/F 79, TLC 6.46 [90%], DLCO 24.74 [85%] Normal test  Labs:   Assessment:  ILD Review of CT scan shows probable UIP pattern fibrosis.  He has minimal changes at the base dating back to at least 2012 on CTs of the abdomen.  It is more clearly progressive since his last CT abdomen in 2020.  This is suggestive of IPF in the absence of any exposures or CTD symptoms  We will get baseline CTD labs, PFTs and ILD  questionnaire.  Return to clinic in 1 month for review and plan for next steps  Lung nodule 3 months follow-up CT ordered  Plan/Recommendations: CTD serologies, ILD questionnaire, PFTs Follow-up CT chest without contrast  Marshell Garfinkel MD [facility] 01/14/2020, 9:10 AM  CC: Almyra Deforest, [place]

## 2020-01-17 ENCOUNTER — Ambulatory Visit: Payer: PPO | Admitting: Internal Medicine

## 2020-01-17 ENCOUNTER — Other Ambulatory Visit: Payer: Self-pay

## 2020-01-17 ENCOUNTER — Encounter: Payer: Self-pay | Admitting: Internal Medicine

## 2020-01-17 VITALS — BP 114/70 | HR 78 | Ht 67.0 in | Wt 204.4 lb

## 2020-01-17 DIAGNOSIS — I2510 Atherosclerotic heart disease of native coronary artery without angina pectoris: Secondary | ICD-10-CM

## 2020-01-17 DIAGNOSIS — R079 Chest pain, unspecified: Secondary | ICD-10-CM

## 2020-01-17 DIAGNOSIS — K439 Ventral hernia without obstruction or gangrene: Secondary | ICD-10-CM | POA: Diagnosis not present

## 2020-01-17 DIAGNOSIS — C652 Malignant neoplasm of left renal pelvis: Secondary | ICD-10-CM | POA: Diagnosis not present

## 2020-01-17 DIAGNOSIS — Z905 Acquired absence of kidney: Secondary | ICD-10-CM | POA: Diagnosis not present

## 2020-01-17 DIAGNOSIS — J849 Interstitial pulmonary disease, unspecified: Secondary | ICD-10-CM

## 2020-01-17 DIAGNOSIS — C674 Malignant neoplasm of posterior wall of bladder: Secondary | ICD-10-CM | POA: Diagnosis not present

## 2020-01-17 DIAGNOSIS — C778 Secondary and unspecified malignant neoplasm of lymph nodes of multiple regions: Secondary | ICD-10-CM | POA: Diagnosis not present

## 2020-01-17 LAB — ANTI-SCLERODERMA ANTIBODY: Scleroderma (Scl-70) (ENA) Antibody, IgG: <1 AI

## 2020-01-17 LAB — ANTI-NUCLEAR AB-TITER (ANA TITER): ANA Titer 1: 1:80 {titer} — ABNORMAL HIGH

## 2020-01-17 LAB — ANA: Anti Nuclear Antibody (ANA): POSITIVE — AB

## 2020-01-17 LAB — SJOGRENS SYNDROME-B EXTRACTABLE NUCLEAR ANTIBODY: SSB (La) (ENA) Antibody, IgG: <1 AI

## 2020-01-17 LAB — ANCA SCREEN W REFLEX TITER
ANCA Screen: POSITIVE — AB
Atypical P-ANCA titer: 1:80 {titer} — ABNORMAL HIGH

## 2020-01-17 LAB — SJOGRENS SYNDROME-A EXTRACTABLE NUCLEAR ANTIBODY: SSA (Ro) (ENA) Antibody, IgG: <1 AI

## 2020-01-17 NOTE — Progress Notes (Signed)
OFFICE NOTE  Chief Complaint:  Follow-up chest pain  Primary Care Physician: Maury Dus, MD  HPI:  Charles Wright is a 84 y.o. male with a past medial history significant for coronary artery disease with non-STEMI in 2016.  He was found to have distal LAD disease not amenable to PCI as well as moderate mid to proximal LAD disease.  Medical therapy was recommended.  He also has a history of bladder cancer, prostate cancer, hyperlipidemia, hypertension and OSA on CPAP.  He has been followed by Dr. Wynonia Lawman.  He maintains on clopidogrel but not aspirin.  Recently he has been having issues with ureteral stricture and is recommended to have a cystoscopy with Dr. Junious Silk.  He would require clearance including holding clopidogrel prior to that procedure.  Today in the office he is coughing significantly.  He apparently had upper respiratory infection and then also had concomitant rash which was painful and vesicular and diagnosed as shingles by a dermatologist that he is friends with.  Besides his shortness of breath related upper respiratory infection as well as chest wall pain from shingles, he denies any chest pain concerning for angina or recent worsening shortness of breath.  10/10/2018  Charles Wright returns today for follow-up of coronary disease and atrial fibrillation.  He is a former patient of Dr. Wynonia Lawman however Dr. Wynonia Lawman is now retired.  When we last spoke he received preoperative clearance for urologic procedure.  He does have a history of a bladder cancer and hematuria.  Interestingly, based on further chart review, he had had a prior watchman procedure for his A. fib attempt at stroke reduction.  At that time because of bleeding he was not felt to be a candidate for anticoagulation for his A. fib.  He had been on amiodarone.  He was then referred for watchman procedure which shows ultimately unsuccessful.  I was involved in the imaging component of that procedure.  Subsequently it was recommended  he go on aspirin and Plavix since he was not a candidate for anticoagulation due to GU bleeding from bladder cancer.  That has however been successfully treated and has had no further bleeding issues.  He also just had recent ureteral stenting which seems to have been successful.  When reviewing his chart today however it it notes that he is only on clopidogrel monotherapy.  Have an STEMI in 2016 and was found to have significant coronary disease however not amenable to intervention.  He is done well with that without any further chest pain however did have significant fatigue recently.  It turned out this was related to low testosterone and he is now on testosterone replacement.  Likely a side effect of antiandrogen therapy.  Recent labs show total cholesterol 158, HDL 50 LDL, 86 and triglycerides 115.  05/02/2019  Charles Wright returns today for follow-up. He is complaining of some issues with lightheadedness. He recently underwent surgery for what was suspicious for a left upper tract urothelial carcinoma. This was complicated by pain and bladder spasms requiring multiple ER visits. He has had a decreased appetite and weight loss (5 pounds of which was his left kidney/ureter. Additionally, he is down a total of 20 pounds since I last saw him. This is pertinent because his blood pressure today was 90/60. In general it has run higher than this. I suspect it may be a reason why feels lightheaded. His EKG shows a sinus bradycardia at 55. Possible medications that could causes include his isosorbide and metoprolol  which she takes 50 mg twice daily.  08/21/2019  Charles Wright seen today in follow-up.  Overall he continues to do well.  He says since he had his nephrectomies had no further bleeding issues.  He denies any chest pain or worsening shortness of breath.  Recently had some orthostatic hypotension with low blood pressure noted to be in the 83M systolic.  He was taken off of his Imdur and his beta-blocker was  decreased.  Blood pressure today was 116/72 and his mention he feels well.  01/17/2020  Charles Wright is seen today for follow-up.  Over the summer he had chest pain and was seen by Almyra Deforest, PA-C.  Stress test was performed which showed small fixed apical defect without any ischemia, consistent with known distal LAD severe stenosis.  This vessel was thought to be too small to intervene on.  No other reversible ischemia was appreciated.  He noted that his chest pain was not improved with nitroglycerin.  During his exam he was also found to have bibasilar dry crackles consistent with possible interstitial lung disease.  High-resolution CT scan was performed which did corroborate this.  He has subsequently been referred to pulmonary and saw Dr. Vaughan Browner, who feels he may have IPF.  He has pending pulmonary function tests and follow-up with him in January.  He reports he still gets some intermittent chest discomfort in the left upper chest.  PMHx:  Past Medical History:  Diagnosis Date  . Acute gout of left ankle    06-14-2015  . Bladder cancer (HCC)    BCG's tx's  . BPH (benign prostatic hypertrophy)   . CAD (coronary artery disease) CARDIOLOGIST-  DR TILLEY   a. NSTEMI 12/2014 -  99% dLAD-2 (not amenable to PCI), 50% dLAD-1, 60% mLAD. Medical therapy was recommended.  . Cough    HARSH NONPRODUCTIVE COUGH 1 AND 1/2 WEEKS  . GERD (gastroesophageal reflux disease)    takes  OTC periodically  . Gilbert's syndrome   . H/O cardiac catheterization    a. Note: Difficult radial access in 12/2014 - recommend femoral approach if cath needed in the future.  Marland Kitchen History of kidney stones   . History of non-ST elevation myocardial infarction (NSTEMI)    12-12-2014   CARDIAC CATH W/ NO INTERVENTION X 2FEW DAYS APART  . History of spinal fracture 2002   LUMBAR  . Hyperlipidemia   . Hypertension   . Hypothyroidism   . Myocardial infarction ([place])   . OSA on CPAP    SET ON 7 USES SOME NIGHTS  . Paroxysmal A-fib  [facility])    cardiologist-  dr Wynonia Lawman  . Prostate cancer ([place]) 2019   last PSA  2.9  (montiored by urologist  dr Junious Silk) [place] FEB 2019 RAD DONE  . Shingles    2 weeks ago  . Wears glasses     Past Surgical History:  Procedure Laterality Date  . BACK SURGERY  2014   LOWER l 1, 2 4 AND 5  . BILATERAL INGUINAL HERNIA REPAIR    . CARDIAC CATHETERIZATION N/A 12/13/2014   Procedure: Left Heart Cath and Coronary Angiography;  Surgeon: Leonie Man, MD;  Location: [place] [facility];  Service: Cardiovascular;  Laterality: N/A;  culprit lesion diat LAD-2  99%, not approachable via PCTA  due to extremely tortuous up stream LAD/  mLAD 60% and dLAD-1 50%, both are at the extremely tortuous segment and not PCI targets/  otherwise normal coronary arteries  .  COLONOSCOPY WITH PROPOFOL N/A 01/16/2019   Procedure: COLONOSCOPY WITH PROPOFOL;  Surgeon: Clarene Essex, MD;  Location: [facility];  Service: Endoscopy;  Laterality: N/A;  . CYSTOSCOPY W/ RETROGRADES Left 08/22/2012   Procedure: CYSTOSCOPY WITH RETROGRADE PYELOGRAM;  left kidney washings;  Surgeon: Fredricka Bonine, MD;  Location: [facility];  Service: Urology;  Laterality: Left;  . CYSTOSCOPY W/ RETROGRADES N/A 07/08/2015   Procedure: CYSTOSCOPY WITH RETROGRADE PYELOGRAM;  Surgeon: [name], MD;  Location: [facility];  Service: Urology;  Laterality: N/A;  . CYSTOSCOPY W/ URETERAL STENT PLACEMENT Left 03/24/2019   Procedure: CYSTOSCOPY WITH RETROGRADE PYELOGRAM/URETERAL STENT PLACEMENT ureteroscopy biopsy of neoplasm;  Surgeon: [name], MD;  [facility];  Service: Urology;  Laterality: Left;  . CYSTOSCOPY W/ URETERAL STENT PLACEMENT Left 04/08/2019   Procedure: CYSTOSCOPY cystogram clot evacuation left ureteroscopy clot evacuation left stent exchange liimited transuretheral resectio of prastate and fulguration  bleeders of prostate;  Surgeon: [name], MD;  [facility];   Service: Urology;  Laterality: Left;  . CYSTOSCOPY WITH BIOPSY  08/22/2012   Procedure: CYSTOSCOPY WITH BIOPSY;  Surgeon: Fredricka Bonine, MD;  Location: [facility];  Service: Urology;;  . [name] WITH BIOPSY N/A 11/08/2014   Procedure: CYSTOSCOPY/BIOPSPY BLADDER INSTILLATION OF MARCAINE AND PYRIDIUM;  Surgeon: [name], MD;  Location: [facility];  Service: Urology;  Laterality: N/A;  . CYSTOSCOPY WITH BIOPSY N/A 07/08/2015   Procedure: CYSTOSCOPY WITH BLADDER BIOPSY, FULGURATION, RETROGRADE PYLOGRAM AND RENAL WASHING;  Surgeon: [name], MD;  Location: [facility];  Service: Urology;  Laterality: N/A;  . CYSTOSCOPY WITH RETROGRADE PYELOGRAM, URETEROSCOPY AND STENT PLACEMENT Bilateral 07/30/2014   Procedure: CYSTOSCOPY WITH BLADDER BX FULERGATION AND BILATERAL RETROGRADE PYELOGRAM,;  Surgeon: [name], MD;  [facility];  Service: Urology;  Laterality: Bilateral;  . CYSTOSCOPY WITH STENT PLACEMENT Left 03/24/2018   Procedure: STENT PLACEMENT AND BIOPSY;  Surgeon: [name], MD;  Location[facility];  Service: Urology;  Laterality: Left;  . CYSTOSCOPY/RETROGRADE/URETEROSCOPY Bilateral 08/17/2013   Procedure: CYSTOSCOPY, BLADDER BIOPSY WITH BILATERAL RETROGRADE PYELOGRAM, LEFT URETEROSCOPY AND DILATION OF STRICTURE ;  Surgeon: [name], MD;  Location: [facility];  Service: Urology;  Laterality: Bilateral;  . CYSTOSCOPY/RETROGRADE/URETEROSCOPY Left 03/24/2018   Procedure: CYSTOSCOPY/RETROGRADE/URETEROSCOPY;  Surgeon: [name], MD;  Location: [facility];  Service: Urology;  Laterality: Left;  . HOT HEMOSTASIS N/A 01/16/2019   Procedure: HOT HEMOSTASIS (ARGON PLASMA COAGULATION/BICAP);  Surgeon: Clarene Essex, MD;  Location: [facility];  Service: Endoscopy;  Laterality: N/A;  . LEFT ATRIAL APPENDAGE OCCLUSION N/A 01/09/2015   Procedure: LEFT ATRIAL  APPENDAGE OCCLUSION;  Surgeon: [name], MD;  Location: [place] [facility];  Service: Cardiovascular;  Laterality: N/A;  . LUMBAR LAMINECTOMY/DECOMPRESSION MICRODISCECTOMY Left 12/03/2013   Procedure: Left Lumbar three-four diskectomy with Lumbar four laminectomy ;  Surgeon: Newman Pies, MD;  Location: [place] [facility];  Service: Neurosurgery;  Laterality: Left;  Left Lumbar three-four diskectomy with Lumbar four laminectomy   . LYMPH NODE DISSECTION Bilateral 04/13/2019   Procedure: LYMPH NODE DISSECTION;  Surgeon: Alexis Frock, MD;  [facility];  Service: Urology;  Laterality: Bilateral;  . ORIF LEFT ANKLE FX  2005  . POLYPECTOMY  01/16/2019   Procedure: POLYPECTOMY;  Surgeon: Clarene Essex, MD;  [facility];  Service: Endoscopy;;  . PROSTATE BIOPSY N/A 08/22/2012   Procedure: BIOPSY TRANSRECTAL ULTRASONIC PROSTATE (TUBP);  Surgeon: Fredricka Bonine, MD;  Location: [place];  Service: Urology;  Laterality: N/A;  . ROBOT ASSITED LAPAROSCOPIC NEPHROURETERECTOMY Left 04/13/2019   Procedure: XI ROBOT ASSITED LAPAROSCOPIC NEPHROURETERECTOMY;  Surgeon: Alexis Frock, MD;  [facility];  Service: Urology;  Laterality: Left;  3 HRS  . TEE WITHOUT CARDIOVERSION N/A 12/31/2014   Procedure: TRANSESOPHAGEAL ECHOCARDIOGRAM (TEE);  Surgeon: Pixie Casino, MD;  Location: [facility];  Service: Cardiovascular;  Laterality: N/A;  ef 55-60%/  mild MR, TR, and PR/  mild LAE and RAE  . TRANSURETHRAL RESECTION OF BLADDER TUMOR  10/22/2011   Procedure: TRANSURETHRAL RESECTION OF BLADDER TUMOR (TURBT);  Surgeon: Fredricka Bonine, MD;  Location: [facility];  Service: Urology;  Laterality: N/A;  TURBT, LEFT URETEROSCOPY, POSSIBLE URETERAL STENT   . URETEROSCOPY  10/22/2011   Procedure: URETEROSCOPY;  Surgeon: Fredricka Bonine, MD;  Location: [facility];  Service: Urology;  Laterality: Left;  . WISDOM TOOTH EXTRACTION       FAMHx:  Family History  Problem Relation Age of Onset  . Leukemia Mother   . Cancer Mother        leukemia  . Heart attack Father   . Cancer Sister        breast  . Hypertension Neg Hx   . Stroke Neg Hx     SOCHx:   reports that he has never smoked. He has never used smokeless tobacco. He reports current alcohol use of about 7.0 standard drinks of alcohol per week. He reports that he does not use drugs.  ALLERGIES:  Allergies  Allergen Reactions  . Bee Venom Anaphylaxis    Actually Hornets and Wasps.   . Losartan Other (See Comments)    Cough   . Oxycodone Other (See Comments)    ALTERED MENTAL STATUS GETS MEAN    ROS: Pertinent items noted in HPI and remainder of comprehensive ROS otherwise negative.  HOME MEDS: Current Outpatient Medications on File Prior to Visit  Medication Sig Dispense Refill  . acetaminophen (TYLENOL) 325 MG tablet 2 tablets    . allopurinol (ZYLOPRIM) 300 MG tablet Take 300 mg by mouth every morning.     Marland Kitchen amiodarone (PACERONE) 200 MG tablet 1 tablet    . aspirin EC 81 MG tablet Take 81 mg by mouth daily.    . Cholecalciferol (VITAMIN D3) 125 MCG (5000 UT) TABS 1 tablet    . colchicine 0.6 MG tablet 2 tablets    . doxycycline (MONODOX) 50 MG capsule Take 50 mg by mouth as needed.    Marland Kitchen ELIQUIS 5 MG TABS tablet TAKE 1 TABLET BY MOUTH TWO TIMES A DAY 60 tablet 5  . EPINEPHrine 0.3 mg/0.3 mL IJ SOAJ injection Inject 0.3 mLs (0.3 mg total) into the muscle once. 2 Device 1  . levothyroxine (SYNTHROID, LEVOTHROID) 50 MCG tablet Take 50 mcg by mouth daily before breakfast.    . metoprolol tartrate (LOPRESSOR) 25 MG tablet Take 1 tablet (25 mg total) by mouth 2 (two) times daily. 180 tablet 3  . nitroGLYCERIN (NITROSTAT) 0.4 MG SL tablet Place 1 tablet (0.4 mg total) under the tongue every 5 (five) minutes x 3 doses as needed for chest pain. 25 tablet 12  . pravastatin (PRAVACHOL) 40 MG tablet Take 1 tablet (40 mg total) by mouth daily. 30 tablet    . Testosterone 1.62 % GEL Apply 2 Pump topically every other day.      No current facility-administered medications on file prior to visit.  LABS/IMAGING: No results found for this or any previous visit (from the past 48 hour(s)). No results found.  LIPID PANEL:    Component Value Date/Time   CHOL 195 10/10/2018 1141   TRIG 136 10/10/2018 1141   HDL 42 10/10/2018 1141   CHOLHDL 4.6 10/10/2018 1141   CHOLHDL 4.4 12/12/2014 1721   VLDL 18 12/12/2014 1721   LDLCALC 129 (H) 10/10/2018 1141     WEIGHTS: Wt Readings from Last 3 Encounters:  01/17/20 204 lb 6.4 oz (92.7 kg)  01/14/20 202 lb 12.8 oz (92 kg)  11/22/19 199 lb 12.8 oz (90.6 kg)    VITALS: BP 114/70   Pulse 78   Ht 5\' 7"  (1.702 m)   Wt 204 lb 6.4 oz (92.7 kg)   BMI 32.01 kg/m   EXAM: General appearance: alert and no distress Neck: no carotid bruit, no JVD and thyroid not enlarged, symmetric, no tenderness/mass/nodules Lungs: rales bibasilar Heart: regular rate and rhythm Abdomen: soft, non-tender; bowel sounds normal; no masses,  no organomegaly and obese Extremities: extremities normal, atraumatic, no cyanosis or edema Pulses: 2+ and symmetric Skin: Skin color, texture, turgor normal. No rashes or lesions Neurologic: Grossly normal : Pleasant  EKG: Deferred  ASSESSMENT: 1. History of multivessel coronary artery disease, including 90% LAD distal stenosis and 50 to 60% mid LAD stenosis of a tortuous vessel (12/2014), without angina 2. History of bladder CA with hematuria, resolved 3. Failed Watchman LAA procedure in 2016 4. History of hypertensive heart disease 5. PAF - CHADVASC score of 5 6. Obesity 7. Hyperlipidemia 8. OSA on CPAP 9. History of TIA/stroke 10. Hypothyroidism 11. Possible left upper tract urothelial carcinoma-status post resection and radical left nephro ureterectomy. 12. Interstitial lung disease  PLAN: 1.   Mr. Gombos has had recent chest pain but did not have any ischemic  changes on his Myoview.  His symptoms were not improved with nitro.  We discussed about possible treatment to have smaller vessel coronary disease with a long-acting nitrate or Ranexa, he is not interested however in adding any medicines at this time.  He was found to have probable interstitial lung disease and is following up with pulmonary regarding this.  He prefers to watch his symptoms at this time and if they worsen we could consider further medical therapy or invasive catheterization.  Plan follow-up in 6 months or sooner as necessary.  Pixie Casino, MD, [facility], [place] Director of the [facility] Lipid [facility] &  Cardiovascular Risk Reduction Clinic Diplomate of the American Board of Clinical Lipidology Attending Cardiologist  Direct Dial: [phone]  Fax: [phone]  Website:  [email or web address] 01/17/2020, 3:13 PM

## 2020-01-17 NOTE — Patient Instructions (Signed)

## 2020-01-18 LAB — HYPERSENSITIVITY PNEUMONITIS
A. Pullulans Abs: NEGATIVE
A.Fumigatus #1 Abs: NEGATIVE
Micropolyspora faeni, IgG: NEGATIVE
Pigeon Serum Abs: NEGATIVE
Thermoact. Saccharii: NEGATIVE
Thermoactinomyces vulgaris, IgG: NEGATIVE

## 2020-01-29 LAB — MYOMARKER 3 PLUS PROFILE (RDL)

## 2020-01-30 ENCOUNTER — Ambulatory Visit: Payer: PPO | Admitting: Internal Medicine

## 2020-02-03 ENCOUNTER — Other Ambulatory Visit: Payer: Self-pay | Admitting: Internal Medicine

## 2020-02-04 MED ORDER — APIXABAN 2.5 MG PO TABS: 2.5000 mg | ORAL_TABLET | Freq: Two times a day (BID) | ORAL | 1 refills | Status: DC

## 2020-02-04 NOTE — Telephone Encounter (Signed)
49m 204 lb 6.4 oz (92.7 kg) Creatinine, Ser (10/23/19) 0.61 - 1.24 mg/dL 1.77High   LOV/HILTY 01/17/20 Pt requesting refill of 5mg  eliquis but only qualifies for 2.5mg . will route to pharmd pool for further review

## 2020-02-19 DIAGNOSIS — L820 Inflamed seborrheic keratosis: Secondary | ICD-10-CM | POA: Diagnosis not present

## 2020-02-19 DIAGNOSIS — D225 Melanocytic nevi of trunk: Secondary | ICD-10-CM | POA: Diagnosis not present

## 2020-02-19 DIAGNOSIS — L905 Scar conditions and fibrosis of skin: Secondary | ICD-10-CM | POA: Diagnosis not present

## 2020-02-19 DIAGNOSIS — C44319 Basal cell carcinoma of skin of other parts of face: Secondary | ICD-10-CM | POA: Diagnosis not present

## 2020-02-19 DIAGNOSIS — D485 Neoplasm of uncertain behavior of skin: Secondary | ICD-10-CM | POA: Diagnosis not present

## 2020-03-04 ENCOUNTER — Other Ambulatory Visit (HOSPITAL_COMMUNITY): Payer: Medicare Other

## 2020-03-04 ENCOUNTER — Other Ambulatory Visit (HOSPITAL_COMMUNITY)
Admission: RE | Admit: 2020-03-04 | Discharge: 2020-03-04 | Disposition: A | Discharge status: Home / Self Care | Payer: Medicare Other | Source: Ambulatory Visit | Attending: Pulmonary Disease | Admitting: Pulmonary Disease

## 2020-03-04 DIAGNOSIS — Z01812 Encounter for preprocedural laboratory examination: Secondary | ICD-10-CM | POA: Diagnosis not present

## 2020-03-04 DIAGNOSIS — Z20822 Contact with and (suspected) exposure to covid-19: Secondary | ICD-10-CM | POA: Insufficient documentation

## 2020-03-05 LAB — SARS CORONAVIRUS 2 (TAT 6-24 HRS): SARS Coronavirus 2: NEGATIVE

## 2020-03-06 ENCOUNTER — Other Ambulatory Visit: Payer: Self-pay | Admitting: Pulmonary Disease

## 2020-03-06 DIAGNOSIS — J849 Interstitial pulmonary disease, unspecified: Secondary | ICD-10-CM

## 2020-03-07 ENCOUNTER — Ambulatory Visit (INDEPENDENT_AMBULATORY_CARE_PROVIDER_SITE_OTHER): Payer: Medicare Other | Admitting: Pulmonary Disease

## 2020-03-07 ENCOUNTER — Other Ambulatory Visit: Payer: Self-pay

## 2020-03-07 ENCOUNTER — Ambulatory Visit: Payer: Medicare Other | Admitting: Pulmonary Disease

## 2020-03-07 ENCOUNTER — Encounter: Payer: Self-pay | Admitting: Pulmonary Disease

## 2020-03-07 VITALS — BP 106/74 | HR 74 | Temp 97.3°F | Ht 67.0 in | Wt 202.0 lb

## 2020-03-07 DIAGNOSIS — R768 Other specified abnormal immunological findings in serum: Secondary | ICD-10-CM | POA: Diagnosis not present

## 2020-03-07 DIAGNOSIS — R911 Solitary pulmonary nodule: Secondary | ICD-10-CM

## 2020-03-07 DIAGNOSIS — K219 Gastro-esophageal reflux disease without esophagitis: Secondary | ICD-10-CM | POA: Diagnosis not present

## 2020-03-07 DIAGNOSIS — J849 Interstitial pulmonary disease, unspecified: Secondary | ICD-10-CM

## 2020-03-07 LAB — PULMONARY FUNCTION TEST
DL/VA % pred: 72 %
DL/VA: 2.82 ml/min/mmHg/L
DLCO cor % pred: 71 %
DLCO cor: 15.99 ml/min/mmHg
DLCO unc % pred: 71 %
DLCO unc: 15.99 ml/min/mmHg
FEF 25-75 Post: 3.07 L/sec
FEF 25-75 Pre: 2.31 L/sec
FEF2575-%Change-Post: 32 %
FEF2575-%Pred-Post: 186 %
FEF2575-%Pred-Pre: 140 %
FEV1-%Change-Post: 4 %
FEV1-%Pred-Post: 119 %
FEV1-%Pred-Pre: 113 %
FEV1-Post: 2.94 L
FEV1-Pre: 2.82 L
FEV1FVC-%Change-Post: 2 %
FEV1FVC-%Pred-Pre: 108 %
FEV6-%Change-Post: 2 %
FEV6-%Pred-Post: 112 %
FEV6-%Pred-Pre: 110 %
FEV6-Post: 3.68 L
FEV6-Pre: 3.6 L
FEV6FVC-%Change-Post: 0 %
FEV6FVC-%Pred-Post: 106 %
FEV6FVC-%Pred-Pre: 106 %
FVC-%Change-Post: 1 %
FVC-%Pred-Post: 105 %
FVC-%Pred-Pre: 103 %
FVC-Post: 3.73 L
FVC-Pre: 3.66 L
Post FEV1/FVC ratio: 79 %
Post FEV6/FVC ratio: 99 %
Pre FEV1/FVC ratio: 77 %
Pre FEV6/FVC Ratio: 98 %
RV % pred: 64 %
RV: 1.65 L
TLC % pred: 81 %
TLC: 5.38 L

## 2020-03-07 NOTE — Patient Instructions (Signed)
We will make referral to gastroenterology for evaluation of acid reflux Follow-up in 6 months.

## 2020-03-07 NOTE — Progress Notes (Signed)
PFT done today. 

## 2020-03-07 NOTE — Progress Notes (Signed)
Charles Wright    [id number]    03/10/1935  Primary Care Physician:Reade, Herbie [place], MD  Referring Physician: Maury Dus, MD [address] [place],  [place] [address]  Chief complaint: Follow up for ILD  HPI: 85 year old with history of prostate, bladder, renal cancer, atrial fibrillation, coronary artery disease, sleep apnea Has complains of chronic cough, nonproductive in nature for the past 1 year.  Not associated with dyspnea or congestion He has been tried on Protonix for empiric treatment of GERD with no improvement  He had a high-res CT recently which showed progressive probable UIP pattern pulmonary fibrosis and has been referred to clinic for further evaluation  Reports a viral illness in January 2020 after attending a conference in [place].  He was not tested for Covid at that time  Pets: Dogs, cats.  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  No asbestos, mold, hot tub, Jacuzzi or down pillows, comforters. ILD questionnaire 01/14/20-negative except as above. Smoking history: Occasionally smokes cigarettes and pipe in college Travel history: No significant travel Relevant family history: No significant family history of lung disease.  Interval history: Here for review of labs and plan for next steps States that his mild dyspnea on exertion is unchanged.  No new complaints today  Continues to have significant acid reflux  Outpatient Encounter Medications as of 03/07/2020  Medication Sig  . acetaminophen (TYLENOL) 325 MG tablet 2 tablets  . allopurinol (ZYLOPRIM) 300 MG tablet Take 300 mg by mouth every morning.   Marland Kitchen amiodarone (PACERONE) 200 MG tablet 1 tablet  . apixaban (ELIQUIS) 2.5 MG TABS tablet Take 1 tablet (2.5 mg total) by mouth 2 (two) times daily.  Marland Kitchen aspirin EC 81 MG tablet Take 81 mg by mouth daily.  .  Cholecalciferol (VITAMIN D3) 125 MCG (5000 UT) TABS 1 tablet  . colchicine 0.6 MG tablet 2 tablets  . doxycycline (MONODOX) 50 MG capsule Take 50 mg by mouth as needed.  Marland Kitchen ELIQUIS 5 MG TABS tablet TAKE 1 TABLET BY MOUTH TWO TIMES A DAY  . EPINEPHrine 0.3 mg/0.3 mL IJ SOAJ injection Inject 0.3 mLs (0.3 mg total) into the muscle once.  Marland Kitchen levothyroxine (SYNTHROID, LEVOTHROID) 50 MCG tablet Take 50 mcg by mouth daily before breakfast.  . nitroGLYCERIN (NITROSTAT) 0.4 MG SL tablet Place 1 tablet (0.4 mg total) under the tongue every 5 (five) minutes x 3 doses as needed for chest pain.  . pravastatin (PRAVACHOL) 40 MG tablet Take 1 tablet (40 mg total) by mouth daily.  . Testosterone 1.62 % GEL Apply 2 Pump topically every other day.   . metoprolol tartrate (LOPRESSOR) 25 MG tablet Take 1 tablet (25 mg total) by mouth 2 (two) times daily.   No facility-administered encounter medications on file as of 03/07/2020.    Physical Exam: Blood pressure 106/74, pulse 74, temperature (!) 97.3 F (36.3 C), temperature source Oral, height 5\' 7"  (1.702 m), weight 202 lb (91.6 kg), SpO2 98 %. Gen:      No acute distress HEENT:  EOMI, sclera anicteric Neck:     No masses; no thyromegaly Lungs:    Clear to auscultation bilaterally; normal respiratory effort CV:         Regular rate and rhythm; no murmurs Abd:      + bowel sounds; soft, non-tender; no palpable masses, no  distension Ext:    No edema; adequate peripheral perfusion Skin:      Warm and dry; no rash Neuro: alert and oriented x 3 Psych: normal mood and affect  Data Reviewed: Imaging: CT abdomen pelvis 03/18/2019 -mild interstitial changes at the base High-res CT 12/07/2019-left upper lobe pulmonary nodule measuring 5 mm, basilar predominant fibrosis and probable UIP pattern.  Progressive compared to prior scans I have reviewed the images personally.  PFTs: 06/18/2015 FVC 3.94 (6%), FEV1 3.11 (18%), F/F 79, TLC 6.46 [90%], DLCO 24.74 [85%] Normal  test  03/07/2020 FVC 3.73 [105%], FEV1 2.94 (119%], F/F 79, TLC 5.38 [81%], DLCO 15.99 [71%] Mild diffusion defect  Labs:  ANA serologies 01/14/2020-positive for ANA 1 is to 80, p-ANCA 1 is to 80  Assessment:  ILD Review of CT scan shows probable UIP pattern fibrosis.  He has minimal changes at the base dating back to at least 2012 on CTs of the abdomen.  It is more clearly progressive since his last CT abdomen in 2020.  This is suggestive of IPF in the absence of any exposures or CTD symptoms  CTD serologies reviewed with mild elevation in ANA, ANCA.  This may be nonspecific Referral to rheumatology for evaluation  Discussed further work-up, treatment with patient.  He is not interested in biopsy or treatment with antifibrotics at this stage.  Follow-up in 6 months to review  GERD Referral to GI for management as ongoing acid reflux can make the ILD worse  Lung nodule 3 months follow-up CT ordered  Plan/Recommendations: Referral to GI, rheumatology Follow-up CT chest high-res  Marshell Garfinkel MD [facility] 03/07/2020, 2:16 PM  CC: Maury Dus, MD

## 2020-03-17 ENCOUNTER — Encounter: Payer: Self-pay | Admitting: Nurse Practitioner

## 2020-03-27 ENCOUNTER — Ambulatory Visit: Payer: Medicare Other | Admitting: Nurse Practitioner

## 2020-04-01 DIAGNOSIS — C44319 Basal cell carcinoma of skin of other parts of face: Secondary | ICD-10-CM | POA: Diagnosis not present

## 2020-04-03 DIAGNOSIS — C652 Malignant neoplasm of left renal pelvis: Secondary | ICD-10-CM | POA: Diagnosis not present

## 2020-04-07 ENCOUNTER — Ambulatory Visit: Payer: Medicare Other | Admitting: Gastroenterology

## 2020-04-09 ENCOUNTER — Ambulatory Visit (HOSPITAL_COMMUNITY)
Admission: RE | Admit: 2020-04-09 | Discharge: 2020-04-09 | Disposition: A | Discharge status: Home / Self Care | Payer: Medicare Other | Source: Ambulatory Visit | Attending: Urology | Admitting: Urology

## 2020-04-09 ENCOUNTER — Other Ambulatory Visit (HOSPITAL_COMMUNITY): Payer: Self-pay | Admitting: Urology

## 2020-04-09 ENCOUNTER — Other Ambulatory Visit: Payer: Self-pay

## 2020-04-09 DIAGNOSIS — R059 Cough, unspecified: Secondary | ICD-10-CM | POA: Diagnosis not present

## 2020-04-09 DIAGNOSIS — C652 Malignant neoplasm of left renal pelvis: Secondary | ICD-10-CM | POA: Diagnosis not present

## 2020-04-09 DIAGNOSIS — K432 Incisional hernia without obstruction or gangrene: Secondary | ICD-10-CM | POA: Diagnosis not present

## 2020-04-09 DIAGNOSIS — R053 Chronic cough: Secondary | ICD-10-CM | POA: Diagnosis not present

## 2020-04-09 DIAGNOSIS — C674 Malignant neoplasm of posterior wall of bladder: Secondary | ICD-10-CM | POA: Diagnosis not present

## 2020-04-09 DIAGNOSIS — Z85528 Personal history of other malignant neoplasm of kidney: Secondary | ICD-10-CM | POA: Diagnosis not present

## 2020-04-09 DIAGNOSIS — K439 Ventral hernia without obstruction or gangrene: Secondary | ICD-10-CM | POA: Diagnosis not present

## 2020-04-18 ENCOUNTER — Ambulatory Visit (INDEPENDENT_AMBULATORY_CARE_PROVIDER_SITE_OTHER): Payer: Medicare Other | Admitting: Internal Medicine

## 2020-04-18 ENCOUNTER — Encounter: Payer: Self-pay | Admitting: Internal Medicine

## 2020-04-18 ENCOUNTER — Other Ambulatory Visit: Payer: Self-pay

## 2020-04-18 VITALS — BP 116/66 | HR 62 | Ht 67.0 in | Wt 196.8 lb

## 2020-04-18 DIAGNOSIS — I480 Paroxysmal atrial fibrillation: Secondary | ICD-10-CM | POA: Diagnosis not present

## 2020-04-18 DIAGNOSIS — E785 Hyperlipidemia, unspecified: Secondary | ICD-10-CM

## 2020-04-18 DIAGNOSIS — J849 Interstitial pulmonary disease, unspecified: Secondary | ICD-10-CM | POA: Diagnosis not present

## 2020-04-18 DIAGNOSIS — I2510 Atherosclerotic heart disease of native coronary artery without angina pectoris: Secondary | ICD-10-CM

## 2020-04-18 NOTE — Patient Instructions (Signed)

## 2020-04-18 NOTE — Progress Notes (Signed)
OFFICE NOTE  Chief Complaint:  Follow-up  Primary Care Physician: Maury Dus, MD  HPI:  Charles Wright is a 85 y.o. male with a past medial history significant for coronary artery disease with non-STEMI in 2016.  He was found to have distal LAD disease not amenable to PCI as well as moderate mid to proximal LAD disease.  Medical therapy was recommended.  He also has a history of bladder cancer, prostate cancer, hyperlipidemia, hypertension and OSA on CPAP.  He has been followed by Dr. Wynonia Lawman.  He maintains on clopidogrel but not aspirin.  Recently he has been having issues with ureteral stricture and is recommended to have a cystoscopy with Dr. Junious Silk.  He would require clearance including holding clopidogrel prior to that procedure.  Today in the office he is coughing significantly.  He apparently had upper respiratory infection and then also had concomitant rash which was painful and vesicular and diagnosed as shingles by a dermatologist that he is friends with.  Besides his shortness of breath related upper respiratory infection as well as chest wall pain from shingles, he denies any chest pain concerning for angina or recent worsening shortness of breath.  10/10/2018  Charles Wright returns today for follow-up of coronary disease and atrial fibrillation.  He is a former patient of Dr. Wynonia Lawman however Dr. Wynonia Lawman is now retired.  When we last spoke he received preoperative clearance for urologic procedure.  He does have a history of a bladder cancer and hematuria.  Interestingly, based on further chart review, he had had a prior watchman procedure for his A. fib attempt at stroke reduction.  At that time because of bleeding he was not felt to be a candidate for anticoagulation for his A. fib.  He had been on amiodarone.  He was then referred for watchman procedure which shows ultimately unsuccessful.  I was involved in the imaging component of that procedure.  Subsequently it was recommended he go on  aspirin and Plavix since he was not a candidate for anticoagulation due to GU bleeding from bladder cancer.  That has however been successfully treated and has had no further bleeding issues.  He also just had recent ureteral stenting which seems to have been successful.  When reviewing his chart today however it it notes that he is only on clopidogrel monotherapy.  Have an STEMI in 2016 and was found to have significant coronary disease however not amenable to intervention.  He is done well with that without any further chest pain however did have significant fatigue recently.  It turned out this was related to low testosterone and he is now on testosterone replacement.  Likely a side effect of antiandrogen therapy.  Recent labs show total cholesterol 158, HDL 50 LDL, 86 and triglycerides 115.  05/02/2019  Charles Wright returns today for follow-up. He is complaining of some issues with lightheadedness. He recently underwent surgery for what was suspicious for a left upper tract urothelial carcinoma. This was complicated by pain and bladder spasms requiring multiple ER visits. He has had a decreased appetite and weight loss (5 pounds of which was his left kidney/ureter. Additionally, he is down a total of 20 pounds since I last saw him. This is pertinent because his blood pressure today was 90/60. In general it has run higher than this. I suspect it may be a reason why feels lightheaded. His EKG shows a sinus bradycardia at 55. Possible medications that could causes include his isosorbide and metoprolol which she  takes 50 mg twice daily.  08/21/2019  Charles Wright seen today in follow-up.  Overall he continues to do well.  He says since he had his nephrectomies had no further bleeding issues.  He denies any chest pain or worsening shortness of breath.  Recently had some orthostatic hypotension with low blood pressure noted to be in the 08X systolic.  He was taken off of his Imdur and his beta-blocker was decreased.   Blood pressure today was 116/72 and his mention he feels well.  01/17/2020  Charles Wright is seen today for follow-up.  Over the summer he had chest pain and was seen by Almyra Deforest, PA-C.  Stress test was performed which showed small fixed apical defect without any ischemia, consistent with known distal LAD severe stenosis.  This vessel was thought to be too small to intervene on.  No other reversible ischemia was appreciated.  He noted that his chest pain was not improved with nitroglycerin.  During his exam he was also found to have bibasilar dry crackles consistent with possible interstitial lung disease.  High-resolution CT scan was performed which did corroborate this.  He has subsequently been referred to pulmonary and saw Dr. Vaughan Browner, who feels he may have IPF.  He has pending pulmonary function tests and follow-up with him in January.  He reports he still gets some intermittent chest discomfort in the left upper chest.  04/18/2020  Charles Wright returns for follow-up.  He continues to have work-up of pulmonary fibrosis.  He remains on low-dose Eliquis 2.5 mg twice daily.  Heart rate is in the 60s today.  EKG shows sinus rhythm with frequent PVCs in a quadrigeminal pattern.  He says he is really unaware of any extrasystoles or palpitations.  Weight is down about 6 pounds since we last saw him.  PMHx:  Past Medical History:  Diagnosis Date  . Acute gout of left ankle    06-14-2015  . Bladder cancer (HCC)    BCG's tx's  . BPH (benign prostatic hypertrophy)   . CAD (coronary artery disease) CARDIOLOGIST-  DR TILLEY   a. NSTEMI 12/2014 -  99% dLAD-2 (not amenable to PCI), 50% dLAD-1, 60% mLAD. Medical therapy was recommended.  . Cough    HARSH NONPRODUCTIVE COUGH 1 AND 1/2 WEEKS  . GERD (gastroesophageal reflux disease)    takes  OTC periodically  . Gilbert's syndrome   . H/O cardiac catheterization    a. Note: Difficult radial access in 12/2014 - recommend femoral approach if cath needed in the  future.  Marland Kitchen History of kidney stones   . History of non-ST elevation myocardial infarction (NSTEMI)    12-12-2014   CARDIAC CATH W/ NO INTERVENTION X 2FEW DAYS APART  . History of spinal fracture 2002   LUMBAR  . Hyperlipidemia   . Hypertension   . Hypothyroidism   . Myocardial infarction ([place])   . OSA on CPAP    SET ON 7 USES SOME NIGHTS  . Paroxysmal A-fib [facility])    cardiologist-  dr Wynonia Lawman  . Prostate cancer ([place]) 2019   last PSA  2.9  (montiored by urologist  dr Junious Silk) [place] FEB 2019 RAD DONE  . Shingles    2 weeks ago  . Wears glasses     Past Surgical History:  Procedure Laterality Date  . BACK SURGERY  2014   LOWER l 1, 2 4 AND 5  . BILATERAL INGUINAL HERNIA REPAIR    . CARDIAC CATHETERIZATION N/A 12/13/2014  Procedure: Left Heart Cath and Coronary Angiography;  Surgeon: Leonie Man, MD;  Location: [facility];  Service: Cardiovascular;  Laterality: N/A;  culprit lesion diat LAD-2  99%, not approachable via PCTA  due to extremely tortuous up stream LAD/  mLAD 60% and dLAD-1 50%, both are at the extremely tortuous segment and not PCI targets/  otherwise normal coronary arteries  . COLONOSCOPY WITH PROPOFOL N/A 01/16/2019   Procedure: COLONOSCOPY WITH PROPOFOL;  Surgeon: Clarene Essex, MD;  Location: [facility];  Service: Endoscopy;  Laterality: N/A;  . CYSTOSCOPY W/ RETROGRADES Left 08/22/2012   Procedure: CYSTOSCOPY WITH RETROGRADE PYELOGRAM;  left kidney washings;  Surgeon: Fredricka Bonine, MD;  Location: [facility];  Service: Urology;  Laterality: Left;  . CYSTOSCOPY W/ RETROGRADES N/A 07/08/2015   Procedure: CYSTOSCOPY WITH RETROGRADE PYELOGRAM;  Surgeon: [name], MD;  Location: [facility];  Service: Urology;  Laterality: N/A;  . CYSTOSCOPY W/ URETERAL STENT PLACEMENT Left 03/24/2019   Procedure: CYSTOSCOPY WITH RETROGRADE PYELOGRAM/URETERAL STENT PLACEMENT ureteroscopy biopsy of neoplasm;  Surgeon: [name], MD;  Location[facility];  Service: Urology;  Laterality: Left;  . CYSTOSCOPY W/ URETERAL STENT PLACEMENT Left 04/08/2019   Procedure: CYSTOSCOPY cystogram clot evacuation left ureteroscopy clot evacuation left stent exchange liimited transuretheral resectio of prastate and fulguration  bleeders of prostate;  Surgeon: [name], MD;  Location[facility];  Service: Urology;  Laterality: Left;  . CYSTOSCOPY WITH BIOPSY  08/22/2012   Procedure: CYSTOSCOPY WITH BIOPSY;  Surgeon: Fredricka Bonine, MD;  Location: [facility];  Service: Urology;;  . [name] WITH BIOPSY N/A 11/08/2014   Procedure: CYSTOSCOPY/BIOPSPY BLADDER INSTILLATION OF MARCAINE AND PYRIDIUM;  Surgeon: [name], MD;  Location: [facility];  Service: Urology;  Laterality: N/A;  . CYSTOSCOPY WITH BIOPSY N/A 07/08/2015   Procedure: CYSTOSCOPY WITH BLADDER BIOPSY, FULGURATION, RETROGRADE PYLOGRAM AND RENAL WASHING;  Surgeon: [name], MD;  Location[facility];  Service: Urology;  Laterality: N/A;  . CYSTOSCOPY WITH RETROGRADE PYELOGRAM, URETEROSCOPY AND STENT PLACEMENT Bilateral 07/30/2014   Procedure: CYSTOSCOPY WITH BLADDER BX FULERGATION AND BILATERAL RETROGRADE PYELOGRAM,;  Surgeon: [name], MD;  [facility];  Service: Urology;  Laterality: Bilateral;  . CYSTOSCOPY WITH STENT PLACEMENT Left 03/24/2018   Procedure: STENT PLACEMENT AND BIOPSY;  Surgeon: [name], MD;  Location: [facility];  Service: Urology;  Laterality: Left;  . CYSTOSCOPY/RETROGRADE/URETEROSCOPY Bilateral 08/17/2013   Procedure: CYSTOSCOPY, BLADDER BIOPSY WITH BILATERAL RETROGRADE PYELOGRAM, LEFT URETEROSCOPY AND DILATION OF STRICTURE ;  Surgeon: [name], MD;  Location: [facility];  Service: Urology;  Laterality: Bilateral;  . CYSTOSCOPY/RETROGRADE/URETEROSCOPY Left 03/24/2018   Procedure: CYSTOSCOPY/RETROGRADE/URETEROSCOPY;  Surgeon:  [name], MD;  Location: [facility];  Service: Urology;  Laterality: Left;  . HOT HEMOSTASIS N/A 01/16/2019   Procedure: HOT HEMOSTASIS (ARGON PLASMA COAGULATION/BICAP);  Surgeon: Clarene Essex, MD;  Location: [facility];  Service: Endoscopy;  Laterality: N/A;  . LEFT ATRIAL APPENDAGE OCCLUSION N/A 01/09/2015   Procedure: LEFT ATRIAL APPENDAGE OCCLUSION;  Surgeon: [name], MD;  Location: [facility];  Service: Cardiovascular;  Laterality: N/A;  . LUMBAR LAMINECTOMY/DECOMPRESSION MICRODISCECTOMY Left 12/03/2013   Procedure: Left Lumbar three-four diskectomy with Lumbar four laminectomy ;  Surgeon: Newman Pies, MD;  Location: [facility];  Service: Neurosurgery;  Laterality: Left;  Left Lumbar three-four diskectomy with Lumbar four laminectomy   . LYMPH NODE DISSECTION Bilateral 04/13/2019   Procedure:  LYMPH NODE DISSECTION;  Surgeon: Alexis Frock, MD;  Location: [facility];  Service: Urology;  Laterality: Bilateral;  . ORIF LEFT ANKLE FX  2005  . POLYPECTOMY  01/16/2019   Procedure: POLYPECTOMY;  Surgeon: Clarene Essex, MD;  [facility];  Service: Endoscopy;;  . PROSTATE BIOPSY N/A 08/22/2012   Procedure: BIOPSY TRANSRECTAL ULTRASONIC PROSTATE (TUBP);  Surgeon: Fredricka Bonine, MD;  Location: [facility];  Service: Urology;  Laterality: N/A;  . ROBOT ASSITED LAPAROSCOPIC NEPHROURETERECTOMY Left 04/13/2019   Procedure: XI ROBOT ASSITED LAPAROSCOPIC NEPHROURETERECTOMY;  Surgeon: Alexis Frock, MD;  [facility];  Service: Urology;  Laterality: Left;  3 HRS  . TEE WITHOUT CARDIOVERSION N/A 12/31/2014   Procedure: TRANSESOPHAGEAL ECHOCARDIOGRAM (TEE);  Surgeon: Pixie Casino, MD;  Location: [facility];  Service: Cardiovascular;  Laterality: N/A;  ef 55-60%/  mild MR, TR, and PR/  mild LAE and RAE  . TRANSURETHRAL RESECTION OF BLADDER TUMOR  10/22/2011   Procedure: TRANSURETHRAL RESECTION OF BLADDER TUMOR (TURBT);  Surgeon:  Fredricka Bonine, MD;  Location: [facility];  Service: Urology;  Laterality: N/A;  TURBT, LEFT URETEROSCOPY, POSSIBLE URETERAL STENT   . URETEROSCOPY  10/22/2011   Procedure: URETEROSCOPY;  Surgeon: Fredricka Bonine, MD;  [facility];  Service: Urology;  Laterality: Left;  . WISDOM TOOTH EXTRACTION      FAMHx:  Family History  Problem Relation Age of Onset  . Leukemia Mother   . Cancer Mother        leukemia  . Heart attack Father   . Cancer Sister        breast  . Hypertension Neg Hx   . Stroke Neg Hx     SOCHx:   reports that he has never smoked. He has never used smokeless tobacco. He reports current alcohol use of about 7.0 standard drinks of alcohol per week. He reports that he does not use drugs.  ALLERGIES:  Allergies  Allergen Reactions  . Bee Venom Anaphylaxis    Actually Hornets and Wasps.   . Losartan Other (See Comments)    Cough   . Oxycodone Other (See Comments)    ALTERED MENTAL STATUS GETS MEAN    ROS: Pertinent items noted in HPI and remainder of comprehensive ROS otherwise negative.  HOME MEDS: Current Outpatient Medications on File Prior to Visit  Medication Sig Dispense Refill  . acetaminophen (TYLENOL) 325 MG tablet 2 tablets    . allopurinol (ZYLOPRIM) 300 MG tablet Take 300 mg by mouth every morning.     Marland Kitchen amiodarone (PACERONE) 200 MG tablet 1 tablet    . apixaban (ELIQUIS) 2.5 MG TABS tablet Take 1 tablet (2.5 mg total) by mouth 2 (two) times daily. 180 tablet 1  . aspirin EC 81 MG tablet Take 81 mg by mouth daily.    . benzonatate (TESSALON) 100 MG capsule 1-2 capsules    . Cholecalciferol (VITAMIN D3) 125 MCG (5000 UT) TABS 1 tablet    . colchicine 0.6 MG tablet 2 tablets    . doxycycline (MONODOX) 50 MG capsule Take 50 mg by mouth as needed.    Marland Kitchen ELIQUIS 5 MG TABS tablet TAKE 1 TABLET BY MOUTH TWO TIMES A DAY 60 tablet 5  . EPINEPHrine 0.3 mg/0.3 mL IJ SOAJ injection Inject 0.3 mLs (0.3  mg total) into the muscle once. 2 Device 1  . levothyroxine (SYNTHROID, LEVOTHROID) 50 MCG tablet Take 50 mcg by mouth daily before breakfast.    .  nitroGLYCERIN (NITROSTAT) 0.4 MG SL tablet Place 1 tablet (0.4 mg total) under the tongue every 5 (five) minutes x 3 doses as needed for chest pain. 25 tablet 12  . pravastatin (PRAVACHOL) 40 MG tablet Take 1 tablet (40 mg total) by mouth daily. 30 tablet   . Testosterone 1.62 % GEL Apply 2 Pump topically every other day.     . metoprolol tartrate (LOPRESSOR) 25 MG tablet Take 1 tablet (25 mg total) by mouth 2 (two) times daily. 180 tablet 3   No current facility-administered medications on file prior to visit.    LABS/IMAGING: No results found for this or any previous visit (from the past 48 hour(s)). No results found.  LIPID PANEL:    Component Value Date/Time   CHOL 195 10/10/2018 1141   TRIG 136 10/10/2018 1141   HDL 42 10/10/2018 1141   CHOLHDL 4.6 10/10/2018 1141   CHOLHDL 4.4 12/12/2014 1721   VLDL 18 12/12/2014 1721   LDLCALC 129 (H) 10/10/2018 1141     WEIGHTS: Wt Readings from Last 3 Encounters:  04/18/20 196 lb 12.8 oz (89.3 kg)  03/07/20 202 lb (91.6 kg)  01/17/20 204 lb 6.4 oz (92.7 kg)    VITALS: BP 116/66   Pulse 62   Ht 5\' 7"  (1.702 m)   Wt 196 lb 12.8 oz (89.3 kg)   SpO2 97%   BMI 30.82 kg/m   EXAM: General appearance: alert and no distress Neck: no carotid bruit, no JVD and thyroid not enlarged, symmetric, no tenderness/mass/nodules Lungs: rales bibasilar Heart: regular rate and rhythm Abdomen: soft, non-tender; bowel sounds normal; no masses,  no organomegaly and obese Extremities: extremities normal, atraumatic, no cyanosis or edema Pulses: 2+ and symmetric Skin: Skin color, texture, turgor normal. No rashes or lesions Neurologic: Grossly normal : Pleasant  EKG: Sinus rhythm with frequent PVCs in a quadrigeminal pattern at 62-personally reviewed  ASSESSMENT: 1. History of multivessel coronary  artery disease, including 90% LAD distal stenosis and 50 to 60% mid LAD stenosis of a tortuous vessel (12/2014), without angina 2. History of bladder CA with hematuria, resolved 3. Failed Watchman LAA procedure in 2016 4. History of hypertensive heart disease 5. PAF - CHADVASC score of 5 6. Obesity 7. Hyperlipidemia 8. OSA on CPAP 9. History of TIA/stroke 10. Hypothyroidism 11. Possible left upper tract urothelial carcinoma-status post resection and radical left nephro ureterectomy. 12. Interstitial lung disease 13. Frequent PVCs  PLAN: 1.   Charles Wright continues to undergo work-up and treatment of pulmonary fibrosis.  He does have AF paroxysmally but is not in AF today.  He does have frequent PVCs but he is asymptomatic with this.  He does get some occasional positional dizziness with quick changes.  He is tolerating low-dose Eliquis without bleeding issues.  No medication changes today.  Follow-up annually or sooner as necessary.  Pixie Casino, MD, [facility], [place] Director of the [facility]  [facility] Diplomate of the American Board of Clinical Lipidology Attending Cardiologist  Direct Dial: [phone]  Fax: [phone]  Website:  [email or web address] 04/18/2020, 11:31 AM

## 2020-04-21 DIAGNOSIS — C61 Malignant neoplasm of prostate: Secondary | ICD-10-CM | POA: Diagnosis not present

## 2020-04-21 DIAGNOSIS — C674 Malignant neoplasm of posterior wall of bladder: Secondary | ICD-10-CM | POA: Diagnosis not present

## 2020-04-21 DIAGNOSIS — C652 Malignant neoplasm of left renal pelvis: Secondary | ICD-10-CM | POA: Diagnosis not present

## 2020-04-22 ENCOUNTER — Encounter: Payer: Self-pay | Admitting: Nurse Practitioner

## 2020-04-22 ENCOUNTER — Telehealth: Payer: Self-pay | Admitting: *Deleted

## 2020-04-22 ENCOUNTER — Ambulatory Visit: Payer: Medicare Other | Admitting: Nurse Practitioner

## 2020-04-22 VITALS — BP 120/64 | HR 88 | Ht 67.0 in | Wt 196.0 lb

## 2020-04-22 DIAGNOSIS — Z7901 Long term (current) use of anticoagulants: Secondary | ICD-10-CM

## 2020-04-22 DIAGNOSIS — R0789 Other chest pain: Secondary | ICD-10-CM | POA: Diagnosis not present

## 2020-04-22 DIAGNOSIS — R053 Chronic cough: Secondary | ICD-10-CM | POA: Diagnosis not present

## 2020-04-22 NOTE — Progress Notes (Signed)
ASSESSMENT AND PLAN     # 85 yo male with two year history of intermittent non-productive cough not associated with exertion or dyspnea. Occasionally coughs during meals.  Referred by Pulmonary for management of acid reflux to avoid worsening interstitial lung disease.  --Denies history of GERD symptoms such as reflux or heartburn. No improvement in cough after a month of PPI . --Patient is frustrated with lack of answers regarding his chronic cough. He feels like the problem is in his trachea and inquires about an seeing an ENT.  --I explained that he could have laryngopharyngeal reflux with the only manifestation being that of a chronic cough.   --Will arrange for EGD to look for esophagitis or other evidence for reflux. I explained that he can still have reflux in the absence of endoscopic findings.  Therefore if EGD is negative we will plan for 24-hour pH study.  In fact , we will go ahead and get this scheduled to be done following EGD. --If findings on EGD support presence of GERD then we can cancel the pH study  # Interstitial lung disease, recently found on CT scan. Had initial consultation with PCCM, Dr. Vaughan Browner in early December .    # Intermittent chest pain evaluated by Cardiology. Stress test negative for ischemia, consistent with known distal LAD severe stenosis. The vessel was thought to be too small for intervention. No other reversible ischemia was appreciated. Episodes of pain are random, not related to anything in particular. Could be a manifestation of GERD.   # Paroxysmal Atrial fibrillation on Eliquis.  --Hold Eliquis for 2 days before procedure - will instruct when and how to resume after procedure. Patient understands that there is a low but real risk of cardiovascular event such as heart attack, stroke, or embolism /  thrombosis while off blood thinner. The patient consents to proceed. Will communicate by phone or EMR with patient's prescribing provider to confirm that  holding Eliquis is reasonable in this case.   # Lung nodule. Scheduled for another chest CT scan in April.   # Mildly abnormal ANA, ANCA. Pulmonary has referred him to Rheumatology  HISTORY OF PRESENT ILLNESS     Chief Complaint : cough  Charles Wright is a 85 y.o. male with multiple medical problems not limited to  prostate cancer s/p radiation, radiation proctitis, bladder cancer, CAD with non-STEMI in 2016 not amenable to PCI, hyperlipidemia, hypertension, atrial fibrillation , hx of TIA / stroke,  OSA on CPAP, COVID19  Patient referred by Pulmonologist for GERD. He has a chronic, intermititent non-productive cough. The cough started after becoming ill following a conference in [place] in January 2020 with > 30K people.  The cough frequently occurs at night but other times as well . He occasionally coughs during meals.  He denies dysphagia. He is not aware of postnasal drip.  Patient denies a history of frequent GERD symptoms including heartburn or acid reflux.  He tried a PPI for a month without any improvement in cough.  He is frustrated about the lack of answers regarding his cough.  He feels like the problem originates from his trachea. .      Patient recently diagnosed with interstitial lung disease.    Additionally, patient has intermittent chest pain radiating upward and towards right collarbone.  He cannot correlate the chest pain with eating or physical activity.  Episodes started 6 months ago, last about an hour each time. He has CAD and followed by Dr.  Hilty. He has CAD but is being managed medically.  .   Data Reviewed: December 2020 colonoscopy with [facility].  --Full report not in Epic but post-op diagnosis was 3 transverse colon polyps and radiation proctitis.   PREVIOUS EVALUATIONS:    Past Medical History:  Diagnosis Date  . Acute gout of left ankle    06-14-2015  . Bladder cancer (HCC)    BCG's tx's  . BPH (benign prostatic hypertrophy)   . CAD (coronary artery  disease) CARDIOLOGIST-  DR TILLEY   a. NSTEMI 12/2014 -  99% dLAD-2 (not amenable to PCI), 50% dLAD-1, 60% mLAD. Medical therapy was recommended.  . Cancer of kidney ([place])    left  . Cough    HARSH NONPRODUCTIVE COUGH 1 AND 1/2 WEEKS  . GERD (gastroesophageal reflux disease)    takes  OTC periodically  . Gilbert's syndrome   . H/O cardiac catheterization    a. Note: Difficult radial access in 12/2014 - recommend femoral approach if cath needed in the future.  Marland Kitchen History of kidney stones   . History of non-ST elevation myocardial infarction (NSTEMI)    12-12-2014   CARDIAC CATH W/ NO INTERVENTION X 2FEW DAYS APART  . History of spinal fracture 2002   LUMBAR  . Hyperlipidemia   . Hypertension   . Hypothyroidism   . Myocardial infarction ([place])   . OSA on CPAP    SET ON 7 USES SOME NIGHTS  . Paroxysmal A-fib [facility])    cardiologist-  dr Wynonia Lawman  . Prostate cancer ([place]) 2019   last PSA  2.9  (montiored by urologist  dr Junious Silk) [place] FEB 2019 RAD DONE  . Shingles    2 weeks ago  . Wears glasses      Past Surgical History:  Procedure Laterality Date  . BACK SURGERY  2014   LOWER l 1, 2 4 AND 5  . BILATERAL INGUINAL HERNIA REPAIR    . CARDIAC CATHETERIZATION N/A 12/13/2014   Procedure: Left Heart Cath and Coronary Angiography;  Surgeon: Leonie Man, MD;  Location: [facility];  Service: Cardiovascular;  Laterality: N/A;  culprit lesion diat LAD-2  99%, not approachable via PCTA  due to extremely tortuous up stream LAD/  mLAD 60% and dLAD-1 50%, both are at the extremely tortuous segment and not PCI targets/  otherwise normal coronary arteries  . COLONOSCOPY WITH PROPOFOL N/A 01/16/2019   Procedure: COLONOSCOPY WITH PROPOFOL;  Surgeon: Clarene Essex, MD;  Location: [facility];  Service: Endoscopy;  Laterality: N/A;  . CYSTOSCOPY W/ RETROGRADES Left 08/22/2012   Procedure: CYSTOSCOPY WITH RETROGRADE PYELOGRAM;  left kidney washings;  Surgeon: Fredricka Bonine, MD;   Location: [facility];  Service: Urology;  Laterality: Left;  . CYSTOSCOPY W/ RETROGRADES N/A 07/08/2015   Procedure: CYSTOSCOPY WITH RETROGRADE PYELOGRAM;  Surgeon: [name], MD;  Location[facility];  Service: Urology;  Laterality: N/A;  . CYSTOSCOPY W/ URETERAL STENT PLACEMENT Left 03/24/2019   Procedure: CYSTOSCOPY WITH RETROGRADE PYELOGRAM/URETERAL STENT PLACEMENT ureteroscopy biopsy of neoplasm;  Surgeon: [name], MD;  Location: [facility];  Service: Urology;  Laterality: Left;  . CYSTOSCOPY W/ URETERAL STENT PLACEMENT Left 04/08/2019   Procedure: CYSTOSCOPY cystogram clot evacuation left ureteroscopy clot evacuation left stent exchange liimited transuretheral resectio of prastate and fulguration  bleeders of prostate;  Surgeon: [name], MD;  Location: [facility];  Service: Urology;  Laterality: Left;  . CYSTOSCOPY WITH BIOPSY  08/22/2012  Procedure: CYSTOSCOPY WITH BIOPSY;  Surgeon: Fredricka Bonine, MD;  [facility];  Service: Urology;;  . CYSTOSCOPY WITH BIOPSY N/A 11/08/2014   Procedure: CYSTOSCOPY/BIOPSPY BLADDER INSTILLATION OF MARCAINE AND PYRIDIUM;  Surgeon: [name], MD;  Location: [facility];  Service: Urology;  Laterality: N/A;  . CYSTOSCOPY WITH BIOPSY N/A 07/08/2015   Procedure: CYSTOSCOPY WITH BLADDER BIOPSY, FULGURATION, RETROGRADE PYLOGRAM AND RENAL WASHING;  Surgeon: [name], MD;  Location: [facility];  Service: Urology;  Laterality: N/A;  . CYSTOSCOPY WITH RETROGRADE PYELOGRAM, URETEROSCOPY AND STENT PLACEMENT Bilateral 07/30/2014   Procedure: CYSTOSCOPY WITH BLADDER BX FULERGATION AND BILATERAL RETROGRADE PYELOGRAM,;  Surgeon: [name], MD;  [facility];  Service: Urology;  Laterality: Bilateral;  . CYSTOSCOPY WITH STENT PLACEMENT Left 03/24/2018   Procedure: STENT PLACEMENT AND BIOPSY;  Surgeon: [name], MD;  Location: [facility];  Service: Urology;  Laterality: Left;  . CYSTOSCOPY/RETROGRADE/URETEROSCOPY Bilateral 08/17/2013   Procedure: CYSTOSCOPY, BLADDER BIOPSY WITH BILATERAL RETROGRADE PYELOGRAM, LEFT URETEROSCOPY AND DILATION OF STRICTURE ;  Surgeon: [name], MD;  Location[facility];  Service: Urology;  Laterality: Bilateral;  . CYSTOSCOPY/RETROGRADE/URETEROSCOPY Left 03/24/2018   Procedure: CYSTOSCOPY/RETROGRADE/URETEROSCOPY;  Surgeon: [name], MD;  Location: [facility];  Service: Urology;  Laterality: Left;  . HOT HEMOSTASIS N/A 01/16/2019   Procedure: HOT HEMOSTASIS (ARGON PLASMA COAGULATION/BICAP);  Surgeon: Clarene Essex, MD;  Location: [facility];  Service: Endoscopy;  Laterality: N/A;  . LEFT ATRIAL APPENDAGE OCCLUSION N/A 01/09/2015   Procedure: LEFT ATRIAL APPENDAGE OCCLUSION;  Surgeon: [name], MD;  Location: [facility];  Service: Cardiovascular;  Laterality: N/A;  . LUMBAR LAMINECTOMY/DECOMPRESSION MICRODISCECTOMY Left 12/03/2013   Procedure: Left Lumbar three-four diskectomy with Lumbar four laminectomy ;  Surgeon: Newman Pies, MD;  Location: [facility];  Service: Neurosurgery;  Laterality: Left;  Left Lumbar three-four diskectomy with Lumbar four laminectomy   . LYMPH NODE DISSECTION Bilateral 04/13/2019   Procedure: LYMPH NODE DISSECTION;  Surgeon: Alexis Frock, MD;  Location: [facility];  Service: Urology;  Laterality: Bilateral;  . ORIF LEFT ANKLE FX  2005  . POLYPECTOMY  01/16/2019   Procedure: POLYPECTOMY;  Surgeon: Clarene Essex, MD;  Location: [facility];  Service: Endoscopy;;  . PROSTATE BIOPSY N/A 08/22/2012   Procedure: BIOPSY TRANSRECTAL ULTRASONIC PROSTATE (TUBP);  Surgeon: Fredricka Bonine, MD;  Location: [facility];  Service: Urology;  Laterality: N/A;  . ROBOT ASSITED LAPAROSCOPIC NEPHROURETERECTOMY Left 04/13/2019   Procedure: XI ROBOT ASSITED LAPAROSCOPIC NEPHROURETERECTOMY;  Surgeon:  Alexis Frock, MD;  Location: [facility];  Service: Urology;  Laterality: Left;  3 HRS  . TEE WITHOUT CARDIOVERSION N/A 12/31/2014   Procedure: TRANSESOPHAGEAL ECHOCARDIOGRAM (TEE);  Surgeon: Pixie Casino, MD;  Location: [facility];  Service: Cardiovascular;  Laterality: N/A;  ef 55-60%/  mild MR, TR, and PR/  mild LAE and RAE  . TRANSURETHRAL RESECTION OF BLADDER TUMOR  10/22/2011   Procedure: TRANSURETHRAL RESECTION OF BLADDER TUMOR (TURBT);  Surgeon: Fredricka Bonine, MD;  Location: [facility];  Service: Urology;  Laterality: N/A;  TURBT, LEFT URETEROSCOPY, POSSIBLE URETERAL STENT   . URETEROSCOPY  10/22/2011   Procedure: URETEROSCOPY;  Surgeon: Fredricka Bonine, MD;  Location: [facility];  Service: Urology;  Laterality: Left;  . WISDOM TOOTH EXTRACTION     Family History  Problem Relation Age of Onset  . Leukemia Mother   . Heart attack Father   .  Cancer Sister        breast  . Hypertension Neg Hx   . Stroke Neg Hx   . Colon cancer Neg Hx   . Esophageal cancer Neg Hx    Social History   Tobacco Use  . Smoking status: Never Smoker  . Smokeless tobacco: Never Used  Vaping Use  . Vaping Use: Never used  Substance Use Topics  . Alcohol use: Yes    Alcohol/week: 7.0 standard drinks    Types: 7 Glasses of wine per week    Comment: red wine glass daily or shot of whiskey  . Drug use: No   Current Outpatient Medications  Medication Sig Dispense Refill  . acetaminophen (TYLENOL) 325 MG tablet 2 tablets    . allopurinol (ZYLOPRIM) 300 MG tablet Take 300 mg by mouth every morning.     Marland Kitchen apixaban (ELIQUIS) 2.5 MG TABS tablet Take 1 tablet (2.5 mg total) by mouth 2 (two) times daily. 180 tablet 1  . aspirin EC 81 MG tablet Take 81 mg by mouth daily.    . benzonatate (TESSALON) 100 MG capsule As needed for cough    . Cholecalciferol (VITAMIN D3) 125 MCG (5000 UT) TABS 1 tablet    . doxycycline (MONODOX) 50 MG capsule Take 50 mg by mouth  as needed.    Marland Kitchen EPINEPHrine 0.3 mg/0.3 mL IJ SOAJ injection Inject 0.3 mLs (0.3 mg total) into the muscle once. 2 Device 1  . levothyroxine (SYNTHROID, LEVOTHROID) 50 MCG tablet Take 50 mcg by mouth daily before breakfast.    . nitroGLYCERIN (NITROSTAT) 0.4 MG SL tablet Place 1 tablet (0.4 mg total) under the tongue every 5 (five) minutes x 3 doses as needed for chest pain. 25 tablet 12  . pravastatin (PRAVACHOL) 40 MG tablet Take 1 tablet (40 mg total) by mouth daily. 30 tablet   . Testosterone 1.62 % GEL Apply 2 Pump topically every other day.     . metoprolol tartrate (LOPRESSOR) 25 MG tablet Take 1 tablet (25 mg total) by mouth 2 (two) times daily. 180 tablet 3   No current facility-administered medications for this visit.   Allergies  Allergen Reactions  . Bee Venom Anaphylaxis    Actually Hornets and Wasps.   . Losartan Other (See Comments)    Cough   . Oxycodone Other (See Comments)    ALTERED MENTAL STATUS GETS MEAN     Review of Systems: Positive for dizziness with position changes . All other systems reviewed and negative except where noted in HPI.   PHYSICAL EXAM :    Wt Readings from Last 3 Encounters:  04/22/20 196 lb (88.9 kg)  04/18/20 196 lb 12.8 oz (89.3 kg)  03/07/20 202 lb (91.6 kg)    BP 120/64   Pulse 88   Ht 5\' 7"  (1.702 m)   Wt 196 lb (88.9 kg)   BMI 30.70 kg/m  Constitutional:  Pleasant well developed male in no acute distress. Psychiatric: Normal mood and affect. Behavior is normal. EENT: Pupils normal.  Conjunctivae are normal. No scleral icterus. Neck supple.  Cardiovascular: Normal rate.  No edema Pulmonary/chest: Effort normal and breath sounds normal. No wheezing, rales or rhonchi. Abdominal: Soft, nondistended, nontender. Bowel sounds active throughout. There are no masses palpable. No hepatomegaly. Neurological: Alert and oriented to person place and time. Skin: Skin is warm and dry. No rashes noted.  Tye Savoy, NP  04/22/2020, 2:37  PM  Cc:  Referring Provider Marshell Garfinkel, MD

## 2020-04-22 NOTE — Patient Instructions (Addendum)
If you are age 83 or older, your body mass index should be between 23-30. Your Body mass index is 30.7 kg/m. If this is out of the aforementioned range listed, please consider follow up with your Primary Care Provider.  If you are age 82 or younger, your body mass index should be between 19-25. Your Body mass index is 30.7 kg/m. If this is out of the aformentioned range listed, please consider follow up with your Primary Care Provider.   You have been scheduled for an endoscopy. Please follow written instructions given to you at your visit today. If you use inhalers (even only as needed), please bring them with you on the day of your procedure.  Due to the COVID-19 restrictions implemented by our local and state authorities and in an effort to keep both patients and staff as safe as possible, our hospital system requires COVID-19 testing prior to any scheduled hospital procedure. Failure to have your COVID-19 test done on the date and time you have been scheduled will result in cancellation of procedure. Please go to [address], [place], [place] [phone] on Thursday 05/08/20 at  10 am. This is a drive up testing site, you will not need to exit your vehicle.  You will not be billed at the time of testing but may receive a bill later depending on your insurance. The approximate cost of the test is $100. You must agree to quarantine from the time of your testing until the procedure date on Monday 05/12/20 . This should include staying at home with ONLY the people you live with. Avoid take-out, grocery store shopping or leaving the house for any non-emergent reason.  Please call our office at [phone] if you have any questions.    You have been scheduled for an esophageal manometry and 24 hour PH Probe test at [facility] on Monday 05/12/20 at 10:30 am. Please arrive 30 minutes prior to your procedure for registration. You will need to go to outpatient registration (1st floor of the  hospital) first. Make certain to bring your insurance cards as well as a complete list of medications.  Please remember the following:  1) Do not take any muscle relaxants, xanax (alprazolam) or ativan for 1 day prior to your test as well as the day of the test.  2) Nothing to eat or drink after 12:00 midnight on the night before your test.  3) Hold all diabetic medications/insulin the morning of the test. You may eat and take your medications after the test.  4) For 7 days prior to your test do not take: Dexilant, Prevacid, Nexium, Protonix, Aciphex, Zegerid, Pantoprazole, Prilosec or omeprazole.  5) For 7 days prior to your test, do not take: Reglan, Tagamet, Zantac, Axid or Pepcid.  6) You MAY use an antacid such as Rolaids or Tums up to 12 hours prior to your test.  It will take at least 2 weeks to receive the results of this test from your physician.  ------------------------------------------ ABOUT ESOPHAGEAL MANOMETRY Esophageal manometry (muh-NOM-uh-tree) is a test that gauges how well your esophagus works. Your esophagus is the long, muscular tube that connects your throat to your stomach. Esophageal manometry measures the rhythmic muscle contractions (peristalsis) that occur in your esophagus when you swallow. Esophageal manometry also measures the coordination and force exerted by the muscles of your esophagus.  During esophageal manometry, a thin, flexible tube (catheter) that contains sensors is passed through your nose, down your esophagus and into your stomach.  Esophageal manometry can be helpful in diagnosing some mostly uncommon disorders that affect your esophagus.  Why it's done Esophageal manometry is used to evaluate the movement (motility) of food through the esophagus and into the stomach. The test measures how well the circular bands of muscle (sphincters) at the top and bottom of your esophagus open and close, as well as the pressure, strength and pattern of the wave  of esophageal muscle contractions that moves food along.  What you can expect Esophageal manometry is an outpatient procedure done without sedation. Most people tolerate it well. You may be asked to change into a hospital gown before the test starts.  During esophageal manometry  . While you are sitting up, a member of your health care team sprays your throat with a numbing medication or puts numbing gel in your nose or both.  . A catheter is guided through your nose into your esophagus. The catheter may be sheathed in a water-filled sleeve. It doesn't interfere with your breathing. However, your eyes may water, and you may gag. You may have a slight nosebleed from irritation.  . After the catheter is in place, you may be asked to lie on your back on an exam table, or you may be asked to remain seated.  . You then swallow small sips of water. As you do, a computer connected to the catheter records the pressure, strength and pattern of your esophageal muscle contractions.  . During the test, you'll be asked to breathe slowly and smoothly, remain as still as possible, and swallow only when you're asked to do so.  . A member of your health care team may move the catheter down into your stomach while the catheter continues its measurements.  . The catheter then is slowly withdrawn. The test usually lasts 20 to 30 minutes.  After esophageal manometry  When your esophageal manometry is complete, you may return to your normal activities  This test typically takes 30-45 minutes to complete. ________________________________________________________________________________  ABOUT 24 HOUR PH PROBE An esophageal pH test measures and records the pH in your esophagus to determine if you have gastroesophageal reflux disease (GERD). The test can also be done to determine the effectiveness of medications or surgical treatment for GERD. What is esophageal reflux? Esophageal reflux is a condition in which stomach  acid refluxes or moves back into the esophagus (the "food pipe" leading from the mouth to the stomach). How does the esophageal pH test work? A thin, small tube with an acid sensing device on the tip is gently passed through your nose, down the esophagus ("food tube"), and positioned about 2 inches above the lower esophageal sphincter. The tube is secured to the side of your face with clear tape. The end of the tube exiting from your nose is attached to a portable recorder that is worn on your belt or over your shoulder. The recorder has several buttons on it that you will press to mark certain events. A nurse will review the monitoring instructions with you. Once the test has begun, what do I need to know and do? Marland Kitchen Activity: Follow your usual daily routine. Do not reduce or change your activities during the monitoring period. Doing so can make the monitoring results less useful.  . Note: do not take a tub bath or shower; the equipment can't get wet.  . Eating: Eat your regular meals at the usual times. If you do not eat during the monitoring period, your stomach will not produce  acid as usual, and the test results will not be accurate. Eat at least 2 meals a day. Eat foods that tend to increase your symptoms (without making yourself miserable). Avoid snacking. Do not suck on hard candy or lozenges and do not chew gum during the monitoring period.  . Lying down: Remain upright throughout the day. Do not lie down until you go to bed (unless napping or lying down during the day is part of your daily routine).  . Medications: Continue to follow your doctor's advice regarding medications to avoid during the monitoring period.  . Recording symptoms: Press the appropriate button on your recorder when symptoms occur (as discussed with the nurse).  . Recording events: Record the time you start and stop eating and drinking (anything other than plain water). Record the time you lie down (even if just resting) and  when you get back up. The nurse will explain this.  . Unusual symptoms or side effects. If you think you may be experiencing any unusual symptoms or side effects, call your doctor.  You will return the next day to have the tube removed. The information on the recorder will be downloaded to a computer and the results will be analyzed.  After completion of the study Resume your normal diet and medications. Lozenges or hard candy may help ease any sore throat caused by the tube.   Due to recent changes in healthcare laws, you may see the results of your imaging and laboratory studies on MyChart before your provider has had a chance to review them.  We understand that in some cases there may be results that are confusing or concerning to you. Not all laboratory results come back in the same time frame and the provider may be waiting for multiple results in order to interpret others.  Please give [place] 48 hours in order for your provider to thoroughly review all the results before contacting the office for clarification of your results.

## 2020-04-22 NOTE — Telephone Encounter (Signed)
Pharmacy please comment on anticoagulation and then will contact the patient.  Kerin Ransom PA-C 04/22/2020 3:54 PM

## 2020-04-22 NOTE — Telephone Encounter (Signed)
Patient with diagnosis of atrial fibrillation on Eliquis for anticoagulation.    Procedure: Endoscopy Date of procedure: 05/09/20  CHA2DS2-VASc Score = 6  This indicates a 9.7% annual risk of stroke. The patient's score is based upon: CHF History: No HTN History: Yes Diabetes History: No Stroke History: Yes Vascular Disease History: Yes Age Score: 2 Gender Score: 0    CrCl 40 ml/min Platelet count 334K  Per office protocol, patient can hold Eliquis for 1 day prior to procedure due to patients elevated cardiovascular risk.   Resume Eliquis as safely possible at the discretion of the MD provider

## 2020-04-22 NOTE — Telephone Encounter (Signed)
[facility] Pre-operative Risk Assessment     Request for surgical clearance:     Endoscopy Procedure  What type of surgery is being performed?     Endoscopy  When is this surgery scheduled?     Friday 05/09/20  What type of clearance is required ?   Pharmacy  Are there any medications that need to be held prior to surgery and how long? Eliquis 2 days  Practice name and name of physician performing surgery?      [facility]  What is your office phone and fax number?      Phone- [phone]  [phone]  Anesthesia type (None, local, MAC, general) ?       MAC

## 2020-04-23 NOTE — Telephone Encounter (Signed)
   Primary Cardiologist: Pixie Casino, MD  Chart reviewed as part of pre-operative protocol coverage.  Per pharmacy recommendations, patient can hold eliquis 1 day prior to his upcoming endoscopy with plans to restart as soon as he is cleared to do so by his gastroenterologist.  I will route this recommendation to the requesting party via [place] fax function and remove from pre-op pool.  Please call with questions.  Abigail Butts, PA-C 04/23/2020, 12:19 PM

## 2020-04-23 NOTE — Telephone Encounter (Signed)
Dr. Silverio Decamp are you okay with patient only holding Eliquis 1 day instead of 2 days?

## 2020-04-23 NOTE — Telephone Encounter (Signed)
Patient informed to hold Eliquis for 1 day and voiced understanding.

## 2020-04-23 NOTE — Telephone Encounter (Signed)
Its fine, hold Eliquis for 1 day prior to procedure. Thanks

## 2020-04-24 NOTE — Progress Notes (Signed)
Office Visit Note  Patient: Charles Wright             Date of Birth: [birth year]           MRN: [id number]             PCP: Maury Dus, MD Referring: Marshell Garfinkel, MD Visit Date: 05/07/2020 Occupation: @GUAROCC @  Subjective:  Positive ANA.   History of Present Illness: Charles Wright is a 85 y.o. male seen in consultation per request of Dr. Vaughan Browner for interstitial lung disease and positive ANA.  According the patient in January 2020 he went to a convention in [place].  When he came back he had upper respiratory tract infection and a cough.  The infection resolved but he had persistent dry cough.  He was seen by Dr. Vaughan Browner and had a chest x-ray and high-resolution CT scan which showed honeycombing and some fibrosis.  He also had PFTs done.  In February 2022 he developed COVID-19 infection but did not require any treatment.  Dr. Vaughan Browner obtain some lab work which showed positive ANA and positive p-ANCA.  He denies any history of oral ulcers, nasal ulcers, malar rash, photosensitivity, Raynaud's phenomenon, inflammatory arthritis or lymphadenopathy.  He gives history of dry mouth and dry eyes.  He also gives history of gout which was diagnosed about 5 years ago and has been on treatment with allopurinol.  He denies having any symptoms of gout in the last 2 to 3 years.  He states for the last few days he has been working with a chainsaw and he has been having some discomfort in his arm muscles.  But he denies any joint pain or joint swelling.  He has no difficulty getting up from the chair.  There is no family history of autoimmune disease.  Activities of Daily Living:  Patient reports morning stiffness for 0 minutes.    Patient Denies nocturnal pain.  Difficulty dressing/grooming: Denies Difficulty climbing stairs: Denies Difficulty getting out of chair: Denies Difficulty using hands for taps, buttons, cutlery, and/or writing: Denies  Review of Systems  Constitutional: Negative for fatigue  and night sweats.  HENT: Positive for mouth dryness and nose dryness. Negative for mouth sores.   Eyes: Positive for dryness. Negative for pain, redness and itching.  Respiratory: Positive for shortness of breath. Negative for difficulty breathing.   Cardiovascular: Negative for chest pain, palpitations, hypertension, irregular heartbeat and swelling in legs/feet.  Gastrointestinal: Negative for blood in stool, constipation and diarrhea.  Endocrine: Negative for increased urination.  Genitourinary: Negative for difficulty urinating.  Musculoskeletal: Positive for arthralgias and joint pain. Negative for joint swelling, myalgias, muscle weakness, morning stiffness, muscle tenderness and myalgias.  Skin: Negative for color change, rash, hair loss, nodules/bumps, redness, skin tightness, ulcers and sensitivity to sunlight.  Allergic/Immunologic: Negative for susceptible to infections.  Neurological: Negative for dizziness, fainting, numbness, headaches, memory loss, night sweats and weakness.  Hematological: Negative for bruising/bleeding tendency and swollen glands.  Psychiatric/Behavioral: Negative for depressed mood, confusion and sleep disturbance. The patient is not nervous/anxious.     PMFS History:  Patient Active Problem List   Diagnosis Date Noted  . Renal mass 04/13/2019  . Gross hematuria 04/08/2019  . Hyperlipidemia 12/14/2014  . CAD (coronary artery disease), native coronary artery   . Paroxysmal atrial fibrillation ([place]) 08/13/2014  . Long term current use of anticoagulant therapy 08/13/2014  . Obesity (BMI 30-39.9) 08/13/2014  . Hypertensive heart disease   . Sleep apnea   .  Bladder cancer ([place])   . Prostate cancer ([place])   . Lumbar stenosis with neurogenic claudication 12/03/2013  . Upper airway cough syndrome 01/27/2013    Past Medical History:  Diagnosis Date  . Acute gout of left ankle    06-14-2015  . Bladder cancer (HCC)    BCG's tx's  . BPH (benign prostatic  hypertrophy)   . CAD (coronary artery disease) CARDIOLOGIST-  DR TILLEY   a. NSTEMI 12/2014 -  99% dLAD-2 (not amenable to PCI), 50% dLAD-1, 60% mLAD. Medical therapy was recommended.  . Cancer of kidney ([place])    left  . Cough    HARSH NONPRODUCTIVE COUGH 1 AND 1/2 WEEKS  . GERD (gastroesophageal reflux disease)    takes  OTC periodically  . Gilbert's syndrome   . H/O cardiac catheterization    a. Note: Difficult radial access in 12/2014 - recommend femoral approach if cath needed in the future.  Marland Kitchen History of kidney stones   . History of non-ST elevation myocardial infarction (NSTEMI)    12-12-2014   CARDIAC CATH W/ NO INTERVENTION X 2FEW DAYS APART  . History of spinal fracture 2002   LUMBAR  . Hyperlipidemia   . Hypertension   . Hypothyroidism   . Myocardial infarction ([place])   . OSA on CPAP    SET ON 7 USES SOME NIGHTS  . Paroxysmal A-fib [facility])    cardiologist-  dr Wynonia Lawman  . Prostate cancer ([place]) 2019   last PSA  2.9  (montiored by urologist  dr Junious Silk) [place] FEB 2019 RAD DONE  . Shingles    2 weeks ago  . Wears glasses     Family History  Problem Relation Age of Onset  . Leukemia Mother   . Heart attack Father   . Cancer Sister        breast  . Hypertension Neg Hx   . Stroke Neg Hx   . Colon cancer Neg Hx   . Esophageal cancer Neg Hx    Past Surgical History:  Procedure Laterality Date  . BACK SURGERY  2014   LOWER l 1, 2 4 AND 5  . BILATERAL INGUINAL HERNIA REPAIR    . CARDIAC CATHETERIZATION N/A 12/13/2014   Procedure: Left Heart Cath and Coronary Angiography;  Surgeon: Leonie Man, MD;  Location: [facility];  Service: Cardiovascular;  Laterality: N/A;  culprit lesion diat LAD-2  99%, not approachable via PCTA  due to extremely tortuous up stream LAD/  mLAD 60% and dLAD-1 50%, both are at the extremely tortuous segment and not PCI targets/  otherwise normal coronary arteries  . COLONOSCOPY WITH PROPOFOL N/A 01/16/2019   Procedure: COLONOSCOPY WITH  PROPOFOL;  Surgeon: Clarene Essex, MD;  [facility]: [facility];  Service: Endoscopy;  Laterality: N/A;  . CYSTOSCOPY W/ RETROGRADES Left 08/22/2012   Procedure: CYSTOSCOPY WITH RETROGRADE PYELOGRAM;  left kidney washings;  Surgeon: Fredricka Bonine, MD;  Location[facility];  Service: Urology;  Laterality: Left;  . CYSTOSCOPY W/ RETROGRADES N/A 07/08/2015   Procedure: CYSTOSCOPY WITH RETROGRADE PYELOGRAM;  Surgeon: [name], MD;  Location: [facility];  Service: Urology;  Laterality: N/A;  . CYSTOSCOPY W/ URETERAL STENT PLACEMENT Left 03/24/2019   Procedure: CYSTOSCOPY WITH RETROGRADE PYELOGRAM/URETERAL STENT PLACEMENT ureteroscopy biopsy of neoplasm;  Surgeon: [name], MD;  [facility];  Service: Urology;  Laterality: Left;  . CYSTOSCOPY W/ URETERAL STENT PLACEMENT Left 04/08/2019   Procedure: CYSTOSCOPY cystogram clot evacuation left  ureteroscopy clot evacuation left stent exchange liimited transuretheral resectio of prastate and fulguration  bleeders of prostate;  Surgeon: [name], MD;  [facility];  Service: Urology;  Laterality: Left;  . CYSTOSCOPY WITH BIOPSY  08/22/2012   Procedure: CYSTOSCOPY WITH BIOPSY;  Surgeon: Fredricka Bonine, MD;  Location: [facility];  Service: Urology;;  . [name] WITH BIOPSY N/A 11/08/2014   Procedure: CYSTOSCOPY/BIOPSPY BLADDER INSTILLATION OF MARCAINE AND PYRIDIUM;  Surgeon: [name], MD;  Location: [facility];  Service: Urology;  Laterality: N/A;  . CYSTOSCOPY WITH BIOPSY N/A 07/08/2015   Procedure: CYSTOSCOPY WITH BLADDER BIOPSY, FULGURATION, RETROGRADE PYLOGRAM AND RENAL WASHING;  Surgeon: [name], MD;  Location[facility];  Service: Urology;  Laterality: N/A;  . CYSTOSCOPY WITH RETROGRADE PYELOGRAM, URETEROSCOPY AND STENT PLACEMENT Bilateral 07/30/2014   Procedure: CYSTOSCOPY WITH BLADDER BX FULERGATION AND BILATERAL  RETROGRADE PYELOGRAM,;  Surgeon: [name], MD;  [facility];  Service: Urology;  Laterality: Bilateral;  . CYSTOSCOPY WITH STENT PLACEMENT Left 03/24/2018   Procedure: STENT PLACEMENT AND BIOPSY;  Surgeon: [name], MD;  Location[facility];  Service: Urology;  Laterality: Left;  . CYSTOSCOPY/RETROGRADE/URETEROSCOPY Bilateral 08/17/2013   Procedure: CYSTOSCOPY, BLADDER BIOPSY WITH BILATERAL RETROGRADE PYELOGRAM, LEFT URETEROSCOPY AND DILATION OF STRICTURE ;  Surgeon: [name], MD;  Location[facility];  Service: Urology;  Laterality: Bilateral;  . CYSTOSCOPY/RETROGRADE/URETEROSCOPY Left 03/24/2018   Procedure: CYSTOSCOPY/RETROGRADE/URETEROSCOPY;  Surgeon: [name], MD;  Location: [facility];  Service: Urology;  Laterality: Left;  . HOT HEMOSTASIS N/A 01/16/2019   Procedure: HOT HEMOSTASIS (ARGON PLASMA COAGULATION/BICAP);  Surgeon: Clarene Essex, MD;  Location: [facility];  Service: Endoscopy;  Laterality: N/A;  . LEFT ATRIAL APPENDAGE OCCLUSION N/A 01/09/2015   Procedure: LEFT ATRIAL APPENDAGE OCCLUSION;  Surgeon: [name], MD;  Location: [facility];  Service: Cardiovascular;  Laterality: N/A;  . LUMBAR LAMINECTOMY/DECOMPRESSION MICRODISCECTOMY Left 12/03/2013   Procedure: Left Lumbar three-four diskectomy with Lumbar four laminectomy ;  Surgeon: Newman Pies, MD;  Location: [facility];  Service: Neurosurgery;  Laterality: Left;  Left Lumbar three-four diskectomy with Lumbar four laminectomy   . LYMPH NODE DISSECTION Bilateral 04/13/2019   Procedure: LYMPH NODE DISSECTION;  Surgeon: Alexis Frock, MD;  [facility];  Service: Urology;  Laterality: Bilateral;  . ORIF LEFT ANKLE FX  2005  . POLYPECTOMY  01/16/2019   Procedure: POLYPECTOMY;  Surgeon: Clarene Essex, MD;  [facility];  Service: Endoscopy;;  . PROSTATE BIOPSY N/A 08/22/2012   Procedure: BIOPSY TRANSRECTAL ULTRASONIC PROSTATE  (TUBP);  Surgeon: Fredricka Bonine, MD;  Location[facility];  Service: Urology;  Laterality: N/A;  . ROBOT ASSITED LAPAROSCOPIC NEPHROURETERECTOMY Left 04/13/2019   Procedure: XI ROBOT ASSITED LAPAROSCOPIC NEPHROURETERECTOMY;  Surgeon: Alexis Frock, MD;  [facility];  Service: Urology;  Laterality: Left;  3 HRS  . TEE WITHOUT CARDIOVERSION N/A 12/31/2014   Procedure: TRANSESOPHAGEAL ECHOCARDIOGRAM (TEE);  Surgeon: Pixie Casino, MD;  Location: [facility];  Service: Cardiovascular;  Laterality: N/A;  ef 55-60%/  mild MR, TR, and PR/  mild LAE and RAE  . TRANSURETHRAL RESECTION OF BLADDER TUMOR  10/22/2011   Procedure: TRANSURETHRAL RESECTION OF BLADDER TUMOR (TURBT);  Surgeon: Fredricka Bonine, MD;  Location: [facility];  Service: Urology;  Laterality: N/A;  TURBT, LEFT URETEROSCOPY, POSSIBLE URETERAL STENT   . URETEROSCOPY  10/22/2011   Procedure: URETEROSCOPY;  Surgeon: Fredricka Bonine, MD;  Location: [facility];  Service: Urology;  Laterality: Left;  . WISDOM TOOTH EXTRACTION     Social History   Social History Narrative  . Not on file   Immunization History  Administered Date(s) Administered  . Fluad Quad(high Dose 65+) 12/26/2019  . Hepatitis A, Adult 07/31/1998, 03/09/1999  . Hepatitis B, ped/adol 07/31/1998, 09/08/1998, 03/09/1999  . IPV 04/03/2004  . Influenza Split 12/15/2007, 10/30/2009, 12/09/2012, 11/08/2016, 12/15/2017  . Influenza, High Dose Seasonal PF 11/08/2013, 11/10/2015, 12/15/2017, 11/15/2018, 11/08/2019  . Influenza,inj,Quad PF,6+ Mos 10/28/2014  . Meningococcal Conjugate 04/03/2004  . PFIZER(Purple Top)SARS-COV-2 Vaccination 04/05/2019, 04/25/2019, 04/07/2020  . Pneumococcal Conjugate-13 03/21/2014  . Pneumococcal Polysaccharide-23 03/12/2003  . Pneumococcal-Unspecified 03/12/2003  . Td 12/15/2007  . Tdap 03/21/2014  . Yellow Fever 07/31/1998  . Zoster 02/09/2007      Objective: Vital Signs: BP 119/77 (BP Location: Right Arm, Patient Position: Sitting, Cuff Size: Normal)   Pulse 77   Resp 15   Ht 5\' 7"  (1.702 m)   Wt 190 lb (86.2 kg)   BMI 29.76 kg/m    Physical Exam Vitals and nursing note reviewed.  Constitutional:      Appearance: He is well-developed.  HENT:     Head: Normocephalic and atraumatic.  Eyes:     Conjunctiva/sclera: Conjunctivae normal.     Pupils: Pupils are equal, round, and reactive to light.  Cardiovascular:     Rate and Rhythm: Normal rate and regular rhythm.     Heart sounds: Normal heart sounds.  Pulmonary:     Effort: Pulmonary effort is normal.     Breath sounds: Rales present.     Comments: Bilateral crackles were heard in the lung bases. Abdominal:     General: Bowel sounds are normal.     Palpations: Abdomen is soft.  Musculoskeletal:     Cervical back: Normal range of motion and neck supple.  Skin:    General: Skin is warm and dry.     Capillary Refill: Capillary refill takes less than 2 seconds.     Comments: No sclerodactyly or nailbed capillary changes were noted.  Neurological:     Mental Status: He is alert and oriented to person, place, and time.  Psychiatric:        Behavior: Behavior normal.      Musculoskeletal Exam: C-spine was in good range of motion.  Thoracic kyphosis.  Shoulder joints, elbow joints, wrist joints, MCPs PIPs and DIPs with good range of motion with no synovitis.  Hip joints, knee joints, ankles, MTPs and PIPs with good range of motion with no synovitis.  CDAI Exam: CDAI Score: -- Patient Global: --; Provider Global: -- Swollen: --; Tender: -- Joint Exam 05/07/2020   No joint exam has been documented for this visit   There is currently no information documented on the homunculus. Go to the Rheumatology activity and complete the homunculus joint exam.  Investigation: No additional findings.  Imaging: DG Chest 2 View  Result Date: 04/10/2020 CLINICAL DATA:  Chronic  cough for 2 years post COVID, history of renal and bladder cancer EXAM: CHEST - 2 VIEW COMPARISON:  10/23/2019 chest radiograph. FINDINGS: Stable cardiomediastinal silhouette with normal heart size. No pneumothorax. No pleural effusion. No pulmonary edema. No acute consolidative airspace disease. Patchy reticular opacities in the peripheral lungs bilaterally, not appreciably changed. IMPRESSION: Stable patchy reticular opacities in the peripheral lungs bilaterally, compatible with interstitial lung disease as detailed on 12/07/2019 chest CT. No acute superimposed cardiopulmonary disease. Electronically Signed  By: Ilona Sorrel M.D.   On: 04/10/2020 13:58    Recent Labs: Lab Results  Component Value Date   WBC 10.2 10/23/2019   HGB 10.7 (L) 10/23/2019   PLT 334 10/23/2019   NA 138 10/23/2019   K 4.1 10/23/2019   CL 107 10/23/2019   CO2 21 (L) 10/23/2019   GLUCOSE 102 (H) 10/23/2019   BUN 23 10/23/2019   CREATININE 1.77 (H) 10/23/2019   BILITOT 1.7 (H) 03/18/2019   ALKPHOS 63 03/18/2019   AST 26 03/18/2019   ALT 20 03/18/2019   PROT 7.2 03/18/2019   ALBUMIN 4.1 03/18/2019   CALCIUM 9.0 10/23/2019   GFRAA 41 (L) 10/23/2019   IMPRESSION: 1. Left upper lobe 5 mm solid pulmonary nodule. Recommend attention on follow-up chest CT in 3-6 months given history of malignancy. 2. Spectrum of findings compatible with basilar predominant fibrotic interstitial lung disease without frank honeycombing, progressive at the lung bases compared to 05/08/2013 CT abdomen study. Findings are categorized as probable UIP per consensus guidelines: Diagnosis of Idiopathic Pulmonary Fibrosis: An Official ATS/ERS/JRS/ALAT Clinical Practice Guideline. [place], Iss 5, [phone], Oct 09 2016. 3. Three-vessel coronary atherosclerosis. 4. Aortic Atherosclerosis (ICD10-I70.0).   Electronically Signed   By: Ilona Sorrel M.D.   On: 12/07/2019 15:53 Speciality Comments: No specialty  comments available.  Procedures:  No procedures performed Allergies: Bee venom, Losartan, and Oxycodone   Assessment / Plan:     Visit Diagnoses: Positive ANA (antinuclear antibody) - 01/14/20: ANA 1:80NH, atypical P-ANCA 1:80, Ro-, La-, scl-70-, myomarker3 panel negative  -patient has low titer positive ANA.  He also gives history of sicca symptoms.  Ro and antibodies are negative.  There is no history of oral ulcers, nasal ulcers, malar rash, photosensitivity, Raynaud's phenomenon, inflammatory arthritis or lymphadenopathy.  I will obtain additional labs today.  Plan: Urinalysis, Routine w reflex microscopic, Sedimentation rate, RNP Antibody, Anti-Smith antibody, Anti-DNA antibody, double-stranded, C3 and C4  Abnormal ANCA (antineutrophil cytoplasmic antibody)-he has no clinical features of vasculitis.  ILD (interstitial lung disease) ([place]) -patient had high-resolution CT which showed UIP pattern fibrosis, followed by Dr. Vaughan Browner.  I reviewed records from March 07, 2020.- Plan: Rheumatoid factor although patient has no clinical features of synovitis or rheumatoid arthritis.  History of gout-he was diagnosed with gout about 5 years ago.  He has had no recurrence of gout on allopurinol and his symptoms are well controlled.  Decreased GFR-his GFR was in 40s last year.  I am uncertain about the etiology of low GFR.  Lumbar stenosis with neurogenic claudication - Discectomy by Dr. Arnoldo Morale.  He had good response to the surgery.  Other fatigue - Plan: CBC with Differential/Platelet, COMPLETE METABOLIC PANEL WITH GFR  Myalgia -he has been complaining of bilateral arm muscle pain which she relates to using a chainsaw recently.  All his joints were in good range of motion.  He had no muscular weakness on my examination.  Plan: CK  Paroxysmal atrial fibrillation (HCC)-followed by cardiology.  Coronary artery disease involving native coronary artery of native heart without angina pectoris  Long term  current use of anticoagulant therapy - Eliquis  Hypertensive heart disease without heart failure-his blood pressure is well controlled today.  Mixed hyperlipidemia  Prostate cancer ([place])  Malignant neoplasm of urinary bladder neck ([place])    Orders: Orders Placed This Encounter  Procedures  . CBC with Differential/Platelet  . COMPLETE METABOLIC PANEL WITH GFR  . Urinalysis, Routine w reflex  microscopic  . CK  . Sedimentation rate  . Rheumatoid factor  . RNP Antibody  . Anti-Smith antibody  . Anti-DNA antibody, double-stranded  . C3 and C4   No orders of the defined types were placed in this encounter.    Follow-Up Instructions: Return for ILD, +ANA.   Bo Merino, MD  Note - This record has been created using Editor, commissioning.  Chart creation errors have been sought, but may not always  have been located. Such creation errors do not reflect on  the standard of medical care.

## 2020-05-07 ENCOUNTER — Encounter: Payer: Self-pay | Admitting: Rheumatology

## 2020-05-07 ENCOUNTER — Other Ambulatory Visit: Payer: Self-pay

## 2020-05-07 ENCOUNTER — Ambulatory Visit: Payer: Medicare Other | Admitting: Rheumatology

## 2020-05-07 VITALS — BP 119/77 | HR 77 | Resp 15 | Ht 67.0 in | Wt 190.0 lb

## 2020-05-07 DIAGNOSIS — Z8739 Personal history of other diseases of the musculoskeletal system and connective tissue: Secondary | ICD-10-CM

## 2020-05-07 DIAGNOSIS — C675 Malignant neoplasm of bladder neck: Secondary | ICD-10-CM

## 2020-05-07 DIAGNOSIS — I119 Hypertensive heart disease without heart failure: Secondary | ICD-10-CM

## 2020-05-07 DIAGNOSIS — R944 Abnormal results of kidney function studies: Secondary | ICD-10-CM

## 2020-05-07 DIAGNOSIS — M791 Myalgia, unspecified site: Secondary | ICD-10-CM | POA: Diagnosis not present

## 2020-05-07 DIAGNOSIS — I2510 Atherosclerotic heart disease of native coronary artery without angina pectoris: Secondary | ICD-10-CM

## 2020-05-07 DIAGNOSIS — C61 Malignant neoplasm of prostate: Secondary | ICD-10-CM

## 2020-05-07 DIAGNOSIS — M48062 Spinal stenosis, lumbar region with neurogenic claudication: Secondary | ICD-10-CM | POA: Diagnosis not present

## 2020-05-07 DIAGNOSIS — Z7901 Long term (current) use of anticoagulants: Secondary | ICD-10-CM

## 2020-05-07 DIAGNOSIS — R5383 Other fatigue: Secondary | ICD-10-CM | POA: Diagnosis not present

## 2020-05-07 DIAGNOSIS — R768 Other specified abnormal immunological findings in serum: Secondary | ICD-10-CM

## 2020-05-07 DIAGNOSIS — J849 Interstitial pulmonary disease, unspecified: Secondary | ICD-10-CM

## 2020-05-07 DIAGNOSIS — I480 Paroxysmal atrial fibrillation: Secondary | ICD-10-CM

## 2020-05-07 DIAGNOSIS — E782 Mixed hyperlipidemia: Secondary | ICD-10-CM

## 2020-05-08 LAB — COMPLETE METABOLIC PANEL WITH GFR
AG Ratio: 1.8 (calc) (ref 1.0–2.5)
ALT: 14 U/L (ref 9–46)
AST: 20 U/L (ref 10–35)
Albumin: 4.1 g/dL (ref 3.6–5.1)
Alkaline phosphatase (APISO): 64 U/L (ref 35–144)
BUN/Creatinine Ratio: 19 (calc) (ref 6–22)
BUN: 26 mg/dL — ABNORMAL HIGH (ref 7–25)
CO2: 26 mmol/L (ref 20–32)
Calcium: 9.5 mg/dL (ref 8.6–10.3)
Chloride: 106 mmol/L (ref 98–110)
Creat: 1.35 mg/dL — ABNORMAL HIGH (ref 0.70–1.11)
GFR, Est African American: 56 mL/min/{1.73_m2} — ABNORMAL LOW (ref >=60)
GFR, Est Non African American: 48 mL/min/{1.73_m2} — ABNORMAL LOW (ref >=60)
Globulin: 2.3 g/dL (calc) (ref 1.9–3.7)
Glucose, Bld: 104 mg/dL — ABNORMAL HIGH (ref 65–99)
Potassium: 5.1 mmol/L (ref 3.5–5.3)
Sodium: 141 mmol/L (ref 135–146)
Total Bilirubin: 0.9 mg/dL (ref 0.2–1.2)
Total Protein: 6.4 g/dL (ref 6.1–8.1)

## 2020-05-08 LAB — URINALYSIS, ROUTINE W REFLEX MICROSCOPIC
Bilirubin Urine: NEGATIVE
Glucose, UA: NEGATIVE
Hgb urine dipstick: NEGATIVE
Ketones, ur: NEGATIVE
Leukocytes,Ua: NEGATIVE
Nitrite: NEGATIVE
Protein, ur: NEGATIVE
Specific Gravity, Urine: 1.016 (ref 1.001–1.03)
pH: 5.5 (ref 5.0–8.0)

## 2020-05-08 LAB — ANTI-DNA ANTIBODY, DOUBLE-STRANDED: ds DNA Ab: 3 IU/mL

## 2020-05-08 LAB — RNP ANTIBODY: Ribonucleic Protein(ENA) Antibody, IgG: <1 AI

## 2020-05-08 LAB — CBC WITH DIFFERENTIAL/PLATELET
Absolute Monocytes: 1082 cells/uL — ABNORMAL HIGH (ref 200–950)
Basophils Absolute: 63 cells/uL (ref 0–200)
Basophils Relative: 0.6 %
Eosinophils Absolute: 452 cells/uL (ref 15–500)
Eosinophils Relative: 4.3 %
HCT: 43.8 % (ref 38.5–50.0)
Hemoglobin: 13.2 g/dL (ref 13.2–17.1)
Lymphs Abs: 1313 cells/uL (ref 850–3900)
MCH: 23.2 pg — ABNORMAL LOW (ref 27.0–33.0)
MCHC: 30.1 g/dL — ABNORMAL LOW (ref 32.0–36.0)
MCV: 77 fL — ABNORMAL LOW (ref 80.0–100.0)
MPV: 10.7 fL (ref 7.5–12.5)
Monocytes Relative: 10.3 %
Neutro Abs: 7592 cells/uL (ref 1500–7800)
Neutrophils Relative %: 72.3 %
Platelets: 304 10*3/uL (ref 140–400)
RBC: 5.69 10*6/uL (ref 4.20–5.80)
RDW: 17 % — ABNORMAL HIGH (ref 11.0–15.0)
Total Lymphocyte: 12.5 %
WBC: 10.5 10*3/uL (ref 3.8–10.8)

## 2020-05-08 LAB — CK: Total CK: 36 U/L — ABNORMAL LOW (ref 44–196)

## 2020-05-08 LAB — C3 AND C4
C3 Complement: 122 mg/dL
C4 Complement: 20 mg/dL

## 2020-05-08 LAB — RHEUMATOID FACTOR: Rheumatoid fact SerPl-aCnc: <14 IU/mL (ref <14)

## 2020-05-08 LAB — SEDIMENTATION RATE: Sed Rate: 2 mm/h (ref 0–20)

## 2020-05-08 LAB — ANTI-SMITH ANTIBODY: ENA SM Ab Ser-aCnc: <1 AI

## 2020-05-09 ENCOUNTER — Encounter: Payer: Self-pay | Admitting: Gastroenterology

## 2020-05-09 ENCOUNTER — Ambulatory Visit (AMBULATORY_SURGERY_CENTER): Payer: Medicare Other | Admitting: Gastroenterology

## 2020-05-09 ENCOUNTER — Other Ambulatory Visit: Payer: Self-pay

## 2020-05-09 VITALS — BP 115/76 | HR 67 | Temp 98.0°F | Resp 23 | Ht 67.0 in | Wt 196.0 lb

## 2020-05-09 DIAGNOSIS — R053 Chronic cough: Secondary | ICD-10-CM

## 2020-05-09 DIAGNOSIS — K2100 Gastro-esophageal reflux disease with esophagitis, without bleeding: Secondary | ICD-10-CM

## 2020-05-09 DIAGNOSIS — K3189 Other diseases of stomach and duodenum: Secondary | ICD-10-CM

## 2020-05-09 DIAGNOSIS — K2970 Gastritis, unspecified, without bleeding: Secondary | ICD-10-CM

## 2020-05-09 DIAGNOSIS — K449 Diaphragmatic hernia without obstruction or gangrene: Secondary | ICD-10-CM

## 2020-05-09 MED ORDER — DEXLANSOPRAZOLE 60 MG PO CPDR: 60.0000 mg | DELAYED_RELEASE_CAPSULE | Freq: Every day | ORAL | 3 refills | Status: DC

## 2020-05-09 MED ORDER — FAMOTIDINE 20 MG PO TABS: 20.0000 mg | ORAL_TABLET | Freq: Every day | ORAL | 3 refills | Status: DC

## 2020-05-09 MED ORDER — SODIUM CHLORIDE 0.9 % IV SOLN: 500.0000 mL | Freq: Once | INTRAVENOUS | Status: DC

## 2020-05-09 NOTE — Progress Notes (Signed)
pt tolerated well. VSS. awake and to recovery. Report given to RN. Bite block left insitu to recovery. 

## 2020-05-09 NOTE — Patient Instructions (Signed)
YOU HAD AN ENDOSCOPIC PROCEDURE TODAY AT THE [facility]:   Refer to the procedure report that was given to you for any specific questions about what was found during the examination.  If the procedure report does not answer your questions, please call your gastroenterologist to clarify.  If you requested that your care partner not be given the details of your procedure findings, then the procedure report has been included in a sealed envelope for you to review at your convenience later.  YOU SHOULD EXPECT: Some feelings of bloating in the abdomen. Passage of more gas than usual.  Walking can help get rid of the air that was put into your GI tract during the procedure and reduce the bloating. If you had a lower endoscopy (such as a colonoscopy or flexible sigmoidoscopy) you may notice spotting of blood in your stool or on the toilet paper. If you underwent a bowel prep for your procedure, you may not have a normal bowel movement for a few days.  Please Note:  You might notice some irritation and congestion in your nose or some drainage.  This is from the oxygen used during your procedure.  There is no need for concern and it should clear up in a day or so.  SYMPTOMS TO REPORT IMMEDIATELY:   Following lower endoscopy (colonoscopy or flexible sigmoidoscopy):  Excessive amounts of blood in the stool  Significant tenderness or worsening of abdominal pains  Swelling of the abdomen that is new, acute  Fever of 100F or higher   Following upper endoscopy (EGD)  Vomiting of blood or coffee ground material  New chest pain or pain under the shoulder blades  Painful or persistently difficult swallowing  New shortness of breath  Fever of 100F or higher  Black, tarry-looking stools  For urgent or emergent issues, a gastroenterologist can be reached at any hour by calling [phone]. Do not use MyChart messaging for urgent concerns.    DIET:  We do recommend a small meal at first, but  then you may proceed to your regular diet.  Drink plenty of fluids but you should avoid alcoholic beverages for 24 hours.  ACTIVITY:  You should plan to take it easy for the rest of today and you should NOT DRIVE or use heavy machinery until tomorrow (because of the sedation medicines used during the test).    FOLLOW UP: Our staff will call the number listed on your records 48-72 hours following your procedure to check on you and address any questions or concerns that you may have regarding the information given to you following your procedure. If we do not reach you, we will leave a message.  We will attempt to reach you two times.  During this call, we will ask if you have developed any symptoms of COVID 19. If you develop any symptoms (ie: fever, flu-like symptoms, shortness of breath, cough etc.) before then, please call [phone].  If you test positive for Covid 19 in the 2 weeks post procedure, please call and report this information to us.    If any biopsies were taken you will be contacted by phone or by letter within the next 1-3 weeks.  Please call us at [phone] if you have not heard about the biopsies in 3 weeks.    SIGNATURES/CONFIDENTIALITY: You and/or your care partner have signed paperwork which will be entered into your electronic medical record.  These signatures attest to the fact that that the information above on   your After Visit Summary has been reviewed and is understood.  Full responsibility of the confidentiality of this discharge information lies with you and/or your care-partner. 

## 2020-05-09 NOTE — Progress Notes (Signed)
VS by [place]  Pt's states no medical or surgical changes since previsit or office visit.  

## 2020-05-09 NOTE — Progress Notes (Signed)
Called to room to assist during endoscopic procedure.  Patient ID and intended procedure confirmed with present staff. Received instructions for my participation in the procedure from the performing physician.  

## 2020-05-09 NOTE — Progress Notes (Signed)
Pt called to cancel mano/ph for Monday stating MD said he didn't need it.

## 2020-05-09 NOTE — Op Note (Signed)
[facility] Patient Name: Charles Wright Procedure Date: 05/09/2020 9:56 AM MRN: [id number] Endoscopist: Mauri Pole , MD Age: 85 Referring MD:  Date of Birth: July 11, 1935 Gender: Male Account #: [id number] Procedure:                Upper GI endoscopy Indications:              Esophageal reflux symptoms that persist despite                            appropriate therapy Medicines:                Monitored Anesthesia Care Procedure:                Pre-Anesthesia Assessment:                           - Prior to the procedure, a History and Physical                            was performed, and patient medications and                            allergies were reviewed. The patient's tolerance of                            previous anesthesia was also reviewed. The risks                            and benefits of the procedure and the sedation                            options and risks were discussed with the patient.                            All questions were answered, and informed consent                            was obtained. Prior Anticoagulants: The patient has                            taken no previous anticoagulant or antiplatelet                            agents. ASA Grade Assessment: III - A patient with                            severe systemic disease. After reviewing the risks                            and benefits, the patient was deemed in                            satisfactory condition to undergo the procedure.  After obtaining informed consent, the endoscope was                            passed under direct vision. Throughout the                            procedure, the patient's blood pressure, pulse, and                            oxygen saturations were monitored continuously. The                            Endoscope was introduced through the mouth, and                            advanced to the second part of  duodenum. The upper                            GI endoscopy was accomplished without difficulty.                            The patient tolerated the procedure well. Scope In: Scope Out: Findings:                 LA Grade B (one or more mucosal breaks greater than                            5 mm, not extending between the tops of two mucosal                            folds) esophagitis with no bleeding was found 38 to                            39 cm from the incisors.                           The gastroesophageal flap valve was visualized                            endoscopically and classified as Hill Grade III                            (minimal fold, loose to endoscope, hiatal hernia                            likely).                           No gross lesions were noted in the entire esophagus.                           A small 3cm hiatal hernia was present.  Patchy mild inflammation characterized by                            congestion (edema) and erythema was found in the                            entire examined stomach. Biopsies were taken with a                            cold forceps for Helicobacter pylori testing.                           Patchy mildly erythematous mucosa was found in the                            duodenal bulb. Complications:            No immediate complications. Estimated Blood Loss:     Estimated blood loss was minimal. Impression:               - LA Grade B reflux esophagitis with no bleeding.                           - Gastroesophageal flap valve classified as Hill                            Grade III (minimal fold, loose to endoscope, hiatal                            hernia likely).                           - No gross lesions in esophagus.                           - Small hiatal hernia.                           - Gastritis. Biopsied.                           - Erythematous duodenopathy. Recommendation:            - Patient has a contact number available for                            emergencies. The signs and symptoms of potential                            delayed complications were discussed with the                            patient. Return to normal activities tomorrow.                            Written discharge instructions were provided to the  patient.                           - Resume previous diet.                           - Continue present medications.                           - Await pathology results.                           - Follow an antireflux regimen.                           - Use Dexilant (dexlansoprazole) 60 mg PO daily for                            3 months.                           - Use Pepcid (famotidine) 20 mg PO daily PRN at                            bedtime.                           - Return to GI office at the next available                            appointment. Please call to schedule appointment Mauri Pole, MD 05/09/2020 10:21:18 AM This report has been signed electronically.

## 2020-05-12 ENCOUNTER — Encounter (HOSPITAL_COMMUNITY): Admission: RE | Payer: Self-pay | Source: Ambulatory Visit

## 2020-05-12 ENCOUNTER — Ambulatory Visit (HOSPITAL_COMMUNITY): Admission: RE | Admit: 2020-05-12 | Payer: Medicare Other | Source: Ambulatory Visit | Admitting: Gastroenterology

## 2020-05-12 ENCOUNTER — Other Ambulatory Visit: Payer: Self-pay

## 2020-05-12 ENCOUNTER — Ambulatory Visit
Admission: RE | Admit: 2020-05-12 | Discharge: 2020-05-12 | Disposition: A | Discharge status: Home / Self Care | Payer: Medicare Other | Source: Ambulatory Visit | Attending: Pulmonary Disease | Admitting: Pulmonary Disease

## 2020-05-12 DIAGNOSIS — R911 Solitary pulmonary nodule: Secondary | ICD-10-CM

## 2020-05-12 DIAGNOSIS — R918 Other nonspecific abnormal finding of lung field: Secondary | ICD-10-CM | POA: Diagnosis not present

## 2020-05-12 DIAGNOSIS — K2950 Unspecified chronic gastritis without bleeding: Secondary | ICD-10-CM | POA: Diagnosis not present

## 2020-05-12 SURGERY — MONITORING, ESOPHAGEAL PH, 24 HOUR

## 2020-05-13 ENCOUNTER — Telehealth: Payer: Self-pay

## 2020-05-13 ENCOUNTER — Telehealth: Payer: Self-pay | Admitting: *Deleted

## 2020-05-13 DIAGNOSIS — E7800 Pure hypercholesterolemia, unspecified: Secondary | ICD-10-CM | POA: Diagnosis not present

## 2020-05-13 DIAGNOSIS — Z0000 Encounter for general adult medical examination without abnormal findings: Secondary | ICD-10-CM | POA: Diagnosis not present

## 2020-05-13 DIAGNOSIS — E039 Hypothyroidism, unspecified: Secondary | ICD-10-CM | POA: Diagnosis not present

## 2020-05-13 DIAGNOSIS — I10 Essential (primary) hypertension: Secondary | ICD-10-CM | POA: Diagnosis not present

## 2020-05-13 NOTE — Telephone Encounter (Signed)
  Follow up Call-  Call back number 05/09/2020  Post procedure Call Back phone  # [phone]  Permission to leave phone message Yes  Some recent data might be hidden     Patient questions:  Do you have a fever, pain , or abdominal swelling? No. Pain Score  0 *  Have you tolerated food without any problems? Yes.    Have you been able to return to your normal activities? Yes.    Do you have any questions about your discharge instructions: Diet   No. Medications  No. Follow up visit  No.  Do you have questions or concerns about your Care? No.  Actions: * If pain score is 4 or above: No action needed, pain <4.

## 2020-05-13 NOTE — Telephone Encounter (Signed)
First attempt, left VM.  

## 2020-05-31 NOTE — Progress Notes (Signed)
Reviewed and agree with documentation and assessment and plan. K. Veena Gevon Markus , MD   

## 2020-06-03 ENCOUNTER — Encounter: Payer: Self-pay | Admitting: Gastroenterology

## 2020-06-07 NOTE — Progress Notes (Signed)
Office Visit Note  Patient: Charles Wright             Date of Birth: 1935-02-15           MRN: [id number]             PCP: Maury Dus, MD Referring: Maury Dus, MD Visit Date: 06/11/2020 Occupation: _0 @  Subjective:  Shortness of breath.   History of Present Illness: Charles Wright is a 85 y.o. male was recently diagnosed with UIP by Dr. Vaughan Browner.  He continues to have shortness of breath on exertion.  He states his shortness of breath is somewhat better.  He also has reflux symptoms.  The discomfort in his arms is improved which she related to chainsaw use.  He has not had any gout flare.  He continues to have some lower back discomfort off and on.  He denies any history of joint swelling.  There is no history of oral ulcers, nasal ulcers, malar rash, photosensitivity,  raynaud's phenominon or sicca symptoms.  Activities of Daily Living:  Patient reports morning stiffness for 0 minutes.   Patient Denies nocturnal pain.  Difficulty dressing/grooming: Denies Difficulty climbing stairs: Denies Difficulty getting out of chair: Denies Difficulty using hands for taps, buttons, cutlery, and/or writing: Denies  Review of Systems  Constitutional: Negative for fatigue and night sweats.  HENT: Negative for mouth sores, mouth dryness and nose dryness.   Eyes: Positive for dryness. Negative for pain, redness and itching.  Respiratory: Positive for shortness of breath. Negative for difficulty breathing.   Cardiovascular: Negative for chest pain, palpitations, hypertension, irregular heartbeat and swelling in legs/feet.  Gastrointestinal: Negative for blood in stool, constipation and diarrhea.  Endocrine: Negative for increased urination.  Genitourinary: Negative for difficulty urinating.  Musculoskeletal: Positive for myalgias and myalgias. Negative for arthralgias, joint pain, joint swelling, muscle weakness, morning stiffness and muscle tenderness.  Skin: Negative for color change,  rash, hair loss, nodules/bumps, redness, skin tightness, ulcers and sensitivity to sunlight.  Allergic/Immunologic: Negative for susceptible to infections.  Neurological: Negative for dizziness, fainting, numbness, headaches, memory loss, night sweats and weakness.  Hematological: Positive for bruising/bleeding tendency. Negative for swollen glands.  Psychiatric/Behavioral: Negative for depressed mood, confusion and sleep disturbance. The patient is not nervous/anxious.     PMFS History:  Patient Active Problem List   Diagnosis Date Noted  . Renal mass 04/13/2019  . Gross hematuria 04/08/2019  . Hyperlipidemia 12/14/2014  . CAD (coronary artery disease), native coronary artery   . Paroxysmal atrial fibrillation ([place]) 08/13/2014  . Long term current use of anticoagulant therapy 08/13/2014  . Obesity (BMI 30-39.9) 08/13/2014  . Hypertensive heart disease   . Sleep apnea   . Bladder cancer ([place])   . Prostate cancer ([place])   . Lumbar stenosis with neurogenic claudication 12/03/2013  . Upper airway cough syndrome 01/27/2013    Past Medical History:  Diagnosis Date  . Acute gout of left ankle    06-14-2015  . Bladder cancer (HCC)    BCG's tx's  . BPH (benign prostatic hypertrophy)   . CAD (coronary artery disease) CARDIOLOGIST-  DR TILLEY   a. NSTEMI 12/2014 -  99% dLAD-2 (not amenable to PCI), 50% dLAD-1, 60% mLAD. Medical therapy was recommended.  . Cancer of kidney ([place])    left  . Cough    HARSH NONPRODUCTIVE COUGH 1 AND 1/2 WEEKS  . GERD (gastroesophageal reflux disease)    takes  OTC periodically  . Gilbert's syndrome   .  H/O cardiac catheterization    a. Note: Difficult radial access in 12/2014 - recommend femoral approach if cath needed in the future.  Marland Kitchen History of kidney stones   . History of non-ST elevation myocardial infarction (NSTEMI)    12-12-2014   CARDIAC CATH W/ NO INTERVENTION X 2FEW DAYS APART  . History of spinal fracture 2002   LUMBAR  . Hyperlipidemia    . Hypertension   . Hypothyroidism   . Myocardial infarction ([place])   . OSA on CPAP    SET ON 7 USES SOME NIGHTS  . Paroxysmal A-fib [facility])    cardiologist-  dr Wynonia Lawman  . Prostate cancer ([place]) 2019   last PSA  2.9  (montiored by urologist  dr Junious Silk) [place] FEB 2019 RAD DONE  . Shingles    2 weeks ago  . Wears glasses     Family History  Problem Relation Age of Onset  . Leukemia Mother   . Heart attack Father   . Cancer Sister        breast  . Hypertension Neg Hx   . Stroke Neg Hx   . Colon cancer Neg Hx   . Esophageal cancer Neg Hx   . Rectal cancer Neg Hx   . Stomach cancer Neg Hx    Past Surgical History:  Procedure Laterality Date  . BACK SURGERY  2014   LOWER l 1, 2 4 AND 5  . BILATERAL INGUINAL HERNIA REPAIR    . CARDIAC CATHETERIZATION N/A 12/13/2014   Procedure: Left Heart Cath and Coronary Angiography;  Surgeon: Leonie Man, MD;  Location: [facility];  Service: Cardiovascular;  Laterality: N/A;  culprit lesion diat LAD-2  99%, not approachable via PCTA  due to extremely tortuous up stream LAD/  mLAD 60% and dLAD-1 50%, both are at the extremely tortuous segment and not PCI targets/  otherwise normal coronary arteries  . COLONOSCOPY WITH PROPOFOL N/A 01/16/2019   Procedure: COLONOSCOPY WITH PROPOFOL;  Surgeon: Clarene Essex, MD;  Location: [facility];  Service: Endoscopy;  Laterality: N/A;  . CYSTOSCOPY W/ RETROGRADES Left 08/22/2012   Procedure: CYSTOSCOPY WITH RETROGRADE PYELOGRAM;  left kidney washings;  Surgeon: Fredricka Bonine, MD;  Location: [facility];  Service: Urology;  Laterality: Left;  . CYSTOSCOPY W/ RETROGRADES N/A 07/08/2015   Procedure: CYSTOSCOPY WITH RETROGRADE PYELOGRAM;  Surgeon: [name], MD;  Location[facility];  Service: Urology;  Laterality: N/A;  . CYSTOSCOPY W/ URETERAL STENT PLACEMENT Left 03/24/2019   Procedure: CYSTOSCOPY WITH RETROGRADE PYELOGRAM/URETERAL STENT PLACEMENT  ureteroscopy biopsy of neoplasm;  Surgeon: [name], MD;  [facility]: [facility];  Service: Urology;  Laterality: Left;  . CYSTOSCOPY W/ URETERAL STENT PLACEMENT Left 04/08/2019   Procedure: CYSTOSCOPY cystogram clot evacuation left ureteroscopy clot evacuation left stent exchange liimited transuretheral resectio of prastate and fulguration  bleeders of prostate;  Surgeon: [name], MD;  Location: [facility];  Service: Urology;  Laterality: Left;  . CYSTOSCOPY WITH BIOPSY  08/22/2012   Procedure: CYSTOSCOPY WITH BIOPSY;  Surgeon: Fredricka Bonine, MD;  Location[facility];  Service: Urology;;  . [name] WITH BIOPSY N/A 11/08/2014   Procedure: CYSTOSCOPY/BIOPSPY BLADDER INSTILLATION OF MARCAINE AND PYRIDIUM;  Surgeon: [name], MD;  Location: [facility];  Service: Urology;  Laterality: N/A;  . CYSTOSCOPY WITH BIOPSY N/A 07/08/2015   Procedure: CYSTOSCOPY WITH BLADDER BIOPSY, FULGURATION, RETROGRADE PYLOGRAM AND RENAL WASHING;  Surgeon: [name], MD;  Location: [facility];  Service: Urology;  Laterality: N/A;  . CYSTOSCOPY WITH RETROGRADE PYELOGRAM, URETEROSCOPY AND STENT PLACEMENT Bilateral 07/30/2014   Procedure: CYSTOSCOPY WITH BLADDER BX FULERGATION AND BILATERAL RETROGRADE PYELOGRAM,;  Surgeon: [name], MD;  [facility];  Service: Urology;  Laterality: Bilateral;  . CYSTOSCOPY WITH STENT PLACEMENT Left 03/24/2018   Procedure: STENT PLACEMENT AND BIOPSY;  Surgeon: [name], MD;  Location: [facility];  Service: Urology;  Laterality: Left;  . CYSTOSCOPY/RETROGRADE/URETEROSCOPY Bilateral 08/17/2013   Procedure: CYSTOSCOPY, BLADDER BIOPSY WITH BILATERAL RETROGRADE PYELOGRAM, LEFT URETEROSCOPY AND DILATION OF STRICTURE ;  Surgeon: [name], MD;  [facility];  Service: Urology;  Laterality: Bilateral;  . CYSTOSCOPY/RETROGRADE/URETEROSCOPY Left 03/24/2018    Procedure: CYSTOSCOPY/RETROGRADE/URETEROSCOPY;  Surgeon: [name], MD;  Location: [facility];  Service: Urology;  Laterality: Left;  . HOT HEMOSTASIS N/A 01/16/2019   Procedure: HOT HEMOSTASIS (ARGON PLASMA COAGULATION/BICAP);  Surgeon: Clarene Essex, MD;  Location: [facility];  Service: Endoscopy;  Laterality: N/A;  . LEFT ATRIAL APPENDAGE OCCLUSION N/A 01/09/2015   Procedure: LEFT ATRIAL APPENDAGE OCCLUSION;  Surgeon: [name], MD;  Location: [facility];  Service: Cardiovascular;  Laterality: N/A;  . LUMBAR LAMINECTOMY/DECOMPRESSION MICRODISCECTOMY Left 12/03/2013   Procedure: Left Lumbar three-four diskectomy with Lumbar four laminectomy ;  Surgeon: Newman Pies, MD;  Location: [facility];  Service: Neurosurgery;  Laterality: Left;  Left Lumbar three-four diskectomy with Lumbar four laminectomy   . LYMPH NODE DISSECTION Bilateral 04/13/2019   Procedure: LYMPH NODE DISSECTION;  Surgeon: Alexis Frock, MD;  [facility];  Service: Urology;  Laterality: Bilateral;  . ORIF LEFT ANKLE FX  2005  . POLYPECTOMY  01/16/2019   Procedure: POLYPECTOMY;  Surgeon: Clarene Essex, MD;  [facility];  Service: Endoscopy;;  . PROSTATE BIOPSY N/A 08/22/2012   Procedure: BIOPSY TRANSRECTAL ULTRASONIC PROSTATE (TUBP);  Surgeon: Fredricka Bonine, MD;  Location: [facility];  Service: Urology;  Laterality: N/A;  . ROBOT ASSITED LAPAROSCOPIC NEPHROURETERECTOMY Left 04/13/2019   Procedure: XI ROBOT ASSITED LAPAROSCOPIC NEPHROURETERECTOMY;  Surgeon: Alexis Frock, MD;  [facility];  Service: Urology;  Laterality: Left;  3 HRS  . TEE WITHOUT CARDIOVERSION N/A 12/31/2014   Procedure: TRANSESOPHAGEAL ECHOCARDIOGRAM (TEE);  Surgeon: Pixie Casino, MD;  Location: [facility];  Service: Cardiovascular;  Laterality: N/A;  ef 55-60%/  mild MR, TR, and PR/  mild LAE and RAE  . TRANSURETHRAL RESECTION OF BLADDER TUMOR  10/22/2011   Procedure:  TRANSURETHRAL RESECTION OF BLADDER TUMOR (TURBT);  Surgeon: Fredricka Bonine, MD;  Location: [facility];  Service: Urology;  Laterality: N/A;  TURBT, LEFT URETEROSCOPY, POSSIBLE URETERAL STENT   . URETEROSCOPY  10/22/2011   Procedure: URETEROSCOPY;  Surgeon: Fredricka Bonine, MD;  Location: [facility];  Service: Urology;  Laterality: Left;  . WISDOM TOOTH EXTRACTION     Social History   Social History Narrative  . Not on file   Immunization History  Administered Date(s) Administered  . Fluad Quad(high Dose 65+) 12/26/2019  . Hepatitis A, Adult 07/31/1998, 03/09/1999  . Hepatitis B, ped/adol 07/31/1998, 09/08/1998, 03/09/1999  . IPV 04/03/2004  . Influenza Split 12/15/2007, 10/30/2009, 12/09/2012, 11/08/2016, 12/15/2017  . Influenza, High Dose Seasonal PF 11/08/2013, 11/10/2015, 12/15/2017, 11/15/2018, 11/08/2019  . Influenza,inj,Quad PF,6+ Mos 10/28/2014  . Meningococcal Conjugate 04/03/2004  . PFIZER(Purple Top)SARS-COV-2 Vaccination 04/05/2019, 04/25/2019, 04/07/2020  . Pneumococcal Conjugate-13 03/21/2014  . Pneumococcal Polysaccharide-23 03/12/2003  . Pneumococcal-Unspecified 03/12/2003  .  Td 12/15/2007  . Tdap 03/21/2014  . Yellow Fever 07/31/1998  . Zoster 02/09/2007     Objective: Vital Signs: BP 121/74 (BP Location: Left Arm, Patient Position: Sitting, Cuff Size: Normal)   Pulse 64   Resp 15   Ht $R'5\' 7"'RC$  (1.702 m)   Wt 197 lb 12.8 oz (89.7 kg)   BMI 30.98 kg/m    Physical Exam Vitals and nursing note reviewed.  Constitutional:      Appearance: He is well-developed.  HENT:     Head: Normocephalic and atraumatic.  Eyes:     Conjunctiva/sclera: Conjunctivae normal.     Pupils: Pupils are equal, round, and reactive to light.  Cardiovascular:     Rate and Rhythm: Normal rate and regular rhythm.     Heart sounds: Normal heart sounds.  Pulmonary:     Effort: Pulmonary effort is normal.     Breath sounds: Rales present.   Abdominal:     General: Bowel sounds are normal.     Palpations: Abdomen is soft.  Musculoskeletal:     Cervical back: Normal range of motion and neck supple.  Skin:    General: Skin is warm and dry.     Capillary Refill: Capillary refill takes less than 2 seconds.  Neurological:     Mental Status: He is alert and oriented to person, place, and time.  Psychiatric:        Behavior: Behavior normal.      Musculoskeletal Exam: C-spine was in good range of motion.  Shoulder joints, elbow joints, wrist joints, MCPs PIPs and DIPs with good range of motion with no synovitis.  Hip joints, knee joints, ankles were in good range of motion.  There was no tenderness over ankles or MTPs.  No synovitis was noted on the examination.  CDAI Exam: CDAI Score: -- Patient Global: --; Provider Global: -- Swollen: --; Tender: -- Joint Exam 06/11/2020   No joint exam has been documented for this visit   There is currently no information documented on the homunculus. Go to the Rheumatology activity and complete the homunculus joint exam.  Investigation: No additional findings.  Imaging: No results found.  Recent Labs: Lab Results  Component Value Date   WBC 10.5 05/07/2020   HGB 13.2 05/07/2020   PLT 304 05/07/2020   NA 141 05/07/2020   K 5.1 05/07/2020   CL 106 05/07/2020   CO2 26 05/07/2020   GLUCOSE 104 (H) 05/07/2020   BUN 26 (H) 05/07/2020   CREATININE 1.35 (H) 05/07/2020   BILITOT 0.9 05/07/2020   ALKPHOS 63 03/18/2019   AST 20 05/07/2020   ALT 14 05/07/2020   PROT 6.4 05/07/2020   ALBUMIN 4.1 03/18/2019   CALCIUM 9.5 05/07/2020   GFRAA 56 (L) 05/07/2020   May 07, 2020 UA negative,CK 36, ESR 2, dsDNA negative, Smith negative, RNP negative, C3-C4 normal, RF negative  01/14/20: ANA 1:80NH, atypical P-ANCA 1:80, Ro-, La-, scl-70-, myomarker3 panel negative   Speciality Comments: No specialty comments available.  Procedures:  No procedures performed Allergies: Bee venom,  Losartan, and Oxycodone   Assessment / Plan:     Visit Diagnoses: Positive ANA (antinuclear antibody) - Positive ANA low titer, ENA negative, C3-C4 normal.  All the lab findings were discussed with the patient at length.  No history of oral ulcers, nasal ulcers, malar rash, photosensitivity, Raynaud's, lymphadenopathy, or inflammatory arthritis.  Abnormal ANCA (antineutrophil cytoplasmic antibody)-ANCA was low titer and he had no clinical features of vasculitis.  ILD (interstitial  lung disease) ([place]) - Diagnosed with UIP and pulmonary fibrosis  by Dr. Vaughan Browner.  Patient gives history of chronic ammonia exposure while he was working.  He also states that his symptoms got worse after the COVID-19 infection.   Pulmonary nodule - Noted on the CT chest.  Patient is followed by oncologist.  History of gout - Diagnosed 5 years ago.  He is asymptomatic on allopurinol.  Decreased GFR - In the 40s.  Lumbar stenosis with neurogenic claudication - History of discectomy by Dr. Arnoldo Morale.  Malignant neoplasm of urinary bladder neck (HCC)  Prostate cancer ([place])  Hypertensive heart disease without heart failure  Mixed hyperlipidemia  Coronary artery disease involving native coronary artery of native heart without angina pectoris  Paroxysmal atrial fibrillation ([place])  Long term current use of anticoagulant therapy  Orders: No orders of the defined types were placed in this encounter.  No orders of the defined types were placed in this encounter.    Follow-Up Instructions: Return if symptoms worsen or fail to improve, for UIP.   Bo Merino, MD  Note - This record has been created using Editor, commissioning.  Chart creation errors have been sought, but may not always  have been located. Such creation errors do not reflect on  the standard of medical care.

## 2020-06-11 ENCOUNTER — Other Ambulatory Visit: Payer: Self-pay

## 2020-06-11 ENCOUNTER — Encounter: Payer: Self-pay | Admitting: Rheumatology

## 2020-06-11 ENCOUNTER — Ambulatory Visit: Payer: Medicare Other | Admitting: Rheumatology

## 2020-06-11 VITALS — BP 121/74 | HR 64 | Resp 15 | Ht 67.0 in | Wt 197.8 lb

## 2020-06-11 DIAGNOSIS — J849 Interstitial pulmonary disease, unspecified: Secondary | ICD-10-CM

## 2020-06-11 DIAGNOSIS — R768 Other specified abnormal immunological findings in serum: Secondary | ICD-10-CM

## 2020-06-11 DIAGNOSIS — C675 Malignant neoplasm of bladder neck: Secondary | ICD-10-CM

## 2020-06-11 DIAGNOSIS — Z7901 Long term (current) use of anticoagulants: Secondary | ICD-10-CM

## 2020-06-11 DIAGNOSIS — I480 Paroxysmal atrial fibrillation: Secondary | ICD-10-CM

## 2020-06-11 DIAGNOSIS — M48062 Spinal stenosis, lumbar region with neurogenic claudication: Secondary | ICD-10-CM

## 2020-06-11 DIAGNOSIS — I119 Hypertensive heart disease without heart failure: Secondary | ICD-10-CM

## 2020-06-11 DIAGNOSIS — R911 Solitary pulmonary nodule: Secondary | ICD-10-CM | POA: Diagnosis not present

## 2020-06-11 DIAGNOSIS — R7689 Other specified abnormal immunological findings in serum: Secondary | ICD-10-CM

## 2020-06-11 DIAGNOSIS — C61 Malignant neoplasm of prostate: Secondary | ICD-10-CM

## 2020-06-11 DIAGNOSIS — Z8739 Personal history of other diseases of the musculoskeletal system and connective tissue: Secondary | ICD-10-CM

## 2020-06-11 DIAGNOSIS — R944 Abnormal results of kidney function studies: Secondary | ICD-10-CM

## 2020-06-11 DIAGNOSIS — E782 Mixed hyperlipidemia: Secondary | ICD-10-CM

## 2020-06-11 DIAGNOSIS — I2510 Atherosclerotic heart disease of native coronary artery without angina pectoris: Secondary | ICD-10-CM

## 2020-06-17 ENCOUNTER — Telehealth: Payer: Self-pay | Admitting: *Deleted

## 2020-06-17 NOTE — Telephone Encounter (Signed)
Called patient to be sure he wanted the [place] because I did not see it on his med list.  He said insurance would not pay for it but he will try it. Done prior auth through Cover My Meds today

## 2020-06-23 DIAGNOSIS — H52223 Regular astigmatism, bilateral: Secondary | ICD-10-CM | POA: Diagnosis not present

## 2020-06-30 ENCOUNTER — Telehealth: Payer: Self-pay | Admitting: *Deleted

## 2020-06-30 ENCOUNTER — Ambulatory Visit: Payer: Self-pay | Admitting: General Surgery

## 2020-06-30 DIAGNOSIS — K432 Incisional hernia without obstruction or gangrene: Secondary | ICD-10-CM | POA: Diagnosis not present

## 2020-06-30 NOTE — Telephone Encounter (Signed)
Will route to PharmD for rec's re: holding anticoagulation. Richardson Dopp, PA-C    06/30/2020 5:42 PM

## 2020-06-30 NOTE — H&P (Signed)
History of Present Illness The patient is a 85 year old male who presents with an incisional hernia. Referred by: Dr. Junious Silk Chief Complaint: Incisional hernia  Patient is a 85 year old male with a history of urologic stage IV cancer. Patient underwent robotic nephroureterectomy.  Physical patient states approximate 5 months after surgery he had a hernia. Patient states that the hernia got larger. Patient has had a history of Covid cough. He has a chronic cough is currently being seen by pulmonary.  Patient states that he does have some discomfort and pain. He is active does do some lifting. He's had no signs or symptoms of incarceration or strangulation.    Past Surgical History Cataract Surgery  Bilateral. Colon Polyp Removal - Colonoscopy  Foot Surgery  Left. Laparoscopic Inguinal Hernia Surgery  Right. Nephrectomy  Left. Open Inguinal Hernia Surgery  Left. Spinal Surgery - Lower Back  Vasectomy   Diagnostic Studies History  Colonoscopy  within last year  Allergies  Allergies Reconciled  No Known Drug Allergies  [06/30/2020]:  Medication History  Allopurinol (300MG  Tablet, Oral) Active. Eliquis (2.5MG  Tablet, Oral) Active. Famotidine (20MG  Tablet, Oral) Active. Levothyroxine Sodium (50MCG Tablet, Oral) Active. Metoprolol Tartrate (25MG  Tablet, Oral) Active. Nitroglycerin (0.4MG  Tab Sublingual, Sublingual) Active. Pravastatin Sodium (40MG  Tablet, Oral) Active. Triamcinolone Acetonide (0.1% Cream, External) Active.  Social History Alcohol use  Recently quit alcohol use. Caffeine use  Coffee, Tea. Tobacco use  Never smoker.  Family History Breast Cancer  Sister. Cancer  Mother. Heart Disease  Fa Bladder Problems  Cancer  Chronic Renal Failure Syndrome  Inguinal Hernia  Kidney Stone  Myocardial infarction  Prostate Cancer     Review of System General Present- Fatigue. Not Present- Appetite Loss, Chills, Fever, Night  Sweats, Weight Gain and Weight Loss. Skin Not Present- Change in Wart/Mole, Dryness, Hives, Jaundice, New Lesions, Non-Healing Wounds, Rash and Ulcer. HEENT Present- Hearing Loss and Wears glasses/contact lenses. Not Present- Earache, Hoarseness, Nose Bleed, Oral Ulcers, Ringing in the Ears, Seasonal Allergies, Sinus Pain, Sore Throat, Visual Disturbances and Yellow Eyes. Breast Not Present- Breast Mass, Breast Pain, Nipple Discharge and Skin Changes. Cardiovascular Present- Rapid Heart Rate. Not Present- Chest Pain, Difficulty Breathing Lying Down, Leg Cramps, Palpitations, Shortness of Breath and Swelling of Extremities. Gastrointestinal Present- Hemorrhoids. Not Present- Abdominal Pain, Bloating, Bloody Stool, Change in Bowel Habits, Chronic diarrhea, Constipation, Difficulty Swallowing, Excessive gas, Gets full quickly at meals, Indigestion, Nausea, Rectal Pain and Vomiting. Male Genitourinary Not Present- Blood in Urine, Change in Urinary Stream, Frequency, Impotence, Nocturia, Painful Urination, Urgency and Urine Leakage. Musculoskeletal Present- Joint Stiffness. Not Present- Back Pain, Joint Pain, Muscle Pain, Muscle Weakness and Swelling of Extremities. Neurological Not Present- Decreased Memory, Fainting, Headaches, Numbness, Seizures, Tingling, Tremor, Trouble walking and Weakness. Psychiatric Not Present- Anxiety, Bipolar, Change in Sleep Pattern, Depression, Fearful and Frequent crying. Endocrine Not Present- Cold Intolerance, Excessive Hunger, Hair Changes, Heat Intolerance, Hot flashes and New Diabetes. Hematology Not Present- Blood Thinners, Easy Bruising, Excessive bleeding, Gland problems, HIV and Persistent Infections. All other systems negative  Vitals Weight: 190.25 lb Temp.: 97.76F  Pulse: 45 (Regular)  Resp.: 20 (Unlabored)  P.OX: 99% (Room air)       Physical Exam PE:  Constitutional: No acute distress, conversant, appears states age. Eyes: Anicteric  sclerae, moist conjunctiva, no lid lag Lungs: Clear to auscultation bilaterally, normal respiratory effort CV: regular rate and rhythm, no murmurs, no peripheral edema, pedal pulses 2+ GI: Soft, no masses or hepatosplenomegaly, non-tender to palpation Skin: No rashes,  palpation reveals normal turgor Psychiatric: appropriate judgment and insight, oriented to person, place, and time   Abdomen Inspection Hernias - Incisional - Reducible (Midline incisional hernia.) .    Assessment & Plan INCISIONAL HERNIA, WITHOUT OBSTRUCTION OR GANGRENE (K43.2) Impression: Patient is an 85 year old male, with an incisional hernia status post nephroureterectomy. Visualizing the CT scan which I personally. He has a small Incisional hernia. This appears to be to 3 cm in width.  Patient has a history of A. fib-on eliquis we'll obtain clearance to be off this prior surgery.  1. The patient will like to proceed to the operating room for robotic incisional hernia repair with mesh.  2. I discussed with the patient the signs and symptoms of incarceration and strangulation and the need to proceed to the ER should they occur.  3. I discussed with the patient the risks and benefits of the procedure to include but not limited to: Infection, bleeding, damage to surrounding structures, possible need for further surgery, possible nerve pain, and possible recurrence. The patient was understanding and wishes to proceed.

## 2020-06-30 NOTE — Telephone Encounter (Signed)
   [facility] Pre-operative Risk Assessment    Patient Name: Charles Wright  DOB: 1935/07/12  MRN: [id number]   HEARTCARE STAFF: - Please ensure there is not already an duplicate clearance open for this procedure. - Under Visit Info/Reason for Call, type in Other and utilize the format Clearance MM/DD/YY or Clearance TBD. Do not use dashes or single digits. - If request is for dental extraction, please clarify the # of teeth to be extracted.  Request for surgical clearance:  1. What type of surgery is being performed? Robotic incisional hernia repair with mesh   2. When is this surgery scheduled? TBD   3. What type of clearance is required (medical clearance vs. Pharmacy clearance to hold med vs. Both)? both  4. Are there any medications that need to be held prior to surgery and how long?eliquis-need direction   5. Practice name and name of physician performing surgery? [facility]   6. What is the office phone number? [phone]   7.   What is the office fax number? [phone]  8.   Anesthesia type (None, local, MAC, general) ? general   Fredia Beets 06/30/2020, 4:45 PM  _________________________________________________________________   (provider comments below)

## 2020-07-01 NOTE — Telephone Encounter (Signed)
Please see notes from PharmD.  Please contact surgeon's office and ask if surgeon is ok holding Eliquis for 1 day prior to procedure.    Richardson Dopp, PA-C    07/01/2020 10:56 AM

## 2020-07-01 NOTE — Telephone Encounter (Signed)
Spoke with Abigail Butts, RN @ [facility].  She will get with Dr. Rosendo Gros and see if 1 day hold of Eliquis is acceptable and call us back.

## 2020-07-01 NOTE — Telephone Encounter (Signed)
Patient with diagnosis of afib on Eliquis for anticoagulation.    Procedure: Robotic incisional hernia repair with mesh   Date of procedure: TBD  CHA2DS2-VASc Score = 6  This indicates a 9.7% annual risk of stroke. The patient's score is based upon: CHF History: No HTN History: Yes Diabetes History: No Stroke History: Yes Vascular Disease History: Yes Age Score: 2 Gender Score: 0  CrCl 70mL/min Platelet count 304K  Prefer to minimize time off anticoagulation given hx of afib and prior stroke. Would see if 1 day Eliquis hold is acceptable. If > 24 hour hold is required, will need MD input.

## 2020-07-02 DIAGNOSIS — C61 Malignant neoplasm of prostate: Secondary | ICD-10-CM | POA: Diagnosis not present

## 2020-07-02 DIAGNOSIS — E349 Endocrine disorder, unspecified: Secondary | ICD-10-CM | POA: Diagnosis not present

## 2020-07-03 NOTE — Telephone Encounter (Signed)
Higher risk of stroke with CHADSVASC score of 6 - agree to minimize time off Eliquis - would advise 24 hours off prior to procedure, if 2 days is needed, consider a single lovenox dose on the day prior to surgery. Ok to proceed with surgery. Restart anticoagulation as soon as practical after.  Dr. Debara Pickett

## 2020-07-03 NOTE — Telephone Encounter (Signed)
   Patient Name: Charles Wright  DOB: [birth year]  MRN: [id number]   Primary Cardiologist: Pixie Casino, MD  Chart reviewed as part of pre-operative protocol coverage.   Patient with diagnosis of afib on Eliquis for anticoagulation.    Procedure: Robotic incisional hernia repair with mesh  Date of procedure: TBD  CHA2DS2-VASc Score = 6  This indicates a 9.7% annual risk of stroke. The patient's score is based upon: CHF History: No HTN History: Yes Diabetes History: No Stroke History: Yes Vascular Disease History: Yes Age Score: 2 Gender Score: 0  CrCl 41mL/min Platelet count 304K  Prefer to minimize time off anticoagulation given hx of afib and prior stroke. Would see if 1 day Eliquis hold is acceptable. If > 24 hour hold is required, will need MD input.    Dr. Debara Pickett, can you please provide recommendations regarding the holding of the patient's Eliquis per pharmacist recommendations. Our pharmacy team has recommended minimization of holding Eliquis given the patient's history of stroke and Afib, preferably to 1 day. The treating surgeon would like 2 days off Eliquis.  Please route back to the pre-op pool (p cv div preop)   Christell Faith, PA-C 07/03/2020, 11:43 AM

## 2020-07-03 NOTE — Telephone Encounter (Signed)
Per staff message from [place], [place] @ [facility],  Good Morning, Anderson Malta   Dr Rosendo Gros would like to hold Eliquis 2 days so if you could consult cardiologist. We also need medical cardiac clearance as well as medication.   Thank you,   Abigail Butts   Will route back to preop pool

## 2020-07-04 NOTE — Telephone Encounter (Signed)
   Patient Name: Charles Wright  DOB: Jun 19, 1935  MRN: [id number]   Primary Cardiologist: Pixie Casino, MD  Chart reviewed as part of pre-operative protocol coverage. 85 year old male with history of CAD with NSTEMI in 2016, prior CVA, Afib on Eliquis, HTN, HLD, bladder and prostate cancer, and OSA on CPAP who we are asked to provide recommendations for holding Eliquis in preparation for robotic incisional hernia repair with mesh. Given patient's history of Afib and prior CVA, interruption of Eliquis should be minimized as much as possible, preferably 24-hour hold. Treating surgeon requests a 2-day hold of Eliquis prior to surgery. In this setting, it has been recommended the patient hold Eliquis for 2 days prior to surgery and receive a single dose of Lovenox on the day prior to surgery at the recommendation of his primary cardiologist. Eliquis should be resumed as soon as practical postoperatively per the surgical team.   Pre-op team, please get the patient scheduled for a pharmacy visit to get prescription for Lovenox and to go over instructions.   I will route this message to the requesting office with the above recommendations.      Procedure:Robotic incisional hernia repair with mesh  Date of procedure:TBD  CHA2DS2-VAScScore = 6 This indicates a9.7% annual risk of stroke. The patient's score is based upon: CHF History: No HTN History: Yes Diabetes History: No Stroke History: Yes Vascular Disease History: Yes Age Score: 2 Gender Score: 0  CrCl22mL/min Platelet count304K    Christell Faith, PA-C 07/04/2020, 7:54 AM

## 2020-07-04 NOTE — Telephone Encounter (Signed)
Routed to [place] team to make aware pt will need an appt for Lovenox bridging prior to surgery.  Will route back to requesting surgeon's office to make them aware to let us know when surgery is scheduled so this can be taken care of.

## 2020-07-08 NOTE — Telephone Encounter (Signed)
I s/w April, Triage nurse for Dr. Rosendo Gros. We discussed about the hold time for Eliquis for pt's surgery. Per April, RN Dr. Rosendo Gros is requesting hold time for Eliquis x 2 days. I did inform RN that the pt will then need to be set up for Lovenox Bridging. I asked if they had a surgery date in mind yet for the pt. RN states no surgery date yet though is in process. I asked if once they have a surgery date to please fax a note to our office [phone] attn Pre Op Team, so that our Pharm-D will be able to set pt up with Lovenox Bridge. I thanked April, RN for her help. As FYI I will fax these notes to surgeon's office to keep notes updated for all providers involved.

## 2020-07-09 NOTE — Telephone Encounter (Signed)
Received an staff message from [place], [place] @ [facility] letting me know that per Dr. Rosendo Gros, he would like for the pt to hold his Eliquis for 3 days.  I have let her know that we are waiting on a surgery date so we can assist pt in an appt for Lovenox Bridge.

## 2020-07-11 NOTE — Telephone Encounter (Signed)
Called patient and left message on machine.

## 2020-07-11 NOTE — Telephone Encounter (Signed)
Received a staff message from [place], [place] @ [facility], pt is scheduled for surgery 08/20/2020.  Will route back to preop and pharm preop for assistance in Lovenox bridging.

## 2020-07-14 NOTE — Telephone Encounter (Signed)
Original clearance was for a 2 day Eliquis hold with a single Lovenox dose.  If surgeon needs a three day hold, will need authorization from Dr Debara Pickett

## 2020-07-18 NOTE — Telephone Encounter (Signed)
Called and LVM for patient to call back to schedule apt with PharmD for bridge.

## 2020-07-21 NOTE — Telephone Encounter (Signed)
My chart message also sent to patient about scheduling apt since phone calls have not been returned.

## 2020-07-24 NOTE — Telephone Encounter (Signed)
Patient scheduled for 7/5 @ 1:30

## 2020-07-31 ENCOUNTER — Telehealth: Payer: Self-pay | Admitting: Internal Medicine

## 2020-07-31 NOTE — Telephone Encounter (Signed)
STAT if patient feels like he/she is going to faint   Are you dizzy now?  Not currently  Do you feel faint or have you passed out?  Not currently, but patient has been feeling dizzy/faint on and off all day  Do you have any other symptoms?  Low HR  Have you checked your HR and BP (record if available)?  No log available, but patient's wife states the patient's HR has been ranging 45-79. She states he's taken his losartan, metoprolol, and eliquis as prescribed and his heart has been skipping every 4th beat.  STAT if HR is under 50 or over 120 (normal HR is 60-100 beats per minute)  What is your heart rate?  No log available, but patient's wife states the patient's HR has been ranging 45-79.  Do you have a log of your heart rate readings (document readings)?  No  Do you have any other symptoms?  No

## 2020-07-31 NOTE — Telephone Encounter (Signed)
Called patient, he states that today he has been lightheaded, and a little dizzy at times when moving positions quickly. He states his BP has been all over the place and his HR has been ranging from 46-76. His BP/HR was checked while on the phone with me and it was 125/79 HR 64. Patient wife states that it notifies them when he has a irregular HR. Last OV with Dr.Hilty noted that he was having PVC's at the last visit, and they are causing him issues now. I advised of ED precautions over night but I would send a message to Dr.Hilty to review and get back with them on his suggestions. He mentioned doubling up on medications, but I strongly encouraged him not to do this or make any medications adjustments until we heard from Dr.Hilty- these medications could drop his HR even lower. Patient and wife verbalized understanding.  No other symptoms at this time other than dizziness/lightheadedness.

## 2020-08-01 NOTE — Telephone Encounter (Signed)
This could be afib or PVC's - really needs an office visit and EKG to evaluate  - if afib, may need cardioversion - if PVC's , might need anti-arrhythmic medication. Can see me or APP, whatever is first available.  Thanks,  Dr Lemmie Evens

## 2020-08-01 NOTE — Telephone Encounter (Signed)
The first APP appointment that is open is starting in August- just wanting to make sure this was okay to wait for or if you wanted something done quicker.  Thanks!

## 2020-08-04 NOTE — Telephone Encounter (Signed)
Hey, can you help get him set up for a nurse office visit? I thought you would want to schedule it when it worked for you.  Thanks!

## 2020-08-04 NOTE — Telephone Encounter (Signed)
Left message to call back in regards to message below & MyChart message sent to patient this morning (copied)  Me to Charles Wright       9:40 AM Hello Charles Wright -   In regards to your message from last week...   Dr. Debara Pickett can see you in the office at our [place] location tomorrow at 2:15pm or 3:45pm OR I can do an EKG check on Friday 7/1 in the afternoon. Dr. Debara Pickett is not in the office this day so another provider would have to review the report.   Address for [facility] location: [address], [place] [address]   Please let me know your preference.   Thanks [name]., RN  This MyChart message has not been read.

## 2020-08-12 ENCOUNTER — Other Ambulatory Visit: Payer: Self-pay | Admitting: Pharmacist

## 2020-08-12 ENCOUNTER — Ambulatory Visit (INDEPENDENT_AMBULATORY_CARE_PROVIDER_SITE_OTHER): Payer: Medicare Other | Admitting: Pharmacist

## 2020-08-12 ENCOUNTER — Other Ambulatory Visit: Payer: Self-pay

## 2020-08-12 VITALS — Wt 192.0 lb

## 2020-08-12 DIAGNOSIS — Z7901 Long term (current) use of anticoagulants: Secondary | ICD-10-CM | POA: Diagnosis not present

## 2020-08-12 MED ORDER — ENOXAPARIN SODIUM 120 MG/0.8ML IJ SOSY
120.0000 mg | PREFILLED_SYRINGE | INTRAMUSCULAR | 0 refills | Status: DC
Start: 2020-08-12 — End: 2021-05-14

## 2020-08-12 NOTE — Patient Instructions (Addendum)
7/9 - Take Eliquis twice daily like normal, take your evening dose a bit earlier at 10pm.  7/10 - Take Eliquis at 10am. Inject Lovenox 120mg  in the fatty tissue of your abdomen at 9pm.  7/11 - No Eliquis. Inject Lovenox 120mg  subcutaneously at 9pm.  7/12 - No Eliquis, no Lovenox.  7/13 - procedure date. No Lovenox or Eliquis before procedure. Discuss with Dr Rosendo Gros if you can restart Eliquis this evening or tomorrow.

## 2020-08-12 NOTE — Progress Notes (Addendum)
Pt scheduled for robotic incisional hernia repair with mesh on 08/20/20 with request to hold Eliquis for 3 days prior to procedure. Pt has hx of afib with CHADS2VASc score of 6 (age x2, HTN, vascular disease, and stroke). Due to elevated cardiovascular risk and need for > 24 hour DOAC hold, pt will be bridged with Lovenox while he is off of Eliquis pre-op.   Pt weighs 194 lbs in clinic today, home weights are 190 lbs consistently (has heavier shoes on today, phone in pocket etc). Using home weight of 190 lbs (86kg), will dose at 1.5mg /kg daily = 129.5, will round down to nearest syringe size of 120mg . Pt currently takes Eliquis doses at 1pm and midnight. Plt stable 304K, Hgb stable 13.2.  Below instructions provided to pt at visit today:  7/9 - Take Eliquis twice daily as usual but move evening dose up to 10pm.  7/10 - Last dose of Eliquis at 10am. Inject Lovenox 120mg  subcutaneously at 9pm. No evening Eliquis.  7/11 - No Eliquis. Inject Lovenox 120mg  subcutaneously at 9pm.  7/12 - No Eliquis, no Lovenox.  7/13 - procedure date. No Lovenox or Eliquis before procedure. Discuss with Dr Rosendo Gros if you can restart Eliquis this evening or tomorrow.  SCr improved to 1.35 on most recent labs from 05/07/20 which would qualify pt for 5mg  BID Eliquis dosing since weight is also > 60kg. Prior SCr 1.77, 2.12, 1.7 from 2021. Will see if BMET is checked with pre-op appt tomorrow and re-evaluate Eliquis dosing at that time.  Charles Wright, PharmD, BCACP, [facility] [address] [place], [place] [address] Phone: [phone]; Fax: [phone] 08/12/2020 2:10 PM  Addendum: SCr checked with pre-op labs, increased back to 1.57. Eliquis 2.5mg  BID remains correct dose for pt. I called to discuss this with pt, he is aware.

## 2020-08-12 NOTE — Progress Notes (Signed)
Surgical Instructions    Your procedure is scheduled on Wednesday, August 20, 2020.  Report to [facility] Main Entrance "A" at 08:00 A.M., then check in with the Admitting office.  Call this number if you have problems the morning of surgery:  [phone]   If you have any questions prior to your surgery date call [phone]: Open Monday-Friday 8am-4pm    Remember:  Do not eat after midnight the night before your surgery  You may drink clear liquids until 07:00 the morning of your surgery.   Clear liquids allowed are: Water, Non-Citrus Juices (without pulp), Carbonated Beverages, Clear Tea, Black Coffee Only, and Gatorade  Patient Instructions  The night before surgery:  No food after midnight. ONLY clear liquids after midnight  The day of surgery (if you do NOT have diabetes):  Drink ONE (1) Pre-Surgery Clear Ensure by 07:00 the morning of surgery. Drink in one sitting. Do not sip.  This drink was given to you during your hospital  pre-op appointment visit.  Nothing else to drink after completing the  Pre-Surgery Clear Ensure.          If you have questions, please contact your surgeon's office.     Take these medicines the morning of surgery with A SIP OF WATER:  allopurinol (ZYLOPRIM)  levothyroxine (SYNTHROID, LEVOTHROID) metoprolol tartrate (LOPRESSOR pravastatin (PRAVACHOL)  If needed:  acetaminophen (TYLENOL)  Carboxymethylcellul-Glycerin EPINEPHrine  nitroGLYCERIN (NITROSTAT)   Follow your surgeon's instructions on when to stop Aspirin and Eliquis.  If no instructions were given by your surgeon then you will need to call the office to get those instructions.     As of today, STOP taking any Aspirin (unless otherwise instructed by your surgeon) Aleve, Naproxen, Ibuprofen, Motrin, Advil, Goody's, BC's, all herbal medications, fish oil, and all vitamins.          Do not wear jewelry  Do not wear lotions, powders, colognes, or deodorant. Men may shave face  and neck. Do not bring valuables to the hospital. DO Not wear nail polish, gel polish, artificial nails, or any other type of covering on natural nails including finger and toenails. If patients have artificial nails, gel coating, etc. that need to be removed by a nail salon please have this removed prior to surgery or surgery may need to be canceled/delayed if the surgeon/ anesthesia feels like the patient is unable to be adequately monitored.             [facility] is not responsible for any belongings or valuables.  Do NOT Smoke (Tobacco/Vaping) or drink Alcohol 24 hours prior to your procedure If you use a CPAP at night, you may bring all equipment for your overnight stay.   Contacts, glasses, dentures or bridgework may not be worn into surgery, please bring cases for these belongings   For patients admitted to the hospital, discharge time will be determined by your treatment team.   Patients discharged the day of surgery will not be allowed to drive home, and someone needs to stay with them for 24 hours.  ONLY 1 SUPPORT PERSON MAY BE PRESENT WHILE YOU ARE IN SURGERY. IF YOU ARE TO BE ADMITTED ONCE YOU ARE IN YOUR ROOM YOU WILL BE ALLOWED TWO (2) VISITORS.  Minor children may have two parents present. Special consideration for safety and communication needs will be reviewed on a case by case basis.  Special instructions:    Oral Hygiene is also important to reduce your risk of infection.  Remember - BRUSH YOUR TEETH THE MORNING OF SURGERY WITH YOUR REGULAR TOOTHPASTE   [facility]- Preparing For Surgery  Before surgery, you can play an important role. Because skin is not sterile, your skin needs to be as free of germs as possible. You can reduce the number of germs on your skin by washing with CHG (chlorahexidine gluconate) Soap before surgery.  CHG is an antiseptic cleaner which kills germs and bonds with the skin to continue killing germs even after washing.     Please do not use  if you have an allergy to CHG or antibacterial soaps. If your skin becomes reddened/irritated stop using the CHG.  Do not shave (including legs and underarms) for at least 48 hours prior to first CHG shower. It is OK to shave your face.  Please follow these instructions carefully.     Shower the NIGHT BEFORE SURGERY and the MORNING OF SURGERY with CHG Soap.   If you chose to wash your hair, wash your hair first as usual with your normal shampoo. After you shampoo, rinse your hair and body thoroughly to remove the shampoo.  Then [employer] and genitals (private parts) with your normal soap and rinse thoroughly to remove soap.  After that Use CHG Soap as you would any other liquid soap. You can apply CHG directly to the skin and wash gently with a scrungie or a clean washcloth.   Apply the CHG Soap to your body ONLY FROM THE NECK DOWN.  Do not use on open wounds or open sores. Avoid contact with your eyes, ears, mouth and genitals (private parts). Wash Face and genitals (private parts)  with your normal soap.   Wash thoroughly, paying special attention to the area where your surgery will be performed.  Thoroughly rinse your body with warm water from the neck down.  DO NOT shower/wash with your normal soap after using and rinsing off the CHG Soap.  Pat yourself dry with a CLEAN TOWEL.  Wear CLEAN PAJAMAS to bed the night before surgery  Place CLEAN SHEETS on your bed the night before your surgery  DO NOT SLEEP WITH PETS.   Day of Surgery:  Take a shower with CHG soap. Wear Clean/Comfortable clothing the morning of surgery Do not apply any deodorants/lotions.   Remember to brush your teeth WITH YOUR REGULAR TOOTHPASTE.   Please read over the following fact sheets that you were given.

## 2020-08-13 ENCOUNTER — Encounter (HOSPITAL_COMMUNITY)
Admission: RE | Admit: 2020-08-13 | Discharge: 2020-08-13 | Disposition: A | Discharge status: Home / Self Care | Payer: Medicare Other | Source: Ambulatory Visit | Attending: General Surgery | Admitting: General Surgery

## 2020-08-13 ENCOUNTER — Encounter (HOSPITAL_COMMUNITY): Payer: Self-pay

## 2020-08-13 DIAGNOSIS — Z01812 Encounter for preprocedural laboratory examination: Secondary | ICD-10-CM | POA: Insufficient documentation

## 2020-08-13 LAB — COMPREHENSIVE METABOLIC PANEL
ALT: 17 U/L (ref 0–44)
AST: 24 U/L (ref 15–41)
Albumin: 3.6 g/dL (ref 3.5–5.0)
Alkaline Phosphatase: 63 U/L (ref 38–126)
Anion gap: 6 (ref 5–15)
BUN: 22 mg/dL (ref 8–23)
CO2: 26 mmol/L (ref 22–32)
Calcium: 9.5 mg/dL (ref 8.9–10.3)
Chloride: 102 mmol/L (ref 98–111)
Creatinine, Ser: 1.57 mg/dL — ABNORMAL HIGH (ref 0.61–1.24)
GFR, Estimated: 43 mL/min — ABNORMAL LOW (ref >60)
Glucose, Bld: 100 mg/dL — ABNORMAL HIGH (ref 70–99)
Potassium: 4.5 mmol/L (ref 3.5–5.1)
Sodium: 134 mmol/L — ABNORMAL LOW (ref 135–145)
Total Bilirubin: 1.6 mg/dL — ABNORMAL HIGH (ref 0.3–1.2)
Total Protein: 6.4 g/dL — ABNORMAL LOW (ref 6.5–8.1)

## 2020-08-13 LAB — CBC
HCT: 45.2 % (ref 39.0–52.0)
Hemoglobin: 13.9 g/dL (ref 13.0–17.0)
MCH: 25.3 pg — ABNORMAL LOW (ref 26.0–34.0)
MCHC: 30.8 g/dL (ref 30.0–36.0)
MCV: 82.2 fL (ref 80.0–100.0)
Platelets: 280 10*3/uL (ref 150–400)
RBC: 5.5 MIL/uL (ref 4.22–5.81)
RDW: 17.7 % — ABNORMAL HIGH (ref 11.5–15.5)
WBC: 10.6 10*3/uL — ABNORMAL HIGH (ref 4.0–10.5)
nRBC: 0 % (ref 0.0–0.2)

## 2020-08-13 NOTE — Telephone Encounter (Signed)
Prescription refill request for Eliquis received.  Indication: afib Last office visit: 04/18/2020, Hilty Scr: 1.77, 10/23/2019 Age: 85 yo  Weight: 87.1 kg   Prescription refill sent for Eliquis 2.5mg  BID.

## 2020-08-13 NOTE — Progress Notes (Addendum)
PCP - Maury Dus MD Cardiologist - Lyman Bishop MD  PPM/ICD - denies Device Orders -  Rep Notified -   Chest x-ray - 04/09/20 EKG - 04/18/20 Stress Test - 11/09/19 ECHO - 05/28/16 Cardiac Cath - 12/13/14  Sleep Study - 10/18/05 CPAP - no  Fasting Blood Sugar - n/a Checks Blood Sugar _____ times a day  Blood Thinner Instructions: per surgeon's instructions.See 08/12/20 pharmacist's note.  Aspirin Instructions: patient to call surgeon  ERAS Protcol -clear liquids until 0700 PRE-SURGERY Ensure or G2- Ensure  COVID TEST- n/a ambulatory surgery   Anesthesia review: yes-cardiac history  Patient denies shortness of breath, fever, cough and chest pain at PAT appointment   All instructions explained to the patient, with a verbal understanding of the material. Patient agrees to go over the instructions while at home for a better understanding. Patient also instructed to self quarantine after being tested for COVID-19. The opportunity to ask questions was provided.

## 2020-08-14 NOTE — Anesthesia Preprocedure Evaluation (Addendum)
Anesthesia Evaluation  Patient identified by MRN, date of birth, ID band Patient awake    Reviewed: Allergy & Precautions, H&P , NPO status , Patient's Chart, lab work & pertinent test results  Airway Mallampati: II  TM Distance: >3 FB Neck ROM: Full    Dental no notable dental hx. (+) Teeth Intact, Dental Advisory Given   Pulmonary sleep apnea ,    Pulmonary exam normal breath sounds clear to auscultation       Cardiovascular hypertension, + CAD and + Past MI   Rhythm:Regular Rate:Normal     Neuro/Psych negative neurological ROS  negative psych ROS   GI/Hepatic Neg liver ROS, GERD  ,  Endo/Other  Hypothyroidism   Renal/GU Renal InsufficiencyRenal disease  negative genitourinary   Musculoskeletal  (+) Arthritis , Osteoarthritis,    Abdominal   Peds  Hematology negative hematology ROS (+)   Anesthesia Other Findings   Reproductive/Obstetrics negative OB ROS                           Anesthesia Physical Anesthesia Plan  ASA: 3  Anesthesia Plan: General   Post-op Pain Management:    Induction: Intravenous  PONV Risk Score and Plan: 3 and Ondansetron, Dexamethasone and Treatment may vary due to age or medical condition  Airway Management Planned: Oral ETT  Additional Equipment:   Intra-op Plan:   Post-operative Plan: Extubation in OR  Informed Consent: I have reviewed the patients History and Physical, chart, labs and discussed the procedure including the risks, benefits and alternatives for the proposed anesthesia with the patient or authorized representative who has indicated his/her understanding and acceptance.     Dental advisory given  Plan Discussed with: CRNA  Anesthesia Plan Comments: (PAT note by Karoline Caldwell, PA-C:  CAD with NSTEMI in 2016 (cath showed 90% LAD distal stenosis and 50 to 60% mid LAD stenosis of a tortuous vessel, medical management was recommended  due to lack of approachable PCI target), prior CVA, paroxysmal Afib on Eliquis, failed watchman procedure 2016, frequent PVCs, interstitial lung disease, HTN, HLD, bladder and prostate cancer, CKD 3, and OSA on CPAP.  Most recent nuclear stress test 11/09/2019 was low risk.  Primary cardiologist is Dr. Debara Pickett.  Per telephone encounter 07/03/2020, stated okay to proceed with surgery, recommended Lovenox bridge.  Lovenox bridge detailed in pharmacist note 08/12/2020.  Follows with pulmonologist Dr. Vaughan Browner for interstitial lung disease.  Per note 02/28/2020, "Review of CT scan shows probable UIP pattern fibrosis. He has minimal changes at the base dating back to at least 2012 on CTs of the abdomen. It is more clearly progressive since his last CT abdomen in 2020. This is suggestive of IPF in the absence of any exposures or CTD symptoms."  PFTs at that time showed mild diffusion defect.  Patient was not interested in biopsy or treatment with antifibrotics at that time, 28-month follow-up recommended.  Preop labs reviewed, creatinine elevated 1.57 consistent with history of CKD, labs otherwise unremarkable.  EKG 04/18/2020: Sinus rhythm with frequent PVCs.  Rate 60.  High-resolution CT chest 05/12/2020: IMPRESSION: 1. No significant interval change in a pattern of mild pulmonary fibrosis with apical to basal gradient featuring irregular peripheral interstitial opacity and septal thickening, minimal traction bronchiectasis, and small areas of subpleural bronchiolectasis in the dependent lung bases. Findings remain with a "probable UIP "pattern by ATS pulmonary fibrosis criteria. Findings are categorized as probable UIP per consensus guidelines: Diagnosis of Idiopathic Pulmonary Fibrosis:  An Official ATS/ERS/JRS/ALAT Clinical Practice Guideline. [place], Iss 5, [phone], Oct 09 2016. 2. Stable small pulmonary nodules of the left upper lobe measuring 5 mm or smaller. Additional  follow-up as indicated by clinical oncology protocol given history of multiple malignancies. 3. Coronary artery disease. 4. Punctuate nonobstructive calculus of the superior pole of the right kidney.  PFTs 03/07/2020: FVC3.73 (105%), FEV1 2.94 (119%), F/F 79, TLC 5.38 (81%), DLCO 15.99 (71%) Mild diffusion defect  Nuclear stress 11/09/19: . Nuclear stress EF: 54%. . The left ventricular ejection fraction is mildly decreased (45-54%). . There was no ST segment deviation noted during stress. . Defect 1: There is a small defect of moderate severity present in the apical inferior and apex location. . This is a low risk study.  Mildly abnormal, low risk stress nuclear study with apical thinning vs small prior infarct; no ischemia; EF 54 with normal wall motion.  TTE 05/31/2016: Conclusion: 1.  Left ventricle cavity is normal in size.  Mild to moderate concentric hypertrophy of left ventricle.  Normal wall motion.  Calculated EF 55%. 2.  Left atrial cavity is mildly dilated. 3.  Mild (grade 1) aortic regurgitation. 4.  Trace mitral regurgitation. 5.  Trace pulmonic regurgitation. )       Anesthesia Quick Evaluation

## 2020-08-14 NOTE — Progress Notes (Signed)
Anesthesia Chart Review:  CAD with NSTEMI in 2016 (cath showed 90% LAD distal stenosis and 50 to 60% mid LAD stenosis of a tortuous vessel, medical management was recommended due to lack of approachable PCI target), prior CVA, paroxysmal Afib on Eliquis, failed watchman procedure 2016, frequent PVCs, interstitial lung disease, HTN, HLD, bladder and prostate cancer, CKD 3, and OSA on CPAP.  Most recent nuclear stress test 11/09/2019 was low risk.  Primary cardiologist is Dr. Debara Pickett.  Per telephone encounter 07/03/2020, stated okay to proceed with surgery, recommended Lovenox bridge.  Lovenox bridge detailed in pharmacist note 08/12/2020.  Follows with pulmonologist Dr. Vaughan Browner for interstitial lung disease.  Per note 02/28/2020, "Review of CT scan shows probable UIP pattern fibrosis.  He has minimal changes at the base dating back to at least 2012 on CTs of the abdomen.  It is more clearly progressive since his last CT abdomen in 2020.  This is suggestive of IPF in the absence of any exposures or CTD symptoms."  PFTs at that time showed mild diffusion defect.  Patient was not interested in biopsy or treatment with antifibrotics at that time, 32-month follow-up recommended.  Preop labs reviewed, creatinine elevated 1.57 consistent with history of CKD, labs otherwise unremarkable.  EKG 04/18/2020: Sinus rhythm with frequent PVCs.  Rate 60.  High-resolution CT chest 05/12/2020: IMPRESSION: 1. No significant interval change in a pattern of mild pulmonary fibrosis with apical to basal gradient featuring irregular peripheral interstitial opacity and septal thickening, minimal traction bronchiectasis, and small areas of subpleural bronchiolectasis in the dependent lung bases. Findings remain with a "probable UIP "pattern by ATS pulmonary fibrosis criteria. Findings are categorized as probable UIP per consensus guidelines: Diagnosis of Idiopathic Pulmonary Fibrosis: An Official ATS/ERS/JRS/ALAT Clinical Practice  Guideline. [place], Iss 5, [phone], Oct 09 2016. 2. Stable small pulmonary nodules of the left upper lobe measuring 5 mm or smaller. Additional follow-up as indicated by clinical oncology protocol given history of multiple malignancies. 3. Coronary artery disease. 4. Punctuate nonobstructive calculus of the superior pole of the right kidney.  PFTs 03/07/2020: FVC 3.73 [105%], FEV1 2.94 (119%], F/F 79, TLC 5.38 [81%], DLCO 15.99 [71%] Mild diffusion defect  Nuclear stress 11/09/19: Nuclear stress EF: 54%. The left ventricular ejection fraction is mildly decreased (45-54%). There was no ST segment deviation noted during stress. Defect 1: There is a small defect of moderate severity present in the apical inferior and apex location. This is a low risk study.   Mildly abnormal, low risk stress nuclear study with apical thinning vs small prior infarct; no ischemia; EF 54 with normal wall motion.  TTE 05/31/2016: Conclusion: 1.  Left ventricle cavity is normal in size.  Mild to moderate concentric hypertrophy of left ventricle.  Normal wall motion.  Calculated EF 55%. 2.  Left atrial cavity is mildly dilated. 3.  Mild (grade 1) aortic regurgitation. 4.  Trace mitral regurgitation. 5.  Trace pulmonic regurgitation.   Wynonia Musty [facility] Short Stay Center/Anesthesiology Phone [phone] 08/14/2020 4:21 PM

## 2020-08-18 DIAGNOSIS — D225 Melanocytic nevi of trunk: Secondary | ICD-10-CM | POA: Diagnosis not present

## 2020-08-18 DIAGNOSIS — L821 Other seborrheic keratosis: Secondary | ICD-10-CM | POA: Diagnosis not present

## 2020-08-18 DIAGNOSIS — Z85828 Personal history of other malignant neoplasm of skin: Secondary | ICD-10-CM | POA: Diagnosis not present

## 2020-08-18 DIAGNOSIS — L905 Scar conditions and fibrosis of skin: Secondary | ICD-10-CM | POA: Diagnosis not present

## 2020-08-20 ENCOUNTER — Observation Stay (HOSPITAL_COMMUNITY)
Admission: RE | Admit: 2020-08-20 | Discharge: 2020-08-21 | Disposition: A | Discharge status: Home / Self Care | Payer: Medicare Other | Source: Ambulatory Visit | Attending: General Surgery | Admitting: General Surgery

## 2020-08-20 ENCOUNTER — Ambulatory Visit (HOSPITAL_COMMUNITY): Payer: Medicare Other

## 2020-08-20 ENCOUNTER — Other Ambulatory Visit: Payer: Self-pay

## 2020-08-20 ENCOUNTER — Encounter (HOSPITAL_COMMUNITY): Payer: Self-pay | Admitting: General Surgery

## 2020-08-20 ENCOUNTER — Ambulatory Visit (HOSPITAL_COMMUNITY): Payer: Medicare Other | Admitting: Physician Assistant

## 2020-08-20 ENCOUNTER — Encounter (HOSPITAL_COMMUNITY)
Admission: RE | Disposition: A | Discharge status: Home / Self Care | Payer: Self-pay | Source: Ambulatory Visit | Attending: General Surgery

## 2020-08-20 DIAGNOSIS — E785 Hyperlipidemia, unspecified: Secondary | ICD-10-CM | POA: Diagnosis not present

## 2020-08-20 DIAGNOSIS — Z9889 Other specified postprocedural states: Secondary | ICD-10-CM

## 2020-08-20 DIAGNOSIS — Z8551 Personal history of malignant neoplasm of bladder: Secondary | ICD-10-CM | POA: Diagnosis not present

## 2020-08-20 DIAGNOSIS — I480 Paroxysmal atrial fibrillation: Secondary | ICD-10-CM | POA: Diagnosis not present

## 2020-08-20 DIAGNOSIS — K432 Incisional hernia without obstruction or gangrene: Secondary | ICD-10-CM | POA: Diagnosis not present

## 2020-08-20 DIAGNOSIS — Z7901 Long term (current) use of anticoagulants: Secondary | ICD-10-CM | POA: Insufficient documentation

## 2020-08-20 DIAGNOSIS — I4891 Unspecified atrial fibrillation: Secondary | ICD-10-CM | POA: Insufficient documentation

## 2020-08-20 DIAGNOSIS — G4733 Obstructive sleep apnea (adult) (pediatric): Secondary | ICD-10-CM | POA: Diagnosis not present

## 2020-08-20 DIAGNOSIS — Z8719 Personal history of other diseases of the digestive system: Secondary | ICD-10-CM

## 2020-08-20 DIAGNOSIS — Z8616 Personal history of COVID-19: Secondary | ICD-10-CM | POA: Diagnosis not present

## 2020-08-20 HISTORY — PX: XI ROBOTIC ASSISTED VENTRAL HERNIA: SHX6789

## 2020-08-20 HISTORY — PX: INSERTION OF MESH: SHX5868

## 2020-08-20 SURGERY — REPAIR, HERNIA, VENTRAL, ROBOT-ASSISTED
Anesthesia: General | Site: Abdomen

## 2020-08-20 MED ORDER — PROPOFOL 10 MG/ML IV BOLUS
INTRAVENOUS | Status: AC
  Filled 2020-08-20: qty 20

## 2020-08-20 MED ORDER — FENTANYL CITRATE (PF) 250 MCG/5ML IJ SOLN
INTRAMUSCULAR | Status: DC | PRN
  Administered 2020-08-20 (×3): 50 ug via INTRAVENOUS
  Administered 2020-08-20: 100 ug via INTRAVENOUS

## 2020-08-20 MED ORDER — SODIUM CHLORIDE 0.9 % IV SOLN
INTRAVENOUS | Status: DC | PRN
  Administered 2020-08-20: 40 mL

## 2020-08-20 MED ORDER — DEXAMETHASONE SODIUM PHOSPHATE 10 MG/ML IJ SOLN
INTRAMUSCULAR | Status: AC
  Filled 2020-08-20: qty 1

## 2020-08-20 MED ORDER — ENOXAPARIN SODIUM 40 MG/0.4ML IJ SOSY
40.0000 mg | PREFILLED_SYRINGE | INTRAMUSCULAR | Status: DC
  Administered 2020-08-21: 40 mg via SUBCUTANEOUS
  Filled 2020-08-20: qty 0.4

## 2020-08-20 MED ORDER — DEXTROSE-NACL 5-0.9 % IV SOLN: INTRAVENOUS | Status: DC

## 2020-08-20 MED ORDER — HYDROMORPHONE HCL 1 MG/ML IJ SOLN: 1.0000 mg | INTRAMUSCULAR | Status: DC | PRN

## 2020-08-20 MED ORDER — ENSURE PRE-SURGERY PO LIQD: 592.0000 mL | Freq: Once | ORAL | Status: DC

## 2020-08-20 MED ORDER — LACTATED RINGERS IV SOLN: INTRAVENOUS | Status: DC

## 2020-08-20 MED ORDER — ONDANSETRON HCL 4 MG/2ML IJ SOLN
INTRAMUSCULAR | Status: AC
  Filled 2020-08-20: qty 2

## 2020-08-20 MED ORDER — ORAL CARE MOUTH RINSE: 15.0000 mL | Freq: Once | OROMUCOSAL | Status: AC

## 2020-08-20 MED ORDER — NITROGLYCERIN 0.4 MG SL SUBL: 0.4000 mg | SUBLINGUAL_TABLET | SUBLINGUAL | Status: DC | PRN

## 2020-08-20 MED ORDER — BUPIVACAINE HCL 0.25 % IJ SOLN
INTRAMUSCULAR | Status: DC | PRN
  Administered 2020-08-20: 5 mL

## 2020-08-20 MED ORDER — GABAPENTIN 300 MG PO CAPS
300.0000 mg | ORAL_CAPSULE | ORAL | Status: AC
  Administered 2020-08-20: 300 mg via ORAL
  Filled 2020-08-20: qty 1

## 2020-08-20 MED ORDER — TRAMADOL HCL 50 MG PO TABS
ORAL_TABLET | ORAL | Status: AC
  Filled 2020-08-20: qty 1

## 2020-08-20 MED ORDER — PHENYLEPHRINE HCL-NACL 10-0.9 MG/250ML-% IV SOLN
INTRAVENOUS | Status: DC | PRN
  Administered 2020-08-20: 40 ug/min via INTRAVENOUS

## 2020-08-20 MED ORDER — ALLOPURINOL 300 MG PO TABS
300.0000 mg | ORAL_TABLET | Freq: Every morning | ORAL | Status: DC
  Administered 2020-08-21: 300 mg via ORAL
  Filled 2020-08-20: qty 1

## 2020-08-20 MED ORDER — LIDOCAINE 2% (20 MG/ML) 5 ML SYRINGE
INTRAMUSCULAR | Status: DC | PRN
  Administered 2020-08-20: 60 mg via INTRAVENOUS

## 2020-08-20 MED ORDER — ONDANSETRON 4 MG PO TBDP: 4.0000 mg | ORAL_TABLET | Freq: Four times a day (QID) | ORAL | Status: DC | PRN

## 2020-08-20 MED ORDER — CHLORHEXIDINE GLUCONATE CLOTH 2 % EX PADS: 6.0000 | MEDICATED_PAD | Freq: Once | CUTANEOUS | Status: DC

## 2020-08-20 MED ORDER — TRAMADOL HCL 50 MG PO TABS: 50.0000 mg | ORAL_TABLET | Freq: Four times a day (QID) | ORAL | 0 refills | Status: DC | PRN

## 2020-08-20 MED ORDER — 0.9 % SODIUM CHLORIDE (POUR BTL) OPTIME
TOPICAL | Status: DC | PRN
  Administered 2020-08-20: 1000 mL

## 2020-08-20 MED ORDER — METOPROLOL TARTRATE 25 MG PO TABS
25.0000 mg | ORAL_TABLET | Freq: Two times a day (BID) | ORAL | Status: DC
  Administered 2020-08-20 – 2020-08-21 (×2): 25 mg via ORAL
  Filled 2020-08-20 (×2): qty 1

## 2020-08-20 MED ORDER — METHOCARBAMOL 500 MG PO TABS
ORAL_TABLET | ORAL | Status: AC
  Filled 2020-08-20: qty 1

## 2020-08-20 MED ORDER — ACETAMINOPHEN 500 MG PO TABS
500.0000 mg | ORAL_TABLET | Freq: Four times a day (QID) | ORAL | Status: DC | PRN
  Administered 2020-08-21 (×2): 500 mg via ORAL
  Filled 2020-08-20 (×2): qty 1

## 2020-08-20 MED ORDER — HYDROMORPHONE HCL 1 MG/ML IJ SOLN
0.2500 mg | INTRAMUSCULAR | Status: DC | PRN
  Administered 2020-08-20 (×2): 0.5 mg via INTRAVENOUS

## 2020-08-20 MED ORDER — PANTOPRAZOLE SODIUM 40 MG IV SOLR
40.0000 mg | Freq: Every day | INTRAVENOUS | Status: DC
  Administered 2020-08-20: 40 mg via INTRAVENOUS
  Filled 2020-08-20: qty 40

## 2020-08-20 MED ORDER — FENTANYL CITRATE (PF) 250 MCG/5ML IJ SOLN
INTRAMUSCULAR | Status: AC
  Filled 2020-08-20: qty 5

## 2020-08-20 MED ORDER — ACETAMINOPHEN 500 MG PO TABS
1000.0000 mg | ORAL_TABLET | ORAL | Status: AC
  Administered 2020-08-20: 1000 mg via ORAL
  Filled 2020-08-20: qty 2

## 2020-08-20 MED ORDER — HYDROMORPHONE HCL 1 MG/ML IJ SOLN
INTRAMUSCULAR | Status: AC
  Administered 2020-08-20: 0.5 mg via INTRAVENOUS
  Filled 2020-08-20: qty 2

## 2020-08-20 MED ORDER — SODIUM CHLORIDE (PF) 0.9 % IJ SOLN
INTRAMUSCULAR | Status: AC
  Filled 2020-08-20: qty 20

## 2020-08-20 MED ORDER — CHLORHEXIDINE GLUCONATE 0.12 % MT SOLN
15.0000 mL | Freq: Once | OROMUCOSAL | Status: AC
  Administered 2020-08-20: 15 mL via OROMUCOSAL
  Filled 2020-08-20: qty 15

## 2020-08-20 MED ORDER — BUPIVACAINE HCL (PF) 0.25 % IJ SOLN
INTRAMUSCULAR | Status: AC
  Filled 2020-08-20: qty 30

## 2020-08-20 MED ORDER — PHENYLEPHRINE 40 MCG/ML (10ML) SYRINGE FOR IV PUSH (FOR BLOOD PRESSURE SUPPORT)
PREFILLED_SYRINGE | INTRAVENOUS | Status: AC
  Filled 2020-08-20: qty 10

## 2020-08-20 MED ORDER — CEFAZOLIN SODIUM-DEXTROSE 2-4 GM/100ML-% IV SOLN
2.0000 g | INTRAVENOUS | Status: AC
  Administered 2020-08-20: 2 g via INTRAVENOUS
  Filled 2020-08-20: qty 100

## 2020-08-20 MED ORDER — SUGAMMADEX SODIUM 200 MG/2ML IV SOLN
INTRAVENOUS | Status: DC | PRN
  Administered 2020-08-20: 200 mg via INTRAVENOUS

## 2020-08-20 MED ORDER — ROCURONIUM BROMIDE 10 MG/ML (PF) SYRINGE
PREFILLED_SYRINGE | INTRAVENOUS | Status: DC | PRN
  Administered 2020-08-20: 60 mg via INTRAVENOUS
  Administered 2020-08-20: 5 mg via INTRAVENOUS
  Administered 2020-08-20: 10 mg via INTRAVENOUS

## 2020-08-20 MED ORDER — TRAMADOL HCL 50 MG PO TABS
50.0000 mg | ORAL_TABLET | Freq: Four times a day (QID) | ORAL | Status: DC | PRN
  Administered 2020-08-20 – 2020-08-21 (×3): 50 mg via ORAL
  Filled 2020-08-20 (×2): qty 1

## 2020-08-20 MED ORDER — ONDANSETRON HCL 4 MG/2ML IJ SOLN
INTRAMUSCULAR | Status: DC | PRN
  Administered 2020-08-20: 4 mg via INTRAVENOUS

## 2020-08-20 MED ORDER — BUPIVACAINE LIPOSOME 1.3 % IJ SUSP
INTRAMUSCULAR | Status: AC
  Filled 2020-08-20: qty 20

## 2020-08-20 MED ORDER — PROPOFOL 10 MG/ML IV BOLUS
INTRAVENOUS | Status: DC | PRN
  Administered 2020-08-20: 120 mg via INTRAVENOUS

## 2020-08-20 MED ORDER — ENSURE PRE-SURGERY PO LIQD: 296.0000 mL | Freq: Once | ORAL | Status: DC

## 2020-08-20 MED ORDER — DEXAMETHASONE SODIUM PHOSPHATE 10 MG/ML IJ SOLN
INTRAMUSCULAR | Status: DC | PRN
  Administered 2020-08-20: 10 mg via INTRAVENOUS

## 2020-08-20 MED ORDER — DIAZEPAM 5 MG PO TABS: 5.0000 mg | ORAL_TABLET | Freq: Four times a day (QID) | ORAL | Status: DC | PRN

## 2020-08-20 MED ORDER — ONDANSETRON HCL 4 MG/2ML IJ SOLN: 4.0000 mg | Freq: Four times a day (QID) | INTRAMUSCULAR | Status: DC | PRN

## 2020-08-20 MED ORDER — PHENYLEPHRINE 40 MCG/ML (10ML) SYRINGE FOR IV PUSH (FOR BLOOD PRESSURE SUPPORT)
PREFILLED_SYRINGE | INTRAVENOUS | Status: DC | PRN
  Administered 2020-08-20: 80 ug via INTRAVENOUS

## 2020-08-20 MED ORDER — LEVOTHYROXINE SODIUM 50 MCG PO TABS
50.0000 ug | ORAL_TABLET | Freq: Every day | ORAL | Status: DC
  Administered 2020-08-21: 50 ug via ORAL
  Filled 2020-08-20: qty 1

## 2020-08-20 MED ORDER — ONDANSETRON HCL 4 MG/2ML IJ SOLN: 4.0000 mg | Freq: Once | INTRAMUSCULAR | Status: DC | PRN

## 2020-08-20 MED ORDER — METHOCARBAMOL 500 MG PO TABS
500.0000 mg | ORAL_TABLET | Freq: Once | ORAL | Status: AC
  Administered 2020-08-20: 500 mg via ORAL

## 2020-08-20 SURGICAL SUPPLY — 62 items
ADH SKN CLS APL DERMABOND .7 (GAUZE/BANDAGES/DRESSINGS) ×1
APL LAPSCP 35 DL APL RGD (MISCELLANEOUS)
APPLICATOR VISTASEAL 35 (MISCELLANEOUS) IMPLANT
BAG COUNTER SPONGE SURGICOUNT (BAG) IMPLANT
BAG SPNG CNTER NS LX DISP (BAG)
CANNULA REDUC XI 12-8 STAPL (CANNULA) ×2
CANNULA REDUCER 12-8 DVNC XI (CANNULA) ×1 IMPLANT
CHLORAPREP W/TINT 26 (MISCELLANEOUS) ×2 IMPLANT
COVER MAYO STAND STRL (DRAPES) ×2 IMPLANT
COVER SURGICAL LIGHT HANDLE (MISCELLANEOUS) ×2 IMPLANT
COVER TIP SHEARS 8 DVNC (MISCELLANEOUS) ×1 IMPLANT
COVER TIP SHEARS 8MM DA VINCI (MISCELLANEOUS) ×2
DECANTER SPIKE VIAL GLASS SM (MISCELLANEOUS) ×2 IMPLANT
DEFOGGER SCOPE WARMER CLEARIFY (MISCELLANEOUS) ×2 IMPLANT
DERMABOND ADVANCED (GAUZE/BANDAGES/DRESSINGS) ×1
DERMABOND ADVANCED .7 DNX12 (GAUZE/BANDAGES/DRESSINGS) ×1 IMPLANT
DEVICE SECURE STRAP 25 ABSORB (INSTRUMENTS) IMPLANT
DEVICE TROCAR PUNCTURE CLOSURE (ENDOMECHANICALS) ×2 IMPLANT
DRAPE ARM DVNC X/XI (DISPOSABLE) ×4 IMPLANT
DRAPE CARDIOVASC SPLIT 88X140 (DRAPES) ×2 IMPLANT
DRAPE CARDIOVASCULAR INCISE (DRAPES)
DRAPE COLUMN DVNC XI (DISPOSABLE) ×1 IMPLANT
DRAPE CV SPLIT W-CLR ANES SCRN (DRAPES) ×2 IMPLANT
DRAPE DA VINCI XI ARM (DISPOSABLE) ×8
DRAPE DA VINCI XI COLUMN (DISPOSABLE) ×2
DRAPE ORTHO SPLIT 77X108 STRL (DRAPES) ×2
DRAPE SRG 135X102X78XABS (DRAPES) IMPLANT
DRAPE SURG ORHT 6 SPLT 77X108 (DRAPES) ×1 IMPLANT
GLOVE SURG ENC MOIS LTX SZ7.5 (GLOVE) ×4 IMPLANT
GOWN STRL REUS W/ TWL LRG LVL3 (GOWN DISPOSABLE) ×2 IMPLANT
GOWN STRL REUS W/ TWL XL LVL3 (GOWN DISPOSABLE) ×2 IMPLANT
GOWN STRL REUS W/TWL 2XL LVL3 (GOWN DISPOSABLE) ×2 IMPLANT
GOWN STRL REUS W/TWL LRG LVL3 (GOWN DISPOSABLE) ×4
GOWN STRL REUS W/TWL XL LVL3 (GOWN DISPOSABLE) ×4
KIT BASIN OR (CUSTOM PROCEDURE TRAY) ×2 IMPLANT
KIT TURNOVER KIT B (KITS) ×2 IMPLANT
LIGASURE IMPACT 36 18CM CVD LR (INSTRUMENTS) ×2 IMPLANT
MARKER SKIN DUAL TIP RULER LAB (MISCELLANEOUS) ×2 IMPLANT
MESH PROLENE PML 12X12 (Mesh General) ×2 IMPLANT
NEEDLE HYPO 22GX1.5 SAFETY (NEEDLE) ×2 IMPLANT
OBTURATOR OPTICAL STANDARD 8MM (TROCAR)
OBTURATOR OPTICAL STND 8 DVNC (TROCAR)
OBTURATOR OPTICALSTD 8 DVNC (TROCAR) IMPLANT
PAD ARMBOARD 7.5X6 YLW CONV (MISCELLANEOUS) ×4 IMPLANT
PENCIL SMOKE EVACUATOR (MISCELLANEOUS) IMPLANT
SCISSORS LAP 5X35 DISP (ENDOMECHANICALS) ×2 IMPLANT
SEAL CANN UNIV 5-8 DVNC XI (MISCELLANEOUS) ×3 IMPLANT
SEAL XI 5MM-8MM UNIVERSAL (MISCELLANEOUS) ×6
SET IRRIG TUBING LAPAROSCOPIC (IRRIGATION / IRRIGATOR) IMPLANT
SET TUBE SMOKE EVAC HIGH FLOW (TUBING) ×2 IMPLANT
STAPLER CANNULA SEAL DVNC XI (STAPLE) ×1 IMPLANT
STAPLER CANNULA SEAL XI (STAPLE) ×2
STOPCOCK 4 WAY LG BORE MALE ST (IV SETS) ×2 IMPLANT
SUT DVC VLOC 180 0 12IN GS21 (SUTURE) ×4
SUT MNCRL AB 4-0 PS2 18 (SUTURE) ×2 IMPLANT
SUT VLOC 180 0 9IN  GS21 (SUTURE) ×2
SUT VLOC 180 0 9IN GS21 (SUTURE) ×1 IMPLANT
SUT VLOC 180 2-0 9IN GS21 (SUTURE) IMPLANT
SUTURE DVC VLC 180 0 12IN GS21 (SUTURE) ×2 IMPLANT
TOWEL GREEN STERILE FF (TOWEL DISPOSABLE) ×2 IMPLANT
TRAY LAPAROSCOPIC MC (CUSTOM PROCEDURE TRAY) IMPLANT
TROCAR XCEL NON-BLD 5MMX100MML (ENDOMECHANICALS) IMPLANT

## 2020-08-20 NOTE — Progress Notes (Signed)
S/w PACU RN and pt still requiring O2 to keep sats up >90%. Pt not compliant with CPAP at home. Will admit for O2 tx and cont' pulse ox Pulm toilet  Hopefully if able to wean off O2 tomorrow, may be able to DC home.

## 2020-08-20 NOTE — Discharge Instructions (Signed)
[facility] _______[facility]  HERNIA REPAIR: POST OP INSTRUCTIONS  Always review your discharge instruction sheet given to you by the facility where your surgery was performed. IF YOU HAVE DISABILITY OR FAMILY LEAVE FORMS, YOU MUST BRING THEM TO THE OFFICE FOR PROCESSING.   DO NOT GIVE THEM TO YOUR DOCTOR.  1. A  prescription for pain medication may be given to you upon discharge.  Take your pain medication as prescribed, if needed.  If narcotic pain medicine is not needed, then you may take acetaminophen (Tylenol) or ibuprofen (Advil) as needed. 2. Take your usually prescribed medications unless otherwise directed. If you need a refill on your pain medication, please contact your pharmacy.  They will contact our office to request authorization. Prescriptions will not be filled after 5 pm or on week-ends. 3. You should follow a light diet the first 24 hours after arrival home, such as soup and crackers, etc.  Be sure to include lots of fluids daily.  Resume your normal diet the day after surgery. 4.Most patients will experience some swelling and bruising around the umbilicus or in the groin and scrotum.  Ice packs and reclining will help.  Swelling and bruising can take several days to resolve.  6. It is common to experience some constipation if taking pain medication after surgery.  Increasing fluid intake and taking a stool softener (such as Colace) will usually help or prevent this problem from occurring.  A mild laxative (Milk of Magnesia or Miralax) should be taken according to package directions if there are no bowel movements after 48 hours. 7. Unless discharge instructions indicate otherwise, you may remove your bandages 24-48 hours after surgery, and you may shower at that time.  You may have steri-strips (small skin tapes) in place directly over the incision.  These strips should be left on the skin for 7-10 days.  If your surgeon used skin glue on the incision, you may shower in  24 hours.  The glue will flake off over the next 2-3 weeks.  Any sutures or staples will be removed at the office during your follow-up visit. 8. ACTIVITIES:  You may resume regular (light) daily activities beginning the next day--such as daily self-care, walking, climbing stairs--gradually increasing activities as tolerated.  You may have sexual intercourse when it is comfortable.  Refrain from any heavy lifting or straining until approved by your doctor.  a.You may drive when you are no longer taking prescription pain medication, you can comfortably wear a seatbelt, and you can safely maneuver your car and apply brakes. b.RETURN TO WORK:   _____________________________________________  9.You should see your doctor in the office for a follow-up appointment approximately 2-3 weeks after your surgery.  Make sure that you call for this appointment within a day or two after you arrive home to insure a convenient appointment time. 10.OTHER INSTRUCTIONS: _________________________    _____________________________________  WHEN TO CALL YOUR DOCTOR: Fever over 101.0 Inability to urinate Nausea and/or vomiting Extreme swelling or bruising Continued bleeding from incision. Increased pain, redness, or drainage from the incision  The clinic staff is available to answer your questions during regular business hours.  Please don't hesitate to call and ask to speak to one of the nurses for clinical concerns.  If you have a medical emergency, go to the nearest emergency room or call 911.  A surgeon from [facility] is always on call at the hospital   [address], [address], [place], [place]    [address] ?  [address] [address], [place], [place]   [address] [phone] ? [phone] ? FAX [phone] Web site: [email or web address]  

## 2020-08-20 NOTE — Op Note (Signed)
08/20/2020  12:08 PM  PATIENT:  Charles Wright  85 y.o. male  PRE-OPERATIVE DIAGNOSIS:  INCISIONAL HERNIA  POST-OPERATIVE DIAGNOSIS:  INCISIONAL HERNIA  PROCEDURE:  Procedure(s): ROBOTIC INCISIONAL HERNIA REPAIR WITH MESH (N/A)-transverses abdominis release unilaterally INSERTION OF MESH (N/A)  SURGEON:  Surgeon(s) and Role:    * Ralene Ok, MD - Primary  ASSISTANTS: Pryor Curia, RNFA  ANESTHESIA:   local, regional, and general  EBL:  minimal   BLOOD ADMINISTERED:none  DRAINS: none   LOCAL MEDICATIONS USED:  BUPIVICAINE  and OTHER Exparel  SPECIMEN:  No Specimen  DISPOSITION OF SPECIMEN:  N/A  COUNTS:  YES  TOURNIQUET:  * No tourniquets in log *  DICTATION: .Dragon Dictation  Findings: Patient had incisional hernia and rectus diastases.  A piece of 21 x 20 cm piece of mesh, Prolene was placed and flatten the retrorectus space.  Patient underwent a unilateral right-sided TAR.  Details of the procedure: After the patient was consented he was taken back to the OR and placed in supine position with bilateral SCDs in place.  He underwent general endotracheal intubation.  Patient was then prepped and draped in sterile fashion.  A timeout was called and all facts verified.  An Optiview technique was used to enter the retrorectus space in the left subcostal margin.  Subsequent to this 5 mm trocar was advanced.  Insufflation was began.  At this time I bluntly dissect out the retrorectus space on the left retrorectus area.  At this time two 77mm trochar placed under direct visualization.  The 42mm trocar was now replaced with an 8 mm trocar.  At this time the robot was docked.  At this time the medial portion of the left retrorectus fascia was incised.  This was done in a falciform ligament.  The crossover was then initiated here.  Patient had a rectus diastases and at this time I entered the abdomen.  I continued incising the posterior rectus fascia on the left side.   There was some then omental adhesions to the hernia.  These were taken down sharply.  The hernia was easily visualized.  I took down the preperitoneal fat inferiorly and superiorly the hernia.  The right retrorectus fascia was incised.  I proceeded the dissection from inferior to superior direction.  This allowed me to open up the right retrorectus space.  I bluntly dissected laterally.  Secondary to his rectus diastases his midline had the appearance that it was good to be under tension.  At this time I mobilized the right transversus abdominis fascia and muscle.  I began by incising the transversus abdominis muscle at the costal margin.  I worked my way inferiorly.  This allowed me to bluntly dissect back the muscle flap laterally.  This allowed me to advance the fascia medially.  This did remove significant mount of tension.  At this time I felt comfortable with a unilateral T AR to help with medialization of the fascia.  At this time a 0 V-Loc 30cm was used to reapproximate the posterior rectus fascia x2.  I did this by running the stitches both from the superior and inferior direction.  The anterior fascia was reapproximated in the same fashion using a 0 V-Loc.  At this time the area was measured.  The area measured approximately 20 x 21 cm. At this time a piece of Ethicon Prolene mesh was cut into shape and placed to the preperitoneal space laparoscopically.  This lay flat.  Under direct visualization the insufflation  was evacuated.  At this time all trochars removed.  Skin was reapproximated all trocar sites using 4 Monocryl in subcuticular fashion.  The skin was dressed Dermabond.  Patient tolerated procedure well was taken to the recovery in stable condition.  PLAN OF CARE: Discharge to home after PACU  PATIENT DISPOSITION:  PACU - hemodynamically stable.   Delay start of Pharmacological VTE agent (>24hrs) due to surgical blood loss or risk of bleeding: not applicable

## 2020-08-20 NOTE — Transfer of Care (Signed)
Immediate Anesthesia Transfer of Care Note  Patient: Charles Wright  Procedure(s) Performed: ROBOTIC INCISIONAL HERNIA REPAIR WITH MESH (Abdomen) INSERTION OF MESH (Abdomen)  Patient Location: PACU  Anesthesia Type:General  Level of Consciousness: awake, alert  and oriented  Airway & Oxygen Therapy: Patient Spontanous Breathing and Patient connected to nasal cannula oxygen  Post-op Assessment: Report given to RN, Post -op Vital signs reviewed and stable and Patient moving all extremities  Post vital signs: Reviewed and stable  Last Vitals:  Vitals Value Taken Time  BP 137/95 08/20/20 1225  Temp 36.7 C 08/20/20 1225  Pulse 85 08/20/20 1228  Resp 19 08/20/20 1228  SpO2 94 % 08/20/20 1228  Vitals shown include unvalidated device data.  Last Pain:  Vitals:   08/20/20 1225  TempSrc:   PainSc: 10-Worst pain ever         Complications: No notable events documented.

## 2020-08-20 NOTE — H&P (Signed)
History of Present Illness The patient is a 85 year old male who presents with an incisional hernia. Referred by: Dr. Junious Silk Chief Complaint: Incisional hernia   Patient is a 85 year old male with a history of urologic stage IV cancer. Patient underwent robotic nephroureterectomy.   Physical patient states approximate 5 months after surgery he had a hernia.  Patient states that the hernia got larger.  Patient has had a history of Covid cough.  He has a chronic cough is currently being seen by pulmonary.   Patient states that he does have some discomfort and pain.  He is active does do some lifting.  He's had no signs or symptoms of incarceration or strangulation.       Past Surgical History Cataract Surgery   Bilateral. Colon Polyp Removal - Colonoscopy   Foot Surgery   Left. Laparoscopic Inguinal Hernia Surgery   Right. Nephrectomy   Left. Open Inguinal Hernia Surgery   Left. Spinal Surgery - Lower Back   Vasectomy     Diagnostic Studies History Colonoscopy   within last year   Allergies Allergies Reconciled   No Known Drug Allergies   [06/30/2020]:   Medication History Allopurinol  (300MG  Tablet, Oral) Active. Eliquis  (2.5MG  Tablet, Oral) Active. Famotidine  (20MG  Tablet, Oral) Active. Levothyroxine Sodium  (50MCG Tablet, Oral) Active. Metoprolol Tartrate  (25MG  Tablet, Oral) Active. Nitroglycerin  (0.4MG  Tab Sublingual, Sublingual) Active. Pravastatin Sodium  (40MG  Tablet, Oral) Active. Triamcinolone Acetonide  (0.1% Cream, External) Active.   Social History Alcohol use   Recently quit alcohol use. Caffeine use   Coffee, Tea. Tobacco use   Never smoker.   Family History Breast Cancer   Sister. Cancer   Mother. Heart Disease   Fa Bladder Problems   Cancer   Chronic Renal Failure Syndrome   Inguinal Hernia   Kidney Stone   Myocardial infarction   Prostate Cancer         Review of System General Present- Fatigue. Not Present- Appetite Loss, Chills,  Fever, Night Sweats, Weight Gain and Weight Loss. Skin Not Present- Change in Wart/Mole, Dryness, Hives, Jaundice, New Lesions, Non-Healing Wounds, Rash and Ulcer. HEENT Present- Hearing Loss and Wears glasses/contact lenses. Not Present- Earache, Hoarseness, Nose Bleed, Oral Ulcers, Ringing in the Ears, Seasonal Allergies, Sinus Pain, Sore Throat, Visual Disturbances and Yellow Eyes. Breast Not Present- Breast Mass, Breast Pain, Nipple Discharge and Skin Changes. Cardiovascular Present- Rapid Heart Rate. Not Present- Chest Pain, Difficulty Breathing Lying Down, Leg Cramps, Palpitations, Shortness of Breath and Swelling of Extremities. Gastrointestinal Present- Hemorrhoids. Not Present- Abdominal Pain, Bloating, Bloody Stool, Change in Bowel Habits, Chronic diarrhea, Constipation, Difficulty Swallowing, Excessive gas, Gets full quickly at meals, Indigestion, Nausea, Rectal Pain and Vomiting. Male Genitourinary Not Present- Blood in Urine, Change in Urinary Stream, Frequency, Impotence, Nocturia, Painful Urination, Urgency and Urine Leakage. Musculoskeletal Present- Joint Stiffness. Not Present- Back Pain, Joint Pain, Muscle Pain, Muscle Weakness and Swelling of Extremities. Neurological Not Present- Decreased Memory, Fainting, Headaches, Numbness, Seizures, Tingling, Tremor, Trouble walking and Weakness. Psychiatric Not Present- Anxiety, Bipolar, Change in Sleep Pattern, Depression, Fearful and Frequent crying. Endocrine Not Present- Cold Intolerance, Excessive Hunger, Hair Changes, Heat Intolerance, Hot flashes and New Diabetes. Hematology Not Present- Blood Thinners, Easy Bruising, Excessive bleeding, Gland problems, HIV and Persistent Infections. All other systems negative   Vitals BP 132/83   Pulse 69   Temp 97.8 F (36.6 C) (Oral)   Resp 18   Ht 5\' 7"  (1.702 m)   Wt 86.2  kg   SpO2 97%   BMI 29.76 kg/m         Physical Exam PE:  Constitutional: No acute distress, conversant,  appears states age. Eyes: Anicteric sclerae, moist conjunctiva, no lid lag Lungs: Clear to auscultation bilaterally, normal respiratory effort CV: regular rate and rhythm, no murmurs, no peripheral edema, pedal pulses 2+ GI: Soft, no masses or hepatosplenomegaly, non-tender to palpation Skin: No rashes, palpation reveals normal turgor Psychiatric: appropriate judgment and insight, oriented to person, place, and time     Abdomen Inspection Hernias - Incisional - Reducible  (Midline incisional hernia.) .       Assessment & Plan INCISIONAL HERNIA, WITHOUT OBSTRUCTION OR GANGRENE (K43.2) Impression: Patient is an 85 year old male, with an incisional hernia status post nephroureterectomy. Visualizing the CT scan which I personally. He has a small Incisional hernia. This appears to be to 3 cm in width.   Patient has a history of A. fib-on eliquis we'll obtain clearance to be off this prior surgery.   1. The patient will like to proceed to the operating room for robotic incisional hernia repair with mesh.   2. I discussed with the patient the signs and symptoms of incarceration and strangulation and the need to proceed to the ER should they occur.   3. I discussed with the patient the risks and benefits of the procedure to include but not limited to: Infection, bleeding, damage to surrounding structures, possible need for further surgery, possible nerve pain, and possible recurrence. The patient was understanding and wishes to proceed.

## 2020-08-20 NOTE — Anesthesia Postprocedure Evaluation (Signed)
Anesthesia Post Note  Patient: Charles Wright  Procedure(s) Performed: ROBOTIC INCISIONAL HERNIA REPAIR WITH MESH (Abdomen) INSERTION OF MESH (Abdomen)     Patient location during evaluation: PACU Anesthesia Type: General Level of consciousness: awake and alert Pain management: pain level controlled Vital Signs Assessment: post-procedure vital signs reviewed and stable Respiratory status: spontaneous breathing, nonlabored ventilation, respiratory function stable and patient connected to nasal cannula oxygen Cardiovascular status: blood pressure returned to baseline and stable Postop Assessment: no apparent nausea or vomiting Anesthetic complications: no   No notable events documented.  Last Vitals:  Vitals:   08/20/20 1240 08/20/20 1255  BP: 110/64 131/86  Pulse: 82 79  Resp: 18 18  Temp:    SpO2: 99% 94%    Last Pain:  Vitals:   08/20/20 1305  TempSrc:   PainSc: 7                  Catalina Gravel

## 2020-08-20 NOTE — Anesthesia Procedure Notes (Signed)
Procedure Name: Intubation Date/Time: 08/20/2020 9:55 AM Performed by: Merryl Hacker, RN Pre-anesthesia Checklist: Patient identified, Emergency Drugs available, Suction available and Patient being monitored Patient Re-evaluated:Patient Re-evaluated prior to induction Oxygen Delivery Method: Circle System Utilized Preoxygenation: Pre-oxygenation with 100% oxygen Induction Type: IV induction Ventilation: Mask ventilation without difficulty and Oral airway inserted - appropriate to patient size Laryngoscope Size: Mac and 4 Grade View: Grade I Tube type: Oral Tube size: 7.5 mm Number of attempts: 1 Airway Equipment and Method: Stylet and Oral airway Placement Confirmation: ETT inserted through vocal cords under direct vision, positive ETCO2 and breath sounds checked- equal and bilateral Secured at: 21 cm Tube secured with: Tape Dental Injury: Teeth and Oropharynx as per pre-operative assessment  Comments: Performed by Heide Scales, SRNA under direct supervision by Dr. Ola Spurr and Rhae Lerner, CRNA

## 2020-08-20 NOTE — Plan of Care (Signed)
  Problem: Education: Goal: Knowledge of General Education information will improve Description: Including pain rating scale, medication(s)/side effects and non-pharmacologic comfort measures Outcome: Progressing   Problem: Health Behavior/Discharge Planning: Goal: Ability to manage health-related needs will improve Outcome: Progressing   Problem: Clinical Measurements: Goal: Will remain free from infection Outcome: Progressing   

## 2020-08-21 ENCOUNTER — Encounter (HOSPITAL_COMMUNITY): Payer: Self-pay | Admitting: General Surgery

## 2020-08-21 ENCOUNTER — Telehealth: Payer: Self-pay | Admitting: Internal Medicine

## 2020-08-21 DIAGNOSIS — Z8551 Personal history of malignant neoplasm of bladder: Secondary | ICD-10-CM | POA: Diagnosis not present

## 2020-08-21 DIAGNOSIS — I4891 Unspecified atrial fibrillation: Secondary | ICD-10-CM | POA: Diagnosis not present

## 2020-08-21 DIAGNOSIS — K432 Incisional hernia without obstruction or gangrene: Secondary | ICD-10-CM | POA: Diagnosis not present

## 2020-08-21 DIAGNOSIS — Z8616 Personal history of COVID-19: Secondary | ICD-10-CM | POA: Diagnosis not present

## 2020-08-21 DIAGNOSIS — Z7901 Long term (current) use of anticoagulants: Secondary | ICD-10-CM | POA: Diagnosis not present

## 2020-08-21 LAB — CBC
HCT: 42.9 % (ref 39.0–52.0)
Hemoglobin: 13.5 g/dL (ref 13.0–17.0)
MCH: 25.4 pg — ABNORMAL LOW (ref 26.0–34.0)
MCHC: 31.5 g/dL (ref 30.0–36.0)
MCV: 80.8 fL (ref 80.0–100.0)
Platelets: 258 10*3/uL (ref 150–400)
RBC: 5.31 MIL/uL (ref 4.22–5.81)
RDW: 17.1 % — ABNORMAL HIGH (ref 11.5–15.5)
WBC: 12.8 10*3/uL — ABNORMAL HIGH (ref 4.0–10.5)
nRBC: 0 % (ref 0.0–0.2)

## 2020-08-21 NOTE — Progress Notes (Signed)
Questions about lovenox and when to restart eliquis. MD okay with restarting eliquis tomorrow. Called cardiologist office with return call saying that we needed to check with pharmacy. Spoke with [facility] pharmacist. If MD okay with starting Eliquis tomorrow that's fine, no need for lovenox bridge. No bleeding at surgical sites.

## 2020-08-21 NOTE — Telephone Encounter (Signed)
Spoke with nurse from [facility]. Patient was admitted overnight after surgery yesterday. She said patient is planning d/c home today, and they need advice on what to do with anticoagulation. Advised they consult inpatient pharmacy team but she said surgeon stated to call cardiology. Notified nurse Sharyn Lull I would send a message to our team on patient's behalf per request

## 2020-08-21 NOTE — Telephone Encounter (Signed)
Patient has been provided with instructions by inpatient pharmacy team, per Sharyn Lull (nurse)

## 2020-08-21 NOTE — Telephone Encounter (Deleted)
Routed to pharmacy team 

## 2020-08-21 NOTE — Telephone Encounter (Signed)
Does pt need to take lovanox along side of Eliquis? Please advise

## 2020-08-21 NOTE — Discharge Summary (Signed)
Physician Discharge Summary  Patient ID: Charles Wright MRN: [id number] DOB/AGE: 83-Jun-1937 83 y.o.  Admit date: 08/20/2020 Discharge date: 08/21/2020  Admission Diagnoses: s/p robotic hernia repair with mesh  Discharge Diagnoses:  Active Problems:   S/P hernia repair   Discharged Condition: good  Hospital Course: PT admitted post op 2.2 to sleep apnea.  Pt with low O2 sats in PACU.  Pt did well while admitted and used IS. Pt was weaned off O2 and tol with sat>90%  Pt had good pain control  Consults: None  Significant Diagnostic Studies: none  Treatments: surgery: as above  Discharge Exam: Blood pressure 123/77, pulse 76, temperature 97.8 F (36.6 C), temperature source Oral, resp. rate 16, height 5\' 7"  (1.702 m), weight 86.2 kg, SpO2 94 %. General appearance: alert and cooperative GI: soft, non-tender; bowel sounds normal; no masses,  no organomegaly  Disposition: Discharge disposition: 01-Home or Self Care       Discharge Instructions     Diet - low sodium heart healthy   Complete by: As directed    Diet - low sodium heart healthy   Complete by: As directed    Diet - low sodium heart healthy   Complete by: As directed    Increase activity slowly   Complete by: As directed    Increase activity slowly   Complete by: As directed    Increase activity slowly   Complete by: As directed       Allergies as of 08/21/2020       Reactions   Bee Venom Anaphylaxis   Actually Hornets and Wasps.    Losartan Cough      Oxycodone Other (See Comments)   ALTERED MENTAL STATUS GETS MEAN        Medication List     TAKE these medications    acetaminophen 500 MG tablet Commonly known as: TYLENOL Take 500 mg by mouth every 6 (six) hours as needed for moderate pain or headache.   allopurinol 300 MG tablet Commonly known as: ZYLOPRIM Take 300 mg by mouth every morning.   aspirin EC 81 MG tablet Take 81 mg by mouth daily.   Dialyvite Vitamin D 5000 125 MCG  (5000 UT) capsule Generic drug: Cholecalciferol Take 5,000 Units by mouth daily.   doxycycline 50 MG capsule Commonly known as: MONODOX Take 50 mg by mouth daily as needed (rosacea).   Eliquis 2.5 MG Tabs tablet Generic drug: apixaban TAKE ONE TABLET BY MOUTH TWICE A DAY   enoxaparin 120 MG/0.8ML injection Commonly known as: Lovenox Inject 0.8 mLs (120 mg total) into the skin daily.   GLUCOSAMINE SULFATE PO Take 750 mg by mouth daily.   levothyroxine 50 MCG tablet Commonly known as: SYNTHROID Take 50 mcg by mouth daily before breakfast.   metoprolol tartrate 25 MG tablet Commonly known as: LOPRESSOR Take 1 tablet (25 mg total) by mouth 2 (two) times daily.   MULTIVITAMIN PO Take 1 tablet by mouth daily.   nitroGLYCERIN 0.4 MG SL tablet Commonly known as: NITROSTAT Place 1 tablet (0.4 mg total) under the tongue every 5 (five) minutes x 3 doses as needed for chest pain.   pravastatin 40 MG tablet Commonly known as: PRAVACHOL Take 1 tablet (40 mg total) by mouth daily.   REFRESH RELIEVA OP Place 1 drop into both eyes daily as needed (dry eyes).   Testosterone 1.62 % Gel Apply 2 Pump topically every other day.   traMADol 50 MG tablet Commonly known as: Ultram Take  1 tablet (50 mg total) by mouth every 6 (six) hours as needed.       ASK your doctor about these medications    EPINEPHrine 0.3 mg/0.3 mL Soaj injection Commonly known as: EPI-PEN Inject 0.3 mLs (0.3 mg total) into the muscle once.        Follow-up Information     Ralene Ok, MD. Schedule an appointment as soon as possible for a visit in 3 week(s).   Specialty: General Surgery Why: Post op visit Contact information: [address] [place] [address] [phone]                 Signed: Ralene Ok 08/21/2020, 7:37 AM

## 2020-08-21 NOTE — Telephone Encounter (Signed)
Pt was advised per 08/12/20 PharmD bridging note to resume Eliquis when advised by Dr Rosendo Gros, ideally either 7/13 in PM or 7/14 AM.

## 2020-09-12 DIAGNOSIS — H903 Sensorineural hearing loss, bilateral: Secondary | ICD-10-CM | POA: Diagnosis not present

## 2020-10-14 ENCOUNTER — Ambulatory Visit (HOSPITAL_COMMUNITY)
Admission: RE | Admit: 2020-10-14 | Discharge: 2020-10-14 | Disposition: A | Discharge status: Home / Self Care | Payer: Medicare Other | Source: Ambulatory Visit | Attending: Urology | Admitting: Urology

## 2020-10-14 ENCOUNTER — Other Ambulatory Visit: Payer: Self-pay

## 2020-10-14 ENCOUNTER — Other Ambulatory Visit (HOSPITAL_COMMUNITY): Payer: Self-pay | Admitting: Urology

## 2020-10-14 DIAGNOSIS — J8410 Pulmonary fibrosis, unspecified: Secondary | ICD-10-CM | POA: Diagnosis not present

## 2020-10-14 DIAGNOSIS — Z8553 Personal history of malignant neoplasm of renal pelvis: Secondary | ICD-10-CM | POA: Diagnosis not present

## 2020-10-14 DIAGNOSIS — C674 Malignant neoplasm of posterior wall of bladder: Secondary | ICD-10-CM | POA: Diagnosis not present

## 2020-10-14 DIAGNOSIS — J984 Other disorders of lung: Secondary | ICD-10-CM | POA: Diagnosis not present

## 2020-10-14 DIAGNOSIS — Z8546 Personal history of malignant neoplasm of prostate: Secondary | ICD-10-CM | POA: Diagnosis not present

## 2020-10-14 DIAGNOSIS — Z85528 Personal history of other malignant neoplasm of kidney: Secondary | ICD-10-CM | POA: Diagnosis not present

## 2020-10-14 DIAGNOSIS — N200 Calculus of kidney: Secondary | ICD-10-CM | POA: Diagnosis not present

## 2020-10-14 DIAGNOSIS — R918 Other nonspecific abnormal finding of lung field: Secondary | ICD-10-CM | POA: Diagnosis not present

## 2020-10-22 DIAGNOSIS — Z8546 Personal history of malignant neoplasm of prostate: Secondary | ICD-10-CM | POA: Diagnosis not present

## 2020-10-22 DIAGNOSIS — Z8553 Personal history of malignant neoplasm of renal pelvis: Secondary | ICD-10-CM | POA: Diagnosis not present

## 2020-10-22 DIAGNOSIS — Z8551 Personal history of malignant neoplasm of bladder: Secondary | ICD-10-CM | POA: Diagnosis not present

## 2020-11-17 DIAGNOSIS — E7800 Pure hypercholesterolemia, unspecified: Secondary | ICD-10-CM | POA: Diagnosis not present

## 2020-11-17 DIAGNOSIS — M109 Gout, unspecified: Secondary | ICD-10-CM | POA: Diagnosis not present

## 2020-11-17 DIAGNOSIS — I129 Hypertensive chronic kidney disease with stage 1 through stage 4 chronic kidney disease, or unspecified chronic kidney disease: Secondary | ICD-10-CM | POA: Diagnosis not present

## 2020-11-17 DIAGNOSIS — E039 Hypothyroidism, unspecified: Secondary | ICD-10-CM | POA: Diagnosis not present

## 2020-11-30 IMAGING — US US RENAL
1 series · 14 of 25 positions shown · non-contrast
Comparison: 12/13/2018 ultrasound and 03/18/2019 CT

CLINICAL DATA: 84-year-old male with gross hematuria. History of
prostate and bladder cancer.

EXAM:
RENAL / URINARY TRACT ULTRASOUND COMPLETE

[Series 1: us renal · 14 of 41 slices shown]
[im 1/41]
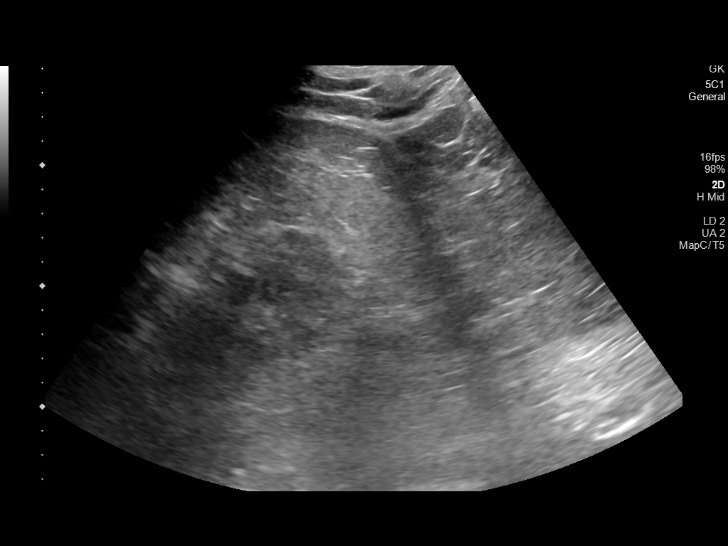
[im 4/41]
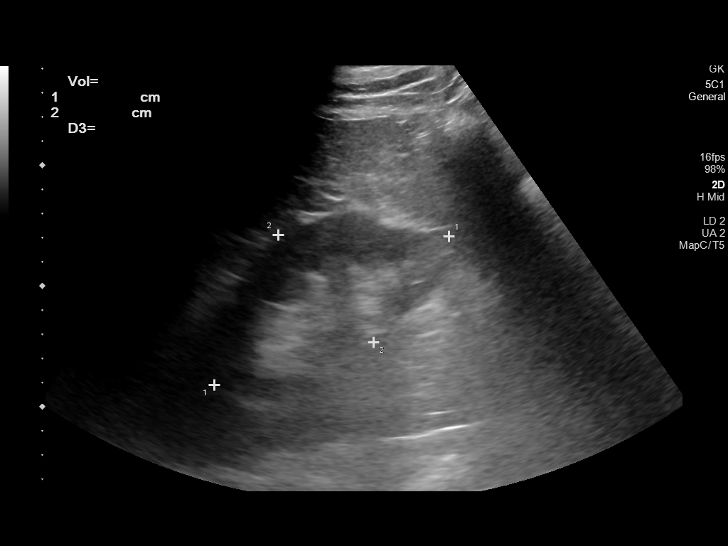
[im 7/41]
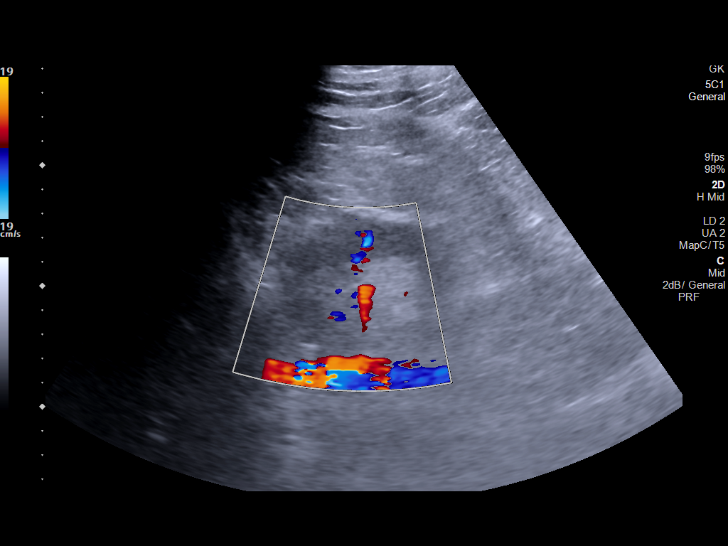
[im 11/41]
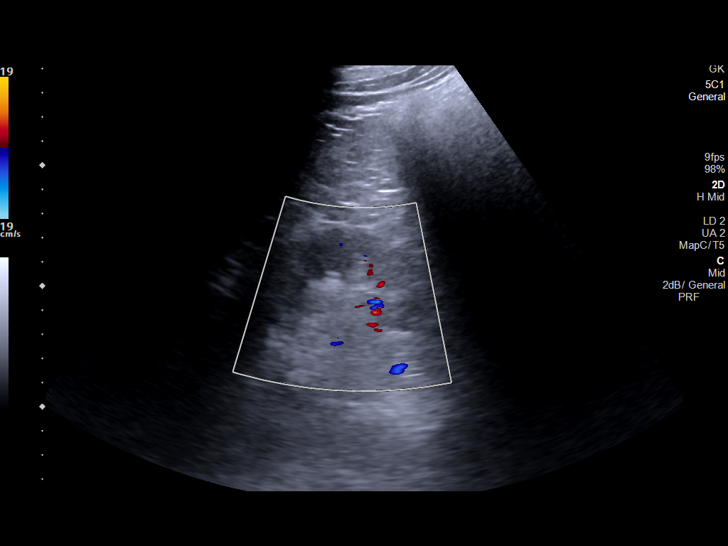
[im 14/41]
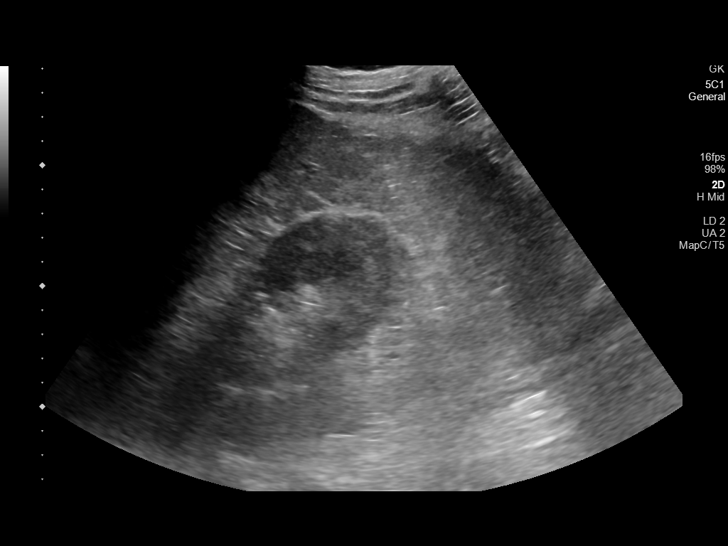
[im 16/41]
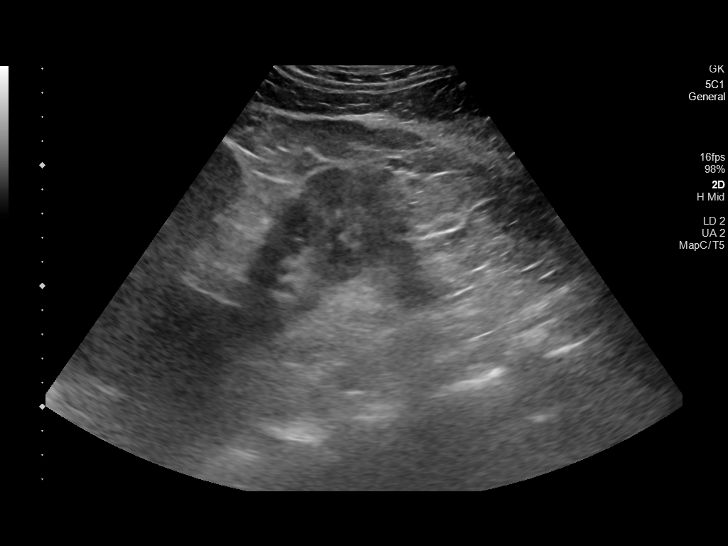
[im 19/41]
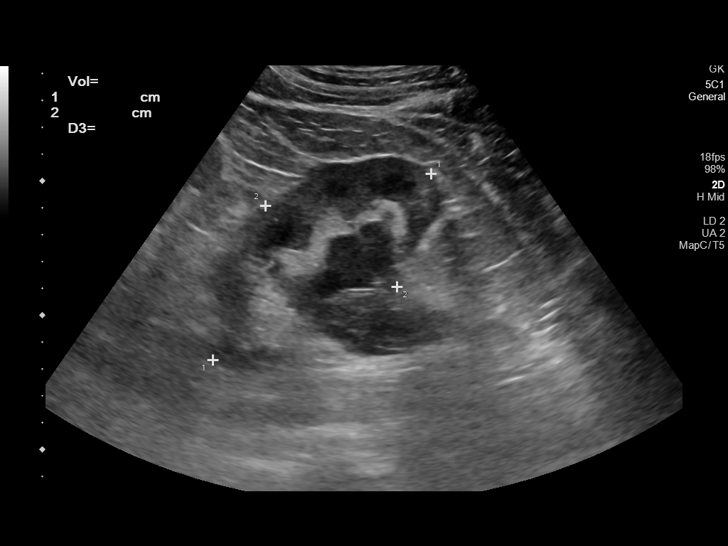
[im 22/41]
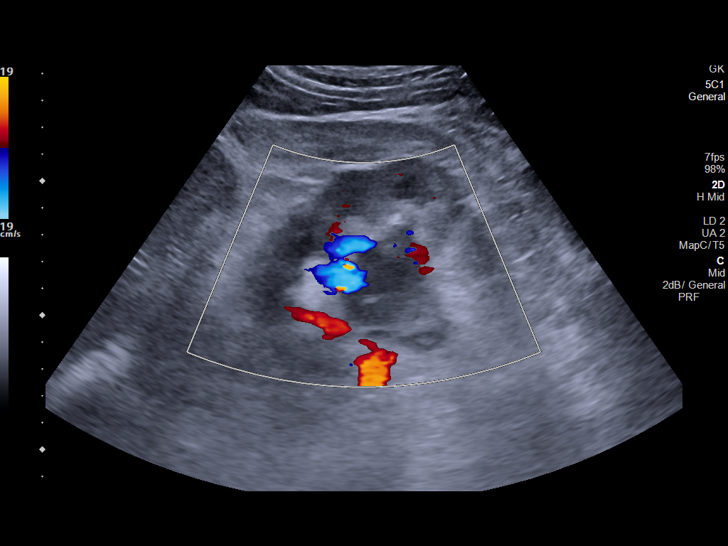
[im 26/41]
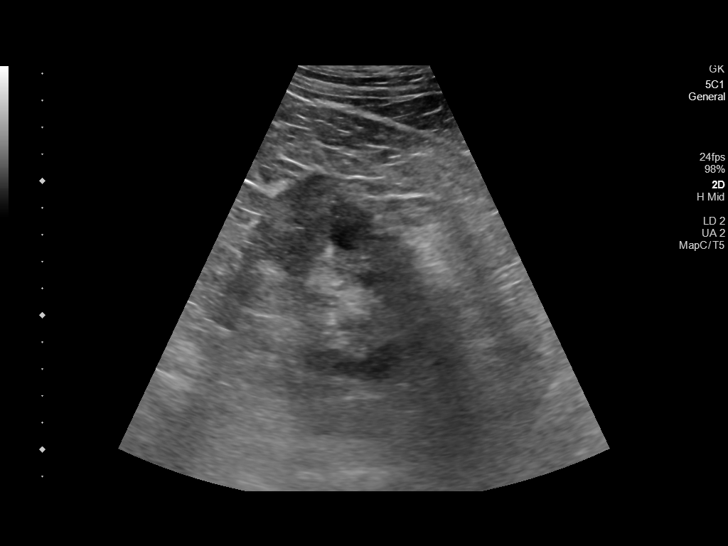
[im 27/41]
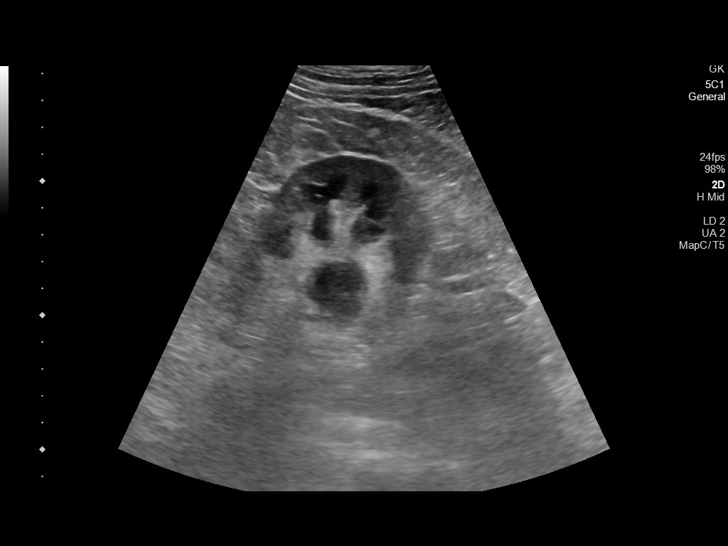
[im 31/41]
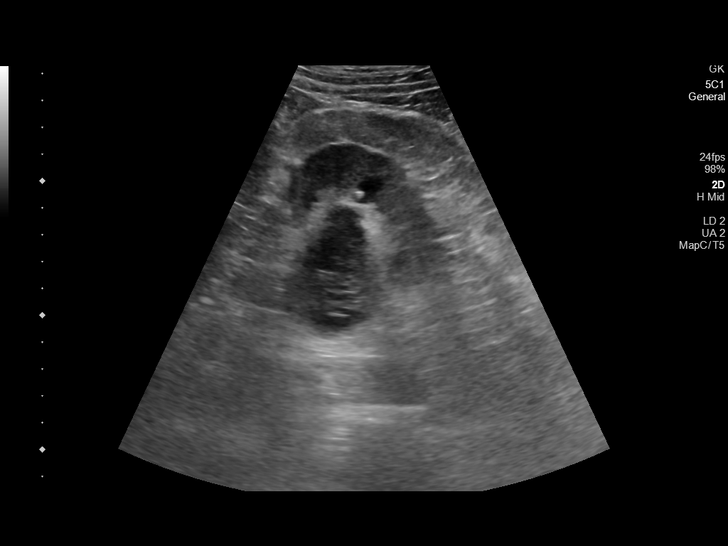
[im 34/41]
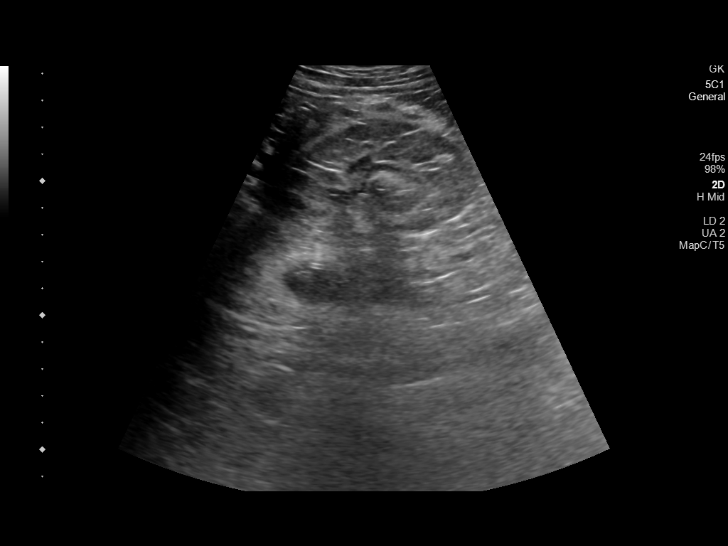
[im 37/41]
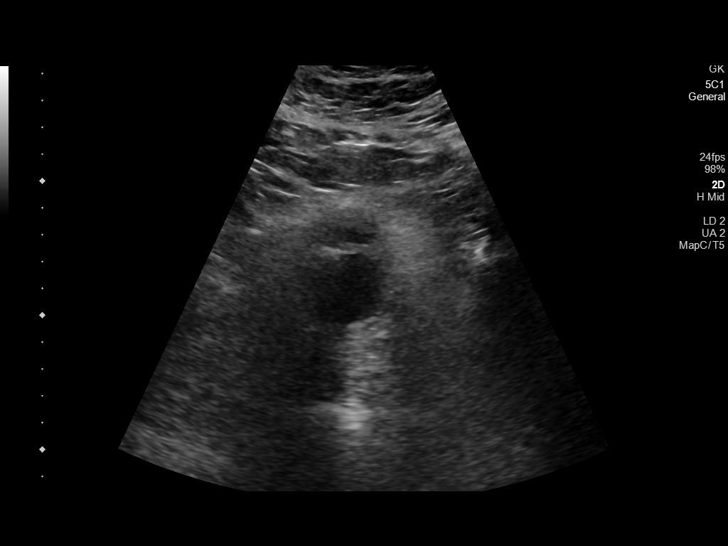
[im 41/41]
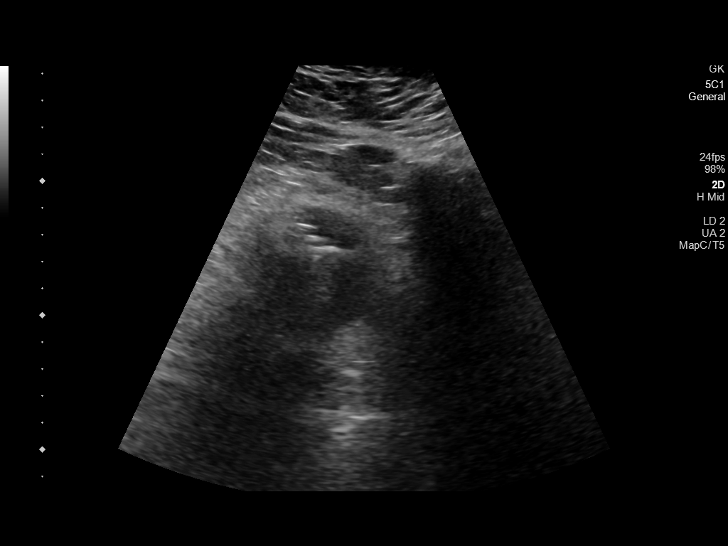

[14 of 25 positions shown; findings below may reference images not displayed]

FINDINGS: Right Kidney:

Renal measurements: 11.5 x 6 x 6.3 cm = volume: 224 mL .
Echogenicity within normal limits. No mass or hydronephrosis
visualized.

Left Kidney:

Renal measurements: 10.7 x 5.8 x 5.7 cm = volume: 184 mL. Moderate
LEFT hydronephrosis is identified with echogenic material in the
renal pelvis and collecting system. No discrete renal parenchymal
mass is identified.

Bladder:

A Foley catheter is noted within collapsed bladder.

Other:

None.
IMPRESSION: 1. Moderate LEFT hydronephrosis with echogenic material in the renal
pelvis/collecting system which may represent clot versus mass.
Degree of hydronephrosis appears similar to 03/18/2019 CT.
2. Unremarkable RIGHT kidney.
3. Foley catheter within a collapsed bladder.

## 2021-02-17 DIAGNOSIS — D225 Melanocytic nevi of trunk: Secondary | ICD-10-CM | POA: Diagnosis not present

## 2021-02-17 DIAGNOSIS — D2261 Melanocytic nevi of right upper limb, including shoulder: Secondary | ICD-10-CM | POA: Diagnosis not present

## 2021-02-17 DIAGNOSIS — D2262 Melanocytic nevi of left upper limb, including shoulder: Secondary | ICD-10-CM | POA: Diagnosis not present

## 2021-02-17 DIAGNOSIS — L814 Other melanin hyperpigmentation: Secondary | ICD-10-CM | POA: Diagnosis not present

## 2021-03-12 ENCOUNTER — Telehealth: Payer: Self-pay | Admitting: Internal Medicine

## 2021-03-12 MED ORDER — APIXABAN 2.5 MG PO TABS: 2.5000 mg | ORAL_TABLET | Freq: Two times a day (BID) | ORAL | 1 refills | Status: DC

## 2021-03-12 NOTE — Telephone Encounter (Signed)
Prescription refill request for Eliquis received. Indication:Afib Last office visit:3/22 Scr:1.5 Age: 86 Weight:86.2 kg  Prescription refilled

## 2021-03-12 NOTE — Telephone Encounter (Signed)
°*  STAT* If patient is at the pharmacy, call can be transferred to refill team.   1. Which medications need to be refilled? (please list name of each medication and dose if known) ELIQUIS 2.5 MG TABS tablet  2. Which pharmacy/location (including street and city if local pharmacy) is medication to be sent to? [facility] [id number], [address]  3. Do they need a 30 day or 90 day supply? 90   Patient is out of the medication

## 2021-04-13 DIAGNOSIS — Z8553 Personal history of malignant neoplasm of renal pelvis: Secondary | ICD-10-CM | POA: Diagnosis not present

## 2021-04-13 DIAGNOSIS — Z8546 Personal history of malignant neoplasm of prostate: Secondary | ICD-10-CM | POA: Diagnosis not present

## 2021-04-15 ENCOUNTER — Other Ambulatory Visit: Payer: Self-pay

## 2021-04-15 ENCOUNTER — Ambulatory Visit (HOSPITAL_COMMUNITY)
Admission: RE | Admit: 2021-04-15 | Discharge: 2021-04-15 | Disposition: A | Discharge status: Home / Self Care | Payer: Medicare Other | Source: Ambulatory Visit | Attending: Urology | Admitting: Urology

## 2021-04-15 ENCOUNTER — Other Ambulatory Visit (HOSPITAL_COMMUNITY): Payer: Self-pay | Admitting: Urology

## 2021-04-15 DIAGNOSIS — C679 Malignant neoplasm of bladder, unspecified: Secondary | ICD-10-CM | POA: Diagnosis not present

## 2021-04-15 DIAGNOSIS — I700 Atherosclerosis of aorta: Secondary | ICD-10-CM | POA: Diagnosis not present

## 2021-04-15 DIAGNOSIS — Z8553 Personal history of malignant neoplasm of renal pelvis: Secondary | ICD-10-CM

## 2021-04-15 DIAGNOSIS — Z8551 Personal history of malignant neoplasm of bladder: Secondary | ICD-10-CM | POA: Insufficient documentation

## 2021-04-15 DIAGNOSIS — K8689 Other specified diseases of pancreas: Secondary | ICD-10-CM | POA: Diagnosis not present

## 2021-04-20 DIAGNOSIS — Z8546 Personal history of malignant neoplasm of prostate: Secondary | ICD-10-CM | POA: Diagnosis not present

## 2021-04-20 DIAGNOSIS — E349 Endocrine disorder, unspecified: Secondary | ICD-10-CM | POA: Diagnosis not present

## 2021-04-20 DIAGNOSIS — Z8551 Personal history of malignant neoplasm of bladder: Secondary | ICD-10-CM | POA: Diagnosis not present

## 2021-04-20 DIAGNOSIS — Z8553 Personal history of malignant neoplasm of renal pelvis: Secondary | ICD-10-CM | POA: Diagnosis not present

## 2021-04-21 ENCOUNTER — Other Ambulatory Visit: Payer: Self-pay

## 2021-04-21 ENCOUNTER — Ambulatory Visit (HOSPITAL_BASED_OUTPATIENT_CLINIC_OR_DEPARTMENT_OTHER): Payer: Medicare Other | Admitting: Internal Medicine

## 2021-04-21 ENCOUNTER — Encounter (HOSPITAL_BASED_OUTPATIENT_CLINIC_OR_DEPARTMENT_OTHER): Payer: Self-pay | Admitting: Internal Medicine

## 2021-04-21 VITALS — BP 110/68 | HR 77 | Ht 67.0 in | Wt 199.0 lb

## 2021-04-21 DIAGNOSIS — R0602 Shortness of breath: Secondary | ICD-10-CM

## 2021-04-21 DIAGNOSIS — R5383 Other fatigue: Secondary | ICD-10-CM | POA: Diagnosis not present

## 2021-04-21 DIAGNOSIS — I493 Ventricular premature depolarization: Secondary | ICD-10-CM

## 2021-04-21 NOTE — Patient Instructions (Addendum)
Medication Instructions:  ?Your physician recommends that you continue on your current medications as directed. Please refer to the Current Medication list given to you today. ? ?*If you need a refill on your cardiac medications before your next appointment, please call your pharmacy* ? ?Testing/Procedures: ? ?Your physician has requested that you have an echocardiogram. Echocardiography is a painless test that uses sound waves to create images of your heart. It provides your doctor with information about the size and shape of your heart and how well your heart?s chambers and valves are working. This procedure takes approximately one hour. There are no restrictions for this procedure. ? ?ZIO XT- Long Term Monitor Instructions ? ?Your physician has requested you wear a ZIO patch monitor for 14 days.  ?This is a single patch monitor. [employer] supplies one patch monitor per enrollment. Additional ?stickers are not available. Please do not apply patch if you will be having a Nuclear Stress Test,  ?Echocardiogram, Cardiac CT, MRI, or Chest Xray during the period you would be wearing the  ?monitor. The patch cannot be worn during these tests. You cannot remove and re-apply the  ?ZIO XT patch monitor.  ?Your ZIO patch monitor will be mailed 3 day USPS to your address on file. It may take 3-5 days  ?to receive your monitor after you have been enrolled.  ?Once you have received your monitor, please review the enclosed instructions. Your monitor  ?has already been registered assigning a specific monitor serial # to you. ? ?Billing and Patient Assistance Program Information ? ?We have supplied [employer] with any of your insurance information on file for billing purposes. ?[employer] offers a sliding scale Patient Assistance Program for patients that do not have  ?insurance, or whose insurance does not completely cover the cost of the ZIO monitor.  ?You must apply for the Patient Assistance Program to qualify for this discounted  rate.  ?To apply, please call [employer] at [phone], select option 4, select option 2, ask to apply for  ?Patient Assistance Program. Theodore Demark will ask your household income, and how many people  ?are in your household. They will quote your out-of-pocket cost based on that information.  ?[employer] will also be able to set up a 21-month interest-free payment plan if needed. ? ?Applying the monitor ?  ?Shave hair from upper left chest.  ?Hold abrader disc by orange tab. Rub abrader in 40 strokes over the upper left chest as  ?indicated in your monitor instructions.  ?Clean area with 4 enclosed alcohol pads. Let dry.  ?Apply patch as indicated in monitor instructions. Patch will be placed under collarbone on left  ?side of chest with arrow pointing upward.  ?Rub patch adhesive wings for 2 minutes. Remove white label marked "1". Remove the white  ?label marked "2". Rub patch adhesive wings for 2 additional minutes.  ?While looking in a mirror, press and release button in center of patch. A small green light will  ?flash 3-4 times. This will be your only indicator that the monitor has been turned on.  ?Do not shower for the first 24 hours. You may shower after the first 24 hours.  ?Press the button if you feel a symptom. You will hear a small click. Record Date, Time and  ?Symptom in the Patient Logbook.  ?When you are ready to remove the patch, follow instructions on the last 2 pages of Patient  ?Logbook. Stick patch monitor onto the last page of Patient Logbook.  ?Place Patient Logbook in the  blue and white box. Use locking tab on box and tape box closed  ?securely. The blue and white box has prepaid postage on it. Please place it in the mailbox as  ?soon as possible. Your physician should have your test results approximately 7 days after the  ?monitor has been mailed back to [facility].  ?Call [facility] at [phone] if you have questions regarding  ?your ZIO XT patch monitor. Call them  immediately if you see an orange light blinking on your  ?monitor.  ?If your monitor falls off in less than 4 days, contact our Monitor department at [phone].  ?If your monitor becomes loose or falls off after 4 days call [employer] at [phone] for  ?suggestions on securing your monitor ? ? ? ?Follow-Up: ?At [facility], you and your health needs are our priority.  As part of our continuing mission to provide you with exceptional heart care, we have created designated Provider Care Teams.  These Care Teams include your primary Cardiologist (physician) and Advanced Practice Providers (APPs -  Physician Assistants and Nurse Practitioners) who all work together to provide you with the care you need, when you need it. ? ?We recommend signing up for the patient portal called "MyChart".  Sign up information is provided on this After Visit Summary.  MyChart is used to connect with patients for Virtual Visits (Telemedicine).  Patients are able to view lab/test results, encounter notes, upcoming appointments, etc.  Non-urgent messages can be sent to your provider as well.   ?To learn more about what you can do with MyChart, go to [email or web address].   ? ?Your next appointment:   ? ?Follow up with Dr. Debara Pickett in 1-2 months  ?** OK to schedule at [facility] or [facility] ?** OK to schedule for 30 minutes on a DOD day ?

## 2021-04-21 NOTE — Progress Notes (Signed)
? ?OFFICE NOTE ? ?Chief Complaint:  ?Dizziness, fatigue, palpitations ? ?Primary Care Physician: ?Maury Dus, MD ? ?HPI:  ?Charles Wright is a 86 y.o. male with a past medial history significant for coronary artery disease with non-STEMI in 2016.  He was found to have distal LAD disease not amenable to PCI as well as moderate mid to proximal LAD disease.  Medical therapy was recommended.  He also has a history of bladder cancer, prostate cancer, hyperlipidemia, hypertension and OSA on CPAP.  He has been followed by Dr. Wynonia Lawman.  He maintains on clopidogrel but not aspirin.  Recently he has been having issues with ureteral stricture and is recommended to have a cystoscopy with Dr. Junious Silk.  He would require clearance including holding clopidogrel prior to that procedure.  Today in the office he is coughing significantly.  He apparently had upper respiratory infection and then also had concomitant rash which was painful and vesicular and diagnosed as shingles by a dermatologist that he is friends with.  Besides his shortness of breath related upper respiratory infection as well as chest wall pain from shingles, he denies any chest pain concerning for angina or recent worsening shortness of breath. ? ?10/10/2018 ? ?Charles Wright returns today for follow-up of coronary disease and atrial fibrillation.  He is a former patient of Dr. Wynonia Lawman however Dr. Wynonia Lawman is now retired.  When we last spoke he received preoperative clearance for urologic procedure.  He does have a history of a bladder cancer and hematuria.  Interestingly, based on further chart review, he had had a prior watchman procedure for his A. fib attempt at stroke reduction.  At that time because of bleeding he was not felt to be a candidate for anticoagulation for his A. fib.  He had been on amiodarone.  He was then referred for watchman procedure which shows ultimately unsuccessful.  I was involved in the imaging component of that procedure.  Subsequently it was  recommended he go on aspirin and Plavix since he was not a candidate for anticoagulation due to GU bleeding from bladder cancer.  That has however been successfully treated and has had no further bleeding issues.  He also just had recent ureteral stenting which seems to have been successful.  When reviewing his chart today however it it notes that he is only on clopidogrel monotherapy.  Have an STEMI in 2016 and was found to have significant coronary disease however not amenable to intervention.  He is done well with that without any further chest pain however did have significant fatigue recently.  It turned out this was related to low testosterone and he is now on testosterone replacement.  Likely a side effect of antiandrogen therapy.  Recent labs show total cholesterol 158, HDL 50 LDL, 86 and triglycerides 115. ? ?05/02/2019 ? ?Charles Wright returns today for follow-up. He is complaining of some issues with lightheadedness. He recently underwent surgery for what was suspicious for a left upper tract urothelial carcinoma. This was complicated by pain and bladder spasms requiring multiple ER visits. He has had a decreased appetite and weight loss (5 pounds of which was his left kidney/ureter. Additionally, he is down a total of 20 pounds since I last saw him. This is pertinent because his blood pressure today was 90/60. In general it has run higher than this. I suspect it may be a reason why feels lightheaded. His EKG shows a sinus bradycardia at 55. Possible medications that could causes include his isosorbide and metoprolol  which she takes 50 mg twice daily. ? ?08/21/2019 ? ?Charles Wright seen today in follow-up.  Overall he continues to do well.  He says since he had his nephrectomies had no further bleeding issues.  He denies any chest pain or worsening shortness of breath.  Recently had some orthostatic hypotension with low blood pressure noted to be in the 70W systolic.  He was taken off of his Imdur and his  beta-blocker was decreased.  Blood pressure today was 116/72 and his mention he feels well. ? ?01/17/2020 ? ?Charles Wright is seen today for follow-up.  Over the summer he had chest pain and was seen by Almyra Deforest, PA-C.  Stress test was performed which showed small fixed apical defect without any ischemia, consistent with known distal LAD severe stenosis.  This vessel was thought to be too small to intervene on.  No other reversible ischemia was appreciated.  He noted that his chest pain was not improved with nitroglycerin.  During his exam he was also found to have bibasilar dry crackles consistent with possible interstitial lung disease.  High-resolution CT scan was performed which did corroborate this.  He has subsequently been referred to pulmonary and saw Dr. Vaughan Browner, who feels he may have IPF.  He has pending pulmonary function tests and follow-up with him in January.  He reports he still gets some intermittent chest discomfort in the left upper chest. ? ?04/18/2020 ? ?Charles Wright returns for follow-up.  He continues to have work-up of pulmonary fibrosis.  He remains on low-dose Eliquis 2.5 mg twice daily.  Heart rate is in the 60s today.  EKG shows sinus rhythm with frequent PVCs in a quadrigeminal pattern.  He says he is really unaware of any extrasystoles or palpitations.  Weight is down about 6 pounds since we last saw him. ? ?04/21/2021 ? ?Charles Wright returns today for follow-up.  He had called the office last year about concerns of palpitations, dizziness and fatigue.  This has been going on and off for the last year.  I was concerned that he might be having recurrent atrial fibrillation or perhaps PVCs.  We asked him to come to the office for an appointment but have not seen him until today.  EKG today does demonstrate frequent PVCs these in fact ventricular bigeminy.  If he has been in this for a good period of time he may have developed some cardiomyopathy. ? ?PMHx:  ?Past Medical History:  ?Diagnosis Date  ?  Acute gout of left ankle   ? 06-14-2015  ? Bladder cancer ([place])   ? BCG's tx's  ? BPH (benign prostatic hypertrophy)   ? CAD (coronary artery disease) CARDIOLOGIST-  DR TILLEY  ? a. NSTEMI 12/2014 -  99% dLAD-2 (not amenable to PCI), 50% dLAD-1, 60% mLAD. Medical therapy was recommended.  ? Cancer of kidney ([place])   ? left  ? Cough   ? HARSH NONPRODUCTIVE COUGH 1 AND 1/2 WEEKS  ? GERD (gastroesophageal reflux disease)   ? takes  OTC periodically  ? Gilbert's syndrome   ? H/O cardiac catheterization   ? a. Note: Difficult radial access in 12/2014 - recommend femoral approach if cath needed in the future.  ? History of kidney stones   ? History of non-ST elevation myocardial infarction (NSTEMI)   ? 12-12-2014   CARDIAC CATH W/ NO INTERVENTION X 2FEW DAYS APART  ? History of spinal fracture 2002  ? LUMBAR  ? Hyperlipidemia   ? Hypertension   ?  Hypothyroidism   ? Myocardial infarction [facility])   ? OSA on CPAP   ? SET ON 7 USES SOME NIGHTS  ? Paroxysmal A-fib ([place])   ? cardiologist-  dr Wynonia Lawman  ? Prostate cancer ([place]) 2019  ? last PSA  2.9  (montiored by urologist  dr Junious Silk) [place] FEB 2019 RAD DONE  ? Shingles   ? 2 weeks ago  ? Wears glasses   ? ? ?Past Surgical History:  ?Procedure Laterality Date  ? BACK SURGERY  2014  ? LOWER l 1, 2 4 AND 5  ? BILATERAL INGUINAL HERNIA REPAIR    ? CARDIAC CATHETERIZATION N/A 12/13/2014  ? Procedure: Left Heart Cath and Coronary Angiography;  Surgeon: Leonie Man, MD;  Location: [place] [facility];  Service: Cardiovascular;  Laterality: N/A;  culprit lesion diat LAD-2  99%, not approachable via PCTA  due to extremely tortuous up stream LAD/  mLAD 60% and dLAD-1 50%, both are at the extremely tortuous segment and not PCI targets/  otherwise normal coronary arteries  ? COLONOSCOPY WITH PROPOFOL N/A 01/16/2019  ? Procedure: COLONOSCOPY WITH PROPOFOL;  Surgeon: Clarene Essex, MD;  [facility];  Service: Endoscopy;  Laterality: N/A;  ? CYSTOSCOPY W/ RETROGRADES Left 08/22/2012   ? Procedure: CYSTOSCOPY WITH RETROGRADE PYELOGRAM;  left kidney washings;  Surgeon: Fredricka Bonine, MD;  [facility]: [facility];  Service: Urology;  Laterality: Left;  ? CYSTOSCOPY W/ RETR

## 2021-04-23 DIAGNOSIS — I493 Ventricular premature depolarization: Secondary | ICD-10-CM | POA: Diagnosis not present

## 2021-04-30 ENCOUNTER — Ambulatory Visit (INDEPENDENT_AMBULATORY_CARE_PROVIDER_SITE_OTHER): Payer: Medicare Other

## 2021-04-30 ENCOUNTER — Other Ambulatory Visit: Payer: Self-pay

## 2021-04-30 DIAGNOSIS — I493 Ventricular premature depolarization: Secondary | ICD-10-CM

## 2021-04-30 DIAGNOSIS — R42 Dizziness and giddiness: Secondary | ICD-10-CM | POA: Diagnosis not present

## 2021-05-01 LAB — ECHOCARDIOGRAM COMPLETE
AR max vel: 2.95 cm2
AV Area VTI: 2.9 cm2
AV Area mean vel: 3.12 cm2
AV Mean grad: 2 mmHg
AV Peak grad: 4.3 mmHg
Ao pk vel: 1.04 m/s
Area-P 1/2: 2.34 cm2
Calc EF: 63.2 %
S' Lateral: 2.43 cm
Single Plane A2C EF: 54 %
Single Plane A4C EF: 70.5 %

## 2021-05-04 ENCOUNTER — Ambulatory Visit (INDEPENDENT_AMBULATORY_CARE_PROVIDER_SITE_OTHER): Payer: Medicare Other

## 2021-05-04 ENCOUNTER — Other Ambulatory Visit: Payer: Self-pay

## 2021-05-04 ENCOUNTER — Ambulatory Visit: Payer: Medicare Other

## 2021-05-04 DIAGNOSIS — I493 Ventricular premature depolarization: Secondary | ICD-10-CM

## 2021-05-04 NOTE — Progress Notes (Unsigned)
[id number] mailed to patient 04/21/21. ?

## 2021-05-04 NOTE — Progress Notes (Unsigned)
Patient had been scheduled to have monitor applied at the [facility] 05/04/21 in error.   ?Patient had already been enrolled to have the monitor mailed to his home.  Patient received monitor and applied on 04/23/21.  He will remove the monitor on 05/07/21 and mail it back to [place].    ?I will contact [employer] to confirm events.  ?

## 2021-05-14 ENCOUNTER — Encounter (HOSPITAL_BASED_OUTPATIENT_CLINIC_OR_DEPARTMENT_OTHER): Payer: Self-pay | Admitting: Family

## 2021-05-14 ENCOUNTER — Ambulatory Visit (HOSPITAL_BASED_OUTPATIENT_CLINIC_OR_DEPARTMENT_OTHER): Payer: Medicare Other | Admitting: Family

## 2021-05-14 VITALS — BP 100/60 | HR 86 | Ht 67.0 in | Wt 196.4 lb

## 2021-05-14 DIAGNOSIS — I480 Paroxysmal atrial fibrillation: Secondary | ICD-10-CM | POA: Diagnosis not present

## 2021-05-14 DIAGNOSIS — J849 Interstitial pulmonary disease, unspecified: Secondary | ICD-10-CM

## 2021-05-14 DIAGNOSIS — E039 Hypothyroidism, unspecified: Secondary | ICD-10-CM | POA: Diagnosis not present

## 2021-05-14 DIAGNOSIS — I493 Ventricular premature depolarization: Secondary | ICD-10-CM

## 2021-05-14 DIAGNOSIS — I25118 Atherosclerotic heart disease of native coronary artery with other forms of angina pectoris: Secondary | ICD-10-CM

## 2021-05-14 DIAGNOSIS — E785 Hyperlipidemia, unspecified: Secondary | ICD-10-CM

## 2021-05-14 DIAGNOSIS — R002 Palpitations: Secondary | ICD-10-CM | POA: Diagnosis not present

## 2021-05-14 MED ORDER — METOPROLOL TARTRATE 25 MG PO TABS: 25.0000 mg | ORAL_TABLET | Freq: Two times a day (BID) | ORAL | 3 refills | Status: DC

## 2021-05-14 NOTE — Patient Instructions (Addendum)
Medication Instructions:  ?Your physician has recommended you make the following change in your medication:   ? ?Continue Metoprolol '25mg'$  twice daily ? ?Your may take an additional half tablet as needed for tachycardia or palpitations ? ?*If you need a refill on your cardiac medications before your next appointment, please call your pharmacy* ? ? ?Lab Work: ?Your physician recommends that you return for lab work today: CMP, magnesium, CBC, thyroid panel, direct LDL  ? ?If you have labs (blood work) drawn today and your tests are completely normal, you will receive your results only by: ?MyChart Message (if you have MyChart) OR ?A paper copy in the mail ?If you have any lab test that is abnormal or we need to change your treatment, we will call you to review the results. ? ? ?Testing/Procedures: ?Your echocardiogram showed normal heart pumping function and no heart valve abnormalities. This is a great result! ? ?We should have the results of your echocardiogram by early next week at the latest and will call you with results.  ? ?Follow-Up: ?At [facility], you and your health needs are our priority.  As part of our continuing mission to provide you with exceptional heart care, we have created designated Provider Care Teams.  These Care Teams include your primary Cardiologist (physician) and Advanced Practice Providers (APPs -  Physician Assistants and Nurse Practitioners) who all work together to provide you with the care you need, when you need it. ? ?We recommend signing up for the patient portal called "MyChart".  Sign up information is provided on this After Visit Summary.  MyChart is used to connect with patients for Virtual Visits (Telemedicine).  Patients are able to view lab/test results, encounter notes, upcoming appointments, etc.  Non-urgent messages can be sent to your provider as well.   ?To learn more about what you can do with MyChart, go to [email or web address].   ? ?Your next appointment:    ?Follow up pending monitor results with Dr. Debara Pickett or Advanced Practice Provider.  ? ? ?Other Instructions ? ?Heart Healthy Diet Recommendations: ?A low-salt diet is recommended. Meats should be grilled, baked, or boiled. Avoid fried foods. Focus on lean protein sources like fish or chicken with vegetables and fruits. The American Heart Association is a Microbiologist!  American Heart Association Diet and Lifeystyle Recommendations  ? ?Exercise recommendations: ?The American Heart Association recommends 150 minutes of moderate intensity exercise weekly. ?Try 30 minutes of moderate intensity exercise 4-5 times per week. ?This could include walking, jogging, or swimming. ? ?To prevent palpitations: ?Make sure you are adequately hydrated.  ?Avoid and/or limit caffeine containing beverages like soda or tea. ?Exercise regularly.  ?Manage stress well. ?Some over the counter medications can cause palpitations such as Benadryl, AdvilPM, TylenolPM. Regular Advil or Tylenol do not cause palpitations.   ?

## 2021-05-14 NOTE — Progress Notes (Signed)
? ?Office Visit  ?  ?Patient Name: Charles Wright ?Date of Encounter: 05/14/2021 ? ?PCP:  Maury Dus, MD ?  ?[place]  ?Cardiologist:  Pixie Casino, MD  ?Advanced Practice Provider:  No care team member to display ?Electrophysiologist:  None  ?   ? ?Chief Complaint  ?  ?Charles Wright is a 86 y.o. male with a hx of CAD s/p NSTEMI 2016 (LAD disease not amenable for PCI), bladder and prostate cancer, HLD, HTN, OSA on CPAP, ILD, hypothyroidism, obesity, TIA/CVA, atrial fibrillation presents today for follow up after echo and ZIO monitor.   ? ?Past Medical History  ?  ?Past Medical History:  ?Diagnosis Date  ? Acute gout of left ankle   ? 06-14-2015  ? Bladder cancer ([place])   ? BCG's tx's  ? BPH (benign prostatic hypertrophy)   ? CAD (coronary artery disease) CARDIOLOGIST-  DR TILLEY  ? a. NSTEMI 12/2014 -  99% dLAD-2 (not amenable to PCI), 50% dLAD-1, 60% mLAD. Medical therapy was recommended.  ? Cancer of kidney ([place])   ? left  ? Cough   ? HARSH NONPRODUCTIVE COUGH 1 AND 1/2 WEEKS  ? GERD (gastroesophageal reflux disease)   ? takes  OTC periodically  ? Gilbert's syndrome   ? H/O cardiac catheterization   ? a. Note: Difficult radial access in 12/2014 - recommend femoral approach if cath needed in the future.  ? History of kidney stones   ? History of non-ST elevation myocardial infarction (NSTEMI)   ? 12-12-2014   CARDIAC CATH W/ NO INTERVENTION X 2FEW DAYS APART  ? History of spinal fracture 2002  ? LUMBAR  ? Hyperlipidemia   ? Hypertension   ? Hypothyroidism   ? Myocardial infarction [facility])   ? OSA on CPAP   ? SET ON 7 USES SOME NIGHTS  ? Paroxysmal A-fib ([place])   ? cardiologist-  dr Wynonia Lawman  ? Prostate cancer ([place]) 2019  ? last PSA  2.9  (montiored by urologist  dr Junious Silk) [place] FEB 2019 RAD DONE  ? Shingles   ? 2 weeks ago  ? Wears glasses   ? ?Past Surgical History:  ?Procedure Laterality Date  ? BACK SURGERY  2014  ? LOWER l 1, 2 4 AND 5  ? BILATERAL INGUINAL HERNIA REPAIR    ? CARDIAC  CATHETERIZATION N/A 12/13/2014  ? Procedure: Left Heart Cath and Coronary Angiography;  Surgeon: Leonie Man, MD;  Location: [place] [facility];  Service: Cardiovascular;  Laterality: N/A;  culprit lesion diat LAD-2  99%, not approachable via PCTA  due to extremely tortuous up stream LAD/  mLAD 60% and dLAD-1 50%, both are at the extremely tortuous segment and not PCI targets/  otherwise normal coronary arteries  ? COLONOSCOPY WITH PROPOFOL N/A 01/16/2019  ? Procedure: COLONOSCOPY WITH PROPOFOL;  Surgeon: Clarene Essex, MD;  [facility];  Service: Endoscopy;  Laterality: N/A;  ? CYSTOSCOPY W/ RETROGRADES Left 08/22/2012  ? Procedure: CYSTOSCOPY WITH RETROGRADE PYELOGRAM;  left kidney washings;  Surgeon: Fredricka Bonine, MD;  Location: [facility];  Service: Urology;  Laterality: Left;  ? CYSTOSCOPY W/ RETROGRADES N/A 07/08/2015  ? Procedure: CYSTOSCOPY WITH RETROGRADE PYELOGRAM;  Surgeon: [name], MD;  Location: [facility];  Service: Urology;  Laterality: N/A;  ? CYSTOSCOPY W/ URETERAL STENT PLACEMENT Left 03/24/2019  ? Procedure: CYSTOSCOPY WITH RETROGRADE PYELOGRAM/URETERAL STENT PLACEMENT ureteroscopy biopsy of neoplasm;  Surgeon: [name], MD;  [facility];  Service: Urology;  Laterality: Left;  ? CYSTOSCOPY W/ URETERAL STENT PLACEMENT Left 04/08/2019  ? Procedure: CYSTOSCOPY cystogram clot evacuation left ureteroscopy clot evacuation left stent exchange liimited transuretheral resectio of prastate and fulguration  bleeders of prostate;  Surgeon: [name], MD;  [facility];  Service: Urology;  Laterality: Left;  ? CYSTOSCOPY WITH BIOPSY  08/22/2012  ? Procedure: CYSTOSCOPY WITH BIOPSY;  Surgeon: Fredricka Bonine, MD;  Location: [facility];  Service: Urology;;  ? CYSTOSCOPY WITH BIOPSY N/A 11/08/2014  ? Procedure: CYSTOSCOPY/BIOPSPY BLADDER INSTILLATION OF MARCAINE AND PYRIDIUM;  Surgeon: [name], MD;   Location[facility];  Service: Urology;  Laterality: N/A;  ? CYSTOSCOPY WITH BIOPSY N/A 07/08/2015  ? Procedure: CYSTOSCOPY WITH BLADDER BIOPSY, FULGURATION, RETROGRADE PYLOGRAM AND RENAL WASHING;  Surgeon: [name], MD;  Location: [facility];  Service: Urology;  Laterality: N/A;  ? CYSTOSCOPY WITH RETROGRADE PYELOGRAM, URETEROSCOPY AND STENT PLACEMENT Bilateral 07/30/2014  ? Procedure: CYSTOSCOPY WITH BLADDER BX FULERGATION AND BILATERAL RETROGRADE PYELOGRAM,;  Surgeon: [name], MD;  [facility];  Service: Urology;  Laterality: Bilateral;  ? CYSTOSCOPY WITH STENT PLACEMENT Left 03/24/2018  ? Procedure: STENT PLACEMENT AND BIOPSY;  Surgeon: [name], MD;  [facility];  Service: Urology;  Laterality: Left;  ? CYSTOSCOPY/RETROGRADE/URETEROSCOPY Bilateral 08/17/2013  ? Procedure: CYSTOSCOPY, BLADDER BIOPSY WITH BILATERAL RETROGRADE PYELOGRAM, LEFT URETEROSCOPY AND DILATION OF STRICTURE ;  Surgeon: [name], MD;  Location: [facility];  Service: Urology;  Laterality: Bilateral;  ? CYSTOSCOPY/RETROGRADE/URETEROSCOPY Left 03/24/2018  ? Procedure: CYSTOSCOPY/RETROGRADE/URETEROSCOPY;  Surgeon: [name], MD;  Location: [facility];  Service: Urology;  Laterality: Left;  ? HOT HEMOSTASIS N/A 01/16/2019  ? Procedure: HOT HEMOSTASIS (ARGON PLASMA COAGULATION/BICAP);  Surgeon: Clarene Essex, MD;  Location: [facility];  Service: Endoscopy;  Laterality: N/A;  ? INSERTION OF MESH N/A 08/20/2020  ? Procedure: INSERTION OF MESH;  Surgeon: Ralene Ok, MD;  Location: [place];  Service: General;  Laterality: N/A;  ? LEFT ATRIAL APPENDAGE OCCLUSION N/A 01/09/2015  ? Procedure: LEFT ATRIAL APPENDAGE OCCLUSION;  Surgeon: [name], MD;  Location: [place] [facility];  Service: Cardiovascular;  Laterality: N/A;  ? LUMBAR LAMINECTOMY/DECOMPRESSION MICRODISCECTOMY Left 12/03/2013  ? Procedure: Left Lumbar  three-four diskectomy with Lumbar four laminectomy ;  Surgeon: Newman Pies, MD;  Location: [facility];  Service: Neurosurgery;  Laterality: Left;  Left Lumbar three-four diskectomy with Lumbar four laminectomy   ? LYMPH NODE DISSECTION Bilateral 04/13/2019  ? Procedure: LYMPH NODE DISSECTION;  Surgeon: Alexis Frock, MD;  [facility];  Service: Urology;  Laterality: Bilateral;  ? ORIF LEFT ANKLE FX  2005  ? POLYPECTOMY  01/16/2019  ? Procedure: POLYPECTOMY;  Surgeon: Clarene Essex, MD;  [facility];  Service: Endoscopy;;  ? PROSTATE BIOPSY N/A 08/22/2012  ? Procedure: BIOPSY TRANSRECTAL ULTRASONIC PROSTATE (TUBP);  Surgeon: Fredricka Bonine, MD;  Location: [facility];  Service: Urology;  Laterality: N/A;  ? ROBOT ASSITED LAPAROSCOPIC NEPHROURETERECTOMY Left 04/13/2019  ? Procedure: XI ROBOT ASSITED LAPAROSCOPIC NEPHROURETERECTOMY;  Surgeon: Alexis Frock, MD;  [facility];  Service: Urology;  Laterality: Left;  3 HRS  ? TEE WITHOUT CARDIOVERSION N/A 12/31/2014  ? Procedure: TRANSESOPHAGEAL ECHOCARDIOGRAM (TEE);  Surgeon: Pixie Casino, MD;  [facility];  Service: Cardiovascular;  Laterality: N/A;  ef 55-60%/  mild MR, TR, and PR/  mild LAE and RAE  ? TRANSURETHRAL RESECTION OF  BLADDER TUMOR  10/22/2011  ? Procedure: TRANSURETHRAL RESECTION OF BLADDER TUMOR (TURBT);  Surgeon: Fredricka Bonine, MD;  Location: [facility];  Service: Urology;  Laterality: N/A;  TURBT, LEFT URETEROSCOPY, POSSIBLE URETERAL STENT ?  ? URETEROSCOPY  10/22/2011  ? Procedure: URETEROSCOPY;  Surgeon: Fredricka Bonine, MD;  Location: [facility];  Service: Urology;  Laterality: Left;  ? WISDOM TOOTH EXTRACTION    ? XI ROBOTIC ASSISTED VENTRAL HERNIA N/A 08/20/2020  ? Procedure: ROBOTIC INCISIONAL HERNIA REPAIR WITH MESH;  Surgeon: Ralene Ok, MD;  Location: [place];  Service: General;  Laterality: N/A;  ? ? ?Allergies ? ?Allergies  ?Allergen  Reactions  ? Bee Venom Anaphylaxis  ?  Actually Hornets and Wasps.   ? Losartan Cough  ?   ?  ? Oxycodone Other (See Comments)  ?  ALTERED MENTAL STATUS GETS MEAN  ? ? ?History of Present Illness  ?  ?Charles Wright

## 2021-05-15 NOTE — Progress Notes (Signed)
Yes .. follow-up on the monitor - could increase metop further - usually HR is the limiting factor, its not a very potent blood pressure med. ? ?Thanks for seeing him. ? ?-[place]ali ?

## 2021-05-19 ENCOUNTER — Telehealth (HOSPITAL_BASED_OUTPATIENT_CLINIC_OR_DEPARTMENT_OTHER): Payer: Self-pay

## 2021-05-19 MED ORDER — METOPROLOL TARTRATE 50 MG PO TABS: 50.0000 mg | ORAL_TABLET | Freq: Two times a day (BID) | ORAL | 3 refills | Status: DC

## 2021-05-19 NOTE — Telephone Encounter (Addendum)
Reviewed with patient and updated prescription sent to pharmacy  ? ? ?----- Message from Loel Dubonnet, NP sent at 05/18/2021  8:38 AM EDT ----- ?Reviewed with Dr. Debara Pickett. Monitor shows frequent episodes of SVT (fast heart beat in the top chamber of the heart). To prevent- recommend increasing Metoprolol Tartrate to '50mg'$  BID.  ?----- Message ----- ?From: Pixie Casino, MD ?Sent: 05/15/2021  11:33 AM EDT ?To: Loel Dubonnet, NP ? ? ? ? ?----- Message ----- ?From: Loel Dubonnet, NP ?Sent: 05/14/2021   8:20 PM EDT ?To: Pixie Casino, MD ? ?Monitor results not available until after my clinic visit with him. He takes Lopressor '25mg'$  BID and I instructed him in clinic to do additional 12.'5mg'$  PRN. Based on monitor- thought on increasing daily dose to '50mg'$  BID? Would just have to monitor for hypotension. Reported no hypotensive symptoms in clinic. ? ?

## 2021-05-22 ENCOUNTER — Telehealth (HOSPITAL_BASED_OUTPATIENT_CLINIC_OR_DEPARTMENT_OTHER): Payer: Self-pay

## 2021-05-22 DIAGNOSIS — E785 Hyperlipidemia, unspecified: Secondary | ICD-10-CM

## 2021-05-22 LAB — CBC
Hematocrit: 53.1 % — ABNORMAL HIGH (ref 37.5–51.0)
Hemoglobin: 17.6 g/dL (ref 13.0–17.7)
MCH: 29.1 pg (ref 26.6–33.0)
MCHC: 33.1 g/dL (ref 31.5–35.7)
MCV: 88 fL (ref 79–97)
Platelets: 274 10*3/uL (ref 150–450)
RBC: 6.05 x10E6/uL — ABNORMAL HIGH (ref 4.14–5.80)
RDW: 16.5 % — ABNORMAL HIGH (ref 11.6–15.4)
WBC: 11.7 10*3/uL — ABNORMAL HIGH (ref 3.4–10.8)

## 2021-05-22 LAB — COMPREHENSIVE METABOLIC PANEL
ALT: 21 IU/L (ref 0–44)
AST: 29 IU/L (ref 0–40)
Albumin/Globulin Ratio: 2 (ref 1.2–2.2)
Albumin: 4.3 g/dL (ref 3.6–4.6)
Alkaline Phosphatase: 76 IU/L (ref 44–121)
BUN/Creatinine Ratio: 18 (ref 10–24)
BUN: 28 mg/dL — ABNORMAL HIGH (ref 8–27)
Bilirubin Total: 1.6 mg/dL — ABNORMAL HIGH (ref 0.0–1.2)
CO2: 23 mmol/L (ref 20–29)
Calcium: 10.1 mg/dL (ref 8.6–10.2)
Chloride: 101 mmol/L (ref 96–106)
Creatinine, Ser: 1.59 mg/dL — ABNORMAL HIGH (ref 0.76–1.27)
Globulin, Total: 2.1 g/dL (ref 1.5–4.5)
Glucose: 104 mg/dL — ABNORMAL HIGH (ref 70–99)
Potassium: 5.3 mmol/L — ABNORMAL HIGH (ref 3.5–5.2)
Sodium: 140 mmol/L (ref 134–144)
Total Protein: 6.4 g/dL (ref 6.0–8.5)
eGFR: 43 mL/min/{1.73_m2} — ABNORMAL LOW (ref >59)

## 2021-05-22 LAB — THYROID PANEL WITH TSH
Free Thyroxine Index: 1.7 (ref 1.2–4.9)
T3 Uptake Ratio: 25 % (ref 24–39)
T4, Total: 6.9 ug/dL (ref 4.5–12.0)
TSH: 3.61 u[IU]/mL (ref 0.450–4.500)

## 2021-05-22 LAB — LDL CHOLESTEROL, DIRECT: LDL Direct: 152 mg/dL — ABNORMAL HIGH (ref 0–99)

## 2021-05-22 LAB — MAGNESIUM: Magnesium: 2.2 mg/dL (ref 1.6–2.3)

## 2021-05-22 MED ORDER — ROSUVASTATIN CALCIUM 20 MG PO TABS: 20.0000 mg | ORAL_TABLET | Freq: Every day | ORAL | 3 refills | Status: DC

## 2021-05-22 NOTE — Telephone Encounter (Addendum)
Called results to patient and left results on VM (ok per DPR), instructions left to call office back if patient has any questions!  ? ? ? ?----- Message from Loel Dubonnet, NP sent at 05/22/2021  9:12 AM EDT ----- ?CBC stable compared to previous. Stable kidney function. Normal electrolytes. Good result! Normal electrolytes, liver, thyroid. LDL 152 which is above goal of less than 70. Recommend stop Pravastatin and start Crestor '20mg'$  daily with LFT/FLP in 8 weeks.   ? ?[place] PCP as Juluis Rainier only ?

## 2021-05-25 ENCOUNTER — Ambulatory Visit: Payer: Medicare Other | Admitting: Internal Medicine

## 2021-05-29 ENCOUNTER — Ambulatory Visit: Payer: Medicare Other | Admitting: Internal Medicine

## 2021-06-02 DIAGNOSIS — Z0000 Encounter for general adult medical examination without abnormal findings: Secondary | ICD-10-CM | POA: Diagnosis not present

## 2021-06-02 DIAGNOSIS — E039 Hypothyroidism, unspecified: Secondary | ICD-10-CM | POA: Diagnosis not present

## 2021-06-02 DIAGNOSIS — E7800 Pure hypercholesterolemia, unspecified: Secondary | ICD-10-CM | POA: Diagnosis not present

## 2021-06-02 DIAGNOSIS — I129 Hypertensive chronic kidney disease with stage 1 through stage 4 chronic kidney disease, or unspecified chronic kidney disease: Secondary | ICD-10-CM | POA: Diagnosis not present

## 2021-06-10 DIAGNOSIS — U071 COVID-19: Secondary | ICD-10-CM | POA: Diagnosis not present

## 2021-06-30 DIAGNOSIS — H52203 Unspecified astigmatism, bilateral: Secondary | ICD-10-CM | POA: Diagnosis not present

## 2021-08-08 ENCOUNTER — Other Ambulatory Visit: Payer: Self-pay | Admitting: Internal Medicine

## 2021-08-08 DIAGNOSIS — I480 Paroxysmal atrial fibrillation: Secondary | ICD-10-CM

## 2021-08-10 NOTE — Telephone Encounter (Signed)
Eliquis 2.'5mg'$  refill request received. Patient is 86 years old, weight-89.1kg, Crea-1.59 on 05/14/2021, Diagnosis-Afib, and last seen by Laurann Montana on 05/14/2021. Dose is appropriate based on dosing criteria. Will send in refill to requested pharmacy.

## 2021-08-18 DIAGNOSIS — D225 Melanocytic nevi of trunk: Secondary | ICD-10-CM | POA: Diagnosis not present

## 2021-08-18 DIAGNOSIS — C44319 Basal cell carcinoma of skin of other parts of face: Secondary | ICD-10-CM | POA: Diagnosis not present

## 2021-08-18 DIAGNOSIS — L821 Other seborrheic keratosis: Secondary | ICD-10-CM | POA: Diagnosis not present

## 2021-08-18 DIAGNOSIS — D485 Neoplasm of uncertain behavior of skin: Secondary | ICD-10-CM | POA: Diagnosis not present

## 2021-08-18 DIAGNOSIS — L814 Other melanin hyperpigmentation: Secondary | ICD-10-CM | POA: Diagnosis not present

## 2021-08-21 DIAGNOSIS — M1990 Unspecified osteoarthritis, unspecified site: Secondary | ICD-10-CM | POA: Diagnosis not present

## 2021-08-26 ENCOUNTER — Other Ambulatory Visit: Payer: Self-pay | Admitting: Physician Assistant

## 2021-08-26 ENCOUNTER — Ambulatory Visit
Admission: RE | Admit: 2021-08-26 | Discharge: 2021-08-26 | Disposition: A | Discharge status: Home / Self Care | Payer: Medicare Other | Source: Ambulatory Visit | Attending: Physician Assistant | Admitting: Physician Assistant

## 2021-08-26 DIAGNOSIS — M19032 Primary osteoarthritis, left wrist: Secondary | ICD-10-CM | POA: Diagnosis not present

## 2021-08-26 DIAGNOSIS — M19049 Primary osteoarthritis, unspecified hand: Secondary | ICD-10-CM

## 2021-08-26 DIAGNOSIS — M19042 Primary osteoarthritis, left hand: Secondary | ICD-10-CM | POA: Diagnosis not present

## 2021-09-01 ENCOUNTER — Other Ambulatory Visit: Payer: Medicare Other

## 2021-09-01 DIAGNOSIS — E785 Hyperlipidemia, unspecified: Secondary | ICD-10-CM | POA: Diagnosis not present

## 2021-09-02 ENCOUNTER — Telehealth (HOSPITAL_BASED_OUTPATIENT_CLINIC_OR_DEPARTMENT_OTHER): Payer: Self-pay

## 2021-09-02 LAB — HEPATIC FUNCTION PANEL
ALT: 17 IU/L (ref 0–44)
AST: 27 IU/L (ref 0–40)
Albumin: 4.3 g/dL (ref 3.7–4.7)
Alkaline Phosphatase: 69 IU/L (ref 44–121)
Bilirubin Total: 1.7 mg/dL — ABNORMAL HIGH (ref 0.0–1.2)
Bilirubin, Direct: 0.4 mg/dL (ref 0.00–0.40)
Total Protein: 6.2 g/dL (ref 6.0–8.5)

## 2021-09-02 LAB — LIPID PANEL
Chol/HDL Ratio: 2.9 ratio (ref 0.0–5.0)
Cholesterol, Total: 111 mg/dL (ref 100–199)
HDL: 38 mg/dL — ABNORMAL LOW (ref >39)
LDL Chol Calc (NIH): 55 mg/dL (ref 0–99)
Triglycerides: 90 mg/dL (ref 0–149)
VLDL Cholesterol Cal: 18 mg/dL (ref 5–40)

## 2021-09-02 NOTE — Telephone Encounter (Addendum)
Called results to patient and left results on VM (ok per DPR), instructions left to call office back if patient has any questions!     ----- Message from Loel Dubonnet, NP sent at 09/02/2021  7:41 AM EDT ----- Cholesterol numbers at goal.  Normal liver enzymes.  Good result.  Continue Crestor 20 mg daily.

## 2021-09-22 DIAGNOSIS — D485 Neoplasm of uncertain behavior of skin: Secondary | ICD-10-CM | POA: Diagnosis not present

## 2021-10-13 ENCOUNTER — Other Ambulatory Visit (HOSPITAL_COMMUNITY): Payer: Self-pay | Admitting: Urology

## 2021-10-13 ENCOUNTER — Ambulatory Visit (HOSPITAL_COMMUNITY)
Admission: RE | Admit: 2021-10-13 | Discharge: 2021-10-13 | Disposition: A | Discharge status: Home / Self Care | Payer: Medicare Other | Source: Ambulatory Visit | Attending: Urology | Admitting: Urology

## 2021-10-13 DIAGNOSIS — E349 Endocrine disorder, unspecified: Secondary | ICD-10-CM | POA: Diagnosis not present

## 2021-10-13 DIAGNOSIS — R911 Solitary pulmonary nodule: Secondary | ICD-10-CM | POA: Diagnosis not present

## 2021-10-13 DIAGNOSIS — Z8551 Personal history of malignant neoplasm of bladder: Secondary | ICD-10-CM | POA: Diagnosis not present

## 2021-10-13 DIAGNOSIS — Z8546 Personal history of malignant neoplasm of prostate: Secondary | ICD-10-CM | POA: Diagnosis not present

## 2021-10-15 DIAGNOSIS — K760 Fatty (change of) liver, not elsewhere classified: Secondary | ICD-10-CM | POA: Diagnosis not present

## 2021-10-15 DIAGNOSIS — C679 Malignant neoplasm of bladder, unspecified: Secondary | ICD-10-CM | POA: Diagnosis not present

## 2021-10-15 DIAGNOSIS — C61 Malignant neoplasm of prostate: Secondary | ICD-10-CM | POA: Diagnosis not present

## 2021-10-15 DIAGNOSIS — C674 Malignant neoplasm of posterior wall of bladder: Secondary | ICD-10-CM | POA: Diagnosis not present

## 2021-10-15 DIAGNOSIS — C651 Malignant neoplasm of right renal pelvis: Secondary | ICD-10-CM | POA: Diagnosis not present

## 2021-10-19 DIAGNOSIS — Z8546 Personal history of malignant neoplasm of prostate: Secondary | ICD-10-CM | POA: Diagnosis not present

## 2021-10-19 DIAGNOSIS — Z8553 Personal history of malignant neoplasm of renal pelvis: Secondary | ICD-10-CM | POA: Diagnosis not present

## 2021-10-19 DIAGNOSIS — R351 Nocturia: Secondary | ICD-10-CM | POA: Diagnosis not present

## 2021-10-19 DIAGNOSIS — Z8551 Personal history of malignant neoplasm of bladder: Secondary | ICD-10-CM | POA: Diagnosis not present

## 2021-11-16 ENCOUNTER — Telehealth: Payer: Self-pay | Admitting: Internal Medicine

## 2021-11-16 DIAGNOSIS — H903 Sensorineural hearing loss, bilateral: Secondary | ICD-10-CM | POA: Diagnosis not present

## 2021-11-16 NOTE — Telephone Encounter (Signed)
Patient c/o Palpitations:  High priority if patient c/o lightheadedness, shortness of breath, or chest pain  How long have you had palpitations/irregular HR/ Afib? Are you having the symptoms now? yes  Are you currently experiencing lightheadedness, SOB or CP? Lightheadedness, sob  Do you have a history of afib (atrial fibrillation) or irregular heart rhythm? yes  Have you checked your BP or HR? (document readings if available): 110/70  Are you experiencing any other symptoms? No    Patient has appt on 11/28

## 2021-11-16 NOTE — Telephone Encounter (Signed)
Patient stated that for the past month, around 2x per week, he feels palpitations with sob and light headedness. While on the phone he said, "I fell pretty good, right now." BP 114/90, P 83. Recommended that when he has palpitations, sob, and lightheaded, to be taken to the ED. He said if that happens again, he would go to [facility].

## 2021-11-17 ENCOUNTER — Ambulatory Visit (HOSPITAL_BASED_OUTPATIENT_CLINIC_OR_DEPARTMENT_OTHER): Payer: Medicare Other | Admitting: Internal Medicine

## 2021-11-17 ENCOUNTER — Encounter (HOSPITAL_BASED_OUTPATIENT_CLINIC_OR_DEPARTMENT_OTHER): Payer: Self-pay | Admitting: Internal Medicine

## 2021-11-17 VITALS — BP 108/71 | HR 65 | Ht 67.0 in | Wt 192.8 lb

## 2021-11-17 DIAGNOSIS — I25110 Atherosclerotic heart disease of native coronary artery with unstable angina pectoris: Secondary | ICD-10-CM

## 2021-11-17 DIAGNOSIS — Z905 Acquired absence of kidney: Secondary | ICD-10-CM

## 2021-11-17 DIAGNOSIS — I480 Paroxysmal atrial fibrillation: Secondary | ICD-10-CM

## 2021-11-17 DIAGNOSIS — J849 Interstitial pulmonary disease, unspecified: Secondary | ICD-10-CM

## 2021-11-17 DIAGNOSIS — Z01812 Encounter for preprocedural laboratory examination: Secondary | ICD-10-CM

## 2021-11-17 DIAGNOSIS — N183 Chronic kidney disease, stage 3 unspecified: Secondary | ICD-10-CM | POA: Diagnosis not present

## 2021-11-17 DIAGNOSIS — R0602 Shortness of breath: Secondary | ICD-10-CM

## 2021-11-17 NOTE — H&P (View-Only) (Signed)
OFFICE NOTE  Chief Complaint:  Dizziness, fatigue, palpitations, progressive dyspnea  Primary Care Physician: Maury Dus, MD  HPI:  Charles Wright is a 86 y.o. male with a past medial history significant for coronary artery disease with non-STEMI in 2016.  He was found to have distal LAD disease not amenable to PCI as well as moderate mid to proximal LAD disease.  Medical therapy was recommended.  He also has a history of bladder cancer, prostate cancer, hyperlipidemia, hypertension and OSA on CPAP.  He has been followed by Dr. Wynonia Lawman.  He maintains on clopidogrel but not aspirin.  Recently he has been having issues with ureteral stricture and is recommended to have a cystoscopy with Dr. Junious Silk.  He would require clearance including holding clopidogrel prior to that procedure.  Today in the office he is coughing significantly.  He apparently had upper respiratory infection and then also had concomitant rash which was painful and vesicular and diagnosed as shingles by a dermatologist that he is friends with.  Besides his shortness of breath related upper respiratory infection as well as chest wall pain from shingles, he denies any chest pain concerning for angina or recent worsening shortness of breath.  10/10/2018  Charles Wright returns today for follow-up of coronary disease and atrial fibrillation.  He is a former patient of Dr. Wynonia Lawman however Dr. Wynonia Lawman is now retired.  When we last spoke he received preoperative clearance for urologic procedure.  He does have a history of a bladder cancer and hematuria.  Interestingly, based on further chart review, he had had a prior watchman procedure for his A. fib attempt at stroke reduction.  At that time because of bleeding he was not felt to be a candidate for anticoagulation for his A. fib.  He had been on amiodarone.  He was then referred for watchman procedure which shows ultimately unsuccessful.  I was involved in the imaging component of that procedure.   Subsequently it was recommended he go on aspirin and Plavix since he was not a candidate for anticoagulation due to GU bleeding from bladder cancer.  That has however been successfully treated and has had no further bleeding issues.  He also just had recent ureteral stenting which seems to have been successful.  When reviewing his chart today however it it notes that he is only on clopidogrel monotherapy.  Have an STEMI in 2016 and was found to have significant coronary disease however not amenable to intervention.  He is done well with that without any further chest pain however did have significant fatigue recently.  It turned out this was related to low testosterone and he is now on testosterone replacement.  Likely a side effect of antiandrogen therapy.  Recent labs show total cholesterol 158, HDL 50 LDL, 86 and triglycerides 115.  05/02/2019  Charles Wright returns today for follow-up. He is complaining of some issues with lightheadedness. He recently underwent surgery for what was suspicious for a left upper tract urothelial carcinoma. This was complicated by pain and bladder spasms requiring multiple ER visits. He has had a decreased appetite and weight loss (5 pounds of which was his left kidney/ureter. Additionally, he is down a total of 20 pounds since I last saw him. This is pertinent because his blood pressure today was 90/60. In general it has run higher than this. I suspect it may be a reason why feels lightheaded. His EKG shows a sinus bradycardia at 55. Possible medications that could causes include his isosorbide  and metoprolol which she takes 50 mg twice daily.  08/21/2019  Charles Wright seen today in follow-up.  Overall he continues to do well.  He says since he had his nephrectomies had no further bleeding issues.  He denies any chest pain or worsening shortness of breath.  Recently had some orthostatic hypotension with low blood pressure noted to be in the 22W systolic.  He was taken off of his  Imdur and his beta-blocker was decreased.  Blood pressure today was 116/72 and his mention he feels well.  01/17/2020  Charles Wright is seen today for follow-up.  Over the summer he had chest pain and was seen by Almyra Deforest, PA-C.  Stress test was performed which showed small fixed apical defect without any ischemia, consistent with known distal LAD severe stenosis.  This vessel was thought to be too small to intervene on.  No other reversible ischemia was appreciated.  He noted that his chest pain was not improved with nitroglycerin.  During his exam he was also found to have bibasilar dry crackles consistent with possible interstitial lung disease.  High-resolution CT scan was performed which did corroborate this.  He has subsequently been referred to pulmonary and saw Dr. Vaughan Browner, who feels he may have IPF.  He has pending pulmonary function tests and follow-up with him in January.  He reports he still gets some intermittent chest discomfort in the left upper chest.  04/18/2020  Charles Wright returns for follow-up.  He continues to have work-up of pulmonary fibrosis.  He remains on low-dose Eliquis 2.5 mg twice daily.  Heart rate is in the 60s today.  EKG shows sinus rhythm with frequent PVCs in a quadrigeminal pattern.  He says he is really unaware of any extrasystoles or palpitations.  Weight is down about 6 pounds since we last saw him.  04/21/2021  Charles Wright returns today for follow-up.  He had called the office last year about concerns of palpitations, dizziness and fatigue.  This has been going on and off for the last year.  I was concerned that he might be having recurrent atrial fibrillation or perhaps PVCs.  We asked him to come to the office for an appointment but have not seen him until today.  EKG today does demonstrate frequent PVCs these in fact ventricular bigeminy.  If he has been in this for a good period of time he may have developed some cardiomyopathy.  11/17/2021  Charles Wright returns today  for follow-up.  He continues to have episodes of dizziness, fatigue and progressive dyspnea on exertion.  He was seen in April by Laurann Montana, NP and noted to be having more palpitations.  She had proposed increasing his metoprolol.  He wore a monitor which showed some recurrent atrial fibrillation/flutter and SVT with a low burden of about 3%.  His metoprolol was then increased up to 50 mg twice daily.  This provided perhaps a little bit of benefit however subsequently he has become worse.  His energy level is poor.  He is quite fatigued and gets short of breath easily.  He is now waking up in the middle the night with palpitations.  He denies any chest pain.  Of note he had a cardiac catheterization back in 2016 which showed 99% stenosis of the distal LAD at the time he had NSTEMI as well as 50 to 60% stenosis of the mid LAD.  My concern is that he has had progression of this coronary disease.  PMHx:  Past  Medical History:  Diagnosis Date   Acute gout of left ankle    06-14-2015   Bladder cancer (HCC)    BCG's tx's   BPH (benign prostatic hypertrophy)    CAD (coronary artery disease) CARDIOLOGIST-  DR TILLEY   a. NSTEMI 12/2014 -  99% dLAD-2 (not amenable to PCI), 50% dLAD-1, 60% mLAD. Medical therapy was recommended.   Cancer of kidney (HCC)    left   Cough    HARSH NONPRODUCTIVE COUGH 1 AND 1/2 WEEKS   GERD (gastroesophageal reflux disease)    takes  OTC periodically   Gilbert's syndrome    H/O cardiac catheterization    a. Note: Difficult radial access in 12/2014 - recommend femoral approach if cath needed in the future.   History of kidney stones    History of non-ST elevation myocardial infarction (NSTEMI)    12-12-2014   CARDIAC CATH W/ NO INTERVENTION X 2FEW DAYS APART   History of spinal fracture 2002   LUMBAR   Hyperlipidemia    Hypertension    Hypothyroidism    Myocardial infarction (HCC)    OSA on CPAP    SET ON 7 USES SOME NIGHTS   Paroxysmal A-fib [facility])     cardiologist-  dr Wynonia Lawman   Prostate cancer [facility]) 2019   last PSA  2.9  (montiored by urologist  dr Junious Silk) [place] FEB 2019 RAD DONE   Shingles    2 weeks ago   Wears glasses     Past Surgical History:  Procedure Laterality Date   BACK SURGERY  2014   LOWER l 1, 2 4 AND 5   BILATERAL INGUINAL HERNIA REPAIR     CARDIAC CATHETERIZATION N/A 12/13/2014   Procedure: Left Heart Cath and Coronary Angiography;  Surgeon: Leonie Man, MD;  Location: [facility];  Service: Cardiovascular;  Laterality: N/A;  culprit lesion diat LAD-2  99%, not approachable via PCTA  due to extremely tortuous up stream LAD/  mLAD 60% and dLAD-1 50%, both are at the extremely tortuous segment and not PCI targets/  otherwise normal coronary arteries   COLONOSCOPY WITH PROPOFOL N/A 01/16/2019   Procedure: COLONOSCOPY WITH PROPOFOL;  Surgeon: Clarene Essex, MD;  Location: [facility];  Service: Endoscopy;  Laterality: N/A;   CYSTOSCOPY W/ RETROGRADES Left 08/22/2012   Procedure: CYSTOSCOPY WITH RETROGRADE PYELOGRAM;  left kidney washings;  Surgeon: Fredricka Bonine, MD;  Location: [facility];  Service: Urology;  Laterality: Left;   CYSTOSCOPY W/ RETROGRADES N/A 07/08/2015   Procedure: CYSTOSCOPY WITH RETROGRADE PYELOGRAM;  Surgeon: [name], MD;  Location: [facility];  Service: Urology;  Laterality: N/A;   CYSTOSCOPY W/ URETERAL STENT PLACEMENT Left 03/24/2019   Procedure: CYSTOSCOPY WITH RETROGRADE PYELOGRAM/URETERAL STENT PLACEMENT ureteroscopy biopsy of neoplasm;  Surgeon: [name], MD;  Location: [facility];  Service: Urology;  Laterality: Left;   CYSTOSCOPY W/ URETERAL STENT PLACEMENT Left 04/08/2019   Procedure: CYSTOSCOPY cystogram clot evacuation left ureteroscopy clot evacuation left stent exchange liimited transuretheral resectio of prastate and fulguration  bleeders of prostate;  Surgeon: [name], MD;  Location: [facility];  Service: Urology;   Laterality: Left;   CYSTOSCOPY WITH BIOPSY  08/22/2012   Procedure: CYSTOSCOPY WITH BIOPSY;  Surgeon: Fredricka Bonine, MD;  Location: [facility];  Service: Urology;;   CYSTOSCOPY WITH BIOPSY N/A 11/08/2014   Procedure: CYSTOSCOPY/BIOPSPY BLADDER INSTILLATION OF MARCAINE AND PYRIDIUM;  Surgeon: [name], MD;  Location[facility];  Service: Urology;  Laterality: N/A;   CYSTOSCOPY WITH BIOPSY N/A 07/08/2015   Procedure: CYSTOSCOPY WITH BLADDER BIOPSY, FULGURATION, RETROGRADE PYLOGRAM AND RENAL WASHING;  Surgeon: [name], MD;  Location: [facility];  Service: Urology;  Laterality: N/A;   CYSTOSCOPY WITH RETROGRADE PYELOGRAM, URETEROSCOPY AND STENT PLACEMENT Bilateral 07/30/2014   Procedure: CYSTOSCOPY WITH BLADDER BX FULERGATION AND BILATERAL RETROGRADE PYELOGRAM,;  Surgeon: [name], MD;  Location: [facility];  Service: Urology;  Laterality: Bilateral;   CYSTOSCOPY WITH STENT PLACEMENT Left 03/24/2018   Procedure: STENT PLACEMENT AND BIOPSY;  Surgeon: [name], MD;  Location: [facility];  Service: Urology;  Laterality: Left;   CYSTOSCOPY/RETROGRADE/URETEROSCOPY Bilateral 08/17/2013   Procedure: CYSTOSCOPY, BLADDER BIOPSY WITH BILATERAL RETROGRADE PYELOGRAM, LEFT URETEROSCOPY AND DILATION OF STRICTURE ;  Surgeon: [name], MD;  Location: [facility];  Service: Urology;  Laterality: Bilateral;   CYSTOSCOPY/RETROGRADE/URETEROSCOPY Left 03/24/2018   Procedure: CYSTOSCOPY/RETROGRADE/URETEROSCOPY;  Surgeon: [name], MD;  Location: [facility];  Service: Urology;  Laterality: Left;   HOT HEMOSTASIS N/A 01/16/2019   Procedure: HOT HEMOSTASIS (ARGON PLASMA COAGULATION/BICAP);  Surgeon: Clarene Essex, MD;  Location: [facility];  Service: Endoscopy;  Laterality: N/A;   INSERTION OF MESH N/A 08/20/2020   Procedure: INSERTION OF MESH;  Surgeon: Ralene Ok, MD;   Location: [place];  Service: General;  Laterality: N/A;   LEFT ATRIAL APPENDAGE OCCLUSION N/A 01/09/2015   Procedure: LEFT ATRIAL APPENDAGE OCCLUSION;  Surgeon: [name], MD;  Location: [facility];  Service: Cardiovascular;  Laterality: N/A;   LUMBAR LAMINECTOMY/DECOMPRESSION MICRODISCECTOMY Left 12/03/2013   Procedure: Left Lumbar three-four diskectomy with Lumbar four laminectomy ;  Surgeon: Newman Pies, MD;  Location: [facility];  Service: Neurosurgery;  Laterality: Left;  Left Lumbar three-four diskectomy with Lumbar four laminectomy    LYMPH NODE DISSECTION Bilateral 04/13/2019   Procedure: LYMPH NODE DISSECTION;  Surgeon: Alexis Frock, MD;  Location: [facility];  Service: Urology;  Laterality: Bilateral;   ORIF LEFT ANKLE FX  2005   POLYPECTOMY  01/16/2019   Procedure: POLYPECTOMY;  Surgeon: Clarene Essex, MD;  Location: [facility];  Service: Endoscopy;;   PROSTATE BIOPSY N/A 08/22/2012   Procedure: BIOPSY TRANSRECTAL ULTRASONIC PROSTATE (TUBP);  Surgeon: Fredricka Bonine, MD;  Location: [facility];  Service: Urology;  Laterality: N/A;   ROBOT ASSITED LAPAROSCOPIC NEPHROURETERECTOMY Left 04/13/2019   Procedure: XI ROBOT ASSITED LAPAROSCOPIC NEPHROURETERECTOMY;  Surgeon: Alexis Frock, MD;  Location: [facility];  Service: Urology;  Laterality: Left;  3 HRS   TEE WITHOUT CARDIOVERSION N/A 12/31/2014   Procedure: TRANSESOPHAGEAL ECHOCARDIOGRAM (TEE);  Surgeon: Pixie Casino, MD;  Location: [facility];  Service: Cardiovascular;  Laterality: N/A;  ef 55-60%/  mild MR, TR, and PR/  mild LAE and RAE   TRANSURETHRAL RESECTION OF BLADDER TUMOR  10/22/2011   Procedure: TRANSURETHRAL RESECTION OF BLADDER TUMOR (TURBT);  Surgeon: Fredricka Bonine, MD;  Location: [facility];  Service: Urology;  Laterality: N/A;  TURBT, LEFT URETEROSCOPY, POSSIBLE URETERAL STENT    URETEROSCOPY  10/22/2011   Procedure: URETEROSCOPY;  Surgeon: Fredricka Bonine,  MD;  Location: [facility];  Service: Urology;  Laterality: Left;   WISDOM TOOTH EXTRACTION     XI ROBOTIC ASSISTED VENTRAL HERNIA N/A 08/20/2020   Procedure: ROBOTIC INCISIONAL HERNIA REPAIR WITH MESH;  Surgeon: Ralene Ok, MD;  Location: [place];  Service: General;  Laterality: N/A;    FAMHx:  Family History  Problem  Relation Age of Onset   Leukemia Mother    Heart attack Father    Cancer Sister        breast   Hypertension Neg Hx    Stroke Neg Hx    Colon cancer Neg Hx    Esophageal cancer Neg Hx    Rectal cancer Neg Hx    Stomach cancer Neg Hx     SOCHx:   reports that he has never smoked. He has never used smokeless tobacco. He reports current alcohol use of about 7.0 standard drinks of alcohol per week. He reports that he does not use drugs.  ALLERGIES:  Allergies  Allergen Reactions   Bee Venom Anaphylaxis    Actually Hornets and Wasps.    Losartan Cough        Oxycodone Other (See Comments)    ALTERED MENTAL STATUS GETS MEAN   Prednisone     Other reaction(s): personality change    ROS: Pertinent items noted in HPI and remainder of comprehensive ROS otherwise negative.  HOME MEDS: Current Outpatient Medications on File Prior to Visit  Medication Sig Dispense Refill   acetaminophen (TYLENOL) 500 MG tablet Take 500 mg by mouth every 6 (six) hours as needed for moderate pain or headache.     allopurinol (ZYLOPRIM) 300 MG tablet Take 300 mg by mouth every morning.      apixaban (ELIQUIS) 2.5 MG TABS tablet TAKE ONE TABLET BY MOUTH TWICE A DAY 180 tablet 1   aspirin EC 81 MG tablet Take 81 mg by mouth daily.     Carboxymethylcellul-Glycerin (REFRESH RELIEVA OP) Place 1 drop into both eyes daily as needed (dry eyes).     Cholecalciferol (DIALYVITE VITAMIN D 5000) 125 MCG (5000 UT) capsule Take 5,000 Units by mouth daily.     doxycycline (MONODOX) 50 MG capsule Take 50 mg by mouth daily as needed (rosacea).     EPINEPHrine 0.3 mg/0.3 mL IJ SOAJ  injection Inject 0.3 mLs (0.3 mg total) into the muscle once. (Patient taking differently: Inject 0.3 mg into the muscle as needed for anaphylaxis.) 2 Device 1   GLUCOSAMINE SULFATE PO Take 750 mg by mouth daily.     levothyroxine (SYNTHROID, LEVOTHROID) 50 MCG tablet Take 50 mcg by mouth daily before breakfast.     metoprolol tartrate (LOPRESSOR) 50 MG tablet Take 1 tablet (50 mg total) by mouth 2 (two) times daily. Take additional half tablet as needed for for palpitations. 180 tablet 3   Multiple Vitamin (MULTIVITAMIN PO) Take 1 tablet by mouth daily.     nitroGLYCERIN (NITROSTAT) 0.4 MG SL tablet Place 1 tablet (0.4 mg total) under the tongue every 5 (five) minutes x 3 doses as needed for chest pain. 25 tablet 12   rosuvastatin (CRESTOR) 20 MG tablet Take 1 tablet (20 mg total) by mouth daily. 90 tablet 3   Testosterone 1.62 % GEL Apply 2 Pump topically every other day.      No current facility-administered medications on file prior to visit.    LABS/IMAGING: No results found for this or any previous visit (from the past 48 hour(s)). No results found.  LIPID PANEL:    Component Value Date/Time   CHOL 111 09/01/2021 0947   TRIG 90 09/01/2021 0947   HDL 38 (L) 09/01/2021 0947   CHOLHDL 2.9 09/01/2021 0947   CHOLHDL 4.4 12/12/2014 1721   VLDL 18 12/12/2014 1721   LDLCALC 55 09/01/2021 0947   LDLDIRECT 152 (H) 05/14/2021 1428  WEIGHTS: Wt Readings from Last 3 Encounters:  11/17/21 192 lb 12.8 oz (87.5 kg)  05/14/21 196 lb 6.4 oz (89.1 kg)  04/21/21 199 lb (90.3 kg)    VITALS: BP 108/71   Pulse 65   Ht '5\' 7"'$  (1.702 m)   Wt 192 lb 12.8 oz (87.5 kg)   SpO2 97%   BMI 30.20 kg/m   EXAM: General appearance: alert and no distress Neck: no carotid bruit, no JVD and thyroid not enlarged, symmetric, no tenderness/mass/nodules Lungs: rales bibasilar Heart: regular rate and rhythm Abdomen: soft, non-tender; bowel sounds normal; no masses,  no organomegaly and  obese Extremities: extremities normal, atraumatic, no cyanosis or edema Pulses: 2+ and symmetric Skin: Skin color, texture, turgor normal. No rashes or lesions Neurologic: Grossly normal : Pleasant  EKG: Normal sinus rhythm at 67-personally reviewed  ASSESSMENT: Dizziness, fatigue, progressive dyspnea History of NSTEMI - found to have multivessel coronary artery disease, including 99% LAD distal stenosis (not amenable to PCI) and 50 to 60% mid LAD stenosis of a tortuous vessel (12/2014), without angina History of bladder CA with hematuria, resolved Failed Watchman LAA procedure in 2016 History of hypertensive heart disease PAF - CHADVASC score of 5 Obesity Hyperlipidemia OSA on CPAP History of TIA/stroke Hypothyroidism Possible left upper tract urothelial carcinoma-status post resection and radical left nephro ureterectomy. Interstitial lung disease -multiple COVID-19 infections Frequent PVCs -ventricular bigeminy  PLAN: 1.   Charles Wright has had worsening dizziness, fatigue and progressive dyspnea including nocturnal awakenings with palpitations.  His monitor showed a low burden of A-fib/flutter which could be partially responsible for this.  He had minimal improvement with an increase in his metoprolol.  Heart rate and blood pressure are in the normal range and EKG today shows a normal sinus rhythm.  He does have a history of at least moderate to severe coronary disease with prior NSTEMI and was found to have severe distal LAD disease not amenable to PCI with moderate mid-LAD stenosis.  He could certainly have had progression of his coronary disease since that time.  I am recommending a repeat heart catheterization.  He does have a history of urothelial carcinoma and is status post resection of the left kidney.  Creatinine is 1.5 and he will need prehydration and judicious use of contrast for the procedure.  He is also on Eliquis and would have to hold this for least 2 days prior to the  procedure.  We will try to schedule it this week on Friday.  If there is no significant obstructive coronary disease, then would likely refer back to EP for recommendations of management of recurrent atrial arrhythmias.  Shared Decision Making/Informed Consent The risks [stroke (1 in 1000), death (1 in 1000), kidney failure [usually temporary] (1 in 500), bleeding (1 in 200), allergic reaction [possibly serious] (1 in 200)], benefits (diagnostic support and management of coronary artery disease) and alternatives of a cardiac catheterization were discussed in detail with Charles Wright and he is willing to proceed.   Pixie Casino, MD, [facility] [place] Director of the [facility] Lipid [facility] &  Cardiovascular Risk Reduction Clinic Diplomate of the American Board of Clinical Lipidology Attending Cardiologist  Direct Dial: [phone]  Fax: [phone]  Website:  [email or web address]   Charles Wright 11/17/2021, 2:29 PM

## 2021-11-17 NOTE — Patient Instructions (Signed)
Medication Instructions:  Your Physician recommend you continue on your current medication as directed.    *If you need a refill on your cardiac medications before your next appointment, please call your pharmacy*   Lab Work: Your provider has recommended lab work today (CBC, BMP). Please have this collected at [facility] [place]. The lab is open 8:00 am - 4:30 pm. Please avoid 12:00p - 1:00p for lunch hour. You do not need an appointment. Please go to [address] [place], [place] [address]. This is in the Primary Care office on the 3rd floor, let them know you are there for blood work and they will direct you to the lab.  If you have labs (blood work) drawn today and your tests are completely normal, you will receive your results only by: [place] (if you have MyChart) OR A paper copy in the mail If you have any lab test that is abnormal or we need to change your treatment, we will call you to review the results.   Testing/Procedures: Your physician has requested that you have a cardiac catheterization. Cardiac catheterization is used to diagnose and/or treat various heart conditions. Doctors may recommend this procedure for a number of different reasons. The most common reason is to evaluate chest pain. Chest pain can be a symptom of coronary artery disease (CAD), and cardiac catheterization can show whether plaque is narrowing or blocking your heart's arteries. This procedure is also used to evaluate the valves, as well as measure the blood flow and oxygen levels in different parts of your heart. For further information please visit [email or web address]. Please follow instruction sheet, as given. [facility]   Follow-Up: At [facility], you and your health needs are our priority.  As part of our continuing mission to provide you with exceptional heart care, we have created designated Provider Care Teams.  These Care Teams include your  primary Cardiologist (physician) and Advanced Practice Providers (APPs -  Physician Assistants and Nurse Practitioners) who all work together to provide you with the care you need, when you need it.  We recommend signing up for the patient portal called "MyChart".  Sign up information is provided on this After Visit Summary.  MyChart is used to connect with patients for Virtual Visits (Telemedicine).  Patients are able to view lab/test results, encounter notes, upcoming appointments, etc.  Non-urgent messages can be sent to your provider as well.   To learn more about what you can do with MyChart, go to [email or web address].    Your next appointment:   2 month(s)  The format for your next appointment:   In Person  Provider:   Pixie Casino, MD        Cardiac/Peripheral Catheterization   You are scheduled for a Cardiac Catheterization on Friday, October 13 with Dr. Sherren Mocha.  1. Please arrive at the Main Entrance A at [facility]: [place], [place] [address] on October 13 at 7:30 AM (This time is two hours before your procedure to ensure your preparation). Free valet parking service is available. You will check in at ADMITTING. The support person will be asked to wait in the waiting room.  It is OK to have someone drop you off and come back when you are ready to be discharged.        Special note: Every effort is made to have your procedure done on time. Please understand that emergencies sometimes delay scheduled procedures.   Marland Kitchen  2. Diet: Do not eat solid foods after midnight.  You may have clear liquids until 5 AM the day of the procedure.  3. Labs: You will need to have blood drawn today (CBC, BMP)  You do not need to be fasting.  4. Medication instructions in preparation for your procedure:   Contrast Allergy: No Hold Eliquis starting tomorrow Wednesday 11/18/21, Thursday 11/19/21, and day of procedure  On the morning of your procedure, take  Aspirin 81 mg and any morning medicines NOT listed above.  You may use sips of water.  5. Plan to go home the same day, you will only stay overnight if medically necessary. 6. You MUST have a responsible adult to drive you home. 7. An adult MUST be with you the first 24 hours after you arrive home. 8. Bring a current list of your medications, and the last time and date medication taken. 9. Bring ID and current insurance cards. 10.Please wear clothes that are easy to get on and off and wear slip-on shoes.  Thank you for allowing [place] to care for you!   -- [facility]

## 2021-11-17 NOTE — Progress Notes (Signed)
OFFICE NOTE  Chief Complaint:  Dizziness, fatigue, palpitations, progressive dyspnea  Primary Care Physician: Maury Dus, MD  HPI:  Charles Wright is a 86 y.o. male with a past medial history significant for coronary artery disease with non-STEMI in 2016.  He was found to have distal LAD disease not amenable to PCI as well as moderate mid to proximal LAD disease.  Medical therapy was recommended.  He also has a history of bladder cancer, prostate cancer, hyperlipidemia, hypertension and OSA on CPAP.  He has been followed by Dr. Wynonia Lawman.  He maintains on clopidogrel but not aspirin.  Recently he has been having issues with ureteral stricture and is recommended to have a cystoscopy with Dr. Junious Silk.  He would require clearance including holding clopidogrel prior to that procedure.  Today in the office he is coughing significantly.  He apparently had upper respiratory infection and then also had concomitant rash which was painful and vesicular and diagnosed as shingles by a dermatologist that he is friends with.  Besides his shortness of breath related upper respiratory infection as well as chest wall pain from shingles, he denies any chest pain concerning for angina or recent worsening shortness of breath.  10/10/2018  Charles Wright returns today for follow-up of coronary disease and atrial fibrillation.  He is a former patient of Dr. Wynonia Lawman however Dr. Wynonia Lawman is now retired.  When we last spoke he received preoperative clearance for urologic procedure.  He does have a history of a bladder cancer and hematuria.  Interestingly, based on further chart review, he had had a prior watchman procedure for his A. fib attempt at stroke reduction.  At that time because of bleeding he was not felt to be a candidate for anticoagulation for his A. fib.  He had been on amiodarone.  He was then referred for watchman procedure which shows ultimately unsuccessful.  I was involved in the imaging component of that procedure.   Subsequently it was recommended he go on aspirin and Plavix since he was not a candidate for anticoagulation due to GU bleeding from bladder cancer.  That has however been successfully treated and has had no further bleeding issues.  He also just had recent ureteral stenting which seems to have been successful.  When reviewing his chart today however it it notes that he is only on clopidogrel monotherapy.  Have an STEMI in 2016 and was found to have significant coronary disease however not amenable to intervention.  He is done well with that without any further chest pain however did have significant fatigue recently.  It turned out this was related to low testosterone and he is now on testosterone replacement.  Likely a side effect of antiandrogen therapy.  Recent labs show total cholesterol 158, HDL 50 LDL, 86 and triglycerides 115.  05/02/2019  Charles Wright returns today for follow-up. He is complaining of some issues with lightheadedness. He recently underwent surgery for what was suspicious for a left upper tract urothelial carcinoma. This was complicated by pain and bladder spasms requiring multiple ER visits. He has had a decreased appetite and weight loss (5 pounds of which was his left kidney/ureter. Additionally, he is down a total of 20 pounds since I last saw him. This is pertinent because his blood pressure today was 90/60. In general it has run higher than this. I suspect it may be a reason why feels lightheaded. His EKG shows a sinus bradycardia at 55. Possible medications that could causes include his isosorbide  and metoprolol which she takes 50 mg twice daily.  08/21/2019  Charles Wright seen today in follow-up.  Overall he continues to do well.  He says since he had his nephrectomies had no further bleeding issues.  He denies any chest pain or worsening shortness of breath.  Recently had some orthostatic hypotension with low blood pressure noted to be in the 91Y systolic.  He was taken off of his  Imdur and his beta-blocker was decreased.  Blood pressure today was 116/72 and his mention he feels well.  01/17/2020  Charles Wright is seen today for follow-up.  Over the summer he had chest pain and was seen by Almyra Deforest, PA-C.  Stress test was performed which showed small fixed apical defect without any ischemia, consistent with known distal LAD severe stenosis.  This vessel was thought to be too small to intervene on.  No other reversible ischemia was appreciated.  He noted that his chest pain was not improved with nitroglycerin.  During his exam he was also found to have bibasilar dry crackles consistent with possible interstitial lung disease.  High-resolution CT scan was performed which did corroborate this.  He has subsequently been referred to pulmonary and saw Dr. Vaughan Browner, who feels he may have IPF.  He has pending pulmonary function tests and follow-up with him in January.  He reports he still gets some intermittent chest discomfort in the left upper chest.  04/18/2020  Charles Wright returns for follow-up.  He continues to have work-up of pulmonary fibrosis.  He remains on low-dose Eliquis 2.5 mg twice daily.  Heart rate is in the 60s today.  EKG shows sinus rhythm with frequent PVCs in a quadrigeminal pattern.  He says he is really unaware of any extrasystoles or palpitations.  Weight is down about 6 pounds since we last saw him.  04/21/2021  Charles Wright returns today for follow-up.  He had called the office last year about concerns of palpitations, dizziness and fatigue.  This has been going on and off for the last year.  I was concerned that he might be having recurrent atrial fibrillation or perhaps PVCs.  We asked him to come to the office for an appointment but have not seen him until today.  EKG today does demonstrate frequent PVCs these in fact ventricular bigeminy.  If he has been in this for a good period of time he may have developed some cardiomyopathy.  11/17/2021  Charles Wright returns today  for follow-up.  He continues to have episodes of dizziness, fatigue and progressive dyspnea on exertion.  He was seen in April by Laurann Montana, NP and noted to be having more palpitations.  She had proposed increasing his metoprolol.  He wore a monitor which showed some recurrent atrial fibrillation/flutter and SVT with a low burden of about 3%.  His metoprolol was then increased up to 50 mg twice daily.  This provided perhaps a little bit of benefit however subsequently he has become worse.  His energy level is poor.  He is quite fatigued and gets short of breath easily.  He is now waking up in the middle the night with palpitations.  He denies any chest pain.  Of note he had a cardiac catheterization back in 2016 which showed 99% stenosis of the distal LAD at the time he had NSTEMI as well as 50 to 60% stenosis of the mid LAD.  My concern is that he has had progression of this coronary disease.  PMHx:  Past  Medical History:  Diagnosis Date   Acute gout of left ankle    06-14-2015   Bladder cancer (HCC)    BCG's tx's   BPH (benign prostatic hypertrophy)    CAD (coronary artery disease) CARDIOLOGIST-  DR TILLEY   a. NSTEMI 12/2014 -  99% dLAD-2 (not amenable to PCI), 50% dLAD-1, 60% mLAD. Medical therapy was recommended.   Cancer of kidney (HCC)    left   Cough    HARSH NONPRODUCTIVE COUGH 1 AND 1/2 WEEKS   GERD (gastroesophageal reflux disease)    takes  OTC periodically   Gilbert's syndrome    H/O cardiac catheterization    a. Note: Difficult radial access in 12/2014 - recommend femoral approach if cath needed in the future.   History of kidney stones    History of non-ST elevation myocardial infarction (NSTEMI)    12-12-2014   CARDIAC CATH W/ NO INTERVENTION X 2FEW DAYS APART   History of spinal fracture 2002   LUMBAR   Hyperlipidemia    Hypertension    Hypothyroidism    Myocardial infarction (HCC)    OSA on CPAP    SET ON 7 USES SOME NIGHTS   Paroxysmal A-fib [facility])     cardiologist-  dr Wynonia Lawman   Prostate cancer [facility]) 2019   last PSA  2.9  (montiored by urologist  dr Junious Silk) [place] FEB 2019 RAD DONE   Shingles    2 weeks ago   Wears glasses     Past Surgical History:  Procedure Laterality Date   BACK SURGERY  2014   LOWER l 1, 2 4 AND 5   BILATERAL INGUINAL HERNIA REPAIR     CARDIAC CATHETERIZATION N/A 12/13/2014   Procedure: Left Heart Cath and Coronary Angiography;  Surgeon: Leonie Man, MD;  Location: [place] [facility];  Service: Cardiovascular;  Laterality: N/A;  culprit lesion diat LAD-2  99%, not approachable via PCTA  due to extremely tortuous up stream LAD/  mLAD 60% and dLAD-1 50%, both are at the extremely tortuous segment and not PCI targets/  otherwise normal coronary arteries   COLONOSCOPY WITH PROPOFOL N/A 01/16/2019   Procedure: COLONOSCOPY WITH PROPOFOL;  Surgeon: Clarene Essex, MD;  Location: [facility];  Service: Endoscopy;  Laterality: N/A;   CYSTOSCOPY W/ RETROGRADES Left 08/22/2012   Procedure: CYSTOSCOPY WITH RETROGRADE PYELOGRAM;  left kidney washings;  Surgeon: Fredricka Bonine, MD;  Location: [facility];  Service: Urology;  Laterality: Left;   CYSTOSCOPY W/ RETROGRADES N/A 07/08/2015   Procedure: CYSTOSCOPY WITH RETROGRADE PYELOGRAM;  Surgeon: [name], MD;  Location: [facility];  Service: Urology;  Laterality: N/A;   CYSTOSCOPY W/ URETERAL STENT PLACEMENT Left 03/24/2019   Procedure: CYSTOSCOPY WITH RETROGRADE PYELOGRAM/URETERAL STENT PLACEMENT ureteroscopy biopsy of neoplasm;  Surgeon: [name], MD;  Location: [facility];  Service: Urology;  Laterality: Left;   CYSTOSCOPY W/ URETERAL STENT PLACEMENT Left 04/08/2019   Procedure: CYSTOSCOPY cystogram clot evacuation left ureteroscopy clot evacuation left stent exchange liimited transuretheral resectio of prastate and fulguration  bleeders of prostate;  Surgeon: [name], MD;  Location: [facility];  Service: Urology;   Laterality: Left;   CYSTOSCOPY WITH BIOPSY  08/22/2012   Procedure: CYSTOSCOPY WITH BIOPSY;  Surgeon: Fredricka Bonine, MD;  Location[facility];  Service: Urology;;   CYSTOSCOPY WITH BIOPSY N/A 11/08/2014   Procedure: CYSTOSCOPY/BIOPSPY BLADDER INSTILLATION OF MARCAINE AND PYRIDIUM;  Surgeon: [name], MD;  Location: [facility];  Service: Urology;  Laterality: N/A;   CYSTOSCOPY WITH BIOPSY N/A 07/08/2015   Procedure: CYSTOSCOPY WITH BLADDER BIOPSY, FULGURATION, RETROGRADE PYLOGRAM AND RENAL WASHING;  Surgeon: [name], MD;  Location: [facility];  Service: Urology;  Laterality: N/A;   CYSTOSCOPY WITH RETROGRADE PYELOGRAM, URETEROSCOPY AND STENT PLACEMENT Bilateral 07/30/2014   Procedure: CYSTOSCOPY WITH BLADDER BX FULERGATION AND BILATERAL RETROGRADE PYELOGRAM,;  Surgeon: [name], MD;  Location: [facility];  Service: Urology;  Laterality: Bilateral;   CYSTOSCOPY WITH STENT PLACEMENT Left 03/24/2018   Procedure: STENT PLACEMENT AND BIOPSY;  Surgeon: [name], MD;  Location: [facility];  Service: Urology;  Laterality: Left;   CYSTOSCOPY/RETROGRADE/URETEROSCOPY Bilateral 08/17/2013   Procedure: CYSTOSCOPY, BLADDER BIOPSY WITH BILATERAL RETROGRADE PYELOGRAM, LEFT URETEROSCOPY AND DILATION OF STRICTURE ;  Surgeon: [name], MD;  Location: [facility];  Service: Urology;  Laterality: Bilateral;   CYSTOSCOPY/RETROGRADE/URETEROSCOPY Left 03/24/2018   Procedure: CYSTOSCOPY/RETROGRADE/URETEROSCOPY;  Surgeon: [name], MD;  Location: [facility];  Service: Urology;  Laterality: Left;   HOT HEMOSTASIS N/A 01/16/2019   Procedure: HOT HEMOSTASIS (ARGON PLASMA COAGULATION/BICAP);  Surgeon: Clarene Essex, MD;  Location: [facility];  Service: Endoscopy;  Laterality: N/A;   INSERTION OF MESH N/A 08/20/2020   Procedure: INSERTION OF MESH;  Surgeon: Ralene Ok, MD;   Location: [place];  Service: General;  Laterality: N/A;   LEFT ATRIAL APPENDAGE OCCLUSION N/A 01/09/2015   Procedure: LEFT ATRIAL APPENDAGE OCCLUSION;  Surgeon: [name], MD;  Location: [place] [facility];  Service: Cardiovascular;  Laterality: N/A;   LUMBAR LAMINECTOMY/DECOMPRESSION MICRODISCECTOMY Left 12/03/2013   Procedure: Left Lumbar three-four diskectomy with Lumbar four laminectomy ;  Surgeon: Newman Pies, MD;  Location: [place] [facility];  Service: Neurosurgery;  Laterality: Left;  Left Lumbar three-four diskectomy with Lumbar four laminectomy    LYMPH NODE DISSECTION Bilateral 04/13/2019   Procedure: LYMPH NODE DISSECTION;  Surgeon: Alexis Frock, MD;  Location: [facility];  Service: Urology;  Laterality: Bilateral;   ORIF LEFT ANKLE FX  2005   POLYPECTOMY  01/16/2019   Procedure: POLYPECTOMY;  Surgeon: Clarene Essex, MD;  [facility];  Service: Endoscopy;;   PROSTATE BIOPSY N/A 08/22/2012   Procedure: BIOPSY TRANSRECTAL ULTRASONIC PROSTATE (TUBP);  Surgeon: Fredricka Bonine, MD;  Location: [facility];  Service: Urology;  Laterality: N/A;   ROBOT ASSITED LAPAROSCOPIC NEPHROURETERECTOMY Left 04/13/2019   Procedure: XI ROBOT ASSITED LAPAROSCOPIC NEPHROURETERECTOMY;  Surgeon: Alexis Frock, MD;  Location: [facility];  Service: Urology;  Laterality: Left;  3 HRS   TEE WITHOUT CARDIOVERSION N/A 12/31/2014   Procedure: TRANSESOPHAGEAL ECHOCARDIOGRAM (TEE);  Surgeon: Pixie Casino, MD;  Location: [facility];  Service: Cardiovascular;  Laterality: N/A;  ef 55-60%/  mild MR, TR, and PR/  mild LAE and RAE   TRANSURETHRAL RESECTION OF BLADDER TUMOR  10/22/2011   Procedure: TRANSURETHRAL RESECTION OF BLADDER TUMOR (TURBT);  Surgeon: Fredricka Bonine, MD;  Location: [facility];  Service: Urology;  Laterality: N/A;  TURBT, LEFT URETEROSCOPY, POSSIBLE URETERAL STENT    URETEROSCOPY  10/22/2011   Procedure: URETEROSCOPY;  Surgeon: Fredricka Bonine,  MD;  Location: [facility];  Service: Urology;  Laterality: Left;   WISDOM TOOTH EXTRACTION     XI ROBOTIC ASSISTED VENTRAL HERNIA N/A 08/20/2020   Procedure: ROBOTIC INCISIONAL HERNIA REPAIR WITH MESH;  Surgeon: Ralene Ok, MD;  Location: [place];  Service: General;  Laterality: N/A;    FAMHx:  Family History  Problem  Relation Age of Onset   Leukemia Mother    Heart attack Father    Cancer Sister        breast   Hypertension Neg Hx    Stroke Neg Hx    Colon cancer Neg Hx    Esophageal cancer Neg Hx    Rectal cancer Neg Hx    Stomach cancer Neg Hx     SOCHx:   reports that he has never smoked. He has never used smokeless tobacco. He reports current alcohol use of about 7.0 standard drinks of alcohol per week. He reports that he does not use drugs.  ALLERGIES:  Allergies  Allergen Reactions   Bee Venom Anaphylaxis    Actually Hornets and Wasps.    Losartan Cough        Oxycodone Other (See Comments)    ALTERED MENTAL STATUS GETS MEAN   Prednisone     Other reaction(s): personality change    ROS: Pertinent items noted in HPI and remainder of comprehensive ROS otherwise negative.  HOME MEDS: Current Outpatient Medications on File Prior to Visit  Medication Sig Dispense Refill   acetaminophen (TYLENOL) 500 MG tablet Take 500 mg by mouth every 6 (six) hours as needed for moderate pain or headache.     allopurinol (ZYLOPRIM) 300 MG tablet Take 300 mg by mouth every morning.      apixaban (ELIQUIS) 2.5 MG TABS tablet TAKE ONE TABLET BY MOUTH TWICE A DAY 180 tablet 1   aspirin EC 81 MG tablet Take 81 mg by mouth daily.     Carboxymethylcellul-Glycerin (REFRESH RELIEVA OP) Place 1 drop into both eyes daily as needed (dry eyes).     Cholecalciferol (DIALYVITE VITAMIN D 5000) 125 MCG (5000 UT) capsule Take 5,000 Units by mouth daily.     doxycycline (MONODOX) 50 MG capsule Take 50 mg by mouth daily as needed (rosacea).     EPINEPHrine 0.3 mg/0.3 mL IJ SOAJ  injection Inject 0.3 mLs (0.3 mg total) into the muscle once. (Patient taking differently: Inject 0.3 mg into the muscle as needed for anaphylaxis.) 2 Device 1   GLUCOSAMINE SULFATE PO Take 750 mg by mouth daily.     levothyroxine (SYNTHROID, LEVOTHROID) 50 MCG tablet Take 50 mcg by mouth daily before breakfast.     metoprolol tartrate (LOPRESSOR) 50 MG tablet Take 1 tablet (50 mg total) by mouth 2 (two) times daily. Take additional half tablet as needed for for palpitations. 180 tablet 3   Multiple Vitamin (MULTIVITAMIN PO) Take 1 tablet by mouth daily.     nitroGLYCERIN (NITROSTAT) 0.4 MG SL tablet Place 1 tablet (0.4 mg total) under the tongue every 5 (five) minutes x 3 doses as needed for chest pain. 25 tablet 12   rosuvastatin (CRESTOR) 20 MG tablet Take 1 tablet (20 mg total) by mouth daily. 90 tablet 3   Testosterone 1.62 % GEL Apply 2 Pump topically every other day.      No current facility-administered medications on file prior to visit.    LABS/IMAGING: No results found for this or any previous visit (from the past 48 hour(s)). No results found.  LIPID PANEL:    Component Value Date/Time   CHOL 111 09/01/2021 0947   TRIG 90 09/01/2021 0947   HDL 38 (L) 09/01/2021 0947   CHOLHDL 2.9 09/01/2021 0947   CHOLHDL 4.4 12/12/2014 1721   VLDL 18 12/12/2014 1721   LDLCALC 55 09/01/2021 0947   LDLDIRECT 152 (H) 05/14/2021 1428  WEIGHTS: Wt Readings from Last 3 Encounters:  11/17/21 192 lb 12.8 oz (87.5 kg)  05/14/21 196 lb 6.4 oz (89.1 kg)  04/21/21 199 lb (90.3 kg)    VITALS: BP 108/71   Pulse 65   Ht '5\' 7"'$  (1.702 m)   Wt 192 lb 12.8 oz (87.5 kg)   SpO2 97%   BMI 30.20 kg/m   EXAM: General appearance: alert and no distress Neck: no carotid bruit, no JVD and thyroid not enlarged, symmetric, no tenderness/mass/nodules Lungs: rales bibasilar Heart: regular rate and rhythm Abdomen: soft, non-tender; bowel sounds normal; no masses,  no organomegaly and  obese Extremities: extremities normal, atraumatic, no cyanosis or edema Pulses: 2+ and symmetric Skin: Skin color, texture, turgor normal. No rashes or lesions Neurologic: Grossly normal : Pleasant  EKG: Normal sinus rhythm at 65-personally reviewed  ASSESSMENT: Dizziness, fatigue, progressive dyspnea History of NSTEMI - found to have multivessel coronary artery disease, including 99% LAD distal stenosis (not amenable to PCI) and 50 to 60% mid LAD stenosis of a tortuous vessel (12/2014), without angina History of bladder CA with hematuria, resolved Failed Watchman LAA procedure in 2016 History of hypertensive heart disease PAF - CHADVASC score of 5 Obesity Hyperlipidemia OSA on CPAP History of TIA/stroke Hypothyroidism Possible left upper tract urothelial carcinoma-status post resection and radical left nephro ureterectomy. Interstitial lung disease -multiple COVID-19 infections Frequent PVCs -ventricular bigeminy  PLAN: 1.   Charles Wright has had worsening dizziness, fatigue and progressive dyspnea including nocturnal awakenings with palpitations.  His monitor showed a low burden of A-fib/flutter which could be partially responsible for this.  He had minimal improvement with an increase in his metoprolol.  Heart rate and blood pressure are in the normal range and EKG today shows a normal sinus rhythm.  He does have a history of at least moderate to severe coronary disease with prior NSTEMI and was found to have severe distal LAD disease not amenable to PCI with moderate mid-LAD stenosis.  He could certainly have had progression of his coronary disease since that time.  I am recommending a repeat heart catheterization.  He does have a history of urothelial carcinoma and is status post resection of the left kidney.  Creatinine is 1.5 and he will need prehydration and judicious use of contrast for the procedure.  He is also on Eliquis and would have to hold this for least 2 days prior to the  procedure.  We will try to schedule it this week on Friday.  If there is no significant obstructive coronary disease, then would likely refer back to EP for recommendations of management of recurrent atrial arrhythmias.  Shared Decision Making/Informed Consent The risks [stroke (1 in 1000), death (1 in 1000), kidney failure [usually temporary] (1 in 500), bleeding (1 in 200), allergic reaction [possibly serious] (1 in 200)], benefits (diagnostic support and management of coronary artery disease) and alternatives of a cardiac catheterization were discussed in detail with Charles Wright and he is willing to proceed.   Pixie Casino, MD, [facility], [place] Director of the [facility] Lipid Disorders &  Cardiovascular Risk Reduction Clinic Diplomate of the American Board of Clinical Lipidology Attending Cardiologist  Direct Dial: [phone]  Fax: [phone]  Website:  [email or web address]   Nadean Corwin Cyree Chuong 11/17/2021, 2:29 PM

## 2021-11-18 LAB — CBC
Hematocrit: 46.9 % (ref 37.5–51.0)
Hemoglobin: 16 g/dL (ref 13.0–17.7)
MCH: 31.3 pg (ref 26.6–33.0)
MCHC: 34.1 g/dL (ref 31.5–35.7)
MCV: 92 fL (ref 79–97)
Platelets: 285 10*3/uL (ref 150–450)
RBC: 5.11 x10E6/uL (ref 4.14–5.80)
RDW: 13.2 % (ref 11.6–15.4)
WBC: 10.2 10*3/uL (ref 3.4–10.8)

## 2021-11-18 LAB — BASIC METABOLIC PANEL
BUN/Creatinine Ratio: 17 (ref 10–24)
BUN: 26 mg/dL (ref 8–27)
CO2: 22 mmol/L (ref 20–29)
Calcium: 10.1 mg/dL (ref 8.6–10.2)
Chloride: 99 mmol/L (ref 96–106)
Creatinine, Ser: 1.52 mg/dL — ABNORMAL HIGH (ref 0.76–1.27)
Glucose: 82 mg/dL (ref 70–99)
Potassium: 4.9 mmol/L (ref 3.5–5.2)
Sodium: 137 mmol/L (ref 134–144)
eGFR: 45 mL/min/{1.73_m2} — ABNORMAL LOW (ref >59)

## 2021-11-19 ENCOUNTER — Telehealth: Payer: Self-pay | Admitting: *Deleted

## 2021-11-19 NOTE — Telephone Encounter (Signed)
Cardiac Catheterization scheduled at [facility] for: Friday November 20, 2021 12 Noon Arrival time and place: [facility] Main Entrance A at: 7:30 AM-pre-procedure hydration (could not be there at 7 AM)  Nothing to eat after midnight prior to procedure, clear liquids until 5 AM day of procedure.  Medication instructions: -Hold:  Eliquis-none 11/18/21 until post procedure -Except hold medications usual morning medications can be taken with sips of water including aspirin 81 mg.  Confirmed patient has responsible adult to drive home post procedure and be with patient first 24 hours after arriving home.  Patient reports no new symptoms concerning for COVID-19 in the past 10 days.  Reviewed procedure instructions, pre-procedure hydration with patient.

## 2021-11-20 ENCOUNTER — Encounter (HOSPITAL_COMMUNITY)
Admission: RE | Disposition: A | Discharge status: Home / Self Care | Payer: Self-pay | Source: Home / Self Care | Attending: Cardiovascular Disease

## 2021-11-20 ENCOUNTER — Other Ambulatory Visit: Payer: Self-pay

## 2021-11-20 ENCOUNTER — Ambulatory Visit (HOSPITAL_COMMUNITY)
Admission: RE | Admit: 2021-11-20 | Discharge: 2021-11-20 | Disposition: A | Discharge status: Home / Self Care | Payer: Medicare Other | Attending: Cardiovascular Disease | Admitting: Cardiovascular Disease

## 2021-11-20 DIAGNOSIS — Z79899 Other long term (current) drug therapy: Secondary | ICD-10-CM | POA: Diagnosis not present

## 2021-11-20 DIAGNOSIS — E039 Hypothyroidism, unspecified: Secondary | ICD-10-CM | POA: Insufficient documentation

## 2021-11-20 DIAGNOSIS — I10 Essential (primary) hypertension: Secondary | ICD-10-CM | POA: Diagnosis not present

## 2021-11-20 DIAGNOSIS — J849 Interstitial pulmonary disease, unspecified: Secondary | ICD-10-CM | POA: Diagnosis not present

## 2021-11-20 DIAGNOSIS — E785 Hyperlipidemia, unspecified: Secondary | ICD-10-CM | POA: Diagnosis not present

## 2021-11-20 DIAGNOSIS — I2510 Atherosclerotic heart disease of native coronary artery without angina pectoris: Secondary | ICD-10-CM | POA: Insufficient documentation

## 2021-11-20 DIAGNOSIS — Z8551 Personal history of malignant neoplasm of bladder: Secondary | ICD-10-CM | POA: Diagnosis not present

## 2021-11-20 DIAGNOSIS — G4733 Obstructive sleep apnea (adult) (pediatric): Secondary | ICD-10-CM | POA: Diagnosis not present

## 2021-11-20 DIAGNOSIS — R0602 Shortness of breath: Secondary | ICD-10-CM | POA: Diagnosis not present

## 2021-11-20 DIAGNOSIS — I493 Ventricular premature depolarization: Secondary | ICD-10-CM | POA: Insufficient documentation

## 2021-11-20 DIAGNOSIS — Z7901 Long term (current) use of anticoagulants: Secondary | ICD-10-CM | POA: Insufficient documentation

## 2021-11-20 DIAGNOSIS — I480 Paroxysmal atrial fibrillation: Secondary | ICD-10-CM | POA: Insufficient documentation

## 2021-11-20 DIAGNOSIS — Z8546 Personal history of malignant neoplasm of prostate: Secondary | ICD-10-CM | POA: Diagnosis not present

## 2021-11-20 DIAGNOSIS — Z7902 Long term (current) use of antithrombotics/antiplatelets: Secondary | ICD-10-CM | POA: Insufficient documentation

## 2021-11-20 DIAGNOSIS — I252 Old myocardial infarction: Secondary | ICD-10-CM | POA: Diagnosis not present

## 2021-11-20 DIAGNOSIS — Z8673 Personal history of transient ischemic attack (TIA), and cerebral infarction without residual deficits: Secondary | ICD-10-CM | POA: Diagnosis not present

## 2021-11-20 HISTORY — PX: LEFT HEART CATH AND CORONARY ANGIOGRAPHY: CATH118249

## 2021-11-20 SURGERY — LEFT HEART CATH AND CORONARY ANGIOGRAPHY
Anesthesia: LOCAL

## 2021-11-20 MED ORDER — ACETAMINOPHEN 325 MG PO TABS: 650.0000 mg | ORAL_TABLET | ORAL | Status: DC | PRN

## 2021-11-20 MED ORDER — FENTANYL CITRATE (PF) 100 MCG/2ML IJ SOLN
INTRAMUSCULAR | Status: DC | PRN
  Administered 2021-11-20: 25 ug via INTRAVENOUS

## 2021-11-20 MED ORDER — LABETALOL HCL 5 MG/ML IV SOLN: 10.0000 mg | INTRAVENOUS | Status: DC | PRN

## 2021-11-20 MED ORDER — SODIUM CHLORIDE 0.9 % IV SOLN: 250.0000 mL | INTRAVENOUS | Status: DC | PRN

## 2021-11-20 MED ORDER — HEPARIN (PORCINE) IN NACL 1000-0.9 UT/500ML-% IV SOLN
INTRAVENOUS | Status: AC
  Filled 2021-11-20: qty 1000

## 2021-11-20 MED ORDER — SODIUM CHLORIDE 0.9% FLUSH: 3.0000 mL | INTRAVENOUS | Status: DC | PRN

## 2021-11-20 MED ORDER — ONDANSETRON HCL 4 MG/2ML IJ SOLN: 4.0000 mg | Freq: Four times a day (QID) | INTRAMUSCULAR | Status: DC | PRN

## 2021-11-20 MED ORDER — LIDOCAINE HCL (PF) 1 % IJ SOLN
INTRAMUSCULAR | Status: AC
  Filled 2021-11-20: qty 30

## 2021-11-20 MED ORDER — SODIUM CHLORIDE 0.9% FLUSH: 3.0000 mL | Freq: Two times a day (BID) | INTRAVENOUS | Status: DC

## 2021-11-20 MED ORDER — ASPIRIN 81 MG PO CHEW: 81.0000 mg | CHEWABLE_TABLET | ORAL | Status: DC

## 2021-11-20 MED ORDER — SODIUM CHLORIDE 0.9 % WEIGHT BASED INFUSION
3.0000 mL/kg/h | INTRAVENOUS | Status: AC
  Administered 2021-11-20: 3 mL/kg/h via INTRAVENOUS

## 2021-11-20 MED ORDER — MIDAZOLAM HCL 2 MG/2ML IJ SOLN
INTRAMUSCULAR | Status: DC | PRN
  Administered 2021-11-20: 2 mg via INTRAVENOUS

## 2021-11-20 MED ORDER — HYDRALAZINE HCL 20 MG/ML IJ SOLN: 10.0000 mg | INTRAMUSCULAR | Status: DC | PRN

## 2021-11-20 MED ORDER — IOHEXOL 350 MG/ML SOLN
INTRAVENOUS | Status: DC | PRN
  Administered 2021-11-20: 35 mL

## 2021-11-20 MED ORDER — MIDAZOLAM HCL 2 MG/2ML IJ SOLN
INTRAMUSCULAR | Status: AC
  Filled 2021-11-20: qty 2

## 2021-11-20 MED ORDER — SODIUM CHLORIDE 0.9 % WEIGHT BASED INFUSION: 1.0000 mL/kg/h | INTRAVENOUS | Status: DC

## 2021-11-20 MED ORDER — FENTANYL CITRATE (PF) 100 MCG/2ML IJ SOLN
INTRAMUSCULAR | Status: AC
  Filled 2021-11-20: qty 2

## 2021-11-20 MED ORDER — SODIUM CHLORIDE 0.9 % IV SOLN: INTRAVENOUS | Status: DC

## 2021-11-20 MED ORDER — LIDOCAINE HCL (PF) 1 % IJ SOLN
INTRAMUSCULAR | Status: DC | PRN
  Administered 2021-11-20: 10 mL

## 2021-11-20 MED ORDER — HEPARIN (PORCINE) IN NACL 1000-0.9 UT/500ML-% IV SOLN
INTRAVENOUS | Status: DC | PRN
  Administered 2021-11-20 (×2): 500 mL

## 2021-11-20 SURGICAL SUPPLY — 10 items
CATH INFINITI 5FR MULTPACK ANG (CATHETERS) IMPLANT
CLOSURE MYNX CONTROL 5F (Vascular Products) IMPLANT
KIT HEART LEFT (KITS) ×1 IMPLANT
KIT MICROPUNCTURE NIT STIFF (SHEATH) IMPLANT
PACK CARDIAC CATHETERIZATION (CUSTOM PROCEDURE TRAY) ×1 IMPLANT
SHEATH PINNACLE 5F 10CM (SHEATH) IMPLANT
SHEATH PROBE COVER 6X72 (BAG) IMPLANT
TRANSDUCER W/STOPCOCK (MISCELLANEOUS) ×1 IMPLANT
TUBING CIL FLEX 10 FLL-RA (TUBING) ×1 IMPLANT
WIRE EMERALD 3MM-J .035X150CM (WIRE) IMPLANT

## 2021-11-20 NOTE — Interval H&P Note (Signed)
History and Physical Interval Note:  11/20/2021 10:11 AM  Charles Wright  has presented today for surgery, with the diagnosis of cad - shortness of breath.  The various methods of treatment have been discussed with the patient and family. After consideration of risks, benefits and other options for treatment, the patient has consented to  Procedure(s): LEFT HEART CATH AND CORONARY ANGIOGRAPHY (N/A) as a surgical intervention.  The patient's history has been reviewed, patient examined, no change in status, stable for surgery.  I have reviewed the patient's chart and labs.  Questions were answered to the patient's satisfaction.     Sherren Mocha

## 2021-11-20 NOTE — Telephone Encounter (Signed)
Patient seen in the office 11/17/21

## 2021-11-23 ENCOUNTER — Encounter (HOSPITAL_COMMUNITY): Payer: Self-pay | Admitting: Cardiovascular Disease

## 2021-11-24 DIAGNOSIS — J8410 Pulmonary fibrosis, unspecified: Secondary | ICD-10-CM | POA: Diagnosis not present

## 2021-11-24 DIAGNOSIS — R053 Chronic cough: Secondary | ICD-10-CM | POA: Diagnosis not present

## 2021-11-24 DIAGNOSIS — R1032 Left lower quadrant pain: Secondary | ICD-10-CM | POA: Diagnosis not present

## 2021-11-24 DIAGNOSIS — J3489 Other specified disorders of nose and nasal sinuses: Secondary | ICD-10-CM | POA: Diagnosis not present

## 2021-11-25 ENCOUNTER — Other Ambulatory Visit: Payer: Self-pay | Admitting: Internal Medicine

## 2021-11-25 DIAGNOSIS — I480 Paroxysmal atrial fibrillation: Secondary | ICD-10-CM

## 2021-12-28 ENCOUNTER — Ambulatory Visit: Payer: Medicare Other | Attending: Cardiovascular Disease | Admitting: Cardiovascular Disease

## 2021-12-28 ENCOUNTER — Encounter: Payer: Self-pay | Admitting: Cardiovascular Disease

## 2021-12-28 ENCOUNTER — Telehealth: Payer: Self-pay | Admitting: Pharmacist

## 2021-12-28 VITALS — BP 116/70 | HR 61 | Ht 67.0 in | Wt 186.8 lb

## 2021-12-28 DIAGNOSIS — I480 Paroxysmal atrial fibrillation: Secondary | ICD-10-CM

## 2021-12-28 NOTE — Telephone Encounter (Addendum)
Medication list reviewed in anticipation of upcoming Tikosyn initiation. Patient is not taking any contraindicated or QTc prolonging medications.   Patient is anticoagulated on Eliquis 2.'5mg'$  BID on the appropriate dose based on 4 of the last 5 creatinine labs. Please ensure that patient has not missed any anticoagulation doses in the 3 weeks prior to Tikosyn initiation.   Patient will need to be counseled to avoid use of Benadryl while on Tikosyn and in the 2-3 days prior to Tikosyn initiation.

## 2021-12-28 NOTE — Progress Notes (Signed)
Electrophysiology Office Note:    Date:  12/28/2021   ID:  Charles Wright, DOB 1936/01/01, MRN [id number]  PCP:  Maury Dus, [place] [facility] Cardiologist:  Pixie Casino, MD Electrophysiologist:  Melida Quitter, MD     Referring MD: Pixie Casino, MD   Chief complaint: palpitations, fatigu  History of Present Illness:    Charles Wright is a 86 y.o. male with a hx of CAD and NSTEMI in 2016, bladder cancer, prostate cancer, HLD, HTN, OSA referred by dr. Debara Pickett for AF management.  He was diagnosed with AF sometime around 2020. He was managed for a while with amiodarone. He doesn't particularly recall how effective it was for him, but he was diagnosed with IPF and is no longer taking it. The IPF may have been due to [place].  He has episodes of palpitations. These occur most frequently at night and awake him from sleep. Episodes sometimes occur when he is working in the yard. When this happens, he has to go inside and rest. He does not have chest pain, orthopnea, PND. He has a diagnosis of OSA but could not tolerate CPAP. Since being diagnosed, he lost a significant amount of weight.  Past Medical History:  Diagnosis Date   Acute gout of left ankle    06-14-2015   Bladder cancer (HCC)    BCG's tx's   BPH (benign prostatic hypertrophy)    CAD (coronary artery disease) CARDIOLOGIST-  DR TILLEY   a. NSTEMI 12/2014 -  99% dLAD-2 (not amenable to PCI), 50% dLAD-1, 60% mLAD. Medical therapy was recommended.   Cancer of kidney (HCC)    left   Cough    HARSH NONPRODUCTIVE COUGH 1 AND 1/2 WEEKS   GERD (gastroesophageal reflux disease)    takes  OTC periodically   Gilbert's syndrome    H/O cardiac catheterization    a. Note: Difficult radial access in 12/2014 - recommend femoral approach if cath needed in the future.   History of kidney stones    History of non-ST elevation myocardial infarction (NSTEMI)    12-12-2014   CARDIAC CATH W/ NO INTERVENTION X 2FEW  DAYS APART   History of spinal fracture 2002   LUMBAR   Hyperlipidemia    Hypertension    Hypothyroidism    Myocardial infarction (HCC)    OSA on CPAP    SET ON 7 USES SOME NIGHTS   Paroxysmal A-fib [facility])    cardiologist-  dr Wynonia Lawman   Prostate cancer [facility]) 2019   last PSA  2.9  (montiored by urologist  dr Junious Silk) [place] FEB 2019 RAD DONE   Shingles    2 weeks ago   Wears glasses     Past Surgical History:  Procedure Laterality Date   BACK SURGERY  2014   LOWER l 1, 2 4 AND 5   BILATERAL INGUINAL HERNIA REPAIR     CARDIAC CATHETERIZATION N/A 12/13/2014   Procedure: Left Heart Cath and Coronary Angiography;  Surgeon: Leonie Man, MD;  Location: [place] [facility];  Service: Cardiovascular;  Laterality: N/A;  culprit lesion diat LAD-2  99%, not approachable via PCTA  due to extremely tortuous up stream LAD/  mLAD 60% and dLAD-1 50%, both are at the extremely tortuous segment and not PCI targets/  otherwise normal coronary arteries   COLONOSCOPY WITH PROPOFOL N/A 01/16/2019   Procedure: COLONOSCOPY WITH PROPOFOL;  Surgeon: Clarene Essex, MD;  [facility];  Service:  Endoscopy;  Laterality: N/A;   CYSTOSCOPY W/ RETROGRADES Left 08/22/2012   Procedure: CYSTOSCOPY WITH RETROGRADE PYELOGRAM;  left kidney washings;  Surgeon: Fredricka Bonine, MD;  Location: [facility];  Service: Urology;  Laterality: Left;   CYSTOSCOPY W/ RETROGRADES N/A 07/08/2015   Procedure: CYSTOSCOPY WITH RETROGRADE PYELOGRAM;  Surgeon: [name], MD;  Location: [facility];  Service: Urology;  Laterality: N/A;   CYSTOSCOPY W/ URETERAL STENT PLACEMENT Left 03/24/2019   Procedure: CYSTOSCOPY WITH RETROGRADE PYELOGRAM/URETERAL STENT PLACEMENT ureteroscopy biopsy of neoplasm;  Surgeon: [name], MD;  [facility];  Service: Urology;  Laterality: Left;   CYSTOSCOPY W/ URETERAL STENT PLACEMENT Left 04/08/2019   Procedure: CYSTOSCOPY cystogram clot evacuation  left ureteroscopy clot evacuation left stent exchange liimited transuretheral resectio of prastate and fulguration  bleeders of prostate;  Surgeon: [name], MD;  [facility];  Service: Urology;  Laterality: Left;   CYSTOSCOPY WITH BIOPSY  08/22/2012   Procedure: CYSTOSCOPY WITH BIOPSY;  Surgeon: Fredricka Bonine, MD;  [facility];  Service: Urology;;   CYSTOSCOPY WITH BIOPSY N/A 11/08/2014   Procedure: CYSTOSCOPY/BIOPSPY BLADDER INSTILLATION OF MARCAINE AND PYRIDIUM;  Surgeon: [name], MD;  Location: [facility];  Service: Urology;  Laterality: N/A;   CYSTOSCOPY WITH BIOPSY N/A 07/08/2015   Procedure: CYSTOSCOPY WITH BLADDER BIOPSY, FULGURATION, RETROGRADE PYLOGRAM AND RENAL WASHING;  Surgeon: [name], MD;  Location: [facility];  Service: Urology;  Laterality: N/A;   CYSTOSCOPY WITH RETROGRADE PYELOGRAM, URETEROSCOPY AND STENT PLACEMENT Bilateral 07/30/2014   Procedure: CYSTOSCOPY WITH BLADDER BX FULERGATION AND BILATERAL RETROGRADE PYELOGRAM,;  Surgeon: [name], MD;  [facility];  Service: Urology;  Laterality: Bilateral;   CYSTOSCOPY WITH STENT PLACEMENT Left 03/24/2018   Procedure: STENT PLACEMENT AND BIOPSY;  Surgeon: [name], MD;  Location: [facility];  Service: Urology;  Laterality: Left;   CYSTOSCOPY/RETROGRADE/URETEROSCOPY Bilateral 08/17/2013   Procedure: CYSTOSCOPY, BLADDER BIOPSY WITH BILATERAL RETROGRADE PYELOGRAM, LEFT URETEROSCOPY AND DILATION OF STRICTURE ;  Surgeon: [name], MD;  Location: [facility];  Service: Urology;  Laterality: Bilateral;   CYSTOSCOPY/RETROGRADE/URETEROSCOPY Left 03/24/2018   Procedure: CYSTOSCOPY/RETROGRADE/URETEROSCOPY;  Surgeon: [name], MD;  Location: [facility];  Service: Urology;  Laterality: Left;   HOT HEMOSTASIS N/A 01/16/2019   Procedure: HOT HEMOSTASIS (ARGON PLASMA  COAGULATION/BICAP);  Surgeon: Clarene Essex, MD;  Location: [facility];  Service: Endoscopy;  Laterality: N/A;   INSERTION OF MESH N/A 08/20/2020   Procedure: INSERTION OF MESH;  Surgeon: Ralene Ok, MD;  Location: [place];  Service: General;  Laterality: N/A;   LEFT ATRIAL APPENDAGE OCCLUSION N/A 01/09/2015   Procedure: LEFT ATRIAL APPENDAGE OCCLUSION;  Surgeon: [name], MD;  Location: [place] [facility];  Service: Cardiovascular;  Laterality: N/A;   LEFT HEART CATH AND CORONARY ANGIOGRAPHY N/A 11/20/2021   Procedure: LEFT HEART CATH AND CORONARY ANGIOGRAPHY;  Surgeon: Sherren Mocha, MD;  Location: [place] [facility];  Service: Cardiovascular;  Laterality: N/A;   LUMBAR LAMINECTOMY/DECOMPRESSION MICRODISCECTOMY Left 12/03/2013   Procedure: Left Lumbar three-four diskectomy with Lumbar four laminectomy ;  Surgeon: Newman Pies, MD;  Location: [place] [facility];  Service: Neurosurgery;  Laterality: Left;  Left Lumbar three-four diskectomy with Lumbar four laminectomy    LYMPH NODE DISSECTION Bilateral 04/13/2019   Procedure: LYMPH NODE DISSECTION;  Surgeon: Alexis Frock, MD;  [facility];  Service: Urology;  Laterality: Bilateral;   ORIF LEFT ANKLE FX  2005  POLYPECTOMY  01/16/2019   Procedure: POLYPECTOMY;  Surgeon: Clarene Essex, MD;  [facility];  Service: Endoscopy;;   PROSTATE BIOPSY N/A 08/22/2012   Procedure: BIOPSY TRANSRECTAL ULTRASONIC PROSTATE (TUBP);  Surgeon: Fredricka Bonine, MD;  Location: [facility];  Service: Urology;  Laterality: N/A;   ROBOT ASSITED LAPAROSCOPIC NEPHROURETERECTOMY Left 04/13/2019   Procedure: XI ROBOT ASSITED LAPAROSCOPIC NEPHROURETERECTOMY;  Surgeon: Alexis Frock, MD;  [facility];  Service: Urology;  Laterality: Left;  3 HRS   TEE WITHOUT CARDIOVERSION N/A 12/31/2014   Procedure: TRANSESOPHAGEAL ECHOCARDIOGRAM (TEE);  Surgeon: Pixie Casino, MD;  Location: [facility];  Service: Cardiovascular;  Laterality:  N/A;  ef 55-60%/  mild MR, TR, and PR/  mild LAE and RAE   TRANSURETHRAL RESECTION OF BLADDER TUMOR  10/22/2011   Procedure: TRANSURETHRAL RESECTION OF BLADDER TUMOR (TURBT);  Surgeon: Fredricka Bonine, MD;  Location: [facility];  Service: Urology;  Laterality: N/A;  TURBT, LEFT URETEROSCOPY, POSSIBLE URETERAL STENT    URETEROSCOPY  10/22/2011   Procedure: URETEROSCOPY;  Surgeon: Fredricka Bonine, MD;  Location: [facility];  Service: Urology;  Laterality: Left;   WISDOM TOOTH EXTRACTION     XI ROBOTIC ASSISTED VENTRAL HERNIA N/A 08/20/2020   Procedure: ROBOTIC INCISIONAL HERNIA REPAIR WITH MESH;  Surgeon: Ralene Ok, MD;  Location: [place];  Service: General;  Laterality: N/A;    Current Medications: Current Meds  Medication Sig   acetaminophen (TYLENOL) 500 MG tablet Take 1,000 mg by mouth every 6 (six) hours as needed for moderate pain or headache.   allopurinol (ZYLOPRIM) 300 MG tablet Take 300 mg by mouth every morning.    apixaban (ELIQUIS) 2.5 MG TABS tablet TAKE ONE TABLET BY MOUTH TWICE A DAY   aspirin EC 81 MG tablet Take 81 mg by mouth daily.   Carboxymethylcellul-Glycerin (REFRESH RELIEVA OP) Place 1 drop into both eyes daily as needed (dry eyes).   Cholecalciferol (DIALYVITE VITAMIN D 5000) 125 MCG (5000 UT) capsule Take 5,000 Units by mouth daily.   doxycycline (MONODOX) 50 MG capsule Take 50 mg by mouth daily as needed (rosacea).   EPINEPHrine 0.3 mg/0.3 mL IJ SOAJ injection Inject 0.3 mLs (0.3 mg total) into the muscle once. (Patient taking differently: Inject 0.3 mg into the muscle as needed for anaphylaxis.)   GLUCOSAMINE SULFATE PO Take 750 mg by mouth daily.   levothyroxine (SYNTHROID, LEVOTHROID) 50 MCG tablet Take 50 mcg by mouth daily before breakfast.   Multiple Vitamin (MULTIVITAMIN PO) Take 1 tablet by mouth daily.   nitroGLYCERIN (NITROSTAT) 0.4 MG SL tablet Place 1 tablet (0.4 mg total) under the tongue every 5 (five)  minutes x 3 doses as needed for chest pain.   Testosterone 1.62 % GEL Apply 2 Pump topically every other day.      Allergies:   Bee venom, Losartan, and Oxycodone   Social History   Socioeconomic History   Marital status: Married    Spouse name: Not on file   Number of children: 2   Years of education: Not on file   Highest education level: Not on file  Occupational History   Occupation: Retired    Fish farm manager: OTHER  Tobacco Use   Smoking status: Never   Smokeless tobacco: Never  Vaping Use   Vaping Use: Never used  Substance and Sexual Activity   Alcohol use: Yes    Alcohol/week: 7.0 standard drinks of alcohol    Types: 7 Glasses of wine per  week    Comment: red wine glass daily or shot of whiskey   Drug use: No   Sexual activity: Yes  Other Topics Concern   Not on file  Social History Narrative   Not on file   Social Determinants of Health   Financial Resource Strain: Not on file  Food Insecurity: Not on file  Transportation Needs: Not on file  Physical Activity: Not on file  Stress: Not on file  Social Connections: Not on file     Family History: The patient's family history includes Cancer in his sister; Heart attack in his father; Leukemia in his mother. There is no history of Hypertension, Stroke, Colon cancer, Esophageal cancer, Rectal cancer, or Stomach cancer.  ROS:   Please see the history of present illness.    All other systems reviewed and are negative.  EKGs/Labs/Other Studies Reviewed Today:     EKG:  Last EKG results: sinus rhythm, rate 61 bpm   Recent Labs: 05/14/2021: Magnesium 2.2; TSH 3.610 09/01/2021: ALT 17 11/17/2021: BUN 26; Creatinine, Ser 1.52; Hemoglobin 16.0; Platelets 285; Potassium 4.9; Sodium 137     Physical Exam:    VS:  BP 116/70   Pulse 61   Ht '5\' 7"'$  (1.702 m)   Wt 186 lb 12.8 oz (84.7 kg)   SpO2 95%   BMI 29.26 kg/m     Wt Readings from Last 3 Encounters:  12/28/21 186 lb 12.8 oz (84.7 kg)  11/20/21 190 lb  (86.2 kg)  11/17/21 192 lb 12.8 oz (87.5 kg)     GEN: Well nourished, well developed in no acute distress CARDIAC: RRR, no murmurs, rubs, gallops RESPIRATORY:  Normal work of breathing MUSCULOSKELETAL: no edema    ASSESSMENT & PLAN:    Paroxysmal atrial fibrillation: symptomatic. Events are frequently nocturnal. He is unfortunately not able to tolerate CPAP. Because he is symptomatic, I recommended rhythm control. At this time, I recommended initial therapy with medication due to his age. He cannot take flecainide due to CAD. We will try Tikosyn. I explained the anticipated benefits and possible risks, which include a very low but not zero risk of life threatening arrhythmia. He would like to proceed with drug load. Secondary hypercoagulable state: continue apixaban. History of failed wathman attempt CAD - history of NSTEMI      Medication Adjustments/Labs and Tests Ordered: Current medicines are reviewed at length with the patient today.  Concerns regarding medicines are outlined above.  No orders of the defined types were placed in this encounter.  No orders of the defined types were placed in this encounter.    Signed, Melida Quitter, MD  12/28/2021 11:59 AM    [place]

## 2021-12-28 NOTE — Patient Instructions (Signed)
Medication Instructions:  Your physician has recommended you make the following change in your medication:  1) You will be started on Tikosyn in the hospital  *If you need a refill on your cardiac medications before your next appointment, please call your pharmacy*  Follow-Up: At [facility], you and your health needs are our priority.  As part of our continuing mission to provide you with exceptional heart care, we have created designated Provider Care Teams.  These Care Teams include your primary Cardiologist (physician) and Advanced Practice Providers (APPs -  Physician Assistants and Nurse Practitioners) who all work together to provide you with the care you need, when you need it.  Your next appointment:   Stacy from the [facility] will be in touch with you to schedule your admission  Other Instructions Tikosyn (Dofetilide) Hospital Admission   Prior to day of admission:  Check with drug insurance company for cost of drug to ensure affordability --- Dofetilide 500 mcg twice a day.  GoodRx is an option if insurance copay is unaffordable.    No Benadryl is allowed 3 days prior to admission.   Please ensure no missed doses of your anticoagulation (blood thinner) for 3 weeks prior to admission. If a dose is missed please notify our office immediately.   A pharmacist will review all your medications for potential interactions with Tikosyn. If any medication changes are needed prior to admission we will be in touch with you.   If any new medications are started AFTER your admission date is set with Nurse, adult. Please notify our office immediately so your medication list can be updated and reviewed by our pharmacist again.  On day of admission:  Tikosyn initiation requires a 3 night/4 day hospital stay with constant telemetry monitoring. You will have an EKG after each dose of Tikosyn as well as daily lab draws.   If the drug does not convert you to normal rhythm a  cardioversion after the 4th dose of Tikosyn.   Afib Clinic office visit on the morning of admission is needed for preliminary labs/ekg.   Time of admission is dependent on bed availability in the hospital. In some instances, you will be sent home until bed is available. Rarely admission can be delayed to the following day if hospital census prevents available beds.   You may bring personal belongings/clothing with you to the hospital. Please leave your suitcase in the car until you arrive in admissions.   Questions please call our office at [place]

## 2021-12-30 ENCOUNTER — Telehealth (HOSPITAL_COMMUNITY): Payer: Self-pay | Admitting: *Deleted

## 2021-12-30 NOTE — Telephone Encounter (Signed)
Pt unsure if he would like to proceed with tikosyn. He will call with his decision.

## 2022-01-05 ENCOUNTER — Ambulatory Visit: Payer: Medicare Other | Admitting: Internal Medicine

## 2022-01-06 DIAGNOSIS — H903 Sensorineural hearing loss, bilateral: Secondary | ICD-10-CM | POA: Diagnosis not present

## 2022-01-07 ENCOUNTER — Telehealth (HOSPITAL_COMMUNITY): Payer: Self-pay | Admitting: *Deleted

## 2022-01-07 ENCOUNTER — Telehealth: Payer: Self-pay | Admitting: Cardiovascular Disease

## 2022-01-07 DIAGNOSIS — I480 Paroxysmal atrial fibrillation: Secondary | ICD-10-CM

## 2022-01-07 NOTE — Telephone Encounter (Signed)
Patient called in stating he has decided he does not want to proceed with tikosyn. The side effects "scare me and I'm not interested in taking medication the rest of my life."  He would prefer ablation. Reviewed recent office note with patient "Paroxysmal atrial fibrillation: symptomatic. Events are frequently nocturnal. He is unfortunately not able to tolerate CPAP. Because he is symptomatic, I recommended rhythm control. At this time, I recommended initial therapy with medication due to his age."  Informed patient I would forward his request to Dr. Karel Jarvis office for further management.

## 2022-01-07 NOTE — Telephone Encounter (Signed)
I called and spoke to the patient in response to his request to cancel the Tikosyn load and schedule an AF ablation.  I wanted to ensure we had discussed the logistics and risks of the procedure. He indicated that we had reviewed them briefly. I reiterated the indication, rationale, logistics, anticipated benefits, and potential risks of the ablation procedure including but not limited to -- bleed at the groin access site, chest pain, damage to nearby organs such as the diaphragm, lungs, or esophagus, need for a drainage tube, or prolonged hospitalization. I explained that the risk for stroke, heart attack, need for open chest surgery, or even death is very low but not zero. he  expressed understanding and wishes to proceed.   I think it is reasonable to schedule him for AF ablation.  > 10 minutes spent on this conversation.

## 2022-01-11 NOTE — Addendum Note (Signed)
Addended by: Bernestine Amass on: 01/11/2022 01:22 PM   Modules accepted: Orders

## 2022-01-26 ENCOUNTER — Ambulatory Visit (HOSPITAL_BASED_OUTPATIENT_CLINIC_OR_DEPARTMENT_OTHER): Payer: Medicare Other | Admitting: Internal Medicine

## 2022-01-26 ENCOUNTER — Encounter (HOSPITAL_BASED_OUTPATIENT_CLINIC_OR_DEPARTMENT_OTHER): Payer: Self-pay | Admitting: Internal Medicine

## 2022-01-26 VITALS — BP 112/82 | HR 70 | Ht 67.0 in | Wt 194.0 lb

## 2022-01-26 DIAGNOSIS — E785 Hyperlipidemia, unspecified: Secondary | ICD-10-CM

## 2022-01-26 DIAGNOSIS — I480 Paroxysmal atrial fibrillation: Secondary | ICD-10-CM

## 2022-01-26 DIAGNOSIS — J849 Interstitial pulmonary disease, unspecified: Secondary | ICD-10-CM | POA: Diagnosis not present

## 2022-01-26 DIAGNOSIS — I25110 Atherosclerotic heart disease of native coronary artery with unstable angina pectoris: Secondary | ICD-10-CM

## 2022-01-26 DIAGNOSIS — Z7901 Long term (current) use of anticoagulants: Secondary | ICD-10-CM

## 2022-01-26 NOTE — Patient Instructions (Signed)
Medication Instructions:  NO CHANGES  *If you need a refill on your cardiac medications before your next appointment, please call your pharmacy*   Follow-Up: At [facility], you and your health needs are our priority.  As part of our continuing mission to provide you with exceptional heart care, we have created designated Provider Care Teams.  These Care Teams include your primary Cardiologist (physician) and Advanced Practice Providers (APPs -  Physician Assistants and Nurse Practitioners) who all work together to provide you with the care you need, when you need it.  We recommend signing up for the patient portal called "MyChart".  Sign up information is provided on this After Visit Summary.  MyChart is used to connect with patients for Virtual Visits (Telemedicine).  Patients are able to view lab/test results, encounter notes, upcoming appointments, etc.  Non-urgent messages can be sent to your provider as well.   To learn more about what you can do with MyChart, go to [email or web address].    Your next appointment:   6 month(s)  The format for your next appointment:   In Person  Provider:   Kenneth C Hilty, MD   

## 2022-01-26 NOTE — Progress Notes (Signed)
OFFICE NOTE  Chief Complaint:  Follow-up  Primary Care Physician: Maury Dus, MD  HPI:  Charles Wright is a 86 y.o. male with a past medial history significant for coronary artery disease with non-STEMI in 2016.  He was found to have distal LAD disease not amenable to PCI as well as moderate mid to proximal LAD disease.  Medical therapy was recommended.  He also has a history of bladder cancer, prostate cancer, hyperlipidemia, hypertension and OSA on CPAP.  He has been followed by Dr. Wynonia Lawman.  He maintains on clopidogrel but not aspirin.  Recently he has been having issues with ureteral stricture and is recommended to have a cystoscopy with Dr. Junious Silk.  He would require clearance including holding clopidogrel prior to that procedure.  Today in the office he is coughing significantly.  He apparently had upper respiratory infection and then also had concomitant rash which was painful and vesicular and diagnosed as shingles by a dermatologist that he is friends with.  Besides his shortness of breath related upper respiratory infection as well as chest wall pain from shingles, he denies any chest pain concerning for angina or recent worsening shortness of breath.  10/10/2018  Charles Wright returns today for follow-up of coronary disease and atrial fibrillation.  He is a former patient of Dr. Wynonia Lawman however Dr. Wynonia Lawman is now retired.  When we last spoke he received preoperative clearance for urologic procedure.  He does have a history of a bladder cancer and hematuria.  Interestingly, based on further chart review, he had had a prior watchman procedure for his A. fib attempt at stroke reduction.  At that time because of bleeding he was not felt to be a candidate for anticoagulation for his A. fib.  He had been on amiodarone.  He was then referred for watchman procedure which shows ultimately unsuccessful.  I was involved in the imaging component of that procedure.  Subsequently it was recommended he go on  aspirin and Plavix since he was not a candidate for anticoagulation due to GU bleeding from bladder cancer.  That has however been successfully treated and has had no further bleeding issues.  He also just had recent ureteral stenting which seems to have been successful.  When reviewing his chart today however it it notes that he is only on clopidogrel monotherapy.  Have an STEMI in 2016 and was found to have significant coronary disease however not amenable to intervention.  He is done well with that without any further chest pain however did have significant fatigue recently.  It turned out this was related to low testosterone and he is now on testosterone replacement.  Likely a side effect of antiandrogen therapy.  Recent labs show total cholesterol 158, HDL 50 LDL, 86 and triglycerides 115.  05/02/2019  Charles Wright returns today for follow-up. He is complaining of some issues with lightheadedness. He recently underwent surgery for what was suspicious for a left upper tract urothelial carcinoma. This was complicated by pain and bladder spasms requiring multiple ER visits. He has had a decreased appetite and weight loss (5 pounds of which was his left kidney/ureter. Additionally, he is down a total of 20 pounds since I last saw him. This is pertinent because his blood pressure today was 90/60. In general it has run higher than this. I suspect it may be a reason why feels lightheaded. His EKG shows a sinus bradycardia at 55. Possible medications that could causes include his isosorbide and metoprolol which she  takes 50 mg twice daily.  08/21/2019  Charles Wright seen today in follow-up.  Overall he continues to do well.  He says since he had his nephrectomies had no further bleeding issues.  He denies any chest pain or worsening shortness of breath.  Recently had some orthostatic hypotension with low blood pressure noted to be in the 34V systolic.  He was taken off of his Imdur and his beta-blocker was decreased.   Blood pressure today was 116/72 and his mention he feels well.  01/17/2020  Charles Wright is seen today for follow-up.  Over the summer he had chest pain and was seen by Almyra Deforest, PA-C.  Stress test was performed which showed small fixed apical defect without any ischemia, consistent with known distal LAD severe stenosis.  This vessel was thought to be too small to intervene on.  No other reversible ischemia was appreciated.  He noted that his chest pain was not improved with nitroglycerin.  During his exam he was also found to have bibasilar dry crackles consistent with possible interstitial lung disease.  High-resolution CT scan was performed which did corroborate this.  He has subsequently been referred to pulmonary and saw Dr. Vaughan Browner, who feels he may have IPF.  He has pending pulmonary function tests and follow-up with him in January.  He reports he still gets some intermittent chest discomfort in the left upper chest.  04/18/2020  Charles Wright returns for follow-up.  He continues to have work-up of pulmonary fibrosis.  He remains on low-dose Eliquis 2.5 mg twice daily.  Heart rate is in the 60s today.  EKG shows sinus rhythm with frequent PVCs in a quadrigeminal pattern.  He says he is really unaware of any extrasystoles or palpitations.  Weight is down about 6 pounds since we last saw him.  04/21/2021  Charles Wright returns today for follow-up.  He had called the office last year about concerns of palpitations, dizziness and fatigue.  This has been going on and off for the last year.  I was concerned that he might be having recurrent atrial fibrillation or perhaps PVCs.  We asked him to come to the office for an appointment but have not seen him until today.  EKG today does demonstrate frequent PVCs these in fact ventricular bigeminy.  If he has been in this for a good period of time he may have developed some cardiomyopathy.  11/17/2021  Charles Wright returns today for follow-up.  He continues to have episodes  of dizziness, fatigue and progressive dyspnea on exertion.  He was seen in April by Laurann Montana, NP and noted to be having more palpitations.  She had proposed increasing his metoprolol.  He wore a monitor which showed some recurrent atrial fibrillation/flutter and SVT with a low burden of about 3%.  His metoprolol was then increased up to 50 mg twice daily.  This provided perhaps a little bit of benefit however subsequently he has become worse.  His energy level is poor.  He is quite fatigued and gets short of breath easily.  He is now waking up in the middle the night with palpitations.  He denies any chest pain.  Of note he had a cardiac catheterization back in 2016 which showed 99% stenosis of the distal LAD at the time he had NSTEMI as well as 50 to 60% stenosis of the mid LAD.  My concern is that he has had progression of this coronary disease.  01/26/2022  Charles Wright underwent recent left  heart catheterization in October.  This showed stable coronary artery disease with stable apical LAD stenosis which was severe as well as nonobstructive proximal and mid LAD plaquing, angiographically improved from his last cath.  No intervention was undertaken.  Subsequently he saw Dr. Myles Gip with cardiac EP for options for his atrial fibrillation.  They discussed the possibility of using dofetilide but he was concerned about side effects.  Ultimately he agreed to catheter ablation.  This is scheduled for February.  PMHx:  Past Medical History:  Diagnosis Date   Acute gout of left ankle    06-14-2015   Bladder cancer (HCC)    BCG's tx's   BPH (benign prostatic hypertrophy)    CAD (coronary artery disease) CARDIOLOGIST-  DR TILLEY   a. NSTEMI 12/2014 -  99% dLAD-2 (not amenable to PCI), 50% dLAD-1, 60% mLAD. Medical therapy was recommended.   Cancer of kidney (HCC)    left   Cough    HARSH NONPRODUCTIVE COUGH 1 AND 1/2 WEEKS   GERD (gastroesophageal reflux disease)    takes  OTC periodically    Gilbert's syndrome    H/O cardiac catheterization    a. Note: Difficult radial access in 12/2014 - recommend femoral approach if cath needed in the future.   History of kidney stones    History of non-ST elevation myocardial infarction (NSTEMI)    12-12-2014   CARDIAC CATH W/ NO INTERVENTION X 2FEW DAYS APART   History of spinal fracture 2002   LUMBAR   Hyperlipidemia    Hypertension    Hypothyroidism    Myocardial infarction (HCC)    OSA on CPAP    SET ON 7 USES SOME NIGHTS   Paroxysmal A-fib [facility])    cardiologist-  dr Wynonia Lawman   Prostate cancer [facility]) 2019   last PSA  2.9  (montiored by urologist  dr Junious Silk) [place] FEB 2019 RAD DONE   Shingles    2 weeks ago   Wears glasses     Past Surgical History:  Procedure Laterality Date   BACK SURGERY  2014   LOWER l 1, 2 4 AND 5   BILATERAL INGUINAL HERNIA REPAIR     CARDIAC CATHETERIZATION N/A 12/13/2014   Procedure: Left Heart Cath and Coronary Angiography;  Surgeon: Leonie Man, MD;  Location: [place] [facility];  Service: Cardiovascular;  Laterality: N/A;  culprit lesion diat LAD-2  99%, not approachable via PCTA  due to extremely tortuous up stream LAD/  mLAD 60% and dLAD-1 50%, both are at the extremely tortuous segment and not PCI targets/  otherwise normal coronary arteries   COLONOSCOPY WITH PROPOFOL N/A 01/16/2019   Procedure: COLONOSCOPY WITH PROPOFOL;  Surgeon: Clarene Essex, MD;  [facility]: [facility];  Service: Endoscopy;  Laterality: N/A;   CYSTOSCOPY W/ RETROGRADES Left 08/22/2012   Procedure: CYSTOSCOPY WITH RETROGRADE PYELOGRAM;  left kidney washings;  Surgeon: Fredricka Bonine, MD;  Location: [facility];  Service: Urology;  Laterality: Left;   CYSTOSCOPY W/ RETROGRADES N/A 07/08/2015   Procedure: CYSTOSCOPY WITH RETROGRADE PYELOGRAM;  Surgeon: [name], MD;  Location: [facility];  Service: Urology;  Laterality: N/A;   CYSTOSCOPY W/ URETERAL STENT PLACEMENT Left 03/24/2019    Procedure: CYSTOSCOPY WITH RETROGRADE PYELOGRAM/URETERAL STENT PLACEMENT ureteroscopy biopsy of neoplasm;  Surgeon: [name], MD;  Location: [facility];  Service: Urology;  Laterality: Left;   CYSTOSCOPY W/ URETERAL STENT PLACEMENT Left 04/08/2019   Procedure: CYSTOSCOPY cystogram clot evacuation left ureteroscopy clot  evacuation left stent exchange liimited transuretheral resectio of prastate and fulguration  bleeders of prostate;  Surgeon: [name], MD;  Location: [facility];  Service: Urology;  Laterality: Left;   CYSTOSCOPY WITH BIOPSY  08/22/2012   Procedure: CYSTOSCOPY WITH BIOPSY;  Surgeon: Fredricka Bonine, MD;  Location: [facility];  Service: Urology;;   CYSTOSCOPY WITH BIOPSY N/A 11/08/2014   Procedure: CYSTOSCOPY/BIOPSPY BLADDER INSTILLATION OF MARCAINE AND PYRIDIUM;  Surgeon: [name], MD;  Location: [facility];  Service: Urology;  Laterality: N/A;   CYSTOSCOPY WITH BIOPSY N/A 07/08/2015   Procedure: CYSTOSCOPY WITH BLADDER BIOPSY, FULGURATION, RETROGRADE PYLOGRAM AND RENAL WASHING;  Surgeon: [name], MD;  Location: [facility];  Service: Urology;  Laterality: N/A;   CYSTOSCOPY WITH RETROGRADE PYELOGRAM, URETEROSCOPY AND STENT PLACEMENT Bilateral 07/30/2014   Procedure: CYSTOSCOPY WITH BLADDER BX FULERGATION AND BILATERAL RETROGRADE PYELOGRAM,;  Surgeon: [name], MD;  [facility];  Service: Urology;  Laterality: Bilateral;   CYSTOSCOPY WITH STENT PLACEMENT Left 03/24/2018   Procedure: STENT PLACEMENT AND BIOPSY;  Surgeon: [name], MD;  Location: [facility];  Service: Urology;  Laterality: Left;   CYSTOSCOPY/RETROGRADE/URETEROSCOPY Bilateral 08/17/2013   Procedure: CYSTOSCOPY, BLADDER BIOPSY WITH BILATERAL RETROGRADE PYELOGRAM, LEFT URETEROSCOPY AND DILATION OF STRICTURE ;  Surgeon: [name], MD;  Location: [facility];  Service: Urology;  Laterality:  Bilateral;   CYSTOSCOPY/RETROGRADE/URETEROSCOPY Left 03/24/2018   Procedure: CYSTOSCOPY/RETROGRADE/URETEROSCOPY;  Surgeon: [name], MD;  Location: [facility];  Service: Urology;  Laterality: Left;   HOT HEMOSTASIS N/A 01/16/2019   Procedure: HOT HEMOSTASIS (ARGON PLASMA COAGULATION/BICAP);  Surgeon: Clarene Essex, MD;  Location: [facility];  Service: Endoscopy;  Laterality: N/A;   INSERTION OF MESH N/A 08/20/2020   Procedure: INSERTION OF MESH;  Surgeon: Ralene Ok, MD;  Location: [place];  Service: General;  Laterality: N/A;   LEFT ATRIAL APPENDAGE OCCLUSION N/A 01/09/2015   Procedure: LEFT ATRIAL APPENDAGE OCCLUSION;  Surgeon: [name], MD;  Location: [place] [facility];  Service: Cardiovascular;  Laterality: N/A;   LEFT HEART CATH AND CORONARY ANGIOGRAPHY N/A 11/20/2021   Procedure: LEFT HEART CATH AND CORONARY ANGIOGRAPHY;  Surgeon: Sherren Mocha, MD;  Location: [place] [facility];  Service: Cardiovascular;  Laterality: N/A;   LUMBAR LAMINECTOMY/DECOMPRESSION MICRODISCECTOMY Left 12/03/2013   Procedure: Left Lumbar three-four diskectomy with Lumbar four laminectomy ;  Surgeon: Newman Pies, MD;  Location: [place] [facility];  Service: Neurosurgery;  Laterality: Left;  Left Lumbar three-four diskectomy with Lumbar four laminectomy    LYMPH NODE DISSECTION Bilateral 04/13/2019   Procedure: LYMPH NODE DISSECTION;  Surgeon: Alexis Frock, MD;  [facility];  Service: Urology;  Laterality: Bilateral;   ORIF LEFT ANKLE FX  2005   POLYPECTOMY  01/16/2019   Procedure: POLYPECTOMY;  Surgeon: Clarene Essex, MD;  [facility];  Service: Endoscopy;;   PROSTATE BIOPSY N/A 08/22/2012   Procedure: BIOPSY TRANSRECTAL ULTRASONIC PROSTATE (TUBP);  Surgeon: Fredricka Bonine, MD;  [facility]  Service: Urology;  Laterality: N/A;   ROBOT ASSITED LAPAROSCOPIC NEPHROURETERECTOMY Left 04/13/2019   Procedure: XI ROBOT ASSITED LAPAROSCOPIC  NEPHROURETERECTOMY;  Surgeon: Alexis Frock, MD;  [facility];  Service: Urology;  Laterality: Left;  3 HRS   TEE WITHOUT CARDIOVERSION N/A 12/31/2014   Procedure: TRANSESOPHAGEAL ECHOCARDIOGRAM (TEE);  Surgeon: Pixie Casino, MD;  Location: [facility];  Service: Cardiovascular;  Laterality: N/A;  ef 55-60%/  mild MR, TR, and PR/  mild  LAE and RAE   TRANSURETHRAL RESECTION OF BLADDER TUMOR  10/22/2011   Procedure: TRANSURETHRAL RESECTION OF BLADDER TUMOR (TURBT);  Surgeon: Fredricka Bonine, MD;  [facility];  Service: Urology;  Laterality: N/A;  TURBT, LEFT URETEROSCOPY, POSSIBLE URETERAL STENT    URETEROSCOPY  10/22/2011   Procedure: URETEROSCOPY;  Surgeon: Fredricka Bonine, MD;  Location: [facility];  Service: Urology;  Laterality: Left;   WISDOM TOOTH EXTRACTION     XI ROBOTIC ASSISTED VENTRAL HERNIA N/A 08/20/2020   Procedure: ROBOTIC INCISIONAL HERNIA REPAIR WITH MESH;  Surgeon: Ralene Ok, MD;  Location: [place];  Service: General;  Laterality: N/A;    FAMHx:  Family History  Problem Relation Age of Onset   Leukemia Mother    Heart attack Father    Cancer Sister        breast   Hypertension Neg Hx    Stroke Neg Hx    Colon cancer Neg Hx    Esophageal cancer Neg Hx    Rectal cancer Neg Hx    Stomach cancer Neg Hx     SOCHx:   reports that he has never smoked. He has never used smokeless tobacco. He reports current alcohol use of about 7.0 standard drinks of alcohol per week. He reports that he does not use drugs.  ALLERGIES:  Allergies  Allergen Reactions   Bee Venom Anaphylaxis    Actually Hornets and Wasps.    Losartan Cough        Oxycodone Other (See Comments)    ALTERED MENTAL STATUS GETS MEAN    ROS: Pertinent items noted in HPI and remainder of comprehensive ROS otherwise negative.  HOME MEDS: Current Outpatient Medications on File Prior to Visit  Medication Sig Dispense Refill    acetaminophen (TYLENOL) 500 MG tablet Take 1,000 mg by mouth every 6 (six) hours as needed for moderate pain or headache.     allopurinol (ZYLOPRIM) 300 MG tablet Take 300 mg by mouth every morning.      apixaban (ELIQUIS) 2.5 MG TABS tablet TAKE ONE TABLET BY MOUTH TWICE A DAY 180 tablet 1   aspirin EC 81 MG tablet Take 81 mg by mouth daily.     Carboxymethylcellul-Glycerin (REFRESH RELIEVA OP) Place 1 drop into both eyes daily as needed (dry eyes).     Cholecalciferol (DIALYVITE VITAMIN D 5000) 125 MCG (5000 UT) capsule Take 5,000 Units by mouth daily.     doxycycline (MONODOX) 50 MG capsule Take 50 mg by mouth daily as needed (rosacea).     EPINEPHrine 0.3 mg/0.3 mL IJ SOAJ injection Inject 0.3 mLs (0.3 mg total) into the muscle once. (Patient taking differently: Inject 0.3 mg into the muscle as needed for anaphylaxis.) 2 Device 1   GLUCOSAMINE SULFATE PO Take 750 mg by mouth daily.     levothyroxine (SYNTHROID, LEVOTHROID) 50 MCG tablet Take 50 mcg by mouth daily before breakfast.     Multiple Vitamin (MULTIVITAMIN PO) Take 1 tablet by mouth daily.     nitroGLYCERIN (NITROSTAT) 0.4 MG SL tablet Place 1 tablet (0.4 mg total) under the tongue every 5 (five) minutes x 3 doses as needed for chest pain. 25 tablet 12   Testosterone 1.62 % GEL Apply 2 Pump topically every other day.      metoprolol tartrate (LOPRESSOR) 50 MG tablet Take 1 tablet (50 mg total) by mouth 2 (two) times daily. Take additional half tablet as needed for for palpitations. 180 tablet 3  rosuvastatin (CRESTOR) 20 MG tablet Take 1 tablet (20 mg total) by mouth daily. 90 tablet 3   No current facility-administered medications on file prior to visit.    LABS/IMAGING: No results found for this or any previous visit (from the past 48 hour(s)). No results found.  LIPID PANEL:    Component Value Date/Time   CHOL 111 09/01/2021 0947   TRIG 90 09/01/2021 0947   HDL 38 (L) 09/01/2021 0947   CHOLHDL 2.9 09/01/2021 0947    CHOLHDL 4.4 12/12/2014 1721   VLDL 18 12/12/2014 1721   LDLCALC 55 09/01/2021 0947   LDLDIRECT 152 (H) 05/14/2021 1428     WEIGHTS: Wt Readings from Last 3 Encounters:  01/26/22 194 lb (88 kg)  12/28/21 186 lb 12.8 oz (84.7 kg)  11/20/21 190 lb (86.2 kg)    VITALS: BP 112/82   Pulse 70   Ht '5\' 7"'$  (1.702 m)   Wt 194 lb (88 kg)   BMI 30.38 kg/m   EXAM: General appearance: alert and no distress Neck: no carotid bruit, no JVD and thyroid not enlarged, symmetric, no tenderness/mass/nodules Lungs: rales bibasilar Heart: regular rate and rhythm Abdomen: soft, non-tender; bowel sounds normal; no masses,  no organomegaly and obese Extremities: extremities normal, atraumatic, no cyanosis or edema Pulses: 2+ and symmetric Skin: Skin color, texture, turgor normal. No rashes or lesions Neurologic: Grossly normal : Pleasant  EKG: Deferred  ASSESSMENT: Dizziness, fatigue, progressive dyspnea -probably related to A-fib History of NSTEMI - found to have multivessel coronary artery disease, including 99% LAD distal stenosis (not amenable to PCI) and 50 to 60% mid LAD stenosis of a tortuous vessel (12/2014), without angina History of bladder CA with hematuria, resolved Failed Watchman LAA procedure in 2016 History of hypertensive heart disease PAF - CHADVASC score of 5 Obesity Hyperlipidemia OSA on CPAP History of TIA/stroke Hypothyroidism Possible left upper tract urothelial carcinoma-status post resection and radical left nephro ureterectomy. Interstitial lung disease -multiple COVID-19 infections Frequent PVCs -ventricular bigeminy  PLAN: 1.   Mr. Johnson had dizziness, fatigue and some progressive dyspnea, probably related to A-fib.  He says he feels great however today.  Because of intermittent symptomatic atrial fibrillation, I do think that A-fib ablation is a very reasonable option.  He already is on higher dose metoprolol.  His coronary disease seems to be stable.  He is on  low-dose Eliquis and aspirin.  He does have a history of TIA and stroke.  He had previously been evaluated for and had a failed Watchman procedure.  I think this is not really a viable option to try again even though the device has been redesigned.  We did discuss it and he would likely have to talk with Dr. Quentin Ore about it if he is interested.  Plan now, however is A-fib ablation with Dr. Myles Gip in February.  Follow-up with me in 6 months.  Pixie Casino, MD, [facility], [place] Director of the [facility] Lipid Disorders &  Cardiovascular Risk Reduction Clinic Diplomate of the American Board of Clinical Lipidology Attending Cardiologist  Direct Dial: [phone]  Fax: [phone]  Website:  [email or web address] 01/26/2022, 4:00 PM

## 2022-02-03 DIAGNOSIS — Z23 Encounter for immunization: Secondary | ICD-10-CM | POA: Diagnosis not present

## 2022-02-03 DIAGNOSIS — M545 Low back pain, unspecified: Secondary | ICD-10-CM | POA: Diagnosis not present

## 2022-02-05 ENCOUNTER — Other Ambulatory Visit: Payer: Self-pay | Admitting: Internal Medicine

## 2022-02-05 DIAGNOSIS — I480 Paroxysmal atrial fibrillation: Secondary | ICD-10-CM

## 2022-02-05 NOTE — Telephone Encounter (Signed)
Prescription refill request for Eliquis received. Indication:afib Last office visit:12/23 Scr:1.5 Age: 86 Weight:88 kg  Prescription refilled

## 2022-02-16 DIAGNOSIS — M5431 Sciatica, right side: Secondary | ICD-10-CM | POA: Diagnosis not present

## 2022-02-23 DIAGNOSIS — M5136 Other intervertebral disc degeneration, lumbar region: Secondary | ICD-10-CM | POA: Diagnosis not present

## 2022-02-23 DIAGNOSIS — M5431 Sciatica, right side: Secondary | ICD-10-CM | POA: Diagnosis not present

## 2022-02-26 DIAGNOSIS — L570 Actinic keratosis: Secondary | ICD-10-CM | POA: Diagnosis not present

## 2022-02-26 DIAGNOSIS — Z08 Encounter for follow-up examination after completed treatment for malignant neoplasm: Secondary | ICD-10-CM | POA: Diagnosis not present

## 2022-02-26 DIAGNOSIS — D485 Neoplasm of uncertain behavior of skin: Secondary | ICD-10-CM | POA: Diagnosis not present

## 2022-02-26 DIAGNOSIS — L814 Other melanin hyperpigmentation: Secondary | ICD-10-CM | POA: Diagnosis not present

## 2022-02-26 DIAGNOSIS — D225 Melanocytic nevi of trunk: Secondary | ICD-10-CM | POA: Diagnosis not present

## 2022-02-26 DIAGNOSIS — D2262 Melanocytic nevi of left upper limb, including shoulder: Secondary | ICD-10-CM | POA: Diagnosis not present

## 2022-02-26 DIAGNOSIS — L821 Other seborrheic keratosis: Secondary | ICD-10-CM | POA: Diagnosis not present

## 2022-03-09 DIAGNOSIS — K080 Exfoliation of teeth due to systemic causes: Secondary | ICD-10-CM | POA: Diagnosis not present

## 2022-03-10 DIAGNOSIS — Z7901 Long term (current) use of anticoagulants: Secondary | ICD-10-CM | POA: Diagnosis not present

## 2022-03-10 DIAGNOSIS — Z905 Acquired absence of kidney: Secondary | ICD-10-CM | POA: Diagnosis not present

## 2022-03-10 DIAGNOSIS — Z888 Allergy status to other drugs, medicaments and biological substances status: Secondary | ICD-10-CM | POA: Diagnosis not present

## 2022-03-10 DIAGNOSIS — H903 Sensorineural hearing loss, bilateral: Secondary | ICD-10-CM | POA: Diagnosis not present

## 2022-03-10 DIAGNOSIS — Z885 Allergy status to narcotic agent status: Secondary | ICD-10-CM | POA: Diagnosis not present

## 2022-03-10 DIAGNOSIS — Z974 Presence of external hearing-aid: Secondary | ICD-10-CM | POA: Diagnosis not present

## 2022-03-10 DIAGNOSIS — C679 Malignant neoplasm of bladder, unspecified: Secondary | ICD-10-CM | POA: Diagnosis not present

## 2022-03-10 DIAGNOSIS — C61 Malignant neoplasm of prostate: Secondary | ICD-10-CM | POA: Diagnosis not present

## 2022-03-10 DIAGNOSIS — I4891 Unspecified atrial fibrillation: Secondary | ICD-10-CM | POA: Diagnosis not present

## 2022-03-11 ENCOUNTER — Ambulatory Visit: Payer: Medicare Other | Attending: Cardiovascular Disease

## 2022-03-11 DIAGNOSIS — I480 Paroxysmal atrial fibrillation: Secondary | ICD-10-CM

## 2022-03-11 LAB — CBC WITH DIFFERENTIAL/PLATELET

## 2022-03-12 LAB — CBC WITH DIFFERENTIAL/PLATELET
Basophils Absolute: 0.1 10*3/uL (ref 0.0–0.2)
Basos: 1 %
EOS (ABSOLUTE): 0.3 10*3/uL (ref 0.0–0.4)
Eos: 3 %
Hematocrit: 47.7 % (ref 37.5–51.0)
Hemoglobin: 16.3 g/dL (ref 13.0–17.7)
Immature Grans (Abs): 0.1 10*3/uL (ref 0.0–0.1)
Immature Granulocytes: 1 %
Lymphocytes Absolute: 1.6 10*3/uL (ref 0.7–3.1)
Lymphs: 19 %
MCH: 30.4 pg (ref 26.6–33.0)
MCHC: 34.2 g/dL (ref 31.5–35.7)
MCV: 89 fL (ref 79–97)
Monocytes Absolute: 1.2 10*3/uL — ABNORMAL HIGH (ref 0.1–0.9)
Monocytes: 13 %
Neutrophils Absolute: 5.5 10*3/uL (ref 1.4–7.0)
Neutrophils: 63 %
Platelets: 264 10*3/uL (ref 150–450)
RBC: 5.37 x10E6/uL (ref 4.14–5.80)
RDW: 13.1 % (ref 11.6–15.4)
WBC: 8.6 10*3/uL (ref 3.4–10.8)

## 2022-03-12 LAB — BASIC METABOLIC PANEL
BUN/Creatinine Ratio: 14 (ref 10–24)
BUN: 19 mg/dL (ref 8–27)
CO2: 25 mmol/L (ref 20–29)
Calcium: 10.1 mg/dL (ref 8.6–10.2)
Chloride: 102 mmol/L (ref 96–106)
Creatinine, Ser: 1.35 mg/dL — ABNORMAL HIGH (ref 0.76–1.27)
Glucose: 100 mg/dL — ABNORMAL HIGH (ref 70–99)
Potassium: 5.3 mmol/L — ABNORMAL HIGH (ref 3.5–5.2)
Sodium: 140 mmol/L (ref 134–144)
eGFR: 52 mL/min/{1.73_m2} — ABNORMAL LOW (ref >59)

## 2022-03-15 DIAGNOSIS — H903 Sensorineural hearing loss, bilateral: Secondary | ICD-10-CM | POA: Diagnosis not present

## 2022-03-25 ENCOUNTER — Telehealth (HOSPITAL_COMMUNITY): Payer: Self-pay | Admitting: Emergency Medicine

## 2022-03-25 NOTE — Telephone Encounter (Signed)
Attempted to call patient regarding upcoming cardiac CT appointment. °Left message on voicemail with name and callback number °Nayomi Tabron RN Navigator Cardiac Imaging °[facility] °[phone] Office °[phone] Cell ° °

## 2022-03-26 ENCOUNTER — Ambulatory Visit (HOSPITAL_BASED_OUTPATIENT_CLINIC_OR_DEPARTMENT_OTHER)
Admission: RE | Admit: 2022-03-26 | Discharge: 2022-03-26 | Disposition: A | Discharge status: Home / Self Care | Payer: Medicare Other | Source: Ambulatory Visit | Attending: Cardiovascular Disease | Admitting: Cardiovascular Disease

## 2022-03-26 DIAGNOSIS — I480 Paroxysmal atrial fibrillation: Secondary | ICD-10-CM | POA: Insufficient documentation

## 2022-03-26 MED ORDER — IOHEXOL 350 MG/ML SOLN
100.0000 mL | Freq: Once | INTRAVENOUS | Status: AC | PRN
  Administered 2022-03-26: 80 mL via INTRAVENOUS

## 2022-03-31 NOTE — Pre-Procedure Instructions (Addendum)
Instructed patient on the following items: Arrival time 0830 Nothing to eat or drink after midnight No meds AM of procedure Responsible person to drive you home and stay with you for 24 hrs Wash with special soap night before and morning of procedure If on anti-coagulant drug instructions Eliquis- patient states he stop his eliquis on Sunday, he thought that was what the instructions said.   Will discuss with Dr Myles Gip and call the patient back.  1720- Will do a TEE on EP table per Dr Myles Gip.  Patient is aware.

## 2022-04-01 ENCOUNTER — Ambulatory Visit (HOSPITAL_COMMUNITY)
Admission: RE | Admit: 2022-04-01 | Discharge: 2022-04-01 | Disposition: A | Discharge status: Home / Self Care | Payer: Medicare Other | Source: Ambulatory Visit | Attending: Cardiovascular Disease | Admitting: Cardiovascular Disease

## 2022-04-01 ENCOUNTER — Other Ambulatory Visit (HOSPITAL_COMMUNITY): Payer: Self-pay

## 2022-04-01 ENCOUNTER — Telehealth: Payer: Self-pay | Admitting: Student in an Organized Health Care Education/Training Program

## 2022-04-01 ENCOUNTER — Ambulatory Visit (HOSPITAL_COMMUNITY): Payer: Medicare Other | Admitting: Anesthesiology

## 2022-04-01 ENCOUNTER — Other Ambulatory Visit: Payer: Self-pay

## 2022-04-01 ENCOUNTER — Encounter (HOSPITAL_COMMUNITY)
Admission: RE | Disposition: A | Discharge status: Home / Self Care | Payer: Self-pay | Source: Ambulatory Visit | Attending: Cardiovascular Disease

## 2022-04-01 ENCOUNTER — Ambulatory Visit (HOSPITAL_BASED_OUTPATIENT_CLINIC_OR_DEPARTMENT_OTHER): Payer: Medicare Other | Admitting: Anesthesiology

## 2022-04-01 ENCOUNTER — Ambulatory Visit (HOSPITAL_BASED_OUTPATIENT_CLINIC_OR_DEPARTMENT_OTHER): Payer: Medicare Other

## 2022-04-01 ENCOUNTER — Encounter (HOSPITAL_COMMUNITY): Payer: Self-pay | Admitting: Cardiovascular Disease

## 2022-04-01 DIAGNOSIS — E785 Hyperlipidemia, unspecified: Secondary | ICD-10-CM | POA: Diagnosis not present

## 2022-04-01 DIAGNOSIS — G4733 Obstructive sleep apnea (adult) (pediatric): Secondary | ICD-10-CM | POA: Insufficient documentation

## 2022-04-01 DIAGNOSIS — Z8546 Personal history of malignant neoplasm of prostate: Secondary | ICD-10-CM | POA: Insufficient documentation

## 2022-04-01 DIAGNOSIS — I480 Paroxysmal atrial fibrillation: Secondary | ICD-10-CM | POA: Diagnosis not present

## 2022-04-01 DIAGNOSIS — I340 Nonrheumatic mitral (valve) insufficiency: Secondary | ICD-10-CM

## 2022-04-01 DIAGNOSIS — I2510 Atherosclerotic heart disease of native coronary artery without angina pectoris: Secondary | ICD-10-CM

## 2022-04-01 DIAGNOSIS — I252 Old myocardial infarction: Secondary | ICD-10-CM | POA: Diagnosis not present

## 2022-04-01 DIAGNOSIS — Q2112 Patent foramen ovale: Secondary | ICD-10-CM | POA: Insufficient documentation

## 2022-04-01 DIAGNOSIS — I4891 Unspecified atrial fibrillation: Secondary | ICD-10-CM

## 2022-04-01 DIAGNOSIS — I351 Nonrheumatic aortic (valve) insufficiency: Secondary | ICD-10-CM

## 2022-04-01 DIAGNOSIS — I10 Essential (primary) hypertension: Secondary | ICD-10-CM | POA: Insufficient documentation

## 2022-04-01 DIAGNOSIS — I080 Rheumatic disorders of both mitral and aortic valves: Secondary | ICD-10-CM | POA: Diagnosis not present

## 2022-04-01 DIAGNOSIS — G4730 Sleep apnea, unspecified: Secondary | ICD-10-CM

## 2022-04-01 DIAGNOSIS — E039 Hypothyroidism, unspecified: Secondary | ICD-10-CM | POA: Insufficient documentation

## 2022-04-01 DIAGNOSIS — Z8551 Personal history of malignant neoplasm of bladder: Secondary | ICD-10-CM | POA: Diagnosis not present

## 2022-04-01 DIAGNOSIS — D6869 Other thrombophilia: Secondary | ICD-10-CM | POA: Diagnosis not present

## 2022-04-01 DIAGNOSIS — Z7901 Long term (current) use of anticoagulants: Secondary | ICD-10-CM | POA: Diagnosis not present

## 2022-04-01 HISTORY — PX: TEE WITHOUT CARDIOVERSION: SHX5443

## 2022-04-01 HISTORY — PX: ATRIAL FIBRILLATION ABLATION: EP1191

## 2022-04-01 LAB — POCT ACTIVATED CLOTTING TIME
Activated Clotting Time: 298 seconds
Activated Clotting Time: 309 seconds

## 2022-04-01 LAB — ECHO TEE

## 2022-04-01 SURGERY — ATRIAL FIBRILLATION ABLATION
Anesthesia: General

## 2022-04-01 MED ORDER — ONDANSETRON HCL 4 MG/2ML IJ SOLN
INTRAMUSCULAR | Status: DC | PRN
  Administered 2022-04-01: 4 mg via INTRAVENOUS

## 2022-04-01 MED ORDER — HEPARIN SODIUM (PORCINE) 1000 UNIT/ML IJ SOLN
INTRAMUSCULAR | Status: AC
  Filled 2022-04-01: qty 10

## 2022-04-01 MED ORDER — SODIUM CHLORIDE 0.9% FLUSH: 3.0000 mL | Freq: Two times a day (BID) | INTRAVENOUS | Status: DC

## 2022-04-01 MED ORDER — DOBUTAMINE INFUSION FOR EP/ECHO/NUC (1000 MCG/ML)
INTRAVENOUS | Status: DC | PRN
  Administered 2022-04-01: 20 ug/kg/min via INTRAVENOUS

## 2022-04-01 MED ORDER — HEPARIN (PORCINE) IN NACL 1000-0.9 UT/500ML-% IV SOLN
INTRAVENOUS | Status: DC | PRN
  Administered 2022-04-01 (×4): 500 mL

## 2022-04-01 MED ORDER — DOBUTAMINE INFUSION FOR EP/ECHO/NUC (1000 MCG/ML)
INTRAVENOUS | Status: AC
  Filled 2022-04-01: qty 250

## 2022-04-01 MED ORDER — PHENYLEPHRINE HCL-NACL 20-0.9 MG/250ML-% IV SOLN
INTRAVENOUS | Status: DC | PRN
  Administered 2022-04-01: 35 ug/min via INTRAVENOUS

## 2022-04-01 MED ORDER — SODIUM CHLORIDE 0.9 % IV SOLN: INTRAVENOUS | Status: DC

## 2022-04-01 MED ORDER — SODIUM CHLORIDE 0.9 % IV SOLN: 250.0000 mL | INTRAVENOUS | Status: DC | PRN

## 2022-04-01 MED ORDER — SUGAMMADEX SODIUM 200 MG/2ML IV SOLN
INTRAVENOUS | Status: DC | PRN
  Administered 2022-04-01: 400 mg via INTRAVENOUS

## 2022-04-01 MED ORDER — FENTANYL CITRATE (PF) 100 MCG/2ML IJ SOLN
INTRAMUSCULAR | Status: AC
  Filled 2022-04-01: qty 2

## 2022-04-01 MED ORDER — COLCHICINE 0.6 MG PO TABS
0.6000 mg | ORAL_TABLET | Freq: Two times a day (BID) | ORAL | 0 refills | Status: DC
  Filled 2022-04-01: qty 10, 5d supply, fill #0

## 2022-04-01 MED ORDER — PHENYLEPHRINE 80 MCG/ML (10ML) SYRINGE FOR IV PUSH (FOR BLOOD PRESSURE SUPPORT)
PREFILLED_SYRINGE | INTRAVENOUS | Status: DC | PRN
  Administered 2022-04-01: 240 ug via INTRAVENOUS

## 2022-04-01 MED ORDER — LIDOCAINE VISCOUS HCL 2 % MT SOLN
15.0000 mL | OROMUCOSAL | Status: DC | PRN
  Administered 2022-04-01: 15 mL via OROMUCOSAL
  Filled 2022-04-01 (×2): qty 15

## 2022-04-01 MED ORDER — FENTANYL CITRATE (PF) 250 MCG/5ML IJ SOLN
INTRAMUSCULAR | Status: DC | PRN
  Administered 2022-04-01: 100 ug via INTRAVENOUS

## 2022-04-01 MED ORDER — DEXAMETHASONE SODIUM PHOSPHATE 10 MG/ML IJ SOLN
INTRAMUSCULAR | Status: DC | PRN
  Administered 2022-04-01: 8 mg via INTRAVENOUS

## 2022-04-01 MED ORDER — MENTHOL 3 MG MT LOZG
1.0000 | LOZENGE | OROMUCOSAL | Status: DC | PRN
  Administered 2022-04-01: 3 mg via ORAL

## 2022-04-01 MED ORDER — HEPARIN SODIUM (PORCINE) 1000 UNIT/ML IJ SOLN
INTRAMUSCULAR | Status: DC | PRN
  Administered 2022-04-01: 1000 [IU] via INTRAVENOUS

## 2022-04-01 MED ORDER — ROCURONIUM BROMIDE 10 MG/ML (PF) SYRINGE
PREFILLED_SYRINGE | INTRAVENOUS | Status: DC | PRN
  Administered 2022-04-01: 10 mg via INTRAVENOUS
  Administered 2022-04-01: 70 mg via INTRAVENOUS

## 2022-04-01 MED ORDER — HEPARIN SODIUM (PORCINE) 1000 UNIT/ML IJ SOLN
INTRAMUSCULAR | Status: DC | PRN
  Administered 2022-04-01: 2000 [IU] via INTRAVENOUS
  Administered 2022-04-01: 14000 [IU] via INTRAVENOUS

## 2022-04-01 MED ORDER — PROTAMINE SULFATE 10 MG/ML IV SOLN
INTRAVENOUS | Status: DC | PRN
  Administered 2022-04-01: 50 mg via INTRAVENOUS

## 2022-04-01 MED ORDER — LIDOCAINE 2% (20 MG/ML) 5 ML SYRINGE
INTRAMUSCULAR | Status: DC | PRN
  Administered 2022-04-01: 100 mg via INTRAVENOUS

## 2022-04-01 MED ORDER — ACETAMINOPHEN 325 MG PO TABS: 650.0000 mg | ORAL_TABLET | ORAL | Status: DC | PRN

## 2022-04-01 MED ORDER — SODIUM CHLORIDE 0.9% FLUSH: 3.0000 mL | INTRAVENOUS | Status: DC | PRN

## 2022-04-01 MED ORDER — PROPOFOL 10 MG/ML IV BOLUS
INTRAVENOUS | Status: DC | PRN
  Administered 2022-04-01: 160 mg via INTRAVENOUS

## 2022-04-01 MED ORDER — PANTOPRAZOLE SODIUM 40 MG PO TBEC
40.0000 mg | DELAYED_RELEASE_TABLET | Freq: Every day | ORAL | 0 refills | Status: DC
  Filled 2022-04-01: qty 30, 30d supply, fill #0

## 2022-04-01 MED ORDER — ACETAMINOPHEN 500 MG PO TABS
1000.0000 mg | ORAL_TABLET | Freq: Once | ORAL | Status: AC
  Administered 2022-04-01: 1000 mg via ORAL
  Filled 2022-04-01: qty 2

## 2022-04-01 MED ORDER — ONDANSETRON HCL 4 MG/2ML IJ SOLN: 4.0000 mg | Freq: Four times a day (QID) | INTRAMUSCULAR | Status: DC | PRN

## 2022-04-01 SURGICAL SUPPLY — 20 items
BAG SNAP BAND KOVER 36X36 (MISCELLANEOUS) IMPLANT
CATH 8FR REPROCESSED SOUNDSTAR (CATHETERS) ×1 IMPLANT
CATH 8FR SOUNDSTAR REPROCESSED (CATHETERS) IMPLANT
CATH ABLAT QDOT MICRO BI TC DF (CATHETERS) IMPLANT
CATH OCTARAY 2.0 F 3-3-3-3-3 (CATHETERS) IMPLANT
CATH PIGTAIL STEERABLE D1 8.7 (WIRE) IMPLANT
CATH S-M CIRCA TEMP PROBE (CATHETERS) IMPLANT
CATH WEB BI DIR CSDF CRV REPRO (CATHETERS) IMPLANT
CLOSURE PERCLOSE PROSTYLE (VASCULAR PRODUCTS) IMPLANT
COVER SWIFTLINK CONNECTOR (BAG) ×1 IMPLANT
DEVICE CLOSURE MYNXGRIP 6/7F (Vascular Products) IMPLANT
PACK EP LATEX FREE (CUSTOM PROCEDURE TRAY) ×1
PACK EP LF (CUSTOM PROCEDURE TRAY) ×1 IMPLANT
PAD DEFIB RADIO PHYSIO CONN (PAD) ×1 IMPLANT
PATCH CARTO3 (PAD) IMPLANT
SHEATH CARTO VIZIGO MED CURVE (SHEATH) IMPLANT
SHEATH PINNACLE 8F 10CM (SHEATH) IMPLANT
SHEATH PINNACLE 9F 10CM (SHEATH) IMPLANT
SHEATH PROBE COVER 6X72 (BAG) IMPLANT
TUBING SMART ABLATE COOLFLOW (TUBING) IMPLANT

## 2022-04-01 NOTE — Progress Notes (Signed)
Recieved order for cepacol losenge for patient's sore throat, and patient states his left side is feeling better.

## 2022-04-01 NOTE — Anesthesia Procedure Notes (Signed)
Procedure Name: Intubation Date/Time: 04/01/2022 10:47 AM  Performed by: Inda Coke, CRNAPre-anesthesia Checklist: Patient identified, Emergency Drugs available, Suction available, Timeout performed and Patient being monitored Patient Re-evaluated:Patient Re-evaluated prior to induction Oxygen Delivery Method: Circle system utilized Preoxygenation: Pre-oxygenation with 100% oxygen Induction Type: IV induction Ventilation: Mask ventilation without difficulty Laryngoscope Size: Mac and 4 Grade View: Grade I Tube type: Oral Tube size: 7.5 mm Number of attempts: 1 Airway Equipment and Method: Stylet Placement Confirmation: ETT inserted through vocal cords under direct vision, positive ETCO2, CO2 detector and breath sounds checked- equal and bilateral Secured at: 23 cm Tube secured with: Tape Dental Injury: Teeth and Oropharynx as per pre-operative assessment

## 2022-04-01 NOTE — Anesthesia Preprocedure Evaluation (Addendum)
Anesthesia Evaluation  Patient identified by MRN, date of birth, ID band Patient awake    Reviewed: Allergy & Precautions, H&P , NPO status , Patient's Chart, lab work & pertinent test results, reviewed documented beta blocker date and time   History of Anesthesia Complications Negative for: history of anesthetic complications  Airway Mallampati: II  TM Distance: >3 FB Neck ROM: Full    Dental no notable dental hx. (+) Dental Advisory Given   Pulmonary sleep apnea    Pulmonary exam normal        Cardiovascular hypertension, Pt. on home beta blockers and Pt. on medications + CAD and + Past MI  Normal cardiovascular exam Rhythm:Regular Rate:Normal  TTE 10/23 IMPRESSIONS     1. Left ventricular ejection fraction, by estimation, is 60 to 65%. The  left ventricle has normal function. The left ventricle has no regional  wall motion abnormalities. Left ventricular diastolic parameters are  indeterminate.   2. Right ventricular systolic function is normal. The right ventricular  size is normal. There is normal pulmonary artery systolic pressure.   3. Left atrial size was moderately dilated.   4. The mitral valve is normal in structure. Trivial mitral valve  regurgitation. No evidence of mitral stenosis.   5. The aortic valve is grossly normal. There is moderate calcification of  the aortic valve. There is mild thickening of the aortic valve. Aortic  valve regurgitation is trivial. Aortic valve sclerosis/calcification is  present, without any evidence of  aortic stenosis.   6. The inferior vena cava is normal in size with greater than 50%  respiratory variability, suggesting right atrial pressure of 3 mmHg.   Comparison(s): Prior images unable to be directly viewed, comparison made  by report only. EF 60%, mild-mod LVH.     11/2021 Cath  Impression No impression found.  Narrative 1.  Stable coronary artery disease with  nonobstructive proximal and mid LAD plaquing, severe apical LAD stenosis, angiographically improved from the old cath study 2.  Patent left main, left circumflex, and RCA with mild nonobstructive plaque noted 3.  Normal LVEDP   Recommendations: Medical therapy for CAD.  The patient's coronary anatomy is stable and in fact somewhat improved from his prior heart catheterization.  Okay to resume apixaban tomorrow at his normal dosing schedule.     Neuro/Psych negative neurological ROS  negative psych ROS   GI/Hepatic Neg liver ROS,GERD  ,,  Endo/Other  Hypothyroidism    Renal/GU Renal InsufficiencyRenal disease  negative genitourinary   Musculoskeletal  (+) Arthritis , Osteoarthritis,    Abdominal   Peds  Hematology negative hematology ROS (+)   Anesthesia Other Findings   Reproductive/Obstetrics negative OB ROS                             Anesthesia Physical Anesthesia Plan  ASA: 3  Anesthesia Plan: General   Post-op Pain Management: Minimal or no pain anticipated and Tylenol PO (pre-op)*   Induction: Intravenous  PONV Risk Score and Plan: 3 and Ondansetron, Dexamethasone and Treatment may vary due to age or medical condition  Airway Management Planned: Oral ETT  Additional Equipment:   Intra-op Plan:   Post-operative Plan: Extubation in OR  Informed Consent: I have reviewed the patients History and Physical, chart, labs and discussed the procedure including the risks, benefits and alternatives for the proposed anesthesia with the patient or authorized representative who has indicated his/her understanding and acceptance.  Dental advisory given  Plan Discussed with: Anesthesiologist and CRNA  Anesthesia Plan Comments: (PAT note by Karoline Caldwell, PA-C:  CAD with NSTEMI in 2016 (cath showed 90% LAD distal stenosis and 50 to 60% mid LAD stenosis of a tortuous vessel, medical management was recommended due to lack of  approachable PCI target), prior CVA, paroxysmal Afib on Eliquis, failed watchman procedure 2016, frequent PVCs, interstitial lung disease, HTN, HLD, bladder and prostate cancer, CKD 3, and OSA on CPAP.  Most recent nuclear stress test 11/09/2019 was low risk.  Primary cardiologist is Dr. Debara Pickett.  Per telephone encounter 07/03/2020, stated okay to proceed with surgery, recommended Lovenox bridge.  Lovenox bridge detailed in pharmacist note 08/12/2020.  Follows with pulmonologist Dr. Vaughan Browner for interstitial lung disease.  Per note 02/28/2020, "Review of CT scan shows probable UIP pattern fibrosis. He has minimal changes at the base dating back to at least 2012 on CTs of the abdomen. It is more clearly progressive since his last CT abdomen in 2020. This is suggestive of IPF in the absence of any exposures or CTD symptoms."  PFTs at that time showed mild diffusion defect.  Patient was not interested in biopsy or treatment with antifibrotics at that time, 63-monthfollow-up recommended.  Preop labs reviewed, creatinine elevated 1.57 consistent with history of CKD, labs otherwise unremarkable.  EKG 04/18/2020: Sinus rhythm with frequent PVCs.  Rate 60.  High-resolution CT chest 05/12/2020: IMPRESSION: 1. No significant interval change in a pattern of mild pulmonary fibrosis with apical to basal gradient featuring irregular peripheral interstitial opacity and septal thickening, minimal traction bronchiectasis, and small areas of subpleural bronchiolectasis in the dependent lung bases. Findings remain with a "probable UIP "pattern by ATS pulmonary fibrosis criteria. Findings are categorized as probable UIP per consensus guidelines: Diagnosis of Idiopathic Pulmonary Fibrosis: An Official ATS/ERS/JRS/ALAT Clinical Practice Guideline. [place] Iss 5, p([phone]) [phone] Oct 09 2016. 2. Stable small pulmonary nodules of the left upper lobe measuring 5 mm or smaller. Additional follow-up as  indicated by clinical oncology protocol given history of multiple malignancies. 3. Coronary artery disease. 4. Punctuate nonobstructive calculus of the superior pole of the right kidney.  PFTs 03/07/2020: FVC3.73 [105%], FEV1 2.94 (119%], F/F 79, TLC 5.38 [81%], DLCO 15.99 [71%] Mild diffusion defect  Nuclear stress 11/09/19: . Nuclear stress EF: 54%. . The left ventricular ejection fraction is mildly decreased (45-54%). . There was no ST segment deviation noted during stress. . Defect 1: There is a small defect of moderate severity present in the apical inferior and apex location. . This is a low risk study.  Mildly abnormal, low risk stress nuclear study with apical thinning vs small prior infarct; no ischemia; EF 54 with normal wall motion.  TTE 05/31/2016: Conclusion: 1.  Left ventricle cavity is normal in size.  Mild to moderate concentric hypertrophy of left ventricle.  Normal wall motion.  Calculated EF 55%. 2.  Left atrial cavity is mildly dilated. 3.  Mild (grade 1) aortic regurgitation. 4.  Trace mitral regurgitation. 5.  Trace pulmonic regurgitation. )        Anesthesia Quick Evaluation

## 2022-04-01 NOTE — Progress Notes (Signed)
Dr. Myles Gip at bedside; patient c/o left lower abd quadrant jabbing pain. Groin sites level 0. Vital signs stable

## 2022-04-01 NOTE — Progress Notes (Signed)
Patient and family was giving discharge instructions. Patient procedural sites looks intact with no sign of bleeding or hematoma.

## 2022-04-01 NOTE — Transfer of Care (Signed)
Immediate Anesthesia Transfer of Care Note  Patient: Charles Wright  Procedure(s) Performed: ATRIAL FIBRILLATION ABLATION TRANSESOPHAGEAL ECHOCARDIOGRAM  Patient Location: PACU  Anesthesia Type:General  Level of Consciousness: awake, alert , and oriented  Airway & Oxygen Therapy: Patient Spontanous Breathing and Patient connected to nasal cannula oxygen  Post-op Assessment: Report given to RN and Post -op Vital signs reviewed and stable  Post vital signs: Reviewed and stable  Last Vitals:  Vitals Value Taken Time  BP 116/68 04/01/22 1307  Temp 36.7 C 04/01/22 1310  Pulse 71 04/01/22 1310  Resp 13 04/01/22 1310  SpO2 99 % 04/01/22 1310  Vitals shown include unvalidated device data.  Last Pain:  Vitals:   04/01/22 1310  TempSrc: Temporal  PainSc: Asleep         Complications: There were no known notable events for this encounter.

## 2022-04-01 NOTE — Anesthesia Postprocedure Evaluation (Signed)
Anesthesia Post Note  Patient: Charles Wright  Procedure(s) Performed: ATRIAL FIBRILLATION ABLATION TRANSESOPHAGEAL ECHOCARDIOGRAM     Patient location during evaluation: PACU Anesthesia Type: General Level of consciousness: sedated Pain management: pain level controlled Vital Signs Assessment: post-procedure vital signs reviewed and stable Respiratory status: spontaneous breathing and respiratory function stable Cardiovascular status: stable Postop Assessment: no apparent nausea or vomiting Anesthetic complications: no   There were no known notable events for this encounter.  Last Vitals:  Vitals:   04/01/22 1345 04/01/22 1350  BP: 127/68 133/73  Pulse: 72 70  Resp: 14 12  Temp:    SpO2: 99% 99%    Last Pain:  Vitals:   04/01/22 1422  TempSrc:   PainSc: 3                  Marselino Slayton DANIEL

## 2022-04-01 NOTE — Telephone Encounter (Signed)
[name] paged the on-call pager this afternoon with concerns of dysphagia and states that his evening pills are stuck in his throat. Denies prior occurences of dysphagia. Given his ablation earlier today and TEE, would like to rule out esophageal irritation.   Discussed with [name] that he should present to the ED so that the evaluation can be expedited. He expressed understanding. I ask that cardiology be consulted to help guide evaluation in the ED when he arrives.    Libby Maw, MD MS  [facility]

## 2022-04-01 NOTE — CV Procedure (Addendum)
Procedure: TEE  Sedation: Per anesthesiology  Indication: atrial fibrillation  Findings: Please see echo section for full report.  Normal LV size with mild LV hypertrophy.  EF 55-60%.  Normal RV size and systolic function.  Mild left atrial enlargement.  Large, bilobed LA appendage with no thrombus.  Normal RA with Chiari network.  There was a PFO present with left to right flow.  Trivial TR.  Mild-moderate MR.  Calcified, trileaflet aortic valve with no stenosis, mild regurgitation.  Normal caliber thoracic aorta with mild plaque.   May proceed with ablation.   Charles Wright 04/01/2022 11:04 AM

## 2022-04-01 NOTE — H&P (Addendum)
Electrophysiology Office Note:    Date:  04/01/2022   ID:  Charles Wright, DOB 04-28-1935, MRN [id number]  PCP:  Costella, Vincent J, [place] Providers Cardiologist:  Pixie Casino, MD Electrophysiologist:  Melida Quitter, MD     Referring MD: No ref. provider found   Chief complaint: palpitations, fatigu  History of Present Illness:    Charles Wright is a 87 y.o. male with a hx of CAD and NSTEMI in 2016, bladder cancer, prostate cancer, HLD, HTN, OSA referred by dr. Debara Pickett for AF management.  He was diagnosed with AF sometime around 2020. He was managed for a while with amiodarone. He doesn't particularly recall how effective it was for him, but he was diagnosed with IPF and is no longer taking it. The IPF may have been due to [place].  He has episodes of palpitations. These occur most frequently at night and awake him from sleep. Episodes sometimes occur when he is working in the yard. When this happens, he has to go inside and rest. He does not have chest pain, orthopnea, PND. He has a diagnosis of OSA but could not tolerate CPAP. Since being diagnosed, he lost a significant amount of weight.  I reviewed the patient's CT and labs. There was no LAA thrombus, but he has not taken apixaban for the past several days, so we will perform a TEE prior to the case. There have been no changes in the patient's diagnoses, medications, or condition since our recent clinic visit.   Past Medical History:  Diagnosis Date   Acute gout of left ankle    06-14-2015   Bladder cancer (HCC)    BCG's tx's   BPH (benign prostatic hypertrophy)    CAD (coronary artery disease) CARDIOLOGIST-  DR TILLEY   a. NSTEMI 12/2014 -  99% dLAD-2 (not amenable to PCI), 50% dLAD-1, 60% mLAD. Medical therapy was recommended.   Cancer of kidney (HCC)    left   Cough    HARSH NONPRODUCTIVE COUGH 1 AND 1/2 WEEKS   GERD (gastroesophageal reflux disease)    takes  OTC periodically   Gilbert's  syndrome    H/O cardiac catheterization    a. Note: Difficult radial access in 12/2014 - recommend femoral approach if cath needed in the future.   History of kidney stones    History of non-ST elevation myocardial infarction (NSTEMI)    12-12-2014   CARDIAC CATH W/ NO INTERVENTION X 2FEW DAYS APART   History of spinal fracture 2002   LUMBAR   Hyperlipidemia    Hypertension    Hypothyroidism    Myocardial infarction (HCC)    OSA on CPAP    SET ON 7 USES SOME NIGHTS   Paroxysmal A-fib [facility])    cardiologist-  dr Wynonia Lawman   Prostate cancer [facility]) 2019   last PSA  2.9  (montiored by urologist  dr Junious Silk) [place] FEB 2019 RAD DONE   Shingles    2 weeks ago   Wears glasses     Past Surgical History:  Procedure Laterality Date   BACK SURGERY  2014   LOWER l 1, 2 4 AND 5   BILATERAL INGUINAL HERNIA REPAIR     CARDIAC CATHETERIZATION N/A 12/13/2014   Procedure: Left Heart Cath and Coronary Angiography;  Surgeon: Leonie Man, MD;  Location: [place] [facility];  Service: Cardiovascular;  Laterality: N/A;  culprit lesion diat LAD-2  99%, not approachable via PCTA  due to extremely tortuous up stream LAD/  mLAD 60% and dLAD-1 50%, both are at the extremely tortuous segment and not PCI targets/  otherwise normal coronary arteries   COLONOSCOPY WITH PROPOFOL N/A 01/16/2019   Procedure: COLONOSCOPY WITH PROPOFOL;  Surgeon: Clarene Essex, MD;  Location: [facility];  Service: Endoscopy;  Laterality: N/A;   CYSTOSCOPY W/ RETROGRADES Left 08/22/2012   Procedure: CYSTOSCOPY WITH RETROGRADE PYELOGRAM;  left kidney washings;  Surgeon: Fredricka Bonine, MD;  Location: [facility];  Service: Urology;  Laterality: Left;   CYSTOSCOPY W/ RETROGRADES N/A 07/08/2015   Procedure: CYSTOSCOPY WITH RETROGRADE PYELOGRAM;  Surgeon: [name], MD;  Location: [facility];  Service: Urology;  Laterality: N/A;   CYSTOSCOPY W/ URETERAL STENT PLACEMENT Left 03/24/2019    Procedure: CYSTOSCOPY WITH RETROGRADE PYELOGRAM/URETERAL STENT PLACEMENT ureteroscopy biopsy of neoplasm;  Surgeon: [name], MD;  Location: [facility];  Service: Urology;  Laterality: Left;   CYSTOSCOPY W/ URETERAL STENT PLACEMENT Left 04/08/2019   Procedure: CYSTOSCOPY cystogram clot evacuation left ureteroscopy clot evacuation left stent exchange liimited transuretheral resectio of prastate and fulguration  bleeders of prostate;  Surgeon: [name], MD;  Location: [facility];  Service: Urology;  Laterality: Left;   CYSTOSCOPY WITH BIOPSY  08/22/2012   Procedure: CYSTOSCOPY WITH BIOPSY;  Surgeon: Fredricka Bonine, MD;  Location: [facility];  Service: Urology;;   CYSTOSCOPY WITH BIOPSY N/A 11/08/2014   Procedure: CYSTOSCOPY/BIOPSPY BLADDER INSTILLATION OF MARCAINE AND PYRIDIUM;  Surgeon: [name], MD;  Location[facility];  Service: Urology;  Laterality: N/A;   CYSTOSCOPY WITH BIOPSY N/A 07/08/2015   Procedure: CYSTOSCOPY WITH BLADDER BIOPSY, FULGURATION, RETROGRADE PYLOGRAM AND RENAL WASHING;  Surgeon: [name], MD;  Location: [facility];  Service: Urology;  Laterality: N/A;   CYSTOSCOPY WITH RETROGRADE PYELOGRAM, URETEROSCOPY AND STENT PLACEMENT Bilateral 07/30/2014   Procedure: CYSTOSCOPY WITH BLADDER BX FULERGATION AND BILATERAL RETROGRADE PYELOGRAM,;  Surgeon: [name], MD;  [facility];  Service: Urology;  Laterality: Bilateral;   CYSTOSCOPY WITH STENT PLACEMENT Left 03/24/2018   Procedure: STENT PLACEMENT AND BIOPSY;  Surgeon: [name], MD;  Location[facility];  Service: Urology;  Laterality: Left;   CYSTOSCOPY/RETROGRADE/URETEROSCOPY Bilateral 08/17/2013   Procedure: CYSTOSCOPY, BLADDER BIOPSY WITH BILATERAL RETROGRADE PYELOGRAM, LEFT URETEROSCOPY AND DILATION OF STRICTURE ;  Surgeon: [name], MD;  Location: [facility];  Service: Urology;  Laterality:  Bilateral;   CYSTOSCOPY/RETROGRADE/URETEROSCOPY Left 03/24/2018   Procedure: CYSTOSCOPY/RETROGRADE/URETEROSCOPY;  Surgeon: [name], MD;  Location: [facility];  Service: Urology;  Laterality: Left;   HOT HEMOSTASIS N/A 01/16/2019   Procedure: HOT HEMOSTASIS (ARGON PLASMA COAGULATION/BICAP);  Surgeon: Clarene Essex, MD;  Location: [facility];  Service: Endoscopy;  Laterality: N/A;   INSERTION OF MESH N/A 08/20/2020   Procedure: INSERTION OF MESH;  Surgeon: Ralene Ok, MD;  Location: [place];  Service: General;  Laterality: N/A;   LEFT ATRIAL APPENDAGE OCCLUSION N/A 01/09/2015   Procedure: LEFT ATRIAL APPENDAGE OCCLUSION;  Surgeon: [name], MD;  Location: [place] [facility];  Service: Cardiovascular;  Laterality: N/A;   LEFT HEART CATH AND CORONARY ANGIOGRAPHY N/A 11/20/2021   Procedure: LEFT HEART CATH AND CORONARY ANGIOGRAPHY;  Surgeon: Sherren Mocha, MD;  Location: [place] [facility];  Service: Cardiovascular;  Laterality: N/A;   LUMBAR LAMINECTOMY/DECOMPRESSION MICRODISCECTOMY Left 12/03/2013   Procedure: Left Lumbar three-four diskectomy with Lumbar four laminectomy ;  Surgeon: Newman Pies, MD;  Location: [place]  [facility];  Service: Neurosurgery;  Laterality: Left;  Left Lumbar three-four diskectomy with Lumbar four laminectomy    LYMPH NODE DISSECTION Bilateral 04/13/2019   Procedure: LYMPH NODE DISSECTION;  Surgeon: Alexis Frock, MD;  [facility];  Service: Urology;  Laterality: Bilateral;   ORIF LEFT ANKLE FX  2005   POLYPECTOMY  01/16/2019   Procedure: POLYPECTOMY;  Surgeon: Clarene Essex, MD;  [facility];  Service: Endoscopy;;   PROSTATE BIOPSY N/A 08/22/2012   Procedure: BIOPSY TRANSRECTAL ULTRASONIC PROSTATE (TUBP);  Surgeon: Fredricka Bonine, MD;  Location: [facility];  Service: Urology;  Laterality: N/A;   ROBOT ASSITED LAPAROSCOPIC NEPHROURETERECTOMY Left 04/13/2019   Procedure: XI ROBOT ASSITED LAPAROSCOPIC  NEPHROURETERECTOMY;  Surgeon: Alexis Frock, MD;  [facility];  Service: Urology;  Laterality: Left;  3 HRS   TEE WITHOUT CARDIOVERSION N/A 12/31/2014   Procedure: TRANSESOPHAGEAL ECHOCARDIOGRAM (TEE);  Surgeon: Pixie Casino, MD;  Location: [facility];  Service: Cardiovascular;  Laterality: N/A;  ef 55-60%/  mild MR, TR, and PR/  mild LAE and RAE   TRANSURETHRAL RESECTION OF BLADDER TUMOR  10/22/2011   Procedure: TRANSURETHRAL RESECTION OF BLADDER TUMOR (TURBT);  Surgeon: Fredricka Bonine, MD;  [facility];  Service: Urology;  Laterality: N/A;  TURBT, LEFT URETEROSCOPY, POSSIBLE URETERAL STENT    URETEROSCOPY  10/22/2011   Procedure: URETEROSCOPY;  Surgeon: Fredricka Bonine, MD;  Location: [facility];  Service: Urology;  Laterality: Left;   WISDOM TOOTH EXTRACTION     XI ROBOTIC ASSISTED VENTRAL HERNIA N/A 08/20/2020   Procedure: ROBOTIC INCISIONAL HERNIA REPAIR WITH MESH;  Surgeon: Ralene Ok, MD;  Location: [place];  Service: General;  Laterality: N/A;    Current Medications: Current Meds  Medication Sig   acetaminophen (TYLENOL) 500 MG tablet Take 1,000 mg by mouth every 6 (six) hours as needed for moderate pain or headache.   allopurinol (ZYLOPRIM) 300 MG tablet Take 300 mg by mouth every morning.    Cholecalciferol (VITAMIN D-3) 25 MCG (1000 UT) CAPS Take 2,000-3,000 Units by mouth See admin instructions.   ELIQUIS 2.5 MG TABS tablet TAKE 1 TABLET BY MOUTH TWICE A DAY   ipratropium (ATROVENT) 0.03 % nasal spray Place 1 spray into both nostrils 2 (two) times daily.   levothyroxine (SYNTHROID, LEVOTHROID) 50 MCG tablet Take 50 mcg by mouth daily before breakfast.   metoprolol tartrate (LOPRESSOR) 50 MG tablet Take 1 tablet (50 mg total) by mouth 2 (two) times daily. Take additional half tablet as needed for for palpitations.   rosuvastatin (CRESTOR) 20 MG tablet Take 1 tablet (20 mg total) by mouth daily.   Testosterone  1.62 % GEL Apply 2 Pump topically every other day. Applied to shoulder area     Allergies:   Bee venom, Losartan, and Oxycodone   Social History   Socioeconomic History   Marital status: Married    Spouse name: Not on file   Number of children: 2   Years of education: Not on file   Highest education level: Not on file  Occupational History   Occupation: Retired    Fish farm manager: OTHER  Tobacco Use   Smoking status: Never   Smokeless tobacco: Never  Vaping Use   Vaping Use: Never used  Substance and Sexual Activity   Alcohol use: Yes    Alcohol/week: 7.0 standard drinks of alcohol    Types: 7 Glasses of wine per week    Comment: red wine glass daily or  shot of whiskey   Drug use: No   Sexual activity: Yes  Other Topics Concern   Not on file  Social History Narrative   Not on file   Social Determinants of Health   Financial Resource Strain: Not on file  Food Insecurity: Not on file  Transportation Needs: Not on file  Physical Activity: Not on file  Stress: Not on file  Social Connections: Not on file     Family History: The patient's family history includes Cancer in his sister; Heart attack in his father; Leukemia in his mother. There is no history of Hypertension, Stroke, Colon cancer, Esophageal cancer, Rectal cancer, or Stomach cancer.  ROS:   Please see the history of present illness.    All other systems reviewed and are negative.  EKGs/Labs/Other Studies Reviewed Today:     EKG:  Last EKG results: sinus rhythm, rate 61 bpm   Recent Labs: 05/14/2021: Magnesium 2.2; TSH 3.610 09/01/2021: ALT 17 03/11/2022: BUN 19; Creatinine, Ser 1.35; Hemoglobin 16.3; Platelets 264; Potassium 5.3; Sodium 140     Physical Exam:    VS:  BP 114/71   Pulse 68   Temp 97.7 F (36.5 C) (Oral)   Resp 16   Ht 5' 7"$  (1.702 m)   Wt 83.9 kg   SpO2 97%   BMI 28.98 kg/m     Wt Readings from Last 3 Encounters:  04/01/22 83.9 kg  01/26/22 88 kg  12/28/21 84.7 kg     GEN:  Well nourished, well developed in no acute distress CARDIAC: RRR, no murmurs, rubs, gallops RESPIRATORY:  Normal work of breathing MUSCULOSKELETAL: no edema    ASSESSMENT & PLAN:    Paroxysmal atrial fibrillation: symptomatic. Events are frequently nocturnal. He is unfortunately not able to tolerate CPAP. Because he is symptomatic, I recommended rhythm control.He presents today for ablation. We discussed the risks and anticipated benefits previously. We will perform TEE prior to the ablation procedure. Secondary hypercoagulable state: continue apixaban. History of failed wathman attempt CAD - history of NSTEMI      Medication Adjustments/Labs and Tests Ordered: Current medicines are reviewed at length with the patient today.  Concerns regarding medicines are outlined above.  Orders Placed This Encounter  Procedures   Informed Consent Details: Physician/Practitioner Attestation; Transcribe to consent form and obtain patient signature   Initiate Pre-op Protocol   Void on call to EP Lab   Confirm CBC and BMP (or CMP) results within 7 days for inpatient and 30 days for outpatient:   Clip right and left femoral area PM before surgery   Clip right internal jugular area PM before surgery   Pre-admission testing diagnosis   Informed Consent Details: Physician/Practitioner Attestation; Transcribe to consent form and obtain patient signature   EP STUDY   ECHO TEE   Insert peripheral IV   Meds ordered this encounter  Medications   0.9 %  sodium chloride infusion   acetaminophen (TYLENOL) tablet 1,000 mg     Signed, Melida Quitter, MD  04/01/2022 10:02 AM    [place]

## 2022-04-01 NOTE — Interval H&P Note (Signed)
History and Physical Interval Note:  04/01/2022 10:53 AM  Charles Wright  has presented today for surgery, with the diagnosis of afib.  The various methods of treatment have been discussed with the patient and family. After consideration of risks, benefits and other options for treatment, the patient has consented to  Procedure(s): ATRIAL FIBRILLATION ABLATION (N/A) TRANSESOPHAGEAL ECHOCARDIOGRAM (N/A) as a surgical intervention.  The patient's history has been reviewed, patient examined, no change in status, stable for surgery.  I have reviewed the patient's chart and labs.  Questions were answered to the patient's satisfaction.     [name] [employer]

## 2022-04-01 NOTE — Discharge Instructions (Addendum)

## 2022-04-02 ENCOUNTER — Telehealth: Payer: Self-pay | Admitting: Internal Medicine

## 2022-04-02 ENCOUNTER — Encounter (HOSPITAL_COMMUNITY): Payer: Self-pay | Admitting: Cardiovascular Disease

## 2022-04-02 NOTE — Telephone Encounter (Signed)
I think fluids and time for now.  I did not encounter any particular difficulty during procedure so surprised.

## 2022-04-02 NOTE — Telephone Encounter (Signed)
Patient stated that yesterday during TEE, he received an esophageal injury. Last evening, his throat was swollen and it was difficult for him to swallow  meds. It took until early AM before pills dissolved in his esophagus. He almost went to the ED. He wants to know what he can take to give him relief so he can swallow.

## 2022-04-02 NOTE — Telephone Encounter (Signed)
Spoke to patient, made aware of recommendations.  Patient states he is unable to take any medication due to them getting stuck in his throat.  He states he was given colchicine but is unable to take this and the lozenges are not helping.   Advised he could try throat spray.    Patient wanted to ensure MD was aware he is not taking medication due to this.   Also discussed if unable to swallow he may need to proceed to ER for evaluation (as recommended last night).  Patient states he will be waiting 18/19 hours in the ED if he goes.      Will send to MD to review for further recommendations.

## 2022-04-02 NOTE — Telephone Encounter (Signed)
Patient stated during his procedure yesterday his esophageus was injured and he is unable to swallow properly and has not been able to take his medication.  Patient would like to know next steps.

## 2022-04-02 NOTE — Telephone Encounter (Signed)
Patient is following up requesting a call back before EOD if possible. He states he was advised that someone would be calling him back.

## 2022-04-03 NOTE — Telephone Encounter (Signed)
Surprising reaction given uneventful TEE at the time (no resistance, blood on scope, etc).  If not any better should go to the ER for evaluation.

## 2022-04-07 NOTE — Telephone Encounter (Signed)
Mealor, Yetta Barre, MD  Silverio Lay, RN; Larey Dresser, MD; Shawna Orleans L2 days ago   AM I spoke with him Friday and again this morning. I advised him to crush his eliquis and continue it as prescribed. He reprots that he is doing much better this morning and the pills are going down ok.

## 2022-04-20 ENCOUNTER — Telehealth: Payer: Self-pay | Admitting: Internal Medicine

## 2022-04-20 DIAGNOSIS — I480 Paroxysmal atrial fibrillation: Secondary | ICD-10-CM | POA: Diagnosis not present

## 2022-04-20 DIAGNOSIS — Z01818 Encounter for other preprocedural examination: Secondary | ICD-10-CM | POA: Diagnosis not present

## 2022-04-20 DIAGNOSIS — Z7982 Long term (current) use of aspirin: Secondary | ICD-10-CM | POA: Diagnosis not present

## 2022-04-20 DIAGNOSIS — G4733 Obstructive sleep apnea (adult) (pediatric): Secondary | ICD-10-CM | POA: Diagnosis not present

## 2022-04-20 DIAGNOSIS — Z7901 Long term (current) use of anticoagulants: Secondary | ICD-10-CM | POA: Diagnosis not present

## 2022-04-20 DIAGNOSIS — H903 Sensorineural hearing loss, bilateral: Secondary | ICD-10-CM | POA: Diagnosis not present

## 2022-04-20 NOTE — Telephone Encounter (Signed)
Patient with diagnosis of atrial fibrillation on Eliquis for anticoagulation.    Procedure: cochlear implant Date of procedure: 04/26/22   CHA2DS2-VASc Score = 6   This indicates a 9.7% annual risk of stroke. The patient's score is based upon: CHF History: 0 HTN History: 1 Diabetes History: 0 Stroke History: 2 Vascular Disease History: 1 Age Score: 2 Gender Score: 0   Chart indicates history of stroke - 2018 had visual disturbances.  Neurology noted no stroke on CT.  In 11/2018 patient questioned stroke dx in chart, stating no history.  He has been previously bridged when holding Eliquis more than 1 day.   Based on this information would suggest that he is okay to hold Eliquis x 3 days for procedure, but will have primary cardiologist review to determine if he would like bridging.    CrCl 50 Platelet count 264    **This guidance is not considered finalized until pre-operative APP has relayed final recommendations.**

## 2022-04-20 NOTE — Telephone Encounter (Signed)
Dr. Debara Pickett can you please advise on holding aspirin for Charles Wright who is scheduled to undergo a cochlear implant.  He has a PMH of NSTEMI in 2016  with LAD disease not amenable for PCI, TIA/CVA.  Please send your recommendations to [facility] PREOP.  Thank you, Ambrose Pancoast, NP

## 2022-04-20 NOTE — Telephone Encounter (Signed)
Pharmacy please advise on holding Eliquis prior to cochlear implant scheduled for 04/26/2022. Thank you.

## 2022-04-20 NOTE — Telephone Encounter (Signed)
Cochlear implant on March 18     Pre-operative Risk Assessment    Patient Name: Charles Wright  DOB: 1935-09-14 MRN: [id number]      Request for Surgical Clearance    Procedure:   Cochlear implant   Date of Surgery:  Clearance 04/26/22                                 Surgeon:  Dr Willette Pa Surgeon's Group or Practice Name:    Phone number:  [phone] Fax number:  [phone]   Type of Clearance Requested:   - Medical  - Pharmacy:  Hold Aspirin and Apixaban (Eliquis) 5 days for Asprin 3 days for Eliquis  TB  Type of Anesthesia:  General    Additional requests/questions:   patient had recent ablation and can be post pond if needed  Charles Wright   04/20/2022, 4:11 PM

## 2022-04-22 NOTE — Telephone Encounter (Signed)
I s/w the pt and he has been scheduled for in office appt 04/27/22 @ 8:50 with Coletta Memos, FNP at [place] office. I will update all parties involved.

## 2022-04-22 NOTE — Telephone Encounter (Signed)
Ok to hold aspirin 7 days prior to procedure.  Agree with holding Eliquis for 3 days -no bridging necessary.  Dr. Debara Pickett

## 2022-04-22 NOTE — Telephone Encounter (Addendum)
   Name: Charles Wright  DOB: October 15, 1935  MRN: [id number]  Primary Cardiologist: Pixie Casino, MD  Chart reviewed as part of pre-operative protocol coverage. Because of Vincenzo Wampler Berrie's past medical history and time since last visit, he will require a follow-up in-office visit in order to better assess preoperative cardiovascular risk.  Pre-op covering staff: - Please schedule appointment and call patient to inform them. If patient already had an upcoming appointment within acceptable timeframe, please add "pre-op clearance" to the appointment notes so provider is aware. - Please contact requesting surgeon's office via preferred method (i.e, phone, fax) to inform them of need for appointment prior to surgery.  Per Dr. Debara Pickett, he may hold Aspirin for 5-7 days prior to procedure. He may hold Eliquis for 3 days prior. Pt does not need bridging with Lovenox per MD.   Lenna Sciara, NP  04/22/2022, 12:19 PM

## 2022-04-26 DIAGNOSIS — M48061 Spinal stenosis, lumbar region without neurogenic claudication: Secondary | ICD-10-CM | POA: Diagnosis not present

## 2022-04-26 DIAGNOSIS — M5416 Radiculopathy, lumbar region: Secondary | ICD-10-CM | POA: Diagnosis not present

## 2022-04-26 NOTE — Progress Notes (Unsigned)
Cardiology Office Note:    Date:  04/27/2022   ID:  Charles Wright, DOB January 29, 1936, MRN [id number]  PCP:  Costella, Vincent J, [place] Providers Cardiologist:  Pixie Casino, MD Electrophysiologist:  Melida Quitter, MD     Referring MD: Traci Sermon, [place]*   CC: preoperative clearance   History of Present Illness:    Charles Wright is a 87 y.o. male with a hx of PAF, hypertension, CAD (NSTEMI 2016 LAD dx not amenable to PCI, moderate mid >proximal LAD rec med therapy), OSA on unable to tolerate CPAP., history of bladder cancer, history of prostate cancer, HLD, palpitations.  Former patient of Dr. Wynonia Lawman, establish care with Dr. Debara Pickett in 2020.  He had a stress test in 2021 that was performed which showed small fixed apical defect without any ischemia, consistent with known distal LAD severe stenosis.   Initially diagnosed with atrial fibrillation sometime around 2020, was managed for some time on amiodarone.  He was subsequently diagnosed with IPF and was no longer taking amiodarone.  Continue to have episodes of palpitations that were bothersome and could wake him up at night.  He wore a monitor which showed some recurrent atrial fibrillation/flutter and SVT with a low burden of about 3%. His metoprolol was then increased up to 50 mg twice daily.   LHC in October 2023, showed stable LAD stenosis, nonobstructive proximal and mid LAD which was improved from previous cath.   Underwent atrial fibrillation ablation with Dr. Myles Gip on 04/01/22.  TEE at this time showed normal LV size with mild LV hypertrophy.  EF 55-60%.  Normal RV size and systolic function.  Mild left atrial enlargement.  Large, bilobed LA appendage with no thrombus.  Normal RA with Chiari network.  There was a PFO present with left to right flow.  Trivial TR.  Mild-moderate MR.  Calcified, trileaflet aortic valve with no stenosis, mild regurgitation.  Normal caliber thoracic aorta with mild plaque.    He presents today for preoperative clearance for upcoming cochlear implant surgery.  He is doing well since his recent ablation.  No recurrence of A-fib that he is aware of, or palpitations.  He continues to be very active. He denies chest pain, palpitations, dyspnea, pnd, orthopnea, n, v, dizziness, syncope, edema, weight gain, or early satiety. He denies hematochezia, hematuria or hemoptysis.    Past Medical History:  Diagnosis Date   Acute gout of left ankle    06-14-2015   Bladder cancer (HCC)    BCG's tx's   BPH (benign prostatic hypertrophy)    CAD (coronary artery disease) CARDIOLOGIST-  DR TILLEY   a. NSTEMI 12/2014 -  99% dLAD-2 (not amenable to PCI), 50% dLAD-1, 60% mLAD. Medical therapy was recommended.   Cancer of kidney (HCC)    left   Cough    HARSH NONPRODUCTIVE COUGH 1 AND 1/2 WEEKS   GERD (gastroesophageal reflux disease)    takes  OTC periodically   Gilbert's syndrome    H/O cardiac catheterization    a. Note: Difficult radial access in 12/2014 - recommend femoral approach if cath needed in the future.   History of kidney stones    History of non-ST elevation myocardial infarction (NSTEMI)    12-12-2014   CARDIAC CATH W/ NO INTERVENTION X 2FEW DAYS APART   History of spinal fracture 2002   LUMBAR   Hyperlipidemia    Hypertension    Hypothyroidism    Myocardial infarction ([place])  OSA on CPAP    SET ON 7 USES SOME NIGHTS   Paroxysmal A-fib [facility])    cardiologist-  dr Wynonia Lawman   Prostate cancer [facility]) 2019   last PSA  2.9  (montiored by urologist  dr Junious Silk) [place] FEB 2019 RAD DONE   Shingles    2 weeks ago   Wears glasses     Past Surgical History:  Procedure Laterality Date   ATRIAL FIBRILLATION ABLATION N/A 04/01/2022   Procedure: ATRIAL FIBRILLATION ABLATION;  Surgeon: Melida Quitter, MD;  Location: [place] [facility];  Service: Cardiovascular;  Laterality: N/A;   BACK SURGERY  2014   LOWER l 1, 2 4 AND 5   BILATERAL INGUINAL HERNIA REPAIR      CARDIAC CATHETERIZATION N/A 12/13/2014   Procedure: Left Heart Cath and Coronary Angiography;  Surgeon: Leonie Man, MD;  Location: [place] [facility];  Service: Cardiovascular;  Laterality: N/A;  culprit lesion diat LAD-2  99%, not approachable via PCTA  due to extremely tortuous up stream LAD/  mLAD 60% and dLAD-1 50%, both are at the extremely tortuous segment and not PCI targets/  otherwise normal coronary arteries   COLONOSCOPY WITH PROPOFOL N/A 01/16/2019   Procedure: COLONOSCOPY WITH PROPOFOL;  Surgeon: Clarene Essex, MD;  [facility];  Service: Endoscopy;  Laterality: N/A;   CYSTOSCOPY W/ RETROGRADES Left 08/22/2012   Procedure: CYSTOSCOPY WITH RETROGRADE PYELOGRAM;  left kidney washings;  Surgeon: Fredricka Bonine, MD;  Location: [facility];  Service: Urology;  Laterality: Left;   CYSTOSCOPY W/ RETROGRADES N/A 07/08/2015   Procedure: CYSTOSCOPY WITH RETROGRADE PYELOGRAM;  Surgeon: [name], MD;  Location: [facility];  Service: Urology;  Laterality: N/A;   CYSTOSCOPY W/ URETERAL STENT PLACEMENT Left 03/24/2019   Procedure: CYSTOSCOPY WITH RETROGRADE PYELOGRAM/URETERAL STENT PLACEMENT ureteroscopy biopsy of neoplasm;  Surgeon: [name], MD;  [facility];  Service: Urology;  Laterality: Left;   CYSTOSCOPY W/ URETERAL STENT PLACEMENT Left 04/08/2019   Procedure: CYSTOSCOPY cystogram clot evacuation left ureteroscopy clot evacuation left stent exchange liimited transuretheral resectio of prastate and fulguration  bleeders of prostate;  Surgeon: [name], MD;  [facility];  Service: Urology;  Laterality: Left;   CYSTOSCOPY WITH BIOPSY  08/22/2012   Procedure: CYSTOSCOPY WITH BIOPSY;  Surgeon: Fredricka Bonine, MD;  Location: [facility];  Service: Urology;;   CYSTOSCOPY WITH BIOPSY N/A 11/08/2014   Procedure: CYSTOSCOPY/BIOPSPY BLADDER INSTILLATION OF MARCAINE AND PYRIDIUM;  Surgeon: [name], MD;  Location: [facility];  Service: Urology;  Laterality: N/A;   CYSTOSCOPY WITH BIOPSY N/A 07/08/2015   Procedure: CYSTOSCOPY WITH BLADDER BIOPSY, FULGURATION, RETROGRADE PYLOGRAM AND RENAL WASHING;  Surgeon: [name], MD;  Location: [facility];  Service: Urology;  Laterality: N/A;   CYSTOSCOPY WITH RETROGRADE PYELOGRAM, URETEROSCOPY AND STENT PLACEMENT Bilateral 07/30/2014   Procedure: CYSTOSCOPY WITH BLADDER BX FULERGATION AND BILATERAL RETROGRADE PYELOGRAM,;  Surgeon: [name], MD;  [facility];  Service: Urology;  Laterality: Bilateral;   CYSTOSCOPY WITH STENT PLACEMENT Left 03/24/2018   Procedure: STENT PLACEMENT AND BIOPSY;  Surgeon: [name], MD;  Location: [facility];  Service: Urology;  Laterality: Left;   CYSTOSCOPY/RETROGRADE/URETEROSCOPY Bilateral 08/17/2013   Procedure: CYSTOSCOPY, BLADDER BIOPSY WITH BILATERAL RETROGRADE PYELOGRAM, LEFT URETEROSCOPY AND DILATION OF STRICTURE ;  Surgeon: [name], MD;  Location: [facility];  Service: Urology;  Laterality: Bilateral;   CYSTOSCOPY/RETROGRADE/URETEROSCOPY Left 03/24/2018  Procedure: CYSTOSCOPY/RETROGRADE/URETEROSCOPY;  Surgeon: [name], MD;  Location: [facility];  Service: Urology;  Laterality: Left;   HOT HEMOSTASIS N/A 01/16/2019   Procedure: HOT HEMOSTASIS (ARGON PLASMA COAGULATION/BICAP);  Surgeon: Clarene Essex, MD;  Location: [facility];  Service: Endoscopy;  Laterality: N/A;   INSERTION OF MESH N/A 08/20/2020   Procedure: INSERTION OF MESH;  Surgeon: Ralene Ok, MD;  Location: [place];  Service: General;  Laterality: N/A;   LEFT ATRIAL APPENDAGE OCCLUSION N/A 01/09/2015   Procedure: LEFT ATRIAL APPENDAGE OCCLUSION;  Surgeon: [name], MD;  Location: [place] [facility];  Service: Cardiovascular;  Laterality: N/A;   LEFT HEART CATH AND CORONARY ANGIOGRAPHY N/A 11/20/2021   Procedure: LEFT HEART  CATH AND CORONARY ANGIOGRAPHY;  Surgeon: Sherren Mocha, MD;  Location: [place] [facility];  Service: Cardiovascular;  Laterality: N/A;   LUMBAR LAMINECTOMY/DECOMPRESSION MICRODISCECTOMY Left 12/03/2013   Procedure: Left Lumbar three-four diskectomy with Lumbar four laminectomy ;  Surgeon: Newman Pies, MD;  Location: [place] [facility];  Service: Neurosurgery;  Laterality: Left;  Left Lumbar three-four diskectomy with Lumbar four laminectomy    LYMPH NODE DISSECTION Bilateral 04/13/2019   Procedure: LYMPH NODE DISSECTION;  Surgeon: Alexis Frock, MD;  [facility];  Service: Urology;  Laterality: Bilateral;   ORIF LEFT ANKLE FX  2005   POLYPECTOMY  01/16/2019   Procedure: POLYPECTOMY;  Surgeon: Clarene Essex, MD;  [facility];  Service: Endoscopy;;   PROSTATE BIOPSY N/A 08/22/2012   Procedure: BIOPSY TRANSRECTAL ULTRASONIC PROSTATE (TUBP);  Surgeon: Fredricka Bonine, MD;  Location[facility];  Service: Urology;  Laterality: N/A;   ROBOT ASSITED LAPAROSCOPIC NEPHROURETERECTOMY Left 04/13/2019   Procedure: XI ROBOT ASSITED LAPAROSCOPIC NEPHROURETERECTOMY;  Surgeon: Alexis Frock, MD;  [facility];  Service: Urology;  Laterality: Left;  3 HRS   TEE WITHOUT CARDIOVERSION N/A 12/31/2014   Procedure: TRANSESOPHAGEAL ECHOCARDIOGRAM (TEE);  Surgeon: Pixie Casino, MD;  Location: [facility];  Service: Cardiovascular;  Laterality: N/A;  ef 55-60%/  mild MR, TR, and PR/  mild LAE and RAE   TEE WITHOUT CARDIOVERSION N/A 04/01/2022   Procedure: TRANSESOPHAGEAL ECHOCARDIOGRAM;  Surgeon: Melida Quitter, MD;  Location: [place] [facility];  Service: Cardiovascular;  Laterality: N/A;   TRANSURETHRAL RESECTION OF BLADDER TUMOR  10/22/2011   Procedure: TRANSURETHRAL RESECTION OF BLADDER TUMOR (TURBT);  Surgeon: Fredricka Bonine, MD;  Location: [facility];  Service: Urology;  Laterality: N/A;  TURBT, LEFT URETEROSCOPY, POSSIBLE URETERAL STENT     URETEROSCOPY  10/22/2011   Procedure: URETEROSCOPY;  Surgeon: Fredricka Bonine, MD;  Location: [facility];  Service: Urology;  Laterality: Left;   WISDOM TOOTH EXTRACTION     XI ROBOTIC ASSISTED VENTRAL HERNIA N/A 08/20/2020   Procedure: ROBOTIC INCISIONAL HERNIA REPAIR WITH MESH;  Surgeon: Ralene Ok, MD;  Location: [place];  Service: General;  Laterality: N/A;    Current Medications: Current Meds  Medication Sig   acetaminophen (TYLENOL) 500 MG tablet Take 1,000 mg by mouth every 6 (six) hours as needed for moderate pain or headache.   allopurinol (ZYLOPRIM) 300 MG tablet Take 300 mg by mouth every morning.    aspirin EC 81 MG tablet Take 81 mg by mouth every evening.   Cholecalciferol (VITAMIN D-3) 25 MCG (1000 UT) CAPS Take 2,000-3,000 Units by mouth See admin instructions.   doxycycline (MONODOX) 50 MG capsule Take 50 mg by mouth daily as needed (rosacea).   ELIQUIS 2.5 MG TABS  tablet TAKE 1 TABLET BY MOUTH TWICE A DAY   EPINEPHrine 0.3 mg/0.3 mL IJ SOAJ injection Inject 0.3 mLs (0.3 mg total) into the muscle once. (Patient taking differently: Inject 0.3 mg into the muscle as needed for anaphylaxis.)   GLUCOSAMINE SULFATE PO Take 750 mg by mouth daily.   ipratropium (ATROVENT) 0.03 % nasal spray Place 1 spray into both nostrils 2 (two) times daily.   levothyroxine (SYNTHROID, LEVOTHROID) 50 MCG tablet Take 50 mcg by mouth daily before breakfast.   metoprolol tartrate (LOPRESSOR) 50 MG tablet Take 1 tablet (50 mg total) by mouth 2 (two) times daily. Take additional half tablet as needed for for palpitations.   nitroGLYCERIN (NITROSTAT) 0.4 MG SL tablet Place 1 tablet (0.4 mg total) under the tongue every 5 (five) minutes x 3 doses as needed for chest pain.   pantoprazole (PROTONIX) 40 MG tablet Take 1 tablet (40 mg total) by mouth daily.   rosuvastatin (CRESTOR) 20 MG tablet Take 1 tablet (20 mg total) by mouth daily.   Testosterone 1.62 % GEL Apply 2 Pump  topically every other day. Applied to shoulder area     Allergies:   Bee venom, Losartan, and Oxycodone   Social History   Socioeconomic History   Marital status: Married    Spouse name: Not on file   Number of children: 2   Years of education: Not on file   Highest education level: Not on file  Occupational History   Occupation: Retired    Fish farm manager: OTHER  Tobacco Use   Smoking status: Never   Smokeless tobacco: Never  Vaping Use   Vaping Use: Never used  Substance and Sexual Activity   Alcohol use: Yes    Alcohol/week: 7.0 standard drinks of alcohol    Types: 7 Glasses of wine per week    Comment: red wine glass daily or shot of whiskey   Drug use: No   Sexual activity: Yes  Other Topics Concern   Not on file  Social History Narrative   Not on file   Social Determinants of Health   Financial Resource Strain: Not on file  Food Insecurity: Not on file  Transportation Needs: Not on file  Physical Activity: Not on file  Stress: Not on file  Social Connections: Not on file     Family History: The patient's family history includes Cancer in his sister; Heart attack in his father; Leukemia in his mother. There is no history of Hypertension, Stroke, Colon cancer, Esophageal cancer, Rectal cancer, or Stomach cancer.  ROS:   Please see the history of present illness.     All other systems reviewed and are negative.  EKGs/Labs/Other Studies Reviewed:    The following studies were reviewed today:  04/01/2022 TEE -  Normal LV size with mild LV hypertrophy.  EF 55-60%.  Normal RV size and systolic function.  Mild left atrial enlargement.  Large, bilobed LA appendage with no thrombus.  Normal RA with Chiari network.  There was a PFO present with left to right flow.  Trivial TR.  Mild-moderate MR.  Calcified, trileaflet aortic valve with no stenosis, mild regurgitation.  Normal caliber thoracic aorta with mild plaque.   04/01/2022 atrial fibrillation  ablation- CONCLUSIONS: 1. Successful PVI 2. Intracardiac echo reveals no effusion 3. No early apparent complications.  03/26/2022 CT cardiac morphology/pulmonary vein with calcium score -  IMPRESSION: 1. There is normal pulmonary vein drainage into the left atrium.   2. The left atrial appendage is large  with two lobes and ostial size 32 x 27 mm and length 31 mm. There is no thrombus in the left atrial appendage.   3. The esophagus runs in the left atrial midline and is not in the proximity to any of the pulmonary veins.   4. Coronary calcium score 405.  Age-based comparison ends at age 73.  11/20/2021 left heart cath- 1.  Stable coronary artery disease with nonobstructive proximal and mid LAD plaquing, severe apical LAD stenosis, angiographically improved from the old cath study 2.  Patent left main, left circumflex, and RCA with mild nonobstructive plaque noted 3.  Normal LVEDP   Recommendations: Medical therapy for CAD.  The patient's coronary anatomy is stable and in fact somewhat improved from his prior heart catheterization.  Okay to resume apixaban tomorrow at his normal dosing schedule.      EKG:  EKG is  ordered today.  The ekg ordered today demonstrates SR, HR 67 bpm  Recent Labs: 05/14/2021: Magnesium 2.2; TSH 3.610 09/01/2021: ALT 17 03/11/2022: BUN 19; Creatinine, Ser 1.35; Hemoglobin 16.3; Platelets 264; Potassium 5.3; Sodium 140  Recent Lipid Panel    Component Value Date/Time   CHOL 111 09/01/2021 0947   TRIG 90 09/01/2021 0947   HDL 38 (L) 09/01/2021 0947   CHOLHDL 2.9 09/01/2021 0947   CHOLHDL 4.4 12/12/2014 1721   VLDL 18 12/12/2014 1721   LDLCALC 55 09/01/2021 0947   LDLDIRECT 152 (H) 05/14/2021 1428     Risk Assessment/Calculations:    CHA2DS2-VASc Score = 6   This indicates a 9.7% annual risk of stroke. The patient's score is based upon: CHF History: 0 HTN History: 1 Diabetes History: 0 Stroke History: 2 Vascular Disease History: 1 Age Score:  2 Gender Score: 0               Physical Exam:    VS:  BP 106/82 (BP Location: Left Arm, Patient Position: Sitting, Cuff Size: Normal)   Pulse 67   Ht 5\' 7"  (1.702 m)   Wt 189 lb 12.8 oz (86.1 kg)   BMI 29.73 kg/m     Wt Readings from Last 3 Encounters:  04/27/22 189 lb 12.8 oz (86.1 kg)  04/01/22 185 lb (83.9 kg)  01/26/22 194 lb (88 kg)     GEN:  Well nourished, well developed in no acute distress HEENT: Normal NECK: No JVD; No carotid bruits LYMPHATICS: No lymphadenopathy CARDIAC: RRR, no murmurs, rubs, gallops RESPIRATORY:  Clear to auscultation without rales, wheezing or rhonchi  ABDOMEN: Soft, non-tender, non-distended MUSCULOSKELETAL:  No edema; No deformity  SKIN: Warm and dry NEUROLOGIC:  Alert and oriented x 3 PSYCHIATRIC:  Normal affect   ASSESSMENT:    1. PAF (paroxysmal atrial fibrillation) ([place])   2. Coronary artery disease involving native coronary artery of native heart with unstable angina pectoris ([place])   3. Hyperlipidemia LDL goal <70   4. Interstitial pulmonary disease ([place])   5. Long term current use of anticoagulant therapy   6. Preoperative cardiovascular examination    PLAN:    In order of problems listed above:  PAF -s/p ablation on 04/01/2022.  He is in NSR today.  He denies recurrence of palpitations or atrial fibrillation.  He continues to feel well. CHA2DS2-VASc Score = 6 [CHF History: 0, HTN History: 1, Diabetes History: 0, Stroke History: 2, Vascular Disease History: 1, Age Score: 2, Gender Score: 0].  Therefore, the patient's annual risk of stroke is 9.7 %.  Continue reduced dose Eliquis (4  out of the last 6 creatinine have been elevated greater than 1.5), continue metoprolol.  CAD - LHC in October 2023, showed stable LAD stenosis, nonobstructive proximal and mid LAD which was improved from previous cath. Stable with no anginal symptoms. No indication for ischemic evaluation.  Continue aspirin, metoprolol, rosuvastatin.  HLD -LDL on  09/01/2021 was well-controlled at 55, continue rosuvastatin.  Preoperative cardiovascular examination - According to the Revised Cardiac Risk Index (RCRI), his Perioperative Risk of Major Cardiac Event is (%): 0.9 His Functional Capacity in METs is: 8.33 according to the Duke Activity Status Index (DASI). Therefore, based on ACC/AHA guidelines, patient would be at acceptable risk for the planned procedure without further cardiovascular testing. I will route this recommendation to the requesting party via Epic fax function.   Regarding his ASA and Plavix: Ok to hold aspirin 7 days prior to procedure. Ok to hold his Eliquis 3 days prior to surgery. Please restart ASA and Eliquis as soon as hemostatis is achieved.   Medication Adjustments/Labs and Tests Ordered: Current medicines are reviewed at length with the patient today.  Concerns regarding medicines are outlined above.  Orders Placed This Encounter  Procedures   EKG 12-Lead   No orders of the defined types were placed in this encounter.   Patient Instructions  Medication Instructions:  The current medical regimen is effective;  continue present plan and medications as directed. Please refer to the Current Medication list given to you today.  *If you need a refill on your cardiac medications before your next appointment, please call your pharmacy*  Lab Work: NONE If you have labs (blood work) drawn today and your tests are completely normal, you will receive your results only by:  [place] (if you have MyChart) OR A paper copy in the mail If you have any lab test that is abnormal or we need to change your treatment, we will call you to review the results.  Other Instructions OK TO HOLD ASPIRIN 7 DAYS PRIOR TO YOUR PROCEDURE   OK TO HOLD ELIQUIS 3 DAYS PRIOR TO YOUR PROCEDURE  Follow-Up: At [facility], you and your health needs are our priority.  As part of our continuing mission to provide you with exceptional heart  care, we have created designated Provider Care Teams.  These Care Teams include your primary Cardiologist (physician) and Advanced Practice Providers (APPs -  Physician Assistants and Nurse Practitioners) who all work together to provide you with the care you need, when you need it.  Your next appointment:   KEEP SCHEDULED APPOINTMENT   Provider:   Pixie Casino, MD         Signed, Trudi Ida, NP  04/27/2022 9:02 AM    [place]

## 2022-04-27 ENCOUNTER — Ambulatory Visit: Payer: Medicare Other | Attending: General Practice | Admitting: Cardiology

## 2022-04-27 ENCOUNTER — Encounter: Payer: Self-pay | Admitting: Cardiology

## 2022-04-27 VITALS — BP 106/82 | HR 67 | Ht 67.0 in | Wt 189.8 lb

## 2022-04-27 DIAGNOSIS — J849 Interstitial pulmonary disease, unspecified: Secondary | ICD-10-CM

## 2022-04-27 DIAGNOSIS — Z01810 Encounter for preprocedural cardiovascular examination: Secondary | ICD-10-CM

## 2022-04-27 DIAGNOSIS — E785 Hyperlipidemia, unspecified: Secondary | ICD-10-CM

## 2022-04-27 DIAGNOSIS — I25110 Atherosclerotic heart disease of native coronary artery with unstable angina pectoris: Secondary | ICD-10-CM | POA: Diagnosis not present

## 2022-04-27 DIAGNOSIS — I480 Paroxysmal atrial fibrillation: Secondary | ICD-10-CM

## 2022-04-27 DIAGNOSIS — Z7901 Long term (current) use of anticoagulants: Secondary | ICD-10-CM

## 2022-04-27 NOTE — Patient Instructions (Signed)
Medication Instructions:  The current medical regimen is effective;  continue present plan and medications as directed. Please refer to the Current Medication list given to you today.  *If you need a refill on your cardiac medications before your next appointment, please call your pharmacy*  Lab Work: NONE If you have labs (blood work) drawn today and your tests are completely normal, you will receive your results only by:  [place] (if you have MyChart) OR A paper copy in the mail If you have any lab test that is abnormal or we need to change your treatment, we will call you to review the results.  Other Instructions OK TO HOLD ASPIRIN 7 DAYS PRIOR TO YOUR PROCEDURE   OK TO HOLD ELIQUIS 3 DAYS PRIOR TO YOUR PROCEDURE  Follow-Up: At [facility], you and your health needs are our priority.  As part of our continuing mission to provide you with exceptional heart care, we have created designated Provider Care Teams.  These Care Teams include your primary Cardiologist (physician) and Advanced Practice Providers (APPs -  Physician Assistants and Nurse Practitioners) who all work together to provide you with the care you need, when you need it.  Your next appointment:   KEEP SCHEDULED APPOINTMENT   Provider:   Pixie Casino, MD

## 2022-04-28 ENCOUNTER — Telehealth: Payer: Self-pay | Admitting: Cardiology

## 2022-04-28 NOTE — Telephone Encounter (Signed)
Per Venia Carbon, NP: Trudi Ida, NP  You1 hour ago (9:14 AM) Please contact this patient. Dr. Debara Pickett said ok to Wright Eliquis for surgery, however Dr. Myles Gip should have been the one that determined if Eliquis could be held or not. I will update the surgeons office, if you will let Charles Wright know that the below recommendations are from Dr. Myles Gip.   Ideally, he shouldn't Wright eliquis for 90 days after the ablation. He absolutely cannot Wright it for 30 days after the ablation. If it's relatively urgent, he could Wright it for 48 hours after 60 days. ---------------------------------- Pt informed of providers result & recommendations. Pt verbalized understanding. Pt is very concerned because this is scheduled for tomorrow. Informed pt that this will need to be re-scheduled. Verbalized understanding, we will need to know if this is considered urgent. Will call pt back . --------------------------------------- Pt informed of providers recommendations. Pt verbalized understanding. He will call surgeons office and have them let us know when this is scheduled.

## 2022-04-29 ENCOUNTER — Ambulatory Visit (HOSPITAL_COMMUNITY)
Admission: RE | Admit: 2022-04-29 | Discharge: 2022-04-29 | Disposition: A | Discharge status: Home / Self Care | Payer: Medicare Other | Source: Ambulatory Visit | Attending: Physician Assistant | Admitting: Physician Assistant

## 2022-04-29 ENCOUNTER — Telehealth: Payer: Self-pay | Admitting: Cardiology

## 2022-04-29 VITALS — BP 120/84 | HR 57 | Ht 67.0 in | Wt 189.6 lb

## 2022-04-29 DIAGNOSIS — E039 Hypothyroidism, unspecified: Secondary | ICD-10-CM | POA: Insufficient documentation

## 2022-04-29 DIAGNOSIS — E785 Hyperlipidemia, unspecified: Secondary | ICD-10-CM | POA: Insufficient documentation

## 2022-04-29 DIAGNOSIS — I2510 Atherosclerotic heart disease of native coronary artery without angina pectoris: Secondary | ICD-10-CM | POA: Insufficient documentation

## 2022-04-29 DIAGNOSIS — D6869 Other thrombophilia: Secondary | ICD-10-CM | POA: Diagnosis not present

## 2022-04-29 DIAGNOSIS — I480 Paroxysmal atrial fibrillation: Secondary | ICD-10-CM | POA: Diagnosis not present

## 2022-04-29 DIAGNOSIS — Z79899 Other long term (current) drug therapy: Secondary | ICD-10-CM | POA: Diagnosis not present

## 2022-04-29 DIAGNOSIS — G4733 Obstructive sleep apnea (adult) (pediatric): Secondary | ICD-10-CM | POA: Insufficient documentation

## 2022-04-29 DIAGNOSIS — Z7901 Long term (current) use of anticoagulants: Secondary | ICD-10-CM | POA: Insufficient documentation

## 2022-04-29 DIAGNOSIS — I10 Essential (primary) hypertension: Secondary | ICD-10-CM | POA: Insufficient documentation

## 2022-04-29 NOTE — Telephone Encounter (Signed)
Notified  admin to scan EKG and contact afib clinic

## 2022-04-29 NOTE — Telephone Encounter (Signed)
Afib clinic calling because pt is in the office now but they need to see his EKG from 04/27/22. She states it needs to be released to them so he doesn't have to get another done. please advise.

## 2022-04-29 NOTE — Progress Notes (Signed)
Primary Care Physician: Traci Sermon, PA-C Primary Cardiologist: Dr Debara Pickett Primary Electrophysiologist: Dr Myles Gip Referring Physician: Dr Glenard Haring is a 87 y.o. male with a history of CAD, Gilbert's syndrome, HLD, HTN, hypothyroidism, prostate cancer, atrial fibrillation who presents for follow up in the [facility].  The patient was initially diagnosed with atrial fibrillation in 2020 and was managed with amiodarone. He was diagnosed with IPF and is no longer taking it. The IPF may have been due to [place]. He was seen by Dr Myles Gip and underwent afib ablation on 04/01/22. Patient is on Eliquis for a CHADS2VASC score of 6.  On follow up today, patient did have throat soreness and difficulty swallowing for about one week post ablation but this has resolved. He has not had any afib symptoms post ablation. He denies chest pain or groin issues. He is pending a cochlear implant, hopefully at the end of May.   Today, he denies symptoms of palpitations, chest pain, shortness of breath, orthopnea, PND, lower extremity edema, dizziness, presyncope, syncope, snoring, daytime somnolence, bleeding, or neurologic sequela. The patient is tolerating medications without difficulties and is otherwise without complaint today.    Atrial Fibrillation Risk Factors:  he does have symptoms or diagnosis of sleep apnea. he does not have a history of rheumatic fever.   he has a BMI of Body mass index is 29.7 kg/m.Marland Kitchen Filed Weights   04/29/22 1022  Weight: 86 kg    Family History  Problem Relation Age of Onset   Leukemia Mother    Heart attack Father    Cancer Sister        breast   Hypertension Neg Hx    Stroke Neg Hx    Colon cancer Neg Hx    Esophageal cancer Neg Hx    Rectal cancer Neg Hx    Stomach cancer Neg Hx      Atrial Fibrillation Management history:  Previous antiarrhythmic drugs: amiodarone  Previous cardioversions: none Previous ablations:  04/01/22 CHADS2VASC score: 6 Anticoagulation history: Eliquis   Past Medical History:  Diagnosis Date   Acute gout of left ankle    06-14-2015   Bladder cancer (HCC)    BCG's tx's   BPH (benign prostatic hypertrophy)    CAD (coronary artery disease) CARDIOLOGIST-  DR TILLEY   a. NSTEMI 12/2014 -  99% dLAD-2 (not amenable to PCI), 50% dLAD-1, 60% mLAD. Medical therapy was recommended.   Cancer of kidney (HCC)    left   Cough    HARSH NONPRODUCTIVE COUGH 1 AND 1/2 WEEKS   GERD (gastroesophageal reflux disease)    takes  OTC periodically   Gilbert's syndrome    H/O cardiac catheterization    a. Note: Difficult radial access in 12/2014 - recommend femoral approach if cath needed in the future.   History of kidney stones    History of non-ST elevation myocardial infarction (NSTEMI)    12-12-2014   CARDIAC CATH W/ NO INTERVENTION X 2FEW DAYS APART   History of spinal fracture 2002   LUMBAR   Hyperlipidemia    Hypertension    Hypothyroidism    Myocardial infarction (HCC)    OSA on CPAP    SET ON 7 USES SOME NIGHTS   Paroxysmal A-fib [facility])    cardiologist-  dr Wynonia Lawman   Prostate cancer [facility]) 2019   last PSA  2.9  (montiored by urologist  dr Junious Silk) [place] FEB 2019 RAD DONE  Shingles    2 weeks ago   Wears glasses    Past Surgical History:  Procedure Laterality Date   ATRIAL FIBRILLATION ABLATION N/A 04/01/2022   Procedure: ATRIAL FIBRILLATION ABLATION;  Surgeon: Melida Quitter, MD;  Location: [place] [facility];  Service: Cardiovascular;  Laterality: N/A;   BACK SURGERY  2014   LOWER l 1, 2 4 AND 5   BILATERAL INGUINAL HERNIA REPAIR     CARDIAC CATHETERIZATION N/A 12/13/2014   Procedure: Left Heart Cath and Coronary Angiography;  Surgeon: Leonie Man, MD;  Location: [place] [facility];  Service: Cardiovascular;  Laterality: N/A;  culprit lesion diat LAD-2  99%, not approachable via PCTA  due to extremely tortuous up stream LAD/  mLAD 60% and dLAD-1 50%, both are at  the extremely tortuous segment and not PCI targets/  otherwise normal coronary arteries   COLONOSCOPY WITH PROPOFOL N/A 01/16/2019   Procedure: COLONOSCOPY WITH PROPOFOL;  Surgeon: Clarene Essex, MD;  Location: [facility];  Service: Endoscopy;  Laterality: N/A;   CYSTOSCOPY W/ RETROGRADES Left 08/22/2012   Procedure: CYSTOSCOPY WITH RETROGRADE PYELOGRAM;  left kidney washings;  Surgeon: Fredricka Bonine, MD;  Location: [facility];  Service: Urology;  Laterality: Left;   CYSTOSCOPY W/ RETROGRADES N/A 07/08/2015   Procedure: CYSTOSCOPY WITH RETROGRADE PYELOGRAM;  Surgeon: [name], MD;  Location[facility];  Service: Urology;  Laterality: N/A;   CYSTOSCOPY W/ URETERAL STENT PLACEMENT Left 03/24/2019   Procedure: CYSTOSCOPY WITH RETROGRADE PYELOGRAM/URETERAL STENT PLACEMENT ureteroscopy biopsy of neoplasm;  Surgeon: [name], MD;  Location: [facility];  Service: Urology;  Laterality: Left;   CYSTOSCOPY W/ URETERAL STENT PLACEMENT Left 04/08/2019   Procedure: CYSTOSCOPY cystogram clot evacuation left ureteroscopy clot evacuation left stent exchange liimited transuretheral resectio of prastate and fulguration  bleeders of prostate;  Surgeon: [name], MD;  Location: [facility];  Service: Urology;  Laterality: Left;   CYSTOSCOPY WITH BIOPSY  08/22/2012   Procedure: CYSTOSCOPY WITH BIOPSY;  Surgeon: Fredricka Bonine, MD;  Location: [facility];  Service: Urology;;   CYSTOSCOPY WITH BIOPSY N/A 11/08/2014   Procedure: CYSTOSCOPY/BIOPSPY BLADDER INSTILLATION OF MARCAINE AND PYRIDIUM;  Surgeon: [name], MD;  Location: [facility];  Service: Urology;  Laterality: N/A;   CYSTOSCOPY WITH BIOPSY N/A 07/08/2015   Procedure: CYSTOSCOPY WITH BLADDER BIOPSY, FULGURATION, RETROGRADE PYLOGRAM AND RENAL WASHING;  Surgeon: [name], MD;  Location: [facility];  Service: Urology;  Laterality: N/A;    CYSTOSCOPY WITH RETROGRADE PYELOGRAM, URETEROSCOPY AND STENT PLACEMENT Bilateral 07/30/2014   Procedure: CYSTOSCOPY WITH BLADDER BX FULERGATION AND BILATERAL RETROGRADE PYELOGRAM,;  Surgeon: [name], MD;  [facility];  Service: Urology;  Laterality: Bilateral;   CYSTOSCOPY WITH STENT PLACEMENT Left 03/24/2018   Procedure: STENT PLACEMENT AND BIOPSY;  Surgeon: [name], MD;  Location: [facility];  Service: Urology;  Laterality: Left;   CYSTOSCOPY/RETROGRADE/URETEROSCOPY Bilateral 08/17/2013   Procedure: CYSTOSCOPY, BLADDER BIOPSY WITH BILATERAL RETROGRADE PYELOGRAM, LEFT URETEROSCOPY AND DILATION OF STRICTURE ;  Surgeon: [name], MD;  Location: [facility];  Service: Urology;  Laterality: Bilateral;   CYSTOSCOPY/RETROGRADE/URETEROSCOPY Left 03/24/2018   Procedure: CYSTOSCOPY/RETROGRADE/URETEROSCOPY;  Surgeon: [name], MD;  Location: [facility];  Service: Urology;  Laterality: Left;   HOT HEMOSTASIS N/A 01/16/2019   Procedure: HOT HEMOSTASIS (ARGON PLASMA COAGULATION/BICAP);  Surgeon: Clarene Essex, MD;  Location: [facility];  Service: Endoscopy;  Laterality: N/A;   INSERTION OF  MESH N/A 08/20/2020   Procedure: INSERTION OF MESH;  Surgeon: Ralene Ok, MD;  Location: [place];  Service: General;  Laterality: N/A;   LEFT ATRIAL APPENDAGE OCCLUSION N/A 01/09/2015   Procedure: LEFT ATRIAL APPENDAGE OCCLUSION;  Surgeon: [name], MD;  Location: [place] [facility];  Service: Cardiovascular;  Laterality: N/A;   LEFT HEART CATH AND CORONARY ANGIOGRAPHY N/A 11/20/2021   Procedure: LEFT HEART CATH AND CORONARY ANGIOGRAPHY;  Surgeon: Sherren Mocha, MD;  Location: [place] [facility];  Service: Cardiovascular;  Laterality: N/A;   LUMBAR LAMINECTOMY/DECOMPRESSION MICRODISCECTOMY Left 12/03/2013   Procedure: Left Lumbar three-four diskectomy with Lumbar four laminectomy ;  Surgeon: Newman Pies, MD;  Location: [place] [facility];   Service: Neurosurgery;  Laterality: Left;  Left Lumbar three-four diskectomy with Lumbar four laminectomy    LYMPH NODE DISSECTION Bilateral 04/13/2019   Procedure: LYMPH NODE DISSECTION;  Surgeon: Alexis Frock, MD;  [facility];  Service: Urology;  Laterality: Bilateral;   ORIF LEFT ANKLE FX  2005   POLYPECTOMY  01/16/2019   Procedure: POLYPECTOMY;  Surgeon: Clarene Essex, MD;  [facility];  Service: Endoscopy;;   PROSTATE BIOPSY N/A 08/22/2012   Procedure: BIOPSY TRANSRECTAL ULTRASONIC PROSTATE (TUBP);  Surgeon: Fredricka Bonine, MD;  Location: [facility];  Service: Urology;  Laterality: N/A;   ROBOT ASSITED LAPAROSCOPIC NEPHROURETERECTOMY Left 04/13/2019   Procedure: XI ROBOT ASSITED LAPAROSCOPIC NEPHROURETERECTOMY;  Surgeon: Alexis Frock, MD;  Location[facility];  Service: Urology;  Laterality: Left;  3 HRS   TEE WITHOUT CARDIOVERSION N/A 12/31/2014   Procedure: TRANSESOPHAGEAL ECHOCARDIOGRAM (TEE);  Surgeon: Pixie Casino, MD;  Location: [facility];  Service: Cardiovascular;  Laterality: N/A;  ef 55-60%/  mild MR, TR, and PR/  mild LAE and RAE   TEE WITHOUT CARDIOVERSION N/A 04/01/2022   Procedure: TRANSESOPHAGEAL ECHOCARDIOGRAM;  Surgeon: Melida Quitter, MD;  Location: [place] [facility];  Service: Cardiovascular;  Laterality: N/A;   TRANSURETHRAL RESECTION OF BLADDER TUMOR  10/22/2011   Procedure: TRANSURETHRAL RESECTION OF BLADDER TUMOR (TURBT);  Surgeon: Fredricka Bonine, MD;  Location: [facility];  Service: Urology;  Laterality: N/A;  TURBT, LEFT URETEROSCOPY, POSSIBLE URETERAL STENT    URETEROSCOPY  10/22/2011   Procedure: URETEROSCOPY;  Surgeon: Fredricka Bonine, MD;  Location: [facility];  Service: Urology;  Laterality: Left;   WISDOM TOOTH EXTRACTION     XI ROBOTIC ASSISTED VENTRAL HERNIA N/A 08/20/2020   Procedure: ROBOTIC INCISIONAL HERNIA REPAIR WITH MESH;  Surgeon: Ralene Ok, MD;   Location: [place];  Service: General;  Laterality: N/A;    Current Outpatient Medications  Medication Sig Dispense Refill   acetaminophen (TYLENOL) 500 MG tablet Take 1,000 mg by mouth every 6 (six) hours as needed for moderate pain or headache.     allopurinol (ZYLOPRIM) 300 MG tablet Take 300 mg by mouth every morning.      aspirin EC 81 MG tablet Take 81 mg by mouth every evening.     Cholecalciferol (VITAMIN D-3) 25 MCG (1000 UT) CAPS Take 2,000-3,000 Units by mouth See admin instructions.     doxycycline (MONODOX) 50 MG capsule Take 50 mg by mouth daily as needed (rosacea).     ELIQUIS 2.5 MG TABS tablet TAKE 1 TABLET BY MOUTH TWICE A DAY 180 tablet 1   EPINEPHrine 0.3 mg/0.3 mL IJ SOAJ injection Inject 0.3 mLs (0.3 mg total) into the muscle once. (Patient taking differently: Inject 0.3 mg into the muscle as  needed for anaphylaxis.) 2 Device 1   GLUCOSAMINE SULFATE PO Take 750 mg by mouth daily.     ipratropium (ATROVENT) 0.03 % nasal spray Place 1 spray into both nostrils 2 (two) times daily.     levothyroxine (SYNTHROID, LEVOTHROID) 50 MCG tablet Take 50 mcg by mouth daily before breakfast.     metoprolol tartrate (LOPRESSOR) 50 MG tablet Take 1 tablet (50 mg total) by mouth 2 (two) times daily. Take additional half tablet as needed for for palpitations. 180 tablet 3   nitroGLYCERIN (NITROSTAT) 0.4 MG SL tablet Place 1 tablet (0.4 mg total) under the tongue every 5 (five) minutes x 3 doses as needed for chest pain. 25 tablet 12   rosuvastatin (CRESTOR) 20 MG tablet Take 1 tablet (20 mg total) by mouth daily. 90 tablet 3   Testosterone 1.62 % GEL Apply 2 Pump topically every other day. Applied to shoulder area     pantoprazole (PROTONIX) 40 MG tablet Take 1 tablet (40 mg total) by mouth daily. (Patient not taking: Reported on 04/29/2022) 30 tablet 0   No current facility-administered medications for this encounter.    Allergies  Allergen Reactions   Bee Venom Anaphylaxis    Actually  Hornets and Wasps.    Lisinopril Cough   Losartan Cough        Oxycodone Other (See Comments)    ALTERED MENTAL STATUS GETS MEAN    Social History   Socioeconomic History   Marital status: Married    Spouse name: Not on file   Number of children: 2   Years of education: Not on file   Highest education level: Not on file  Occupational History   Occupation: Retired    Fish farm manager: OTHER  Tobacco Use   Smoking status: Never   Smokeless tobacco: Never  Vaping Use   Vaping Use: Never used  Substance and Sexual Activity   Alcohol use: Yes    Alcohol/week: 7.0 standard drinks of alcohol    Types: 7 Glasses of wine per week    Comment: red wine glass daily or shot of whiskey   Drug use: No   Sexual activity: Yes  Other Topics Concern   Not on file  Social History Narrative   Not on file   Social Determinants of Health   Financial Resource Strain: Not on file  Food Insecurity: Not on file  Transportation Needs: Not on file  Physical Activity: Not on file  Stress: Not on file  Social Connections: Not on file  Intimate Partner Violence: Not on file     ROS- All systems are reviewed and negative except as per the HPI above.  Physical Exam: Vitals:   04/29/22 1022  BP: 120/84  Pulse: (!) 57  SpO2: 97%  Weight: 86 kg  Height: 5\' 7"  (1.702 m)    GEN- The patient is a well appearing elderly male, alert and oriented x 3 today.   Head- normocephalic, atraumatic Eyes-  Sclera clear, conjunctiva pink Ears- hearing intact Oropharynx- clear Neck- supple  Lungs- Clear to ausculation bilaterally, normal work of breathing Heart- Regular rate and rhythm, no murmurs, rubs or gallops  GI- soft, NT, ND, + BS Extremities- no clubbing, cyanosis, or edema MS- no significant deformity or atrophy Skin- no rash or lesion Psych- euthymic mood, full affect Neuro- strength and sensation are intact  Wt Readings from Last 3 Encounters:  04/29/22 86 kg  04/27/22 86.1 kg  04/01/22  83.9 kg    EKG is not  ordered today.  ECG from 04/27/22 reviewed.    Echo 04/30/21 demonstrated   1. Left ventricular ejection fraction, by estimation, is 60 to 65%. The  left ventricle has normal function. The left ventricle has no regional  wall motion abnormalities. Left ventricular diastolic parameters are  indeterminate.   2. Right ventricular systolic function is normal. The right ventricular  size is normal. There is normal pulmonary artery systolic pressure.   3. Left atrial size was moderately dilated.   4. The mitral valve is normal in structure. Trivial mitral valve  regurgitation. No evidence of mitral stenosis.   5. The aortic valve is grossly normal. There is moderate calcification of  the aortic valve. There is mild thickening of the aortic valve. Aortic  valve regurgitation is trivial. Aortic valve sclerosis/calcification is  present, without any evidence of  aortic stenosis.   6. The inferior vena cava is normal in size with greater than 50%  respiratory variability, suggesting right atrial pressure of 3 mmHg.   Comparison(s): Prior images unable to be directly viewed, comparison made  by report only. EF 60%, mild-mod LVH.   Conclusion(s)/Recommendation(s): Otherwise normal echocardiogram, with  minor abnormalities described in the report.    Epic records are reviewed at length today  CHA2DS2-VASc Score = 6  The patient's score is based upon: CHF History: 0 HTN History: 1 Diabetes History: 0 Stroke History: 2 Vascular Disease History: 1 Age Score: 2 Gender Score: 0       ASSESSMENT AND PLAN: 1. Paroxysmal Atrial Fibrillation (ICD10:  I48.0) The patient's CHA2DS2-VASc score is 6, indicating a 9.7% annual risk of stroke.   S/p afib ablation 04/01/22 Patient appears to be maintaining SR.  Continue Eliquis 2.5 mg BID (age, Cr >1.5 baseline) with no missed doses for 3 months post ablation.  Continue Lopressor 50 mg BID  2. Secondary Hypercoagulable State  (ICD10:  D68.69) The patient is at significant risk for stroke/thromboembolism based upon his CHA2DS2-VASc Score of 6.  Continue Apixaban (Eliquis).   3. CAD No anginal symptoms.  4. HTN Stable, no changes today.   5. OSA Encouraged compliance with CPAP therapy.   Follow up with Dr Myles Gip as scheduled.    [facility] [address] [place], [place] [address] [phone] 04/29/2022 11:05 AM

## 2022-05-03 DIAGNOSIS — Z8551 Personal history of malignant neoplasm of bladder: Secondary | ICD-10-CM | POA: Diagnosis not present

## 2022-05-06 ENCOUNTER — Telehealth: Payer: Self-pay | Admitting: Internal Medicine

## 2022-05-06 NOTE — Telephone Encounter (Signed)
.  Pt c/o BP issue: STAT if pt c/o blurred vision, one-sided weakness or slurred speech  1. What are your last 5 BP readings? 98/51, 106/56 this morning, yesterday  113/62, 109/55, 101/46, 101/58  2. Are you having any other symptoms (ex. Dizziness, headache, blurred vision, passed out)? Lightheaded  3. What is your BP issue? Patient states his BP have been very low lately and is not sure if his metoprolol needs to be adjusted.

## 2022-05-06 NOTE — Telephone Encounter (Signed)
Patient ask to reduce metoprolol.  He was increased to 50 mg BID for Afib.  He has had no issues since ablation. Does not take the PRN dose. Heart rate stays 60-7-'s.  However, his BP has been dropping whenever he bends over or does any activity.  Readings are currently 98/51 This morning 106/56.   yesterday 113/62, 109/55, 101/46, 101/58 .   Please advise

## 2022-05-10 ENCOUNTER — Telehealth: Payer: Self-pay | Admitting: Internal Medicine

## 2022-05-10 MED ORDER — METOPROLOL TARTRATE 50 MG PO TABS: 25.0000 mg | ORAL_TABLET | Freq: Two times a day (BID) | ORAL | Status: DC

## 2022-05-10 NOTE — Telephone Encounter (Signed)
Addressed in another phone note encounter

## 2022-05-10 NOTE — Telephone Encounter (Signed)
Pt c/o medication issue:  1. Name of Medication:  metoprolol tartrate (LOPRESSOR) 50 MG tablet  2. How are you currently taking this medication (dosage and times per day)?   3. Are you having a reaction (difficulty breathing--STAT)?   4. What is your medication issue?  Patient states his BP has been lower than normal and got down to 100/50, so he started taking 1/2 tablet twice daily instead of 1 tablet twice daily and today his BP is 112/67.

## 2022-05-10 NOTE — Telephone Encounter (Signed)
Spoke to patient, patient reports decreasing metoprolol to 25 mg BID since Friday and is feeling much better (see phone note 3/28).  He states prior to this, his BP was running 100/50s and was lightheaded.  He reports since decreasing, lightheadedness has resolved.  BP is 112/67, HR 65-70.    He wanted confirmation this is okay to continue at this dose.    Advised to continue as taking and would send to MD to review.  Will contact patient with any additional recommendations.

## 2022-05-10 NOTE — Telephone Encounter (Signed)
Med list updated

## 2022-05-13 ENCOUNTER — Other Ambulatory Visit (HOSPITAL_BASED_OUTPATIENT_CLINIC_OR_DEPARTMENT_OTHER): Payer: Self-pay | Admitting: Family

## 2022-05-13 NOTE — Telephone Encounter (Signed)
Rx(s) sent to pharmacy electronically.  

## 2022-05-28 ENCOUNTER — Other Ambulatory Visit (HOSPITAL_BASED_OUTPATIENT_CLINIC_OR_DEPARTMENT_OTHER): Payer: Self-pay | Admitting: Family

## 2022-05-28 NOTE — Telephone Encounter (Signed)
Rx(s) sent to pharmacy electronically.  

## 2022-06-07 DIAGNOSIS — M109 Gout, unspecified: Secondary | ICD-10-CM | POA: Diagnosis not present

## 2022-06-07 DIAGNOSIS — I700 Atherosclerosis of aorta: Secondary | ICD-10-CM | POA: Diagnosis not present

## 2022-06-07 DIAGNOSIS — Z0000 Encounter for general adult medical examination without abnormal findings: Secondary | ICD-10-CM | POA: Diagnosis not present

## 2022-06-07 DIAGNOSIS — N1831 Chronic kidney disease, stage 3a: Secondary | ICD-10-CM | POA: Diagnosis not present

## 2022-06-07 DIAGNOSIS — D6869 Other thrombophilia: Secondary | ICD-10-CM | POA: Diagnosis not present

## 2022-06-07 DIAGNOSIS — J849 Interstitial pulmonary disease, unspecified: Secondary | ICD-10-CM | POA: Diagnosis not present

## 2022-06-07 DIAGNOSIS — E782 Mixed hyperlipidemia: Secondary | ICD-10-CM | POA: Diagnosis not present

## 2022-06-07 DIAGNOSIS — I480 Paroxysmal atrial fibrillation: Secondary | ICD-10-CM | POA: Diagnosis not present

## 2022-06-07 DIAGNOSIS — E039 Hypothyroidism, unspecified: Secondary | ICD-10-CM | POA: Diagnosis not present

## 2022-06-29 ENCOUNTER — Ambulatory Visit: Payer: Medicare Other | Attending: Cardiovascular Disease | Admitting: Cardiovascular Disease

## 2022-06-29 ENCOUNTER — Encounter: Payer: Self-pay | Admitting: Cardiovascular Disease

## 2022-06-29 VITALS — BP 112/68 | HR 50 | Ht 67.0 in | Wt 187.8 lb

## 2022-06-29 DIAGNOSIS — I4892 Unspecified atrial flutter: Secondary | ICD-10-CM

## 2022-06-29 DIAGNOSIS — I480 Paroxysmal atrial fibrillation: Secondary | ICD-10-CM

## 2022-06-29 MED ORDER — APIXABAN 5 MG PO TABS: 5.0000 mg | ORAL_TABLET | Freq: Two times a day (BID) | ORAL | 11 refills | Status: DC

## 2022-06-29 NOTE — Progress Notes (Signed)
Electrophysiology Office Note:    Date:  06/29/2022   ID:  Charles Wright, DOB 1935-12-09, MRN [id number]  PCP:  Tamala Ser   [facility] Providers Cardiologist:  Chrystie Nose, MD Electrophysiologist:  Maurice Small, MD     Referring MD: No ref. provider found   Chief complaint: palpitations, fatigu  History of Present Illness:    Charles Wright is a 87 y.o. male with a hx of CAD and NSTEMI in 2016, bladder cancer, prostate cancer, HLD, HTN, OSA referred by dr. Rennis Golden for AF management.  He was diagnosed with AF sometime around 2020. He was managed for a while with amiodarone. He doesn't particularly recall how effective it was for him, but he was diagnosed with IPF and is no longer taking it. The IPF may have been due to COVID.  He has episodes of palpitations. These occur most frequently at night and awake him from sleep. Episodes sometimes occur when he is working in the yard. When this happens, he has to go inside and rest. He does not have chest pain, orthopnea, PND. He has a diagnosis of OSA but could not tolerate CPAP. Since being diagnosed, he lost a significant amount of weight.  He underwent ablation for AF with a WACA on Apr 01, 2022. The procedure was uncomplicated. He feels better since the ablation. He has noted swings in his blood pressure, at times with significant malaise. These are not necessarily associated with fluctuations in his heart rate.  Past Medical History:  Diagnosis Date   Acute gout of left ankle    06-14-2015   Bladder cancer (HCC)    BCG's tx's   BPH (benign prostatic hypertrophy)    CAD (coronary artery disease) CARDIOLOGIST-  DR TILLEY   a. NSTEMI 12/2014 -  99% dLAD-2 (not amenable to PCI), 50% dLAD-1, 60% mLAD. Medical therapy was recommended.   Cancer of kidney (HCC)    left   Cough    HARSH NONPRODUCTIVE COUGH 1 AND 1/2 WEEKS   GERD (gastroesophageal reflux disease)    takes  OTC periodically   Gilbert's  syndrome    H/O cardiac catheterization    a. Note: Difficult radial access in 12/2014 - recommend femoral approach if cath needed in the future.   History of kidney stones    History of non-ST elevation myocardial infarction (NSTEMI)    12-12-2014   CARDIAC CATH W/ NO INTERVENTION X 2FEW DAYS APART   History of spinal fracture 2002   LUMBAR   Hyperlipidemia    Hypertension    Hypothyroidism    Myocardial infarction (HCC)    OSA on CPAP    SET ON 7 USES SOME NIGHTS   Paroxysmal A-fib [facility])    cardiologist-  dr Donnie Aho   Prostate cancer [facility]) 2019   last PSA  2.9  (montiored by urologist  dr Mena Goes) Vanessa Kick AND FEB 2019 RAD DONE   Shingles    2 weeks ago   Wears glasses     Past Surgical History:  Procedure Laterality Date   ATRIAL FIBRILLATION ABLATION N/A 04/01/2022   Procedure: ATRIAL FIBRILLATION ABLATION;  Surgeon: Maurice Small, MD;  [facility];  Service: Cardiovascular;  Laterality: N/A;   BACK SURGERY  2014   LOWER l 1, 2 4 AND 5   BILATERAL INGUINAL HERNIA REPAIR     CARDIAC CATHETERIZATION N/A 12/13/2014   Procedure: Left Heart Cath and Coronary Angiography;  Surgeon: Piedad Climes  Herbie Baltimore, MD;  [facility]: [facility];  Service: Cardiovascular;  Laterality: N/A;  culprit lesion diat LAD-2  99%, not approachable via PCTA  due to extremely tortuous up stream LAD/  mLAD 60% and dLAD-1 50%, both are at the extremely tortuous segment and not PCI targets/  otherwise normal coronary arteries   COLONOSCOPY WITH PROPOFOL N/A 01/16/2019   Procedure: COLONOSCOPY WITH PROPOFOL;  Surgeon: Vida Rigger, MD;  Location: [facility];  Service: Endoscopy;  Laterality: N/A;   CYSTOSCOPY W/ RETROGRADES Left 08/22/2012   Procedure: CYSTOSCOPY WITH RETROGRADE PYELOGRAM;  left kidney washings;  Surgeon: Antony Haste, MD;  Location: [facility];  Service: Urology;  Laterality: Left;   CYSTOSCOPY W/ RETROGRADES N/A 07/08/2015   Procedure: CYSTOSCOPY WITH  RETROGRADE PYELOGRAM;  Surgeon: Jerilee Field, MD;  Location: [facility];  Service: Urology;  Laterality: N/A;   CYSTOSCOPY W/ URETERAL STENT PLACEMENT Left 03/24/2019   Procedure: CYSTOSCOPY WITH RETROGRADE PYELOGRAM/URETERAL STENT PLACEMENT ureteroscopy biopsy of neoplasm;  Surgeon: Jerilee Field, MD;  Location: [facility];  Service: Urology;  Laterality: Left;   CYSTOSCOPY W/ URETERAL STENT PLACEMENT Left 04/08/2019   Procedure: CYSTOSCOPY cystogram clot evacuation left ureteroscopy clot evacuation left stent exchange liimited transuretheral resectio of prastate and fulguration  bleeders of prostate;  Surgeon: Jerilee Field, MD;  Location: [facility];  Service: Urology;  Laterality: Left;   CYSTOSCOPY WITH BIOPSY  08/22/2012   Procedure: CYSTOSCOPY WITH BIOPSY;  Surgeon: Antony Haste, MD;  Location: [facility];  Service: Urology;;   CYSTOSCOPY WITH BIOPSY N/A 11/08/2014   Procedure: CYSTOSCOPY/BIOPSPY BLADDER INSTILLATION OF MARCAINE AND PYRIDIUM;  Surgeon: Jerilee Field, MD;  Location: [facility];  Service: Urology;  Laterality: N/A;   CYSTOSCOPY WITH BIOPSY N/A 07/08/2015   Procedure: CYSTOSCOPY WITH BLADDER BIOPSY, FULGURATION, RETROGRADE PYLOGRAM AND RENAL WASHING;  Surgeon: Jerilee Field, MD;  Location: [facility];  Service: Urology;  Laterality: N/A;   CYSTOSCOPY WITH RETROGRADE PYELOGRAM, URETEROSCOPY AND STENT PLACEMENT Bilateral 07/30/2014   Procedure: CYSTOSCOPY WITH BLADDER BX FULERGATION AND BILATERAL RETROGRADE PYELOGRAM,;  Surgeon: Jerilee Field, MD;  [facility];  Service: Urology;  Laterality: Bilateral;   CYSTOSCOPY WITH STENT PLACEMENT Left 03/24/2018   Procedure: STENT PLACEMENT AND BIOPSY;  Surgeon: Jerilee Field, MD;  [facility];  Service: Urology;  Laterality: Left;   CYSTOSCOPY/RETROGRADE/URETEROSCOPY Bilateral 08/17/2013   Procedure: CYSTOSCOPY, BLADDER BIOPSY  WITH BILATERAL RETROGRADE PYELOGRAM, LEFT URETEROSCOPY AND DILATION OF STRICTURE ;  Surgeon: Jerilee Field, MD;  Location: [facility];  Service: Urology;  Laterality: Bilateral;   CYSTOSCOPY/RETROGRADE/URETEROSCOPY Left 03/24/2018   Procedure: CYSTOSCOPY/RETROGRADE/URETEROSCOPY;  Surgeon: Jerilee Field, MD;  Location: [facility];  Service: Urology;  Laterality: Left;   HOT HEMOSTASIS N/A 01/16/2019   Procedure: HOT HEMOSTASIS (ARGON PLASMA COAGULATION/BICAP);  Surgeon: Vida Rigger, MD;  Location: [facility];  Service: Endoscopy;  Laterality: N/A;   INSERTION OF MESH N/A 08/20/2020   Procedure: INSERTION OF MESH;  Surgeon: Axel Filler, MD;  Location: [facility];  Service: General;  Laterality: N/A;   LEFT ATRIAL APPENDAGE OCCLUSION N/A 01/09/2015   Procedure: LEFT ATRIAL APPENDAGE OCCLUSION;  Surgeon: Hillis Range, MD;  [facility];  Service: Cardiovascular;  Laterality: N/A;   LEFT HEART CATH AND CORONARY ANGIOGRAPHY N/A 11/20/2021   Procedure: LEFT HEART CATH AND CORONARY ANGIOGRAPHY;  Surgeon: Tonny Bollman, MD;  Location: [facility]VASIVE CV LAB;  Service: Cardiovascular;  Laterality: N/A;  LUMBAR LAMINECTOMY/DECOMPRESSION MICRODISCECTOMY Left 12/03/2013   Procedure: Left Lumbar three-four diskectomy with Lumbar four laminectomy ;  Surgeon: Tressie Stalker, MD;  [facility];  Service: Neurosurgery;  Laterality: Left;  Left Lumbar three-four diskectomy with Lumbar four laminectomy    LYMPH NODE DISSECTION Bilateral 04/13/2019   Procedure: LYMPH NODE DISSECTION;  Surgeon: Sebastian Ache, MD;  [facility];  Service: Urology;  Laterality: Bilateral;   ORIF LEFT ANKLE FX  2005   POLYPECTOMY  01/16/2019   Procedure: POLYPECTOMY;  Surgeon: Vida Rigger, MD;  [facility];  Service: Endoscopy;;   PROSTATE BIOPSY N/A 08/22/2012   Procedure: BIOPSY TRANSRECTAL ULTRASONIC PROSTATE (TUBP);  Surgeon: Antony Haste, MD;   Location: [facility];  Service: Urology;  Laterality: N/A;   ROBOT ASSITED LAPAROSCOPIC NEPHROURETERECTOMY Left 04/13/2019   Procedure: XI ROBOT ASSITED LAPAROSCOPIC NEPHROURETERECTOMY;  Surgeon: Sebastian Ache, MD;  Location[facility];  Service: Urology;  Laterality: Left;  3 HRS   TEE WITHOUT CARDIOVERSION N/A 12/31/2014   Procedure: TRANSESOPHAGEAL ECHOCARDIOGRAM (TEE);  Surgeon: Chrystie Nose, MD;  Location: [facility]COPY;  Service: Cardiovascular;  Laterality: N/A;  ef 55-60%/  mild MR, TR, and PR/  mild LAE and RAE   TEE WITHOUT CARDIOVERSION N/A 04/01/2022   Procedure: TRANSESOPHAGEAL ECHOCARDIOGRAM;  Surgeon: Maurice Small, MD;  [facility];  Service: Cardiovascular;  Laterality: N/A;   TRANSURETHRAL RESECTION OF BLADDER TUMOR  10/22/2011   Procedure: TRANSURETHRAL RESECTION OF BLADDER TUMOR (TURBT);  Surgeon: Antony Haste, MD;  Location: [facility];  Service: Urology;  Laterality: N/A;  TURBT, LEFT URETEROSCOPY, POSSIBLE URETERAL STENT    URETEROSCOPY  10/22/2011   Procedure: URETEROSCOPY;  Surgeon: Antony Haste, MD;  Location: [facility];  Service: Urology;  Laterality: Left;   WISDOM TOOTH EXTRACTION     XI ROBOTIC ASSISTED VENTRAL HERNIA N/A 08/20/2020   Procedure: ROBOTIC INCISIONAL HERNIA REPAIR WITH MESH;  Surgeon: Axel Filler, MD;  [facility];  Service: General;  Laterality: N/A;    Current Medications: Current Meds  Medication Sig   acetaminophen (TYLENOL) 500 MG tablet Take 1,000 mg by mouth every 6 (six) hours as needed for moderate pain or headache.   allopurinol (ZYLOPRIM) 300 MG tablet Take 300 mg by mouth every morning.    aspirin EC 81 MG tablet Take 81 mg by mouth every evening.   Cholecalciferol (VITAMIN D-3) 25 MCG (1000 UT) CAPS Take 2,000-3,000 Units by mouth See admin instructions.   doxycycline (MONODOX) 50 MG capsule Take 50 mg by mouth daily as needed (rosacea).    ELIQUIS 2.5 MG TABS tablet TAKE 1 TABLET BY MOUTH TWICE A DAY   EPINEPHrine 0.3 mg/0.3 mL IJ SOAJ injection Inject 0.3 mLs (0.3 mg total) into the muscle once. (Patient taking differently: Inject 0.3 mg into the muscle as needed for anaphylaxis.)   GLUCOSAMINE SULFATE PO Take 750 mg by mouth daily.   ipratropium (ATROVENT) 0.03 % nasal spray Place 1 spray into both nostrils 2 (two) times daily.   levothyroxine (SYNTHROID, LEVOTHROID) 50 MCG tablet Take 50 mcg by mouth daily before breakfast.   metoprolol tartrate (LOPRESSOR) 25 MG tablet Take 1 tablet (25 mg total) by mouth 2 (two) times daily.   nitroGLYCERIN (NITROSTAT) 0.4 MG SL tablet Place 1 tablet (0.4 mg total) under the tongue every 5 (five) minutes x 3 doses as needed for chest pain.   rosuvastatin (CRESTOR) 20 MG tablet TAKE ONE TABLET BY  MOUTH DAILY   Testosterone 1.62 % GEL Apply 2 Pump topically every other day. Applied to shoulder area     Allergies:   Bee venom, Lisinopril, Losartan, and Oxycodone   Social History   Socioeconomic History   Marital status: Married    Spouse name: Not on file   Number of children: 2   Years of education: Not on file   Highest education level: Not on file  Occupational History   Occupation: Retired    Associate Professor: [employer]  Tobacco Use   Smoking status: Never   Smokeless tobacco: Never  Vaping Use   Vaping Use: Never used  Substance and Sexual Activity   Alcohol use: Yes    Alcohol/week: 7.0 standard drinks of alcohol    Types: 7 Glasses of wine per week    Comment: red wine glass daily or shot of whiskey   Drug use: No   Sexual activity: Yes  Other Topics Concern   Not on file  Social History Narrative   Not on file   Social Determinants of Health   Financial Resource Strain: Not on file  Food Insecurity: Not on file  Transportation Needs: Not on file  Physical Activity: Not on file  Stress: Not on file  Social Connections: Not on file     Family History: The patient's  family history includes Cancer in his sister; Heart attack in his father; Leukemia in his mother. There is no history of Hypertension, Stroke, Colon cancer, Esophageal cancer, Rectal cancer, or Stomach cancer.  ROS:   Please see the history of present illness.    All other systems reviewed and are negative.  EKGs/Labs/Other Studies Reviewed Today:     EKG:  Last EKG results: sinus rhythm, rate 61 bpm   Recent Labs: 09/01/2021: ALT 17 03/11/2022: BUN 19; Creatinine, Ser 1.35; Hemoglobin 16.3; Platelets 264; Potassium 5.3; Sodium 140     Physical Exam:    VS:  BP 112/68   Pulse (!) 50   Ht 5\' 7"  (1.702 m)   Wt 187 lb 12.8 oz (85.2 kg)   SpO2 97%   BMI 29.41 kg/m     Wt Readings from Last 3 Encounters:  06/29/22 187 lb 12.8 oz (85.2 kg)  04/29/22 189 lb 9.6 oz (86 kg)  04/27/22 189 lb 12.8 oz (86.1 kg)     GEN: Well nourished, well developed in no acute distress CARDIAC: RRR, no murmurs, rubs, gallops RESPIRATORY:  Normal work of breathing MUSCULOSKELETAL: no edema    ASSESSMENT & PLAN:    Paroxysmal atrial fibrillation: symptomatic. Events are frequently nocturnal. He is unfortunately not able to tolerate CPAP. S/p AF ablation 04/01/2022. Now with an atrial flutter. Flutter occurred during the post-procedure blinding period. Will schedule DC cardioversion and follow-up a few weeks after. If he has recurrence in the future, will discuss repeat ablation vs medication. Secondary hypercoagulable state: continue apixaban. Renal function has improved, so 5mg  dose of eliquis is appropriate. I advised him to be aware that the eliquis dose depends on his creatinine level, and he should go back to 2.5mg  if his creatinine increases above 1.5. History of failed watchman attempt CAD - history of NSTEMI      Medication Adjustments/Labs and Tests Ordered: Current medicines are reviewed at length with the patient today.  Concerns regarding medicines are outlined above.  No orders of the  defined types were placed in this encounter.  No orders of the defined types were placed in this encounter.  Signed, Maurice Small, MD  06/29/2022 11:57 AM    [facility]

## 2022-06-29 NOTE — Patient Instructions (Signed)
Medication Instructions:  INCREASE Eliquis to 5mg  twice daily  *If you need a refill on your cardiac medications before your next appointment, please call your pharmacy*   Testing/Procedures: Cardioversion - see letter for details Your physician has recommended that you have a Cardioversion (DCCV). Electrical Cardioversion uses a jolt of electricity to your heart either through paddles or wired patches attached to your chest. This is a controlled, usually prescheduled, procedure. Defibrillation is done under light anesthesia in the hospital, and you usually go home the day of the procedure. This is done to get your heart back into a normal rhythm. You are not awake for the procedure. Please see the instruction sheet given to you today.   Follow-Up: At [facility], you and your health needs are our priority.  As part of our continuing mission to provide you with exceptional heart care, we have created designated Provider Care Teams.  These Care Teams include your primary Cardiologist (physician) and Advanced Practice Providers (APPs -  Physician Assistants and Nurse Practitioners) who all work together to provide you with the care you need, when you need it.  We recommend signing up for the patient portal called "MyChart".  Sign up information is provided on this After Visit Summary.  MyChart is used to connect with patients for Virtual Visits (Telemedicine).  Patients are able to view lab/test results, encounter notes, upcoming appointments, etc.  Non-urgent messages can be sent to your provider as well.   To learn more about what you can do with MyChart, go to [email or web address].    Your next appointment:   June 27 at 3:15pm  Provider:   York Pellant, MD

## 2022-07-06 DIAGNOSIS — H5213 Myopia, bilateral: Secondary | ICD-10-CM | POA: Diagnosis not present

## 2022-07-12 ENCOUNTER — Encounter: Payer: Self-pay | Admitting: Internal Medicine

## 2022-07-12 ENCOUNTER — Telehealth: Payer: Self-pay | Admitting: Cardiovascular Disease

## 2022-07-12 DIAGNOSIS — I10 Essential (primary) hypertension: Secondary | ICD-10-CM | POA: Diagnosis not present

## 2022-07-12 DIAGNOSIS — Z7901 Long term (current) use of anticoagulants: Secondary | ICD-10-CM | POA: Diagnosis not present

## 2022-07-12 DIAGNOSIS — H903 Sensorineural hearing loss, bilateral: Secondary | ICD-10-CM | POA: Diagnosis not present

## 2022-07-12 DIAGNOSIS — H9042 Sensorineural hearing loss, unilateral, left ear, with unrestricted hearing on the contralateral side: Secondary | ICD-10-CM | POA: Diagnosis not present

## 2022-07-12 DIAGNOSIS — I4892 Unspecified atrial flutter: Secondary | ICD-10-CM | POA: Diagnosis not present

## 2022-07-12 DIAGNOSIS — R000 Tachycardia, unspecified: Secondary | ICD-10-CM | POA: Diagnosis not present

## 2022-07-12 DIAGNOSIS — I2510 Atherosclerotic heart disease of native coronary artery without angina pectoris: Secondary | ICD-10-CM | POA: Diagnosis not present

## 2022-07-12 DIAGNOSIS — C61 Malignant neoplasm of prostate: Secondary | ICD-10-CM | POA: Diagnosis not present

## 2022-07-12 DIAGNOSIS — Z45321 Encounter for adjustment and management of cochlear device: Secondary | ICD-10-CM | POA: Diagnosis not present

## 2022-07-12 DIAGNOSIS — Z79899 Other long term (current) drug therapy: Secondary | ICD-10-CM | POA: Diagnosis not present

## 2022-07-12 DIAGNOSIS — C679 Malignant neoplasm of bladder, unspecified: Secondary | ICD-10-CM | POA: Diagnosis not present

## 2022-07-12 DIAGNOSIS — I4819 Other persistent atrial fibrillation: Secondary | ICD-10-CM | POA: Diagnosis not present

## 2022-07-12 DIAGNOSIS — I4430 Unspecified atrioventricular block: Secondary | ICD-10-CM | POA: Diagnosis not present

## 2022-07-12 DIAGNOSIS — I480 Paroxysmal atrial fibrillation: Secondary | ICD-10-CM | POA: Diagnosis not present

## 2022-07-12 DIAGNOSIS — I491 Atrial premature depolarization: Secondary | ICD-10-CM | POA: Diagnosis not present

## 2022-07-12 NOTE — Telephone Encounter (Signed)
Kendall form [facility] called in stating this pt needs his cardioversion r/s due to not having a CRNA

## 2022-07-12 NOTE — Telephone Encounter (Signed)
Attempted to contact patient to reschedule DCCV due to lack of CRNA on 6/13 now. Left message for patient to call us back

## 2022-07-12 NOTE — Telephone Encounter (Signed)
Error

## 2022-07-13 ENCOUNTER — Ambulatory Visit (HOSPITAL_BASED_OUTPATIENT_CLINIC_OR_DEPARTMENT_OTHER): Payer: Medicare Other | Admitting: Internal Medicine

## 2022-07-13 DIAGNOSIS — C679 Malignant neoplasm of bladder, unspecified: Secondary | ICD-10-CM | POA: Diagnosis not present

## 2022-07-13 DIAGNOSIS — I10 Essential (primary) hypertension: Secondary | ICD-10-CM | POA: Diagnosis not present

## 2022-07-13 DIAGNOSIS — H903 Sensorineural hearing loss, bilateral: Secondary | ICD-10-CM | POA: Diagnosis not present

## 2022-07-13 DIAGNOSIS — H9042 Sensorineural hearing loss, unilateral, left ear, with unrestricted hearing on the contralateral side: Secondary | ICD-10-CM | POA: Diagnosis not present

## 2022-07-13 DIAGNOSIS — I2510 Atherosclerotic heart disease of native coronary artery without angina pectoris: Secondary | ICD-10-CM | POA: Diagnosis not present

## 2022-07-13 DIAGNOSIS — R000 Tachycardia, unspecified: Secondary | ICD-10-CM | POA: Diagnosis not present

## 2022-07-13 DIAGNOSIS — C61 Malignant neoplasm of prostate: Secondary | ICD-10-CM | POA: Diagnosis not present

## 2022-07-13 DIAGNOSIS — Z79899 Other long term (current) drug therapy: Secondary | ICD-10-CM | POA: Diagnosis not present

## 2022-07-13 DIAGNOSIS — I491 Atrial premature depolarization: Secondary | ICD-10-CM | POA: Diagnosis not present

## 2022-07-13 DIAGNOSIS — I480 Paroxysmal atrial fibrillation: Secondary | ICD-10-CM | POA: Diagnosis not present

## 2022-07-13 DIAGNOSIS — Z7901 Long term (current) use of anticoagulants: Secondary | ICD-10-CM | POA: Diagnosis not present

## 2022-07-14 ENCOUNTER — Telehealth: Payer: Self-pay | Admitting: Internal Medicine

## 2022-07-14 NOTE — Telephone Encounter (Signed)
The surgeon and Cardiologist overseeing pt's care during/after procedure wanted patient to follow up with his primary cardiologist. Wanted Cardiologist to be aware that the patient missed 5 days of Eliquis ( 5 days or 5 doses, not clear?) prior to Cochlear implant; while in recovery room- pt had Afib and Aflutter.  Wanted cardiologist aware, especially since the patient is scheduled for a Cardioversion 07/22/22.   Pt reports that he has some sob and occasional lightheadedness, but this is no different than it was at last office visit.   Informed pt that I would send this information to his provider and we would get back in touch with him with any recommendations.

## 2022-07-14 NOTE — Telephone Encounter (Signed)
Patient c/o Palpitations:  High priority if patient c/o lightheadedness, shortness of breath, or chest pain  How long have you had palpitations/irregular HR/ Afib? Are you having the symptoms now?  No,  Are you currently experiencing lightheadedness, SOB or CP?   Sometimes a little lightheadedness  Do you have a history of afib (atrial fibrillation) or irregular heart rhythm?   Yes  Have you checked your BP or HR? (document readings if available):   No  Are you experiencing any other symptoms?    Patient stated he had a cochlear implant on Monday and he had complications.  Patient stated during surgery he experienced afib and his surgeon recommended he follow-up with his cardiologist.  Patient noted he has a procedure scheduled for next week and wants to know next steps.

## 2022-07-15 NOTE — Telephone Encounter (Signed)
Spoke with pt and reviewed DCCV instructions.  See instructions letter.  Pt verbalizes understanding and agrees with current plan.

## 2022-07-15 NOTE — Telephone Encounter (Signed)
Spoke with pt and advised will need to reschedule DCCV due to scheduling conflict.  He reports he is recovering from cochlear implant surgery.  Pt states he was able to restart Eliquis on 07/13/2022.  Pt advised DCCV will be scheduled 3-4 weeks out as he must be on anticoagulant without interruption.  Will contact pt with instructions once procedure has been rescheduled.  Pt verbalizes understanding and agrees with current plan.

## 2022-07-15 NOTE — Telephone Encounter (Signed)
Mealor, Roberts Gaudy, MD  You; Scheryl Marten, RN; Festus Holts, RN; Chrystie Nose, MDJust now (10:51 AM)   AM Need to know the last date he missed eliquis. He will need to be on uninterrupted eliquis for 3 weeks prior to the ablation. If not, we will need to delay the cardioversion.

## 2022-07-15 NOTE — Telephone Encounter (Signed)
Spoke with patient -- he reports he has been off Eliquis from 5/30 -- 6/3 He had cochlear implant on Monday 6/3 He resumed Eliquis Tuesday 6/4   Per chart review, Ginette Otto., RN has been trying to contact him to r/s DCCV d/t staffing shortages  Routed to MD/RN

## 2022-07-15 NOTE — Telephone Encounter (Signed)
    Dear Charles Wright  You are scheduled for a Cardioversion on Monday, July 1 with Dr. Tenny Craw.  Please arrive at the [facility] (Main Entrance A) at [facility]: [address] [place], [place] [address] at 830am (This is 1 hour before your procedure to ensure your preparation). Free valet parking service is available. You will check in at ADMITTING. The support person will be asked to wait in the waiting room.  It is OK to have someone drop you off and come back when you are ready to be discharged.     DIET:  Nothing to eat or drink after midnight except a sip of water with medications (see medication instructions below)  MEDICATION INSTRUCTIONS: !!IF ANY NEW MEDICATIONS ARE STARTED AFTER TODAY, PLEASE NOTIFY YOUR PROVIDER AS SOON AS POSSIBLE!!  FYI: Medications such as Semaglutide (Ozempic, [place]), Tirzepatide (Mounjaro, Zepbound), Dulaglutide (Trulicity), etc ("GLP1 agonists") AND Canagliflozin (Invokana), Dapagliflozin (Farxiga), Empagliflozin (Jardiance), Ertugliflozin (Steglatro), Bexagliflozin [employer]) or any combination with one of these drugs such as Invokamet (Canagliflozin/Metformin), Synjardy (Empagliflozin/Metformin), etc ("SGLT2 inhibitors") must be held around the time of a procedure. This is not a comprehensive list of all of these drugs. Please review all of your medications and talk to your provider if you take any one of these. If you are not sure, ask your provider.  Continue taking your anticoagulant (blood thinner): Apixaban (Eliquis).  You will need to continue this after your procedure until you are told by your provider that it is safe to stop.    LABS: Your labs will be completed at your office visit with Dr Nelly Laurence on 08/05/2022.  FYI:  For your safety, and to allow [place] to monitor your vital signs accurately during the surgery/procedure we request: If you have artificial nails, gel coating, SNS etc, please have those removed prior to your surgery/procedure. Not  having the nail coverings /polish removed may result in cancellation or delay of your surgery/procedure.  You must have a responsible person to drive you home and stay in the waiting area during your procedure. Failure to do so could result in cancellation.  Bring your insurance cards.  *Special Note: Every effort is made to have your procedure done on time. Occasionally there are emergencies that occur at the hospital that may cause delays. Please be patient if a delay does occur.

## 2022-07-21 DIAGNOSIS — Z09 Encounter for follow-up examination after completed treatment for conditions other than malignant neoplasm: Secondary | ICD-10-CM | POA: Diagnosis not present

## 2022-07-21 DIAGNOSIS — Z9621 Cochlear implant status: Secondary | ICD-10-CM | POA: Diagnosis not present

## 2022-07-22 DIAGNOSIS — I480 Paroxysmal atrial fibrillation: Secondary | ICD-10-CM

## 2022-08-02 DIAGNOSIS — Z45321 Encounter for adjustment and management of cochlear device: Secondary | ICD-10-CM | POA: Diagnosis not present

## 2022-08-05 ENCOUNTER — Ambulatory Visit: Payer: Medicare Other | Attending: Cardiovascular Disease | Admitting: Cardiovascular Disease

## 2022-08-05 ENCOUNTER — Encounter: Payer: Self-pay | Admitting: Cardiovascular Disease

## 2022-08-05 VITALS — BP 110/60 | HR 68 | Ht 67.0 in | Wt 187.6 lb

## 2022-08-05 DIAGNOSIS — I4892 Unspecified atrial flutter: Secondary | ICD-10-CM | POA: Diagnosis not present

## 2022-08-05 DIAGNOSIS — I480 Paroxysmal atrial fibrillation: Secondary | ICD-10-CM | POA: Diagnosis not present

## 2022-08-05 NOTE — H&P (View-Only) (Signed)
Electrophysiology Office Note:    Date:  08/05/2022   ID:  Charles Wright, DOB Jun 27, 1935, MRN [id number]  PCP:  Tamala Ser   [facility] Providers Cardiologist:  Chrystie Nose, MD Electrophysiologist:  Maurice Small, MD     Referring MD: Alyson Ingles, [place]*   Chief complaint: palpitations, fatigu  History of Present Illness:    Charles Wright is a 87 y.o. male with a hx of CAD and NSTEMI in 2016, bladder cancer, prostate cancer, HLD, HTN, OSA referred by dr. Rennis Golden for AF management.     He was diagnosed with AF sometime around 2020. He was managed for a while with amiodarone. He doesn't particularly recall how effective it was for him, but he was diagnosed with IPF and is no longer taking it. The IPF may have been due to COVID.  He has episodes of palpitations. These occur most frequently at night and awake him from sleep. Episodes sometimes occur when he is working in the yard. When this happens, he has to go inside and rest. He does not have chest pain, orthopnea, PND. He has a diagnosis of OSA but could not tolerate CPAP. Since being diagnosed, he lost a significant amount of weight.  He underwent ablation for AF with a WACA on Apr 01, 2022. The procedure was uncomplicated. He feels better since the ablation. He has noted swings in his blood pressure, at times with significant malaise. These are not necessarily associated with fluctuations in his heart rate.      We scheduled DCCV at our last visit. This was rescheduled for a variety of reasons, but this follow-up appointment was not. He will have his cardioversion next week. He has had a cochlear implant since our last visit and is improving in regard to that. There have been no significant changes in his rhythm. He has noted occasional low blood pressures.   EKGs/Labs/Other Studies Reviewed Today:     EKG Interpretation Date/Time:  Thursday August 05 2022 15:37:29 EDT Ventricular Rate:  68 PR  Interval:    QRS Duration:  118 QT Interval:  404 QTC Calculation: 429 R Axis:   39  Text Interpretation: Atrial flutter with 4:1 A-V conduction Inferior infarct (cited on or before 01-Apr-2022) When compared with ECG of 01-Apr-2022 13:27, Atrial flutter has replaced Sinus rhythm Confirmed by York Pellant [phone]) on 08/05/2022 3:40:10 PM    Recent Labs: 09/01/2021: ALT 17 03/11/2022: BUN 19; Creatinine, Ser 1.35; Hemoglobin 16.3; Platelets 264; Potassium 5.3; Sodium 140     Physical Exam:    VS:  BP 110/60   Pulse 68   Ht 5\' 7"  (1.702 m)   Wt 187 lb 9.6 oz (85.1 kg)   SpO2 96%   BMI 29.38 kg/m     Wt Readings from Last 3 Encounters:  08/05/22 187 lb 9.6 oz (85.1 kg)  06/29/22 187 lb 12.8 oz (85.2 kg)  04/29/22 189 lb 9.6 oz (86 kg)     GEN: Well nourished, well developed in no acute distress CARDIAC: RRR, no murmurs, rubs, gallops RESPIRATORY:  Normal work of breathing MUSCULOSKELETAL: no edema    ASSESSMENT & PLAN:    Paroxysmal atrial fibrillation:  symptomatic.  Events are frequently nocturnal. He is unfortunately not able to tolerate CPAP.  S/p AF ablation 04/01/2022. Now with an atrial flutter. Flutter occurred during the post-procedure blinding period.  Proceed with scheduled cardioversion We will follow-up in about a month  Secondary hypercoagulable state:  continue apixaban.  Renal function has improved, so 5mg  dose of eliquis is appropriate. I advised him to be aware that the eliquis dose depends on his creatinine level, and he should go back to 2.5mg  if his creatinine increases above 1.5. History of failed watchman attempt  CAD  history of NSTEMI      Medication Adjustments/Labs and Tests Ordered: Current medicines are reviewed at length with the patient today.  Concerns regarding medicines are outlined above.  Orders Placed This Encounter  Procedures   EKG 12-Lead   No orders of the defined types were placed in this  encounter.    Signed, Maurice Small, MD  08/05/2022 3:47 PM    [facility]

## 2022-08-05 NOTE — Progress Notes (Signed)
Electrophysiology Office Note:    Date:  08/05/2022   ID:  Charles Wright, DOB 07/05/1935, MRN [id number]  PCP:  Costella, Vincent J, PA-C   [facility] Providers Cardiologist:  Kenneth C Hilty, MD Electrophysiologist:  Laquentin Loudermilk E Gyneth Hubka, MD     Referring MD: Costella, Vincent J, PA*   Chief complaint: palpitations, fatigu  History of Present Illness:    Charles Wright is a 87 y.o. male with a hx of CAD and NSTEMI in 2016, bladder cancer, prostate cancer, HLD, HTN, OSA referred by dr. Hilty for AF management.     He was diagnosed with AF sometime around 2020. He was managed for a while with amiodarone. He doesn't particularly recall how effective it was for him, but he was diagnosed with IPF and is no longer taking it. The IPF may have been due to COVID.  He has episodes of palpitations. These occur most frequently at night and awake him from sleep. Episodes sometimes occur when he is working in the yard. When this happens, he has to go inside and rest. He does not have chest pain, orthopnea, PND. He has a diagnosis of OSA but could not tolerate CPAP. Since being diagnosed, he lost a significant amount of weight.  He underwent ablation for AF with a WACA on Apr 01, 2022. The procedure was uncomplicated. He feels better since the ablation. He has noted swings in his blood pressure, at times with significant malaise. These are not necessarily associated with fluctuations in his heart rate.      We scheduled DCCV at our last visit. This was rescheduled for a variety of reasons, but this follow-up appointment was not. He will have his cardioversion next week. He has had a cochlear implant since our last visit and is improving in regard to that. There have been no significant changes in his rhythm. He has noted occasional low blood pressures.   EKGs/Labs/Other Studies Reviewed Today:     EKG Interpretation Date/Time:  Thursday August 05 2022 15:37:29 EDT Ventricular Rate:  68 PR  Interval:    QRS Duration:  118 QT Interval:  404 QTC Calculation: 429 R Axis:   39  Text Interpretation: Atrial flutter with 4:1 A-V conduction Inferior infarct (cited on or before 01-Apr-2022) When compared with ECG of 01-Apr-2022 13:27, Atrial flutter has replaced Sinus rhythm Confirmed by Shawntae Lowy ([id number]) on 08/05/2022 3:40:10 PM    Recent Labs: 09/01/2021: ALT 17 03/11/2022: BUN 19; Creatinine, Ser 1.35; Hemoglobin 16.3; Platelets 264; Potassium 5.3; Sodium 140     Physical Exam:    VS:  BP 110/60   Pulse 68   Ht 5' 7" (1.702 m)   Wt 187 lb 9.6 oz (85.1 kg)   SpO2 96%   BMI 29.38 kg/m     Wt Readings from Last 3 Encounters:  08/05/22 187 lb 9.6 oz (85.1 kg)  06/29/22 187 lb 12.8 oz (85.2 kg)  04/29/22 189 lb 9.6 oz (86 kg)     GEN: Well nourished, well developed in no acute distress CARDIAC: RRR, no murmurs, rubs, gallops RESPIRATORY:  Normal work of breathing MUSCULOSKELETAL: no edema    ASSESSMENT & PLAN:    Paroxysmal atrial fibrillation:  symptomatic.  Events are frequently nocturnal. He is unfortunately not able to tolerate CPAP.  S/p AF ablation 04/01/2022. Now with an atrial flutter. Flutter occurred during the post-procedure blinding period.  Proceed with scheduled cardioversion We will follow-up in about a month  Secondary hypercoagulable state:    continue apixaban.  Renal function has improved, so 5mg dose of eliquis is appropriate. I advised him to be aware that the eliquis dose depends on his creatinine level, and he should go back to 2.5mg if his creatinine increases above 1.5. History of failed watchman attempt  CAD  history of NSTEMI      Medication Adjustments/Labs and Tests Ordered: Current medicines are reviewed at length with the patient today.  Concerns regarding medicines are outlined above.  Orders Placed This Encounter  Procedures   EKG 12-Lead   No orders of the defined types were placed in this  encounter.    Signed, Brandom Kerwin E Jane Birkel, MD  08/05/2022 3:47 PM    [facility] 

## 2022-08-05 NOTE — Patient Instructions (Signed)
Medication Instructions:  Your physician recommends that you continue on your current medications as directed. Please refer to the Current Medication list given to you today. *If you need a refill on your cardiac medications before your next appointment, please call your pharmacy*   Testing/Procedures: Cardioversion scheduled for 08/09/22 - See letter   Follow-Up: At [facility], you and your health needs are our priority.  As part of our continuing mission to provide you with exceptional heart care, we have created designated Provider Care Teams.  These Care Teams include your primary Cardiologist (physician) and Advanced Practice Providers (APPs -  Physician Assistants and Nurse Practitioners) who all work together to provide you with the care you need, when you need it.  We recommend signing up for the patient portal called "MyChart".  Sign up information is provided on this After Visit Summary.  MyChart is used to connect with patients for Virtual Visits (Telemedicine).  Patients are able to view lab/test results, encounter notes, upcoming appointments, etc.  Non-urgent messages can be sent to your provider as well.   To learn more about what you can do with MyChart, go to [email or web address].    Your next appointment:   End of July or early August  Provider:   York Pellant, MD

## 2022-08-06 NOTE — Progress Notes (Signed)
Spoke with patient, Procedure scheduled for 0800, Please arrive at the hospital at 0700, NPO after midnight on Sunday, May take meds with sips of water in the AM, please have transportation for home post procedure, and someone to stay with pt for approximately 24 hours after

## 2022-08-07 ENCOUNTER — Other Ambulatory Visit: Payer: Self-pay | Admitting: Internal Medicine

## 2022-08-07 DIAGNOSIS — I480 Paroxysmal atrial fibrillation: Secondary | ICD-10-CM

## 2022-08-09 ENCOUNTER — Ambulatory Visit (HOSPITAL_COMMUNITY): Payer: Medicare Other | Admitting: Registered Nurse

## 2022-08-09 ENCOUNTER — Ambulatory Visit (HOSPITAL_BASED_OUTPATIENT_CLINIC_OR_DEPARTMENT_OTHER): Payer: Medicare Other | Admitting: Registered Nurse

## 2022-08-09 ENCOUNTER — Other Ambulatory Visit: Payer: Self-pay

## 2022-08-09 ENCOUNTER — Ambulatory Visit (HOSPITAL_COMMUNITY)
Admission: RE | Admit: 2022-08-09 | Discharge: 2022-08-09 | Disposition: A | Discharge status: Home / Self Care | Payer: Medicare Other | Attending: Internal Medicine | Admitting: Internal Medicine

## 2022-08-09 ENCOUNTER — Encounter (HOSPITAL_COMMUNITY)
Admission: RE | Disposition: A | Discharge status: Home / Self Care | Payer: Self-pay | Source: Home / Self Care | Attending: Internal Medicine

## 2022-08-09 DIAGNOSIS — E785 Hyperlipidemia, unspecified: Secondary | ICD-10-CM | POA: Diagnosis not present

## 2022-08-09 DIAGNOSIS — G4733 Obstructive sleep apnea (adult) (pediatric): Secondary | ICD-10-CM | POA: Insufficient documentation

## 2022-08-09 DIAGNOSIS — I10 Essential (primary) hypertension: Secondary | ICD-10-CM

## 2022-08-09 DIAGNOSIS — E039 Hypothyroidism, unspecified: Secondary | ICD-10-CM

## 2022-08-09 DIAGNOSIS — I2510 Atherosclerotic heart disease of native coronary artery without angina pectoris: Secondary | ICD-10-CM

## 2022-08-09 DIAGNOSIS — Z8551 Personal history of malignant neoplasm of bladder: Secondary | ICD-10-CM | POA: Diagnosis not present

## 2022-08-09 DIAGNOSIS — E669 Obesity, unspecified: Secondary | ICD-10-CM | POA: Diagnosis not present

## 2022-08-09 DIAGNOSIS — I4892 Unspecified atrial flutter: Secondary | ICD-10-CM | POA: Insufficient documentation

## 2022-08-09 DIAGNOSIS — I252 Old myocardial infarction: Secondary | ICD-10-CM | POA: Insufficient documentation

## 2022-08-09 DIAGNOSIS — D6869 Other thrombophilia: Secondary | ICD-10-CM | POA: Diagnosis not present

## 2022-08-09 DIAGNOSIS — Z8546 Personal history of malignant neoplasm of prostate: Secondary | ICD-10-CM | POA: Insufficient documentation

## 2022-08-09 DIAGNOSIS — I480 Paroxysmal atrial fibrillation: Secondary | ICD-10-CM | POA: Diagnosis not present

## 2022-08-09 DIAGNOSIS — Z7901 Long term (current) use of anticoagulants: Secondary | ICD-10-CM | POA: Insufficient documentation

## 2022-08-09 DIAGNOSIS — Z6839 Body mass index (BMI) 39.0-39.9, adult: Secondary | ICD-10-CM | POA: Diagnosis not present

## 2022-08-09 HISTORY — PX: CARDIOVERSION: SHX1299

## 2022-08-09 SURGERY — CARDIOVERSION
Anesthesia: General

## 2022-08-09 MED ORDER — SODIUM CHLORIDE 0.9 % IV SOLN: INTRAVENOUS | Status: DC

## 2022-08-09 MED ORDER — PROPOFOL 10 MG/ML IV BOLUS
INTRAVENOUS | Status: DC | PRN
  Administered 2022-08-09: 60 mg via INTRAVENOUS

## 2022-08-09 MED ORDER — APIXABAN 5 MG PO TABS
5.0000 mg | ORAL_TABLET | Freq: Once | ORAL | Status: AC
  Administered 2022-08-09: 5 mg via ORAL
  Filled 2022-08-09: qty 1

## 2022-08-09 MED ORDER — LIDOCAINE 2% (20 MG/ML) 5 ML SYRINGE
INTRAMUSCULAR | Status: DC | PRN
  Administered 2022-08-09: 40 mg via INTRAVENOUS

## 2022-08-09 SURGICAL SUPPLY — 1 items: ELECT DEFIB PAD ADLT CADENCE (PAD) ×1 IMPLANT

## 2022-08-09 NOTE — Anesthesia Preprocedure Evaluation (Addendum)
Anesthesia Evaluation  Patient identified by MRN, date of birth, ID band Patient awake    Reviewed: Allergy & Precautions, NPO status , Patient's Chart, lab work & pertinent test results  Airway Mallampati: I  TM Distance: >3 FB Neck ROM: Full    Dental  (+) Teeth Intact, Dental Advisory Given   Pulmonary sleep apnea and Continuous Positive Airway Pressure Ventilation    breath sounds clear to auscultation       Cardiovascular hypertension, Pt. on home beta blockers + CAD  + dysrhythmias Atrial Fibrillation  Rhythm:Irregular Rate:Tachycardia  Echo:  1. Left ventricular ejection fraction, by estimation, is 55 to 60%. The  left ventricle has normal function. The left ventricle has no regional  wall motion abnormalities. There is mild concentric left ventricular  hypertrophy.   2. Right ventricular systolic function is normal. The right ventricular  size is normal.   3. Large, bilobed LA appendage with no thrombus. Left atrial size was  mildly dilated.   4. There was a PFO present with left to right flow.   5. The mitral valve is normal in structure. Mild to moderate mitral valve  regurgitation. No evidence of mitral stenosis.   6. The aortic valve is tricuspid. There is mild calcification of the  aortic valve. Aortic valve regurgitation is mild. No aortic stenosis is  present.      Neuro/Psych negative neurological ROS  negative psych ROS   GI/Hepatic Neg liver ROS,GERD  ,,  Endo/Other  Hypothyroidism    Renal/GU Renal disease     Musculoskeletal  (+) Arthritis ,    Abdominal   Peds  Hematology negative hematology ROS (+)   Anesthesia Other Findings   Reproductive/Obstetrics                             Anesthesia Physical Anesthesia Plan  ASA: 3  Anesthesia Plan: General   Post-op Pain Management: Minimal or no pain anticipated   Induction: Intravenous  PONV Risk Score and  Plan: 0  Airway Management Planned: Natural Airway and Nasal Cannula  Additional Equipment: None  Intra-op Plan:   Post-operative Plan:   Informed Consent: I have reviewed the patients History and Physical, chart, labs and discussed the procedure including the risks, benefits and alternatives for the proposed anesthesia with the patient or authorized representative who has indicated his/her understanding and acceptance.       Plan Discussed with: CRNA  Anesthesia Plan Comments:        Anesthesia Quick Evaluation

## 2022-08-09 NOTE — Discharge Instructions (Signed)

## 2022-08-09 NOTE — CV Procedure (Signed)
Electrical Cardioversion Procedure Note Charles Wright [id number] Dec 30, 1935  Procedure: Electrical Cardioversion Indications:  Atrial Flutter  Procedure Details Consent: Risks of procedure as well as the alternatives and risks of each were explained to the (patient/caregiver).  Consent for procedure obtained. Time Out: Verified patient identification, verified procedure, site/side was marked, verified correct patient position, special equipment/implants available, medications/allergies/relevent history reviewed, required imaging and test results available.  Performed  Patient placed on cardiac monitor, pulse oximetry, supplemental oxygen as necessary.  Sedation given:  Propofol and lidocaine  Pacer pads placed anterior and posterior chest.  Cardioverted 1 time(s).  Cardioverted at 200J.  Evaluation Findings: Post procedure EKG shows: NSR Complications: None Patient did tolerate procedure well.   Charles Wright 08/09/2022, 1:22 PM

## 2022-08-09 NOTE — Anesthesia Postprocedure Evaluation (Signed)
Anesthesia Post Note  Patient: Charles Wright  Procedure(s) Performed: CARDIOVERSION     Patient location during evaluation: PACU Anesthesia Type: General Level of consciousness: awake and alert Pain management: pain level controlled Vital Signs Assessment: post-procedure vital signs reviewed and stable Respiratory status: spontaneous breathing, nonlabored ventilation, respiratory function stable and patient connected to nasal cannula oxygen Cardiovascular status: blood pressure returned to baseline and stable Postop Assessment: no apparent nausea or vomiting Anesthetic complications: no  No notable events documented.  Last Vitals:  Vitals:   08/09/22 0900 08/09/22 0905  BP: 91/69 102/79  Pulse: 71   Temp:    SpO2: 93%     Last Pain:  Vitals:   08/09/22 0840  TempSrc: Temporal  PainSc: 0-No pain                 Shelton Silvas

## 2022-08-09 NOTE — Interval H&P Note (Signed)
History and Physical Interval Note:  08/09/2022 8:04 AM  Charles Wright  has presented today for surgery, with the diagnosis of AFIB.  The various methods of treatment have been discussed with the patient and family. After consideration of risks, benefits and other options for treatment, the patient has consented to  Procedure(s): CARDIOVERSION (N/A) as a surgical intervention.  The patient's history has been reviewed, patient examined, no change in status, stable for surgery.  I have reviewed the patient's chart and labs.  Questions were answered to the patient's satisfaction.     [name]

## 2022-08-09 NOTE — Transfer of Care (Signed)
Immediate Anesthesia Transfer of Care Note  Patient: Charles Wright  Procedure(s) Performed: CARDIOVERSION  Patient Location: Cath Lab  Anesthesia Type:General  Level of Consciousness: drowsy  Airway & Oxygen Therapy: Patient Spontanous Breathing and Patient connected to nasal cannula oxygen  Post-op Assessment: Report given to RN and Post -op Vital signs reviewed and stable  Post vital signs: Reviewed and stable  Last Vitals:  Vitals Value Taken Time  BP 97/73   Temp    Pulse 76   Resp 18   SpO2 93     Last Pain:  Vitals:   08/09/22 0729  TempSrc: Temporal  PainSc: 0-No pain         Complications: No notable events documented.

## 2022-08-10 ENCOUNTER — Encounter (HOSPITAL_COMMUNITY): Payer: Self-pay | Admitting: Internal Medicine

## 2022-08-18 DIAGNOSIS — H9190 Unspecified hearing loss, unspecified ear: Secondary | ICD-10-CM | POA: Diagnosis not present

## 2022-08-18 DIAGNOSIS — Z45321 Encounter for adjustment and management of cochlear device: Secondary | ICD-10-CM | POA: Diagnosis not present

## 2022-08-27 DIAGNOSIS — L814 Other melanin hyperpigmentation: Secondary | ICD-10-CM | POA: Diagnosis not present

## 2022-08-27 DIAGNOSIS — L821 Other seborrheic keratosis: Secondary | ICD-10-CM | POA: Diagnosis not present

## 2022-08-27 DIAGNOSIS — Z09 Encounter for follow-up examination after completed treatment for conditions other than malignant neoplasm: Secondary | ICD-10-CM | POA: Diagnosis not present

## 2022-08-27 DIAGNOSIS — D1801 Hemangioma of skin and subcutaneous tissue: Secondary | ICD-10-CM | POA: Diagnosis not present

## 2022-08-30 DIAGNOSIS — Z8546 Personal history of malignant neoplasm of prostate: Secondary | ICD-10-CM | POA: Diagnosis not present

## 2022-08-30 DIAGNOSIS — Z85528 Personal history of other malignant neoplasm of kidney: Secondary | ICD-10-CM | POA: Diagnosis not present

## 2022-08-30 DIAGNOSIS — I2510 Atherosclerotic heart disease of native coronary artery without angina pectoris: Secondary | ICD-10-CM | POA: Diagnosis not present

## 2022-08-30 DIAGNOSIS — Z8551 Personal history of malignant neoplasm of bladder: Secondary | ICD-10-CM | POA: Diagnosis not present

## 2022-09-14 ENCOUNTER — Encounter: Payer: Self-pay | Admitting: Cardiovascular Disease

## 2022-09-14 ENCOUNTER — Ambulatory Visit: Payer: Medicare Other | Admitting: Cardiovascular Disease

## 2022-09-14 VITALS — BP 106/72 | Ht 67.0 in | Wt 184.0 lb

## 2022-09-14 DIAGNOSIS — I480 Paroxysmal atrial fibrillation: Secondary | ICD-10-CM | POA: Diagnosis not present

## 2022-09-14 NOTE — Progress Notes (Signed)
  Electrophysiology Office Note:    Date:  09/14/2022   ID:  Charles Wright, DOB April 01, 1935, MRN [id number]  PCP:  Tamala Ser   [facility] Providers Cardiologist:  Chrystie Nose, MD Electrophysiologist:  Maurice Small, MD     Referring MD: Alyson Ingles, [place]*   Chief complaint: palpitations, fatigu  History of Present Illness:    Charles Wright is a 87 y.o. male with a hx of CAD and NSTEMI in 2016, bladder cancer, prostate cancer, HLD, HTN, OSA referred by dr. Rennis Golden for AF management.     He was diagnosed with AF sometime around 2020. He was managed for a while with amiodarone. He doesn't particularly recall how effective it was for him, but he was diagnosed with IPF and is no longer taking it. The IPF may have been due to COVID.  He has episodes of palpitations. These occur most frequently at night and awake him from sleep. Episodes sometimes occur when he is working in the yard. When this happens, he has to go inside and rest. He does not have chest pain, orthopnea, PND. He has a diagnosis of OSA but could not tolerate CPAP. Since being diagnosed, he lost a significant amount of weight.  He underwent ablation for AF with a WACA on Apr 01, 2022. The procedure was uncomplicated. He feels better since the ablation. He has noted swings in his blood pressure, at times with significant malaise. These are not necessarily associated with fluctuations in his heart rate.  He had atrial flutter after the AF ablation and underwent DCCV on August 09, 2022      Since his cardioversion, he has been doing very well from a rhythm standpoint.  He has not had any symptoms to suggest recurrence of atrial fibrillation or flutter.   EKGs/Labs/Other Studies Reviewed Today:          Recent Labs: 03/11/2022: BUN 19; Creatinine, Ser 1.35; Hemoglobin 16.3; Platelets 264; Potassium 5.3; Sodium 140     Physical Exam:    VS:  There were no vitals taken for this visit.     Wt Readings from Last 3 Encounters:  08/09/22 185 lb (83.9 kg)  08/05/22 187 lb 9.6 oz (85.1 kg)  06/29/22 187 lb 12.8 oz (85.2 kg)     GEN: Well nourished, well developed in no acute distress CARDIAC: RRR, no murmurs, rubs, gallops RESPIRATORY:  Normal work of breathing MUSCULOSKELETAL: no edema    ASSESSMENT & PLAN:    Paroxysmal atrial fibrillation:  symptomatic.  Events are frequently nocturnal. He is unfortunately not able to tolerate CPAP.  S/p AF ablation 04/01/2022.  Had flutter occur during the post-procedure blinding period.  Maintaining sinus rhythm after cardioversion   Secondary hypercoagulable state:  continue apixaban.  Renal function has improved, so 5mg  dose of eliquis is appropriate. I advised him to be aware that the eliquis dose depends on his creatinine level, and he should go back to 2.5mg  if his creatinine increases above 1.5. History of failed watchman attempt  CAD  history of NSTEMI      Medication Adjustments/Labs and Tests Ordered: Current medicines are reviewed at length with the patient today.  Concerns regarding medicines are outlined above.  No orders of the defined types were placed in this encounter.  No orders of the defined types were placed in this encounter.    Signed, Maurice Small, MD  09/14/2022 1:25 PM    [facility]

## 2022-09-14 NOTE — Patient Instructions (Signed)
Medication Instructions:  Your physician recommends that you continue on your current medications as directed. Please refer to the Current Medication list given to you today. *If you need a refill on your cardiac medications before your next appointment, please call your pharmacy*   Follow-Up: At [facility], you and your health needs are our priority.  As part of our continuing mission to provide you with exceptional heart care, we have created designated Provider Care Teams.  These Care Teams include your primary Cardiologist (physician) and Advanced Practice Providers (APPs -  Physician Assistants and Nurse Practitioners) who all work together to provide you with the care you need, when you need it.  We recommend signing up for the patient portal called "MyChart".  Sign up information is provided on this After Visit Summary.  MyChart is used to connect with patients for Virtual Visits (Telemedicine).  Patients are able to view lab/test results, encounter notes, upcoming appointments, etc.  Non-urgent messages can be sent to your provider as well.   To learn more about what you can do with MyChart, go to [email or web address].    Your next appointment:   1 year(s)  Provider:   Augustus Mealor, MD  

## 2022-10-12 DIAGNOSIS — H9190 Unspecified hearing loss, unspecified ear: Secondary | ICD-10-CM | POA: Diagnosis not present

## 2022-10-12 DIAGNOSIS — H9193 Unspecified hearing loss, bilateral: Secondary | ICD-10-CM | POA: Diagnosis not present

## 2022-10-12 DIAGNOSIS — Z9621 Cochlear implant status: Secondary | ICD-10-CM | POA: Diagnosis not present

## 2022-10-12 DIAGNOSIS — Z45321 Encounter for adjustment and management of cochlear device: Secondary | ICD-10-CM | POA: Diagnosis not present

## 2022-10-19 DIAGNOSIS — C61 Malignant neoplasm of prostate: Secondary | ICD-10-CM | POA: Diagnosis not present

## 2022-10-19 DIAGNOSIS — Z8546 Personal history of malignant neoplasm of prostate: Secondary | ICD-10-CM | POA: Diagnosis not present

## 2022-10-19 DIAGNOSIS — Z8553 Personal history of malignant neoplasm of renal pelvis: Secondary | ICD-10-CM | POA: Diagnosis not present

## 2022-10-22 DIAGNOSIS — Z8553 Personal history of malignant neoplasm of renal pelvis: Secondary | ICD-10-CM | POA: Diagnosis not present

## 2022-10-22 DIAGNOSIS — Z8551 Personal history of malignant neoplasm of bladder: Secondary | ICD-10-CM | POA: Diagnosis not present

## 2022-10-22 DIAGNOSIS — C61 Malignant neoplasm of prostate: Secondary | ICD-10-CM | POA: Diagnosis not present

## 2022-10-22 DIAGNOSIS — Z8546 Personal history of malignant neoplasm of prostate: Secondary | ICD-10-CM | POA: Diagnosis not present

## 2022-10-22 DIAGNOSIS — C763 Malignant neoplasm of pelvis: Secondary | ICD-10-CM | POA: Diagnosis not present

## 2022-10-26 DIAGNOSIS — M5416 Radiculopathy, lumbar region: Secondary | ICD-10-CM | POA: Diagnosis not present

## 2022-10-26 DIAGNOSIS — M48061 Spinal stenosis, lumbar region without neurogenic claudication: Secondary | ICD-10-CM | POA: Diagnosis not present

## 2022-10-27 ENCOUNTER — Encounter (HOSPITAL_COMMUNITY): Payer: Self-pay

## 2022-10-27 ENCOUNTER — Encounter: Payer: Self-pay | Admitting: Pulmonary Disease

## 2022-10-27 ENCOUNTER — Ambulatory Visit: Payer: Medicare Other | Admitting: Pulmonary Disease

## 2022-10-27 VITALS — BP 118/70 | HR 67 | Temp 98.2°F | Ht 67.75 in | Wt 189.0 lb

## 2022-10-27 DIAGNOSIS — Z8551 Personal history of malignant neoplasm of bladder: Secondary | ICD-10-CM | POA: Diagnosis not present

## 2022-10-27 DIAGNOSIS — Z8553 Personal history of malignant neoplasm of renal pelvis: Secondary | ICD-10-CM | POA: Diagnosis not present

## 2022-10-27 DIAGNOSIS — Z23 Encounter for immunization: Secondary | ICD-10-CM

## 2022-10-27 DIAGNOSIS — Z8546 Personal history of malignant neoplasm of prostate: Secondary | ICD-10-CM | POA: Diagnosis not present

## 2022-10-27 DIAGNOSIS — J849 Interstitial pulmonary disease, unspecified: Secondary | ICD-10-CM

## 2022-10-27 DIAGNOSIS — E349 Endocrine disorder, unspecified: Secondary | ICD-10-CM | POA: Diagnosis not present

## 2022-10-27 NOTE — Progress Notes (Signed)
Charles Wright    [id number]    Jun 13, 1935  Primary Care Physician:Costella, Darci Current, PA-C  Referring Physician: Alyson Ingles, PA-C [address] [place],  [place] [address]  Chief complaint: Follow up for ILD  HPI: 87 year old with history of prostate, bladder, renal cancer, atrial fibrillation, coronary artery disease, sleep apnea Has complains of chronic cough, nonproductive in nature for the past 1 year.  Not associated with dyspnea or congestion He has been tried on Protonix for empiric treatment of GERD with no improvement  He had a high-res CT recently which showed progressive probable UIP pattern pulmonary fibrosis and has been referred to clinic for further evaluation  Reports a viral illness in January 2020 after attending a conference in [place].  He was not tested for Covid at that time  Pets: Dogs, cats.  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  No asbestos, mold, hot tub, Jacuzzi or down pillows, comforters. ILD questionnaire 01/14/20-negative except as above. Smoking history: Occasionally smokes cigarettes and pipe in college Travel history: No significant travel Relevant family history: No significant family history of lung disease.  Interval history: He is here in pulm clinic after a gap of 2 years.  States that his dyspnea and cough is worse than before In 2022 we had referred him to rheumatology for elevated ANA.  Seen by Dr. Corliss Skains who felt he did not have any interstitial lung disease Also saw GI for acid reflux with no ongoing symptoms  Outpatient Encounter Medications as of 10/27/2022  Medication Sig   acetaminophen (TYLENOL) 500 MG tablet Take 1,000 mg by mouth every 6 (six) hours as needed for moderate pain or headache.   allopurinol (ZYLOPRIM) 300 MG tablet Take 300 mg by mouth every morning.     apixaban (ELIQUIS) 5 MG TABS tablet Take 1 tablet (5 mg total) by mouth 2 (two) times daily.   aspirin EC 81 MG tablet Take 81 mg by mouth every evening.   Cholecalciferol (VITAMIN D-3) 125 MCG (5000 UT) TABS Take 5,000 Units by mouth daily.   doxycycline (MONODOX) 50 MG capsule Take 50 mg by mouth daily.   EPINEPHrine 0.3 mg/0.3 mL IJ SOAJ injection Inject 0.3 mLs (0.3 mg total) into the muscle once. (Patient taking differently: Inject 0.3 mg into the muscle as needed for anaphylaxis.)   GLUCOSAMINE SULFATE PO Take 750 mg by mouth daily.   ipratropium (ATROVENT) 0.03 % nasal spray Place 1 spray into both nostrils 2 (two) times daily.   levothyroxine (SYNTHROID, LEVOTHROID) 50 MCG tablet Take 50 mcg by mouth daily before breakfast.   metoprolol tartrate (LOPRESSOR) 25 MG tablet Take 1 tablet (25 mg total) by mouth 2 (two) times daily.   nitroGLYCERIN (NITROSTAT) 0.4 MG SL tablet Place 1 tablet (0.4 mg total) under the tongue every 5 (five) minutes x 3 doses as needed for chest pain.   rosuvastatin (CRESTOR) 20 MG tablet TAKE ONE TABLET BY MOUTH DAILY   Testosterone 1.62 % GEL Apply 2 Pump topically every other day. Applied to shoulder area   [DISCONTINUED] doxycycline (VIBRAMYCIN) 50 MG capsule Take 50 mg by mouth daily.   No facility-administered encounter medications on file as of 10/27/2022.    Physical Exam: Blood pressure 118/70, pulse 67, temperature 98.2 F (36.8 C), temperature source Oral, height 5' 7.75" (1.721 m), weight 189  lb (85.7 kg), SpO2 97%. Gen:      No acute distress HEENT:  EOMI, sclera anicteric Neck:     No masses; no thyromegaly Lungs:    Clear to auscultation bilaterally; normal respiratory effort CV:         Regular rate and rhythm; no murmurs Abd:      + bowel sounds; soft, non-tender; no palpable masses, no distension Ext:    No edema; adequate peripheral perfusion Skin:      Warm and dry; no rash Neuro: alert and oriented x 3 Psych: normal mood and affect    Data Reviewed: Imaging: CT abdomen pelvis 03/18/2019 -mild interstitial changes at the base High-res CT 12/07/2019-left upper lobe pulmonary nodule measuring 5 mm, basilar predominant fibrosis and probable UIP pattern.  Progressive compared to prior scans I have reviewed the images personally.  PFTs: 06/18/2015 FVC 3.94 (6%), FEV1 3.11 (18%), F/F 79, TLC 6.46 [90%], DLCO 24.74 [85%] Normal test  03/07/2020 FVC 3.73 [105%], FEV1 2.94 (119%], F/F 79, TLC 5.38 [81%], DLCO 15.99 [71%] Mild diffusion defect  Labs:  ANA serologies 01/14/2020-positive for ANA 1 is to 80, p-ANCA 1 is to 80  Assessment:  ILD Review of CT scan shows probable UIP pattern fibrosis.  He has minimal changes at the base dating back to at least 2012 on CTs of the abdomen.  It is more clearly progressive since his last CT abdomen in 2020.  This is suggestive of IPF in the absence of any exposures or CTD symptoms  CTD serologies reviewed with mild elevation in ANA, ANCA.  This may be nonspecific Rheumatology referral completed with no evidence of autoimmune process  At last visit he was not interested in biopsy or treatment with antifibrotics at this stage.  With worsening symptoms we will reassess with high-res CT and PFTs Return to clinic in 3 months for review and plan for next labs  Referral to pulmonary rehab   Plan/Recommendations: Follow-up PFTs, chest high-res Referred to pulmonary rehab  Chilton Greathouse MD [facility] 10/27/2022, 10:29 AM  CC: Alyson Ingles, PA*

## 2022-10-27 NOTE — Patient Instructions (Addendum)
Order high-res CT, PFTs Return to clinic in 3 months Referral to pulmonary rehab

## 2022-10-27 NOTE — Addendum Note (Signed)
Addended by: Christen Butter on: 10/27/2022 11:11 AM   Modules accepted: Orders

## 2022-10-27 NOTE — Addendum Note (Signed)
Addended by: Christen Butter on: 10/27/2022 10:51 AM   Modules accepted: Orders

## 2022-11-01 DIAGNOSIS — D6869 Other thrombophilia: Secondary | ICD-10-CM | POA: Diagnosis not present

## 2022-11-01 DIAGNOSIS — I252 Old myocardial infarction: Secondary | ICD-10-CM | POA: Diagnosis not present

## 2022-11-10 ENCOUNTER — Ambulatory Visit
Admission: RE | Admit: 2022-11-10 | Discharge: 2022-11-10 | Disposition: A | Discharge status: Home / Self Care | Payer: Medicare Other | Source: Ambulatory Visit | Attending: Pulmonary Disease | Admitting: Pulmonary Disease

## 2022-11-10 DIAGNOSIS — R911 Solitary pulmonary nodule: Secondary | ICD-10-CM | POA: Diagnosis not present

## 2022-11-10 DIAGNOSIS — J849 Interstitial pulmonary disease, unspecified: Secondary | ICD-10-CM

## 2022-11-10 DIAGNOSIS — J8410 Pulmonary fibrosis, unspecified: Secondary | ICD-10-CM | POA: Diagnosis not present

## 2022-11-18 ENCOUNTER — Encounter (HOSPITAL_COMMUNITY): Payer: Self-pay

## 2022-11-19 ENCOUNTER — Telehealth (HOSPITAL_COMMUNITY): Payer: Self-pay

## 2022-11-19 NOTE — Telephone Encounter (Signed)
Pt returned PR phone call and stated he is interested in PR. Patient will come in for orientation on 11/29/22 @ 8AM and will attend the 1:15PM exercise class.   Pensions consultant.

## 2022-11-22 ENCOUNTER — Encounter (HOSPITAL_BASED_OUTPATIENT_CLINIC_OR_DEPARTMENT_OTHER): Payer: Self-pay | Admitting: Internal Medicine

## 2022-11-22 ENCOUNTER — Ambulatory Visit (HOSPITAL_BASED_OUTPATIENT_CLINIC_OR_DEPARTMENT_OTHER): Payer: Medicare Other | Admitting: Internal Medicine

## 2022-11-22 VITALS — BP 114/78 | HR 74 | Ht 67.0 in | Wt 191.0 lb

## 2022-11-22 DIAGNOSIS — E785 Hyperlipidemia, unspecified: Secondary | ICD-10-CM

## 2022-11-22 DIAGNOSIS — I25118 Atherosclerotic heart disease of native coronary artery with other forms of angina pectoris: Secondary | ICD-10-CM

## 2022-11-22 DIAGNOSIS — I119 Hypertensive heart disease without heart failure: Secondary | ICD-10-CM

## 2022-11-22 DIAGNOSIS — I480 Paroxysmal atrial fibrillation: Secondary | ICD-10-CM

## 2022-11-22 NOTE — Patient Instructions (Signed)
Medication Instructions:  Your physician recommends that you continue on your current medications as directed. Please refer to the Current Medication list given to you today.  *If you need a refill on your cardiac medications before your next appointment, please call your pharmacy*   Follow-Up: At [facility], you and your health needs are our priority.  As part of our continuing mission to provide you with exceptional heart care, we have created designated Provider Care Teams.  These Care Teams include your primary Cardiologist (physician) and Advanced Practice Providers (APPs -  Physician Assistants and Nurse Practitioners) who all work together to provide you with the care you need, when you need it.  We recommend signing up for the patient portal called "MyChart".  Sign up information is provided on this After Visit Summary.  MyChart is used to connect with patients for Virtual Visits (Telemedicine).  Patients are able to view lab/test results, encounter notes, upcoming appointments, etc.  Non-urgent messages can be sent to your provider as well.   To learn more about what you can do with MyChart, go to [email or web address].    Your next appointment:    12 months with Dr. Rennis Golden

## 2022-11-22 NOTE — Progress Notes (Signed)
OFFICE NOTE  Chief Complaint:  Follow-up  Primary Care Physician: Alyson Ingles, PA-C  HPI:  Charles Wright is a 87 y.o. male with a past medial history significant for coronary artery disease with non-STEMI in 2016.  He was found to have distal LAD disease not amenable to PCI as well as moderate mid to proximal LAD disease.  Medical therapy was recommended.  He also has a history of bladder cancer, prostate cancer, hyperlipidemia, hypertension and OSA on CPAP.  He has been followed by Dr. Donnie Aho.  He maintains on clopidogrel but not aspirin.  Recently he has been having issues with ureteral stricture and is recommended to have a cystoscopy with Dr. Mena Goes.  He would require clearance including holding clopidogrel prior to that procedure.  Today in the office he is coughing significantly.  He apparently had upper respiratory infection and then also had concomitant rash which was painful and vesicular and diagnosed as shingles by a dermatologist that he is friends with.  Besides his shortness of breath related upper respiratory infection as well as chest wall pain from shingles, he denies any chest pain concerning for angina or recent worsening shortness of breath.  10/10/2018  Charles Wright returns today for follow-up of coronary disease and atrial fibrillation.  He is a former patient of Dr. Donnie Aho however Dr. Donnie Aho is now retired.  When we last spoke he received preoperative clearance for urologic procedure.  He does have a history of a bladder cancer and hematuria.  Interestingly, based on further chart review, he had had a prior watchman procedure for his A. fib attempt at stroke reduction.  At that time because of bleeding he was not felt to be a candidate for anticoagulation for his A. fib.  He had been on amiodarone.  He was then referred for watchman procedure which shows ultimately unsuccessful.  I was involved in the imaging component of that procedure.  Subsequently it was recommended he  go on aspirin and Plavix since he was not a candidate for anticoagulation due to GU bleeding from bladder cancer.  That has however been successfully treated and has had no further bleeding issues.  He also just had recent ureteral stenting which seems to have been successful.  When reviewing his chart today however it it notes that he is only on clopidogrel monotherapy.  Have an STEMI in 2016 and was found to have significant coronary disease however not amenable to intervention.  He is done well with that without any further chest pain however did have significant fatigue recently.  It turned out this was related to low testosterone and he is now on testosterone replacement.  Likely a side effect of antiandrogen therapy.  Recent labs show total cholesterol 158, HDL 50 LDL, 86 and triglycerides 161.  05/02/2019  Charles Wright returns today for follow-up. He is complaining of some issues with lightheadedness. He recently underwent surgery for what was suspicious for a left upper tract urothelial carcinoma. This was complicated by pain and bladder spasms requiring multiple ER visits. He has had a decreased appetite and weight loss (5 pounds of which was his left kidney/ureter. Additionally, he is down a total of 20 pounds since I last saw him. This is pertinent because his blood pressure today was 90/60. In general it has run higher than this. I suspect it may be a reason why feels lightheaded. His EKG shows a sinus bradycardia at 55. Possible medications that could causes include his isosorbide and metoprolol which  she takes 50 mg twice daily.  08/21/2019  [name] seen today in follow-up.  Overall he continues to do well.  He says since he had his nephrectomies had no further bleeding issues.  He denies any chest pain or worsening shortness of breath.  Recently had some orthostatic hypotension with low blood pressure noted to be in the 90s systolic.  He was taken off of his Imdur and his beta-blocker was  decreased.  Blood pressure today was 116/72 and his mention he feels well.  01/17/2020  Charles Wright is seen today for follow-up.  Over the summer he had chest pain and was seen by Azalee Course, PA-C.  Stress test was performed which showed small fixed apical defect without any ischemia, consistent with known distal LAD severe stenosis.  This vessel was thought to be too small to intervene on.  No other reversible ischemia was appreciated.  He noted that his chest pain was not improved with nitroglycerin.  During his exam he was also found to have bibasilar dry crackles consistent with possible interstitial lung disease.  High-resolution CT scan was performed which did corroborate this.  He has subsequently been referred to pulmonary and saw Dr. Isaiah Serge, who feels he may have IPF.  He has pending pulmonary function tests and follow-up with him in January.  He reports he still gets some intermittent chest discomfort in the left upper chest.  04/18/2020  Charles Wright returns for follow-up.  He continues to have work-up of pulmonary fibrosis.  He remains on low-dose Eliquis 2.5 mg twice daily.  Heart rate is in the 60s today.  EKG shows sinus rhythm with frequent PVCs in a quadrigeminal pattern.  He says he is really unaware of any extrasystoles or palpitations.  Weight is down about 6 pounds since we last saw him.  04/21/2021  Charles Wright returns today for follow-up.  He had called the office last year about concerns of palpitations, dizziness and fatigue.  This has been going on and off for the last year.  I was concerned that he might be having recurrent atrial fibrillation or perhaps PVCs.  We asked him to come to the office for an appointment but have not seen him until today.  EKG today does demonstrate frequent PVCs these in fact ventricular bigeminy.  If he has been in this for a good period of time he may have developed some cardiomyopathy.  11/17/2021  Charles Wright returns today for follow-up.  He continues to  have episodes of dizziness, fatigue and progressive dyspnea on exertion.  He was seen in April by Gillian Shields, NP and noted to be having more palpitations.  She had proposed increasing his metoprolol.  He wore a monitor which showed some recurrent atrial fibrillation/flutter and SVT with a low burden of about 3%.  His metoprolol was then increased up to 50 mg twice daily.  This provided perhaps a little bit of benefit however subsequently he has become worse.  His energy level is poor.  He is quite fatigued and gets short of breath easily.  He is now waking up in the middle the night with palpitations.  He denies any chest pain.  Of note he had a cardiac catheterization back in 2016 which showed 99% stenosis of the distal LAD at the time he had NSTEMI as well as 50 to 60% stenosis of the mid LAD.  My concern is that he has had progression of this coronary disease.  01/26/2022  Mr. Fontaine underwent recent  left heart catheterization in October.  This showed stable coronary artery disease with stable apical LAD stenosis which was severe as well as nonobstructive proximal and mid LAD plaquing, angiographically improved from his last cath.  No intervention was undertaken.  Subsequently he saw Dr. Nelly Laurence with cardiac EP for options for his atrial fibrillation.  They discussed the possibility of using dofetilide but he was concerned about side effects.  Ultimately he agreed to catheter ablation.  This is scheduled for February.  11/22/2022  Mr. Nechama Guard returns today for follow-up.  Overall he says he is feeling wonderful.  He underwent A-fib ablation earlier this year by Dr. Nelly Laurence.  Subsequently had cardioversion and has been maintaining sinus rhythm since then.  Renal function has improved to the extent that he is now back on Eliquis 5 mg twice daily.  EKG today shows that he is maintaining sinus rhythm.  His lipids were recently assessed which showed a LDL cholesterol of 53 back in April.  He has denied any  chest pain or shortness of breath with exertion.  He does have some pulmonary fibrosis/scarring from COVID-19.  He is to undergo pulmonary rehabilitation to help with this.  PMHx:  Past Medical History:  Diagnosis Date   Acute gout of left ankle    06-14-2015   Bladder cancer (HCC)    BCG's tx's   BPH (benign prostatic hypertrophy)    CAD (coronary artery disease) CARDIOLOGIST-  DR TILLEY   a. NSTEMI 12/2014 -  99% dLAD-2 (not amenable to PCI), 50% dLAD-1, 60% mLAD. Medical therapy was recommended.   Cancer of kidney (HCC)    left   Cough    HARSH NONPRODUCTIVE COUGH 1 AND 1/2 WEEKS   GERD (gastroesophageal reflux disease)    takes  OTC periodically   Gilbert's syndrome    H/O cardiac catheterization    a. Note: Difficult radial access in 12/2014 - recommend femoral approach if cath needed in the future.   History of kidney stones    History of non-ST elevation myocardial infarction (NSTEMI)    12-12-2014   CARDIAC CATH W/ NO INTERVENTION X 2FEW DAYS APART   History of spinal fracture 2002   LUMBAR   Hyperlipidemia    Hypertension    Hypothyroidism    Myocardial infarction (HCC)    OSA on CPAP    SET ON 7 USES SOME NIGHTS   Paroxysmal A-fib [facility])    cardiologist-  dr Donnie Aho   Prostate cancer [facility]) 2019   last PSA  2.9  (montiored by urologist  dr Mena Goes) Vanessa Kick AND FEB 2019 RAD DONE   Shingles    2 weeks ago   Wears glasses     Past Surgical History:  Procedure Laterality Date   ATRIAL FIBRILLATION ABLATION N/A 04/01/2022   Procedure: ATRIAL FIBRILLATION ABLATION;  Surgeon: Maurice Small, MD;  Location: [facility] INVASIVE CV LAB;  Service: Cardiovascular;  Laterality: N/A;   BACK SURGERY  2014   LOWER l 1, 2 4 AND 5   BILATERAL INGUINAL HERNIA REPAIR     CARDIAC CATHETERIZATION N/A 12/13/2014   Procedure: Left Heart Cath and Coronary Angiography;  Surgeon: Marykay Lex, MD;  Location: [facility] INVASIVE CV LAB;  Service: Cardiovascular;  Laterality: N/A;  culprit lesion diat  LAD-2  99%, not approachable via PCTA  due to extremely tortuous up stream LAD/  mLAD 60% and dLAD-1 50%, both are at the extremely tortuous segment and not PCI targets/  otherwise normal coronary arteries  CARDIOVERSION N/A 08/09/2022   Procedure: CARDIOVERSION;  Surgeon: Pricilla Riffle, MD;  Location: [facility] INVASIVE CV LAB;  Service: Cardiovascular;  Laterality: N/A;   COLONOSCOPY WITH PROPOFOL N/A 01/16/2019   Procedure: COLONOSCOPY WITH PROPOFOL;  Surgeon: Vida Rigger, MD;  Location: [facility] ENDOSCOPY;  Service: Endoscopy;  Laterality: N/A;   CYSTOSCOPY W/ RETROGRADES Left 08/22/2012   Procedure: CYSTOSCOPY WITH RETROGRADE PYELOGRAM;  left kidney washings;  Surgeon: Antony Haste, MD;  Location: [facility];  Service: Urology;  Laterality: Left;   CYSTOSCOPY W/ RETROGRADES N/A 07/08/2015   Procedure: CYSTOSCOPY WITH RETROGRADE PYELOGRAM;  Surgeon: Jerilee Field, MD;  Location: [facility];  Service: Urology;  Laterality: N/A;   CYSTOSCOPY W/ URETERAL STENT PLACEMENT Left 03/24/2019   Procedure: CYSTOSCOPY WITH RETROGRADE PYELOGRAM/URETERAL STENT PLACEMENT ureteroscopy biopsy of neoplasm;  Surgeon: Jerilee Field, MD;  Location: [facility];  Service: Urology;  Laterality: Left;   CYSTOSCOPY W/ URETERAL STENT PLACEMENT Left 04/08/2019   Procedure: CYSTOSCOPY cystogram clot evacuation left ureteroscopy clot evacuation left stent exchange liimited transuretheral resectio of prastate and fulguration  bleeders of prostate;  Surgeon: Jerilee Field, MD;  Location: [facility];  Service: Urology;  Laterality: Left;   CYSTOSCOPY WITH BIOPSY  08/22/2012   Procedure: CYSTOSCOPY WITH BIOPSY;  Surgeon: Antony Haste, MD;  Location: [facility];  Service: Urology;;   CYSTOSCOPY WITH BIOPSY N/A 11/08/2014   Procedure: CYSTOSCOPY/BIOPSPY BLADDER INSTILLATION OF MARCAINE AND PYRIDIUM;  Surgeon: Jerilee Field, MD;  Location: [facility];  Service:  Urology;  Laterality: N/A;   CYSTOSCOPY WITH BIOPSY N/A 07/08/2015   Procedure: CYSTOSCOPY WITH BLADDER BIOPSY, FULGURATION, RETROGRADE PYLOGRAM AND RENAL WASHING;  Surgeon: Jerilee Field, MD;  Location[facility];  Service: Urology;  Laterality: N/A;   CYSTOSCOPY WITH RETROGRADE PYELOGRAM, URETEROSCOPY AND STENT PLACEMENT Bilateral 07/30/2014   Procedure: CYSTOSCOPY WITH BLADDER BX FULERGATION AND BILATERAL RETROGRADE PYELOGRAM,;  Surgeon: Jerilee Field, MD;  Location: [facility];  Service: Urology;  Laterality: Bilateral;   CYSTOSCOPY WITH STENT PLACEMENT Left 03/24/2018   Procedure: STENT PLACEMENT AND BIOPSY;  Surgeon: Jerilee Field, MD;  Location: [facility];  Service: Urology;  Laterality: Left;   CYSTOSCOPY/RETROGRADE/URETEROSCOPY Bilateral 08/17/2013   Procedure: CYSTOSCOPY, BLADDER BIOPSY WITH BILATERAL RETROGRADE PYELOGRAM, LEFT URETEROSCOPY AND DILATION OF STRICTURE ;  Surgeon: Jerilee Field, MD;  Location: [facility];  Service: Urology;  Laterality: Bilateral;   CYSTOSCOPY/RETROGRADE/URETEROSCOPY Left 03/24/2018   Procedure: CYSTOSCOPY/RETROGRADE/URETEROSCOPY;  Surgeon: Jerilee Field, MD;  Location: [facility]  Service: Urology;  Laterality: Left;   HOT HEMOSTASIS N/A 01/16/2019   Procedure: HOT HEMOSTASIS (ARGON PLASMA COAGULATION/BICAP);  Surgeon: Vida Rigger, MD;  Location: [facility];  Service: Endoscopy;  Laterality: N/A;   INSERTION OF MESH N/A 08/20/2020   Procedure: INSERTION OF MESH;  Surgeon: Axel Filler, MD;  Location: [facility] OR;  Service: General;  Laterality: N/A;   LEFT ATRIAL APPENDAGE OCCLUSION N/A 01/09/2015   Procedure: LEFT ATRIAL APPENDAGE OCCLUSION;  Surgeon: Hillis Range, MD;  [facility]: [facility] CV LAB;  Service: Cardiovascular;  Laterality: N/A;   LEFT HEART CATH AND CORONARY ANGIOGRAPHY N/A 11/20/2021   Procedure: LEFT HEART CATH AND CORONARY ANGIOGRAPHY;  Surgeon: Tonny Bollman, MD;   Location: [facility] INVASIVE CV LAB;  Service: Cardiovascular;  Laterality: N/A;   LUMBAR LAMINECTOMY/DECOMPRESSION MICRODISCECTOMY Left 12/03/2013   Procedure: Left Lumbar three-four diskectomy with Lumbar four laminectomy ;  Surgeon: Tressie Stalker, MD;  Location: [facility] NE[facility]O [facility];  Service: Neurosurgery;  Laterality: Left;  Left Lumbar three-four diskectomy with Lumbar four laminectomy    LYMPH NODE DISSECTION Bilateral 04/13/2019   Procedure: LYMPH NODE DISSECTION;  Surgeon: Sebastian Ache, MD;  Location: [facility];  Service: Urology;  Laterality: Bilateral;   ORIF LEFT ANKLE FX  2005   POLYPECTOMY  01/16/2019   Procedure: POLYPECTOMY;  Surgeon: Vida Rigger, MD;  Location[facility];  Service: Endoscopy;;   PROSTATE BIOPSY N/A 08/22/2012   Procedure: BIOPSY TRANSRECTAL ULTRASONIC PROSTATE (TUBP);  Surgeon: Antony Haste, MD;  Location: [facility];  Service: Urology;  Laterality: N/A;   ROBOT ASSITED LAPAROSCOPIC NEPHROURETERECTOMY Left 04/13/2019   Procedure: XI ROBOT ASSITED LAPAROSCOPIC NEPHROURETERECTOMY;  Surgeon: Sebastian Ache, MD;  Location: [facility];  Service: Urology;  Laterality: Left;  3 HRS   TEE WITHOUT CARDIOVERSION N/A 12/31/2014   Procedure: TRANSESOPHAGEAL ECHOCARDIOGRAM (TEE);  Surgeon: Chrystie Nose, MD;  Location: [facility] ENDOSCOPY;  Service: Cardiovascular;  Laterality: N/A;  ef 55-60%/  mild MR, TR, and PR/  mild LAE and RAE   TEE WITHOUT CARDIOVERSION N/A 04/01/2022   Procedure: TRANSESOPHAGEAL ECHOCARDIOGRAM;  Surgeon: Maurice Small, MD;  Location: [facility];  Service: Cardiovascular;  Laterality: N/A;   TRANSURETHRAL RESECTION OF BLADDER TUMOR  10/22/2011   Procedure: TRANSURETHRAL RESECTION OF BLADDER TUMOR (TURBT);  Surgeon: Antony Haste, MD;  Location: [facility];  Service: Urology;  Laterality: N/A;  TURBT, LEFT URETEROSCOPY, POSSIBLE URETERAL STENT    URETEROSCOPY  10/22/2011   Procedure: URETEROSCOPY;  Surgeon:  Antony Haste, MD;  Location: [facility];  Service: Urology;  Laterality: Left;   WISDOM TOOTH EXTRACTION     XI ROBOTIC ASSISTED VENTRAL HERNIA N/A 08/20/2020   Procedure: ROBOTIC INCISIONAL HERNIA REPAIR WITH MESH;  Surgeon: Axel Filler, MD;  Location: [facility] OR;  Service: General;  Laterality: N/A;    FAMHx:  Family History  Problem Relation Age of Onset   Leukemia Mother    Heart attack Father    Cancer Sister        breast   Hypertension Neg Hx    Stroke Neg Hx    Colon cancer Neg Hx    Esophageal cancer Neg Hx    Rectal cancer Neg Hx    Stomach cancer Neg Hx     SOCHx:   reports that he has never smoked. He has never used smokeless tobacco. He reports current alcohol use of about 7.0 standard drinks of alcohol per week. He reports that he does not use drugs.  ALLERGIES:  Allergies  Allergen Reactions   Bee Venom Anaphylaxis    Actually Hornets and Wasps.    Lisinopril Cough   Losartan Cough        Oxycodone Other (See Comments)    ALTERED MENTAL STATUS GETS MEAN    ROS: Pertinent items noted in HPI and remainder of comprehensive ROS otherwise negative.  HOME MEDS: Current Outpatient Medications on File Prior to Visit  Medication Sig Dispense Refill   acetaminophen (TYLENOL) 500 MG tablet Take 1,000 mg by mouth every 6 (six) hours as needed for moderate pain or headache.     allopurinol (ZYLOPRIM) 300 MG tablet Take 300 mg by mouth every morning.      apixaban (ELIQUIS) 5 MG TABS tablet Take 1 tablet (5 mg total) by mouth 2 (two) times daily. 60 tablet 11   aspirin EC 81 MG tablet Take 81 mg by mouth every evening.     Cholecalciferol (VITAMIN  D-3) 125 MCG (5000 UT) TABS Take 5,000 Units by mouth daily.     doxycycline (MONODOX) 50 MG capsule Take 50 mg by mouth daily.     EPINEPHrine 0.3 mg/0.3 mL IJ SOAJ injection Inject 0.3 mLs (0.3 mg total) into the muscle once. (Patient taking differently: Inject 0.3 mg into the muscle as needed  for anaphylaxis.) 2 Device 1   GLUCOSAMINE SULFATE PO Take 750 mg by mouth daily.     ipratropium (ATROVENT) 0.03 % nasal spray Place 1 spray into both nostrils 2 (two) times daily.     levothyroxine (SYNTHROID, LEVOTHROID) 50 MCG tablet Take 50 mcg by mouth daily before breakfast.     metoprolol tartrate (LOPRESSOR) 25 MG tablet Take 1 tablet (25 mg total) by mouth 2 (two) times daily. 180 tablet 2   nitroGLYCERIN (NITROSTAT) 0.4 MG SL tablet Place 1 tablet (0.4 mg total) under the tongue every 5 (five) minutes x 3 doses as needed for chest pain. 25 tablet 12   rosuvastatin (CRESTOR) 20 MG tablet TAKE ONE TABLET BY MOUTH DAILY 90 tablet 3   Testosterone 1.62 % GEL Apply 2 Pump topically every other day. Applied to shoulder area     No current facility-administered medications on file prior to visit.    LABS/IMAGING: No results found for this or any previous visit (from the past 48 hour(s)). No results found.  LIPID PANEL:    Component Value Date/Time   CHOL 111 09/01/2021 0947   TRIG 90 09/01/2021 0947   HDL 38 (L) 09/01/2021 0947   CHOLHDL 2.9 09/01/2021 0947   CHOLHDL 4.4 12/12/2014 1721   VLDL 18 12/12/2014 1721   LDLCALC 55 09/01/2021 0947   LDLDIRECT 152 (H) 05/14/2021 1428     WEIGHTS: Wt Readings from Last 3 Encounters:  11/22/22 191 lb (86.6 kg)  10/27/22 189 lb (85.7 kg)  09/14/22 184 lb (83.5 kg)    VITALS: BP 114/78 (BP Location: Left Arm, Patient Position: Sitting, Cuff Size: Normal)   Pulse 74   Ht 5\' 7"  (1.702 m)   Wt 191 lb (86.6 kg)   BMI 29.91 kg/m   EXAM: General appearance: alert and no distress Neck: no carotid bruit, no JVD and thyroid not enlarged, symmetric, no tenderness/mass/nodules Lungs: rales bibasilar Heart: regular rate and rhythm Abdomen: soft, non-tender; bowel sounds normal; no masses,  no organomegaly and obese Extremities: extremities normal, atraumatic, no cyanosis or edema Pulses: 2+ and symmetric Skin: Skin color, texture,  turgor normal. No rashes or lesions Neurologic: Grossly normal : Pleasant  EKG: EKG Interpretation Date/Time:  Monday November 22 2022 13:03:14 EDT Ventricular Rate:  74 PR Interval:  190 QRS Duration:  92 QT Interval:  396 QTC Calculation: 439 R Axis:   38  Text Interpretation: Sinus rhythm with occasional Premature ventricular complexes and Premature atrial complexes When compared with ECG of 14-Sep-2022 13:44, Premature ventricular complexes are now Present Premature atrial complexes are now Present Borderline criteria for Inferior infarct are no longer Present Confirmed by Zoila Shutter [phone]) on 11/22/2022 1:13:30 PM    ASSESSMENT: PAF status post catheter ablation (2024) History of NSTEMI - found to have multivessel coronary artery disease, including 99% LAD distal stenosis (not amenable to PCI) and 50 to 60% mid LAD stenosis of a tortuous vessel (12/2014), without angina History of bladder CA with hematuria, resolved Failed Watchman LAA procedure in 2016 History of hypertensive heart disease PAF - CHADVASC score of 5, on Eliquis Obesity Hyperlipidemia OSA on CPAP History of  TIA/stroke Hypothyroidism Possible left upper tract urothelial carcinoma-status post resection and radical left nephro ureterectomy. Interstitial lung disease -multiple COVID-19 infections Frequent PVCs -ventricular bigeminy  PLAN: 1.   Mr. Holway is feeling well without chest pain or shortness of breath.  He has had no recurrent A-fib recently.  He is compliant with CPAP.  Blood pressure is well-controlled.  LDL is at target less than 70.  Will continue current medications.  Follow-up with me annually or sooner as necessary.  Chrystie Nose, MD, [facility], FACP  [facility]  Medical Director of the [facility] Lipid [facility]  Cardiovascular Risk Reduction Clinic Diplomate of the American Board of Clinical Lipidology Attending Cardiologist  Direct Dial: [phone]  Fax:  [phone]  Website:  [email or web address] Nicely Bryndle Corredor 11/22/2022, 1:13 PM

## 2022-11-26 ENCOUNTER — Telehealth (HOSPITAL_COMMUNITY): Payer: Self-pay

## 2022-11-26 NOTE — Telephone Encounter (Signed)
Called pt to confirm appointment for pulmonary rehab orientation on 11/29/22. Pt did not answer. Voicemail was left.

## 2022-11-29 ENCOUNTER — Encounter (HOSPITAL_COMMUNITY)
Admission: RE | Admit: 2022-11-29 | Discharge: 2022-11-29 | Disposition: A | Discharge status: Home / Self Care | Payer: Medicare Other | Source: Ambulatory Visit | Attending: Pulmonary Disease | Admitting: Pulmonary Disease

## 2022-11-29 ENCOUNTER — Encounter (HOSPITAL_COMMUNITY): Payer: Self-pay

## 2022-11-29 VITALS — BP 104/60 | HR 79 | Ht 67.5 in | Wt 191.4 lb

## 2022-11-29 DIAGNOSIS — C44319 Basal cell carcinoma of skin of other parts of face: Secondary | ICD-10-CM | POA: Diagnosis not present

## 2022-11-29 DIAGNOSIS — J849 Interstitial pulmonary disease, unspecified: Secondary | ICD-10-CM | POA: Insufficient documentation

## 2022-11-29 DIAGNOSIS — D492 Neoplasm of unspecified behavior of bone, soft tissue, and skin: Secondary | ICD-10-CM | POA: Diagnosis not present

## 2022-11-29 NOTE — Progress Notes (Signed)
Pulmonary Rehab Orientation Physical Assessment Note  Physical assessment reveals patient is alert and oriented x 4. Pt is HOH with cochlear implant. Heart rate is normal, breath sounds clear to auscultation, no wheezes, rales, or rhonchi. Pt reports non-productive chronic cough. Bowel sounds present x4 quads. Pt denies abdominal discomfort, nausea, vomiting, diarrhea or constipation. Grip strength equal, strong. Distal pulses +2; +1 swelling to lower extremities.   Essie Hart, RN, BSN

## 2022-11-29 NOTE — Progress Notes (Signed)
Charles Wright 87 y.o. male Pulmonary Rehab Orientation Note This patient who was referred to Pulmonary Rehab by Dr. Isaiah Serge with the diagnosis of ILD arrived today in Cardiac and Pulmonary Rehab. He arrived ambulatory with normal gait. He does not carry portable oxygen. Per patient, Charles Wright uses oxygen never. Color good, skin warm and dry. Patient is oriented to time and place. Patient's medical history, psychosocial health, and medications reviewed. Psychosocial assessment reveals patient lives with spouse. Charles Wright is currently retired. Patient hobbies include maintaining yard and active church member. Patient reports his stress level is low. Areas of stress/anxiety include family. Patient does not exhibit signs of depression. PHQ2/9 score 0/0. Charles Wright shows good  coping skills with positive outlook on life. Offered emotional support and reassurance. Will continue to monitor. Physical assessment performed by Charles Hart RN. Please see their orientation physical assessment note. Charles Wright reports he  does take medications as prescribed. Patient states he  follows a regular  diet. The patient reports no specific efforts to gain or lose weight.. Patient's weight will be monitored closely. Demonstration and practice of PLB using pulse oximeter. Charles Wright able to return demonstration satisfactorily. Safety and hand hygiene in the exercise area reviewed with patient. Charles Wright voices understanding of the information reviewed. Department expectations discussed with patient and achievable goals were set. The patient shows enthusiasm about attending the program and we look forward to working with Charles Wright. Charles Wright completed a 6 min walk test today and is scheduled to begin exercise on 12/07/22 at 1:15 pm.   [id number] Joya San, [place], ACSM-CEP

## 2022-11-29 NOTE — Progress Notes (Signed)
Pulmonary Individual Treatment Plan  Patient Details  Name: Charles Wright MRN: [id number] Date of Birth: 01-06-1936 Referring Provider:   Doristine Devoid Pulmonary Rehab Walk Test from 11/29/2022 in [facility]  Referring Provider Mannam       Initial Encounter Date:  Flowsheet Row Pulmonary Rehab Walk Test from 11/29/2022 in [facility] for Heart, Vascular, [facility] Lung Health  Date 11/29/22       Visit Diagnosis: Interstitial lung disease (HCC)  Patient's Home Medications on Admission:   Current Outpatient Medications:    acetaminophen (TYLENOL) 500 MG tablet, Take 1,000 mg by mouth every 6 (six) hours as needed for moderate pain or headache., Disp: , Rfl:    allopurinol (ZYLOPRIM) 300 MG tablet, Take 300 mg by mouth every morning. , Disp: , Rfl:    apixaban (ELIQUIS) 5 MG TABS tablet, Take 1 tablet (5 mg total) by mouth 2 (two) times daily., Disp: 60 tablet, Rfl: 11   aspirin EC 81 MG tablet, Take 81 mg by mouth every evening., Disp: , Rfl:    Cholecalciferol (VITAMIN D-3) 125 MCG (5000 UT) TABS, Take 5,000 Units by mouth daily., Disp: , Rfl:    doxycycline (MONODOX) 50 MG capsule, Take 50 mg by mouth daily., Disp: , Rfl:    EPINEPHrine 0.3 mg/0.3 mL IJ SOAJ injection, Inject 0.3 mLs (0.3 mg total) into the muscle once. (Patient taking differently: Inject 0.3 mg into the muscle as needed for anaphylaxis.), Disp: 2 Device, Rfl: 1   GLUCOSAMINE SULFATE PO, Take 750 mg by mouth daily., Disp: , Rfl:    ipratropium (ATROVENT) 0.03 % nasal spray, Place 1 spray into both nostrils 2 (two) times daily., Disp: , Rfl:    levothyroxine (SYNTHROID, LEVOTHROID) 50 MCG tablet, Take 50 mcg by mouth daily before breakfast., Disp: , Rfl:    metoprolol tartrate (LOPRESSOR) 25 MG tablet, Take 1 tablet (25 mg total) by mouth 2 (two) times daily., Disp: 180 tablet, Rfl: 2   nitroGLYCERIN (NITROSTAT) 0.4 MG SL tablet, Place 1  tablet (0.4 mg total) under the tongue every 5 (five) minutes x 3 doses as needed for chest pain., Disp: 25 tablet, Rfl: 12   rosuvastatin (CRESTOR) 20 MG tablet, TAKE ONE TABLET BY MOUTH DAILY, Disp: 90 tablet, Rfl: 3   Testosterone 1.62 % GEL, Apply 2 Pump topically every other day. Applied to shoulder area, Disp: , Rfl:   Past Medical History: Past Medical History:  Diagnosis Date   Acute gout of left ankle    06-14-2015   Bladder cancer (HCC)    BCG's tx's   BPH (benign prostatic hypertrophy)    CAD (coronary artery disease) CARDIOLOGIST-  DR TILLEY   a. NSTEMI 12/2014 -  99% dLAD-2 (not amenable to PCI), 50% dLAD-1, 60% mLAD. Medical therapy was recommended.   Cancer of kidney (HCC)    left   Cough    HARSH NONPRODUCTIVE COUGH 1 AND 1/2 WEEKS   GERD (gastroesophageal reflux disease)    takes  OTC periodically   Gilbert's syndrome    H/O cardiac catheterization    a. Note: Difficult radial access in 12/2014 - recommend femoral approach if cath needed in the future.   History of kidney stones    History of non-ST elevation myocardial infarction (NSTEMI)    12-12-2014   CARDIAC CATH W/ NO INTERVENTION X 2FEW DAYS APART   History of spinal fracture 2002   LUMBAR  Hyperlipidemia    Hypertension    Hypothyroidism    Myocardial infarction (HCC)    OSA on CPAP    SET ON 7 USES SOME NIGHTS   Paroxysmal A-fib [facility])    cardiologist-  dr Donnie Aho   Prostate cancer [facility]) 2019   last PSA  2.9  (montiored by urologist  dr Mena Goes) Vanessa Kick AND FEB 2019 RAD DONE   Shingles    2 weeks ago   Wears glasses     Tobacco Use: Social History   Tobacco Use  Smoking Status Never  Smokeless Tobacco Never    Labs: Review Flowsheet  More data exists      Latest Ref Rng & Units 12/12/2014 10/10/2018 04/08/2019 05/14/2021 09/01/2021  Labs for ITP Cardiac and Pulmonary Rehab  Cholestrol 100 - 199 mg/dL 829  562  - - 130   LDL (calc) 0 - 99 mg/dL 865  784  - - 55   Direct LDL 0 - 99 mg/dL - - -  696  -  HDL-C >29 mg/dL 45  42  - - 38   Trlycerides 0 - 149 mg/dL 88  528  - - 90   Bicarbonate 20.0 - 28.0 mmol/L - - 24.6  - -  TCO2 22 - 32 mmol/L - - 26  - -  Acid-base deficit 0.0 - 2.0 mmol/L - - 2.0  - -  O2 Saturation % - - 99.0  - -    Details            Capillary Blood Glucose: No results found for: "GLUCAP"   Pulmonary Assessment Scores:  Pulmonary Assessment Scores     Row Name 11/29/22 0841         ADL UCSD   ADL Phase Entry     SOB Score total 22       CAT Score   CAT Score 18       mMRC Score   mMRC Score 1             UCSD: Self-administered rating of dyspnea associated with activities of daily living (ADLs) 6-point scale (0 = "not at all" to 5 = "maximal or unable to do because of breathlessness")  Scoring Scores range from 0 to 120.  Minimally important difference is 5 units  CAT: CAT can identify the health impairment of COPD patients and is better correlated with disease progression.  CAT has a scoring range of zero to 40. The CAT score is classified into four groups of low (less than 10), medium (10 - 20), high (21-30) and very high (31-40) based on the impact level of disease on health status. A CAT score over 10 suggests significant symptoms.  A worsening CAT score could be explained by an exacerbation, poor medication adherence, poor inhaler technique, or progression of COPD or comorbid conditions.  CAT MCID is 2 points  mMRC: mMRC (Modified Medical Research Council) Dyspnea Scale is used to assess the degree of baseline functional disability in patients of respiratory disease due to dyspnea. No minimal important difference is established. A decrease in score of 1 point or greater is considered a positive change.   Pulmonary Function Assessment:  Pulmonary Function Assessment - 11/29/22 0832       Breath   Bilateral Breath Sounds Clear    Shortness of Breath Yes;Limiting activity             Exercise Target Goals: Exercise  Program Goal: Individual exercise prescription set using results from  initial 6 min walk test and THRR while considering  patient's activity barriers and safety.   Exercise Prescription Goal: Initial exercise prescription builds to 30-45 minutes a day of aerobic activity, 2-3 days per week.  Home exercise guidelines will be given to patient during program as part of exercise prescription that the participant will acknowledge.  Activity Barriers & Risk Stratification:  Activity Barriers & Cardiac Risk Stratification - 11/29/22 0833       Activity Barriers & Cardiac Risk Stratification   Activity Barriers Neck/Spine Problems;Deconditioning;Muscular Weakness;Shortness of Breath;Joint Problems;Back Problems             6 Minute Walk:  6 Minute Walk     Row Name 11/29/22 0956         6 Minute Walk   Phase Initial     Distance 1250 feet     Walk Time 6 minutes     # of Rest Breaks 0     MPH 2.37     METS 1.67     RPE 9     Perceived Dyspnea  1     VO2 Peak 5.85     Symptoms No     Resting HR 79 bpm     Resting BP 104/60     Resting Oxygen Saturation  95 %     Exercise Oxygen Saturation  during 6 min walk 91 %     Max Ex. HR 93 bpm     Max Ex. BP 110/60     2 Minute Post BP 118/68  taken after H2O given       Interval HR   1 Minute HR 81     2 Minute HR 87     3 Minute HR 90     4 Minute HR 90     5 Minute HR 93     6 Minute HR 78     2 Minute Post HR 77     Interval Heart Rate? Yes       Interval Oxygen   Interval Oxygen? Yes     Baseline Oxygen Saturation % 95 %     1 Minute Oxygen Saturation % 95 %     1 Minute Liters of Oxygen 0 L     2 Minute Oxygen Saturation % 92 %     2 Minute Liters of Oxygen 0 L     3 Minute Oxygen Saturation % 92 %     3 Minute Liters of Oxygen 0 L     4 Minute Oxygen Saturation % 91 %     4 Minute Liters of Oxygen 0 L     5 Minute Oxygen Saturation % 92 %     5 Minute Liters of Oxygen 0 L     6 Minute Oxygen Saturation % 93 %      6 Minute Liters of Oxygen 0 L     2 Minute Post Oxygen Saturation % 98 %     2 Minute Post Liters of Oxygen 0 L              Oxygen Initial Assessment:  Oxygen Initial Assessment - 11/29/22 0832       Home Oxygen   Home Oxygen Device None    Sleep Oxygen Prescription CPAP    Home Exercise Oxygen Prescription None    Home Resting Oxygen Prescription None    Compliance with Home Oxygen Use No      Initial 6 min Walk  Oxygen Used None      Program Oxygen Prescription   Program Oxygen Prescription None      Intervention   Short Term Goals To learn and exhibit compliance with exercise, home and travel O2 prescription;To learn and understand importance of maintaining oxygen saturations>88%;To learn and demonstrate proper use of respiratory medications;To learn and understand importance of monitoring SPO2 with pulse oximeter and demonstrate accurate use of the pulse oximeter.;To learn and demonstrate proper pursed lip breathing techniques or other breathing techniques.     Long  Term Goals Exhibits compliance with exercise, home  and travel O2 prescription;Verbalizes importance of monitoring SPO2 with pulse oximeter and return demonstration;Maintenance of O2 saturations>88%;Exhibits proper breathing techniques, such as pursed lip breathing or other method taught during program session;Compliance with respiratory medication;Demonstrates proper use of MDI's             Oxygen Re-Evaluation:   Oxygen Discharge (Final Oxygen Re-Evaluation):   Initial Exercise Prescription:  Initial Exercise Prescription - 11/29/22 0900       Date of Initial Exercise RX and Referring Provider   Date 11/29/22    Referring Provider Mannam    Expected Discharge Date 02/23/22      Recumbant Elliptical   Level 2    RPM 55    Watts 80    Minutes 15      Track   Minutes 15    METs 2      Prescription Details   Frequency (times per week) 2    Duration Progress to 30 minutes of  continuous aerobic without signs/symptoms of physical distress      Intensity   THRR 40-80% of Max Heartrate 54-108    Ratings of Perceived Exertion 11-13    Perceived Dyspnea 0-4      Progression   Progression Continue to progress workloads to maintain intensity without signs/symptoms of physical distress.      Resistance Training   Training Prescription Yes    Weight blue bands             Perform Capillary Blood Glucose checks as needed.  Exercise Prescription Changes:   Exercise Comments:   Exercise Goals and Review:   Exercise Goals     Row Name 11/29/22 0833             Exercise Goals   Increase Physical Activity Yes       Intervention Provide advice, education, support and counseling about physical activity/exercise needs.;Develop an individualized exercise prescription for aerobic and resistive training based on initial evaluation findings, risk stratification, comorbidities and participant's personal goals.       Expected Outcomes Short Term: Attend rehab on a regular basis to increase amount of physical activity.;Long Term: Add in home exercise to make exercise part of routine and to increase amount of physical activity.;Long Term: Exercising regularly at least 3-5 days a week.       Increase Strength and Stamina Yes       Intervention Provide advice, education, support and counseling about physical activity/exercise needs.;Develop an individualized exercise prescription for aerobic and resistive training based on initial evaluation findings, risk stratification, comorbidities and participant's personal goals.       Expected Outcomes Short Term: Increase workloads from initial exercise prescription for resistance, speed, and METs.;Short Term: Perform resistance training exercises routinely during rehab and add in resistance training at home;Long Term: Improve cardiorespiratory fitness, muscular endurance and strength as measured by increased METs and functional  capacity ( )  Able to understand and use rate of perceived exertion (RPE) scale Yes       Intervention Provide education and explanation on how to use RPE scale       Expected Outcomes Short Term: Able to use RPE daily in rehab to express subjective intensity level;Long Term:  Able to use RPE to guide intensity level when exercising independently       Able to understand and use Dyspnea scale Yes       Intervention Provide education and explanation on how to use Dyspnea scale       Expected Outcomes Short Term: Able to use Dyspnea scale daily in rehab to express subjective sense of shortness of breath during exertion;Long Term: Able to use Dyspnea scale to guide intensity level when exercising independently       Knowledge and understanding of Target Heart Rate Range (THRR) Yes       Intervention Provide education and explanation of THRR including how the numbers were predicted and where they are located for reference       Expected Outcomes Short Term: Able to state/look up THRR;Long Term: Able to use THRR to govern intensity when exercising independently;Short Term: Able to use daily as guideline for intensity in rehab       Understanding of Exercise Prescription Yes       Intervention Provide education, explanation, and written materials on patient's individual exercise prescription       Expected Outcomes Short Term: Able to explain program exercise prescription;Long Term: Able to explain home exercise prescription to exercise independently                Exercise Goals Re-Evaluation :   Discharge Exercise Prescription (Final Exercise Prescription Changes):   Nutrition:  Target Goals: Understanding of nutrition guidelines, daily intake of sodium 1500mg , cholesterol 200mg , calories 30% from fat and 7% or less from saturated fats, daily to have 5 or more servings of fruits and vegetables.  Biometrics:  Pre Biometrics - 11/29/22 0828       Pre Biometrics   Grip Strength  30 kg              Nutrition Therapy Plan and Nutrition Goals:   Nutrition Assessments:  MEDIFICTS Score Key: >=70 Need to make dietary changes  40-70 Heart Healthy Diet <= 40 Therapeutic Level Cholesterol Diet   Picture Your Plate Scores: <75 Unhealthy dietary pattern with much room for improvement. 41-50 Dietary pattern unlikely to meet recommendations for good health and room for improvement. 51-60 More healthful dietary pattern, with some room for improvement.  >60 Healthy dietary pattern, although there may be some specific behaviors that could be improved.    Nutrition Goals Re-Evaluation:   Nutrition Goals Discharge (Final Nutrition Goals Re-Evaluation):   Psychosocial: Target Goals: Acknowledge presence or absence of significant depression and/or stress, maximize coping skills, provide positive support system. Participant is able to verbalize types and ability to use techniques and skills needed for reducing stress and depression.  Initial Review & Psychosocial Screening:  Initial Psych Review & Screening - 11/29/22 0834       Initial Review   Current issues with None Identified      Family Dynamics   Good Support System? Yes    Comments spouse      Barriers   Psychosocial barriers to participate in program There are no identifiable barriers or psychosocial needs.      Screening Interventions   Interventions Encouraged to exercise  Quality of Life Scores:  Scores of 19 and below usually indicate a poorer quality of life in these areas.  A difference of  2-3 points is a clinically meaningful difference.  A difference of 2-3 points in the total score of the Quality of Life Index has been associated with significant improvement in overall quality of life, self-image, physical symptoms, and general health in studies assessing change in quality of life.  PHQ-9: Review Flowsheet  More data exists      11/29/2022 06/01/2017 05/04/2017  10/07/2016 12/24/2014  Depression screen PHQ 2/9  Decreased Interest 0 0 0 0 0  Down, Depressed, Hopeless 0 0 0 0 0  PHQ - 2 Score 0 0 0 0 0  Altered sleeping 0 - - - -  Tired, decreased energy 0 - - - -  Change in appetite 0 - - - -  Feeling bad or failure about yourself  0 - - - -  Trouble concentrating 0 - - - -  Moving slowly or fidgety/restless 0 - - - -  Suicidal thoughts 0 - - - -  PHQ-9 Score 0 - - - -  Difficult doing work/chores Not difficult at all - - - -    Details           Interpretation of Total Score  Total Score Depression Severity:  1-4 = Minimal depression, 5-9 = Mild depression, 10-14 = Moderate depression, 15-19 = Moderately severe depression, 20-27 = Severe depression   Psychosocial Evaluation and Intervention:  Psychosocial Evaluation - 11/29/22 0834       Psychosocial Evaluation & Interventions   Interventions Encouraged to exercise with the program and follow exercise prescription    Comments Pt denies any psychosocial needs at this time.    Expected Outcomes For [place] to participate in rehab free of psychosocial barriers.    Continue Psychosocial Services  No Follow up required             Psychosocial Re-Evaluation:   Psychosocial Discharge (Final Psychosocial Re-Evaluation):   Education: Education Goals: Education classes will be provided on a weekly basis, covering required topics. Participant will state understanding/return demonstration of topics presented.  Learning Barriers/Preferences:  Learning Barriers/Preferences - 11/29/22 0835       Learning Barriers/Preferences   Learning Barriers Hearing    Learning Preferences Skilled Demonstration             Education Topics: Know Your Numbers Group instruction that is supported by a PowerPoint presentation. Instructor discusses importance of knowing and understanding resting, exercise, and post-exercise oxygen saturation, heart rate, and blood pressure. Oxygen saturation,  heart rate, blood pressure, rating of perceived exertion, and dyspnea are reviewed along with a normal range for these values.    Exercise for the Pulmonary Patient Group instruction that is supported by a PowerPoint presentation. Instructor discusses benefits of exercise, core components of exercise, frequency, duration, and intensity of an exercise routine, importance of utilizing pulse oximetry during exercise, safety while exercising, and options of places to exercise outside of rehab.    MET Level  Group instruction provided by PowerPoint, verbal discussion, and written material to support subject matter. Instructor reviews what METs are and how to increase METs.    Pulmonary Medications Verbally interactive group education provided by instructor with focus on inhaled medications and proper administration.   Anatomy and Physiology of the Respiratory System Group instruction provided by PowerPoint, verbal discussion, and written material to support subject matter. Instructor reviews respiratory cycle and  anatomical components of the respiratory system and their functions. Instructor also reviews differences in obstructive and restrictive respiratory diseases with examples of each.    Oxygen Safety Group instruction provided by PowerPoint, verbal discussion, and written material to support subject matter. There is an overview of "What is Oxygen" and "Why do we need it".  Instructor also reviews how to create a safe environment for oxygen use, the importance of using oxygen as prescribed, and the risks of noncompliance. There is a brief discussion on traveling with oxygen and resources the patient may utilize.   Oxygen Use Group instruction provided by PowerPoint, verbal discussion, and written material to discuss how supplemental oxygen is prescribed and different types of oxygen supply systems. Resources for more information are provided.    Breathing Techniques Group instruction that  is supported by demonstration and informational handouts. Instructor discusses the benefits of pursed lip and diaphragmatic breathing and detailed demonstration on how to perform both.     Risk Factor Reduction Group instruction that is supported by a PowerPoint presentation. Instructor discusses the definition of a risk factor, different risk factors for pulmonary disease, and how the heart and lungs work together.   Pulmonary Diseases Group instruction provided by PowerPoint, verbal discussion, and written material to support subject matter. Instructor gives an overview of the different type of pulmonary diseases. There is also a discussion on risk factors and symptoms as well as ways to manage the diseases.   Stress and Energy Conservation Group instruction provided by PowerPoint, verbal discussion, and written material to support subject matter. Instructor gives an overview of stress and the impact it can have on the body. Instructor also reviews ways to reduce stress. There is also a discussion on energy conservation and ways to conserve energy throughout the day.   Warning Signs and Symptoms Group instruction provided by PowerPoint, verbal discussion, and written material to support subject matter. Instructor reviews warning signs and symptoms of stroke, heart attack, cold and flu. Instructor also reviews ways to prevent the spread of infection.   Other Education Group or individual verbal, written, or video instructions that support the educational goals of the pulmonary rehab program.    Knowledge Questionnaire Score:  Knowledge Questionnaire Score - 11/29/22 0836       Knowledge Questionnaire Score   Pre Score 16/18             Core Components/Risk Factors/Patient Goals at Admission:  Personal Goals and Risk Factors at Admission - 11/29/22 0835       Core Components/Risk Factors/Patient Goals on Admission    Weight Management Weight Loss;Yes    Intervention Weight  Management: Develop a combined nutrition and exercise program designed to reach desired caloric intake, while maintaining appropriate intake of nutrient and fiber, sodium and fats, and appropriate energy expenditure required for the weight goal.;Weight Management: Provide education and appropriate resources to help participant work on and attain dietary goals.;Weight Management/Obesity: Establish reasonable short term and long term weight goals.;Obesity: Provide education and appropriate resources to help participant work on and attain dietary goals.    Expected Outcomes Short Term: Continue to assess and modify interventions until short term weight is achieved;Long Term: Adherence to nutrition and physical activity/exercise program aimed toward attainment of established weight goal;Weight Maintenance: Understanding of the daily nutrition guidelines, which includes 25-35% calories from fat, 7% or less cal from saturated fats, less than 200mg  cholesterol, less than 1.5gm of sodium, & 5 or more servings of fruits and vegetables daily;Weight Loss:  Understanding of general recommendations for a balanced deficit meal plan, which promotes 1-2 lb weight loss per week and includes a negative energy balance of [phone] kcal/d;Understanding of distribution of calorie intake throughout the day with the consumption of 4-5 meals/snacks;Understanding recommendations for meals to include 15-35% energy as protein, 25-35% energy from fat, 35-60% energy from carbohydrates, less than 200mg  of dietary cholesterol, 20-35 gm of total fiber daily    Improve shortness of breath with ADL's Yes    Intervention Provide education, individualized exercise plan and daily activity instruction to help decrease symptoms of SOB with activities of daily living.    Expected Outcomes Short Term: Improve cardiorespiratory fitness to achieve a reduction of symptoms when performing ADLs;Long Term: Be able to perform more ADLs without symptoms or delay  the onset of symptoms             Core Components/Risk Factors/Patient Goals Review:    Core Components/Risk Factors/Patient Goals at Discharge (Final Review):    ITP Comments: Dr. Mechele Collin is Medical Director for Pulmonary Rehab at [facility].

## 2022-12-07 ENCOUNTER — Encounter (HOSPITAL_COMMUNITY)
Admission: RE | Admit: 2022-12-07 | Discharge: 2022-12-07 | Disposition: A | Discharge status: Home / Self Care | Payer: Medicare Other | Source: Ambulatory Visit | Attending: Pulmonary Disease | Admitting: Pulmonary Disease

## 2022-12-07 VITALS — Wt 192.2 lb

## 2022-12-07 DIAGNOSIS — J849 Interstitial pulmonary disease, unspecified: Secondary | ICD-10-CM

## 2022-12-07 NOTE — Progress Notes (Signed)
Daily Session Note  Patient Details  Name: Charles Wright MRN: [id number] Date of Birth: 05/29/1935 Referring Provider:   Doristine Devoid Pulmonary Rehab Walk Test from 11/29/2022 in [facility]  Referring Provider Mannam       Encounter Date: 12/07/2022  Check In:  Session Check In - 12/07/22 1426       Check-In   Supervising physician immediately available to respond to emergencies [facility] MD immediately available    Physician(s) Robin Searing, NP    Location [facility]    Staff Present Essie Hart, RN, Doris Cheadle, MS, ACSM-CEP, Exercise Physiologist;Samantha Belarus, RD, LDN;Casey Chester Holstein, MS, Exercise Physiologist    Virtual Visit No    Medication changes reported     No    Fall or balance concerns reported    No    Tobacco Cessation No Change    Warm-up and Cool-down Performed as group-led instruction    Resistance Training Performed Yes    VAD Patient? No    PAD/SET Patient? No      Pain Assessment   Currently in Pain? No/denies    Multiple Pain Sites No             Capillary Blood Glucose: No results found for this or any previous visit (from the past 24 hour(s)).   Exercise Prescription Changes - 12/07/22 1500       Response to Exercise   Blood Pressure (Admit) 104/58    Blood Pressure (Exercise) 120/60    Blood Pressure (Exit) 94/52    Heart Rate (Admit) 71 bpm    Heart Rate (Exercise) 112 bpm    Heart Rate (Exit) 81 bpm    Oxygen Saturation (Admit) 97 %    Oxygen Saturation (Exercise) 90 %    Oxygen Saturation (Exit) 95 %    Rating of Perceived Exertion (Exercise) 9    Perceived Dyspnea (Exercise) 1    Duration Continue with 30 min of aerobic exercise without signs/symptoms of physical distress.    Intensity THRR unchanged      Progression   Progression Continue to progress workloads to maintain intensity without signs/symptoms of physical distress.       Resistance Training   Training Prescription Yes    Weight blue bands    Reps 10-15    Time 10 Minutes      Recumbant Elliptical   Level 2    RPM 60    Minutes 15    METs 3.2      Track   Laps 12    Minutes 15    METs 2.85             Social History   Tobacco Use  Smoking Status Never  Smokeless Tobacco Never    Goals Met:  Proper associated with RPD/PD & O2 Sat Exercise tolerated well No report of concerns or symptoms today Strength training completed today  Goals Unmet:  Not Applicable  Comments: Service time is from 1313 to 1438.    Dr. Mechele Collin is Medical Director for Pulmonary Rehab at [facility].

## 2022-12-08 NOTE — Progress Notes (Signed)
Pulmonary Individual Treatment Plan  Patient Details  Name: Charles Wright MRN: [id number] Date of Birth: 09-Jun-1935 Referring Provider:   Doristine Devoid Pulmonary Rehab Walk Test from 11/29/2022 in [facility]  Referring Provider Mannam       Initial Encounter Date:  Flowsheet Row Pulmonary Rehab Walk Test from 11/29/2022 in [facility]  Date 11/29/22       Visit Diagnosis: Interstitial lung disease (HCC)  Patient's Home Medications on Admission:   Current Outpatient Medications:    acetaminophen (TYLENOL) 500 MG tablet, Take 1,000 mg by mouth every 6 (six) hours as needed for moderate pain or headache., Disp: , Rfl:    allopurinol (ZYLOPRIM) 300 MG tablet, Take 300 mg by mouth every morning. , Disp: , Rfl:    apixaban (ELIQUIS) 5 MG TABS tablet, Take 1 tablet (5 mg total) by mouth 2 (two) times daily., Disp: 60 tablet, Rfl: 11   aspirin EC 81 MG tablet, Take 81 mg by mouth every evening., Disp: , Rfl:    Cholecalciferol (VITAMIN D-3) 125 MCG (5000 UT) TABS, Take 5,000 Units by mouth daily., Disp: , Rfl:    doxycycline (MONODOX) 50 MG capsule, Take 50 mg by mouth daily., Disp: , Rfl:    EPINEPHrine 0.3 mg/0.3 mL IJ SOAJ injection, Inject 0.3 mLs (0.3 mg total) into the muscle once. (Patient taking differently: Inject 0.3 mg into the muscle as needed for anaphylaxis.), Disp: 2 Device, Rfl: 1   GLUCOSAMINE SULFATE PO, Take 750 mg by mouth daily., Disp: , Rfl:    ipratropium (ATROVENT) 0.03 % nasal spray, Place 1 spray into both nostrils 2 (two) times daily., Disp: , Rfl:    levothyroxine (SYNTHROID, LEVOTHROID) 50 MCG tablet, Take 50 mcg by mouth daily before breakfast., Disp: , Rfl:    metoprolol tartrate (LOPRESSOR) 25 MG tablet, Take 1 tablet (25 mg total) by mouth 2 (two) times daily., Disp: 180 tablet, Rfl: 2   nitroGLYCERIN (NITROSTAT) 0.4 MG SL tablet, Place 1  tablet (0.4 mg total) under the tongue every 5 (five) minutes x 3 doses as needed for chest pain., Disp: 25 tablet, Rfl: 12   rosuvastatin (CRESTOR) 20 MG tablet, TAKE ONE TABLET BY MOUTH DAILY, Disp: 90 tablet, Rfl: 3   Testosterone 1.62 % GEL, Apply 2 Pump topically every other day. Applied to shoulder area, Disp: , Rfl:   Past Medical History: Past Medical History:  Diagnosis Date   Acute gout of left ankle    06-14-2015   Bladder cancer (HCC)    BCG's tx's   BPH (benign prostatic hypertrophy)    CAD (coronary artery disease) CARDIOLOGIST-  DR TILLEY   a. NSTEMI 12/2014 -  99% dLAD-2 (not amenable to PCI), 50% dLAD-1, 60% mLAD. Medical therapy was recommended.   Cancer of kidney (HCC)    left   Cough    HARSH NONPRODUCTIVE COUGH 1 AND 1/2 WEEKS   GERD (gastroesophageal reflux disease)    takes  OTC periodically   Gilbert's syndrome    H/O cardiac catheterization    a. Note: Difficult radial access in 12/2014 - recommend femoral approach if cath needed in the future.   History of kidney stones    History of non-ST elevation myocardial infarction (NSTEMI)    12-12-2014   CARDIAC CATH W/ NO INTERVENTION X 2FEW DAYS APART   History of spinal fracture 2002   LUMBAR  Hyperlipidemia    Hypertension    Hypothyroidism    Myocardial infarction (HCC)    OSA on CPAP    SET ON 7 USES SOME NIGHTS   Paroxysmal A-fib [facility])    cardiologist-  dr Donnie Aho   Prostate cancer [facility]) 2019   last PSA  2.9  (montiored by urologist  dr Mena Goes) Vanessa Kick AND FEB 2019 RAD DONE   Shingles    2 weeks ago   Wears glasses     Tobacco Use: Social History   Tobacco Use  Smoking Status Never  Smokeless Tobacco Never    Labs: Review Flowsheet  More data exists      Latest Ref Rng & Units 12/12/2014 10/10/2018 04/08/2019 05/14/2021 09/01/2021  Labs for ITP Cardiac and Pulmonary Rehab  Cholestrol 100 - 199 mg/dL 161  096  - - 045   LDL (calc) 0 - 99 mg/dL 409  811  - - 55   Direct LDL 0 - 99 mg/dL - - -  914  -  HDL-C >78 mg/dL 45  42  - - 38   Trlycerides 0 - 149 mg/dL 88  295  - - 90   Bicarbonate 20.0 - 28.0 mmol/L - - 24.6  - -  TCO2 22 - 32 mmol/L - - 26  - -  Acid-base deficit 0.0 - 2.0 mmol/L - - 2.0  - -  O2 Saturation % - - 99.0  - -    Details            Capillary Blood Glucose: No results found for: "GLUCAP"   Pulmonary Assessment Scores:  Pulmonary Assessment Scores     Row Name 11/29/22 0841         ADL UCSD   ADL Phase Entry     SOB Score total 22       CAT Score   CAT Score 18       mMRC Score   mMRC Score 1             UCSD: Self-administered rating of dyspnea associated with activities of daily living (ADLs) 6-point scale (0 = "not at all" to 5 = "maximal or unable to do because of breathlessness")  Scoring Scores range from 0 to 120.  Minimally important difference is 5 units  CAT: CAT can identify the health impairment of COPD patients and is better correlated with disease progression.  CAT has a scoring range of zero to 40. The CAT score is classified into four groups of low (less than 10), medium (10 - 20), high (21-30) and very high (31-40) based on the impact level of disease on health status. A CAT score over 10 suggests significant symptoms.  A worsening CAT score could be explained by an exacerbation, poor medication adherence, poor inhaler technique, or progression of COPD or comorbid conditions.  CAT MCID is 2 points  mMRC: mMRC (Modified Medical Research Council) Dyspnea Scale is used to assess the degree of baseline functional disability in patients of respiratory disease due to dyspnea. No minimal important difference is established. A decrease in score of 1 point or greater is considered a positive change.   Pulmonary Function Assessment:  Pulmonary Function Assessment - 11/29/22 0832       Breath   Bilateral Breath Sounds Clear    Shortness of Breath Yes;Limiting activity             Exercise Target Goals: Exercise  Program Goal: Individual exercise prescription set using results from  initial 6 min walk test and THRR while considering  patient's activity barriers and safety.   Exercise Prescription Goal: Initial exercise prescription builds to 30-45 minutes a day of aerobic activity, 2-3 days per week.  Home exercise guidelines will be given to patient during program as part of exercise prescription that the participant will acknowledge.  Activity Barriers & Risk Stratification:  Activity Barriers & Cardiac Risk Stratification - 11/29/22 0833       Activity Barriers & Cardiac Risk Stratification   Activity Barriers Neck/Spine Problems;Deconditioning;Muscular Weakness;Shortness of Breath;Joint Problems;Back Problems             6 Minute Walk:  6 Minute Walk     Row Name 11/29/22 0956         6 Minute Walk   Phase Initial     Distance 1250 feet     Walk Time 6 minutes     # of Rest Breaks 0     MPH 2.37     METS 1.67     RPE 9     Perceived Dyspnea  1     VO2 Peak 5.85     Symptoms No     Resting HR 79 bpm     Resting BP 104/60     Resting Oxygen Saturation  95 %     Exercise Oxygen Saturation  during 6 min walk 91 %     Max Ex. HR 93 bpm     Max Ex. BP 110/60     2 Minute Post BP 118/68  taken after H2O given       Interval HR   1 Minute HR 81     2 Minute HR 87     3 Minute HR 90     4 Minute HR 90     5 Minute HR 93     6 Minute HR 78     2 Minute Post HR 77     Interval Heart Rate? Yes       Interval Oxygen   Interval Oxygen? Yes     Baseline Oxygen Saturation % 95 %     1 Minute Oxygen Saturation % 95 %     1 Minute Liters of Oxygen 0 L     2 Minute Oxygen Saturation % 92 %     2 Minute Liters of Oxygen 0 L     3 Minute Oxygen Saturation % 92 %     3 Minute Liters of Oxygen 0 L     4 Minute Oxygen Saturation % 91 %     4 Minute Liters of Oxygen 0 L     5 Minute Oxygen Saturation % 92 %     5 Minute Liters of Oxygen 0 L     6 Minute Oxygen Saturation % 93 %      6 Minute Liters of Oxygen 0 L     2 Minute Post Oxygen Saturation % 98 %     2 Minute Post Liters of Oxygen 0 L              Oxygen Initial Assessment:  Oxygen Initial Assessment - 11/29/22 0832       Home Oxygen   Home Oxygen Device None    Sleep Oxygen Prescription CPAP    Home Exercise Oxygen Prescription None    Home Resting Oxygen Prescription None    Compliance with Home Oxygen Use No      Initial 6 min Walk  Oxygen Used None      Program Oxygen Prescription   Program Oxygen Prescription None      Intervention   Short Term Goals To learn and exhibit compliance with exercise, home and travel O2 prescription;To learn and understand importance of maintaining oxygen saturations>88%;To learn and demonstrate proper use of respiratory medications;To learn and understand importance of monitoring SPO2 with pulse oximeter and demonstrate accurate use of the pulse oximeter.;To learn and demonstrate proper pursed lip breathing techniques or other breathing techniques.     Long  Term Goals Exhibits compliance with exercise, home  and travel O2 prescription;Verbalizes importance of monitoring SPO2 with pulse oximeter and return demonstration;Maintenance of O2 saturations>88%;Exhibits proper breathing techniques, such as pursed lip breathing or other method taught during program session;Compliance with respiratory medication;Demonstrates proper use of MDI's             Oxygen Re-Evaluation:  Oxygen Re-Evaluation     Row Name 11/29/22 1509             Program Oxygen Prescription   Program Oxygen Prescription None         Home Oxygen   Home Oxygen Device None       Sleep Oxygen Prescription CPAP       Home Exercise Oxygen Prescription None       Home Resting Oxygen Prescription None       Compliance with Home Oxygen Use No         Goals/Expected Outcomes   Short Term Goals To learn and exhibit compliance with exercise, home and travel O2 prescription;To learn and  understand importance of maintaining oxygen saturations>88%;To learn and demonstrate proper use of respiratory medications;To learn and understand importance of monitoring SPO2 with pulse oximeter and demonstrate accurate use of the pulse oximeter.;To learn and demonstrate proper pursed lip breathing techniques or other breathing techniques.        Long  Term Goals Exhibits compliance with exercise, home  and travel O2 prescription;Verbalizes importance of monitoring SPO2 with pulse oximeter and return demonstration;Maintenance of O2 saturations>88%;Exhibits proper breathing techniques, such as pursed lip breathing or other method taught during program session;Compliance with respiratory medication;Demonstrates proper use of MDI's       Goals/Expected Outcomes Compliance and understanding of oxygen saturation monitoring and breathing techniques to decrease shortness of breath                Oxygen Discharge (Final Oxygen Re-Evaluation):  Oxygen Re-Evaluation - 11/29/22 1509       Program Oxygen Prescription   Program Oxygen Prescription None      Home Oxygen   Home Oxygen Device None    Sleep Oxygen Prescription CPAP    Home Exercise Oxygen Prescription None    Home Resting Oxygen Prescription None    Compliance with Home Oxygen Use No      Goals/Expected Outcomes   Short Term Goals To learn and exhibit compliance with exercise, home and travel O2 prescription;To learn and understand importance of maintaining oxygen saturations>88%;To learn and demonstrate proper use of respiratory medications;To learn and understand importance of monitoring SPO2 with pulse oximeter and demonstrate accurate use of the pulse oximeter.;To learn and demonstrate proper pursed lip breathing techniques or other breathing techniques.     Long  Term Goals Exhibits compliance with exercise, home  and travel O2 prescription;Verbalizes importance of monitoring SPO2 with pulse oximeter and return  demonstration;Maintenance of O2 saturations>88%;Exhibits proper breathing techniques, such as pursed lip breathing or other method taught during program  session;Compliance with respiratory medication;Demonstrates proper use of MDI's    Goals/Expected Outcomes Compliance and understanding of oxygen saturation monitoring and breathing techniques to decrease shortness of breath             Initial Exercise Prescription:  Initial Exercise Prescription - 11/29/22 0900       Date of Initial Exercise RX and Referring Provider   Date 11/29/22    Referring Provider Mannam    Expected Discharge Date 02/23/22      Recumbant Elliptical   Level 2    RPM 55    Watts 80    Minutes 15      Track   Minutes 15    METs 2      Prescription Details   Frequency (times per week) 2    Duration Progress to 30 minutes of continuous aerobic without signs/symptoms of physical distress      Intensity   THRR 40-80% of Max Heartrate 54-108    Ratings of Perceived Exertion 11-13    Perceived Dyspnea 0-4      Progression   Progression Continue to progress workloads to maintain intensity without signs/symptoms of physical distress.      Resistance Training   Training Prescription Yes    Weight blue bands             Perform Capillary Blood Glucose checks as needed.  Exercise Prescription Changes:   Exercise Prescription Changes     Row Name 12/07/22 1500             Response to Exercise   Blood Pressure (Admit) 104/58       Blood Pressure (Exercise) 120/60       Blood Pressure (Exit) 94/52       Heart Rate (Admit) 71 bpm       Heart Rate (Exercise) 112 bpm       Heart Rate (Exit) 81 bpm       Oxygen Saturation (Admit) 97 %       Oxygen Saturation (Exercise) 90 %       Oxygen Saturation (Exit) 95 %       Rating of Perceived Exertion (Exercise) 9       Perceived Dyspnea (Exercise) 1       Duration Continue with 30 min of aerobic exercise without signs/symptoms of physical  distress.       Intensity THRR unchanged         Progression   Progression Continue to progress workloads to maintain intensity without signs/symptoms of physical distress.         Resistance Training   Training Prescription Yes       Weight blue bands       Reps 10-15       Time 10 Minutes         Recumbant Elliptical   Level 2       RPM 60       Minutes 15       METs 3.2         Track   Laps 12       Minutes 15       METs 2.85                Exercise Comments:   Exercise Comments     Row Name 12/07/22 1508           Exercise Comments Pt completed first day of exercise. Charles Wright exercised for 15 min on recumbent elliptical  and track. Charles Wright averaged 3.2 METs at level 2 on the recumbent elliptical and 2.85 METs on the track. He performed the warmup and cooldown standing without limitations. Discussed METs. Pt voiced understanding.                Exercise Goals and Review:   Exercise Goals     Row Name 11/29/22 [phone]             Exercise Goals   Increase Physical Activity Yes       Intervention Provide advice, education, support and counseling about physical activity/exercise needs.;Develop an individualized exercise prescription for aerobic and resistive training based on initial evaluation findings, risk stratification, comorbidities and participant's personal goals.       Expected Outcomes Short Term: Attend rehab on a regular basis to increase amount of physical activity.;Long Term: Add in home exercise to make exercise part of routine and to increase amount of physical activity.;Long Term: Exercising regularly at least 3-5 days a week.       Increase Strength and Stamina Yes       Intervention Provide advice, education, support and counseling about physical activity/exercise needs.;Develop an individualized exercise prescription for aerobic and resistive training based on initial evaluation findings, risk stratification, comorbidities and participant's  personal goals.       Expected Outcomes Short Term: Increase workloads from initial exercise prescription for resistance, speed, and METs.;Short Term: Perform resistance training exercises routinely during rehab and add in resistance training at home;Long Term: Improve cardiorespiratory fitness, muscular endurance and strength as measured by increased METs and functional capacity ( )       Able to understand and use rate of perceived exertion (RPE) scale Yes       Intervention Provide education and explanation on how to use RPE scale       Expected Outcomes Short Term: Able to use RPE daily in rehab to express subjective intensity level;Long Term:  Able to use RPE to guide intensity level when exercising independently       Able to understand and use Dyspnea scale Yes       Intervention Provide education and explanation on how to use Dyspnea scale       Expected Outcomes Short Term: Able to use Dyspnea scale daily in rehab to express subjective sense of shortness of breath during exertion;Long Term: Able to use Dyspnea scale to guide intensity level when exercising independently       Knowledge and understanding of Target Heart Rate Range (THRR) Yes       Intervention Provide education and explanation of THRR including how the numbers were predicted and where they are located for reference       Expected Outcomes Short Term: Able to state/look up THRR;Long Term: Able to use THRR to govern intensity when exercising independently;Short Term: Able to use daily as guideline for intensity in rehab       Understanding of Exercise Prescription Yes       Intervention Provide education, explanation, and written materials on patient's individual exercise prescription       Expected Outcomes Short Term: Able to explain program exercise prescription;Long Term: Able to explain home exercise prescription to exercise independently                Exercise Goals Re-Evaluation :  Exercise Goals Re-Evaluation      Row Name 11/29/22 1508             Exercise Goal Re-Evaluation   Exercise  Goals Review Increase Physical Activity;Able to understand and use Dyspnea scale;Understanding of Exercise Prescription;Increase Strength and Stamina;Knowledge and understanding of Target Heart Rate Range (THRR);Able to understand and use rate of perceived exertion (RPE) scale       Comments Pt is scheduled to begin group exercise 10/29. Will monitor for progression.       Expected Outcomes Through exercise at rehab and at home, the patient will decrease shortness of breath with daily activities and feel confident in carrying out an exercise regime at home.                Discharge Exercise Prescription (Final Exercise Prescription Changes):  Exercise Prescription Changes - 12/07/22 1500       Response to Exercise   Blood Pressure (Admit) 104/58    Blood Pressure (Exercise) 120/60    Blood Pressure (Exit) 94/52    Heart Rate (Admit) 71 bpm    Heart Rate (Exercise) 112 bpm    Heart Rate (Exit) 81 bpm    Oxygen Saturation (Admit) 97 %    Oxygen Saturation (Exercise) 90 %    Oxygen Saturation (Exit) 95 %    Rating of Perceived Exertion (Exercise) 9    Perceived Dyspnea (Exercise) 1    Duration Continue with 30 min of aerobic exercise without signs/symptoms of physical distress.    Intensity THRR unchanged      Progression   Progression Continue to progress workloads to maintain intensity without signs/symptoms of physical distress.      Resistance Training   Training Prescription Yes    Weight blue bands    Reps 10-15    Time 10 Minutes      Recumbant Elliptical   Level 2    RPM 60    Minutes 15    METs 3.2      Track   Laps 12    Minutes 15    METs 2.85             Nutrition:  Target Goals: Understanding of nutrition guidelines, daily intake of sodium 1500mg , cholesterol 200mg , calories 30% from fat and 7% or less from saturated fats, daily to have 5 or more servings of fruits  and vegetables.  Biometrics:  Pre Biometrics - 11/29/22 0828       Pre Biometrics   Grip Strength 30 kg              Nutrition Therapy Plan and Nutrition Goals:  Nutrition Therapy & Goals - 12/07/22 1343       Nutrition Therapy   Diet Heart Healthy Diet    Drug/Food Interactions Statins/Certain Fruits      Personal Nutrition Goals   Nutrition Goal Patient to improve diet quality by using the plate method as a guide for meal planning to include lean protein/plant protein, fruits, vegetables, whole grains, nonfat dairy as part of a well-balanced diet.    Comments Charles Wright has medical history of NSTEMI, hyperlipidemia, sleep apnea, hx of TIA, ILD, afib ablation july 2024. His LDL is at goal. He reports no nutrition concerns/goals at this time. He reports eating a wide variety of foods, two meals daily, and stable appetite. His wife is a good support.  Patient will benefit from participation in pulmonary rehab for nutrition, exercise, and lifestyle modification.      Intervention Plan   Intervention Prescribe, educate and counsel regarding individualized specific dietary modifications aiming towards targeted core components such as weight, hypertension, lipid management, diabetes, heart failure and other comorbidities.;Nutrition  handout(s) given to patient.    Expected Outcomes Short Term Goal: Understand basic principles of dietary content, such as calories, fat, sodium, cholesterol and nutrients.;Long Term Goal: Adherence to prescribed nutrition plan.             Nutrition Assessments:  Nutrition Assessments - 12/07/22 1533       Rate Your Plate Scores   Pre Score 49            MEDIFICTS Score Key: >=70 Need to make dietary changes  40-70 Heart Healthy Diet <= 40 Therapeutic Level Cholesterol Diet  Flowsheet Row PULMONARY REHAB OTHER RESPIRATORY from 12/07/2022 in [facility]  Picture Your Plate Total Score  on Admission 49      Picture Your Plate Scores: <16 Unhealthy dietary pattern with much room for improvement. 41-50 Dietary pattern unlikely to meet recommendations for good health and room for improvement. 51-60 More healthful dietary pattern, with some room for improvement.  >60 Healthy dietary pattern, although there may be some specific behaviors that could be improved.    Nutrition Goals Re-Evaluation:  Nutrition Goals Re-Evaluation     Row Name 12/07/22 1343             Goals   Current Weight 191 lb 5.8 oz (86.8 kg)       Comment LDL 55, HDL 38, GFR 44, HDL 38       Expected Outcome Charles Wright has medical history of NSTEMI, hyperlipidemia, sleep apnea, hx of TIA, ILD, afib ablation july 2024. His LDL is at goal. He reports no nutrition concerns/goals at this time. He reports eating a wide variety of foods, two meals daily, and stable appetite. His wife is a good support. Patient will benefit from participation in pulmonary rehab for nutrition, exercise, and lifestyle modification.                Nutrition Goals Discharge (Final Nutrition Goals Re-Evaluation):  Nutrition Goals Re-Evaluation - 12/07/22 1343       Goals   Current Weight 191 lb 5.8 oz (86.8 kg)    Comment LDL 55, HDL 38, GFR 44, HDL 38    Expected Outcome Charles Wright has medical history of NSTEMI, hyperlipidemia, sleep apnea, hx of TIA, ILD, afib ablation july 2024. His LDL is at goal. He reports no nutrition concerns/goals at this time. He reports eating a wide variety of foods, two meals daily, and stable appetite. His wife is a good support. Patient will benefit from participation in pulmonary rehab for nutrition, exercise, and lifestyle modification.             Psychosocial: Target Goals: Acknowledge presence or absence of significant depression and/or stress, maximize coping skills, provide positive support system. Participant is able to verbalize types and ability to use techniques and skills needed for  reducing stress and depression.  Initial Review & Psychosocial Screening:  Initial Psych Review & Screening - 11/29/22 0834       Initial Review   Current issues with None Identified      Family Dynamics   Good Support System? Yes    Comments spouse      Barriers   Psychosocial barriers to participate in program There are no identifiable barriers or psychosocial needs.      Screening Interventions   Interventions Encouraged to exercise             Quality of Life Scores:  Scores of 19 and below usually  indicate a poorer quality of life in these areas.  A difference of  2-3 points is a clinically meaningful difference.  A difference of 2-3 points in the total score of the Quality of Life Index has been associated with significant improvement in overall quality of life, self-image, physical symptoms, and general health in studies assessing change in quality of life.  PHQ-9: Review Flowsheet  More data exists      11/29/2022 06/01/2017 05/04/2017 10/07/2016 12/24/2014  Depression screen PHQ 2/9  Decreased Interest 0 0 0 0 0  Down, Depressed, Hopeless 0 0 0 0 0  PHQ - 2 Score 0 0 0 0 0  Altered sleeping 0 - - - -  Tired, decreased energy 0 - - - -  Change in appetite 0 - - - -  Feeling bad or failure about yourself  0 - - - -  Trouble concentrating 0 - - - -  Moving slowly or fidgety/restless 0 - - - -  Suicidal thoughts 0 - - - -  PHQ-9 Score 0 - - - -  Difficult doing work/chores Not difficult at all - - - -    Details           Interpretation of Total Score  Total Score Depression Severity:  1-4 = Minimal depression, 5-9 = Mild depression, 10-14 = Moderate depression, 15-19 = Moderately severe depression, 20-27 = Severe depression   Psychosocial Evaluation and Intervention:  Psychosocial Evaluation - 11/29/22 0834       Psychosocial Evaluation & Interventions   Interventions Encouraged to exercise with the program and follow exercise prescription     Comments Pt denies any psychosocial needs at this time.    Expected Outcomes For Charles Wright to participate in rehab free of psychosocial barriers.    Continue Psychosocial Services  No Follow up required             Psychosocial Re-Evaluation:  Psychosocial Re-Evaluation     Row Name 12/03/22 1018             Psychosocial Re-Evaluation   Current issues with None Identified       Comments No changes in prior assessment. Charles Wright is scheduled to start the program on 12/07/22.       Expected Outcomes For Charles Wright to participate in rehab free of psychosocial barriers.       Interventions Encouraged to attend Pulmonary Rehabilitation for the exercise       Continue Psychosocial Services  No Follow up required                Psychosocial Discharge (Final Psychosocial Re-Evaluation):  Psychosocial Re-Evaluation - 12/03/22 1018       Psychosocial Re-Evaluation   Current issues with None Identified    Comments No changes in prior assessment. Charles Wright is scheduled to start the program on 12/07/22.    Expected Outcomes For Charles Wright to participate in rehab free of psychosocial barriers.    Interventions Encouraged to attend Pulmonary Rehabilitation for the exercise    Continue Psychosocial Services  No Follow up required             Education: Education Goals: Education classes will be provided on a weekly basis, covering required topics. Participant will state understanding/return demonstration of topics presented.  Learning Barriers/Preferences:  Learning Barriers/Preferences - 11/29/22 0835       Learning Barriers/Preferences   Learning Barriers Hearing    Learning Preferences Skilled Demonstration  Education Topics: Know Your Numbers Group instruction that is supported by a PowerPoint presentation. Instructor discusses importance of knowing and understanding resting, exercise, and post-exercise oxygen saturation, heart rate, and blood pressure. Oxygen saturation,  heart rate, blood pressure, rating of perceived exertion, and dyspnea are reviewed along with a normal range for these values.    Exercise for the Pulmonary Patient Group instruction that is supported by a PowerPoint presentation. Instructor discusses benefits of exercise, core components of exercise, frequency, duration, and intensity of an exercise routine, importance of utilizing pulse oximetry during exercise, safety while exercising, and options of places to exercise outside of rehab.    MET Level  Group instruction provided by PowerPoint, verbal discussion, and written material to support subject matter. Instructor reviews what METs are and how to increase METs.    Pulmonary Medications Verbally interactive group education provided by instructor with focus on inhaled medications and proper administration.   Anatomy and Physiology of the Respiratory System Group instruction provided by PowerPoint, verbal discussion, and written material to support subject matter. Instructor reviews respiratory cycle and anatomical components of the respiratory system and their functions. Instructor also reviews differences in obstructive and restrictive respiratory diseases with examples of each.    Oxygen Safety Group instruction provided by PowerPoint, verbal discussion, and written material to support subject matter. There is an overview of "What is Oxygen" and "Why do we need it".  Instructor also reviews how to create a safe environment for oxygen use, the importance of using oxygen as prescribed, and the risks of noncompliance. There is a brief discussion on traveling with oxygen and resources the patient may utilize.   Oxygen Use Group instruction provided by PowerPoint, verbal discussion, and written material to discuss how supplemental oxygen is prescribed and different types of oxygen supply systems. Resources for more information are provided.    Breathing Techniques Group instruction that  is supported by demonstration and informational handouts. Instructor discusses the benefits of pursed lip and diaphragmatic breathing and detailed demonstration on how to perform both.     Risk Factor Reduction Group instruction that is supported by a PowerPoint presentation. Instructor discusses the definition of a risk factor, different risk factors for pulmonary disease, and how the heart and lungs work together.   Pulmonary Diseases Group instruction provided by PowerPoint, verbal discussion, and written material to support subject matter. Instructor gives an overview of the different type of pulmonary diseases. There is also a discussion on risk factors and symptoms as well as ways to manage the diseases.   Stress and Energy Conservation Group instruction provided by PowerPoint, verbal discussion, and written material to support subject matter. Instructor gives an overview of stress and the impact it can have on the body. Instructor also reviews ways to reduce stress. There is also a discussion on energy conservation and ways to conserve energy throughout the day.   Warning Signs and Symptoms Group instruction provided by PowerPoint, verbal discussion, and written material to support subject matter. Instructor reviews warning signs and symptoms of stroke, heart attack, cold and flu. Instructor also reviews ways to prevent the spread of infection.   Other Education Group or individual verbal, written, or video instructions that support the educational goals of the pulmonary rehab program.    Knowledge Questionnaire Score:  Knowledge Questionnaire Score - 11/29/22 0836       Knowledge Questionnaire Score   Pre Score 16/18             Core Components/Risk Factors/Patient  Goals at Admission:  Personal Goals and Risk Factors at Admission - 11/29/22 0835       Core Components/Risk Factors/Patient Goals on Admission    Weight Management Weight Loss;Yes    Intervention Weight  Management: Develop a combined nutrition and exercise program designed to reach desired caloric intake, while maintaining appropriate intake of nutrient and fiber, sodium and fats, and appropriate energy expenditure required for the weight goal.;Weight Management: Provide education and appropriate resources to help participant work on and attain dietary goals.;Weight Management/Obesity: Establish reasonable short term and long term weight goals.;Obesity: Provide education and appropriate resources to help participant work on and attain dietary goals.    Expected Outcomes Short Term: Continue to assess and modify interventions until short term weight is achieved;Long Term: Adherence to nutrition and physical activity/exercise program aimed toward attainment of established weight goal;Weight Maintenance: Understanding of the daily nutrition guidelines, which includes 25-35% calories from fat, 7% or less cal from saturated fats, less than 200mg  cholesterol, less than 1.5gm of sodium, & 5 or more servings of fruits and vegetables daily;Weight Loss: Understanding of general recommendations for a balanced deficit meal plan, which promotes 1-2 lb weight loss per week and includes a negative energy balance of [phone] kcal/d;Understanding of distribution of calorie intake throughout the day with the consumption of 4-5 meals/snacks;Understanding recommendations for meals to include 15-35% energy as protein, 25-35% energy from fat, 35-60% energy from carbohydrates, less than 200mg  of dietary cholesterol, 20-35 gm of total fiber daily    Improve shortness of breath with ADL's Yes    Intervention Provide education, individualized exercise plan and daily activity instruction to help decrease symptoms of SOB with activities of daily living.    Expected Outcomes Short Term: Improve cardiorespiratory fitness to achieve a reduction of symptoms when performing ADLs;Long Term: Be able to perform more ADLs without symptoms or delay  the onset of symptoms             Core Components/Risk Factors/Patient Goals Review:   Goals and Risk Factor Review     Row Name 12/03/22 1019             Core Components/Risk Factors/Patient Goals Review   Personal Goals Review Weight Management/Obesity;Improve shortness of breath with ADL's;Develop more efficient breathing techniques such as purse lipped breathing and diaphragmatic breathing and practicing self-pacing with activity.       Review No changes from orientation. Harumi is scheduled to start the program on 12/07/22.       Expected Outcomes For Charles Wright to lose weight, improve his SOB with ADLs, and develop more efficient breathing techniques such as purse lipped breathing and diaphragmatic breathing; and practicing self-pacing with activity                Core Components/Risk Factors/Patient Goals at Discharge (Final Review):   Goals and Risk Factor Review - 12/03/22 1019       Core Components/Risk Factors/Patient Goals Review   Personal Goals Review Weight Management/Obesity;Improve shortness of breath with ADL's;Develop more efficient breathing techniques such as purse lipped breathing and diaphragmatic breathing and practicing self-pacing with activity.    Review No changes from orientation. Charles Wright is scheduled to start the program on 12/07/22.    Expected Outcomes For Charles Wright to lose weight, improve his SOB with ADLs, and develop more efficient breathing techniques such as purse lipped breathing and diaphragmatic breathing; and practicing self-pacing with activity             ITP Comments:  Comments: Pt is making expected progress toward Pulmonary Rehab goals after completing 1 session(s). Recommend continued exercise, life style modification, education, and utilization of breathing techniques to increase stamina and strength, while also decreasing shortness of breath with exertion.  Dr. Mechele Collin is Medical Director for Pulmonary Rehab at [facility].

## 2022-12-09 ENCOUNTER — Encounter (HOSPITAL_COMMUNITY)
Admission: RE | Admit: 2022-12-09 | Discharge: 2022-12-09 | Disposition: A | Discharge status: Home / Self Care | Payer: Medicare Other | Source: Ambulatory Visit | Attending: Pulmonary Disease | Admitting: Pulmonary Disease

## 2022-12-09 DIAGNOSIS — J849 Interstitial pulmonary disease, unspecified: Secondary | ICD-10-CM

## 2022-12-09 NOTE — Progress Notes (Signed)
Daily Session Note  Patient Details  Name: Charles Wright MRN: [id number] Date of Birth: 1935-03-25 Referring Provider:   Doristine Devoid Pulmonary Rehab Walk Test from 11/29/2022 in [facility] for Heart, Vascular, & Lung Health  Referring Provider Mannam       Encounter Date: 12/09/2022  Check In:  Session Check In - 12/09/22 1426       Check-In   Supervising physician immediately available to respond to emergencies [facility] MD immediately available    Physician(s) Carlos Levering, NP    Location [facility]    Staff Present Essie Hart, RN, Doris Cheadle, MS, ACSM-CEP, Exercise Physiologist;Samantha Belarus, RD, LDN;Amiayah Giebel Chester Holstein, MS, Exercise Physiologist    Virtual Visit No    Medication changes reported     No    Fall or balance concerns reported    No    Tobacco Cessation No Change    Warm-up and Cool-down Performed as group-led instruction    Resistance Training Performed Yes    VAD Patient? No    PAD/SET Patient? No      Pain Assessment   Currently in Pain? No/denies    Multiple Pain Sites No             Capillary Blood Glucose: No results found for this or any previous visit (from the past 24 hour(s)).    Social History   Tobacco Use  Smoking Status Never  Smokeless Tobacco Never    Goals Met:  Proper associated with RPD/PD & O2 Sat Independence with exercise equipment Exercise tolerated well No report of concerns or symptoms today Strength training completed today  Goals Unmet:  Not Applicable  Comments: Service time is from 1310 to 1435.    Dr. Mechele Collin is Medical Director for Pulmonary Rehab at [facility].

## 2022-12-14 ENCOUNTER — Encounter (HOSPITAL_COMMUNITY)
Admission: RE | Admit: 2022-12-14 | Discharge: 2022-12-14 | Disposition: A | Discharge status: Home / Self Care | Payer: Medicare Other | Source: Ambulatory Visit | Attending: Pulmonary Disease | Admitting: Pulmonary Disease

## 2022-12-14 DIAGNOSIS — J849 Interstitial pulmonary disease, unspecified: Secondary | ICD-10-CM | POA: Diagnosis not present

## 2022-12-14 NOTE — Progress Notes (Signed)
Daily Session Note  Patient Details  Name: Charles Wright MRN: [id number] Date of Birth: 08/07/1935 Referring Provider:   Doristine Devoid Pulmonary Rehab Walk Test from 11/29/2022 in [facility] for Heart, Vascular, & Lung Health  Referring Provider Mannam       Encounter Date: 12/14/2022  Check In:  Session Check In - 12/14/22 1413       Check-In   Supervising physician immediately available to respond to emergencies [facility] MD immediately available    Physician(s) Carlos Levering, NP    [facility]    Staff Present Essie Hart, RN, Doris Cheadle, MS, ACSM-CEP, Exercise Physiologist;Samantha Belarus, RD, LDN;Mikylah Ackroyd Katrinka Blazing, RT;Randi Reeve BS, ACSM-CEP, Exercise Physiologist    Virtual Visit No    Medication changes reported     No    Fall or balance concerns reported    No    Tobacco Cessation No Change    Warm-up and Cool-down Performed as group-led instruction    Resistance Training Performed Yes    VAD Patient? No    PAD/SET Patient? No      Pain Assessment   Currently in Pain? No/denies    Multiple Pain Sites No             Capillary Blood Glucose: No results found for this or any previous visit (from the past 24 hour(s)).    Social History   Tobacco Use  Smoking Status Never  Smokeless Tobacco Never    Goals Met:  Proper associated with RPD/PD & O2 Sat Independence with exercise equipment Exercise tolerated well No report of concerns or symptoms today Strength training completed today  Goals Unmet:  Not Applicable  Comments: Service time is from 1305 to 1441.    Dr. Mechele Collin is Medical Director for Pulmonary Rehab at [facility].

## 2022-12-16 ENCOUNTER — Encounter (HOSPITAL_COMMUNITY)
Admission: RE | Admit: 2022-12-16 | Discharge: 2022-12-16 | Disposition: A | Discharge status: Home / Self Care | Payer: Medicare Other | Source: Ambulatory Visit | Attending: Pulmonary Disease | Admitting: Pulmonary Disease

## 2022-12-16 DIAGNOSIS — J849 Interstitial pulmonary disease, unspecified: Secondary | ICD-10-CM | POA: Diagnosis not present

## 2022-12-16 NOTE — Progress Notes (Signed)
Daily Session Note  Patient Details  Name: Charles Wright MRN: [id number] Date of Birth: 07-Feb-1936 Referring Provider:   Doristine Devoid Pulmonary Rehab Walk Test from 11/29/2022 in [facility] for Heart, Vascular, & Lung Health  Referring Provider Mannam       Encounter Date: 12/16/2022  Check In:  Session Check In - 12/16/22 1349       Check-In   Supervising physician immediately available to respond to emergencies [facility] MD immediately available    Physician(s) Robin Searing, NP    Location [facility]    Staff Present Essie Hart, RN, Doris Cheadle, MS, ACSM-CEP, Exercise Physiologist;Kashmir Lysaght Erin Sons BS, ACSM-CEP, Exercise Physiologist    Virtual Visit No    Medication changes reported     No    Fall or balance concerns reported    No    Tobacco Cessation No Change    Warm-up and Cool-down Performed as group-led instruction    Resistance Training Performed Yes    VAD Patient? No    PAD/SET Patient? No      Pain Assessment   Currently in Pain? No/denies    Multiple Pain Sites No             Capillary Blood Glucose: No results found for this or any previous visit (from the past 24 hour(s)).    Social History   Tobacco Use  Smoking Status Never  Smokeless Tobacco Never    Goals Met:  Proper associated with RPD/PD & O2 Sat Independence with exercise equipment Exercise tolerated well No report of concerns or symptoms today Strength training completed today  Goals Unmet:  Not Applicable  Comments: Service time is from 1304 to 1437.    Dr. Mechele Collin is Medical Director for Pulmonary Rehab at [facility].

## 2022-12-21 ENCOUNTER — Encounter (HOSPITAL_COMMUNITY)
Admission: RE | Admit: 2022-12-21 | Discharge: 2022-12-21 | Disposition: A | Discharge status: Home / Self Care | Payer: Medicare Other | Source: Ambulatory Visit | Attending: Pulmonary Disease | Admitting: Pulmonary Disease

## 2022-12-21 ENCOUNTER — Encounter (HOSPITAL_COMMUNITY): Payer: Self-pay

## 2022-12-21 VITALS — Wt 193.6 lb

## 2022-12-21 DIAGNOSIS — J849 Interstitial pulmonary disease, unspecified: Secondary | ICD-10-CM | POA: Diagnosis not present

## 2022-12-21 NOTE — Progress Notes (Signed)
Daily Session Note  Patient Details  Name: Charles Wright MRN: [id number] Date of Birth: October 07, 1935 Referring Provider:   Doristine Devoid Pulmonary Rehab Walk Test from 11/29/2022 in [facility] for Heart, Vascular, & Lung Health  Referring Provider Mannam       Encounter Date: 12/21/2022  Check In:  Session Check In - 12/21/22 1333       Check-In   Supervising physician immediately available to respond to emergencies [facility] MD immediately available    Physician(s) Lorin Picket, NP    [facility]    Staff Present Essie Hart, RN, Doris Cheadle, MS, ACSM-CEP, Exercise Physiologist;Casey Erin Sons BS, ACSM-CEP, Exercise Physiologist;Carlette Les Pou, RN, BSN    Virtual Visit No    Medication changes reported     No    Fall or balance concerns reported    No    Tobacco Cessation No Change    Warm-up and Cool-down Performed as group-led instruction    Resistance Training Performed Yes    VAD Patient? No    PAD/SET Patient? No      Pain Assessment   Currently in Pain? No/denies    Multiple Pain Sites No             Capillary Blood Glucose: No results found for this or any previous visit (from the past 24 hour(s)).   Exercise Prescription Changes - 12/21/22 1500       Response to Exercise   Blood Pressure (Admit) 120/62    Blood Pressure (Exercise) 146/72    Blood Pressure (Exit) 102/60    Heart Rate (Admit) 77 bpm    Heart Rate (Exercise) 125 bpm    Heart Rate (Exit) 90 bpm    Oxygen Saturation (Admit) 94 %    Oxygen Saturation (Exercise) 90 %    Oxygen Saturation (Exit) 95 %    Rating of Perceived Exertion (Exercise) 11    Perceived Dyspnea (Exercise) 1    Duration Continue with 30 min of aerobic exercise without signs/symptoms of physical distress.    Intensity THRR unchanged      Progression   Progression Continue to progress workloads to maintain intensity without signs/symptoms of physical  distress.      Resistance Training   Training Prescription Yes    Weight blue bands    Reps 10-15    Time 10 Minutes      Treadmill   MPH 2.6    Grade 0    Minutes 15    METs 2.99      Recumbant Elliptical   Level 6    Minutes 15    METs 4.8             Social History   Tobacco Use  Smoking Status Never  Smokeless Tobacco Never    Goals Met:  Independence with exercise equipment Exercise tolerated well No report of concerns or symptoms today Strength training completed today  Goals Unmet:  Not Applicable  Comments: Service time is from 1303 to 1433    Dr. Mechele Collin is Medical Director for Pulmonary Rehab at [facility].

## 2022-12-23 ENCOUNTER — Encounter (HOSPITAL_COMMUNITY)
Admission: RE | Admit: 2022-12-23 | Discharge: 2022-12-23 | Disposition: A | Discharge status: Home / Self Care | Payer: Medicare Other | Source: Ambulatory Visit | Attending: Pulmonary Disease | Admitting: Pulmonary Disease

## 2022-12-23 DIAGNOSIS — J849 Interstitial pulmonary disease, unspecified: Secondary | ICD-10-CM

## 2022-12-23 NOTE — Progress Notes (Signed)
Daily Session Note  Patient Details  Name: Charles Wright MRN: [id number] Date of Birth: [birth year] Referring Provider:   Doristine Devoid Pulmonary Rehab Walk Test from 11/29/2022 in [facility] for Heart, Vascular, & Lung Health  Referring Provider Mannam       Encounter Date: 12/23/2022  Check In:  Session Check In - 12/23/22 1347       Check-In   Supervising physician immediately available to respond to emergencies [facility] MD immediately available    Physician(s) Bernadene Person, NP    [facility]    Staff Present Raford Pitcher, MS, ACSM-CEP, Exercise Physiologist;Casey Erin Sons BS, ACSM-CEP, Exercise Physiologist;Samantha Belarus, RD, LDN    Virtual Visit No    Medication changes reported     No    Fall or balance concerns reported    No    Tobacco Cessation No Change    Warm-up and Cool-down Performed as group-led instruction    Resistance Training Performed Yes    VAD Patient? No    PAD/SET Patient? No      Pain Assessment   Currently in Pain? No/denies    Multiple Pain Sites No             Capillary Blood Glucose: No results found for this or any previous visit (from the past 24 hour(s)).    Social History   Tobacco Use  Smoking Status Never  Smokeless Tobacco Never    Goals Met:  Proper associated with RPD/PD & O2 Sat Independence with exercise equipment Exercise tolerated well No report of concerns or symptoms today Strength training completed today  Goals Unmet:  Not Applicable  Comments: Service time is from 1306 to 1440.    Dr. Mechele Collin is Medical Director for Pulmonary Rehab at [facility].

## 2022-12-27 DIAGNOSIS — Z8679 Personal history of other diseases of the circulatory system: Secondary | ICD-10-CM | POA: Diagnosis not present

## 2022-12-27 DIAGNOSIS — I25119 Atherosclerotic heart disease of native coronary artery with unspecified angina pectoris: Secondary | ICD-10-CM | POA: Diagnosis not present

## 2022-12-28 ENCOUNTER — Encounter (HOSPITAL_COMMUNITY)
Admission: RE | Admit: 2022-12-28 | Discharge: 2022-12-28 | Disposition: A | Discharge status: Home / Self Care | Payer: Medicare Other | Source: Ambulatory Visit | Attending: Pulmonary Disease

## 2022-12-28 DIAGNOSIS — J849 Interstitial pulmonary disease, unspecified: Secondary | ICD-10-CM | POA: Diagnosis not present

## 2022-12-28 NOTE — Progress Notes (Signed)
Daily Session Note  Patient Details  Name: Charles Wright MRN: [id number] Date of Birth: 11/30/1935 Referring Provider:   Doristine Devoid Pulmonary Rehab Walk Test from 11/29/2022 in [facility] for Heart, Vascular, & Lung Health  Referring Provider Mannam       Encounter Date: 12/28/2022  Check In:  Session Check In - 12/28/22 1327       Check-In   Supervising physician immediately available to respond to emergencies [facility] MD immediately available    Physician(s) Bernadene Person, NP    [facility]    Staff Present Raford Pitcher, MS, ACSM-CEP, Exercise Physiologist;Yena Tisby Erin Sons BS, ACSM-CEP, Exercise Physiologist;Samantha Belarus, RD, Dutch Gray, RN, BSN    Virtual Visit No    Medication changes reported     No    Fall or balance concerns reported    No    Tobacco Cessation No Change    Warm-up and Cool-down Performed as group-led Writer Performed Yes    VAD Patient? No    PAD/SET Patient? No      Pain Assessment   Currently in Pain? No/denies    Multiple Pain Sites No             Capillary Blood Glucose: No results found for this or any previous visit (from the past 24 hour(s)).    Social History   Tobacco Use  Smoking Status Never  Smokeless Tobacco Never    Goals Met:  Proper associated with RPD/PD & O2 Sat Independence with exercise equipment Exercise tolerated well No report of concerns or symptoms today Strength training completed today  Goals Unmet:  Not Applicable  Comments: Service time is from 1310 to 1435.    Dr. Mechele Collin is Medical Director for Pulmonary Rehab at [facility].

## 2022-12-30 ENCOUNTER — Telehealth (HOSPITAL_COMMUNITY): Payer: Self-pay

## 2022-12-30 ENCOUNTER — Encounter (HOSPITAL_COMMUNITY): Payer: Medicare Other

## 2022-12-30 NOTE — Telephone Encounter (Signed)
Pt called and was not able to sleep last night due to a cough. He will not make it to class today due to the cough being much worse than he thought!

## 2022-12-31 DIAGNOSIS — J018 Other acute sinusitis: Secondary | ICD-10-CM | POA: Diagnosis not present

## 2022-12-31 DIAGNOSIS — R051 Acute cough: Secondary | ICD-10-CM | POA: Diagnosis not present

## 2022-12-31 DIAGNOSIS — R519 Headache, unspecified: Secondary | ICD-10-CM | POA: Diagnosis not present

## 2022-12-31 DIAGNOSIS — R0989 Other specified symptoms and signs involving the circulatory and respiratory systems: Secondary | ICD-10-CM | POA: Diagnosis not present

## 2022-12-31 DIAGNOSIS — R059 Cough, unspecified: Secondary | ICD-10-CM | POA: Diagnosis not present

## 2022-12-31 DIAGNOSIS — J8410 Pulmonary fibrosis, unspecified: Secondary | ICD-10-CM | POA: Diagnosis not present

## 2023-01-04 ENCOUNTER — Telehealth (HOSPITAL_COMMUNITY): Payer: Self-pay

## 2023-01-04 ENCOUNTER — Encounter (HOSPITAL_COMMUNITY): Payer: Self-pay

## 2023-01-04 ENCOUNTER — Encounter (HOSPITAL_COMMUNITY)
Admission: RE | Admit: 2023-01-04 | Discharge: 2023-01-04 | Disposition: A | Discharge status: Home / Self Care | Payer: Medicare Other | Source: Ambulatory Visit | Attending: Pulmonary Disease | Admitting: Pulmonary Disease

## 2023-01-04 VITALS — Wt 190.5 lb

## 2023-01-04 DIAGNOSIS — J849 Interstitial pulmonary disease, unspecified: Secondary | ICD-10-CM

## 2023-01-04 NOTE — Progress Notes (Signed)
Daily Session Note  Patient Details  Name: Charles Wright MRN: [id number] Date of Birth: 1935/07/03 Referring Provider:   Doristine Devoid Pulmonary Rehab Walk Test from 11/29/2022 in [facility]  Referring Provider Mannam       Encounter Date: 01/04/2023  Check In:  Session Check In - 01/04/23 1320       Check-In   Supervising physician immediately available to respond to emergencies [facility] MD immediately available    Physician(s) Jari Favre, PA    Location [facility]    Staff Present Raford Pitcher, MS, ACSM-CEP, Exercise Physiologist;Edgerrin Correia Erin Sons BS, ACSM-CEP, Exercise Physiologist;Mary Gerre Scull, RN, BSN    Virtual Visit No    Medication changes reported     No    Fall or balance concerns reported    No    Tobacco Cessation No Change    Warm-up and Cool-down Performed as group-led instruction    Resistance Training Performed Yes    VAD Patient? No    PAD/SET Patient? No      Pain Assessment   Currently in Pain? No/denies    Multiple Pain Sites No             Capillary Blood Glucose: No results found for this or any previous visit (from the past 24 hour(s)).   Exercise Prescription Changes - 01/04/23 1300       Response to Exercise   Blood Pressure (Admit) 106/60    Blood Pressure (Exercise) 166/80    Blood Pressure (Exit) 114/60    Heart Rate (Admit) 85 bpm    Heart Rate (Exercise) 137 bpm   Had pt decrease speed d/t exceeding HR max   Heart Rate (Exit) 93 bpm    Oxygen Saturation (Admit) 95 %    Oxygen Saturation (Exercise) 88 %    Oxygen Saturation (Exit) 93 %    Rating of Perceived Exertion (Exercise) 13    Perceived Dyspnea (Exercise) 3    Duration Continue with 30 min of aerobic exercise without signs/symptoms of physical distress.    Intensity THRR unchanged      Progression   Progression Continue to progress workloads to maintain intensity without signs/symptoms  of physical distress.      Resistance Training   Training Prescription Yes    Weight black bands    Reps 10-15    Time 10 Minutes      Treadmill   MPH 2.7    Grade 0    Minutes 15    METs 2.9      Recumbant Elliptical   Level 7    RPM 61    Watts 105    Minutes 15    METs 4.5             Social History   Tobacco Use  Smoking Status Never  Smokeless Tobacco Never    Goals Met:  Proper associated with RPD/PD & O2 Sat Independence with exercise equipment Exercise tolerated well No report of concerns or symptoms today Strength training completed today  Goals Unmet:  Not Applicable  Comments: Service time is from 1310 to 1443.    Dr. Mechele Collin is Medical Director for Pulmonary Rehab at [facility].

## 2023-01-04 NOTE — Telephone Encounter (Signed)
Dr. Isaiah Serge, Charles Wright is needing an increase in his target heart rate to 145 while exercising in pulmonary rehab. Is this ok with you?

## 2023-01-04 NOTE — Telephone Encounter (Signed)
Charles Wright called and expressed he might have to miss today's rehab appointment. He explained " I was out all last week due to being sick. I went to the doctor and they gave me a steroid as well as doxycycline. I hate to miss today but I'm not sure I can come as I still have lingering cough.I have finished ,my doxycycline I just hate missing my rehab classes" After speaking with Carlette I explained to him it is okay to come back today, we do suggest wearing a mask just out of precaution for patients and staff, but as long as he is fever free and has no other symptoms we are fine with him returning! He verbalized he understood and that he will return today!

## 2023-01-05 NOTE — Progress Notes (Signed)
Pulmonary Individual Treatment Plan  Patient Details  Name: Charles Wright MRN: [id number] Date of Birth: 1936-01-16 Referring Provider:   Doristine Devoid Pulmonary Rehab Walk Test from 11/29/2022 in [facility]  Referring Provider Mannam       Initial Encounter Date:  Flowsheet Row Pulmonary Rehab Walk Test from 11/29/2022 in [facility]  Date 11/29/22       Visit Diagnosis: Interstitial lung disease (HCC)  Patient's Home Medications on Admission:   Current Outpatient Medications:    acetaminophen (TYLENOL) 500 MG tablet, Take 1,000 mg by mouth every 6 (six) hours as needed for moderate pain or headache., Disp: , Rfl:    allopurinol (ZYLOPRIM) 300 MG tablet, Take 300 mg by mouth every morning. , Disp: , Rfl:    apixaban (ELIQUIS) 5 MG TABS tablet, Take 1 tablet (5 mg total) by mouth 2 (two) times daily., Disp: 60 tablet, Rfl: 11   aspirin EC 81 MG tablet, Take 81 mg by mouth every evening., Disp: , Rfl:    Cholecalciferol (VITAMIN D-3) 125 MCG (5000 UT) TABS, Take 5,000 Units by mouth daily., Disp: , Rfl:    doxycycline (MONODOX) 50 MG capsule, Take 50 mg by mouth daily., Disp: , Rfl:    EPINEPHrine 0.3 mg/0.3 mL IJ SOAJ injection, Inject 0.3 mLs (0.3 mg total) into the muscle once. (Patient taking differently: Inject 0.3 mg into the muscle as needed for anaphylaxis.), Disp: 2 Device, Rfl: 1   GLUCOSAMINE SULFATE PO, Take 750 mg by mouth daily., Disp: , Rfl:    ipratropium (ATROVENT) 0.03 % nasal spray, Place 1 spray into both nostrils 2 (two) times daily., Disp: , Rfl:    levothyroxine (SYNTHROID, LEVOTHROID) 50 MCG tablet, Take 50 mcg by mouth daily before breakfast., Disp: , Rfl:    metoprolol tartrate (LOPRESSOR) 25 MG tablet, Take 1 tablet (25 mg total) by mouth 2 (two) times daily., Disp: 180 tablet, Rfl: 2   nitroGLYCERIN (NITROSTAT) 0.4 MG SL tablet, Place 1  tablet (0.4 mg total) under the tongue every 5 (five) minutes x 3 doses as needed for chest pain., Disp: 25 tablet, Rfl: 12   rosuvastatin (CRESTOR) 20 MG tablet, TAKE ONE TABLET BY MOUTH DAILY, Disp: 90 tablet, Rfl: 3   Testosterone 1.62 % GEL, Apply 2 Pump topically every other day. Applied to shoulder area, Disp: , Rfl:   Past Medical History: Past Medical History:  Diagnosis Date   Acute gout of left ankle    06-14-2015   Bladder cancer (HCC)    BCG's tx's   BPH (benign prostatic hypertrophy)    CAD (coronary artery disease) CARDIOLOGIST-  DR TILLEY   a. NSTEMI 12/2014 -  99% dLAD-2 (not amenable to PCI), 50% dLAD-1, 60% mLAD. Medical therapy was recommended.   Cancer of kidney (HCC)    left   Cough    HARSH NONPRODUCTIVE COUGH 1 AND 1/2 WEEKS   GERD (gastroesophageal reflux disease)    takes  OTC periodically   Gilbert's syndrome    H/O cardiac catheterization    a. Note: Difficult radial access in 12/2014 - recommend femoral approach if cath needed in the future.   History of kidney stones    History of non-ST elevation myocardial infarction (NSTEMI)    12-12-2014   CARDIAC CATH W/ NO INTERVENTION X 2FEW DAYS APART   History of spinal fracture 2002   LUMBAR  Hyperlipidemia    Hypertension    Hypothyroidism    Myocardial infarction (HCC)    OSA on CPAP    SET ON 7 USES SOME NIGHTS   Paroxysmal A-fib [facility])    cardiologist-  dr Donnie Aho   Prostate cancer [facility]) 2019   last PSA  2.9  (montiored by urologist  dr Mena Goes) Vanessa Kick AND FEB 2019 RAD DONE   Shingles    2 weeks ago   Wears glasses     Tobacco Use: Social History   Tobacco Use  Smoking Status Never  Smokeless Tobacco Never    Labs: Review Flowsheet  More data exists      Latest Ref Rng & Units 12/12/2014 10/10/2018 04/08/2019 05/14/2021 09/01/2021  Labs for ITP Cardiac and Pulmonary Rehab  Cholestrol 100 - 199 mg/dL 098  119  - - 147   LDL (calc) 0 - 99 mg/dL 829  562  - - 55   Direct LDL 0 - 99 mg/dL - - -  130  -  HDL-C >86 mg/dL 45  42  - - 38   Trlycerides 0 - 149 mg/dL 88  578  - - 90   Bicarbonate 20.0 - 28.0 mmol/L - - 24.6  - -  TCO2 22 - 32 mmol/L - - 26  - -  Acid-base deficit 0.0 - 2.0 mmol/L - - 2.0  - -  O2 Saturation % - - 99.0  - -    Details            Capillary Blood Glucose: No results found for: "GLUCAP"   Pulmonary Assessment Scores:  Pulmonary Assessment Scores     Row Name 11/29/22 0841         ADL UCSD   ADL Phase Entry     SOB Score total 22       CAT Score   CAT Score 18       mMRC Score   mMRC Score 1             UCSD: Self-administered rating of dyspnea associated with activities of daily living (ADLs) 6-point scale (0 = "not at all" to 5 = "maximal or unable to do because of breathlessness")  Scoring Scores range from 0 to 120.  Minimally important difference is 5 units  CAT: CAT can identify the health impairment of COPD patients and is better correlated with disease progression.  CAT has a scoring range of zero to 40. The CAT score is classified into four groups of low (less than 10), medium (10 - 20), high (21-30) and very high (31-40) based on the impact level of disease on health status. A CAT score over 10 suggests significant symptoms.  A worsening CAT score could be explained by an exacerbation, poor medication adherence, poor inhaler technique, or progression of COPD or comorbid conditions.  CAT MCID is 2 points  mMRC: mMRC (Modified Medical Research Council) Dyspnea Scale is used to assess the degree of baseline functional disability in patients of respiratory disease due to dyspnea. No minimal important difference is established. A decrease in score of 1 point or greater is considered a positive change.   Pulmonary Function Assessment:  Pulmonary Function Assessment - 11/29/22 0832       Breath   Bilateral Breath Sounds Clear    Shortness of Breath Yes;Limiting activity             Exercise Target Goals: Exercise  Program Goal: Individual exercise prescription set using results from  initial 6 min walk test and THRR while considering  patient's activity barriers and safety.   Exercise Prescription Goal: Initial exercise prescription builds to 30-45 minutes a day of aerobic activity, 2-3 days per week.  Home exercise guidelines will be given to patient during program as part of exercise prescription that the participant will acknowledge.  Activity Barriers & Risk Stratification:  Activity Barriers & Cardiac Risk Stratification - 11/29/22 0833       Activity Barriers & Cardiac Risk Stratification   Activity Barriers Neck/Spine Problems;Deconditioning;Muscular Weakness;Shortness of Breath;Joint Problems;Back Problems             6 Minute Walk:  6 Minute Walk     Row Name 11/29/22 0956         6 Minute Walk   Phase Initial     Distance 1250 feet     Walk Time 6 minutes     # of Rest Breaks 0     MPH 2.37     METS 1.67     RPE 9     Perceived Dyspnea  1     VO2 Peak 5.85     Symptoms No     Resting HR 79 bpm     Resting BP 104/60     Resting Oxygen Saturation  95 %     Exercise Oxygen Saturation  during 6 min walk 91 %     Max Ex. HR 93 bpm     Max Ex. BP 110/60     2 Minute Post BP 118/68  taken after H2O given       Interval HR   1 Minute HR 81     2 Minute HR 87     3 Minute HR 90     4 Minute HR 90     5 Minute HR 93     6 Minute HR 78     2 Minute Post HR 77     Interval Heart Rate? Yes       Interval Oxygen   Interval Oxygen? Yes     Baseline Oxygen Saturation % 95 %     1 Minute Oxygen Saturation % 95 %     1 Minute Liters of Oxygen 0 L     2 Minute Oxygen Saturation % 92 %     2 Minute Liters of Oxygen 0 L     3 Minute Oxygen Saturation % 92 %     3 Minute Liters of Oxygen 0 L     4 Minute Oxygen Saturation % 91 %     4 Minute Liters of Oxygen 0 L     5 Minute Oxygen Saturation % 92 %     5 Minute Liters of Oxygen 0 L     6 Minute Oxygen Saturation % 93 %      6 Minute Liters of Oxygen 0 L     2 Minute Post Oxygen Saturation % 98 %     2 Minute Post Liters of Oxygen 0 L              Oxygen Initial Assessment:  Oxygen Initial Assessment - 11/29/22 0832       Home Oxygen   Home Oxygen Device None    Sleep Oxygen Prescription CPAP    Home Exercise Oxygen Prescription None    Home Resting Oxygen Prescription None    Compliance with Home Oxygen Use No      Initial 6 min Walk  Oxygen Used None      Program Oxygen Prescription   Program Oxygen Prescription None      Intervention   Short Term Goals To learn and exhibit compliance with exercise, home and travel O2 prescription;To learn and understand importance of maintaining oxygen saturations>88%;To learn and demonstrate proper use of respiratory medications;To learn and understand importance of monitoring SPO2 with pulse oximeter and demonstrate accurate use of the pulse oximeter.;To learn and demonstrate proper pursed lip breathing techniques or other breathing techniques.     Long  Term Goals Exhibits compliance with exercise, home  and travel O2 prescription;Verbalizes importance of monitoring SPO2 with pulse oximeter and return demonstration;Maintenance of O2 saturations>88%;Exhibits proper breathing techniques, such as pursed lip breathing or other method taught during program session;Compliance with respiratory medication;Demonstrates proper use of MDI's             Oxygen Re-Evaluation:  Oxygen Re-Evaluation     Row Name 11/29/22 1509 12/28/22 0837           Program Oxygen Prescription   Program Oxygen Prescription None None        Home Oxygen   Home Oxygen Device None None      Sleep Oxygen Prescription CPAP CPAP      Home Exercise Oxygen Prescription None None      Home Resting Oxygen Prescription None None      Compliance with Home Oxygen Use No No        Goals/Expected Outcomes   Short Term Goals To learn and exhibit compliance with exercise, home and travel  O2 prescription;To learn and understand importance of maintaining oxygen saturations>88%;To learn and demonstrate proper use of respiratory medications;To learn and understand importance of monitoring SPO2 with pulse oximeter and demonstrate accurate use of the pulse oximeter.;To learn and demonstrate proper pursed lip breathing techniques or other breathing techniques.  To learn and exhibit compliance with exercise, home and travel O2 prescription;To learn and understand importance of maintaining oxygen saturations>88%;To learn and demonstrate proper use of respiratory medications;To learn and understand importance of monitoring SPO2 with pulse oximeter and demonstrate accurate use of the pulse oximeter.;To learn and demonstrate proper pursed lip breathing techniques or other breathing techniques.       Long  Term Goals Exhibits compliance with exercise, home  and travel O2 prescription;Verbalizes importance of monitoring SPO2 with pulse oximeter and return demonstration;Maintenance of O2 saturations>88%;Exhibits proper breathing techniques, such as pursed lip breathing or other method taught during program session;Compliance with respiratory medication;Demonstrates proper use of MDI's Exhibits compliance with exercise, home  and travel O2 prescription;Verbalizes importance of monitoring SPO2 with pulse oximeter and return demonstration;Maintenance of O2 saturations>88%;Exhibits proper breathing techniques, such as pursed lip breathing or other method taught during program session;Compliance with respiratory medication;Demonstrates proper use of MDI's      Goals/Expected Outcomes Compliance and understanding of oxygen saturation monitoring and breathing techniques to decrease shortness of breath Compliance and understanding of oxygen saturation monitoring and breathing techniques to decrease shortness of breath               Oxygen Discharge (Final Oxygen Re-Evaluation):  Oxygen Re-Evaluation - 12/28/22  0837       Program Oxygen Prescription   Program Oxygen Prescription None      Home Oxygen   Home Oxygen Device None    Sleep Oxygen Prescription CPAP    Home Exercise Oxygen Prescription None    Home Resting Oxygen Prescription None    Compliance with Home Oxygen Use  No      Goals/Expected Outcomes   Short Term Goals To learn and exhibit compliance with exercise, home and travel O2 prescription;To learn and understand importance of maintaining oxygen saturations>88%;To learn and demonstrate proper use of respiratory medications;To learn and understand importance of monitoring SPO2 with pulse oximeter and demonstrate accurate use of the pulse oximeter.;To learn and demonstrate proper pursed lip breathing techniques or other breathing techniques.     Long  Term Goals Exhibits compliance with exercise, home  and travel O2 prescription;Verbalizes importance of monitoring SPO2 with pulse oximeter and return demonstration;Maintenance of O2 saturations>88%;Exhibits proper breathing techniques, such as pursed lip breathing or other method taught during program session;Compliance with respiratory medication;Demonstrates proper use of MDI's    Goals/Expected Outcomes Compliance and understanding of oxygen saturation monitoring and breathing techniques to decrease shortness of breath             Initial Exercise Prescription:  Initial Exercise Prescription - 11/29/22 0900       Date of Initial Exercise RX and Referring Provider   Date 11/29/22    Referring Provider Mannam    Expected Discharge Date 02/23/22      Recumbant Elliptical   Level 2    RPM 55    Watts 80    Minutes 15      Track   Minutes 15    METs 2      Prescription Details   Frequency (times per week) 2    Duration Progress to 30 minutes of continuous aerobic without signs/symptoms of physical distress      Intensity   THRR 40-80% of Max Heartrate 54-108    Ratings of Perceived Exertion 11-13    Perceived Dyspnea  0-4      Progression   Progression Continue to progress workloads to maintain intensity without signs/symptoms of physical distress.      Resistance Training   Training Prescription Yes    Weight blue bands             Perform Capillary Blood Glucose checks as needed.  Exercise Prescription Changes:   Exercise Prescription Changes     Row Name 12/07/22 1500 12/21/22 1500 01/04/23 1300         Response to Exercise   Blood Pressure (Admit) 104/58 120/62 106/60     Blood Pressure (Exercise) 120/60 146/72 166/80     Blood Pressure (Exit) 94/52 102/60 114/60     Heart Rate (Admit) 71 bpm 77 bpm 85 bpm     Heart Rate (Exercise) 112 bpm 125 bpm 137 bpm  Had pt decrease speed d/t exceeding HR max     Heart Rate (Exit) 81 bpm 90 bpm 93 bpm     Oxygen Saturation (Admit) 97 % 94 % 95 %     Oxygen Saturation (Exercise) 90 % 90 % 88 %     Oxygen Saturation (Exit) 95 % 95 % 93 %     Rating of Perceived Exertion (Exercise) 9 11 13      Perceived Dyspnea (Exercise) 1 1 3      Duration Continue with 30 min of aerobic exercise without signs/symptoms of physical distress. Continue with 30 min of aerobic exercise without signs/symptoms of physical distress. Continue with 30 min of aerobic exercise without signs/symptoms of physical distress.     Intensity THRR unchanged THRR unchanged THRR unchanged       Progression   Progression Continue to progress workloads to maintain intensity without signs/symptoms of physical distress. Continue to  progress workloads to maintain intensity without signs/symptoms of physical distress. Continue to progress workloads to maintain intensity without signs/symptoms of physical distress.       Resistance Training   Training Prescription Yes Yes Yes     Weight blue bands blue bands black bands     Reps 10-15 10-15 10-15     Time 10 Minutes 10 Minutes 10 Minutes       Treadmill   MPH -- 2.6 2.7     Grade -- 0 0     Minutes -- 15 15     METs -- 2.99 2.9        Recumbant Elliptical   Level 2 6 7      RPM 60 -- 61     Watts -- -- 105     Minutes 15 15 15      METs 3.2 4.8 4.5       Track   Laps 12 -- --     Minutes 15 -- --     METs 2.85 -- --              Exercise Comments:   Exercise Comments     Row Name 12/07/22 1508           Exercise Comments Pt completed first day of exercise. Charles Wright exercised for 15 min on recumbent elliptical and track. Charles Wright averaged 3.2 METs at level 2 on the recumbent elliptical and 2.85 METs on the track. He performed the warmup and cooldown standing without limitations. Discussed METs. Pt voiced understanding.                Exercise Goals and Review:   Exercise Goals     Row Name 11/29/22 [id number]             Exercise Goals   Increase Physical Activity Yes       Intervention Provide advice, education, support and counseling about physical activity/exercise needs.;Develop an individualized exercise prescription for aerobic and resistive training based on initial evaluation findings, risk stratification, comorbidities and participant's personal goals.       Expected Outcomes Short Term: Attend rehab on a regular basis to increase amount of physical activity.;Long Term: Add in home exercise to make exercise part of routine and to increase amount of physical activity.;Long Term: Exercising regularly at least 3-5 days a week.       Increase Strength and Stamina Yes       Intervention Provide advice, education, support and counseling about physical activity/exercise needs.;Develop an individualized exercise prescription for aerobic and resistive training based on initial evaluation findings, risk stratification, comorbidities and participant's personal goals.       Expected Outcomes Short Term: Increase workloads from initial exercise prescription for resistance, speed, and METs.;Short Term: Perform resistance training exercises routinely during rehab and add in resistance training at home;Long Term:  Improve cardiorespiratory fitness, muscular endurance and strength as measured by increased METs and functional capacity ( )       Able to understand and use rate of perceived exertion (RPE) scale Yes       Intervention Provide education and explanation on how to use RPE scale       Expected Outcomes Short Term: Able to use RPE daily in rehab to express subjective intensity level;Long Term:  Able to use RPE to guide intensity level when exercising independently       Able to understand and use Dyspnea scale Yes       Intervention Provide education  and explanation on how to use Dyspnea scale       Expected Outcomes Short Term: Able to use Dyspnea scale daily in rehab to express subjective sense of shortness of breath during exertion;Long Term: Able to use Dyspnea scale to guide intensity level when exercising independently       Knowledge and understanding of Target Heart Rate Range (THRR) Yes       Intervention Provide education and explanation of THRR including how the numbers were predicted and where they are located for reference       Expected Outcomes Short Term: Able to state/look up THRR;Long Term: Able to use THRR to govern intensity when exercising independently;Short Term: Able to use daily as guideline for intensity in rehab       Understanding of Exercise Prescription Yes       Intervention Provide education, explanation, and written materials on patient's individual exercise prescription       Expected Outcomes Short Term: Able to explain program exercise prescription;Long Term: Able to explain home exercise prescription to exercise independently                Exercise Goals Re-Evaluation :  Exercise Goals Re-Evaluation     Row Name 11/29/22 1508 12/28/22 0832           Exercise Goal Re-Evaluation   Exercise Goals Review Increase Physical Activity;Able to understand and use Dyspnea scale;Understanding of Exercise Prescription;Increase Strength and Stamina;Knowledge and  understanding of Target Heart Rate Range (THRR);Able to understand and use rate of perceived exertion (RPE) scale Increase Physical Activity;Able to understand and use Dyspnea scale;Understanding of Exercise Prescription;Increase Strength and Stamina;Knowledge and understanding of Target Heart Rate Range (THRR);Able to understand and use rate of perceived exertion (RPE) scale      Comments Pt is scheduled to begin group exercise 10/29. Will monitor for progression. Pt has completed 6 exercise sessions with perfect attendance. Charles Wright is exercising for 15 min on recumbent elliptical and treadmill. Charles Wright averages 5.1 METs at level 6 on the recumbent elliptical. He is walking on the treadmill at 2.7 mph, METs 2.9. He is very motivated. He performed the warmup and cooldown standing without limitations.  Will monitor for progression.      Expected Outcomes Through exercise at rehab and at home, the patient will decrease shortness of breath with daily activities and feel confident in carrying out an exercise regime at home. Through exercise at rehab and at home, the patient will decrease shortness of breath with daily activities and feel confident in carrying out an exercise regime at home.               Discharge Exercise Prescription (Final Exercise Prescription Changes):  Exercise Prescription Changes - 01/04/23 1300       Response to Exercise   Blood Pressure (Admit) 106/60    Blood Pressure (Exercise) 166/80    Blood Pressure (Exit) 114/60    Heart Rate (Admit) 85 bpm    Heart Rate (Exercise) 137 bpm   Had pt decrease speed d/t exceeding HR max   Heart Rate (Exit) 93 bpm    Oxygen Saturation (Admit) 95 %    Oxygen Saturation (Exercise) 88 %    Oxygen Saturation (Exit) 93 %    Rating of Perceived Exertion (Exercise) 13    Perceived Dyspnea (Exercise) 3    Duration Continue with 30 min of aerobic exercise without signs/symptoms of physical distress.    Intensity THRR unchanged      Progression  Progression Continue to progress workloads to maintain intensity without signs/symptoms of physical distress.      Resistance Training   Training Prescription Yes    Weight black bands    Reps 10-15    Time 10 Minutes      Treadmill   MPH 2.7    Grade 0    Minutes 15    METs 2.9      Recumbant Elliptical   Level 7    RPM 61    Watts 105    Minutes 15    METs 4.5             Nutrition:  Target Goals: Understanding of nutrition guidelines, daily intake of sodium 1500mg , cholesterol 200mg , calories 30% from fat and 7% or less from saturated fats, daily to have 5 or more servings of fruits and vegetables.  Biometrics:  Pre Biometrics - 11/29/22 0828       Pre Biometrics   Grip Strength 30 kg              Nutrition Therapy Plan and Nutrition Goals:  Nutrition Therapy & Goals - 01/04/23 1529       Nutrition Therapy   Diet Heart Healthy Diet    Drug/Food Interactions Statins/Certain Fruits      Personal Nutrition Goals   Nutrition Goal Patient to improve diet quality by using the plate method as a guide for meal planning to include lean protein/plant protein, fruits, vegetables, whole grains, nonfat dairy as part of a well-balanced diet.   goal in progress.   Comments Charles Wright has medical history of NSTEMI, hyperlipidemia, sleep apnea, hx of TIA, ILD, afib ablation july 2024. His LDL is at goal. He reports no nutrition concerns/goals at this time. He reports eating a wide variety of foods, two meals daily, and stable appetite. He has maintained his weight since starting with our program.His wife is a good support.  Patient will benefit from participation in pulmonary rehab for nutrition, exercise, and lifestyle modification.      Intervention Plan   Intervention Prescribe, educate and counsel regarding individualized specific dietary modifications aiming towards targeted core components such as weight, hypertension, lipid management, diabetes, heart failure and  other comorbidities.;Nutrition handout(s) given to patient.    Expected Outcomes Short Term Goal: Understand basic principles of dietary content, such as calories, fat, sodium, cholesterol and nutrients.;Long Term Goal: Adherence to prescribed nutrition plan.             Nutrition Assessments:  Nutrition Assessments - 12/07/22 1533       Rate Your Plate Scores   Pre Score 49            MEDIFICTS Score Key: >=70 Need to make dietary changes  40-70 Heart Healthy Diet <= 40 Therapeutic Level Cholesterol Diet  Flowsheet Row PULMONARY REHAB OTHER RESPIRATORY from 12/07/2022 in [facility]  Picture Your Plate Total Score on Admission 49      Picture Your Plate Scores: <21 Unhealthy dietary pattern with much room for improvement. 41-50 Dietary pattern unlikely to meet recommendations for good health and room for improvement. 51-60 More healthful dietary pattern, with some room for improvement.  >60 Healthy dietary pattern, although there may be some specific behaviors that could be improved.    Nutrition Goals Re-Evaluation:  Nutrition Goals Re-Evaluation     Row Name 12/07/22 1343 01/04/23 1529           Goals  Current Weight 191 lb 5.8 oz (86.8 kg) 190 lb 7.6 oz (86.4 kg)      Comment LDL 55, HDL 38, GFR 44, HDL 38 no new labs; most recent labs  LDL 55, HDL 38, GFR 44, HDL 38      Expected Outcome Gerron has medical history of NSTEMI, hyperlipidemia, sleep apnea, hx of TIA, ILD, afib ablation july 2024. His LDL is at goal. He reports no nutrition concerns/goals at this time. He reports eating a wide variety of foods, two meals daily, and stable appetite. His wife is a good support. Patient will benefit from participation in pulmonary rehab for nutrition, exercise, and lifestyle modification. Charles Wright has medical history of NSTEMI, hyperlipidemia, sleep apnea, hx of TIA, ILD, afib ablation july 2024. His LDL is at goal.  He reports no nutrition concerns/goals at this time. He reports eating a wide variety of foods, two meals daily, and stable appetite. He has maintained his weight since starting with our program.His wife is a good support. Patient will benefit from participation in pulmonary rehab for nutrition, exercise, and lifestyle modification.               Nutrition Goals Discharge (Final Nutrition Goals Re-Evaluation):  Nutrition Goals Re-Evaluation - 01/04/23 1529       Goals   Current Weight 190 lb 7.6 oz (86.4 kg)    Comment no new labs; most recent labs  LDL 55, HDL 38, GFR 44, HDL 38    Expected Outcome Charles Wright has medical history of NSTEMI, hyperlipidemia, sleep apnea, hx of TIA, ILD, afib ablation july 2024. His LDL is at goal. He reports no nutrition concerns/goals at this time. He reports eating a wide variety of foods, two meals daily, and stable appetite. He has maintained his weight since starting with our program.His wife is a good support. Patient will benefit from participation in pulmonary rehab for nutrition, exercise, and lifestyle modification.             Psychosocial: Target Goals: Acknowledge presence or absence of significant depression and/or stress, maximize coping skills, provide positive support system. Participant is able to verbalize types and ability to use techniques and skills needed for reducing stress and depression.  Initial Review & Psychosocial Screening:  Initial Psych Review & Screening - 11/29/22 0834       Initial Review   Current issues with None Identified      Family Dynamics   Good Support System? Yes    Comments spouse      Barriers   Psychosocial barriers to participate in program There are no identifiable barriers or psychosocial needs.      Screening Interventions   Interventions Encouraged to exercise             Quality of Life Scores:  Scores of 19 and below usually indicate a poorer quality of life in these areas.  A  difference of  2-3 points is a clinically meaningful difference.  A difference of 2-3 points in the total score of the Quality of Life Index has been associated with significant improvement in overall quality of life, self-image, physical symptoms, and general health in studies assessing change in quality of life.  PHQ-9: Review Flowsheet  More data exists      11/29/2022 06/01/2017 05/04/2017 10/07/2016 12/24/2014  Depression screen PHQ 2/9  Decreased Interest 0 0 0 0 0  Down, Depressed, Hopeless 0 0 0 0 0  PHQ - 2 Score 0 0 0 0 0  Altered sleeping 0 - - - -  Tired, decreased energy 0 - - - -  Change in appetite 0 - - - -  Feeling bad or failure about yourself  0 - - - -  Trouble concentrating 0 - - - -  Moving slowly or fidgety/restless 0 - - - -  Suicidal thoughts 0 - - - -  PHQ-9 Score 0 - - - -  Difficult doing work/chores Not difficult at all - - - -    Details           Interpretation of Total Score  Total Score Depression Severity:  1-4 = Minimal depression, 5-9 = Mild depression, 10-14 = Moderate depression, 15-19 = Moderately severe depression, 20-27 = Severe depression   Psychosocial Evaluation and Intervention:  Psychosocial Evaluation - 11/29/22 0834       Psychosocial Evaluation & Interventions   Interventions Encouraged to exercise with the program and follow exercise prescription    Comments Pt denies any psychosocial needs at this time.    Expected Outcomes For Charles Wright to participate in rehab free of psychosocial barriers.    Continue Psychosocial Services  No Follow up required             Psychosocial Re-Evaluation:  Psychosocial Re-Evaluation     Row Name 12/03/22 1018 12/31/22 0955           Psychosocial Re-Evaluation   Current issues with None Identified None Identified      Comments No changes in prior assessment. Charles Wright is scheduled to start the program on 12/07/22. No changes in monthly re-assessment. Charles Wright denies any psychosocial needs at this  time. He states he has great support from his wife and family.      Expected Outcomes For Charles Wright to participate in rehab free of psychosocial barriers. For Charles Wright to participate in rehab free of psychosocial barriers.      Interventions Encouraged to attend Pulmonary Rehabilitation for the exercise Encouraged to attend Pulmonary Rehabilitation for the exercise      Continue Psychosocial Services  No Follow up required No Follow up required               Psychosocial Discharge (Final Psychosocial Re-Evaluation):  Psychosocial Re-Evaluation - 12/31/22 0955       Psychosocial Re-Evaluation   Current issues with None Identified    Comments No changes in monthly re-assessment. Charles Wright denies any psychosocial needs at this time. He states he has great support from his wife and family.    Expected Outcomes For Charles Wright to participate in rehab free of psychosocial barriers.    Interventions Encouraged to attend Pulmonary Rehabilitation for the exercise    Continue Psychosocial Services  No Follow up required             Education: Education Goals: Education classes will be provided on a weekly basis, covering required topics. Participant will state understanding/return demonstration of topics presented.  Learning Barriers/Preferences:  Learning Barriers/Preferences - 11/29/22 0835       Learning Barriers/Preferences   Learning Barriers Hearing    Learning Preferences Skilled Demonstration             Education Topics: Know Your Numbers Group instruction that is supported by a PowerPoint presentation. Instructor discusses importance of knowing and understanding resting, exercise, and post-exercise oxygen saturation, heart rate, and blood pressure. Oxygen saturation, heart rate, blood pressure, rating of perceived exertion, and dyspnea are reviewed along with a normal range for these values.  Exercise for the Pulmonary Patient Group instruction that is supported by a PowerPoint  presentation. Instructor discusses benefits of exercise, core components of exercise, frequency, duration, and intensity of an exercise routine, importance of utilizing pulse oximetry during exercise, safety while exercising, and options of places to exercise outside of rehab.    MET Level  Group instruction provided by PowerPoint, verbal discussion, and written material to support subject matter. Instructor reviews what METs are and how to increase METs.    Pulmonary Medications Verbally interactive group education provided by instructor with focus on inhaled medications and proper administration.   Anatomy and Physiology of the Respiratory System Group instruction provided by PowerPoint, verbal discussion, and written material to support subject matter. Instructor reviews respiratory cycle and anatomical components of the respiratory system and their functions. Instructor also reviews differences in obstructive and restrictive respiratory diseases with examples of each.    Oxygen Safety Group instruction provided by PowerPoint, verbal discussion, and written material to support subject matter. There is an overview of "What is Oxygen" and "Why do we need it".  Instructor also reviews how to create a safe environment for oxygen use, the importance of using oxygen as prescribed, and the risks of noncompliance. There is a brief discussion on traveling with oxygen and resources the patient may utilize.   Oxygen Use Group instruction provided by PowerPoint, verbal discussion, and written material to discuss how supplemental oxygen is prescribed and different types of oxygen supply systems. Resources for more information are provided.    Breathing Techniques Group instruction that is supported by demonstration and informational handouts. Instructor discusses the benefits of pursed lip and diaphragmatic breathing and detailed demonstration on how to perform both.     Risk Factor Reduction Group  instruction that is supported by a PowerPoint presentation. Instructor discusses the definition of a risk factor, different risk factors for pulmonary disease, and how the heart and lungs work together. Flowsheet Row PULMONARY REHAB OTHER RESPIRATORY from 12/23/2022 in [facility]  Date 12/23/22  Educator EP  Instruction Review Code 1- Verbalizes Understanding       Pulmonary Diseases Group instruction provided by PowerPoint, verbal discussion, and written material to support subject matter. Instructor gives an overview of the different type of pulmonary diseases. There is also a discussion on risk factors and symptoms as well as ways to manage the diseases.   Stress and Energy Conservation Group instruction provided by PowerPoint, verbal discussion, and written material to support subject matter. Instructor gives an overview of stress and the impact it can have on the body. Instructor also reviews ways to reduce stress. There is also a discussion on energy conservation and ways to conserve energy throughout the day. Flowsheet Row PULMONARY REHAB OTHER RESPIRATORY from 12/09/2022 in [facility] for [facility], [facility], [facility]  Date 12/09/22  Educator Rn  Instruction Review Code 1- Verbalizes Understanding       Warning Signs and Symptoms Group instruction provided by PowerPoint, verbal discussion, and written material to support subject matter. Instructor reviews warning signs and symptoms of stroke, heart attack, cold and flu. Instructor also reviews ways to prevent the spread of infection. Flowsheet Row PULMONARY REHAB OTHER RESPIRATORY from 12/16/2022 in [facility], [facility]  Date 12/16/22  Educator RN  Instruction Review Code 1- Verbalizes Understanding       Other Education Group or individual verbal, written, or  video instructions that support  the educational goals of the pulmonary rehab program.    Knowledge Questionnaire Score:  Knowledge Questionnaire Score - 11/29/22 0836       Knowledge Questionnaire Score   Pre Score 16/18             Core Components/Risk Factors/Patient Goals at Admission:  Personal Goals and Risk Factors at Admission - 11/29/22 0835       Core Components/Risk Factors/Patient Goals on Admission    Weight Management Weight Loss;Yes    Intervention Weight Management: Develop a combined nutrition and exercise program designed to reach desired caloric intake, while maintaining appropriate intake of nutrient and fiber, sodium and fats, and appropriate energy expenditure required for the weight goal.;Weight Management: Provide education and appropriate resources to help participant work on and attain dietary goals.;Weight Management/Obesity: Establish reasonable short term and long term weight goals.;Obesity: Provide education and appropriate resources to help participant work on and attain dietary goals.    Expected Outcomes Short Term: Continue to assess and modify interventions until short term weight is achieved;Long Term: Adherence to nutrition and physical activity/exercise program aimed toward attainment of established weight goal;Weight Maintenance: Understanding of the daily nutrition guidelines, which includes 25-35% calories from fat, 7% or less cal from saturated fats, less than 200mg  cholesterol, less than 1.5gm of sodium, & 5 or more servings of fruits and vegetables daily;Weight Loss: Understanding of general recommendations for a balanced deficit meal plan, which promotes 1-2 lb weight loss per week and includes a negative energy balance of [phone] kcal/d;Understanding of distribution of calorie intake throughout the day with the consumption of 4-5 meals/snacks;Understanding recommendations for meals to include 15-35% energy as protein, 25-35% energy from fat, 35-60% energy from carbohydrates, less  than 200mg  of dietary cholesterol, 20-35 gm of total fiber daily    Improve shortness of breath with ADL's Yes    Intervention Provide education, individualized exercise plan and daily activity instruction to help decrease symptoms of SOB with activities of daily living.    Expected Outcomes Short Term: Improve cardiorespiratory fitness to achieve a reduction of symptoms when performing ADLs;Long Term: Be able to perform more ADLs without symptoms or delay the onset of symptoms             Core Components/Risk Factors/Patient Goals Review:   Goals and Risk Factor Review     Row Name 12/03/22 1019 12/31/22 0956           Core Components/Risk Factors/Patient Goals Review   Personal Goals Review Weight Management/Obesity;Improve shortness of breath with ADL's;Develop more efficient breathing techniques such as purse lipped breathing and diaphragmatic breathing and practicing self-pacing with activity. Weight Management/Obesity;Improve shortness of breath with ADL's;Develop more efficient breathing techniques such as purse lipped breathing and diaphragmatic breathing and practicing self-pacing with activity.      Review No changes from orientation. Charles Wright is scheduled to start the program on 12/07/22. Core components/risk factors/patient goals monthly re-evaluate are as follows: Charles Wright has not met his goal to lose any weight this month. He is up in weight from starting the program. He reports eating 2 meals a day and eating a wide variety of foods. Charles Wright has been open to suggestions from our dietitian but hasn't implanted suggestions yet. We will continue to support Charles Wright in his goal to lose weight. Goal in progress on improving SOB with ADLs and develop more efficient breathing techniques such as purse lipped breathing and diaphragmatic breathing; and practicing self-pacing with activity. 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 Charles Wright has been quickly increasing both his workload and METs and has been exercising here with great motivation to get to where he once was. With supervision of staff, he has been making excellent progress and we will continue to monitor and evaluate. He will continue to benefit from participation in PR for nutrition, education, exercise, and lifestyle modification.      Expected Outcomes For Charles Wright to lose weight, improve his SOB with ADLs, and develop more efficient breathing techniques such as purse lipped breathing and diaphragmatic breathing; and practicing self-pacing with activity For Charles Wright to lose weight, improve his SOB with ADLs, and develop more efficient breathing techniques such as purse lipped breathing and diaphragmatic breathing; and practicing self-pacing with activity               Core Components/Risk Factors/Patient Goals at Discharge (Final Review):   Goals and Risk Factor Review - 12/31/22 0956       Core Components/Risk Factors/Patient Goals Review   Personal Goals Review Weight Management/Obesity;Improve shortness of breath with ADL's;Develop more efficient breathing techniques such as purse lipped breathing and diaphragmatic breathing and practicing self-pacing with activity.    Review Core components/risk factors/patient goals monthly re-evaluate are as follows: Vinny has not met his goal to lose any weight this month. He is up in weight from starting the program. He reports eating 2 meals a day and eating a wide variety of foods. Glennon has been open to suggestions from our dietitian but hasn't implanted suggestions yet. We will continue to support Conan in his goal to lose weight. Goal in progress on improving SOB with ADLs and develop more efficient breathing techniques such as purse lipped breathing and diaphragmatic breathing; and practicing self-pacing with activity. Rose is learning on how to initiate these techniques before becoming severely  short of breath or tired. Nephtali is athlete who likes to push himself. Kannon has been quickly increasing both his workload and METs and has been exercising here with great motivation to get to where he once was. With supervision of staff, he has been making excellent progress and we will continue to monitor and evaluate. He will continue to benefit from participation in PR for nutrition, education, exercise, and lifestyle modification.    Expected Outcomes For Nobel to lose weight, improve his SOB with ADLs, and develop more efficient breathing techniques such as purse lipped breathing and diaphragmatic breathing; and practicing self-pacing with activity             ITP Comments: Pt is making expected progress toward Pulmonary Rehab goals after completing 8 session(s). Recommend continued exercise, life style modification, education, and utilization of breathing techniques to increase stamina and strength, while also decreasing shortness of breath with exertion.  Dr. Mechele Collin is Medical Director for Pulmonary Rehab at [facility].

## 2023-01-10 NOTE — Telephone Encounter (Signed)
Yes. That is fine to increase the target HR. Thanks

## 2023-01-11 ENCOUNTER — Encounter (HOSPITAL_COMMUNITY)
Admission: RE | Admit: 2023-01-11 | Discharge: 2023-01-11 | Disposition: A | Discharge status: Home / Self Care | Payer: Medicare Other | Source: Ambulatory Visit | Attending: Pulmonary Disease | Admitting: Pulmonary Disease

## 2023-01-11 DIAGNOSIS — J849 Interstitial pulmonary disease, unspecified: Secondary | ICD-10-CM | POA: Diagnosis not present

## 2023-01-11 NOTE — Progress Notes (Signed)
Daily Session Note  Patient Details  Name: Charles Wright MRN: [id number] Date of Birth: November 25, 1935 Referring Provider:   Doristine Devoid Pulmonary Rehab Walk Test from 11/29/2022 in [facility]  Referring Provider Mannam       Encounter Date: 01/11/2023  Check In:  Session Check In - 01/11/23 1427       Check-In   Supervising physician immediately available to respond to emergencies [facility] MD immediately available    Physician(s) Eligha Bridegroom, NP    Location [facility]    Staff Present Raford Pitcher, MS, ACSM-CEP, Exercise Physiologist;Randi Dionisio Paschal, ACSM-CEP, Exercise Physiologist;Genese Quebedeaux Gerre Scull, RN, Marton Redwood, MS, ACSM-CEP, CCRP, Exercise Physiologist    Virtual Visit No    Medication changes reported     No    Fall or balance concerns reported    No    Tobacco Cessation No Change    Warm-up and Cool-down Performed as group-led instruction    Resistance Training Performed Yes    VAD Patient? No    PAD/SET Patient? No      Pain Assessment   Currently in Pain? No/denies    Multiple Pain Sites No             Capillary Blood Glucose: No results found for this or any previous visit (from the past 24 hour(s)).    Social History   Tobacco Use  Smoking Status Never  Smokeless Tobacco Never    Goals Met:  Independence with exercise equipment Exercise tolerated well No report of concerns or symptoms today Strength training completed today  Goals Unmet:  Not Applicable  Comments: Service time is from 1307 to 1432    Dr. Mechele Collin is Medical Director for Pulmonary Rehab at [facility].

## 2023-01-12 DIAGNOSIS — H9193 Unspecified hearing loss, bilateral: Secondary | ICD-10-CM | POA: Diagnosis not present

## 2023-01-12 DIAGNOSIS — Z45321 Encounter for adjustment and management of cochlear device: Secondary | ICD-10-CM | POA: Diagnosis not present

## 2023-01-12 DIAGNOSIS — Z9621 Cochlear implant status: Secondary | ICD-10-CM | POA: Diagnosis not present

## 2023-01-13 ENCOUNTER — Encounter (HOSPITAL_COMMUNITY)
Admission: RE | Admit: 2023-01-13 | Discharge: 2023-01-13 | Disposition: A | Discharge status: Home / Self Care | Payer: Medicare Other | Source: Ambulatory Visit | Attending: Pulmonary Disease | Admitting: Pulmonary Disease

## 2023-01-13 DIAGNOSIS — J849 Interstitial pulmonary disease, unspecified: Secondary | ICD-10-CM

## 2023-01-13 NOTE — Progress Notes (Signed)
Daily Session Note  Patient Details  Name: Charles Wright MRN: [id number] Date of Birth: [birth year] Referring Provider:   Doristine Devoid Pulmonary Rehab Walk Test from 11/29/2022 in [facility]  Referring Provider Mannam       Encounter Date: 01/13/2023  Check In:  Session Check In - 01/13/23 1346       Check-In   Supervising physician immediately available to respond to emergencies [facility] MD immediately available    Physician(s) Edd Fabian, NP    Location [facility]    Staff Present Raford Pitcher, MS, ACSM-CEP, Exercise Physiologist;Randi Dionisio Paschal, ACSM-CEP, Exercise Physiologist;Mary Gerre Scull, RN, Fuller Plan, RT    Virtual Visit No    Medication changes reported     No    Fall or balance concerns reported    No    Tobacco Cessation No Change    Warm-up and Cool-down Performed as group-led instruction    Resistance Training Performed Yes    VAD Patient? No    PAD/SET Patient? No      Pain Assessment   Currently in Pain? No/denies    Multiple Pain Sites No             Capillary Blood Glucose: No results found for this or any previous visit (from the past 24 hour(s)).    Social History   Tobacco Use  Smoking Status Never  Smokeless Tobacco Never    Goals Met:  Proper associated with RPD/PD & O2 Sat Independence with exercise equipment Exercise tolerated well No report of concerns or symptoms today Strength training completed today  Goals Unmet:  Not Applicable  Comments: Service time is from 1313 to 1442.    Dr. Mechele Collin is Medical Director for Pulmonary Rehab at [facility].

## 2023-01-18 ENCOUNTER — Encounter (HOSPITAL_COMMUNITY)
Admission: RE | Admit: 2023-01-18 | Discharge: 2023-01-18 | Disposition: A | Discharge status: Home / Self Care | Payer: Medicare Other | Source: Ambulatory Visit | Attending: Pulmonary Disease | Admitting: Pulmonary Disease

## 2023-01-18 VITALS — Wt 189.2 lb

## 2023-01-18 DIAGNOSIS — J849 Interstitial pulmonary disease, unspecified: Secondary | ICD-10-CM

## 2023-01-18 NOTE — Progress Notes (Signed)
Home Exercise Prescription I have reviewed a Home Exercise Prescription with Hessie Diener. He is active in his yard and walks slowly with his dog but not doing formal exercise at home. Discussed walking 30 min atleast 1 nonrehab day. He agreed. He also plans to join the gym in January. The patient stated that their goals were to try to run again, which we will work on in class next week. We reviewed exercise guidelines, target heart rate during exercise, RPE Scale, weather conditions, endpoints for exercise, warmup and cool down. The patient is encouraged to come to me with any questions. I will continue to follow up with the patient to assist them with progression and safety.  Spent 15 min discussing home exercise plan and goals.  Jazmine Heckman [place], [place], ACSM-CEP 01/18/2023 3:05 PM

## 2023-01-18 NOTE — Progress Notes (Signed)
Daily Session Note  Patient Details  Name: Charles Wright MRN: [id number] Date of Birth: 06-09-1935 Referring Provider:   Doristine Devoid Pulmonary Rehab Walk Test from 11/29/2022 in [facility]  Referring Provider Mannam       Encounter Date: 01/18/2023  Check In:  Session Check In - 01/18/23 1321       Check-In   Supervising physician immediately available to respond to emergencies [facility] MD immediately available    Physician(s) Carlyon Shadow, NP    Location [facility]    Staff Present Raford Pitcher, MS, ACSM-CEP, Exercise Physiologist;Tracker Mance Dionisio Paschal, ACSM-CEP, Exercise Physiologist;Mary Gerre Scull, RN, Fuller Plan, RT    Virtual Visit No    Medication changes reported     No    Fall or balance concerns reported    No    Tobacco Cessation No Change    Warm-up and Cool-down Performed as group-led instruction    Resistance Training Performed Yes    VAD Patient? No    PAD/SET Patient? No      Pain Assessment   Currently in Pain? No/denies    Multiple Pain Sites No             Capillary Blood Glucose: No results found for this or any previous visit (from the past 24 hour(s)).   Exercise Prescription Changes - 01/18/23 1400       Response to Exercise   Blood Pressure (Admit) 104/66    Blood Pressure (Exercise) 148/60    Blood Pressure (Exit) 112/70    Heart Rate (Admit) 85 bpm    Heart Rate (Exercise) 123 bpm    Heart Rate (Exit) 97 bpm    Oxygen Saturation (Admit) 94 %    Oxygen Saturation (Exercise) 88 %    Oxygen Saturation (Exit) 94 %    Rating of Perceived Exertion (Exercise) 13    Perceived Dyspnea (Exercise) 3    Duration Continue with 30 min of aerobic exercise without signs/symptoms of physical distress.    Intensity THRR New   54-145     Progression   Progression Continue to progress workloads to maintain intensity without signs/symptoms of physical distress.    Average  METs 4.7      Resistance Training   Training Prescription Yes    Weight black bands    Reps 10-15    Time 10 Minutes      Treadmill   MPH 2.7    Grade 1.5    Minutes 15    METs 3.63      Elliptical   Level 1    Speed 1    Minutes 15    METs 4.6      Home Exercise Plan   Plans to continue exercise at Home (comment)   walking   Frequency Add 1 additional day to program exercise sessions.    Initial Home Exercises Provided 01/18/23             Social History   Tobacco Use  Smoking Status Never  Smokeless Tobacco Never    Goals Met:  Independence with exercise equipment Exercise tolerated well No report of concerns or symptoms today Strength training completed today  Goals Unmet:  Not Applicable  Comments: Service time is from 1312 to 1438.    Dr. Mechele Collin is Medical Director for Pulmonary Rehab at [facility].

## 2023-01-20 ENCOUNTER — Encounter (HOSPITAL_COMMUNITY)
Admission: RE | Admit: 2023-01-20 | Discharge: 2023-01-20 | Disposition: A | Discharge status: Home / Self Care | Payer: Medicare Other | Source: Ambulatory Visit | Attending: Pulmonary Disease | Admitting: Pulmonary Disease

## 2023-01-20 DIAGNOSIS — J849 Interstitial pulmonary disease, unspecified: Secondary | ICD-10-CM | POA: Diagnosis not present

## 2023-01-20 NOTE — Progress Notes (Signed)
Daily Session Note  Patient Details  Name: Charles Wright MRN: [id number] Date of Birth: [birth year] Referring Provider:   Doristine Devoid Pulmonary Rehab Walk Test from 11/29/2022 in [facility], Vascular, [facility]  Referring Provider Mannam       Encounter Date: 01/20/2023  Check In:  Session Check In - 01/20/23 1326       Check-In   Supervising physician immediately available to respond to emergencies [facility] MD immediately available    Physician(s) Joni Reining, NP    Location [facility]    Staff Present Raford Pitcher, MS, ACSM-CEP, Exercise Physiologist;Rayan Ines Dionisio Paschal, ACSM-CEP, Exercise Physiologist;Mary Gerre Scull, RN, Fuller Plan, RT    Virtual Visit No    Medication changes reported     No    Fall or balance concerns reported    No    Tobacco Cessation No Change    Warm-up and Cool-down Performed as group-led instruction    Resistance Training Performed Yes    VAD Patient? No    PAD/SET Patient? No      Pain Assessment   Currently in Pain? No/denies    Multiple Pain Sites No             Capillary Blood Glucose: No results found for this or any previous visit (from the past 24 hours).    Social History   Tobacco Use  Smoking Status Never  Smokeless Tobacco Never    Goals Met:  Independence with exercise equipment Exercise tolerated well No report of concerns or symptoms today Strength training completed today  Goals Unmet:  Not Applicable  Comments: Service time is from 1312 to 1434.    Dr. Mechele Collin is Medical Director for Pulmonary Rehab at [facility].

## 2023-01-21 DIAGNOSIS — C44319 Basal cell carcinoma of skin of other parts of face: Secondary | ICD-10-CM | POA: Diagnosis not present

## 2023-01-25 ENCOUNTER — Encounter (HOSPITAL_COMMUNITY)
Admission: RE | Admit: 2023-01-25 | Discharge: 2023-01-25 | Disposition: A | Discharge status: Home / Self Care | Payer: Medicare Other | Source: Ambulatory Visit | Attending: Pulmonary Disease | Admitting: Pulmonary Disease

## 2023-01-25 DIAGNOSIS — J849 Interstitial pulmonary disease, unspecified: Secondary | ICD-10-CM

## 2023-01-25 NOTE — Progress Notes (Signed)
Daily Session Note  Patient Details  Name: Charles Wright MRN: [id number] Date of Birth: 20-Nov-1935 Referring Provider:   Doristine Devoid Pulmonary Rehab Walk Test from 11/29/2022 in [facility], Vascular, & [facility]  Referring Provider Mannam       Encounter Date: 01/25/2023  Check In:  Session Check In - 01/25/23 1324       Check-In   Supervising physician immediately available to respond to emergencies [facility] MD immediately available    Physician(s) Bernadene Person, NP    Location [facility]    Staff Present Raford Pitcher, MS, ACSM-CEP, Exercise Physiologist;Randi Dionisio Paschal, ACSM-CEP, Exercise Physiologist;Mary Gerre Scull, RN, Fuller Plan, RT    Virtual Visit No    Medication changes reported     No    Fall or balance concerns reported    No    Tobacco Cessation No Change    Warm-up and Cool-down Performed as group-led instruction    Resistance Training Performed Yes    VAD Patient? No    PAD/SET Patient? No      Pain Assessment   Currently in Pain? No/denies    Multiple Pain Sites No             Capillary Blood Glucose: No results found for this or any previous visit (from the past 24 hours).    Social History   Tobacco Use  Smoking Status Never  Smokeless Tobacco Never    Goals Met:  Proper associated with RPD/PD & O2 Sat Independence with exercise equipment Exercise tolerated well No report of concerns or symptoms today Strength training completed today  Goals Unmet:  Not Applicable  Comments: Service time is from 1314 to 1438.    Dr. Mechele Collin is Medical Director for Pulmonary Rehab at [facility].

## 2023-01-27 ENCOUNTER — Encounter (HOSPITAL_COMMUNITY)
Admission: RE | Admit: 2023-01-27 | Discharge: 2023-01-27 | Disposition: A | Discharge status: Home / Self Care | Payer: Medicare Other | Source: Ambulatory Visit | Attending: Pulmonary Disease | Admitting: Pulmonary Disease

## 2023-01-27 DIAGNOSIS — J849 Interstitial pulmonary disease, unspecified: Secondary | ICD-10-CM | POA: Diagnosis not present

## 2023-01-27 NOTE — Progress Notes (Signed)
Daily Session Note  Patient Details  Name: Charles Wright MRN: [id number] Date of Birth: 1935/12/13 Referring Provider:   Doristine Devoid Pulmonary Rehab Walk Test from 11/29/2022 in [facility], [facility]  Referring Provider Mannam       Encounter Date: 01/27/2023  Check In:  Session Check In - 01/27/23 1335       Check-In   Supervising physician immediately available to respond to emergencies [facility] MD immediately available    Physician(s) Robin Searing, NP    Location [facility]    Staff Present Raford Pitcher, MS, ACSM-CEP, Exercise Physiologist;Randi Dionisio Paschal, ACSM-CEP, Exercise Physiologist;Mary Gerre Scull, RN, Fuller Plan, RT    Virtual Visit No    Medication changes reported     No    Fall or balance concerns reported    No    Tobacco Cessation No Change    Warm-up and Cool-down Performed as group-led instruction    Resistance Training Performed Yes    VAD Patient? No    PAD/SET Patient? No      Pain Assessment   Currently in Pain? No/denies    Multiple Pain Sites No             Capillary Blood Glucose: No results found for this or any previous visit (from the past 24 hours).    Social History   Tobacco Use  Smoking Status Never  Smokeless Tobacco Never    Goals Met:  Proper associated with RPD/PD & O2 Sat Independence with exercise equipment Exercise tolerated well No report of concerns or symptoms today Strength training completed today  Goals Unmet:  Not Applicable  Comments: Service time is from 1310 to 1440.    Dr. Mechele Collin is Medical Director for Pulmonary Rehab at [facility].

## 2023-02-01 ENCOUNTER — Encounter (HOSPITAL_COMMUNITY)
Admission: RE | Admit: 2023-02-01 | Discharge: 2023-02-01 | Disposition: A | Discharge status: Home / Self Care | Payer: Medicare Other | Source: Ambulatory Visit | Attending: Pulmonary Disease

## 2023-02-01 VITALS — Wt 188.5 lb

## 2023-02-01 DIAGNOSIS — J849 Interstitial pulmonary disease, unspecified: Secondary | ICD-10-CM

## 2023-02-01 NOTE — Progress Notes (Signed)
Pulmonary Individual Treatment Plan  Patient Details  Name: Charles Wright MRN: [id number] Date of Birth: 1935-02-12 Referring Provider:   Doristine Devoid Pulmonary Rehab Walk Test from 11/29/2022 in [facility]  Referring Provider Mannam       Initial Encounter Date:  Flowsheet Row Pulmonary Rehab Walk Test from 11/29/2022 in [facility] for Heart, [facility]ascular, [facility]  Date 11/29/22       Visit Diagnosis: Interstitial lung disease (HCC)  Patient's Home Medications on Admission:   Current Outpatient Medications:    acetaminophen (TYLENOL) 500 MG tablet, Take 1,000 mg by mouth every 6 (six) hours as needed for moderate pain or headache., Disp: , Rfl:    allopurinol (ZYLOPRIM) 300 MG tablet, Take 300 mg by mouth every morning. , Disp: , Rfl:    apixaban (ELIQUIS) 5 MG TABS tablet, Take 1 tablet (5 mg total) by mouth 2 (two) times daily., Disp: 60 tablet, Rfl: 11   aspirin EC 81 MG tablet, Take 81 mg by mouth every evening., Disp: , Rfl:    Cholecalciferol (VITAMIN D-3) 125 MCG (5000 UT) TABS, Take 5,000 Units by mouth daily., Disp: , Rfl:    doxycycline (MONODOX) 50 MG capsule, Take 50 mg by mouth daily., Disp: , Rfl:    EPINEPHrine 0.3 mg/0.3 mL IJ SOAJ injection, Inject 0.3 mLs (0.3 mg total) into the muscle once. (Patient taking differently: Inject 0.3 mg into the muscle as needed for anaphylaxis.), Disp: 2 Device, Rfl: 1   GLUCOSAMINE SULFATE PO, Take 750 mg by mouth daily., Disp: , Rfl:    ipratropium (ATROVENT) 0.03 % nasal spray, Place 1 spray into both nostrils 2 (two) times daily., Disp: , Rfl:    levothyroxine (SYNTHROID, LEVOTHROID) 50 MCG tablet, Take 50 mcg by mouth daily before breakfast., Disp: , Rfl:    metoprolol tartrate (LOPRESSOR) 25 MG tablet, Take 1 tablet (25 mg total) by mouth 2 (two) times daily., Disp: 180 tablet, Rfl: 2   nitroGLYCERIN (NITROSTAT) 0.4 MG SL tablet, Place 1  tablet (0.4 mg total) under the tongue every 5 (five) minutes x 3 doses as needed for chest pain., Disp: 25 tablet, Rfl: 12   rosuvastatin (CRESTOR) 20 MG tablet, TAKE ONE TABLET BY MOUTH DAILY, Disp: 90 tablet, Rfl: 3   Testosterone 1.62 % GEL, Apply 2 Pump topically every other day. Applied to shoulder area, Disp: , Rfl:   Past Medical History: Past Medical History:  Diagnosis Date   Acute gout of left ankle    06-14-2015   Bladder cancer (HCC)    BCG's tx's   BPH (benign prostatic hypertrophy)    CAD (coronary artery disease) CARDIOLOGIST-  DR TILLEY   a. NSTEMI 12/2014 -  99% dLAD-2 (not amenable to PCI), 50% dLAD-1, 60% mLAD. Medical therapy was recommended.   Cancer of kidney (HCC)    left   Cough    HARSH NONPRODUCTIVE COUGH 1 AND 1/2 WEEKS   GERD (gastroesophageal reflux disease)    takes  OTC periodically   Gilbert's syndrome    H/O cardiac catheterization    a. Note: Difficult radial access in 12/2014 - recommend femoral approach if cath needed in the future.   History of kidney stones    History of non-ST elevation myocardial infarction (NSTEMI)    12-12-2014   CARDIAC CATH W/ NO INTERVENTION X 2FEW DAYS APART   History of spinal fracture 2002   LUMBAR  Hyperlipidemia    Hypertension    Hypothyroidism    Myocardial infarction (HCC)    OSA on CPAP    SET ON 7 USES SOME NIGHTS   Paroxysmal A-fib [facility]    cardiologist-  dr Donnie Aho   Prostate cancer [facility]) 2019   last PSA  2.9  (montiored by urologist  dr Mena Goes) Vanessa Kick AND FEB 2019 RAD DONE   Shingles    2 weeks ago   Wears glasses     Tobacco Use: Social History   Tobacco Use  Smoking Status Never  Smokeless Tobacco Never    Labs: Review Flowsheet  More data exists      Latest Ref Rng & Units 12/12/2014 10/10/2018 04/08/2019 05/14/2021 09/01/2021  Labs for ITP Cardiac and Pulmonary Rehab  Cholestrol 100 - 199 mg/dL 578  469  - - 629   LDL (calc) 0 - 99 mg/dL 528  413  - - 55   Direct LDL 0 - 99 mg/dL - - -  244  -  HDL-C >01 mg/dL 45  42  - - 38   Trlycerides 0 - 149 mg/dL 88  027  - - 90   Bicarbonate 20.0 - 28.0 mmol/L - - 24.6  - -  TCO2 22 - 32 mmol/L - - 26  - -  Acid-base deficit 0.0 - 2.0 mmol/L - - 2.0  - -  O2 Saturation % - - 99.0  - -    Capillary Blood Glucose: No results found for: "GLUCAP"   Pulmonary Assessment Scores:  Pulmonary Assessment Scores     Row Name 11/29/22 0841         ADL UCSD   ADL Phase Entry     SOB Score total 22       CAT Score   CAT Score 18       mMRC Score   mMRC Score 1             UCSD: Self-administered rating of dyspnea associated with activities of daily living (ADLs) 6-point scale (0 = "not at all" to 5 = "maximal or unable to do because of breathlessness")  Scoring Scores range from 0 to 120.  Minimally important difference is 5 units  CAT: CAT can identify the health impairment of COPD patients and is better correlated with disease progression.  CAT has a scoring range of zero to 40. The CAT score is classified into four groups of low (less than 10), medium (10 - 20), high (21-30) and very high (31-40) based on the impact level of disease on health status. A CAT score over 10 suggests significant symptoms.  A worsening CAT score could be explained by an exacerbation, poor medication adherence, poor inhaler technique, or progression of COPD or comorbid conditions.  CAT MCID is 2 points  mMRC: mMRC (Modified Medical Research Council) Dyspnea Scale is used to assess the degree of baseline functional disability in patients of respiratory disease due to dyspnea. No minimal important difference is established. A decrease in score of 1 point or greater is considered a positive change.   Pulmonary Function Assessment:  Pulmonary Function Assessment - 11/29/22 0832       Breath   Bilateral Breath Sounds Clear    Shortness of Breath Yes;Limiting activity             Exercise Target Goals: Exercise Program  Goal: Individual exercise prescription set using results from initial 6 min walk test and THRR while considering  patient's activity  barriers and safety.   Exercise Prescription Goal: Initial exercise prescription builds to 30-45 minutes a day of aerobic activity, 2-3 days per week.  Home exercise guidelines will be given to patient during program as part of exercise prescription that the participant will acknowledge.  Activity Barriers & Risk Stratification:  Activity Barriers & Cardiac Risk Stratification - 11/29/22 0833       Activity Barriers & Cardiac Risk Stratification   Activity Barriers Neck/Spine Problems;Deconditioning;Muscular Weakness;Shortness of Breath;Joint Problems;Back Problems             6 Minute Walk:  6 Minute Walk     Row Name 11/29/22 0956         6 Minute Walk   Phase Initial     Distance 1250 feet     Walk Time 6 minutes     # of Rest Breaks 0     MPH 2.37     METS 1.67     RPE 9     Perceived Dyspnea  1     VO2 Peak 5.85     Symptoms No     Resting HR 79 bpm     Resting BP 104/60     Resting Oxygen Saturation  95 %     Exercise Oxygen Saturation  during 6 min walk 91 %     Max Ex. HR 93 bpm     Max Ex. BP 110/60     2 Minute Post BP 118/68  taken after H2O given       Interval HR   1 Minute HR 81     2 Minute HR 87     3 Minute HR 90     4 Minute HR 90     5 Minute HR 93     6 Minute HR 78     2 Minute Post HR 77     Interval Heart Rate? Yes       Interval Oxygen   Interval Oxygen? Yes     Baseline Oxygen Saturation % 95 %     1 Minute Oxygen Saturation % 95 %     1 Minute Liters of Oxygen 0 L     2 Minute Oxygen Saturation % 92 %     2 Minute Liters of Oxygen 0 L     3 Minute Oxygen Saturation % 92 %     3 Minute Liters of Oxygen 0 L     4 Minute Oxygen Saturation % 91 %     4 Minute Liters of Oxygen 0 L     5 Minute Oxygen Saturation % 92 %     5 Minute Liters of Oxygen 0 L     6 Minute Oxygen Saturation % 93 %     6  Minute Liters of Oxygen 0 L     2 Minute Post Oxygen Saturation % 98 %     2 Minute Post Liters of Oxygen 0 L              Oxygen Initial Assessment:  Oxygen Initial Assessment - 11/29/22 0832       Home Oxygen   Home Oxygen Device None    Sleep Oxygen Prescription CPAP    Home Exercise Oxygen Prescription None    Home Resting Oxygen Prescription None    Compliance with Home Oxygen Use No      Initial 6 min Walk   Oxygen Used None      Program Oxygen  Prescription   Program Oxygen Prescription None      Intervention   Short Term Goals To learn and exhibit compliance with exercise, home and travel O2 prescription;To learn and understand importance of maintaining oxygen saturations>88%;To learn and demonstrate proper use of respiratory medications;To learn and understand importance of monitoring SPO2 with pulse oximeter and demonstrate accurate use of the pulse oximeter.;To learn and demonstrate proper pursed lip breathing techniques or other breathing techniques.     Long  Term Goals Exhibits compliance with exercise, home  and travel O2 prescription;Verbalizes importance of monitoring SPO2 with pulse oximeter and return demonstration;Maintenance of O2 saturations>88%;Exhibits proper breathing techniques, such as pursed lip breathing or other method taught during program session;Compliance with respiratory medication;Demonstrates proper use of MDI's             Oxygen Re-Evaluation:  Oxygen Re-Evaluation     Row Name 11/29/22 1509 12/28/22 0837 01/20/23 0923         Program Oxygen Prescription   Program Oxygen Prescription None None None       Home Oxygen   Home Oxygen Device None None None     Sleep Oxygen Prescription CPAP CPAP CPAP     Home Exercise Oxygen Prescription None None None     Home Resting Oxygen Prescription None None None     Compliance with Home Oxygen Use No No No       Goals/Expected Outcomes   Short Term Goals To learn and exhibit compliance  with exercise, home and travel O2 prescription;To learn and understand importance of maintaining oxygen saturations>88%;To learn and demonstrate proper use of respiratory medications;To learn and understand importance of monitoring SPO2 with pulse oximeter and demonstrate accurate use of the pulse oximeter.;To learn and demonstrate proper pursed lip breathing techniques or other breathing techniques.  To learn and exhibit compliance with exercise, home and travel O2 prescription;To learn and understand importance of maintaining oxygen saturations>88%;To learn and demonstrate proper use of respiratory medications;To learn and understand importance of monitoring SPO2 with pulse oximeter and demonstrate accurate use of the pulse oximeter.;To learn and demonstrate proper pursed lip breathing techniques or other breathing techniques.  To learn and exhibit compliance with exercise, home and travel O2 prescription;To learn and understand importance of maintaining oxygen saturations>88%;To learn and demonstrate proper use of respiratory medications;To learn and understand importance of monitoring SPO2 with pulse oximeter and demonstrate accurate use of the pulse oximeter.;To learn and demonstrate proper pursed lip breathing techniques or other breathing techniques.      Long  Term Goals Exhibits compliance with exercise, home  and travel O2 prescription;Verbalizes importance of monitoring SPO2 with pulse oximeter and return demonstration;Maintenance of O2 saturations>88%;Exhibits proper breathing techniques, such as pursed lip breathing or other method taught during program session;Compliance with respiratory medication;Demonstrates proper use of MDI's Exhibits compliance with exercise, home  and travel O2 prescription;Verbalizes importance of monitoring SPO2 with pulse oximeter and return demonstration;Maintenance of O2 saturations>88%;Exhibits proper breathing techniques, such as pursed lip breathing or other method  taught during program session;Compliance with respiratory medication;Demonstrates proper use of MDI's Exhibits compliance with exercise, home  and travel O2 prescription;Verbalizes importance of monitoring SPO2 with pulse oximeter and return demonstration;Maintenance of O2 saturations>88%;Exhibits proper breathing techniques, such as pursed lip breathing or other method taught during program session;Compliance with respiratory medication;Demonstrates proper use of MDI's     Comments -- -- SpO2 is consistently borderline 88-90 RA. We discussed possible need for O2 with increased workloads     Goals/Expected Outcomes  Compliance and understanding of oxygen saturation monitoring and breathing techniques to decrease shortness of breath Compliance and understanding of oxygen saturation monitoring and breathing techniques to decrease shortness of breath Compliance and understanding of oxygen saturation monitoring and breathing techniques to decrease shortness of breath              Oxygen Discharge (Final Oxygen Re-Evaluation):  Oxygen Re-Evaluation - 01/20/23 0923       Program Oxygen Prescription   Program Oxygen Prescription None      Home Oxygen   Home Oxygen Device None    Sleep Oxygen Prescription CPAP    Home Exercise Oxygen Prescription None    Home Resting Oxygen Prescription None    Compliance with Home Oxygen Use No      Goals/Expected Outcomes   Short Term Goals To learn and exhibit compliance with exercise, home and travel O2 prescription;To learn and understand importance of maintaining oxygen saturations>88%;To learn and demonstrate proper use of respiratory medications;To learn and understand importance of monitoring SPO2 with pulse oximeter and demonstrate accurate use of the pulse oximeter.;To learn and demonstrate proper pursed lip breathing techniques or other breathing techniques.     Long  Term Goals Exhibits compliance with exercise, home  and travel O2  prescription;Verbalizes importance of monitoring SPO2 with pulse oximeter and return demonstration;Maintenance of O2 saturations>88%;Exhibits proper breathing techniques, such as pursed lip breathing or other method taught during program session;Compliance with respiratory medication;Demonstrates proper use of MDI's    Comments SpO2 is consistently borderline 88-90 RA. We discussed possible need for O2 with increased workloads    Goals/Expected Outcomes Compliance and understanding of oxygen saturation monitoring and breathing techniques to decrease shortness of breath             Initial Exercise Prescription:  Initial Exercise Prescription - 11/29/22 0900       Date of Initial Exercise RX and Referring Provider   Date 11/29/22    Referring Provider Mannam    Expected Discharge Date 02/23/22      Recumbant Elliptical   Level 2    RPM 55    Watts 80    Minutes 15      Track   Minutes 15    METs 2      Prescription Details   Frequency (times per week) 2    Duration Progress to 30 minutes of continuous aerobic without signs/symptoms of physical distress      Intensity   THRR 40-80% of Max Heartrate 54-108    Ratings of Perceived Exertion 11-13    Perceived Dyspnea 0-4      Progression   Progression Continue to progress workloads to maintain intensity without signs/symptoms of physical distress.      Resistance Training   Training Prescription Yes    Weight blue bands             Perform Capillary Blood Glucose checks as needed.  Exercise Prescription Changes:   Exercise Prescription Changes     Row Name 12/07/22 1500 12/21/22 1500 01/04/23 1300 01/18/23 1400       Response to Exercise   Blood Pressure (Admit) 104/58 120/62 106/60 104/66    Blood Pressure (Exercise) 120/60 146/72 166/80 148/60    Blood Pressure (Exit) 94/52 102/60 114/60 112/70    Heart Rate (Admit) 71 bpm 77 bpm 85 bpm 85 bpm    Heart Rate (Exercise) 112 bpm 125 bpm 137 bpm  Had pt  decrease speed d/t exceeding HR max 123  bpm    Heart Rate (Exit) 81 bpm 90 bpm 93 bpm 97 bpm    Oxygen Saturation (Admit) 97 % 94 % 95 % 94 %    Oxygen Saturation (Exercise) 90 % 90 % 88 % 88 %    Oxygen Saturation (Exit) 95 % 95 % 93 % 94 %    Rating of Perceived Exertion (Exercise) 9 11 13 13     Perceived Dyspnea (Exercise) 1 1 3 3     Duration Continue with 30 min of aerobic exercise without signs/symptoms of physical distress. Continue with 30 min of aerobic exercise without signs/symptoms of physical distress. Continue with 30 min of aerobic exercise without signs/symptoms of physical distress. Continue with 30 min of aerobic exercise without signs/symptoms of physical distress.    Intensity THRR unchanged THRR unchanged THRR unchanged THRR New  54-145      Progression   Progression Continue to progress workloads to maintain intensity without signs/symptoms of physical distress. Continue to progress workloads to maintain intensity without signs/symptoms of physical distress. Continue to progress workloads to maintain intensity without signs/symptoms of physical distress. Continue to progress workloads to maintain intensity without signs/symptoms of physical distress.    Average METs -- -- -- 4.7      Resistance Training   Training Prescription Yes Yes Yes Yes    Weight blue bands blue bands black bands black bands    Reps 10-15 10-15 10-15 10-15    Time 10 Minutes 10 Minutes 10 Minutes 10 Minutes      Treadmill   MPH -- 2.6 2.7 2.7    Grade -- 0 0 1.5    Minutes -- 15 15 15     METs -- 2.99 2.9 3.63      Recumbant Elliptical   Level 2 6 7  --    RPM 60 -- 61 --    Watts -- -- 105 --    Minutes 15 15 15  --    METs 3.2 4.8 4.5 --      Elliptical   Level -- -- -- 1    Speed -- -- -- 1    Minutes -- -- -- 15    METs -- -- -- 4.6      Track   Laps 12 -- -- --    Minutes 15 -- -- --    METs 2.85 -- -- --      Home Exercise Plan   Plans to continue exercise at -- -- -- Home  (comment)  walking    Frequency -- -- -- Add 1 additional day to program exercise sessions.    Initial Home Exercises Provided -- -- -- 01/18/23             Exercise Comments:   Exercise Comments     Row Name 12/07/22 1508 01/18/23 1503         Exercise Comments Pt completed first day of exercise. Jameel exercised for 15 min on recumbent elliptical and track. Kerwin averaged 3.2 METs at level 2 on the recumbent elliptical and 2.85 METs on the track. He performed the warmup and cooldown standing without limitations. Discussed METs. Pt voiced understanding. Discussed with pt home exercise plan. He is active in his yard and walks slowly with his dog but not doing formal exercise at home. Discussed walking 30 min atleast 1 nonrehab day. He agreed. He also plans to join the gym in January.               Exercise  Goals and Review:   Exercise Goals     Row Name 11/29/22 [phone]             Exercise Goals   Increase Physical Activity Yes       Intervention Provide advice, education, support and counseling about physical activity/exercise needs.;Develop an individualized exercise prescription for aerobic and resistive training based on initial evaluation findings, risk stratification, comorbidities and participant's personal goals.       Expected Outcomes Short Term: Attend rehab on a regular basis to increase amount of physical activity.;Long Term: Add in home exercise to make exercise part of routine and to increase amount of physical activity.;Long Term: Exercising regularly at least 3-5 days a week.       Increase Strength and Stamina Yes       Intervention Provide advice, education, support and counseling about physical activity/exercise needs.;Develop an individualized exercise prescription for aerobic and resistive training based on initial evaluation findings, risk stratification, comorbidities and participant's personal goals.       Expected Outcomes Short Term: Increase workloads  from initial exercise prescription for resistance, speed, and METs.;Short Term: Perform resistance training exercises routinely during rehab and add in resistance training at home;Long Term: Improve cardiorespiratory fitness, muscular endurance and strength as measured by increased METs and functional capacity ( )       Able to understand and use rate of perceived exertion (RPE) scale Yes       Intervention Provide education and explanation on how to use RPE scale       Expected Outcomes Short Term: Able to use RPE daily in rehab to express subjective intensity level;Long Term:  Able to use RPE to guide intensity level when exercising independently       Able to understand and use Dyspnea scale Yes       Intervention Provide education and explanation on how to use Dyspnea scale       Expected Outcomes Short Term: Able to use Dyspnea scale daily in rehab to express subjective sense of shortness of breath during exertion;Long Term: Able to use Dyspnea scale to guide intensity level when exercising independently       Knowledge and understanding of Target Heart Rate Range (THRR) Yes       Intervention Provide education and explanation of THRR including how the numbers were predicted and where they are located for reference       Expected Outcomes Short Term: Able to state/look up THRR;Long Term: Able to use THRR to govern intensity when exercising independently;Short Term: Able to use daily as guideline for intensity in rehab       Understanding of Exercise Prescription Yes       Intervention Provide education, explanation, and written materials on patient's individual exercise prescription       Expected Outcomes Short Term: Able to explain program exercise prescription;Long Term: Able to explain home exercise prescription to exercise independently                Exercise Goals Re-Evaluation :  Exercise Goals Re-Evaluation     Row Name 11/29/22 1508 12/28/22 0832 01/20/23 0919          Exercise Goal Re-Evaluation   Exercise Goals Review Increase Physical Activity;Able to understand and use Dyspnea scale;Understanding of Exercise Prescription;Increase Strength and Stamina;Knowledge and understanding of Target Heart Rate Range (THRR);Able to understand and use rate of perceived exertion (RPE) scale Increase Physical Activity;Able to understand and use Dyspnea scale;Understanding of Exercise Prescription;Increase Strength and  Stamina;Knowledge and understanding of Target Heart Rate Range (THRR);Able to understand and use rate of perceived exertion (RPE) scale Increase Physical Activity;Able to understand and use Dyspnea scale;Understanding of Exercise Prescription;Increase Strength and Stamina;Knowledge and understanding of Target Heart Rate Range (THRR);Able to understand and use rate of perceived exertion (RPE) scale     Comments Pt is scheduled to begin group exercise 10/29. Will monitor for progression. Pt has completed 6 exercise sessions with perfect attendance. Nikalas is exercising for 15 min on recumbent elliptical and treadmill. Chrishon averages 5.1 METs at level 6 on the recumbent elliptical. He is walking on the treadmill at 2.7 mph, METs 2.9. He is very motivated. He performed the warmup and cooldown standing without limitations.  Will monitor for progression. Pt has completed 11 exercise sessions with perfect attendance. Chas is exercising for 15 min on upright elliptical and treadmill. He recently moved to the upright elliptical for more progression, speed 1, incline 1 %, METs 4.6.  He then is walking on the treadmill at 2.7 mph, incline 1.5, METs 3.6. He is very motivated and has voiced that he would like to try to jog (former [employer]). We will try intervals on the treadmill. He is using black bands with resistance training. Will monitor for progression.     Expected Outcomes Through exercise at rehab and at home, the patient will decrease shortness of breath with daily activities and  feel confident in carrying out an exercise regime at home. Through exercise at rehab and at home, the patient will decrease shortness of breath with daily activities and feel confident in carrying out an exercise regime at home. Through exercise at rehab and at home, the patient will decrease shortness of breath with daily activities and feel confident in carrying out an exercise regime at home.              Discharge Exercise Prescription (Final Exercise Prescription Changes):  Exercise Prescription Changes - 01/18/23 1400       Response to Exercise   Blood Pressure (Admit) 104/66    Blood Pressure (Exercise) 148/60    Blood Pressure (Exit) 112/70    Heart Rate (Admit) 85 bpm    Heart Rate (Exercise) 123 bpm    Heart Rate (Exit) 97 bpm    Oxygen Saturation (Admit) 94 %    Oxygen Saturation (Exercise) 88 %    Oxygen Saturation (Exit) 94 %    Rating of Perceived Exertion (Exercise) 13    Perceived Dyspnea (Exercise) 3    Duration Continue with 30 min of aerobic exercise without signs/symptoms of physical distress.    Intensity THRR New   54-145     Progression   Progression Continue to progress workloads to maintain intensity without signs/symptoms of physical distress.    Average METs 4.7      Resistance Training   Training Prescription Yes    Weight black bands    Reps 10-15    Time 10 Minutes      Treadmill   MPH 2.7    Grade 1.5    Minutes 15    METs 3.63      Elliptical   Level 1    Speed 1    Minutes 15    METs 4.6      Home Exercise Plan   Plans to continue exercise at Home (comment)   walking   Frequency Add 1 additional day to program exercise sessions.    Initial Home Exercises Provided 01/18/23  Nutrition:  Target Goals: Understanding of nutrition guidelines, daily intake of sodium 1500mg , cholesterol 200mg , calories 30% from fat and 7% or less from saturated fats, daily to have 5 or more servings of fruits and  vegetables.  Biometrics:  Pre Biometrics - 11/29/22 0828       Pre Biometrics   Grip Strength 30 kg              Nutrition Therapy Plan and Nutrition Goals:  Nutrition Therapy & Goals - 01/04/23 1529       Nutrition Therapy   Diet Heart Healthy Diet    Drug/Food Interactions Statins/Certain Fruits      Personal Nutrition Goals   Nutrition Goal Patient to improve diet quality by using the plate method as a guide for meal planning to include lean protein/plant protein, fruits, vegetables, whole grains, nonfat dairy as part of a well-balanced diet.   goal in progress.   Comments Husayn has medical history of NSTEMI, hyperlipidemia, sleep apnea, hx of TIA, ILD, afib ablation july 2024. His LDL is at goal. He reports no nutrition concerns/goals at this time. He reports eating a wide variety of foods, two meals daily, and stable appetite. He has maintained his weight since starting with our program.His wife is a good support.  Patient will benefit from participation in pulmonary rehab for nutrition, exercise, and lifestyle modification.      Intervention Plan   Intervention Prescribe, educate and counsel regarding individualized specific dietary modifications aiming towards targeted core components such as weight, hypertension, lipid management, diabetes, heart failure and other comorbidities.;Nutrition handout(s) given to patient.    Expected Outcomes Short Term Goal: Understand basic principles of dietary content, such as calories, fat, sodium, cholesterol and nutrients.;Long Term Goal: Adherence to prescribed nutrition plan.             Nutrition Assessments:  Nutrition Assessments - 12/07/22 1533       Rate Your Plate Scores   Pre Score 49            MEDIFICTS Score Key: >=70 Need to make dietary changes  40-70 Heart Healthy Diet <= 40 Therapeutic Level Cholesterol Diet  Flowsheet Row PULMONARY REHAB OTHER RESPIRATORY from 12/07/2022 in [facility]  Picture Your Plate Total Score on Admission 49      Picture Your Plate Scores: <95 Unhealthy dietary pattern with much room for improvement. 41-50 Dietary pattern unlikely to meet recommendations for good health and room for improvement. 51-60 More healthful dietary pattern, with some room for improvement.  >60 Healthy dietary pattern, although there may be some specific behaviors that could be improved.    Nutrition Goals Re-Evaluation:  Nutrition Goals Re-Evaluation     Row Name 12/07/22 1343 01/04/23 1529           Goals   Current Weight 191 lb 5.8 oz (86.8 kg) 190 lb 7.6 oz (86.4 kg)      Comment LDL 55, HDL 38, GFR 44, HDL 38 no new labs; most recent labs  LDL 55, HDL 38, GFR 44, HDL 38      Expected Outcome Justus has medical history of NSTEMI, hyperlipidemia, sleep apnea, hx of TIA, ILD, afib ablation july 2024. His LDL is at goal. He reports no nutrition concerns/goals at this time. He reports eating a wide variety of foods, two meals daily, and stable appetite. His wife is a good support. Patient will benefit from participation in pulmonary rehab  for nutrition, exercise, and lifestyle modification. Happy has medical history of NSTEMI, hyperlipidemia, sleep apnea, hx of TIA, ILD, afib ablation july 2024. His LDL is at goal. He reports no nutrition concerns/goals at this time. He reports eating a wide variety of foods, two meals daily, and stable appetite. He has maintained his weight since starting with our program.His wife is a good support. Patient will benefit from participation in pulmonary rehab for nutrition, exercise, and lifestyle modification.               Nutrition Goals Discharge (Final Nutrition Goals Re-Evaluation):  Nutrition Goals Re-Evaluation - 01/04/23 1529       Goals   Current Weight 190 lb 7.6 oz (86.4 kg)    Comment no new labs; most recent labs  LDL 55, HDL 38, GFR 44, HDL 38    Expected Outcome Margie has  medical history of NSTEMI, hyperlipidemia, sleep apnea, hx of TIA, ILD, afib ablation july 2024. His LDL is at goal. He reports no nutrition concerns/goals at this time. He reports eating a wide variety of foods, two meals daily, and stable appetite. He has maintained his weight since starting with our program.His wife is a good support. Patient will benefit from participation in pulmonary rehab for nutrition, exercise, and lifestyle modification.             Psychosocial: Target Goals: Acknowledge presence or absence of significant depression and/or stress, maximize coping skills, provide positive support system. Participant is able to verbalize types and ability to use techniques and skills needed for reducing stress and depression.  Initial Review & Psychosocial Screening:  Initial Psych Review & Screening - 11/29/22 0834       Initial Review   Current issues with None Identified      Family Dynamics   Good Support System? Yes    Comments spouse      Barriers   Psychosocial barriers to participate in program There are no identifiable barriers or psychosocial needs.      Screening Interventions   Interventions Encouraged to exercise             Quality of Life Scores:  Scores of 19 and below usually indicate a poorer quality of life in these areas.  A difference of  2-3 points is a clinically meaningful difference.  A difference of 2-3 points in the total score of the Quality of Life Index has been associated with significant improvement in overall quality of life, self-image, physical symptoms, and general health in studies assessing change in quality of life.  PHQ-9: Review Flowsheet  More data exists      11/29/2022 06/01/2017 05/04/2017 10/07/2016 12/24/2014  Depression screen PHQ 2/9  Decreased Interest 0 0 0 0 0  Down, Depressed, Hopeless 0 0 0 0 0  PHQ - 2 Score 0 0 0 0 0  Altered sleeping 0 - - - -  Tired, decreased energy 0 - - - -  Change in appetite 0 - - -  -  Feeling bad or failure about yourself  0 - - - -  Trouble concentrating 0 - - - -  Moving slowly or fidgety/restless 0 - - - -  Suicidal thoughts 0 - - - -  PHQ-9 Score 0 - - - -  Difficult doing work/chores Not difficult at all - - - -   Interpretation of Total Score  Total Score Depression Severity:  1-4 = Minimal depression, 5-9 = Mild depression, 10-14 = Moderate depression,  15-19 = Moderately severe depression, 20-27 = Severe depression   Psychosocial Evaluation and Intervention:  Psychosocial Evaluation - 11/29/22 0834       Psychosocial Evaluation & Interventions   Interventions Encouraged to exercise with the program and follow exercise prescription    Comments Pt denies any psychosocial needs at this time.    Expected Outcomes For Rube to participate in rehab free of psychosocial barriers.    Continue Psychosocial Services  No Follow up required             Psychosocial Re-Evaluation:  Psychosocial Re-Evaluation     Row Name 12/03/22 1018 12/31/22 0955 01/24/23 1220         Psychosocial Re-Evaluation   Current issues with None Identified None Identified None Identified     Comments No changes in prior assessment. Courtland is scheduled to start the program on 12/07/22. No changes in monthly re-assessment. Dantonio denies any psychosocial needs at this time. He states he has great support from his wife and family. No changes in monthly re-assessment. Tilton denies any psychosocial needs at this time. He states he has great support from his wife and family.     Expected Outcomes For Shirl to participate in rehab free of psychosocial barriers. For Naveen to participate in rehab free of psychosocial barriers. For Giomar to participate in rehab free of psychosocial barriers or concerns     Interventions Encouraged to attend Pulmonary Rehabilitation for the exercise Encouraged to attend Pulmonary Rehabilitation for the exercise Encouraged to attend Pulmonary Rehabilitation for the  exercise     Continue Psychosocial Services  No Follow up required No Follow up required No Follow up required              Psychosocial Discharge (Final Psychosocial Re-Evaluation):  Psychosocial Re-Evaluation - 01/24/23 1220       Psychosocial Re-Evaluation   Current issues with None Identified    Comments No changes in monthly re-assessment. Linkon denies any psychosocial needs at this time. He states he has great support from his wife and family.    Expected Outcomes For Leobardo to participate in rehab free of psychosocial barriers or concerns    Interventions Encouraged to attend Pulmonary Rehabilitation for the exercise    Continue Psychosocial Services  No Follow up required             Education: Education Goals: Education classes will be provided on a weekly basis, covering required topics. Participant will state understanding/return demonstration of topics presented.  Learning Barriers/Preferences:  Learning Barriers/Preferences - 11/29/22 0835       Learning Barriers/Preferences   Learning Barriers Hearing    Learning Preferences Skilled Demonstration             Education Topics: Know Your Numbers Group instruction that is supported by a PowerPoint presentation. Instructor discusses importance of knowing and understanding resting, exercise, and post-exercise oxygen saturation, heart rate, and blood pressure. Oxygen saturation, heart rate, blood pressure, rating of perceived exertion, and dyspnea are reviewed along with a normal range for these values.    Exercise for the Pulmonary Patient Group instruction that is supported by a PowerPoint presentation. Instructor discusses benefits of exercise, core components of exercise, frequency, duration, and intensity of an exercise routine, importance of utilizing pulse oximetry during exercise, safety while exercising, and options of places to exercise outside of rehab.    MET Level  Group instruction provided by  PowerPoint, verbal discussion, and written material to support subject matter. Instructor reviews  what METs are and how to increase METs.    Pulmonary Medications Verbally interactive group education provided by instructor with focus on inhaled medications and proper administration. Flowsheet Row PULMONARY REHAB OTHER RESPIRATORY from 01/27/2023 in [facility]  Date 01/27/23  Educator RT  Instruction Review Code 1- Verbalizes Understanding       Anatomy and Physiology of the Respiratory System Group instruction provided by PowerPoint, verbal discussion, and written material to support subject matter. Instructor reviews respiratory cycle and anatomical components of the respiratory system and their functions. Instructor also reviews differences in obstructive and restrictive respiratory diseases with examples of each.  Flowsheet Row PULMONARY REHAB OTHER RESPIRATORY from 01/20/2023 in [facility]  Date 01/20/23  Educator RT  Instruction Review Code 1- Verbalizes Understanding       Oxygen Safety Group instruction provided by PowerPoint, verbal discussion, and written material to support subject matter. There is an overview of "What is Oxygen" and "Why do we need it".  Instructor also reviews how to create a safe environment for oxygen use, the importance of using oxygen as prescribed, and the risks of noncompliance. There is a brief discussion on traveling with oxygen and resources the patient may utilize.   Oxygen Use Group instruction provided by PowerPoint, verbal discussion, and written material to discuss how supplemental oxygen is prescribed and different types of oxygen supply systems. Resources for more information are provided.    Breathing Techniques Group instruction that is supported by demonstration and informational handouts. Instructor discusses the  benefits of pursed lip and diaphragmatic breathing and detailed demonstration on how to perform both.     Risk Factor Reduction Group instruction that is supported by a PowerPoint presentation. Instructor discusses the definition of a risk factor, different risk factors for pulmonary disease, and how the heart and lungs work together. Flowsheet Row PULMONARY REHAB OTHER RESPIRATORY from 12/23/2022 in [facility]  Date 12/23/22  Educator EP  Instruction Review Code 1- Verbalizes Understanding       Pulmonary Diseases Group instruction provided by PowerPoint, verbal discussion, and written material to support subject matter. Instructor gives an overview of the different type of pulmonary diseases. There is also a discussion on risk factors and symptoms as well as ways to manage the diseases. Flowsheet Row PULMONARY REHAB OTHER RESPIRATORY from 01/13/2023 in [facility]  Date 01/13/23  Educator RT  Instruction Review Code 1- Verbalizes Understanding       Stress and Energy Conservation Group instruction provided by PowerPoint, verbal discussion, and written material to support subject matter. Instructor gives an overview of stress and the impact it can have on the body. Instructor also reviews ways to reduce stress. There is also a discussion on energy conservation and ways to conserve energy throughout the day. Flowsheet Row PULMONARY REHAB OTHER RESPIRATORY from 12/09/2022 in [facility]  Date 12/09/22  Educator Rn  Instruction Review Code 1- Verbalizes Understanding       Warning Signs and Symptoms Group instruction provided by PowerPoint, verbal discussion, and written material to support subject matter. Instructor reviews warning signs and symptoms of stroke, heart attack, cold and flu. Instructor also  reviews ways to prevent the spread of infection. Flowsheet Row PULMONARY REHAB OTHER RESPIRATORY from 12/16/2022  in [facility]  Date 12/16/22  Educator RN  Instruction Review Code 1- Verbalizes Understanding       Other Education Group or individual verbal, written, or video instructions that support the educational goals of the pulmonary rehab program.    Knowledge Questionnaire Score:  Knowledge Questionnaire Score - 11/29/22 0836       Knowledge Questionnaire Score   Pre Score 16/18             Core Components/Risk Factors/Patient Goals at Admission:  Personal Goals and Risk Factors at Admission - 11/29/22 0835       Core Components/Risk Factors/Patient Goals on Admission    Weight Management Weight Loss;Yes    Intervention Weight Management: Develop a combined nutrition and exercise program designed to reach desired caloric intake, while maintaining appropriate intake of nutrient and fiber, sodium and fats, and appropriate energy expenditure required for the weight goal.;Weight Management: Provide education and appropriate resources to help participant work on and attain dietary goals.;Weight Management/Obesity: Establish reasonable short term and long term weight goals.;Obesity: Provide education and appropriate resources to help participant work on and attain dietary goals.    Expected Outcomes Short Term: Continue to assess and modify interventions until short term weight is achieved;Long Term: Adherence to nutrition and physical activity/exercise program aimed toward attainment of established weight goal;Weight Maintenance: Understanding of the daily nutrition guidelines, which includes 25-35% calories from fat, 7% or less cal from saturated fats, less than 200mg  cholesterol, less than 1.5gm of sodium, & 5 or more servings of fruits and vegetables daily;Weight Loss: Understanding of general recommendations for a  balanced deficit meal plan, which promotes 1-2 lb weight loss per week and includes a negative energy balance of [phone] kcal/d;Understanding of distribution of calorie intake throughout the day with the consumption of 4-5 meals/snacks;Understanding recommendations for meals to include 15-35% energy as protein, 25-35% energy from fat, 35-60% energy from carbohydrates, less than 200mg  of dietary cholesterol, 20-35 gm of total fiber daily    Improve shortness of breath with ADL's Yes    Intervention Provide education, individualized exercise plan and daily activity instruction to help decrease symptoms of SOB with activities of daily living.    Expected Outcomes Short Term: Improve cardiorespiratory fitness to achieve a reduction of symptoms when performing ADLs;Long Term: Be able to perform more ADLs without symptoms or delay the onset of symptoms             Core Components/Risk Factors/Patient Goals Review:   Goals and Risk Factor Review     Row Name 12/03/22 1019 12/31/22 0956 01/24/23 1221         Core Components/Risk Factors/Patient Goals Review   Personal Goals Review Weight Management/Obesity;Improve shortness of breath with ADL's;Develop more efficient breathing techniques such as purse lipped breathing and diaphragmatic breathing and practicing self-pacing with activity. Weight Management/Obesity;Improve shortness of breath with ADL's;Develop more efficient breathing techniques such as purse lipped breathing and diaphragmatic breathing and practicing self-pacing with activity. Weight Management/Obesity;Improve shortness of breath with ADL's;Develop more efficient breathing techniques such as purse lipped breathing and diaphragmatic breathing and practicing self-pacing with activity.     Review No changes from orientation. Rinaldo is scheduled to start the program on 12/07/22. Core components/risk factors/patient goals monthly re-evaluate are as follows: Esa has not met his goal to lose any  weight this month. He is up in weight from starting the program. He reports eating 2 meals a day and  eating a wide variety of foods. Lamario has been open to suggestions from our dietitian but hasn't implanted suggestions yet. We will continue to support Deniel in his goal to lose weight. Goal in progress on improving SOB with ADLs and develop more efficient breathing techniques such as purse lipped breathing and diaphragmatic breathing; and practicing self-pacing with activity. Jaethan is learning on how to initiate these techniques before becoming severely short of breath or tired. 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 Tupac has been quickly increasing both his workload and METs and has been exercising here with great motivation to get to where he once was. With supervision of staff, he has been making excellent progress and we will continue to monitor and evaluate. He will continue to benefit from participation in PR for nutrition, education, exercise, and lifestyle modification. Core components/risk factors/patient goals monthly re-evaluate are as follows: Micai has not met his goal to lose any weight this month. He has maintained his weight since starting the program. He reports eating 2 meals a day and eating a wide variety of foods. Zyrus has been open to suggestions from our dietitian but hasn't made any changes yet. We will continue to support Isami in his goal to lose weight. Goal in progress on improving SOB with ADLs. Travious can report his rate of perceived exertion and dyspnea level accurately. He has been able to maintain his oxygen saturation on room air. Goal met for developing more efficient breathing techniques such as purse lipped breathing and diaphragmatic breathing; and practicing self-pacing with activity. Donyel can initiate these techniques before becoming severely short of breath or tired. 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 Jewelz has been quickly increasing both his workload and METs and has  been exercising here with great motivation to get to where he once was. With supervision of staff, he has been making excellent progress and will start running on the treadmill this week. Bohden will continue to benefit from participation in PR for nutrition, education, exercise, and lifestyle modification.     Expected Outcomes For Camran to lose weight, improve his SOB with ADLs, and develop more efficient breathing techniques such as purse lipped breathing and diaphragmatic breathing; and practicing self-pacing with activity For Rush to lose weight, improve his SOB with ADLs, and develop more efficient breathing techniques such as purse lipped breathing and diaphragmatic breathing; and practicing self-pacing with activity For Nikos to lose weight and improve his SOB with ADLs              Core Components/Risk Factors/Patient Goals at Discharge (Final Review):   Goals and Risk Factor Review - 01/24/23 1221       Core Components/Risk Factors/Patient Goals Review   Personal Goals Review Weight Management/Obesity;Improve shortness of breath with ADL's;Develop more efficient breathing techniques such as purse lipped breathing and diaphragmatic breathing and practicing self-pacing with activity.    Review Core components/risk factors/patient goals monthly re-evaluate are as follows: Yoshiaki has not met his goal to lose any weight this month. He has maintained his weight since starting the program. He reports eating 2 meals a day and eating a wide variety of foods. Ethin has been open to suggestions from our dietitian but hasn't made any changes yet. We will continue to support Izaias in his goal to lose weight. Goal in progress on improving SOB with ADLs. San can report his rate of perceived exertion and dyspnea level accurately. He has been able to maintain his oxygen saturation  on room air. Goal met for developing more efficient breathing techniques such as purse lipped breathing and diaphragmatic breathing; and  practicing self-pacing with activity. Juanpablo can initiate these techniques before becoming severely short of breath or tired. Drako is an athlete who likes to push himself. Cyprian has been quickly increasing both his workload and METs and has been exercising here with great motivation to get to where he once was. With supervision of staff, he has been making excellent progress and will start running on the treadmill this week. Deandra will continue to benefit from participation in PR for nutrition, education, exercise, and lifestyle modification.    Expected Outcomes For Anav to lose weight and improve his SOB with ADLs             ITP Comments:Pt is making expected progress toward Pulmonary Rehab goals after completing 14 session(s). Recommend continued exercise, life style modification, education, and utilization of breathing techniques to increase stamina and strength, while also decreasing shortness of breath with exertion.  Dr. Mechele Collin is Medical Director for Pulmonary Rehab at [facility].

## 2023-02-01 NOTE — Progress Notes (Signed)
Daily Session Note  Patient Details  Name: Charles Wright MRN: [id number] Date of Birth: [birth year] Referring Provider:   Doristine Devoid Pulmonary Rehab Walk Test from 11/29/2022 in [facility] for Heart, Vascular, & Lung Health  Referring Provider Mannam       Encounter Date: 02/01/2023  Check In:  Session Check In - 02/01/23 4010       Check-In   Supervising physician immediately available to respond to emergencies [facility] MD immediately available    Physician(s) Edd Fabian, NP    Location [facility]    Staff Present Elissa Lovett BS, ACSM-CEP, Exercise Physiologist;Chantelle Verdi Gerre Scull, RN, Fuller Plan, RT    Virtual Visit No    Medication changes reported     No    Fall or balance concerns reported    No    Tobacco Cessation No Change    Warm-up and Cool-down Performed as group-led instruction    Resistance Training Performed Yes    VAD Patient? No    PAD/SET Patient? No      Pain Assessment   Currently in Pain? No/denies    Multiple Pain Sites No             Capillary Blood Glucose: No results found for this or any previous visit (from the past 24 hours).   Exercise Prescription Changes - 02/01/23 0900       Response to Exercise   Blood Pressure (Admit) 106/60    Blood Pressure (Exercise) 148/78    Blood Pressure (Exit) 108/62    Heart Rate (Admit) 67 bpm    Heart Rate (Exercise) 121 bpm    Heart Rate (Exit) 1.74 bpm    Oxygen Saturation (Admit) 98 %    Oxygen Saturation (Exercise) 88 %    Oxygen Saturation (Exit) 93 %    Rating of Perceived Exertion (Exercise) 13    Perceived Dyspnea (Exercise) 3    Duration Continue with 30 min of aerobic exercise without signs/symptoms of physical distress.    Intensity THRR New      Progression   Progression Continue to progress workloads to maintain intensity without signs/symptoms of physical distress.      Resistance Training   Training Prescription Yes    Weight black  bands    Reps 10-15    Time 10 Minutes      Interval Training   Interval Training Yes    Equipment Treadmill    Comments 2.5/1.5 (3.1 Mets) & 3/2 (4 METs)      Treadmill   MPH 3    Grade 2    Minutes 15    METs 4      Elliptical   Level 1    Speed 1    Minutes 15    METs 4.7             Social History   Tobacco Use  Smoking Status Never  Smokeless Tobacco Never    Goals Met:  Independence with exercise equipment Exercise tolerated well No report of concerns or symptoms today Strength training completed today  Goals Unmet:  Not Applicable  Comments: Service time is from 0804 to 0929    Dr. Mechele Collin is Medical Director for Pulmonary Rehab at [facility].

## 2023-02-03 ENCOUNTER — Telehealth (HOSPITAL_COMMUNITY): Payer: Self-pay | Admitting: *Deleted

## 2023-02-03 ENCOUNTER — Encounter (HOSPITAL_COMMUNITY)
Admission: RE | Admit: 2023-02-03 | Discharge: 2023-02-03 | Disposition: A | Discharge status: Home / Self Care | Payer: Medicare Other | Source: Ambulatory Visit | Attending: Pulmonary Disease | Admitting: Pulmonary Disease

## 2023-02-03 DIAGNOSIS — J849 Interstitial pulmonary disease, unspecified: Secondary | ICD-10-CM | POA: Diagnosis not present

## 2023-02-03 NOTE — Progress Notes (Signed)
Daily Session Note  Patient Details  Name: Charles Wright MRN: [id number] Date of Birth: March 15, 1935 Referring Provider:   Doristine Devoid Pulmonary Rehab Walk Test from 11/29/2022 in [facility] [place] for Heart, Vascular, & Lung Health  Referring Provider Mannam       Encounter Date: 02/03/2023  Check In:  Session Check In - 02/03/23 1329       Check-In   Supervising physician immediately available to respond to emergencies [facility] MD immediately available    Physician(s) Reather Littler, NP    [facility]    Staff Present Elissa Lovett BS, ACSM-CEP, Exercise Physiologist;Elanora Quin Gerre Scull, RN, Fuller Plan, RT    Virtual Visit No    Medication changes reported     No    Fall or balance concerns reported    No    Tobacco Cessation No Change    Warm-up and Cool-down Performed as group-led instruction    Resistance Training Performed Yes    VAD Patient? No    PAD/SET Patient? No      Pain Assessment   Currently in Pain? No/denies    Multiple Pain Sites No             Capillary Blood Glucose: No results found for this or any previous visit (from the past 24 hours).    Social History   Tobacco Use  Smoking Status Never  Smokeless Tobacco Never    Goals Met:  Independence with exercise equipment Exercise tolerated well No report of concerns or symptoms today Strength training completed today  Goals Unmet:  Not Applicable  Comments: Service time is from 1307 to 1437    Dr. Mechele Collin is Medical Director for Pulmonary Rehab at [facility].

## 2023-02-03 NOTE — Telephone Encounter (Signed)
Yes. I am ok with him trying to jog. Thank you for working with him

## 2023-02-03 NOTE — Telephone Encounter (Signed)
Dr. Isaiah Serge,  Charles Wright has a goal to jog again. He is currently doing well in pulmonary rehab and has progressed to the upright elliptical and walking on the treadmill at 3 mph, 2.5 incline for 2 min intervals. He does have SpO2 around 88-90 RA with increased workloads. We have discussed the possible need for O2 with higher workloads. If you agree, we would begin jogging on the treadmill at 30 sec intervals with 2 min lower speed. Are you ok with him trying? Thank you  Ethelda Chick BS, ACSM-CEP  01/18/2023  3:55 PM

## 2023-02-08 ENCOUNTER — Encounter (HOSPITAL_COMMUNITY)
Admission: RE | Admit: 2023-02-08 | Discharge: 2023-02-08 | Disposition: A | Discharge status: Home / Self Care | Payer: Medicare Other | Source: Ambulatory Visit | Attending: Pulmonary Disease

## 2023-02-08 DIAGNOSIS — J849 Interstitial pulmonary disease, unspecified: Secondary | ICD-10-CM

## 2023-02-08 NOTE — Progress Notes (Signed)
 Daily Session Note  Patient Details  Name: Charles Wright MRN: [id number] Date of Birth: 04-Jun-1935 Referring Provider:   Conrad Ports Pulmonary Rehab Walk Test from 11/29/2022 in [facility] for Heart, Vascular, & Lung Health  Referring Provider Mannam       Encounter Date: 02/08/2023  Check In:  Session Check In - 02/08/23 1322       Check-In   Supervising physician immediately available to respond to emergencies [facility] immediately available    Physician(s) Orren Fabry, PA    [facility]    Staff Present Cloyd Aris BS, ACSM-CEP, Exercise Physiologist;Novelle Addair Harvy, RN, Wetzel Sharps, RT    Virtual Visit No    Medication changes reported     No    Fall or balance concerns reported    No    Tobacco Cessation No Change    Warm-up and Cool-down Performed as group-led instruction    Resistance Training Performed Yes    VAD Patient? No    PAD/SET Patient? No      Pain Assessment   Currently in Pain? No/denies    Multiple Pain Sites No             Capillary Blood Glucose: No results found for this or any previous visit (from the past 24 hours).    Social History   Tobacco Use  Smoking Status Never  Smokeless Tobacco Never    Goals Met:  Independence with exercise equipment Exercise tolerated well No report of concerns or symptoms today Strength training completed today  Goals Unmet:  Not Applicable  Comments: Service time is from 1300 to 1431    Dr. Slater Staff is Medical Director for Pulmonary Rehab at [facility].

## 2023-02-10 ENCOUNTER — Encounter (HOSPITAL_COMMUNITY)
Admission: RE | Admit: 2023-02-10 | Discharge: 2023-02-10 | Disposition: A | Discharge status: Home / Self Care | Payer: Medicare Other | Source: Ambulatory Visit | Attending: Pulmonary Disease | Admitting: Pulmonary Disease

## 2023-02-10 DIAGNOSIS — J849 Interstitial pulmonary disease, unspecified: Secondary | ICD-10-CM | POA: Insufficient documentation

## 2023-02-10 NOTE — Progress Notes (Signed)
 Daily Session Note  Patient Details  Name: Charles Wright MRN: [id number] Date of Birth: 1935/06/25 Referring Provider:   Conrad Ports Pulmonary Rehab Walk Test from 11/29/2022 in [facility] for Heart, Vascular, & Lung Health  Referring Provider Mannam       Encounter Date: 02/10/2023  Check In:  Session Check In - 02/10/23 1350       Check-In   Supervising physician immediately available to respond to emergencies [facility] immediately available    Physician(s) Rosaline Skains, NP    [facility]    Staff Present Cloyd Aris BS, ACSM-CEP, Exercise Physiologist;Mary Harvy, RN, Wetzel Sharps, RT    Virtual Visit No    Medication changes reported     No    Fall or balance concerns reported    No    Tobacco Cessation No Change    Warm-up and Cool-down Performed as group-led instruction    Resistance Training Performed Yes    VAD Patient? No    PAD/SET Patient? No      Pain Assessment   Currently in Pain? No/denies    Multiple Pain Sites No             Capillary Blood Glucose: No results found for this or any previous visit (from the past 24 hours).    Social History   Tobacco Use  Smoking Status Never  Smokeless Tobacco Never    Goals Met:  Proper associated with RPD/PD & O2 Sat Independence with exercise equipment Exercise tolerated well No report of concerns or symptoms today Strength training completed today  Goals Unmet:  Not Applicable  Comments: Service time is from 1309 to 1435.    Dr. Slater Staff is Medical Director for Pulmonary Rehab at [facility].

## 2023-02-15 ENCOUNTER — Encounter (HOSPITAL_COMMUNITY)
Admission: RE | Admit: 2023-02-15 | Discharge: 2023-02-15 | Disposition: A | Discharge status: Home / Self Care | Payer: Medicare Other | Source: Ambulatory Visit | Attending: Pulmonary Disease | Admitting: Pulmonary Disease

## 2023-02-15 VITALS — Wt 190.9 lb

## 2023-02-15 DIAGNOSIS — J849 Interstitial pulmonary disease, unspecified: Secondary | ICD-10-CM

## 2023-02-15 NOTE — Progress Notes (Signed)
 Daily Session Note  Patient Details  Name: Charles Wright MRN: [id number] Date of Birth: Oct 19, 1935 Referring Provider:   Conrad Ports Pulmonary Rehab Walk Test from 11/29/2022 in [facility] for Heart, Vascular, & Lung Health  Referring Provider Mannam       Encounter Date: 02/15/2023  Check In:  Session Check In - 02/15/23 1320       Check-In   Supervising physician immediately available to respond to emergencies [facility] immediately available    Physician(s) Rosaline Skains, NP    [facility]    Staff Present Cloyd Aris BS, ACSM-CEP, Exercise Physiologist;Mary Harvy, RN, BSN;Ozetta Flatley Claudene Neita Moats, MS, ACSM-CEP, Exercise Physiologist    Virtual Visit No    Medication changes reported     No    Fall or balance concerns reported    No    Tobacco Cessation No Change    Warm-up and Cool-down Performed as group-led instruction    Resistance Training Performed Yes    VAD Patient? No    PAD/SET Patient? No      Pain Assessment   Currently in Pain? No/denies    Multiple Pain Sites No             Capillary Blood Glucose: No results found for this or any previous visit (from the past 24 hours).   Exercise Prescription Changes - 02/15/23 1400       Response to Exercise   Blood Pressure (Admit) 107/66    Blood Pressure (Exercise) 126/58    Blood Pressure (Exit) 110/58    Heart Rate (Admit) 69 bpm    Heart Rate (Exercise) 112 bpm    Heart Rate (Exit) 80 bpm    Oxygen  Saturation (Admit) 98 %    Oxygen  Saturation (Exercise) 89 %    Oxygen  Saturation (Exit) 95 %    Rating of Perceived Exertion (Exercise) 14    Perceived Dyspnea (Exercise) 2    Duration Continue with 30 min of aerobic exercise without signs/symptoms of physical distress.    Intensity THRR New      Progression   Progression Continue to progress workloads to maintain intensity without signs/symptoms of physical distress.      Resistance Training    Training Prescription Yes    Weight black bands    Reps 10-15    Time 10 Minutes      Interval Training   Interval Training Yes    Equipment Treadmill    Comments 2 min@ 3.5/1.5& 41min@ 2.5/1.5      Treadmill   METs 4.4      Elliptical   Level 1    Speed 1    Minutes 15    METs 5.1             Social History   Tobacco Use  Smoking Status Never  Smokeless Tobacco Never    Goals Met:  Proper associated with RPD/PD & O2 Sat Independence with exercise equipment Exercise tolerated well No report of concerns or symptoms today Strength training completed today  Goals Unmet:  Not Applicable  Comments: Service time is from 1309 to 1435.    Dr. Slater Staff is Medical Director for Pulmonary Rehab at [facility].

## 2023-02-17 ENCOUNTER — Encounter (HOSPITAL_COMMUNITY)
Admission: RE | Admit: 2023-02-17 | Discharge: 2023-02-17 | Disposition: A | Discharge status: Home / Self Care | Payer: Medicare Other | Source: Ambulatory Visit | Attending: Pulmonary Disease | Admitting: Pulmonary Disease

## 2023-02-17 DIAGNOSIS — J849 Interstitial pulmonary disease, unspecified: Secondary | ICD-10-CM

## 2023-02-17 NOTE — Progress Notes (Signed)
 Daily Session Note  Patient Details  Name: Charles Wright MRN: [id number] Date of Birth: 1935-12-08 Referring Provider:   Conrad Ports Pulmonary Rehab Walk Test from 11/29/2022 in [facility] for Heart, Vascular, & Lung Health  Referring Provider Mannam       Encounter Date: 02/17/2023  Check In:  Session Check In - 02/17/23 1318       Check-In   Supervising physician immediately available to respond to emergencies [facility] immediately available    Physician(s) Barnie Press, NP    Location [facility]    Staff Present Cloyd Aris BS, ACSM-CEP, Exercise Physiologist;Madelynne Lasker Harvy, RN, BSN;Casey Claudene Neita Moats, MS, ACSM-CEP, Exercise Physiologist    Virtual Visit No    Medication changes reported     No    Fall or balance concerns reported    No    Tobacco Cessation No Change    Warm-up and Cool-down Performed as group-led instruction    Resistance Training Performed Yes    VAD Patient? No    PAD/SET Patient? No      Pain Assessment   Currently in Pain? No/denies    Multiple Pain Sites No             Capillary Blood Glucose: No results found for this or any previous visit (from the past 24 hours).    Social History   Tobacco Use  Smoking Status Never  Smokeless Tobacco Never    Goals Met:  Independence with exercise equipment Exercise tolerated well No report of concerns or symptoms today Strength training completed today Pt started running on the treadmill today  Goals Unmet:  Not Applicable  Comments: Service time is from 1304 to 1426    Dr. Slater Staff is Medical Director for Pulmonary Rehab at [facility].

## 2023-02-22 ENCOUNTER — Encounter (HOSPITAL_COMMUNITY)
Admission: RE | Admit: 2023-02-22 | Discharge: 2023-02-22 | Disposition: A | Discharge status: Home / Self Care | Payer: Medicare Other | Source: Ambulatory Visit | Attending: Pulmonary Disease

## 2023-02-22 DIAGNOSIS — J849 Interstitial pulmonary disease, unspecified: Secondary | ICD-10-CM

## 2023-02-22 NOTE — Progress Notes (Signed)
 Daily Session Note  Patient Details  Name: LEAM MADERO MRN: [id number] Date of Birth: 04/17/1935 Referring Provider:   Conrad Ports Pulmonary Rehab Walk Test from 11/29/2022 in [facility] for Heart, Vascular, & Lung Health  Referring Provider Mannam       Encounter Date: 02/22/2023  Check In:  Session Check In - 02/22/23 1409       Check-In   Supervising physician immediately available to respond to emergencies [facility] MD immediately available    Physician(s) Lamarr Satterfield, NP    [facility]    Staff Present Cloyd Aris BS, ACSM-CEP, Exercise Physiologist;Mary Harvy, RN, BSN;Casey Claudene Neita Moats, MS, ACSM-CEP, Exercise Physiologist    Virtual Visit No    Medication changes reported     No    Fall or balance concerns reported    No    Tobacco Cessation No Change    Warm-up and Cool-down Performed as group-led instruction    Resistance Training Performed Yes    VAD Patient? No    PAD/SET Patient? No      Pain Assessment   Currently in Pain? No/denies    Multiple Pain Sites No             Capillary Blood Glucose: No results found for this or any previous visit (from the past 24 hours).    Social History   Tobacco Use  Smoking Status Never  Smokeless Tobacco Never    Goals Met:  Proper associated with RPD/PD & O2 Sat Exercise tolerated well No report of concerns or symptoms today Strength training completed today  Goals Unmet:  Not Applicable  Comments: Service time is from 1314 to 1435.    Dr. Slater Staff is Medical Director for Pulmonary Rehab at [facility].

## 2023-02-23 ENCOUNTER — Ambulatory Visit: Payer: Medicare Other | Admitting: Pulmonary Disease

## 2023-02-23 ENCOUNTER — Encounter: Payer: Self-pay | Admitting: Pulmonary Disease

## 2023-02-23 VITALS — BP 118/64 | HR 70 | Temp 97.8°F | Ht 67.5 in | Wt 195.0 lb

## 2023-02-23 DIAGNOSIS — Z5181 Encounter for therapeutic drug level monitoring: Secondary | ICD-10-CM

## 2023-02-23 DIAGNOSIS — J849 Interstitial pulmonary disease, unspecified: Secondary | ICD-10-CM

## 2023-02-23 DIAGNOSIS — R911 Solitary pulmonary nodule: Secondary | ICD-10-CM

## 2023-02-23 NOTE — Progress Notes (Signed)
Charles Wright    [id number]    05-21-1935  Primary Care Physician:Costella, Darci Current, PA-C (Inactive)  Referring Physician: Alyson Ingles, PA-C [address] [place],  [place] [address]  Chief complaint: Follow up for ILD  HPI: 88 year old with history of prostate, bladder, renal cancer, atrial fibrillation, coronary artery disease, sleep apnea Has complains of chronic cough, nonproductive in nature for the past 1 year.  Not associated with dyspnea or congestion He has been tried on Protonix for empiric treatment of GERD with no improvement  He had a high-res CT recently which showed progressive probable UIP pattern pulmonary fibrosis and has been referred to clinic for further evaluation  Reports a viral illness in January 2020 after attending a conference in [place].  He was not tested for Covid at that time In 2022 we had referred him to rheumatology for elevated ANA.  Seen by Dr. Corliss Skains who felt he did not have any interstitial lung disease Also saw GI for acid reflux with no ongoing symptoms  Pets: Dogs, cats.  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  No asbestos, mold, hot tub, Jacuzzi or down pillows, comforters. ILD questionnaire 01/14/20-negative except as above. Smoking history: Occasionally smokes cigarettes and pipe in college Travel history: No significant travel Relevant family history: No significant family history of lung disease.  Interval history: Discussed the use of AI scribe software for clinical note transcription with the patient, who gave verbal consent to proceed.  The patient, with a history of interstitial lung disease or pulmonary fibrosis, presents with a persistent cough. He reports that the cough is becoming more prevalent and is only temporarily relieved by Hall's lemon honey lozenges. He denies  taking any cough medicine. He has been attending pulmonary rehab and reports that it is helping. He also reports a history of exposure to high levels of ammonia and a past infection of valley fever. He has a history of cancer and currently has only one functioning kidney. He also reports a decrease in blood pressure to around 90/50. He has a goal to be able to run after completing pulmonary rehab and is currently jogging on the treadmill. However, he reports that his O2 level in his blood is decreasing to around 86, at which point he is put on oxygen.   Outpatient Encounter Medications as of 02/23/2023  Medication Sig   acetaminophen (TYLENOL) 500 MG tablet Take 1,000 mg by mouth every 6 (six) hours as needed for moderate pain or headache.   allopurinol (ZYLOPRIM) 300 MG tablet Take 300 mg by mouth every morning.    apixaban (ELIQUIS) 5 MG TABS tablet Take 1 tablet (5 mg total) by mouth 2 (two) times daily.   aspirin EC 81 MG tablet Take 81 mg by mouth every evening.   Cholecalciferol (VITAMIN D-3) 125 MCG (5000 UT) TABS Take 5,000 Units by mouth daily.   doxycycline (MONODOX) 50 MG capsule Take 50 mg by mouth daily.   EPINEPHrine 0.3 mg/0.3 mL IJ SOAJ injection Inject 0.3 mLs (0.3 mg total) into the muscle once. (Patient taking differently: Inject 0.3 mg into the muscle as needed for anaphylaxis.)   GLUCOSAMINE SULFATE PO Take 750 mg by mouth daily.   ipratropium (ATROVENT) 0.03 % nasal spray Place 1 spray into both nostrils 2 (two) times daily.   levothyroxine (SYNTHROID, LEVOTHROID)  50 MCG tablet Take 50 mcg by mouth daily before breakfast.   metoprolol tartrate (LOPRESSOR) 25 MG tablet Take 1 tablet (25 mg total) by mouth 2 (two) times daily.   nitroGLYCERIN (NITROSTAT) 0.4 MG SL tablet Place 1 tablet (0.4 mg total) under the tongue every 5 (five) minutes x 3 doses as needed for chest pain.   rosuvastatin (CRESTOR) 20 MG tablet TAKE ONE TABLET BY MOUTH DAILY   Testosterone 1.62 % GEL Apply 2 Pump  topically every other day. Applied to shoulder area   No facility-administered encounter medications on file as of 02/23/2023.    Physical Exam: Blood pressure 118/64, pulse 70, temperature 97.8 F (36.6 C), temperature source Oral, height 5' 7.5" (1.715 m), weight 195 lb (88.5 kg), SpO2 96%. Gen:      No acute distress HEENT:  EOMI, sclera anicteric Neck:     No masses; no thyromegaly Lungs:    Clear to auscultation bilaterally; normal respiratory effort CV:         Regular rate and rhythm; no murmurs Abd:      + bowel sounds; soft, non-tender; no palpable masses, no distension Ext:    No edema; adequate peripheral perfusion Skin:      Warm and dry; no rash Neuro: alert and oriented x 3 Psych: normal mood and affect   Data Reviewed: Imaging: CT abdomen pelvis 03/18/2019 -mild interstitial changes at the base.  High-res CT 12/07/2019-left upper lobe pulmonary nodule measuring 5 mm, basilar predominant fibrosis and probable UIP pattern.  Progressive compared to prior scans.  High resolution CT 11/10/2022-progressive pulmonary fibrosis and probable UIP pattern.  4 mm right middle lobe nodule I have reviewed the images personally.  PFTs: 06/18/2015 FVC 3.94 (6%), FEV1 3.11 (18%), F/F 79, TLC 6.46 [90%], DLCO 24.74 [85%] Normal test  03/07/2020 FVC 3.73 [105%], FEV1 2.94 (119%], F/F 79, TLC 5.38 [81%], DLCO 15.99 [71%] Mild diffusion defect  Labs:  ANA serologies 01/14/2020-positive for ANA 1 is to 80, p-ANCA 1 is to 80  Assessment:  Interstitial lung disease Review of CT scan shows probable UIP pattern fibrosis.  He has minimal changes at the base dating back to at least 2012 on CTs of the abdomen.  It is more clearly progressive since his last CT abdomen in 2020.  This is suggestive of IPF in the absence of any exposures or CTD symptoms  CTD serologies reviewed with mild elevation in ANA, ANCA.  This may be nonspecific Rheumatology referral completed with no evidence of autoimmune  process At last visit he was not interested in biopsy or treatment with antifibrotics at this stage.   However with progressive disease with worsening cough. Discussed the progressive nature of the disease and the potential benefits of medication to slow progression. Noted potential side effects including liver issues, GI symptoms, and weight loss. -Start medication ofev option chosen by patient. -Obtain baseline liver function tests today. - Referral to pulmonary rehab  Lung Nodule Small lung nodule identified on CT scan, not likely related to patient's historical chest pain. Plan for follow-up in one year. -Schedule follow-up CT scan in 1 year.  Plan/Recommendations: Start Ofev Check baseline labs  Chilton Greathouse MD [facility] 02/23/2023, 3:09 PM  CC: Costella, Darci Current, PA*

## 2023-02-23 NOTE — Patient Instructions (Signed)
 VISIT SUMMARY:  During today's visit, we discussed your persistent cough and its relation to your interstitial lung disease (pulmonary fibrosis). We also reviewed your recent CT scan, which showed a small lung nodule. We talked about your current symptoms, including your decreased blood pressure and oxygen  levels during exercise. We also discussed your goals for pulmonary rehab.  YOUR PLAN:  -INTERSTITIAL LUNG DISEASE (PULMONARY FIBROSIS): Pulmonary fibrosis is a progressive lung disease that causes scarring of the lung tissue, leading to breathing difficulties. We discussed starting a new medication to help slow the progression of the disease, which you will take twice daily. We also talked about potential side effects, including liver issues, gastrointestinal symptoms, and weight loss. We will obtain baseline liver function tests today to monitor your liver health before starting the medication.  -LUNG NODULE: A lung nodule is a small growth in the lung that is usually benign. Your recent CT scan showed a small nodule, which is not likely related to your past chest pain. We will schedule a follow-up CT scan in one year to monitor it.  INSTRUCTIONS:  Please follow up in 3 months to assess your response to the new medication and to monitor the progression of your disease. Additionally, schedule a follow-up CT scan in one year to check on the lung nodule.

## 2023-02-24 ENCOUNTER — Encounter (HOSPITAL_COMMUNITY)
Admission: RE | Admit: 2023-02-24 | Discharge: 2023-02-24 | Disposition: A | Discharge status: Home / Self Care | Payer: Medicare Other | Source: Ambulatory Visit | Attending: Pulmonary Disease | Admitting: Pulmonary Disease

## 2023-02-24 DIAGNOSIS — J849 Interstitial pulmonary disease, unspecified: Secondary | ICD-10-CM | POA: Diagnosis not present

## 2023-02-24 LAB — CBC WITH DIFFERENTIAL/PLATELET
Basophils Absolute: 0.1 10*3/uL (ref 0.0–0.1)
Basophils Relative: 0.9 % (ref 0.0–3.0)
Eosinophils Absolute: 0.4 10*3/uL (ref 0.0–0.7)
Eosinophils Relative: 4 % (ref 0.0–5.0)
HCT: 48.3 % (ref 39.0–52.0)
Hemoglobin: 15.6 g/dL (ref 13.0–17.0)
Lymphocytes Relative: 16.2 % (ref 12.0–46.0)
Lymphs Abs: 1.5 10*3/uL (ref 0.7–4.0)
MCHC: 32.4 g/dL (ref 30.0–36.0)
MCV: 94.9 fL (ref 78.0–100.0)
Monocytes Absolute: 1 10*3/uL (ref 0.1–1.0)
Monocytes Relative: 10.7 % (ref 3.0–12.0)
Neutro Abs: 6.2 10*3/uL (ref 1.4–7.7)
Neutrophils Relative %: 68.2 % (ref 43.0–77.0)
Platelets: 273 10*3/uL (ref 150.0–400.0)
RBC: 5.09 Mil/uL (ref 4.22–5.81)
RDW: 15.1 % (ref 11.5–15.5)
WBC: 9 10*3/uL (ref 4.0–10.5)

## 2023-02-24 LAB — COMPREHENSIVE METABOLIC PANEL
ALT: 15 U/L (ref 0–53)
AST: 25 U/L (ref 0–37)
Albumin: 4.1 g/dL (ref 3.5–5.2)
Alkaline Phosphatase: 65 U/L (ref 39–117)
BUN: 24 mg/dL — ABNORMAL HIGH (ref 6–23)
CO2: 28 meq/L (ref 19–32)
Calcium: 9.5 mg/dL (ref 8.4–10.5)
Chloride: 105 meq/L (ref 96–112)
Creatinine, Ser: 1.45 mg/dL (ref 0.40–1.50)
GFR: 43.82 mL/min — ABNORMAL LOW (ref >60.00)
Glucose, Bld: 85 mg/dL (ref 70–99)
Potassium: 4.6 meq/L (ref 3.5–5.1)
Sodium: 141 meq/L (ref 135–145)
Total Bilirubin: 1.3 mg/dL — ABNORMAL HIGH (ref 0.2–1.2)
Total Protein: 6.5 g/dL (ref 6.0–8.3)

## 2023-02-24 NOTE — Progress Notes (Signed)
Daily Session Note  Patient Details  Name: Charles Wright MRN: [id number] Date of Birth: 08-09-1935 Referring Provider:   Doristine Devoid Pulmonary Rehab Walk Test from 11/29/2022 in [facility]  Referring Provider Mannam       Encounter Date: 02/24/2023  Check In:  Session Check In - 02/24/23 1332       Check-In   Supervising physician immediately available to respond to emergencies [facility] MD immediately available    Physician(s) Joni Reining, NP    Location [facility]    Staff Present Elissa Lovett BS, ACSM-CEP, Exercise Physiologist;Myrle Wanek Gerre Scull, RN, BSN;Casey Charlean Sanfilippo, MS, ACSM-CEP, Exercise Physiologist    Virtual Visit No    Medication changes reported     No    Fall or balance concerns reported    No    Tobacco Cessation No Change    Warm-up and Cool-down Performed as group-led instruction    Resistance Training Performed Yes    VAD Patient? No    PAD/SET Patient? No      Pain Assessment   Currently in Pain? No/denies    Multiple Pain Sites No             Capillary Blood Glucose: Results for orders placed or performed in visit on 02/23/23 (from the past 24 hours)  Comprehensive metabolic panel     Status: Abnormal   Collection Time: 02/23/23  3:52 PM  Result Value Ref Range   Sodium 141 135 - 145 mEq/L   Potassium 4.6 3.5 - 5.1 mEq/L   Chloride 105 96 - 112 mEq/L   CO2 28 19 - 32 mEq/L   Glucose, Bld 85 70 - 99 mg/dL   BUN 24 (H) 6 - 23 mg/dL   Creatinine, Ser 6.96 0.40 - 1.50 mg/dL   Total Bilirubin 1.3 (H) 0.2 - 1.2 mg/dL   Alkaline Phosphatase 65 39 - 117 U/L   AST 25 0 - 37 U/L   ALT 15 0 - 53 U/L   Total Protein 6.5 6.0 - 8.3 g/dL   Albumin 4.1 3.5 - 5.2 g/dL   GFR 29.52 (L) >84.13 mL/min   Calcium 9.5 8.4 - 10.5 mg/dL  CBC w/Diff     Status: None   Collection Time: 02/23/23  3:52 PM  Result Value Ref Range   WBC 9.0 4.0 - 10.5 K/uL   RBC 5.09 4.22 - 5.81  Mil/uL   Hemoglobin 15.6 13.0 - 17.0 g/dL   HCT 24.4 01.0 - 27.2 %   MCV 94.9 78.0 - 100.0 fl   MCHC 32.4 30.0 - 36.0 g/dL   RDW 53.6 64.4 - 03.4 %   Platelets 273.0 150.0 - 400.0 K/uL   Neutrophils Relative % 68.2 43.0 - 77.0 %   Lymphocytes Relative 16.2 12.0 - 46.0 %   Monocytes Relative 10.7 3.0 - 12.0 %   Eosinophils Relative 4.0 0.0 - 5.0 %   Basophils Relative 0.9 0.0 - 3.0 %   Neutro Abs 6.2 1.4 - 7.7 K/uL   Lymphs Abs 1.5 0.7 - 4.0 K/uL   Monocytes Absolute 1.0 0.1 - 1.0 K/uL   Eosinophils Absolute 0.4 0.0 - 0.7 K/uL   Basophils Absolute 0.1 0.0 - 0.1 K/uL      Social History   Tobacco Use  Smoking Status Never  Smokeless Tobacco Never    Goals Met:  Independence with exercise equipment Exercise tolerated well No report of concerns or  symptoms today Strength training completed today  Goals Unmet:  Not Applicable  Comments: Service time is from 1309 to 1436    Dr. Mechele Collin is Medical Director for Pulmonary Rehab at [facility].

## 2023-02-25 LAB — ANA,IFA RA DIAG PNL W/RFLX TIT/PATN
Anti Nuclear Antibody (ANA): POSITIVE — AB
Cyclic Citrullin Peptide Ab: <16 U
Rheumatoid fact SerPl-aCnc: <10 [IU]/mL (ref <14)

## 2023-02-25 LAB — ANTI-NUCLEAR AB-TITER (ANA TITER)
ANA TITER: 1:80 {titer} — ABNORMAL HIGH
ANA Titer 1: 1:40 {titer} — ABNORMAL HIGH

## 2023-03-01 ENCOUNTER — Encounter (HOSPITAL_COMMUNITY)
Admission: RE | Admit: 2023-03-01 | Discharge: 2023-03-01 | Disposition: A | Discharge status: Home / Self Care | Payer: Medicare Other | Source: Ambulatory Visit | Attending: Pulmonary Disease

## 2023-03-01 VITALS — Wt 190.5 lb

## 2023-03-01 DIAGNOSIS — J849 Interstitial pulmonary disease, unspecified: Secondary | ICD-10-CM | POA: Diagnosis not present

## 2023-03-01 DIAGNOSIS — L821 Other seborrheic keratosis: Secondary | ICD-10-CM | POA: Diagnosis not present

## 2023-03-01 DIAGNOSIS — L814 Other melanin hyperpigmentation: Secondary | ICD-10-CM | POA: Diagnosis not present

## 2023-03-01 DIAGNOSIS — D225 Melanocytic nevi of trunk: Secondary | ICD-10-CM | POA: Diagnosis not present

## 2023-03-01 DIAGNOSIS — Z08 Encounter for follow-up examination after completed treatment for malignant neoplasm: Secondary | ICD-10-CM | POA: Diagnosis not present

## 2023-03-01 DIAGNOSIS — L570 Actinic keratosis: Secondary | ICD-10-CM | POA: Diagnosis not present

## 2023-03-01 NOTE — Progress Notes (Signed)
Daily Session Note  Patient Details  Name: Charles Wright MRN: [id number] Date of Birth: [birth year] Referring Provider:   Doristine Devoid Pulmonary Rehab Walk Test from 11/29/2022 in [facility]  Referring Provider Mannam       Encounter Date: 03/01/2023  Check In:  Session Check In - 03/01/23 1459       Check-In   Supervising physician immediately available to respond to emergencies [facility] MD immediately available    Physician(s) Bernadene Person, NP    Location [facility]    Staff Present Essie Hart, RN, BSN;Finian Helvey Katrinka Blazing, Zella Richer, MS, ACSM-CEP, Exercise Physiologist;Johnny Hale Bogus, MS, Exercise Physiologist    Virtual Visit No    Medication changes reported     No    Fall or balance concerns reported    No    Tobacco Cessation No Change    Warm-up and Cool-down Performed as group-led instruction    Resistance Training Performed Yes    VAD Patient? No    PAD/SET Patient? No      Pain Assessment   Currently in Pain? No/denies    Multiple Pain Sites No             Capillary Blood Glucose: No results found for this or any previous visit (from the past 24 hours).   Exercise Prescription Changes - 03/01/23 1500       Response to Exercise   Blood Pressure (Admit) 110/58    Blood Pressure (Exercise) 138/70    Blood Pressure (Exit) 104/60    Heart Rate (Admit) 71 bpm    Heart Rate (Exercise) 127 bpm    Heart Rate (Exit) 87 bpm    Oxygen Saturation (Admit) 94 %    Oxygen Saturation (Exercise) 89 %    Oxygen Saturation (Exit) 93 %    Rating of Perceived Exertion (Exercise) 15    Perceived Dyspnea (Exercise) 3    Duration Continue with 30 min of aerobic exercise without signs/symptoms of physical distress.    Intensity THRR New      Progression   Progression Continue to progress workloads to maintain intensity without signs/symptoms of physical distress.      Resistance Training   Training  Prescription Yes    Weight black bands    Reps 10-15    Time 10 Minutes      Interval Training   Interval Training Yes    Equipment Treadmill    Comments 75min@2 .5/1.5/3. & 4/1.5/5.      Oxygen   Oxygen Intermittent    Liters 2      Elliptical   Level 1    Speed 2    Minutes 15    METs 5.1      Oxygen   Maintain Oxygen Saturation 88% or higher             Social History   Tobacco Use  Smoking Status Never  Smokeless Tobacco Never    Goals Met:  Proper associated with RPD/PD & O2 Sat Independence with exercise equipment Exercise tolerated well No report of concerns or symptoms today Strength training completed today  Goals Unmet:  Not Applicable  Comments: Service time is from 1309 to 1426.    Dr. Mechele Collin is Medical Director for Pulmonary Rehab at [facility].

## 2023-03-02 NOTE — Progress Notes (Signed)
Pulmonary Individual Treatment Plan  Patient Details  Name: FISHEL BIRKEY MRN: [id number] Date of Birth: 02-19-1935 Referring Provider:   Doristine Devoid Pulmonary Rehab Walk Test from 11/29/2022 in [facility]  Referring Provider Mannam       Initial Encounter Date:  Flowsheet Row Pulmonary Rehab Walk Test from 11/29/2022 in [facility]  Date 11/29/22       Visit Diagnosis: Interstitial lung disease (HCC)  Patient's Home Medications on Admission:   Current Outpatient Medications:    acetaminophen (TYLENOL) 500 MG tablet, Take 1,000 mg by mouth every 6 (six) hours as needed for moderate pain or headache., Disp: , Rfl:    allopurinol (ZYLOPRIM) 300 MG tablet, Take 300 mg by mouth every morning. , Disp: , Rfl:    apixaban (ELIQUIS) 5 MG TABS tablet, Take 1 tablet (5 mg total) by mouth 2 (two) times daily., Disp: 60 tablet, Rfl: 11   aspirin EC 81 MG tablet, Take 81 mg by mouth every evening., Disp: , Rfl:    Cholecalciferol (VITAMIN D-3) 125 MCG (5000 UT) TABS, Take 5,000 Units by mouth daily., Disp: , Rfl:    doxycycline (MONODOX) 50 MG capsule, Take 50 mg by mouth daily., Disp: , Rfl:    EPINEPHrine 0.3 mg/0.3 mL IJ SOAJ injection, Inject 0.3 mLs (0.3 mg total) into the muscle once. (Patient taking differently: Inject 0.3 mg into the muscle as needed for anaphylaxis.), Disp: 2 Device, Rfl: 1   GLUCOSAMINE SULFATE PO, Take 750 mg by mouth daily., Disp: , Rfl:    ipratropium (ATROVENT) 0.03 % nasal spray, Place 1 spray into both nostrils 2 (two) times daily., Disp: , Rfl:    levothyroxine (SYNTHROID, LEVOTHROID) 50 MCG tablet, Take 50 mcg by mouth daily before breakfast., Disp: , Rfl:    metoprolol tartrate (LOPRESSOR) 25 MG tablet, Take 1 tablet (25 mg total) by mouth 2 (two) times daily., Disp: 180 tablet, Rfl: 2   nitroGLYCERIN (NITROSTAT) 0.4 MG SL tablet, Place 1  tablet (0.4 mg total) under the tongue every 5 (five) minutes x 3 doses as needed for chest pain., Disp: 25 tablet, Rfl: 12   rosuvastatin (CRESTOR) 20 MG tablet, TAKE ONE TABLET BY MOUTH DAILY, Disp: 90 tablet, Rfl: 3   Testosterone 1.62 % GEL, Apply 2 Pump topically every other day. Applied to shoulder area, Disp: , Rfl:   Past Medical History: Past Medical History:  Diagnosis Date   Acute gout of left ankle    06-14-2015   Bladder cancer (HCC)    BCG's tx's   BPH (benign prostatic hypertrophy)    CAD (coronary artery disease) CARDIOLOGIST-  DR TILLEY   a. NSTEMI 12/2014 -  99% dLAD-2 (not amenable to PCI), 50% dLAD-1, 60% mLAD. Medical therapy was recommended.   Cancer of kidney (HCC)    left   Cough    HARSH NONPRODUCTIVE COUGH 1 AND 1/2 WEEKS   GERD (gastroesophageal reflux disease)    takes  OTC periodically   Gilbert's syndrome    H/O cardiac catheterization    a. Note: Difficult radial access in 12/2014 - recommend femoral approach if cath needed in the future.   History of kidney stones    History of non-ST elevation myocardial infarction (NSTEMI)    12-12-2014   CARDIAC CATH W/ NO INTERVENTION X 2FEW DAYS APART   History of spinal fracture 2002   LUMBAR  Hyperlipidemia    Hypertension    Hypothyroidism    Myocardial infarction (HCC)    OSA on CPAP    SET ON 7 USES SOME NIGHTS   Paroxysmal A-fib [facility])    cardiologist-  dr Donnie Aho   Prostate cancer [facility]) 2019   last PSA  2.9  (montiored by urologist  dr Mena Goes) Vanessa Kick AND FEB 2019 RAD DONE   Shingles    2 weeks ago   Wears glasses     Tobacco Use: Social History   Tobacco Use  Smoking Status Never  Smokeless Tobacco Never    Labs: Review Flowsheet  More data exists      Latest Ref Rng & Units 12/12/2014 10/10/2018 04/08/2019 05/14/2021 09/01/2021  Labs for ITP Cardiac and Pulmonary Rehab  Cholestrol 100 - 199 mg/dL 403  474  - - 259   LDL (calc) 0 - 99 mg/dL 563  875  - - 55   Direct LDL 0 - 99 mg/dL - - -  643  -  HDL-C >32 mg/dL 45  42  - - 38   Trlycerides 0 - 149 mg/dL 88  951  - - 90   Bicarbonate 20.0 - 28.0 mmol/L - - 24.6  - -  TCO2 22 - 32 mmol/L - - 26  - -  Acid-base deficit 0.0 - 2.0 mmol/L - - 2.0  - -  O2 Saturation % - - 99.0  - -    Capillary Blood Glucose: No results found for: "GLUCAP"   Pulmonary Assessment Scores:  Pulmonary Assessment Scores     Row Name 11/29/22 0841         ADL UCSD   ADL Phase Entry     SOB Score total 22       CAT Score   CAT Score 18       mMRC Score   mMRC Score 1             UCSD: Self-administered rating of dyspnea associated with activities of daily living (ADLs) 6-point scale (0 = "not at all" to 5 = "maximal or unable to do because of breathlessness")  Scoring Scores range from 0 to 120.  Minimally important difference is 5 units  CAT: CAT can identify the health impairment of COPD patients and is better correlated with disease progression.  CAT has a scoring range of zero to 40. The CAT score is classified into four groups of low (less than 10), medium (10 - 20), high (21-30) and very high (31-40) based on the impact level of disease on health status. A CAT score over 10 suggests significant symptoms.  A worsening CAT score could be explained by an exacerbation, poor medication adherence, poor inhaler technique, or progression of COPD or comorbid conditions.  CAT MCID is 2 points  mMRC: mMRC (Modified Medical Research Council) Dyspnea Scale is used to assess the degree of baseline functional disability in patients of respiratory disease due to dyspnea. No minimal important difference is established. A decrease in score of 1 point or greater is considered a positive change.   Pulmonary Function Assessment:  Pulmonary Function Assessment - 11/29/22 0832       Breath   Bilateral Breath Sounds Clear    Shortness of Breath Yes;Limiting activity             Exercise Target Goals: Exercise Program  Goal: Individual exercise prescription set using results from initial 6 min walk test and THRR while considering  patient's activity  barriers and safety.   Exercise Prescription Goal: Initial exercise prescription builds to 30-45 minutes a day of aerobic activity, 2-3 days per week.  Home exercise guidelines will be given to patient during program as part of exercise prescription that the participant will acknowledge.  Activity Barriers & Risk Stratification:  Activity Barriers & Cardiac Risk Stratification - 11/29/22 0833       Activity Barriers & Cardiac Risk Stratification   Activity Barriers Neck/Spine Problems;Deconditioning;Muscular Weakness;Shortness of Breath;Joint Problems;Back Problems             6 Minute Walk:  6 Minute Walk     Row Name 11/29/22 0956         6 Minute Walk   Phase Initial     Distance 1250 feet     Walk Time 6 minutes     # of Rest Breaks 0     MPH 2.37     METS 1.67     RPE 9     Perceived Dyspnea  1     VO2 Peak 5.85     Symptoms No     Resting HR 79 bpm     Resting BP 104/60     Resting Oxygen Saturation  95 %     Exercise Oxygen Saturation  during 6 min walk 91 %     Max Ex. HR 93 bpm     Max Ex. BP 110/60     2 Minute Post BP 118/68  taken after H2O given       Interval HR   1 Minute HR 81     2 Minute HR 87     3 Minute HR 90     4 Minute HR 90     5 Minute HR 93     6 Minute HR 78     2 Minute Post HR 77     Interval Heart Rate? Yes       Interval Oxygen   Interval Oxygen? Yes     Baseline Oxygen Saturation % 95 %     1 Minute Oxygen Saturation % 95 %     1 Minute Liters of Oxygen 0 L     2 Minute Oxygen Saturation % 92 %     2 Minute Liters of Oxygen 0 L     3 Minute Oxygen Saturation % 92 %     3 Minute Liters of Oxygen 0 L     4 Minute Oxygen Saturation % 91 %     4 Minute Liters of Oxygen 0 L     5 Minute Oxygen Saturation % 92 %     5 Minute Liters of Oxygen 0 L     6 Minute Oxygen Saturation % 93 %     6  Minute Liters of Oxygen 0 L     2 Minute Post Oxygen Saturation % 98 %     2 Minute Post Liters of Oxygen 0 L              Oxygen Initial Assessment:  Oxygen Initial Assessment - 11/29/22 0832       Home Oxygen   Home Oxygen Device None    Sleep Oxygen Prescription CPAP    Home Exercise Oxygen Prescription None    Home Resting Oxygen Prescription None    Compliance with Home Oxygen Use No      Initial 6 min Walk   Oxygen Used None      Program Oxygen  Prescription   Program Oxygen Prescription None      Intervention   Short Term Goals To learn and exhibit compliance with exercise, home and travel O2 prescription;To learn and understand importance of maintaining oxygen saturations>88%;To learn and demonstrate proper use of respiratory medications;To learn and understand importance of monitoring SPO2 with pulse oximeter and demonstrate accurate use of the pulse oximeter.;To learn and demonstrate proper pursed lip breathing techniques or other breathing techniques.     Long  Term Goals Exhibits compliance with exercise, home  and travel O2 prescription;Verbalizes importance of monitoring SPO2 with pulse oximeter and return demonstration;Maintenance of O2 saturations>88%;Exhibits proper breathing techniques, such as pursed lip breathing or other method taught during program session;Compliance with respiratory medication;Demonstrates proper use of MDI's             Oxygen Re-Evaluation:  Oxygen Re-Evaluation     Row Name 11/29/22 1509 12/28/22 0837 01/20/23 0923 02/22/23 0901       Program Oxygen Prescription   Program Oxygen Prescription None None None Continuous    Liters per minute -- -- -- 2    Comments -- -- -- intermittenly needs 2L at higher intensities      Home Oxygen   Home Oxygen Device None None None None    Sleep Oxygen Prescription CPAP CPAP CPAP CPAP    Home Exercise Oxygen Prescription None None None None    Home Resting Oxygen Prescription None None None  None    Compliance with Home Oxygen Use No No No No      Goals/Expected Outcomes   Short Term Goals To learn and exhibit compliance with exercise, home and travel O2 prescription;To learn and understand importance of maintaining oxygen saturations>88%;To learn and demonstrate proper use of respiratory medications;To learn and understand importance of monitoring SPO2 with pulse oximeter and demonstrate accurate use of the pulse oximeter.;To learn and demonstrate proper pursed lip breathing techniques or other breathing techniques.  To learn and exhibit compliance with exercise, home and travel O2 prescription;To learn and understand importance of maintaining oxygen saturations>88%;To learn and demonstrate proper use of respiratory medications;To learn and understand importance of monitoring SPO2 with pulse oximeter and demonstrate accurate use of the pulse oximeter.;To learn and demonstrate proper pursed lip breathing techniques or other breathing techniques.  To learn and exhibit compliance with exercise, home and travel O2 prescription;To learn and understand importance of maintaining oxygen saturations>88%;To learn and demonstrate proper use of respiratory medications;To learn and understand importance of monitoring SPO2 with pulse oximeter and demonstrate accurate use of the pulse oximeter.;To learn and demonstrate proper pursed lip breathing techniques or other breathing techniques.  To learn and exhibit compliance with exercise, home and travel O2 prescription;To learn and understand importance of maintaining oxygen saturations>88%;To learn and demonstrate proper use of respiratory medications;To learn and understand importance of monitoring SPO2 with pulse oximeter and demonstrate accurate use of the pulse oximeter.;To learn and demonstrate proper pursed lip breathing techniques or other breathing techniques.     Long  Term Goals Exhibits compliance with exercise, home  and travel O2  prescription;Verbalizes importance of monitoring SPO2 with pulse oximeter and return demonstration;Maintenance of O2 saturations>88%;Exhibits proper breathing techniques, such as pursed lip breathing or other method taught during program session;Compliance with respiratory medication;Demonstrates proper use of MDI's Exhibits compliance with exercise, home  and travel O2 prescription;Verbalizes importance of monitoring SPO2 with pulse oximeter and return demonstration;Maintenance of O2 saturations>88%;Exhibits proper breathing techniques, such as pursed lip breathing or other method taught during program  session;Compliance with respiratory medication;Demonstrates proper use of MDI's Exhibits compliance with exercise, home  and travel O2 prescription;Verbalizes importance of monitoring SPO2 with pulse oximeter and return demonstration;Maintenance of O2 saturations>88%;Exhibits proper breathing techniques, such as pursed lip breathing or other method taught during program session;Compliance with respiratory medication;Demonstrates proper use of MDI's Exhibits compliance with exercise, home  and travel O2 prescription;Verbalizes importance of monitoring SPO2 with pulse oximeter and return demonstration;Maintenance of O2 saturations>88%;Exhibits proper breathing techniques, such as pursed lip breathing or other method taught during program session;Compliance with respiratory medication;Demonstrates proper use of MDI's    Comments -- -- SpO2 is consistently borderline 88-90 RA. We discussed possible need for O2 with increased workloads SpO2 is consistently borderline 88-90 RA. With jogging SpO2 dropped to 86 RA therefore used 2L    Goals/Expected Outcomes Compliance and understanding of oxygen saturation monitoring and breathing techniques to decrease shortness of breath Compliance and understanding of oxygen saturation monitoring and breathing techniques to decrease shortness of breath Compliance and understanding of  oxygen saturation monitoring and breathing techniques to decrease shortness of breath Compliance and understanding of oxygen saturation monitoring and breathing techniques to decrease shortness of breath             Oxygen Discharge (Final Oxygen Re-Evaluation):  Oxygen Re-Evaluation - 02/22/23 0901       Program Oxygen Prescription   Program Oxygen Prescription Continuous    Liters per minute 2    Comments intermittenly needs 2L at higher intensities      Home Oxygen   Home Oxygen Device None    Sleep Oxygen Prescription CPAP    Home Exercise Oxygen Prescription None    Home Resting Oxygen Prescription None    Compliance with Home Oxygen Use No      Goals/Expected Outcomes   Short Term Goals To learn and exhibit compliance with exercise, home and travel O2 prescription;To learn and understand importance of maintaining oxygen saturations>88%;To learn and demonstrate proper use of respiratory medications;To learn and understand importance of monitoring SPO2 with pulse oximeter and demonstrate accurate use of the pulse oximeter.;To learn and demonstrate proper pursed lip breathing techniques or other breathing techniques.     Long  Term Goals Exhibits compliance with exercise, home  and travel O2 prescription;Verbalizes importance of monitoring SPO2 with pulse oximeter and return demonstration;Maintenance of O2 saturations>88%;Exhibits proper breathing techniques, such as pursed lip breathing or other method taught during program session;Compliance with respiratory medication;Demonstrates proper use of MDI's    Comments SpO2 is consistently borderline 88-90 RA. With jogging SpO2 dropped to 86 RA therefore used 2L    Goals/Expected Outcomes Compliance and understanding of oxygen saturation monitoring and breathing techniques to decrease shortness of breath             Initial Exercise Prescription:  Initial Exercise Prescription - 11/29/22 0900       Date of Initial Exercise RX  and Referring Provider   Date 11/29/22    Referring Provider Mannam    Expected Discharge Date 02/23/22      Recumbant Elliptical   Level 2    RPM 55    Watts 80    Minutes 15      Track   Minutes 15    METs 2      Prescription Details   Frequency (times per week) 2    Duration Progress to 30 minutes of continuous aerobic without signs/symptoms of physical distress      Intensity   THRR 40-80% of  Max Heartrate 54-108    Ratings of Perceived Exertion 11-13    Perceived Dyspnea 0-4      Progression   Progression Continue to progress workloads to maintain intensity without signs/symptoms of physical distress.      Resistance Training   Training Prescription Yes    Weight blue bands             Perform Capillary Blood Glucose checks as needed.  Exercise Prescription Changes:   Exercise Prescription Changes     Row Name 12/07/22 1500 12/21/22 1500 01/04/23 1300 01/18/23 1400 02/01/23 0900     Response to Exercise   Blood Pressure (Admit) 104/58 120/62 106/60 104/66 106/60   Blood Pressure (Exercise) 120/60 146/72 166/80 148/60 148/78   Blood Pressure (Exit) 94/52 102/60 114/60 112/70 108/62   Heart Rate (Admit) 71 bpm 77 bpm 85 bpm 85 bpm 67 bpm   Heart Rate (Exercise) 112 bpm 125 bpm 137 bpm  Had pt decrease speed d/t exceeding HR max 123 bpm 121 bpm   Heart Rate (Exit) 81 bpm 90 bpm 93 bpm 97 bpm 1.74 bpm   Oxygen Saturation (Admit) 97 % 94 % 95 % 94 % 98 %   Oxygen Saturation (Exercise) 90 % 90 % 88 % 88 % 88 %   Oxygen Saturation (Exit) 95 % 95 % 93 % 94 % 93 %   Rating of Perceived Exertion (Exercise) 9 11 13 13 13    Perceived Dyspnea (Exercise) 1 1 3 3 3    Duration Continue with 30 min of aerobic exercise without signs/symptoms of physical distress. Continue with 30 min of aerobic exercise without signs/symptoms of physical distress. Continue with 30 min of aerobic exercise without signs/symptoms of physical distress. Continue with 30 min of aerobic exercise  without signs/symptoms of physical distress. Continue with 30 min of aerobic exercise without signs/symptoms of physical distress.   Intensity THRR unchanged THRR unchanged THRR unchanged THRR New  54-145 THRR New     Progression   Progression Continue to progress workloads to maintain intensity without signs/symptoms of physical distress. Continue to progress workloads to maintain intensity without signs/symptoms of physical distress. Continue to progress workloads to maintain intensity without signs/symptoms of physical distress. Continue to progress workloads to maintain intensity without signs/symptoms of physical distress. Continue to progress workloads to maintain intensity without signs/symptoms of physical distress.   Average METs -- -- -- 4.7 --     Resistance Training   Training Prescription Yes Yes Yes Yes Yes   Weight blue bands blue bands black bands black bands black bands   Reps 10-15 10-15 10-15 10-15 10-15   Time 10 Minutes 10 Minutes 10 Minutes 10 Minutes 10 Minutes     Interval Training   Interval Training -- -- -- -- Yes   Equipment -- -- -- -- Treadmill   Comments -- -- -- -- 2.5/1.5 (3.1 Mets) & 3/2 (4 METs)     Treadmill   MPH -- 2.6 2.7 2.7 3   Grade -- 0 0 1.5 2   Minutes -- 15 15 15 15    METs -- 2.99 2.9 3.63 4     Recumbant Elliptical   Level 2 6 7  -- --   RPM 60 -- 61 -- --   Watts -- -- 105 -- --   Minutes 15 15 15  -- --   METs 3.2 4.8 4.5 -- --     Elliptical   Level -- -- -- 1 1   Speed -- -- --  1 1   Minutes -- -- -- 15 15   METs -- -- -- 4.6 4.7     Track   Laps 12 -- -- -- --   Minutes 15 -- -- -- --   METs 2.85 -- -- -- --     Home Exercise Plan   Plans to continue exercise at -- -- -- Home (comment)  walking --   Frequency -- -- -- Add 1 additional day to program exercise sessions. --   Initial Home Exercises Provided -- -- -- 01/18/23 --    Row Name 02/15/23 1400 03/01/23 1500           Response to Exercise   Blood Pressure  (Admit) 107/66 110/58      Blood Pressure (Exercise) 126/58 138/70      Blood Pressure (Exit) 110/58 104/60      Heart Rate (Admit) 69 bpm 71 bpm      Heart Rate (Exercise) 112 bpm 127 bpm      Heart Rate (Exit) 80 bpm 87 bpm      Oxygen Saturation (Admit) 98 % 94 %      Oxygen Saturation (Exercise) 89 % 89 %      Oxygen Saturation (Exit) 95 % 93 %      Rating of Perceived Exertion (Exercise) 14 15      Perceived Dyspnea (Exercise) 2 3      Duration Continue with 30 min of aerobic exercise without signs/symptoms of physical distress. Continue with 30 min of aerobic exercise without signs/symptoms of physical distress.      Intensity THRR New THRR New        Progression   Progression Continue to progress workloads to maintain intensity without signs/symptoms of physical distress. Continue to progress workloads to maintain intensity without signs/symptoms of physical distress.        Resistance Training   Training Prescription Yes Yes      Weight black bands black bands      Reps 10-15 10-15      Time 10 Minutes 10 Minutes        Interval Training   Interval Training Yes Yes      Equipment Treadmill Treadmill      Comments 2 min@ 3.5/1.5& 2min@ 2.5/1.5 46min@2 .5/1.5/3. & 4/1.5/5.        Oxygen   Oxygen -- Intermittent      Liters -- 2        Treadmill   METs 4.4 --        Elliptical   Level 1 1      Speed 1 2      Minutes 15 15      METs 5.1 5.1        Oxygen   Maintain Oxygen Saturation -- 88% or higher               Exercise Comments:   Exercise Comments     Row Name 12/07/22 1508 01/18/23 1503         Exercise Comments Pt completed first day of exercise. Maricela exercised for 15 min on recumbent elliptical and track. Bud averaged 3.2 METs at level 2 on the recumbent elliptical and 2.85 METs on the track. He performed the warmup and cooldown standing without limitations. Discussed METs. Pt voiced understanding. Discussed with pt home exercise  plan. He is active in his yard and walks slowly with his dog but not doing formal exercise at home. Discussed walking 30 min  atleast 1 nonrehab day. He agreed. He also plans to join the gym in January.               Exercise Goals and Review:   Exercise Goals     Row Name 11/29/22 [phone]             Exercise Goals   Increase Physical Activity Yes       Intervention Provide advice, education, support and counseling about physical activity/exercise needs.;Develop an individualized exercise prescription for aerobic and resistive training based on initial evaluation findings, risk stratification, comorbidities and participant's personal goals.       Expected Outcomes Short Term: Attend rehab on a regular basis to increase amount of physical activity.;Long Term: Add in home exercise to make exercise part of routine and to increase amount of physical activity.;Long Term: Exercising regularly at least 3-5 days a week.       Increase Strength and Stamina Yes       Intervention Provide advice, education, support and counseling about physical activity/exercise needs.;Develop an individualized exercise prescription for aerobic and resistive training based on initial evaluation findings, risk stratification, comorbidities and participant's personal goals.       Expected Outcomes Short Term: Increase workloads from initial exercise prescription for resistance, speed, and METs.;Short Term: Perform resistance training exercises routinely during rehab and add in resistance training at home;Long Term: Improve cardiorespiratory fitness, muscular endurance and strength as measured by increased METs and functional capacity ( )       Able to understand and use rate of perceived exertion (RPE) scale Yes       Intervention Provide education and explanation on how to use RPE scale       Expected Outcomes Short Term: Able to use RPE daily in rehab to express subjective intensity level;Long Term:  Able to use RPE  to guide intensity level when exercising independently       Able to understand and use Dyspnea scale Yes       Intervention Provide education and explanation on how to use Dyspnea scale       Expected Outcomes Short Term: Able to use Dyspnea scale daily in rehab to express subjective sense of shortness of breath during exertion;Long Term: Able to use Dyspnea scale to guide intensity level when exercising independently       Knowledge and understanding of Target Heart Rate Range (THRR) Yes       Intervention Provide education and explanation of THRR including how the numbers were predicted and where they are located for reference       Expected Outcomes Short Term: Able to state/look up THRR;Long Term: Able to use THRR to govern intensity when exercising independently;Short Term: Able to use daily as guideline for intensity in rehab       Understanding of Exercise Prescription Yes       Intervention Provide education, explanation, and written materials on patient's individual exercise prescription       Expected Outcomes Short Term: Able to explain program exercise prescription;Long Term: Able to explain home exercise prescription to exercise independently                Exercise Goals Re-Evaluation :  Exercise Goals Re-Evaluation     Row Name 11/29/22 1508 12/28/22 0832 01/20/23 0919 02/22/23 0856       Exercise Goal Re-Evaluation   Exercise Goals Review Increase Physical Activity;Able to understand and use Dyspnea scale;Understanding of Exercise Prescription;Increase Strength and Stamina;Knowledge and understanding of  Target Heart Rate Range (THRR);Able to understand and use rate of perceived exertion (RPE) scale Increase Physical Activity;Able to understand and use Dyspnea scale;Understanding of Exercise Prescription;Increase Strength and Stamina;Knowledge and understanding of Target Heart Rate Range (THRR);Able to understand and use rate of perceived exertion (RPE) scale Increase Physical  Activity;Able to understand and use Dyspnea scale;Understanding of Exercise Prescription;Increase Strength and Stamina;Knowledge and understanding of Target Heart Rate Range (THRR);Able to understand and use rate of perceived exertion (RPE) scale Increase Physical Activity;Able to understand and use Dyspnea scale;Understanding of Exercise Prescription;Increase Strength and Stamina;Knowledge and understanding of Target Heart Rate Range (THRR);Able to understand and use rate of perceived exertion (RPE) scale    Comments Pt is scheduled to begin group exercise 10/29. Will monitor for progression. Pt has completed 6 exercise sessions with perfect attendance. Maninder is exercising for 15 min on recumbent elliptical and treadmill. Briar averages 5.1 METs at level 6 on the recumbent elliptical. He is walking on the treadmill at 2.7 mph, METs 2.9. He is very motivated. He performed the warmup and cooldown standing without limitations.  Will monitor for progression. Pt has completed 11 exercise sessions with perfect attendance. Jearld is exercising for 15 min on upright elliptical and treadmill. He recently moved to the upright elliptical for more progression, speed 1, incline 1 %, METs 4.6.  He then is walking on the treadmill at 2.7 mph, incline 1.5, METs 3.6. He is very motivated and has voiced that he would like to try to jog (former [employer]). We will try intervals on the treadmill. He is using black bands with resistance training. Will monitor for progression. Pt has completed 20 exercise sessions with perfect attendance. Eldwin is exercising for 15 min on upright elliptical and treadmill. He is exercising on the upright elliptical, speed 1, incline 1 %, METs 5.1.  It is challenging for him. He then is walking/jogging on the treadmill in intervals. His jogging interval is 4 mph, 1.5 incline for 30-60 seconds, METs 5.3. His low/walking interval is 2.5 mph, incline 1.5, METs 3.1, for 2 min. Jidenna is very motivated and happy to  achieve a form of jogging again. He is using 7.3 lb black bands with resistance training. Will continue to progress pt.    Expected Outcomes Through exercise at rehab and at home, the patient will decrease shortness of breath with daily activities and feel confident in carrying out an exercise regime at home. Through exercise at rehab and at home, the patient will decrease shortness of breath with daily activities and feel confident in carrying out an exercise regime at home. Through exercise at rehab and at home, the patient will decrease shortness of breath with daily activities and feel confident in carrying out an exercise regime at home. Through exercise at rehab and at home, the patient will decrease shortness of breath with daily activities and feel confident in carrying out an exercise regime at home.             Discharge Exercise Prescription (Final Exercise Prescription Changes):  Exercise Prescription Changes - 03/01/23 1500       Response to Exercise   Blood Pressure (Admit) 110/58    Blood Pressure (Exercise) 138/70    Blood Pressure (Exit) 104/60    Heart Rate (Admit) 71 bpm    Heart Rate (Exercise) 127 bpm    Heart Rate (Exit) 87 bpm    Oxygen Saturation (Admit) 94 %    Oxygen Saturation (Exercise) 89 %    Oxygen Saturation (  Exit) 93 %    Rating of Perceived Exertion (Exercise) 15    Perceived Dyspnea (Exercise) 3    Duration Continue with 30 min of aerobic exercise without signs/symptoms of physical distress.    Intensity THRR New      Progression   Progression Continue to progress workloads to maintain intensity without signs/symptoms of physical distress.      Resistance Training   Training Prescription Yes    Weight black bands    Reps 10-15    Time 10 Minutes      Interval Training   Interval Training Yes    Equipment Treadmill    Comments 49min@2 .5/1.5/3. & 4/1.5/5.      Oxygen   Oxygen Intermittent    Liters 2      Elliptical   Level  1    Speed 2    Minutes 15    METs 5.1      Oxygen   Maintain Oxygen Saturation 88% or higher             Nutrition:  Target Goals: Understanding of nutrition guidelines, daily intake of sodium 1500mg , cholesterol 200mg , calories 30% from fat and 7% or less from saturated fats, daily to have 5 or more servings of fruits and vegetables.  Biometrics:  Pre Biometrics - 11/29/22 0828       Pre Biometrics   Grip Strength 30 kg              Nutrition Therapy Plan and Nutrition Goals:  Nutrition Therapy & Goals - 01/04/23 1529       Nutrition Therapy   Diet Heart Healthy Diet    Drug/Food Interactions Statins/Certain Fruits      Personal Nutrition Goals   Nutrition Goal Patient to improve diet quality by using the plate method as a guide for meal planning to include lean protein/plant protein, fruits, vegetables, whole grains, nonfat dairy as part of a well-balanced diet.   goal in progress.   Comments Wahab has medical history of NSTEMI, hyperlipidemia, sleep apnea, hx of TIA, ILD, afib ablation july 2024. His LDL is at goal. He reports no nutrition concerns/goals at this time. He reports eating a wide variety of foods, two meals daily, and stable appetite. He has maintained his weight since starting with our program.His wife is a good support.  Patient will benefit from participation in pulmonary rehab for nutrition, exercise, and lifestyle modification.      Intervention Plan   Intervention Prescribe, educate and counsel regarding individualized specific dietary modifications aiming towards targeted core components such as weight, hypertension, lipid management, diabetes, heart failure and other comorbidities.;Nutrition handout(s) given to patient.    Expected Outcomes Short Term Goal: Understand basic principles of dietary content, such as calories, fat, sodium, cholesterol and nutrients.;Long Term Goal: Adherence to prescribed nutrition plan.             Nutrition  Assessments:  Nutrition Assessments - 12/07/22 1533       Rate Your Plate Scores   Pre Score 49            MEDIFICTS Score Key: >=70 Need to make dietary changes  40-70 Heart Healthy Diet <= 40 Therapeutic Level Cholesterol Diet  Flowsheet Row PULMONARY REHAB OTHER RESPIRATORY from 12/07/2022 in [facility] Heart, Vascular, & Lung Health  Picture Your Plate Total Score on Admission 49      Picture Your Plate Scores: <16 Unhealthy dietary pattern with much room for  improvement. 41-50 Dietary pattern unlikely to meet recommendations for good health and room for improvement. 51-60 More healthful dietary pattern, with some room for improvement.  >60 Healthy dietary pattern, although there may be some specific behaviors that could be improved.    Nutrition Goals Re-Evaluation:  Nutrition Goals Re-Evaluation     Row Name 12/07/22 1343 01/04/23 1529           Goals   Current Weight 191 lb 5.8 oz (86.8 kg) 190 lb 7.6 oz (86.4 kg)      Comment LDL 55, HDL 38, GFR 44, HDL 38 no new labs; most recent labs  LDL 55, HDL 38, GFR 44, HDL 38      Expected Outcome [place] has medical history of NSTEMI, hyperlipidemia, sleep apnea, hx of TIA, ILD, afib ablation july 2024. His LDL is at goal. He reports no nutrition concerns/goals at this time. He reports eating a wide variety of foods, two meals daily, and stable appetite. His wife is a good support. Patient will benefit from participation in pulmonary rehab for nutrition, exercise, and lifestyle modification. Lugene has medical history of NSTEMI, hyperlipidemia, sleep apnea, hx of TIA, ILD, afib ablation july 2024. His LDL is at goal. He reports no nutrition concerns/goals at this time. He reports eating a wide variety of foods, two meals daily, and stable appetite. He has maintained his weight since starting with our program.His wife is a good support. Patient will benefit from participation in pulmonary rehab for  nutrition, exercise, and lifestyle modification.               Nutrition Goals Discharge (Final Nutrition Goals Re-Evaluation):  Nutrition Goals Re-Evaluation - 01/04/23 1529       Goals   Current Weight 190 lb 7.6 oz (86.4 kg)    Comment no new labs; most recent labs  LDL 55, HDL 38, GFR 44, HDL 38    Expected Outcome [place] has medical history of NSTEMI, hyperlipidemia, sleep apnea, hx of TIA, ILD, afib ablation july 2024. His LDL is at goal. He reports no nutrition concerns/goals at this time. He reports eating a wide variety of foods, two meals daily, and stable appetite. He has maintained his weight since starting with our program.His wife is a good support. Patient will benefit from participation in pulmonary rehab for nutrition, exercise, and lifestyle modification.             Psychosocial: Target Goals: Acknowledge presence or absence of significant depression and/or stress, maximize coping skills, provide positive support system. Participant is able to verbalize types and ability to use techniques and skills needed for reducing stress and depression.  Initial Review & Psychosocial Screening:  Initial Psych Review & Screening - 11/29/22 0834       Initial Review   Current issues with None Identified      Family Dynamics   Good Support System? Yes    Comments spouse      Barriers   Psychosocial barriers to participate in program There are no identifiable barriers or psychosocial needs.      Screening Interventions   Interventions Encouraged to exercise             Quality of Life Scores:  Scores of 19 and below usually indicate a poorer quality of life in these areas.  A difference of  2-3 points is a clinically meaningful difference.  A difference of 2-3 points in the total score of the Quality of Life Index has been associated  with significant improvement in overall quality of life, self-image, physical symptoms, and general health in studies assessing  change in quality of life.  PHQ-9: Review Flowsheet  More data exists      11/29/2022 06/01/2017 05/04/2017 10/07/2016 12/24/2014  Depression screen PHQ 2/9  Decreased Interest 0 0 0 0 0  Down, Depressed, Hopeless 0 0 0 0 0  PHQ - 2 Score 0 0 0 0 0  Altered sleeping 0 - - - -  Tired, decreased energy 0 - - - -  Change in appetite 0 - - - -  Feeling bad or failure about yourself  0 - - - -  Trouble concentrating 0 - - - -  Moving slowly or fidgety/restless 0 - - - -  Suicidal thoughts 0 - - - -  PHQ-9 Score 0 - - - -  Difficult doing work/chores Not difficult at all - - - -   Interpretation of Total Score  Total Score Depression Severity:  1-4 = Minimal depression, 5-9 = Mild depression, 10-14 = Moderate depression, 15-19 = Moderately severe depression, 20-27 = Severe depression   Psychosocial Evaluation and Intervention:  Psychosocial Evaluation - 11/29/22 0834       Psychosocial Evaluation & Interventions   Interventions Encouraged to exercise with the program and follow exercise prescription    Comments Pt denies any psychosocial needs at this time.    Expected Outcomes For Michaelpaul to participate in rehab free of psychosocial barriers.    Continue Psychosocial Services  No Follow up required             Psychosocial Re-Evaluation:  Psychosocial Re-Evaluation     Row Name 12/03/22 1018 12/31/22 0955 01/24/23 1220 02/21/23 0919       Psychosocial Re-Evaluation   Current issues with None Identified None Identified None Identified None Identified    Comments No changes in prior assessment. Manrique is scheduled to start the program on 12/07/22. No changes in monthly re-assessment. Nic denies any psychosocial needs at this time. He states he has great support from his wife and family. No changes in monthly re-assessment. Kinsley denies any psychosocial needs at this time. He states he has great support from his wife and family. No changes in monthly re-assessment. Lenox denies any  psychosocial needs at this time. He states he has great support from his wife and family.    Expected Outcomes For Maleko to participate in rehab free of psychosocial barriers. For Shishir to participate in rehab free of psychosocial barriers. For Trequan to participate in rehab free of psychosocial barriers or concerns For Zachari to participate in rehab free of psychosocial barriers or concerns    Interventions Encouraged to attend Pulmonary Rehabilitation for the exercise Encouraged to attend Pulmonary Rehabilitation for the exercise Encouraged to attend Pulmonary Rehabilitation for the exercise Encouraged to attend Pulmonary Rehabilitation for the exercise    Continue Psychosocial Services  No Follow up required No Follow up required No Follow up required No Follow up required             Psychosocial Discharge (Final Psychosocial Re-Evaluation):  Psychosocial Re-Evaluation - 02/21/23 0919       Psychosocial Re-Evaluation   Current issues with None Identified    Comments No changes in monthly re-assessment. Kevon denies any psychosocial needs at this time. He states he has great support from his wife and family.    Expected Outcomes For Davin to participate in rehab free of psychosocial barriers or concerns  Interventions Encouraged to attend Pulmonary Rehabilitation for the exercise    Continue Psychosocial Services  No Follow up required             Education: Education Goals: Education classes will be provided on a weekly basis, covering required topics. Participant will state understanding/return demonstration of topics presented.  Learning Barriers/Preferences:  Learning Barriers/Preferences - 11/29/22 0835       Learning Barriers/Preferences   Learning Barriers Hearing    Learning Preferences Skilled Demonstration             Education Topics: Know Your Numbers Group instruction that is supported by a PowerPoint presentation. Instructor discusses importance of knowing  and understanding resting, exercise, and post-exercise oxygen saturation, heart rate, and blood pressure. Oxygen saturation, heart rate, blood pressure, rating of perceived exertion, and dyspnea are reviewed along with a normal range for these values.  Flowsheet Row PULMONARY REHAB OTHER RESPIRATORY from 02/10/2023 in [facility] for Heart, Vascular, & Lung Health  Date 02/10/23  Educator EP  Instruction Review Code 1- Verbalizes Understanding       Exercise for the Pulmonary Patient Group instruction that is supported by a PowerPoint presentation. Instructor discusses benefits of exercise, core components of exercise, frequency, duration, and intensity of an exercise routine, importance of utilizing pulse oximetry during exercise, safety while exercising, and options of places to exercise outside of rehab.  Flowsheet Row PULMONARY REHAB OTHER RESPIRATORY from 02/03/2023 in [facility] for Heart, Vascular, & Lung Health  Date 02/03/23  Educator EP  Instruction Review Code 1- Verbalizes Understanding       MET Level  Group instruction provided by PowerPoint, verbal discussion, and written material to support subject matter. Instructor reviews what METs are and how to increase METs.    Pulmonary Medications Verbally interactive group education provided by instructor with focus on inhaled medications and proper administration. Flowsheet Row PULMONARY REHAB OTHER RESPIRATORY from 01/27/2023 in [facility] for Heart, Vascular, & Lung Health  Date 01/27/23  Educator RT  Instruction Review Code 1- Verbalizes Understanding       Anatomy and Physiology of the Respiratory System Group instruction provided by PowerPoint, verbal discussion, and written material to support subject matter. Instructor reviews respiratory cycle and anatomical components of the respiratory system and their functions. Instructor also reviews  differences in obstructive and restrictive respiratory diseases with examples of each.  Flowsheet Row PULMONARY REHAB OTHER RESPIRATORY from 01/20/2023 in [facility] for Heart, Vascular, & Lung Health  Date 01/20/23  Educator RT  Instruction Review Code 1- Verbalizes Understanding       Oxygen Safety Group instruction provided by PowerPoint, verbal discussion, and written material to support subject matter. There is an overview of "What is Oxygen" and "Why do we need it".  Instructor also reviews how to create a safe environment for oxygen use, the importance of using oxygen as prescribed, and the risks of noncompliance. There is a brief discussion on traveling with oxygen and resources the patient may utilize. Flowsheet Row PULMONARY REHAB OTHER RESPIRATORY from 02/17/2023 in [facility] for Heart, Vascular, & Lung Health  Date 02/17/23  Educator RN  Instruction Review Code 1- Verbalizes Understanding       Oxygen Use Group instruction provided by PowerPoint, verbal discussion, and written material to discuss how supplemental oxygen is prescribed and different types of oxygen supply systems. Resources for more information are provided.  Flowsheet  Row PULMONARY REHAB OTHER RESPIRATORY from 02/24/2023 in [facility] for Heart, Vascular, & Lung Health  Date 02/24/23  Educator RT  Instruction Review Code 1- Verbalizes Understanding       Breathing Techniques Group instruction that is supported by demonstration and informational handouts. Instructor discusses the benefits of pursed lip and diaphragmatic breathing and detailed demonstration on how to perform both.     Risk Factor Reduction Group instruction that is supported by a PowerPoint presentation. Instructor discusses the definition of a risk factor, different risk factors for pulmonary disease, and how the heart and lungs work together. Flowsheet Row PULMONARY  REHAB OTHER RESPIRATORY from 12/23/2022 in [facility] for Heart, Vascular, & Lung Health  Date 12/23/22  Educator EP  Instruction Review Code 1- Verbalizes Understanding       Pulmonary Diseases Group instruction provided by PowerPoint, verbal discussion, and written material to support subject matter. Instructor gives an overview of the different type of pulmonary diseases. There is also a discussion on risk factors and symptoms as well as ways to manage the diseases. Flowsheet Row PULMONARY REHAB OTHER RESPIRATORY from 01/13/2023 in [facility] for Heart, Vascular, & Lung Health  Date 01/13/23  Educator RT  Instruction Review Code 1- Verbalizes Understanding       Stress and Energy Conservation Group instruction provided by PowerPoint, verbal discussion, and written material to support subject matter. Instructor gives an overview of stress and the impact it can have on the body. Instructor also reviews ways to reduce stress. There is also a discussion on energy conservation and ways to conserve energy throughout the day. Flowsheet Row PULMONARY REHAB OTHER RESPIRATORY from 12/09/2022 in [facility] for Heart, Vascular, & Lung Health  Date 12/09/22  Educator Rn  Instruction Review Code 1- Verbalizes Understanding       Warning Signs and Symptoms Group instruction provided by PowerPoint, verbal discussion, and written material to support subject matter. Instructor reviews warning signs and symptoms of stroke, heart attack, cold and flu. Instructor also reviews ways to prevent the spread of infection. Flowsheet Row PULMONARY REHAB OTHER RESPIRATORY from 12/16/2022 in [facility] for Heart, Vascular, & Lung Health  Date 12/16/22  Educator RN  Instruction Review Code 1- Verbalizes Understanding       Other Education Group or individual verbal, written, or video instructions that support  the educational goals of the pulmonary rehab program.    Knowledge Questionnaire Score:  Knowledge Questionnaire Score - 11/29/22 0836       Knowledge Questionnaire Score   Pre Score 16/18             Core Components/Risk Factors/Patient Goals at Admission:  Personal Goals and Risk Factors at Admission - 11/29/22 0835       Core Components/Risk Factors/Patient Goals on Admission    Weight Management Weight Loss;Yes    Intervention Weight Management: Develop a combined nutrition and exercise program designed to reach desired caloric intake, while maintaining appropriate intake of nutrient and fiber, sodium and fats, and appropriate energy expenditure required for the weight goal.;Weight Management: Provide education and appropriate resources to help participant work on and attain dietary goals.;Weight Management/Obesity: Establish reasonable short term and long term weight goals.;Obesity: Provide education and appropriate resources to help participant work on and attain dietary goals.    Expected Outcomes Short Term: Continue to assess and modify interventions until short term weight is achieved;Long Term: Adherence  to nutrition and physical activity/exercise program aimed toward attainment of established weight goal;Weight Maintenance: Understanding of the daily nutrition guidelines, which includes 25-35% calories from fat, 7% or less cal from saturated fats, less than 200mg  cholesterol, less than 1.5gm of sodium, & 5 or more servings of fruits and vegetables daily;Weight Loss: Understanding of general recommendations for a balanced deficit meal plan, which promotes 1-2 lb weight loss per week and includes a negative energy balance of [phone] kcal/d;Understanding of distribution of calorie intake throughout the day with the consumption of 4-5 meals/snacks;Understanding recommendations for meals to include 15-35% energy as protein, 25-35% energy from fat, 35-60% energy from carbohydrates, less  than 200mg  of dietary cholesterol, 20-35 gm of total fiber daily    Improve shortness of breath with ADL's Yes    Intervention Provide education, individualized exercise plan and daily activity instruction to help decrease symptoms of SOB with activities of daily living.    Expected Outcomes Short Term: Improve cardiorespiratory fitness to achieve a reduction of symptoms when performing ADLs;Long Term: Be able to perform more ADLs without symptoms or delay the onset of symptoms             Core Components/Risk Factors/Patient Goals Review:   Goals and Risk Factor Review     Row Name 12/03/22 1019 12/31/22 0956 01/24/23 1221 02/21/23 0920       Core Components/Risk Factors/Patient Goals Review   Personal Goals Review Weight Management/Obesity;Improve shortness of breath with ADL's;Develop more efficient breathing techniques such as purse lipped breathing and diaphragmatic breathing and practicing self-pacing with activity. Weight Management/Obesity;Improve shortness of breath with ADL's;Develop more efficient breathing techniques such as purse lipped breathing and diaphragmatic breathing and practicing self-pacing with activity. Weight Management/Obesity;Improve shortness of breath with ADL's;Develop more efficient breathing techniques such as purse lipped breathing and diaphragmatic breathing and practicing self-pacing with activity. Weight Management/Obesity;Improve shortness of breath with ADL's    Review No changes from orientation. Chioke is scheduled to start the program on 12/07/22. Core components/risk factors/patient goals monthly re-evaluate are as follows: Adewale has not met his goal to lose any weight this month. He is up in weight from starting the program. He reports eating 2 meals a day and eating a wide variety of foods. Bernhardt has been open to suggestions from our dietitian but hasn't implanted suggestions yet. We will continue to support Andrej in his goal to lose weight. Goal in progress  on improving SOB with ADLs and develop more efficient breathing techniques such as purse lipped breathing and diaphragmatic breathing; and practicing self-pacing with activity. Riaz is learning on how to initiate these techniques before becoming severely short of breath or tired. 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 Lark has been quickly increasing both his workload and METs and has been exercising here with great motivation to get to where he once was. With supervision of staff, he has been making excellent progress and we will continue to monitor and evaluate. He will continue to benefit from participation in PR for nutrition, education, exercise, and lifestyle modification. Core components/risk factors/patient goals monthly re-evaluate are as follows: Metin has not met his goal to lose any weight this month. He has maintained his weight since starting the program. He reports eating 2 meals a day and eating a wide variety of foods. Ismail has been open to suggestions from our dietitian but hasn't made any changes yet. We will continue to support Marcus in his goal to lose weight. Goal in progress on improving  SOB with ADLs. Markes can report his rate of perceived exertion and dyspnea level accurately. He has been able to maintain his oxygen saturation on room air. Goal met for developing more efficient breathing techniques such as purse lipped breathing and diaphragmatic breathing; and practicing self-pacing with activity. Raynald can initiate these techniques before becoming severely short of breath or tired. 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 Nikitas has been quickly increasing both his workload and METs and has been exercising here with great motivation to get to where he once was. With supervision of staff, he has been making excellent progress and will start running on the treadmill this week. Jahsiah will continue to benefit from participation in PR for nutrition, education, exercise, and lifestyle  modification. Core components/risk factors/patient goals monthly re-evaluate are as follows: Thurmon has not met his goal to lose any weight this month. He has maintained his weight since starting the program. He reports eating 2 meals a day and eating a wide variety of foods. Reynel has been open to suggestions from our dietitian but hasn't made any changes yet. We will continue to support Casanova in his goal to lose weight. Goal in progress on improving SOB with ADLs. Sohaib can report his rate of perceived exertion and dyspnea level accurately. He has been able to maintain his oxygen saturation on room air but might need oxygen while running on the treadmill. His oxygen saturation was borderline last session, this is the first time he ran on the treadmill. Rogelio has been quickly increasing both his workload and METs and has been exercising here with great motivation to get to where he once was. With supervision of staff, he has been making excellent progress and will start running on the treadmill this week. Ceejay will continue to benefit from participation in PR for nutrition, education, exercise, and lifestyle modification.    Expected Outcomes For Reed to lose weight, improve his SOB with ADLs, and develop more efficient breathing techniques such as purse lipped breathing and diaphragmatic breathing; and practicing self-pacing with activity For Derl to lose weight, improve his SOB with ADLs, and develop more efficient breathing techniques such as purse lipped breathing and diaphragmatic breathing; and practicing self-pacing with activity For Talmage to lose weight and improve his SOB with ADLs For Timmey to lose weight and improve his SOB with ADLs             Core Components/Risk Factors/Patient Goals at Discharge (Final Review):   Goals and Risk Factor Review - 02/21/23 0920       Core Components/Risk Factors/Patient Goals Review   Personal Goals Review Weight Management/Obesity;Improve shortness of breath with  ADL's    Review Core components/risk factors/patient goals monthly re-evaluate are as follows: St has not met his goal to lose any weight this month. He has maintained his weight since starting the program. He reports eating 2 meals a day and eating a wide variety of foods. Sparsh has been open to suggestions from our dietitian but hasn't made any changes yet. We will continue to support Jeret in his goal to lose weight. Goal in progress on improving SOB with ADLs. Landen can report his rate of perceived exertion and dyspnea level accurately. He has been able to maintain his oxygen saturation on room air but might need oxygen while running on the treadmill. His oxygen saturation was borderline last session, this is the first time he ran on the treadmill. Nevo has been quickly increasing both his workload  and METs and has been exercising here with great motivation to get to where he once was. With supervision of staff, he has been making excellent progress and will start running on the treadmill this week. Lovett will continue to benefit from participation in PR for nutrition, education, exercise, and lifestyle modification.    Expected Outcomes For Tarez to lose weight and improve his SOB with ADLs             ITP Comments:Pt is making expected progress toward Pulmonary Rehab goals after completing 23 session(s). Recommend continued exercise, life style modification, education, and utilization of breathing techniques to increase stamina and strength, while also decreasing shortness of breath with exertion.  Dr. Mechele Collin is Medical Director for Pulmonary Rehab at [facility] [place].

## 2023-03-03 ENCOUNTER — Encounter (HOSPITAL_COMMUNITY)
Admission: RE | Admit: 2023-03-03 | Discharge: 2023-03-03 | Disposition: A | Discharge status: Home / Self Care | Payer: Medicare Other | Source: Ambulatory Visit | Attending: Pulmonary Disease | Admitting: Pulmonary Disease

## 2023-03-03 DIAGNOSIS — J849 Interstitial pulmonary disease, unspecified: Secondary | ICD-10-CM | POA: Diagnosis not present

## 2023-03-03 NOTE — Progress Notes (Signed)
Daily Session Note  Patient Details  Name: Charles Wright MRN: [id number] Date of Birth: 20-Jan-1936 Referring Provider:   Doristine Devoid Pulmonary Rehab Walk Test from 11/29/2022 in [facility] for Heart, Vascular, & Lung Health  Referring Provider Mannam       Encounter Date: 03/03/2023  Check In:  Session Check In - 03/03/23 1326       Check-In   Supervising physician immediately available to respond to emergencies [facility] MD immediately available    Physician(s) Robin Searing, NP    Location [facility]    Staff Present Essie Hart, RN, BSN;Copeland Neisen Charlean Sanfilippo, MS, ACSM-CEP, Exercise Physiologist    Virtual Visit No    Medication changes reported     No    Fall or balance concerns reported    No    Tobacco Cessation No Change    Warm-up and Cool-down Performed as group-led instruction    Resistance Training Performed Yes    VAD Patient? No    PAD/SET Patient? No      Pain Assessment   Currently in Pain? No/denies    Multiple Pain Sites No             Capillary Blood Glucose: No results found for this or any previous visit (from the past 24 hours).    Social History   Tobacco Use  Smoking Status Never  Smokeless Tobacco Never    Goals Met:  Proper associated with RPD/PD & O2 Sat Independence with exercise equipment Exercise tolerated well No report of concerns or symptoms today Strength training completed today  Goals Unmet:  Not Applicable  Comments: Service time is from 1308 to 1438.    Dr. Mechele Collin is Medical Director for Pulmonary Rehab at [facility].

## 2023-03-07 ENCOUNTER — Telehealth: Payer: Self-pay

## 2023-03-07 DIAGNOSIS — J849 Interstitial pulmonary disease, unspecified: Secondary | ICD-10-CM

## 2023-03-07 NOTE — Telephone Encounter (Signed)
Received New start paperwork for OFEV. Will update as we work through the benefits process.  Submitted a Prior Authorization request to [facility] for OFEV via CoverMyMeds. Will update once we receive a response.  Key: [id number]

## 2023-03-08 ENCOUNTER — Encounter (HOSPITAL_COMMUNITY)
Admission: RE | Admit: 2023-03-08 | Discharge: 2023-03-08 | Disposition: A | Discharge status: Home / Self Care | Payer: Medicare Other | Source: Ambulatory Visit | Attending: Pulmonary Disease | Admitting: Pulmonary Disease

## 2023-03-08 ENCOUNTER — Other Ambulatory Visit (HOSPITAL_COMMUNITY): Payer: Self-pay

## 2023-03-08 DIAGNOSIS — J849 Interstitial pulmonary disease, unspecified: Secondary | ICD-10-CM | POA: Diagnosis not present

## 2023-03-08 NOTE — Telephone Encounter (Signed)
Received notification from [facility] regarding a prior authorization for OFEV. Authorization has been APPROVED from 03/08/2023 to 03/06/2024. Approval letter sent to scan center.  Per test claim, copay for 30 days supply is $1272.66  Submitted Patient Assistance Application to [facility] for OFEV along with provider portion, patient portion, PA, medication list, insurance card copy. Will update patient when we receive a response.  Phone #: [phone] Fax #: [phone]  Chesley Mires, PharmD, MPH, BCPS, CPP Clinical Pharmacist (Rheumatology and Pulmonology)

## 2023-03-08 NOTE — Progress Notes (Signed)
Daily Session Note  Patient Details  Name: Charles Wright MRN: [id number] Date of Birth: 1935/10/28 Referring Provider:   Doristine Devoid Pulmonary Rehab Walk Test from 11/29/2022 in [facility] for Heart, Vascular, & Lung Health  Referring Provider Mannam       Encounter Date: 03/08/2023  Check In:  Session Check In - 03/08/23 1451       Check-In   Supervising physician immediately available to respond to emergencies [facility] immediately available    Physician(s) Tereso Newcomer, NP    [facility]    Staff Present Essie Hart, RN, BSN;Santiago Graf Charlean Sanfilippo, MS, ACSM-CEP, Exercise Physiologist    Virtual Visit No    Medication changes reported     No    Fall or balance concerns reported    No    Tobacco Cessation No Change    Warm-up and Cool-down Performed as group-led instruction    Resistance Training Performed Yes    VAD Patient? No    PAD/SET Patient? No      Pain Assessment   Currently in Pain? No/denies    Multiple Pain Sites No             Capillary Blood Glucose: No results found for this or any previous visit (from the past 24 hours).    Social History   Tobacco Use  Smoking Status Never  Smokeless Tobacco Never    Goals Met:  Proper associated with RPD/PD & O2 Sat Independence with exercise equipment Exercise tolerated well No report of concerns or symptoms today Strength training completed today  Goals Unmet:  Not Applicable  Comments: Service time is from 1309 to 1435.    Dr. Mechele Collin is Medical Director for Pulmonary Rehab at [facility].

## 2023-03-10 ENCOUNTER — Encounter (HOSPITAL_COMMUNITY)
Admission: RE | Admit: 2023-03-10 | Discharge: 2023-03-10 | Disposition: A | Discharge status: Home / Self Care | Payer: Medicare Other | Source: Ambulatory Visit | Attending: Pulmonary Disease | Admitting: Pulmonary Disease

## 2023-03-10 DIAGNOSIS — J849 Interstitial pulmonary disease, unspecified: Secondary | ICD-10-CM

## 2023-03-10 NOTE — Progress Notes (Signed)
Daily Session Note  Patient Details  Name: Charles Wright MRN: [id number] Date of Birth: 08-21-1935 Referring Provider:   Doristine Devoid Pulmonary Rehab Walk Test from 11/29/2022 in [facility] for Heart, Vascular, & Lung Health  Referring Provider Mannam       Encounter Date: 03/10/2023  Check In:  Session Check In - 03/10/23 1323       Check-In   Supervising physician immediately available to respond to emergencies [facility] MD immediately available    Physician(s) Edd Fabian, NP    Location [facility]    Staff Present Essie Hart, RN, BSN;Casey Katrinka Blazing, Zella Richer, MS, ACSM-CEP, Exercise Physiologist;Randi Dionisio Paschal, ACSM-CEP, Exercise Physiologist    Virtual Visit No    Medication changes reported     No    Fall or balance concerns reported    No    Tobacco Cessation No Change    Warm-up and Cool-down Performed as group-led instruction    Resistance Training Performed Yes    VAD Patient? No    PAD/SET Patient? No      Pain Assessment   Currently in Pain? No/denies    Multiple Pain Sites No             Capillary Blood Glucose: No results found for this or any previous visit (from the past 24 hours).    Social History   Tobacco Use  Smoking Status Never  Smokeless Tobacco Never    Goals Met:  Independence with exercise equipment Exercise tolerated well No report of concerns or symptoms today Strength training completed today  Goals Unmet:  Not Applicable  Comments: Service time is from 1305 to 1434    Dr. Mechele Collin is Medical Director for Pulmonary Rehab at [facility].

## 2023-03-15 ENCOUNTER — Encounter (HOSPITAL_COMMUNITY)
Admission: RE | Admit: 2023-03-15 | Discharge: 2023-03-15 | Disposition: A | Discharge status: Home / Self Care | Payer: Medicare Other | Source: Ambulatory Visit | Attending: Pulmonary Disease | Admitting: Pulmonary Disease

## 2023-03-15 VITALS — Wt 188.7 lb

## 2023-03-15 DIAGNOSIS — J849 Interstitial pulmonary disease, unspecified: Secondary | ICD-10-CM | POA: Diagnosis not present

## 2023-03-15 NOTE — Progress Notes (Signed)
Daily Session Note  Patient Details  Name: Charles Wright MRN: [id number] Date of Birth: August 09, 1935 Referring Provider:   Doristine Devoid Pulmonary Rehab Walk Test from 11/29/2022 in [facility] for Heart, Vascular, & Lung Health  Referring Provider Mannam       Encounter Date: 03/15/2023  Check In:  Session Check In - 03/15/23 1457       Check-In   Supervising physician immediately available to respond to emergencies [facility] MD immediately available    Physician(s) Edd Fabian, NP    Location [facility]    Staff Present Essie Hart, RN, BSN;Yaminah Clayborn Katrinka Blazing, Zella Richer, MS, ACSM-CEP, Exercise Physiologist;Randi Idelle Crouch BS, ACSM-CEP, Exercise Physiologist    Virtual Visit No    Medication changes reported     No    Fall or balance concerns reported    No    Tobacco Cessation No Change    Warm-up and Cool-down Performed as group-led instruction    Resistance Training Performed Yes    VAD Patient? No    PAD/SET Patient? No      Pain Assessment   Currently in Pain? No/denies    Multiple Pain Sites No             Capillary Blood Glucose: No results found for this or any previous visit (from the past 24 hours).   Exercise Prescription Changes - 03/15/23 1400       Response to Exercise   Blood Pressure (Admit) 104/60    Blood Pressure (Exit) 106/60    Heart Rate (Admit) 68 bpm    Heart Rate (Exercise) 115 bpm    Heart Rate (Exit) 1.77 bpm    Oxygen Saturation (Admit) 96 %    Oxygen Saturation (Exercise) 89 %    Oxygen Saturation (Exit) 93 %    Rating of Perceived Exertion (Exercise) 13    Perceived Dyspnea (Exercise) 2    Duration Continue with 30 min of aerobic exercise without signs/symptoms of physical distress.    Intensity THRR New      Progression   Progression Continue to progress workloads to maintain intensity without signs/symptoms of physical distress.      Resistance Training   Training Prescription Yes    Weight  black bands    Reps 10-15    Time 10 Minutes      Interval Training   Interval Training Yes    Equipment Treadmill    Comments 69min@2 .5/1.5/3. & 4/1.5/5.      Oxygen   Oxygen Intermittent    Liters 0-2      Elliptical   Level 1    Speed 3    Minutes 15    METs 5.2      Oxygen   Maintain Oxygen Saturation 88% or higher             Social History   Tobacco Use  Smoking Status Never  Smokeless Tobacco Never    Goals Met:  Proper associated with RPD/PD & O2 Sat Independence with exercise equipment Exercise tolerated well No report of concerns or symptoms today Strength training completed today  Goals Unmet:  Not Applicable  Comments: Service time is from 1314 to 1439.    Dr. Mechele Collin is Medical Director for Pulmonary Rehab at [facility].

## 2023-03-17 ENCOUNTER — Encounter (HOSPITAL_COMMUNITY)
Admission: RE | Admit: 2023-03-17 | Discharge: 2023-03-17 | Disposition: A | Discharge status: Home / Self Care | Payer: Medicare Other | Source: Ambulatory Visit | Attending: Pulmonary Disease | Admitting: Pulmonary Disease

## 2023-03-17 ENCOUNTER — Telehealth: Payer: Self-pay | Admitting: Internal Medicine

## 2023-03-17 DIAGNOSIS — J849 Interstitial pulmonary disease, unspecified: Secondary | ICD-10-CM

## 2023-03-17 NOTE — Telephone Encounter (Signed)
 Received fax from [facility] stating patient's proof of income is missing. They will need this by 90 days from 03/09/2023. ATC patient - unable to reach. Left detailed VM with ym callback number if needed  Patient ID: [id number]  Sherry Pennant, PharmD, MPH, BCPS, CPP Clinical Pharmacist (Rheumatology and Pulmonology)

## 2023-03-17 NOTE — Telephone Encounter (Signed)
 Received a pt message via pt schedule stating: I am completing pulmonary rehab on Feb 13. They strongly recommend continuing my work outs. The recommend oxygen  when I am strenuously exercising. Dr. Forrestine suggested medication to slow down pulmonary fibrosis and I have been approved to start the treatments. I need to review my heart condition before starting any treatments and I have some new issues that have developed  An appt has been setup for Dr. Katharina first available at this time 03/06.  Please advise.

## 2023-03-17 NOTE — Progress Notes (Signed)
 Daily Session Note  Patient Details  Name: Charles Wright MRN: [id number] Date of Birth: May 12, 1935 Referring Provider:   Conrad Ports Pulmonary Rehab Walk Test from 11/29/2022 in [facility] for Heart, Vascular, & Lung Health  Referring Provider Mannam       Encounter Date: 03/17/2023  Check In:  Session Check In - 03/17/23 1327       Check-In   Supervising physician immediately available to respond to emergencies [facility] immediately available    Physician(s) Rosabel Mose, NP    Location [facility]    Staff Present Ronal Levin, RN, BSN;Casey Claudene, Neita Moats, MS, ACSM-CEP, Exercise Physiologist;Charlane Westry Midge BS, ACSM-CEP, Exercise Physiologist    Virtual Visit No    Medication changes reported     No    Fall or balance concerns reported    No    Tobacco Cessation No Change    Warm-up and Cool-down Performed as group-led instruction    Resistance Training Performed Yes    VAD Patient? No    PAD/SET Patient? No      Pain Assessment   Currently in Pain? No/denies    Multiple Pain Sites No             Capillary Blood Glucose: No results found for this or any previous visit (from the past 24 hours).    Social History   Tobacco Use  Smoking Status Never  Smokeless Tobacco Never    Goals Met:  Independence with exercise equipment Exercise tolerated well No report of concerns or symptoms today Strength training completed today  Goals Unmet:  Not Applicable  Comments: Service time is from 1308 to 1440.    Dr. Slater Staff is Medical Director for Pulmonary Rehab at [facility].

## 2023-03-18 NOTE — Telephone Encounter (Signed)
 Patient identification verified by 2 forms. Bertina Cooks, RN    Called and spoke to patient  Informed patient per Dr. Mona okay to have appointment for 03/06 can follow up sooner with APP  Patient states:   -will keep 03/06 OV with Dr. Mona, does not need sooner appointment   -is planning on starting new Rx for pulmonary emphysema   -does not have more information about Rx at this time  Advised patient to outreach office when has more information about Rx  Patient verbalized understanding, no questions at this time

## 2023-03-22 ENCOUNTER — Encounter (HOSPITAL_COMMUNITY)
Admission: RE | Admit: 2023-03-22 | Discharge: 2023-03-22 | Disposition: A | Discharge status: Home / Self Care | Payer: Medicare Other | Source: Ambulatory Visit | Attending: Pulmonary Disease

## 2023-03-22 DIAGNOSIS — J849 Interstitial pulmonary disease, unspecified: Secondary | ICD-10-CM

## 2023-03-22 NOTE — Progress Notes (Signed)
Daily Session Note  Patient Details  Name: Charles Wright MRN: [id number] Date of Birth: August 11, 1935 Referring Provider:   Doristine Devoid Pulmonary Rehab Walk Test from 11/29/2022 in [facility] for Heart, Vascular, & Lung Health  Referring Provider Mannam       Encounter Date: 03/22/2023  Check In:  Session Check In - 03/22/23 1458       Check-In   Supervising physician immediately available to respond to emergencies [facility] MD immediately available    Physician(s) Hoover Browns, NP    Location [facility]    Staff Present Essie Hart, RN, BSN;Jeramine Delis Katrinka Blazing, Zella Richer, MS, ACSM-CEP, Exercise Physiologist;Randi Dionisio Paschal, ACSM-CEP, Exercise Physiologist    Virtual Visit No    Medication changes reported     No    Fall or balance concerns reported    No    Tobacco Cessation No Change    Warm-up and Cool-down Performed as group-led instruction    Resistance Training Performed Yes    VAD Patient? No    PAD/SET Patient? No      Pain Assessment   Currently in Pain? No/denies    Multiple Pain Sites No             Capillary Blood Glucose: No results found for this or any previous visit (from the past 24 hours).    Social History   Tobacco Use  Smoking Status Never  Smokeless Tobacco Never    Goals Met:  Proper associated with RPD/PD & O2 Sat Independence with exercise equipment Exercise tolerated well No report of concerns or symptoms today Strength training completed today  Goals Unmet:  Not Applicable  Comments: Service time is from 1300 to 1435.    Dr. Mechele Collin is Medical Director for Pulmonary Rehab at [facility]

## 2023-03-22 NOTE — Progress Notes (Signed)
   03/22/23 1526  6 Minute Walk  Phase Discharge  Distance 1812 feet  Distance % Change 44.96 %  Distance Feet Change 562 ft  Walk Time 6 minutes  # of Rest Breaks 0  MPH 3.43  METS 3.41  RPE 13  Perceived Dyspnea  2  VO2 Peak 11.93  Symptoms No  Resting HR 72 bpm  Resting BP 130/70  Resting Oxygen Saturation  96 %  Exercise Oxygen Saturation  during 6 min walk 86 %  Max Ex. HR 119 bpm  Max Ex. BP 170/70  2 Minute Post BP 140/72  Interval HR  1 Minute HR 76  2 Minute HR 76  3 Minute HR 108  4 Minute HR 107  5 Minute HR 115  6 Minute HR 119  2 Minute Post HR 82  Interval Oxygen  Baseline Oxygen Saturation % 96 %  1 Minute Oxygen Saturation % 86 %  1 Minute Liters of Oxygen 0 L (applied 2L)  2 Minute Oxygen Saturation % 90 %  2 Minute Liters of Oxygen 2 L  3 Minute Oxygen Saturation % 88 %  3 Minute Liters of Oxygen 2 L  4 Minute Oxygen Saturation % 89 %  4 Minute Liters of Oxygen 2 L  5 Minute Oxygen Saturation % 86 %  5 Minute Liters of Oxygen 2 L (increased to 3L)  6 Minute Oxygen Saturation % 88 %  6 Minute Liters of Oxygen 3 L  2 Minute Post Oxygen Saturation % 95 %  2 Minute Post Liters of Oxygen 3 L   Pt is graduating Pulmonary Rehab and did his exit 6 min walk test. He needed 2L at min 1 and 3L O2 at minute 5. He also has been using 2L in class while intermittently jogging on the treadmill. He has goals to continue exercising at his community gym and would benefit from home O2 (POC).  Ethelda Chick BS, ACSM-CEP 03/22/2023 3:36 PM

## 2023-03-24 ENCOUNTER — Encounter (HOSPITAL_COMMUNITY)
Admission: RE | Admit: 2023-03-24 | Discharge: 2023-03-24 | Disposition: A | Discharge status: Home / Self Care | Payer: Medicare Other | Source: Ambulatory Visit | Attending: Pulmonary Disease

## 2023-03-24 DIAGNOSIS — J849 Interstitial pulmonary disease, unspecified: Secondary | ICD-10-CM

## 2023-03-24 NOTE — Progress Notes (Signed)
Daily Session Note  Patient Details  Name: Charles Wright MRN: [id number] Date of Birth: August 28, 1935 Referring Provider:   Doristine Devoid Pulmonary Rehab Walk Test from 11/29/2022 in [facility] for Heart, Vascular, & Lung Health  Referring Provider Mannam       Encounter Date: 03/24/2023  Check In:  Session Check In - 03/24/23 1343       Check-In   Supervising physician immediately available to respond to emergencies [facility] MD immediately available    Physician(s) Rise Paganini, NP    Location [facility]    Staff Present Essie Hart, RN, BSN;Casey Charlean Sanfilippo, MS, ACSM-CEP, Exercise Physiologist    Virtual Visit No    Medication changes reported     No    Fall or balance concerns reported    No    Tobacco Cessation No Change    Warm-up and Cool-down Performed as group-led instruction    Resistance Training Performed Yes    VAD Patient? No    PAD/SET Patient? No      Pain Assessment   Currently in Pain? No/denies    Multiple Pain Sites No             Capillary Blood Glucose: No results found for this or any previous visit (from the past 24 hours).    Social History   Tobacco Use  Smoking Status Never  Smokeless Tobacco Never    Goals Met:  Independence with exercise equipment Exercise tolerated well No report of concerns or symptoms today Strength training completed today  Goals Unmet:  Not Applicable  Comments: Service time is from 1316 to 1450    Dr. Mechele Collin is Medical Director for Pulmonary Rehab at [facility].

## 2023-03-25 NOTE — Progress Notes (Signed)
Discharge Progress Report  Patient Details  Name: Charles Wright MRN: [id number] Date of Birth: Sep 21, 1935 Referring Provider:   Doristine Devoid Pulmonary Rehab Walk Test from 11/29/2022 in [facility]  Referring Provider Mannam        Number of Visits: 30  Reason for Discharge:  Patient has met program and personal goals.  Smoking History:  Social History   Tobacco Use  Smoking Status Never  Smokeless Tobacco Never    Diagnosis:  Interstitial lung disease (HCC)  ADL UCSD:  Pulmonary Assessment Scores     Row Name 11/29/22 0841 03/10/23 1508 03/22/23 1537     ADL UCSD   ADL Phase Entry Exit --   SOB Score total 22 13 --     CAT Score   CAT Score 18 11 --     mMRC Score   mMRC Score 1 -- 0            Initial Exercise Prescription:  Initial Exercise Prescription - 11/29/22 0900       Date of Initial Exercise RX and Referring Provider   Date 11/29/22    Referring Provider Mannam    Expected Discharge Date 02/23/22      Recumbant Elliptical   Level 2    RPM 55    Watts 80    Minutes 15      Track   Minutes 15    METs 2      Prescription Details   Frequency (times per week) 2    Duration Progress to 30 minutes of continuous aerobic without signs/symptoms of physical distress      Intensity   THRR 40-80% of Max Heartrate 54-108    Ratings of Perceived Exertion 11-13    Perceived Dyspnea 0-4      Progression   Progression Continue to progress workloads to maintain intensity without signs/symptoms of physical distress.      Resistance Training   Training Prescription Yes    Weight blue bands             Discharge Exercise Prescription (Final Exercise Prescription Changes):  Exercise Prescription Changes - 03/15/23 1400       Response to Exercise   Blood Pressure (Admit) 104/60    Blood Pressure (Exit) 106/60    Heart Rate (Admit) 68 bpm    Heart Rate (Exercise) 115 bpm     Heart Rate (Exit) 1.77 bpm    Oxygen Saturation (Admit) 96 %    Oxygen Saturation (Exercise) 89 %    Oxygen Saturation (Exit) 93 %    Rating of Perceived Exertion (Exercise) 13    Perceived Dyspnea (Exercise) 2    Duration Continue with 30 min of aerobic exercise without signs/symptoms of physical distress.    Intensity THRR New      Progression   Progression Continue to progress workloads to maintain intensity without signs/symptoms of physical distress.      Resistance Training   Training Prescription Yes    Weight black bands    Reps 10-15    Time 10 Minutes      Interval Training   Interval Training Yes    Equipment Treadmill    Comments 98min@2 .5/1.5/3. & 4/1.5/5.      Oxygen   Oxygen Intermittent    Liters 0-2      Elliptical   Level 1    Speed 3    Minutes 15    METs  5.2      Oxygen   Maintain Oxygen Saturation 88% or higher             Functional Capacity:  6 Minute Walk     Row Name 11/29/22 0956 03/22/23 1526       6 Minute Walk   Phase Initial Discharge    Distance 1250 feet 1812 feet    Distance % Change -- 44.96 %    Distance Feet Change -- 562 ft    Walk Time 6 minutes 6 minutes    # of Rest Breaks 0 0    MPH 2.37 3.43    METS 1.67 3.41    RPE 9 13    Perceived Dyspnea  1 2    VO2 Peak 5.85 11.93    Symptoms No No    Resting HR 79 bpm 72 bpm    Resting BP 104/60 130/70    Resting Oxygen Saturation  95 % 96 %    Exercise Oxygen Saturation  during 6 min walk 91 % 86 %    Max Ex. HR 93 bpm 119 bpm    Max Ex. BP 110/60 170/70    2 Minute Post BP 118/68  taken after H2O given 140/72      Interval HR   1 Minute HR 81 76    2 Minute HR 87 76    3 Minute HR 90 108    4 Minute HR 90 107    5 Minute HR 93 115    6 Minute HR 78 119    2 Minute Post HR 77 82    Interval Heart Rate? Yes --      Interval Oxygen   Interval Oxygen? Yes --    Baseline Oxygen Saturation % 95 % 96 %    1 Minute Oxygen Saturation % 95 % 86 %     1 Minute Liters of Oxygen 0 L 0 L  applied 2L    2 Minute Oxygen Saturation % 92 % 90 %    2 Minute Liters of Oxygen 0 L 2 L    3 Minute Oxygen Saturation % 92 % 88 %    3 Minute Liters of Oxygen 0 L 2 L    4 Minute Oxygen Saturation % 91 % 89 %    4 Minute Liters of Oxygen 0 L 2 L    5 Minute Oxygen Saturation % 92 % 86 %    5 Minute Liters of Oxygen 0 L 2 L  increased to 3L    6 Minute Oxygen Saturation % 93 % 88 %    6 Minute Liters of Oxygen 0 L 3 L    2 Minute Post Oxygen Saturation % 98 % 95 %    2 Minute Post Liters of Oxygen 0 L 3 L             Psychological, QOL, Others - Outcomes: PHQ 2/9:    03/10/2023    3:07 PM 11/29/2022    8:32 AM 06/01/2017    2:48 PM 05/04/2017    9:36 AM 10/07/2016    1:50 PM  Depression screen PHQ 2/9  Decreased Interest 0 0 0 0 0  Down, Depressed, Hopeless 0 0 0 0 0  PHQ - 2 Score 0 0 0 0 0  Altered sleeping 0 0     Tired, decreased energy 0 0     Change in appetite 0 0     Feeling bad  or failure about yourself  0 0     Trouble concentrating 0 0     Moving slowly or fidgety/restless 0 0     Suicidal thoughts 0 0     PHQ-9 Score 0 0     Difficult doing work/chores Not difficult at all Not difficult at all       Quality of Life:   Personal Goals: Goals established at orientation with interventions provided to work toward goal.  Personal Goals and Risk Factors at Admission - 11/29/22 0835       Core Components/Risk Factors/Patient Goals on Admission    Weight Management Weight Loss;Yes    Intervention Weight Management: Develop a combined nutrition and exercise program designed to reach desired caloric intake, while maintaining appropriate intake of nutrient and fiber, sodium and fats, and appropriate energy expenditure required for the weight goal.;Weight Management: Provide education and appropriate resources to help participant work on and attain dietary goals.;Weight Management/Obesity: Establish reasonable short term and long  term weight goals.;Obesity: Provide education and appropriate resources to help participant work on and attain dietary goals.    Expected Outcomes Short Term: Continue to assess and modify interventions until short term weight is achieved;Long Term: Adherence to nutrition and physical activity/exercise program aimed toward attainment of established weight goal;Weight Maintenance: Understanding of the daily nutrition guidelines, which includes 25-35% calories from fat, 7% or less cal from saturated fats, less than 200mg  cholesterol, less than 1.5gm of sodium, & 5 or more servings of fruits and vegetables daily;Weight Loss: Understanding of general recommendations for a balanced deficit meal plan, which promotes 1-2 lb weight loss per week and includes a negative energy balance of [phone] kcal/d;Understanding of distribution of calorie intake throughout the day with the consumption of 4-5 meals/snacks;Understanding recommendations for meals to include 15-35% energy as protein, 25-35% energy from fat, 35-60% energy from carbohydrates, less than 200mg  of dietary cholesterol, 20-35 gm of total fiber daily    Improve shortness of breath with ADL's Yes    Intervention Provide education, individualized exercise plan and daily activity instruction to help decrease symptoms of SOB with activities of daily living.    Expected Outcomes Short Term: Improve cardiorespiratory fitness to achieve a reduction of symptoms when performing ADLs;Long Term: Be able to perform more ADLs without symptoms or delay the onset of symptoms              Personal Goals Discharge:  Goals and Risk Factor Review     Row Name 12/03/22 1019 12/31/22 0956 01/24/23 1221 02/21/23 0920 03/25/23 1131     Core Components/Risk Factors/Patient Goals Review   Personal Goals Review Weight Management/Obesity;Improve shortness of breath with ADL's;Develop more efficient breathing techniques such as purse lipped breathing and diaphragmatic  breathing and practicing self-pacing with activity. Weight Management/Obesity;Improve shortness of breath with ADL's;Develop more efficient breathing techniques such as purse lipped breathing and diaphragmatic breathing and practicing self-pacing with activity. Weight Management/Obesity;Improve shortness of breath with ADL's;Develop more efficient breathing techniques such as purse lipped breathing and diaphragmatic breathing and practicing self-pacing with activity. Weight Management/Obesity;Improve shortness of breath with ADL's Weight Management/Obesity;Improve shortness of breath with ADL's   Review No changes from orientation. Saatvik is scheduled to start the program on 12/07/22. Core components/risk factors/patient goals monthly re-evaluate are as follows: Neftali has not met his goal to lose any weight this month. He is up in weight from starting the program. He reports eating 2 meals a day and eating a wide variety of foods.  Tauren has been open to suggestions from our dietitian but hasn't implanted suggestions yet. We will continue to support Cederic in his goal to lose weight. Goal in progress on improving SOB with ADLs and develop more efficient breathing techniques such as purse lipped breathing and diaphragmatic breathing; and practicing self-pacing with activity. Graeme is learning on how to initiate these techniques before becoming severely short of breath or tired. 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 Parv has been quickly increasing both his workload and METs and has been exercising here with great motivation to get to where he once was. With supervision of staff, he has been making excellent progress and we will continue to monitor and evaluate. He will continue to benefit from participation in PR for nutrition, education, exercise, and lifestyle modification. Core components/risk factors/patient goals monthly re-evaluate are as follows: Kenderick has not met his goal to lose any weight this month. He has  maintained his weight since starting the program. He reports eating 2 meals a day and eating a wide variety of foods. Pradeep has been open to suggestions from our dietitian but hasn't made any changes yet. We will continue to support Kainoah in his goal to lose weight. Goal in progress on improving SOB with ADLs. Shakeem can report his rate of perceived exertion and dyspnea level accurately. He has been able to maintain his oxygen saturation on room air. Goal met for developing more efficient breathing techniques such as purse lipped breathing and diaphragmatic breathing; and practicing self-pacing with activity. Linden can initiate these techniques before becoming severely short of breath or tired. Verlon is an athlete who likes to push himself. Harlem has been quickly increasing both his workload and METs and has been exercising here with great motivation to get to where he once was. With supervision of staff, he has been making excellent progress and will start running on the treadmill this week. Franciscojavier will continue to benefit from participation in PR for nutrition, education, exercise, and lifestyle modification. Core components/risk factors/patient goals monthly re-evaluate are as follows: Maximilliano has not met his goal to lose any weight this month. He has maintained his weight since starting the program. He reports eating 2 meals a day and eating a wide variety of foods. Olyn has been open to suggestions from our dietitian but hasn't made any changes yet. We will continue to support Boykin in his goal to lose weight. Goal in progress on improving SOB with ADLs. Jahir can report his rate of perceived exertion and dyspnea level accurately. He has been able to maintain his oxygen saturation on room air but might need oxygen while running on the treadmill. His oxygen saturation was borderline last session, this is the first time he ran on the treadmill. Jakhari has been quickly increasing both his workload and METs and has been exercising  here with great motivation to get to where he once was. With supervision of staff, he has been making excellent progress and will start running on the treadmill this week. Page will continue to benefit from participation in PR for nutrition, education, exercise, and lifestyle modification. Tarell graduated from the [employer] on 03/24/23. He did not meet his goal for weight loss. His weight maintained throughout the program. Goal met for improving SOB with ADL's. His SOB score decreased from 22 to 13. Erron was a pleasure to have in class. We wish him the best.   Expected Outcomes For Garnet to lose weight, improve his SOB with ADLs, and  develop more efficient breathing techniques such as purse lipped breathing and diaphragmatic breathing; and practicing self-pacing with activity For Willet to lose weight, improve his SOB with ADLs, and develop more efficient breathing techniques such as purse lipped breathing and diaphragmatic breathing; and practicing self-pacing with activity For Maksymilian to lose weight and improve his SOB with ADLs For Shreyansh to lose weight and improve his SOB with ADLs --            Exercise Goals and Review:  Exercise Goals     Row Name 11/29/22 0833             Exercise Goals   Increase Physical Activity Yes       Intervention Provide advice, education, support and counseling about physical activity/exercise needs.;Develop an individualized exercise prescription for aerobic and resistive training based on initial evaluation findings, risk stratification, comorbidities and participant's personal goals.       Expected Outcomes Short Term: Attend rehab on a regular basis to increase amount of physical activity.;Long Term: Add in home exercise to make exercise part of routine and to increase amount of physical activity.;Long Term: Exercising regularly at least 3-5 days a week.       Increase Strength and Stamina Yes       Intervention Provide advice, education, support and counseling  about physical activity/exercise needs.;Develop an individualized exercise prescription for aerobic and resistive training based on initial evaluation findings, risk stratification, comorbidities and participant's personal goals.       Expected Outcomes Short Term: Increase workloads from initial exercise prescription for resistance, speed, and METs.;Short Term: Perform resistance training exercises routinely during rehab and add in resistance training at home;Long Term: Improve cardiorespiratory fitness, muscular endurance and strength as measured by increased METs and functional capacity ( )       Able to understand and use rate of perceived exertion (RPE) scale Yes       Intervention Provide education and explanation on how to use RPE scale       Expected Outcomes Short Term: Able to use RPE daily in rehab to express subjective intensity level;Long Term:  Able to use RPE to guide intensity level when exercising independently       Able to understand and use Dyspnea scale Yes       Intervention Provide education and explanation on how to use Dyspnea scale       Expected Outcomes Short Term: Able to use Dyspnea scale daily in rehab to express subjective sense of shortness of breath during exertion;Long Term: Able to use Dyspnea scale to guide intensity level when exercising independently       Knowledge and understanding of Target Heart Rate Range (THRR) Yes       Intervention Provide education and explanation of THRR including how the numbers were predicted and where they are located for reference       Expected Outcomes Short Term: Able to state/look up THRR;Long Term: Able to use THRR to govern intensity when exercising independently;Short Term: Able to use daily as guideline for intensity in rehab       Understanding of Exercise Prescription Yes       Intervention Provide education, explanation, and written materials on patient's individual exercise prescription       Expected Outcomes Short  Term: Able to explain program exercise prescription;Long Term: Able to explain home exercise prescription to exercise independently                Exercise Goals  Re-Evaluation:  Exercise Goals Re-Evaluation     Row Name 11/29/22 1508 12/28/22 0832 01/20/23 0919 02/22/23 0856 03/22/23 1539     Exercise Goal Re-Evaluation   Exercise Goals Review Increase Physical Activity;Able to understand and use Dyspnea scale;Understanding of Exercise Prescription;Increase Strength and Stamina;Knowledge and understanding of Target Heart Rate Range (THRR);Able to understand and use rate of perceived exertion (RPE) scale Increase Physical Activity;Able to understand and use Dyspnea scale;Understanding of Exercise Prescription;Increase Strength and Stamina;Knowledge and understanding of Target Heart Rate Range (THRR);Able to understand and use rate of perceived exertion (RPE) scale Increase Physical Activity;Able to understand and use Dyspnea scale;Understanding of Exercise Prescription;Increase Strength and Stamina;Knowledge and understanding of Target Heart Rate Range (THRR);Able to understand and use rate of perceived exertion (RPE) scale Increase Physical Activity;Able to understand and use Dyspnea scale;Understanding of Exercise Prescription;Increase Strength and Stamina;Knowledge and understanding of Target Heart Rate Range (THRR);Able to understand and use rate of perceived exertion (RPE) scale Increase Physical Activity;Able to understand and use Dyspnea scale;Understanding of Exercise Prescription;Increase Strength and Stamina;Knowledge and understanding of Target Heart Rate Range (THRR);Able to understand and use rate of perceived exertion (RPE) scale   Comments Pt is scheduled to begin group exercise 10/29. Will monitor for progression. Pt has completed 6 exercise sessions with perfect attendance. Rishav is exercising for 15 min on recumbent elliptical and treadmill. Fontaine averages 5.1 METs at level 6 on the  recumbent elliptical. He is walking on the treadmill at 2.7 mph, METs 2.9. He is very motivated. He performed the warmup and cooldown standing without limitations.  Will monitor for progression. Pt has completed 11 exercise sessions with perfect attendance. Trentan is exercising for 15 min on upright elliptical and treadmill. He recently moved to the upright elliptical for more progression, speed 1, incline 1 %, METs 4.6.  He then is walking on the treadmill at 2.7 mph, incline 1.5, METs 3.6. He is very motivated and has voiced that he would like to try to jog (former [employer]). We will try intervals on the treadmill. He is using black bands with resistance training. Will monitor for progression. Pt has completed 20 exercise sessions with perfect attendance. Maanav is exercising for 15 min on upright elliptical and treadmill. He is exercising on the upright elliptical, speed 1, incline 1 %, METs 5.1.  It is challenging for him. He then is walking/jogging on the treadmill in intervals. His jogging interval is 4 mph, 1.5 incline for 30-60 seconds, METs 5.3. His low/walking interval is 2.5 mph, incline 1.5, METs 3.1, for 2 min. Najib is very motivated and happy to achieve a form of jogging again. He is using 7.3 lb black bands with resistance training. Will continue to progress pt. Pt will have completed 30 exercise sessions with perfect attendance. He has been very motivated and has achieved his goal of running in intervals on the treadmill. He increased his 6 min walk test by 45%, 562 ft. His max METs were 5.2. He consistently used black and double blacks bands. His SOB score decreased from 22 to 13 and his CAT decreased from 18 to 11. He is planning to continue exercise at [employer] with his wife.   Expected Outcomes Through exercise at rehab and at home, the patient will decrease shortness of breath with daily activities and feel confident in carrying out an exercise regime at home. Through exercise at rehab and  at home, the patient will decrease shortness of breath with daily activities and feel confident in carrying out  an exercise regime at home. Through exercise at rehab and at home, the patient will decrease shortness of breath with daily activities and feel confident in carrying out an exercise regime at home. Through exercise at rehab and at home, the patient will decrease shortness of breath with daily activities and feel confident in carrying out an exercise regime at home. Through exercise at rehab and at home, the patient will decrease shortness of breath with daily activities and feel confident in carrying out an exercise regime at home.            Nutrition & Weight - Outcomes:  Pre Biometrics - 11/29/22 0828       Pre Biometrics   Grip Strength 30 kg             Post Biometrics - 03/22/23 1537        Post  Biometrics   Grip Strength 30 kg             Nutrition:  Nutrition Therapy & Goals - 01/04/23 1529       Nutrition Therapy   Diet Heart Healthy Diet    Drug/Food Interactions Statins/Certain Fruits      Personal Nutrition Goals   Nutrition Goal Patient to improve diet quality by using the plate method as a guide for meal planning to include lean protein/plant protein, fruits, vegetables, whole grains, nonfat dairy as part of a well-balanced diet.   goal in progress.   Comments Stalin has medical history of NSTEMI, hyperlipidemia, sleep apnea, hx of TIA, ILD, afib ablation july 2024. His LDL is at goal. He reports no nutrition concerns/goals at this time. He reports eating a wide variety of foods, two meals daily, and stable appetite. He has maintained his weight since starting with our program.His wife is a good support.  Patient will benefit from participation in pulmonary rehab for nutrition, exercise, and lifestyle modification.      Intervention Plan   Intervention Prescribe, educate and counsel regarding individualized specific dietary modifications aiming  towards targeted core components such as weight, hypertension, lipid management, diabetes, heart failure and other comorbidities.;Nutrition handout(s) given to patient.    Expected Outcomes Short Term Goal: Understand basic principles of dietary content, such as calories, fat, sodium, cholesterol and nutrients.;Long Term Goal: Adherence to prescribed nutrition plan.             Nutrition Discharge:  Nutrition Assessments - 12/07/22 1533       Rate Your Plate Scores   Pre Score 49             Education Questionnaire Score:  Knowledge Questionnaire Score - 03/10/23 1508       Knowledge Questionnaire Score   Post Score 18/18             Goals reviewed with patient; copy given to patient.

## 2023-03-28 ENCOUNTER — Telehealth: Payer: Self-pay

## 2023-03-28 DIAGNOSIS — J849 Interstitial pulmonary disease, unspecified: Secondary | ICD-10-CM

## 2023-03-28 NOTE — Telephone Encounter (Signed)
Lm for pt. Will place order after speaking with pt and confirming if he has a preferred DME company.

## 2023-03-28 NOTE — Telephone Encounter (Signed)
-----   Message from [facility] sent at 03/25/2023 11:40 AM EST ----- Yes. Please order ----- Message ----- From: Rosaland Lao, CMA Sent: 03/25/2023  11:39 AM EST To: Jadene Pierini, MD  Dr. Isaiah Serge, please advise if okay to order. Thanks ----- Message ----- From: Megan Salon Sent: 03/22/2023   3:39 PM EST To: Chilton Greathouse, MD; Rosaland Lao, CMA  Please see pt's six minute walk test in progress notes. He qualifies for 3L O2. Please order if you agree. Thank you

## 2023-03-29 NOTE — Telephone Encounter (Signed)
Patient dropped off income documents for [employer] PAP application. Copies made and submitted to [facility]  Phone #: [phone] Fax #: [phone]

## 2023-04-01 NOTE — Telephone Encounter (Signed)
I have notified the patient and placed the O2 order.  Nothing further needed.

## 2023-04-06 ENCOUNTER — Telehealth: Payer: Self-pay | Admitting: Internal Medicine

## 2023-04-06 MED ORDER — OFEV 150 MG PO CAPS
150.0000 mg | ORAL_CAPSULE | Freq: Two times a day (BID) | ORAL | 1 refills | Status: DC
Start: 2023-04-06 — End: 2023-07-08

## 2023-04-06 NOTE — Telephone Encounter (Signed)
 Patient identification verified by 2 forms. Marilynn Rail, RN    Received call from patient  Patient states:   -he had a bad night last night   -had some lower chest pain last night   -pain radiated to left arm and shoulder   -fluttering in chest is back   -symptoms off and on for 2-3 weeks   -continues to have left side chest pain 2-3/10 pain   -current pain does not warrant ED evaluation  -these symptoms occur twice a week Advised patient:   -keep 3/6 OV with Dr. Rennis Golden  Reviewed ED warning signs/precautions  Patient verbalized understanding, no questions at this time

## 2023-04-06 NOTE — Telephone Encounter (Signed)
 Called [employer] for update on [employer] patient assistance. They have confirmed receipt of income documents which they have yet to review.  Per rep, patient opted out of patient assistance on 03/10/2023  Per rep, patient is approved through 02/08/2024 now (they were able to reactivate his case)  Phone: [phone]  Called patient to notify of approval. Provided him with [facility] [employer] approval to f/u to schedule shipment in case he has not heard from company by Friday  Chesley Mires, PharmD, MPH, BCPS, CPP Clinical Pharmacist (Rheumatology and Pulmonology)

## 2023-04-06 NOTE — Telephone Encounter (Signed)
 Pt c/o of Chest Pain: STAT if CP now or developed within 24 hours  1. Are you having CP right now? No, but it was last night and in front of the shoulder  2. Are you experiencing any other symptoms (ex. SOB, nausea, vomiting, sweating)? Not right now but SOB last night when it was happening and fluttering.   3. How long have you been experiencing CP? Started around midnight   4. Is your CP continuous or coming and going? Coming and going   5. Have you taken Nitroglycerin? No ?

## 2023-04-11 DIAGNOSIS — J849 Interstitial pulmonary disease, unspecified: Secondary | ICD-10-CM | POA: Diagnosis not present

## 2023-04-14 ENCOUNTER — Encounter: Payer: Self-pay | Admitting: Internal Medicine

## 2023-04-14 ENCOUNTER — Ambulatory Visit: Payer: Medicare Other | Attending: Internal Medicine | Admitting: Internal Medicine

## 2023-04-14 VITALS — BP 116/70 | HR 68 | Ht 67.0 in | Wt 192.0 lb

## 2023-04-14 DIAGNOSIS — I480 Paroxysmal atrial fibrillation: Secondary | ICD-10-CM | POA: Diagnosis not present

## 2023-04-14 DIAGNOSIS — R42 Dizziness and giddiness: Secondary | ICD-10-CM | POA: Diagnosis not present

## 2023-04-14 DIAGNOSIS — I119 Hypertensive heart disease without heart failure: Secondary | ICD-10-CM | POA: Diagnosis not present

## 2023-04-14 DIAGNOSIS — I25118 Atherosclerotic heart disease of native coronary artery with other forms of angina pectoris: Secondary | ICD-10-CM

## 2023-04-14 DIAGNOSIS — J849 Interstitial pulmonary disease, unspecified: Secondary | ICD-10-CM

## 2023-04-14 NOTE — Patient Instructions (Signed)
 Medication Instructions:  No Changes *If you need a refill on your cardiac medications before your next appointment, please call your pharmacy*  Lab Work: None  Testing/Procedures: None (see info below on [employer] device)  Follow-Up: At [facility] you and your health needs are our priority.  As part of our continuing mission to provide you with exceptional heart care, we have created designated Provider Care Teams.  These Care Teams include your primary Cardiologist (physician) and Advanced Practice Providers (APPs -  Physician Assistants and Nurse Practitioners) who all work together to provide you with the care you need, when you need it.  Your next appointment:   6 month(s)  Provider:   Please see a NP or PA Marylene Land Duke, PA-C, [place], PA-C, [place], [place], or [place], NP)      Other Instructions  [employer] Website: [email or web address]  WE RECOMMEND YOU PURCHASE  " Kardia" [employer] FROM THE  [employer]  APP PLAY STORE.  THE APP IS FREE , BUT THE  EQUIPMENT HAS A COST. IT ALLOWS YOU TO OBTAIN A RECORDING OF YOUR HEART RATE AND RHYTHM BY PROVIDING A SHORT STRIP THAT YOU CAN SHARE WITH YOUR PROVIDER.     [facility] - sending an EKG Download app and set up profile. Run EKG - by placing 1-2 fingers on the silver plates After EKG is complete - Download PDF  - Skip password (if you apply a password the provider will need it to view the EKG) Click share button (square with upward arrow) in bottom left corner To send: choose MyChart (first time log into MyChart)  Pop up window about sending ECG Click continue Choose type of message Choose provider Type subject and message Click send (EKG should be attached)  - To send additional EKGs in one message click the paperclip image and bottom of page to attach.

## 2023-04-14 NOTE — Progress Notes (Signed)
 OFFICE NOTE  Chief Complaint:  Follow-up, dizziness, shortness of breath  Primary Care Physician: Alyson Ingles, PA-C (Inactive)  HPI:  Charles Wright is a 88 y.o. male with a past medial history significant for coronary artery disease with non-STEMI in 2016.  He was found to have distal LAD disease not amenable to PCI as well as moderate mid to proximal LAD disease.  Medical therapy was recommended.  He also has a history of bladder cancer, prostate cancer, hyperlipidemia, hypertension and OSA on CPAP.  He has been followed by Dr. Donnie Aho.  He maintains on clopidogrel but not aspirin.  Recently he has been having issues with ureteral stricture and is recommended to have a cystoscopy with Dr. Mena Goes.  He would require clearance including holding clopidogrel prior to that procedure.  Today in the office he is coughing significantly.  He apparently had upper respiratory infection and then also had concomitant rash which was painful and vesicular and diagnosed as shingles by a dermatologist that he is friends with.  Besides his shortness of breath related upper respiratory infection as well as chest wall pain from shingles, he denies any chest pain concerning for angina or recent worsening shortness of breath.  10/10/2018  Charles Wright returns today for follow-up of coronary disease and atrial fibrillation.  He is a former patient of Dr. Donnie Aho however Dr. Donnie Aho is now retired.  When we last spoke he received preoperative clearance for urologic procedure.  He does have a history of a bladder cancer and hematuria.  Interestingly, based on further chart review, he had had a prior watchman procedure for his A. fib attempt at stroke reduction.  At that time because of bleeding he was not felt to be a candidate for anticoagulation for his A. fib.  He had been on amiodarone.  He was then referred for watchman procedure which shows ultimately unsuccessful.  I was involved in the imaging component of that  procedure.  Subsequently it was recommended he go on aspirin and Plavix since he was not a candidate for anticoagulation due to GU bleeding from bladder cancer.  That has however been successfully treated and has had no further bleeding issues.  He also just had recent ureteral stenting which seems to have been successful.  When reviewing his chart today however it it notes that he is only on clopidogrel monotherapy.  Have an STEMI in 2016 and was found to have significant coronary disease however not amenable to intervention.  He is done well with that without any further chest pain however did have significant fatigue recently.  It turned out this was related to low testosterone and he is now on testosterone replacement.  Likely a side effect of antiandrogen therapy.  Recent labs show total cholesterol 158, HDL 50 LDL, 86 and triglycerides 846.  05/02/2019  Charles Wright returns today for follow-up. He is complaining of some issues with lightheadedness. He recently underwent surgery for what was suspicious for a left upper tract urothelial carcinoma. This was complicated by pain and bladder spasms requiring multiple ER visits. He has had a decreased appetite and weight loss (5 pounds of which was his left kidney/ureter. Additionally, he is down a total of 20 pounds since I last saw him. This is pertinent because his blood pressure today was 90/60. In general it has run higher than this. I suspect it may be a reason why feels lightheaded. His EKG shows a sinus bradycardia at 55. Possible medications that could causes include  his isosorbide and metoprolol which she takes 50 mg twice daily.  08/21/2019  Charles Wright seen today in follow-up.  Overall he continues to do well.  He says since he had his nephrectomies had no further bleeding issues.  He denies any chest pain or worsening shortness of breath.  Recently had some orthostatic hypotension with low blood pressure noted to be in the 90s systolic.  He was taken  off of his Imdur and his beta-blocker was decreased.  Blood pressure today was 116/72 and his mention he feels well.  01/17/2020  Charles Wright is seen today for follow-up.  Over the summer he had chest pain and was seen by Azalee Course, PA-C.  Stress test was performed which showed small fixed apical defect without any ischemia, consistent with known distal LAD severe stenosis.  This vessel was thought to be too small to intervene on.  No other reversible ischemia was appreciated.  He noted that his chest pain was not improved with nitroglycerin.  During his exam he was also found to have bibasilar dry crackles consistent with possible interstitial lung disease.  High-resolution CT scan was performed which did corroborate this.  He has subsequently been referred to pulmonary and saw Dr. Isaiah Serge, who feels he may have IPF.  He has pending pulmonary function tests and follow-up with him in January.  He reports he still gets some intermittent chest discomfort in the left upper chest.  04/18/2020  Charles Wright returns for follow-up.  He continues to have work-up of pulmonary fibrosis.  He remains on low-dose Eliquis 2.5 mg twice daily.  Heart rate is in the 60s today.  EKG shows sinus rhythm with frequent PVCs in a quadrigeminal pattern.  He says he is really unaware of any extrasystoles or palpitations.  Weight is down about 6 pounds since we last saw him.  04/21/2021  Charles Wright returns today for follow-up.  He had called the office last year about concerns of palpitations, dizziness and fatigue.  This has been going on and off for the last year.  I was concerned that he might be having recurrent atrial fibrillation or perhaps PVCs.  We asked him to come to the office for an appointment but have not seen him until today.  EKG today does demonstrate frequent PVCs these in fact ventricular bigeminy.  If he has been in this for a good period of time he may have developed some cardiomyopathy.  11/17/2021  Charles Wright  returns today for follow-up.  He continues to have episodes of dizziness, fatigue and progressive dyspnea on exertion.  He was seen in April by Gillian Shields, NP and noted to be having more palpitations.  She had proposed increasing his metoprolol.  He wore a monitor which showed some recurrent atrial fibrillation/flutter and SVT with a low burden of about 3%.  His metoprolol was then increased up to 50 mg twice daily.  This provided perhaps a little bit of benefit however subsequently he has become worse.  His energy level is poor.  He is quite fatigued and gets short of breath easily.  He is now waking up in the middle the night with palpitations.  He denies any chest pain.  Of note he had a cardiac catheterization back in 2016 which showed 99% stenosis of the distal LAD at the time he had NSTEMI as well as 50 to 60% stenosis of the mid LAD.  My concern is that he has had progression of this coronary disease.  01/26/2022  Charles Wright underwent recent left heart catheterization in October.  This showed stable coronary artery disease with stable apical LAD stenosis which was severe as well as nonobstructive proximal and mid LAD plaquing, angiographically improved from his last cath.  No intervention was undertaken.  Subsequently he saw Dr. Nelly Laurence with cardiac EP for options for his atrial fibrillation.  They discussed the possibility of using dofetilide but he was concerned about side effects.  Ultimately he agreed to catheter ablation.  This is scheduled for February.  11/22/2022  Charles Wright returns today for follow-up.  Overall he says he is feeling wonderful.  He underwent A-fib ablation earlier this year by Dr. Nelly Laurence.  Subsequently had cardioversion and has been maintaining sinus rhythm since then.  Renal function has improved to the extent that he is now back on Eliquis 5 mg twice daily.  EKG today shows that he is maintaining sinus rhythm.  His lipids were recently assessed which showed a LDL  cholesterol of 53 back in April.  He has denied any chest pain or shortness of breath with exertion.  He does have some pulmonary fibrosis/scarring from COVID-19.  He is to undergo pulmonary rehabilitation to help with this.  04/14/2023  Charles Wright is seen today in follow-up.  He had called the office because of symptoms of intermittent dizziness and shortness of breath.  Last year he underwent A-fib ablation and had atrial flutter as well was cardioverted.  EKG today shows he is maintaining sinus rhythm.  He reports he has episodes about twice a month where he feels dizziness and somewhat shortness of breath but is noted that his oxygen saturations are between 94 to 96%.  He has pulmonary fibrosis related to COVID-19 unfortunately.  Recently his pulmonologist Dr. Isaiah Serge had recommended starting Ofev.  He has not started this medication because he had a lot of questions about it including side effects.  He says he has been unable to get a response back to his questions.  He is considering getting second opinions.  From a cardiac standpoint he had cardiac catheterization about a year and a half ago which showed actually some improvement in his LAD disease although he had severe distal LAD disease but really is not having any anginal symptoms.  He was not intervened on for that.  He reports that he recently used a chainsaw to cut up wood for about 4 hours last weekend and had no issues with it.  He is 88 years old.  I advised him to be careful because he is on Eliquis.  He did stop aspirin because of diffuse bruising.  PMHx:  Past Medical History:  Diagnosis Date   Acute gout of left ankle    06-14-2015   Bladder cancer (HCC)    BCG's tx's   BPH (benign prostatic hypertrophy)    CAD (coronary artery disease) CARDIOLOGIST-  DR TILLEY   a. NSTEMI 12/2014 -  99% dLAD-2 (not amenable to PCI), 50% dLAD-1, 60% mLAD. Medical therapy was recommended.   Cancer of kidney (HCC)    left   Cough    HARSH  NONPRODUCTIVE COUGH 1 AND 1/2 WEEKS   GERD (gastroesophageal reflux disease)    takes  OTC periodically   Gilbert's syndrome    H/O cardiac catheterization    a. Note: Difficult radial access in 12/2014 - recommend femoral approach if cath needed in the future.   History of kidney stones    History of non-ST elevation myocardial infarction (NSTEMI)  12-12-2014   CARDIAC CATH W/ NO INTERVENTION X 2FEW DAYS APART   History of spinal fracture 2002   LUMBAR   Hyperlipidemia    Hypertension    Hypothyroidism    Myocardial infarction (HCC)    OSA on CPAP    SET ON 7 USES SOME NIGHTS   Paroxysmal A-fib [facility])    cardiologist-  dr Donnie Aho   Prostate cancer [facility]) 2019   last PSA  2.9  (montiored by urologist  dr Mena Goes) Vanessa Kick AND FEB 2019 RAD DONE   Shingles    2 weeks ago   Wears glasses     Past Surgical History:  Procedure Laterality Date   ATRIAL FIBRILLATION ABLATION N/A 04/01/2022   Procedure: ATRIAL FIBRILLATION ABLATION;  Surgeon: Maurice Small, MD;  [facility]: [facility] INVASIVE CV LAB;  Service: Cardiovascular;  Laterality: N/A;   BACK SURGERY  2014   LOWER l 1, 2 4 AND 5   BILATERAL INGUINAL HERNIA REPAIR     CARDIAC CATHETERIZATION N/A 12/13/2014   Procedure: Left Heart Cath and Coronary Angiography;  Surgeon: Marykay Lex, MD;  Location: [facility] INVASIVE CV LAB;  Service: Cardiovascular;  Laterality: N/A;  culprit lesion diat LAD-2  99%, not approachable via PCTA  due to extremely tortuous up stream LAD/  mLAD 60% and dLAD-1 50%, both are at the extremely tortuous segment and not PCI targets/  otherwise normal coronary arteries   CARDIOVERSION N/A 08/09/2022   Procedure: CARDIOVERSION;  Surgeon: Pricilla Riffle, MD;  Location: [facility] INVASIVE CV LAB;  Service: Cardiovascular;  Laterality: N/A;   COLONOSCOPY WITH PROPOFOL N/A 01/16/2019   Procedure: COLONOSCOPY WITH PROPOFOL;  Surgeon: Vida Rigger, MD;  Location: [facility];  Service: Endoscopy;  Laterality: N/A;   CYSTOSCOPY W/  RETROGRADES Left 08/22/2012   Procedure: CYSTOSCOPY WITH RETROGRADE PYELOGRAM;  left kidney washings;  Surgeon: Antony Haste, MD;  Location: [facility];  Service: Urology;  Laterality: Left;   CYSTOSCOPY W/ RETROGRADES N/A 07/08/2015   Procedure: CYSTOSCOPY WITH RETROGRADE PYELOGRAM;  Surgeon: Jerilee Field, MD;  Location: [facility];  Service: Urology;  Laterality: N/A;   CYSTOSCOPY W/ URETERAL STENT PLACEMENT Left 03/24/2019   Procedure: CYSTOSCOPY WITH RETROGRADE PYELOGRAM/URETERAL STENT PLACEMENT ureteroscopy biopsy of neoplasm;  Surgeon: Jerilee Field, MD;  Location: [facility];  Service: Urology;  Laterality: Left;   CYSTOSCOPY W/ URETERAL STENT PLACEMENT Left 04/08/2019   Procedure: CYSTOSCOPY cystogram clot evacuation left ureteroscopy clot evacuation left stent exchange liimited transuretheral resectio of prastate and fulguration  bleeders of prostate;  Surgeon: Jerilee Field, MD;  Location: [facility];  Service: Urology;  Laterality: Left;   CYSTOSCOPY WITH BIOPSY  08/22/2012   Procedure: CYSTOSCOPY WITH BIOPSY;  Surgeon: Antony Haste, MD;  Location: [facility];  Service: Urology;;   CYSTOSCOPY WITH BIOPSY N/A 11/08/2014   Procedure: CYSTOSCOPY/BIOPSPY BLADDER INSTILLATION OF MARCAINE AND PYRIDIUM;  Surgeon: Jerilee Field, MD;  Location: [facility];  Service: Urology;  Laterality: N/A;   CYSTOSCOPY WITH BIOPSY N/A 07/08/2015   Procedure: CYSTOSCOPY WITH BLADDER BIOPSY, FULGURATION, RETROGRADE PYLOGRAM AND RENAL WASHING;  Surgeon: Jerilee Field, MD;  Location: [facility];  Service: Urology;  Laterality: N/A;   CYSTOSCOPY WITH RETROGRADE PYELOGRAM, URETEROSCOPY AND STENT PLACEMENT Bilateral 07/30/2014   Procedure: CYSTOSCOPY WITH BLADDER BX FULERGATION AND BILATERAL RETROGRADE PYELOGRAM,;  Surgeon: Jerilee Field, MD;  Location[facility];  Service: Urology;  Laterality: Bilateral;    CYSTOSCOPY WITH STENT PLACEMENT Left 03/24/2018  Procedure: STENT PLACEMENT AND BIOPSY;  Surgeon: Jerilee Field, MD;  Location: [facility];  Service: Urology;  Laterality: Left;   CYSTOSCOPY/RETROGRADE/URETEROSCOPY Bilateral 08/17/2013   Procedure: CYSTOSCOPY, BLADDER BIOPSY WITH BILATERAL RETROGRADE PYELOGRAM, LEFT URETEROSCOPY AND DILATION OF STRICTURE ;  Surgeon: Jerilee Field, MD;  Location: [facility];  Service: Urology;  Laterality: Bilateral;   CYSTOSCOPY/RETROGRADE/URETEROSCOPY Left 03/24/2018   Procedure: CYSTOSCOPY/RETROGRADE/URETEROSCOPY;  Surgeon: Jerilee Field, MD;  Location: [facility];  Service: Urology;  Laterality: Left;   HOT HEMOSTASIS N/A 01/16/2019   Procedure: HOT HEMOSTASIS (ARGON PLASMA COAGULATION/BICAP);  Surgeon: Vida Rigger, MD;  Location: [facility];  Service: Endoscopy;  Laterality: N/A;   INSERTION OF MESH N/A 08/20/2020   Procedure: INSERTION OF MESH;  Surgeon: Axel Filler, MD;  [facility];  Service: General;  Laterality: N/A;   LEFT ATRIAL APPENDAGE OCCLUSION N/A 01/09/2015   Procedure: LEFT ATRIAL APPENDAGE OCCLUSION;  Surgeon: Hillis Range, MD;  [facility];  Service: Cardiovascular;  Laterality: N/A;   LEFT HEART CATH AND CORONARY ANGIOGRAPHY N/A 11/20/2021   Procedure: LEFT HEART CATH AND CORONARY ANGIOGRAPHY;  Surgeon: Tonny Bollman, MD;  Location: [facility] INVASIVE CV LAB;  Service: Cardiovascular;  Laterality: N/A;   LUMBAR LAMINECTOMY/DECOMPRESSION MICRODISCECTOMY Left 12/03/2013   Procedure: Left Lumbar three-four diskectomy with Lumbar four laminectomy ;  Surgeon: Tressie Stalker, MD;  Location: [facility]O [facility];  Service: Neurosurgery;  Laterality: Left;  Left Lumbar three-four diskectomy with Lumbar four laminectomy    LYMPH NODE DISSECTION Bilateral 04/13/2019   Procedure: LYMPH NODE DISSECTION;  Surgeon: Sebastian Ache, MD;  Location: [facility];  Service: Urology;  Laterality:  Bilateral;   ORIF LEFT ANKLE FX  2005   POLYPECTOMY  01/16/2019   Procedure: POLYPECTOMY;  Surgeon: Vida Rigger, MD;  Location[facility];  Service: Endoscopy;;   PROSTATE BIOPSY N/A 08/22/2012   Procedure: BIOPSY TRANSRECTAL ULTRASONIC PROSTATE (TUBP);  Surgeon: Antony Haste, MD;  Location: [facility];  Service: Urology;  Laterality: N/A;   ROBOT ASSITED LAPAROSCOPIC NEPHROURETERECTOMY Left 04/13/2019   Procedure: XI ROBOT ASSITED LAPAROSCOPIC NEPHROURETERECTOMY;  Surgeon: Sebastian Ache, MD;  Location: [facility];  Service: Urology;  Laterality: Left;  3 HRS   TEE WITHOUT CARDIOVERSION N/A 12/31/2014   Procedure: TRANSESOPHAGEAL ECHOCARDIOGRAM (TEE);  Surgeon: Chrystie Nose, MD;  Location: [facility] ENDOSCOPY;  Service: Cardiovascular;  Laterality: N/A;  ef 55-60%/  mild MR, TR, and PR/  mild LAE and RAE   TEE WITHOUT CARDIOVERSION N/A 04/01/2022   Procedure: TRANSESOPHAGEAL ECHOCARDIOGRAM;  Surgeon: Maurice Small, MD;  [facility];  Service: Cardiovascular;  Laterality: N/A;   TRANSURETHRAL RESECTION OF BLADDER TUMOR  10/22/2011   Procedure: TRANSURETHRAL RESECTION OF BLADDER TUMOR (TURBT);  Surgeon: Antony Haste, MD;  Location: [facility];  Service: Urology;  Laterality: N/A;  TURBT, LEFT URETEROSCOPY, POSSIBLE URETERAL STENT    URETEROSCOPY  10/22/2011   Procedure: URETEROSCOPY;  Surgeon: Antony Haste, MD;  Location: [facility];  Service: Urology;  Laterality: Left;   WISDOM TOOTH EXTRACTION     XI ROBOTIC ASSISTED VENTRAL HERNIA N/A 08/20/2020   Procedure: ROBOTIC INCISIONAL HERNIA REPAIR WITH MESH;  Surgeon: Axel Filler, MD;  Location: [facility] OR;  Service: General;  Laterality: N/A;    FAMHx:  Family History  Problem Relation Age of Onset   Leukemia Mother    Heart attack Father    Cancer Sister  breast   Hypertension Neg Hx    Stroke Neg Hx    Colon cancer Neg Hx    Esophageal  cancer Neg Hx    Rectal cancer Neg Hx    Stomach cancer Neg Hx     SOCHx:   reports that he has never smoked. He has never used smokeless tobacco. He reports current alcohol use of about 7.0 standard drinks of alcohol per week. He reports that he does not use drugs.  ALLERGIES:  Allergies  Allergen Reactions   Bee Venom Anaphylaxis    Actually Hornets and Wasps.    Lisinopril Cough   Losartan Cough        Oxycodone Other (See Comments)    ALTERED MENTAL STATUS GETS MEAN   Prednisone     personality change  Other Reaction(s): personality change    ROS: Pertinent items noted in HPI and remainder of comprehensive ROS otherwise negative.  HOME MEDS: Current Outpatient Medications on File Prior to Visit  Medication Sig Dispense Refill   acetaminophen (TYLENOL) 500 MG tablet Take 1,000 mg by mouth every 6 (six) hours as needed for moderate pain or headache.     allopurinol (ZYLOPRIM) 300 MG tablet Take 300 mg by mouth every morning.      apixaban (ELIQUIS) 5 MG TABS tablet Take 1 tablet (5 mg total) by mouth 2 (two) times daily. 60 tablet 11   aspirin EC 81 MG tablet Take 81 mg by mouth every evening.     Cholecalciferol (VITAMIN D-3) 125 MCG (5000 UT) TABS Take 5,000 Units by mouth daily.     diclofenac Sodium (VOLTAREN) 1 % GEL Apply 4 g topically as needed.     doxycycline (MONODOX) 50 MG capsule Take 50 mg by mouth daily.     EPINEPHrine 0.3 mg/0.3 mL IJ SOAJ injection Inject 0.3 mLs (0.3 mg total) into the muscle once. (Patient taking differently: Inject 0.3 mg into the muscle as needed for anaphylaxis.) 2 Device 1   GLUCOSAMINE SULFATE PO Take 750 mg by mouth daily.     ipratropium (ATROVENT) 0.03 % nasal spray Place 1 spray into both nostrils 2 (two) times daily.     levothyroxine (SYNTHROID, LEVOTHROID) 50 MCG tablet Take 50 mcg by mouth daily before breakfast.     metoprolol tartrate (LOPRESSOR) 25 MG tablet Take 1 tablet (25 mg total) by mouth 2 (two) times daily. 180  tablet 2   Nintedanib (OFEV) 150 MG CAPS Take 1 capsule (150 mg total) by mouth 2 (two) times daily. 180 capsule 1   rosuvastatin (CRESTOR) 20 MG tablet TAKE ONE TABLET BY MOUTH DAILY 90 tablet 3   Testosterone 1.62 % GEL Apply 2 Pump topically every other day. Applied to shoulder area     nitroGLYCERIN (NITROSTAT) 0.4 MG SL tablet Place 1 tablet (0.4 mg total) under the tongue every 5 (five) minutes x 3 doses as needed for chest pain. (Patient not taking: Reported on 04/14/2023) 25 tablet 12   No current facility-administered medications on file prior to visit.    LABS/IMAGING: No results found for this or any previous visit (from the past 48 hours). No results found.  LIPID PANEL:    Component Value Date/Time   CHOL 111 09/01/2021 0947   TRIG 90 09/01/2021 0947   HDL 38 (L) 09/01/2021 0947   CHOLHDL 2.9 09/01/2021 0947   CHOLHDL 4.4 12/12/2014 1721   VLDL 18 12/12/2014 1721   LDLCALC 55 09/01/2021 0947   LDLDIRECT 152 (H) 05/14/2021  1428     WEIGHTS: Wt Readings from Last 3 Encounters:  04/14/23 192 lb (87.1 kg)  03/15/23 188 lb 11.4 oz (85.6 kg)  03/01/23 190 lb 7.6 oz (86.4 kg)    VITALS: BP 116/70 (BP Location: Left Arm, Patient Position: Sitting, Cuff Size: Normal)   Pulse 68   Ht 5\' 7"  (1.702 m)   Wt 192 lb (87.1 kg)   SpO2 97%   BMI 30.07 kg/m   EXAM: General appearance: alert and no distress Neck: no carotid bruit, no JVD and thyroid not enlarged, symmetric, no tenderness/mass/nodules Lungs: rales bibasilar Heart: regular rate and rhythm Abdomen: soft, non-tender; bowel sounds normal; no masses,  no organomegaly and obese Extremities: extremities normal, atraumatic, no cyanosis or edema Pulses: 2+ and symmetric Skin: Skin color, texture, turgor normal. No rashes or lesions Neurologic: Grossly normal : Pleasant  EKG: EKG Interpretation Date/Time:  Thursday April 14 2023 10:13:34 EST Ventricular Rate:  68 PR Interval:  202 QRS Duration:  92 QT  Interval:  390 QTC Calculation: 414 R Axis:   15  Text Interpretation: Normal sinus rhythm Normal ECG When compared with ECG of 22-Nov-2022 13:03, Premature ventricular complexes are no longer Present Premature atrial complexes are no longer Present Confirmed by Zoila Shutter [phone]) on 04/14/2023 10:17:45 AM   ASSESSMENT: PAF status post catheter ablation (2024) History of NSTEMI - found to have multivessel coronary artery disease, including 99% LAD distal stenosis (not amenable to PCI) and 50 to 60% mid LAD stenosis of a tortuous vessel (12/2014), without angina History of bladder CA with hematuria, resolved Failed Watchman LAA procedure in 2016 History of hypertensive heart disease PAF - CHADVASC score of 5, on Eliquis Obesity Hyperlipidemia OSA on CPAP History of TIA/stroke Hypothyroidism Possible left upper tract urothelial carcinoma-status post resection and radical left nephro ureterectomy. Interstitial lung disease -multiple COVID-19 infections Frequent PVCs -ventricular bigeminy  PLAN: 1.   Mr. Rutigliano has been having intermittent symptoms of shortness of breath and dizziness.  I wonder if this could be breakthrough A-fib or flutter.  EKG is normal today.  I advised her to purchase a Kardia mobile device for point-of-care evaluation when he becomes symptomatic.  He noted that during these episodes his oxygen levels are generally around 95%.  Recently he was advised to start on Ofev.  He has some questions about the medication.  I do have several patients on this and we will see to be tolerating it.  I have encouraged him to reach out again to Dr. Isaiah Serge and he has an appointment in April.  He is also contemplating a second opinion regarding management.  I have encouraged him to send me results from his Kardia mobile and I am happy to review them when he needs me to.  Follow-up with me APP in 6 months or sooner as necessary.  Chrystie Nose, MD, [facility], FACP  [place]  [facility]  HeartCare  Medical Director of the Advanced Lipid Disorders &  Cardiovascular Risk Reduction Clinic Diplomate of the American Board of Clinical Lipidology Attending Cardiologist  Direct Dial: [phone]  Fax: [phone]  Website:  [email or web address]   Lisette Abu Faisal Stradling 04/14/2023, 10:17 AM

## 2023-04-15 DIAGNOSIS — Z8709 Personal history of other diseases of the respiratory system: Secondary | ICD-10-CM | POA: Diagnosis not present

## 2023-04-18 ENCOUNTER — Telehealth: Payer: Self-pay | Admitting: Pulmonary Disease

## 2023-04-18 NOTE — Telephone Encounter (Signed)
 Patient would like directions on how to use Ofev,. Patient phone number is [phone].

## 2023-04-19 NOTE — Telephone Encounter (Signed)
 Returned call to patient regarding Ofev. Left VM with detailed direction. MyChart message sent to patient  Chesley Mires, PharmD, MPH, BCPS, CPP Clinical Pharmacist (Rheumatology and Pulmonology)

## 2023-04-21 DIAGNOSIS — K080 Exfoliation of teeth due to systemic causes: Secondary | ICD-10-CM | POA: Diagnosis not present

## 2023-04-26 DIAGNOSIS — M48062 Spinal stenosis, lumbar region with neurogenic claudication: Secondary | ICD-10-CM | POA: Diagnosis not present

## 2023-04-26 DIAGNOSIS — M48061 Spinal stenosis, lumbar region without neurogenic claudication: Secondary | ICD-10-CM | POA: Diagnosis not present

## 2023-04-28 ENCOUNTER — Ambulatory Visit (INDEPENDENT_AMBULATORY_CARE_PROVIDER_SITE_OTHER): Admitting: Pharmacist

## 2023-04-28 DIAGNOSIS — Z5181 Encounter for therapeutic drug level monitoring: Secondary | ICD-10-CM | POA: Diagnosis not present

## 2023-04-28 DIAGNOSIS — J849 Interstitial pulmonary disease, unspecified: Secondary | ICD-10-CM | POA: Diagnosis not present

## 2023-04-28 DIAGNOSIS — Z7189 Other specified counseling: Secondary | ICD-10-CM | POA: Diagnosis not present

## 2023-04-28 NOTE — Patient Instructions (Addendum)
 Repeat labs monthly for the first three months while on Ofev. Then repeat every 3 months We need to monitor your liver function  Take Ofev with food - breakfast and dinner Keep loperamide (Imodium) on hand for diarrhea  Call [facility] Patient [employer] program to enroll into a program that can help with medication education and ongoing side effect management: Phone: [phone]

## 2023-04-28 NOTE — Progress Notes (Addendum)
 Subjective:  Patient called today by [facility] pharmacy team for Ofev new start counseling.   Patient was last seen by Dr. Isaiah Serge on 02/23/2023. Pertinent past medical history includes CAD, paroxysmal atrial fibrillation, sleep apnea. History of prostate cancer and bladder cancer.  He would like to discuss Ofev and is concerned with being prescribed a medication without clear education.  He reports that he tolerates medications relatively well  History of CAD: Yes History of MI: No Current anticoagulant use: Yes. Eliquis History of HTN: No  History of elevated LFTs: No - total bili slightly elevated but his AST/ALT and alk phos are wnl History of diarrhea, nausea, vomiting: No  Objective: Allergies  Allergen Reactions   Bee Venom Anaphylaxis    Actually Hornets and Wasps.    Lisinopril Cough   Losartan Cough        Oxycodone Other (See Comments)    ALTERED MENTAL STATUS GETS MEAN   Prednisone     personality change  Other Reaction(s): personality change    Outpatient Encounter Medications as of 04/28/2023  Medication Sig Note   acetaminophen (TYLENOL) 500 MG tablet Take 1,000 mg by mouth every 6 (six) hours as needed for moderate pain or headache.    allopurinol (ZYLOPRIM) 300 MG tablet Take 300 mg by mouth every morning.     apixaban (ELIQUIS) 5 MG TABS tablet Take 1 tablet (5 mg total) by mouth 2 (two) times daily.    aspirin EC 81 MG tablet Take 81 mg by mouth every evening.    Cholecalciferol (VITAMIN D-3) 125 MCG (5000 UT) TABS Take 5,000 Units by mouth daily.    diclofenac Sodium (VOLTAREN) 1 % GEL Apply 4 g topically as needed.    doxycycline (MONODOX) 50 MG capsule Take 50 mg by mouth daily.    EPINEPHrine 0.3 mg/0.3 mL IJ SOAJ injection Inject 0.3 mLs (0.3 mg total) into the muscle once. (Patient taking differently: Inject 0.3 mg into the muscle as needed for anaphylaxis.)    GLUCOSAMINE SULFATE PO Take 750 mg by mouth daily.    ipratropium (ATROVENT) 0.03  % nasal spray Place 1 spray into both nostrils 2 (two) times daily.    levothyroxine (SYNTHROID, LEVOTHROID) 50 MCG tablet Take 50 mcg by mouth daily before breakfast.    metoprolol tartrate (LOPRESSOR) 25 MG tablet Take 1 tablet (25 mg total) by mouth 2 (two) times daily.    Nintedanib (OFEV) 150 MG CAPS Take 1 capsule (150 mg total) by mouth 2 (two) times daily.    nitroGLYCERIN (NITROSTAT) 0.4 MG SL tablet Place 1 tablet (0.4 mg total) under the tongue every 5 (five) minutes x 3 doses as needed for chest pain. (Patient not taking: Reported on 04/14/2023) 04/14/2023: Refill needed   rosuvastatin (CRESTOR) 20 MG tablet TAKE ONE TABLET BY MOUTH DAILY    Testosterone 1.62 % GEL Apply 2 Pump topically every other day. Applied to shoulder area    No facility-administered encounter medications on file as of 04/28/2023.     Immunization History  Administered Date(s) Administered   Fluad Quad(high Dose 65+) 12/26/2019   Fluad Trivalent(High Dose 65+) 10/27/2022   Hepatitis A, Adult 07/31/1998, 03/09/1999   Hepatitis B, PED/ADOLESCENT 07/31/1998, 09/08/1998, 03/09/1999   IPV 04/03/2004   Influenza Split 12/15/2007, 10/30/2009, 12/09/2012, 11/08/2016, 12/15/2017   Influenza, High Dose Seasonal PF 11/08/2013, 11/10/2015, 12/15/2017, 11/15/2018, 11/08/2019   Influenza,inj,Quad PF,6+ Mos 10/28/2014   Meningococcal Conjugate 04/03/2004   PFIZER(Purple Top)SARS-COV-2 Vaccination 04/05/2019, 04/25/2019, 04/07/2020   Pneumococcal  Conjugate-13 03/21/2014   Pneumococcal Polysaccharide-23 03/12/2003   Pneumococcal-Unspecified 03/12/2003   Td 12/15/2007   Tdap 03/21/2014   Yellow Fever 07/31/1998   Zoster, Live 02/09/2007      PFT's TLC  Date Value Ref Range Status  03/07/2020 5.38 L Final      CMP     Component Value Date/Time   NA 141 02/23/2023 1552   NA 140 03/11/2022 1148   K 4.6 02/23/2023 1552   CL 105 02/23/2023 1552   CO2 28 02/23/2023 1552   GLUCOSE 85 02/23/2023 1552   BUN 24 (H)  02/23/2023 1552   BUN 19 03/11/2022 1148   CREATININE 1.45 02/23/2023 1552   CREATININE 1.35 (H) 05/07/2020 0901   CALCIUM 9.5 02/23/2023 1552   PROT 6.5 02/23/2023 1552   PROT 6.2 09/01/2021 0947   ALBUMIN 4.1 02/23/2023 1552   ALBUMIN 4.3 09/01/2021 0947   AST 25 02/23/2023 1552   ALT 15 02/23/2023 1552   ALKPHOS 65 02/23/2023 1552   BILITOT 1.3 (H) 02/23/2023 1552   BILITOT 1.7 (H) 09/01/2021 0947   GFRNONAA 43 (L) 08/13/2020 1215   GFRNONAA 48 (L) 05/07/2020 0901   GFRAA 56 (L) 05/07/2020 0901      CBC    Component Value Date/Time   WBC 9.0 02/23/2023 1552   RBC 5.09 02/23/2023 1552   HGB 15.6 02/23/2023 1552   HGB 16.3 03/11/2022 1148   HCT 48.3 02/23/2023 1552   HCT 47.7 03/11/2022 1148   PLT 273.0 02/23/2023 1552   PLT 264 03/11/2022 1148   MCV 94.9 02/23/2023 1552   MCV 89 03/11/2022 1148   MCH 30.4 03/11/2022 1148   MCH 25.4 (L) 08/21/2020 0229   MCHC 32.4 02/23/2023 1552   RDW 15.1 02/23/2023 1552   RDW 13.1 03/11/2022 1148   LYMPHSABS 1.5 02/23/2023 1552   LYMPHSABS 1.6 03/11/2022 1148   MONOABS 1.0 02/23/2023 1552   EOSABS 0.4 02/23/2023 1552   EOSABS 0.3 03/11/2022 1148   BASOSABS 0.1 02/23/2023 1552   BASOSABS 0.1 03/11/2022 1148      LFT's    Latest Ref Rng & Units 02/23/2023    3:52 PM 09/01/2021    9:47 AM 05/14/2021    2:28 PM  Hepatic Function  Total Protein 6.0 - 8.3 g/dL 6.5  6.2  6.4   Albumin 3.5 - 5.2 g/dL 4.1  4.3  4.3   AST 0 - 37 U/L 25  27  29    ALT 0 - 53 U/L 15  17  21    Alk Phosphatase 39 - 117 U/L 65  69  76   Total Bilirubin 0.2 - 1.2 mg/dL 1.3  1.7  1.6   Bilirubin, Direct 0.00 - 0.40 mg/dL  1.61      HRCT (10/14/452) - 1. Pulmonary parenchymal pattern of fibrosis, as detailed above, progressive from 05/12/2020. Findings are categorized as probable UIP per consensus guidelines: Diagnosis of Idiopathic Pulmonary Fibrosis: An Official ATS/ERS/JRS/ALAT Clinical Practice Guideline. Am Rosezetta Schlatter Crit Care Med Vol 198, Iss 5,  [phone], Oct 09 2016. 2. New 4 mm lateral segment right middle lobe nodule. Although likely benign, as the patient is at increased risk for bronchogenic carcinoma, follow-up CT chest without contrast in 12 months could be performed as clinically warranted.This recommendation follows the consensus statement: Guidelines for Management of Incidental Pulmonary Nodules Detected on CT Images: From the Fleischner Society 2017; Radiology 2017; 284:228-243. 3. Tiny right renal stone. 4.  Aortic atherosclerosis (ICD10-I70.0).  Assessment and Plan  Ofev Medication Management Thoroughly counseled patient on the efficacy, mechanism of action, dosing, administration, adverse effects, and monitoring parameters of Ofev. Patient verbalized understanding.  Goals of Therapy: Will not stop or reverse the progression of ILD. It will slow the progression of ILD.  Inhibits tyrosine kinase inhibitors which slow the fibrosis/progression of ILD -Significant reduction in the rate of disease progression was observed after treatment (61.1% [before] vs 33.3% [after], P?=?0.008) over 42 weeks.  Dosing: 150 mg (one capsule) by mouth twice daily (approx 12 hours apart). Discussed taking with food approximately 12 hours apart. Discussed that capsule should not be crushed or split.  Adverse Effects: Nausea, vomiting, diarrhea (2 in 3 patients) appetite loss, weight loss - management of diarrhea with loperamide discussed including max use of 48 hours and max of 8 capsules per day. Abdominal pain (up to 1 in 5 patients) Risk of thrombosis (3%) and acute MI (2%) Hypertension (5%) Dizziness Fatigue (10%)  Monitoring: Monitor for diarrhea, nausea and vomiting, GI perforation, hepatotoxicity  Monitor LFTs - baseline, monthly for first 3 months, then every 3 months routinely Future CMET order placed today..  Access: Approval of Ofev through: patient assistance Rx sent to: [facility] (Ofev): [phone] and patient has  received shipment  Medication Reconciliation A drug regimen assessment was performed, including review of allergies, interactions, disease-state management, dosing and immunization history. Medications were reviewed with the patient, including name, instructions, indication, goals of therapy, potential side effects, importance of adherence, and safe use.  Anticoagulant use: Yes  This appointment required 30 minutes of patient care (this includes precharting, chart review, review of results, face-to-face care, etc.).  Thank you for involving pharmacy to assist in providing this patient's care.    Chesley Mires, PharmD, MPH, BCPS, CPP Clinical Pharmacist (Rheumatology and Pulmonology)

## 2023-05-04 ENCOUNTER — Other Ambulatory Visit (HOSPITAL_BASED_OUTPATIENT_CLINIC_OR_DEPARTMENT_OTHER): Payer: Self-pay | Admitting: Family

## 2023-05-04 DIAGNOSIS — C61 Malignant neoplasm of prostate: Secondary | ICD-10-CM | POA: Diagnosis not present

## 2023-05-04 DIAGNOSIS — E349 Endocrine disorder, unspecified: Secondary | ICD-10-CM | POA: Diagnosis not present

## 2023-05-11 ENCOUNTER — Encounter: Payer: Self-pay | Admitting: Internal Medicine

## 2023-05-11 DIAGNOSIS — Z8551 Personal history of malignant neoplasm of bladder: Secondary | ICD-10-CM | POA: Diagnosis not present

## 2023-05-12 DIAGNOSIS — J849 Interstitial pulmonary disease, unspecified: Secondary | ICD-10-CM | POA: Diagnosis not present

## 2023-05-30 ENCOUNTER — Ambulatory Visit: Payer: Medicare Other | Admitting: Pulmonary Disease

## 2023-06-02 ENCOUNTER — Ambulatory Visit: Admitting: Pulmonary Disease

## 2023-06-02 ENCOUNTER — Encounter: Payer: Self-pay | Admitting: Pulmonary Disease

## 2023-06-02 VITALS — BP 116/68 | HR 69 | Temp 97.5°F | Ht 67.0 in | Wt 190.8 lb

## 2023-06-02 DIAGNOSIS — R059 Cough, unspecified: Secondary | ICD-10-CM | POA: Diagnosis not present

## 2023-06-02 DIAGNOSIS — J849 Interstitial pulmonary disease, unspecified: Secondary | ICD-10-CM

## 2023-06-02 DIAGNOSIS — Z5181 Encounter for therapeutic drug level monitoring: Secondary | ICD-10-CM | POA: Diagnosis not present

## 2023-06-02 DIAGNOSIS — Z9981 Dependence on supplemental oxygen: Secondary | ICD-10-CM

## 2023-06-02 DIAGNOSIS — R911 Solitary pulmonary nodule: Secondary | ICD-10-CM | POA: Diagnosis not present

## 2023-06-02 LAB — COMPREHENSIVE METABOLIC PANEL WITH GFR
ALT: 19 U/L (ref 0–53)
AST: 29 U/L (ref 0–37)
Albumin: 4.1 g/dL (ref 3.5–5.2)
Alkaline Phosphatase: 71 U/L (ref 39–117)
BUN: 22 mg/dL (ref 6–23)
CO2: 23 meq/L (ref 19–32)
Calcium: 9.7 mg/dL (ref 8.4–10.5)
Chloride: 103 meq/L (ref 96–112)
Creatinine, Ser: 1.37 mg/dL (ref 0.40–1.50)
GFR: 46.82 mL/min — ABNORMAL LOW (ref >60.00)
Glucose, Bld: 103 mg/dL — ABNORMAL HIGH (ref 70–99)
Potassium: 4.3 meq/L (ref 3.5–5.1)
Sodium: 134 meq/L — ABNORMAL LOW (ref 135–145)
Total Bilirubin: 1.8 mg/dL — ABNORMAL HIGH (ref 0.2–1.2)
Total Protein: 6.7 g/dL (ref 6.0–8.3)

## 2023-06-02 MED ORDER — BENZONATATE 200 MG PO CAPS: 200.0000 mg | ORAL_CAPSULE | Freq: Three times a day (TID) | ORAL | 1 refills | Status: DC | PRN

## 2023-06-02 NOTE — Patient Instructions (Signed)
 VISIT SUMMARY:  You came in today for a follow-up on your pulmonary fibrosis treatment with Ofev . We discussed your experience with the medication, your oxygen  therapy, and your persistent cough. You are currently tolerating Ofev  well and maintaining an active lifestyle, which is beneficial for your condition.  YOUR PLAN:  -IDIOPATHIC PULMONARY FIBROSIS (IPF): IPF is a condition where the lungs become scarred and breathing becomes increasingly difficult. You are currently taking Ofev , which is intended to slow the progression of IPF. Continue taking Ofev  150 mg twice daily. We will monitor your liver function with tests today and monthly for the first six months. Keep up with your regular exercise and pulmonary rehabilitation exercises as they are beneficial for managing IPF.  -COUGH DUE TO LUNG SCARRING: Your persistent cough is likely due to lung scarring from IPF. You have been using lozenges for relief but are seeking additional options. We discussed using anti-acid medication, but you declined. Instead, we will try a cough suppressant. I have prescribed Tessalon  for you to help manage the cough.  -OXYGEN  THERAPY FOR HYPOXEMIA: Hypoxemia is a condition where there is a lower than normal level of oxygen  in your blood. You have been prescribed oxygen  therapy due to low oxygen  levels noted during pulmonary rehabilitation. It is important to use your oxygen  therapy at night to prevent your oxygen  levels from dropping during deep sleep.  INSTRUCTIONS:  Please continue taking Ofev  150 mg twice daily and use your oxygen  therapy at night. We will conduct liver function tests today and then monthly for the first six months. Additionally, start taking Tessalon  as prescribed for your cough. Keep up with your regular exercise and pulmonary rehabilitation exercises. Follow up with us  if you experience any new or worsening symptoms.

## 2023-06-02 NOTE — Progress Notes (Signed)
 Charles Wright    [id number]    17-Dec-1935  Primary Care Physician:Costella, Thedora Finlay, PA-C (Inactive)  Referring Physician: No referring provider defined for this encounter.  Chief complaint: Follow up for ILD  HPI: 88 y.o.  with history of prostate, bladder, renal cancer, atrial fibrillation, coronary artery disease, sleep apnea Has complains of chronic cough, nonproductive in nature for the past 1 year.  Not associated with dyspnea or congestion He has been tried on Protonix  for empiric treatment of GERD with no improvement  He had a high-res CT recently which showed progressive probable UIP pattern pulmonary fibrosis and has been referred to clinic for further evaluation  Reports a viral illness in January 2020 after attending a conference in [place].  He was not tested for Covid at that time In 2022 we had referred him to rheumatology for elevated ANA.  Seen by Dr. Alvira Josephs who felt he did not have any interstitial lung disease Also saw GI for acid reflux with no ongoing symptoms  Pets: Dogs, cats.  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  No asbestos, mold, hot tub, Jacuzzi or down pillows, comforters. ILD questionnaire 01/14/20-negative except as above. Smoking history: Occasionally smokes cigarettes and pipe in college Travel history: No significant travel Relevant family history: No significant family history of lung disease.  Interval history: Discussed the use of AI scribe software for clinical note transcription with the patient, who gave verbal consent to proceed.  History of Present Illness Charles Wright is an 88 year old male with pulmonary fibrosis who presents for follow-up on Ofev  treatment.  He started Ofev  on April 1st for pulmonary fibrosis. Initially, he experienced significant side effects, describing it as 'knocked me down,'  but subsequently had no symptoms such as vomiting, nausea, or diarrhea. He paused the medication while traveling to [place]  from April 15th to April 21st and resumed it upon return, experiencing only mild diarrhea which has since resolved. He is currently taking 150 mg twice a day and tolerating it well.  He completed pulmonary rehab in February, which he found beneficial. During rehab, low oxygen  levels were noted, leading to the prescription of oxygen  therapy. He has the equipment set up at home and uses it as needed. He did not take it to [place]  and noticed a difference in altitude. He has not regularly used it at night.  He has a persistent cough that is bothersome during the day. He finds relief using Halls lozenges but is seeking alternatives to manage the cough. No acid reflux symptoms are present.  He maintains an active lifestyle, having joined a club for exercise and continues to walk regularly. He has a long-term commitment to visit a ranch in [place]  in the future.   Outpatient Encounter Medications as of 06/02/2023  Medication Sig   acetaminophen  (TYLENOL ) 500 MG tablet Take 1,000 mg by mouth every 6 (six) hours as needed for moderate pain or headache.   allopurinol  (ZYLOPRIM ) 300 MG tablet Take 300 mg by mouth every morning.    apixaban  (ELIQUIS ) 5 MG TABS tablet Take 1 tablet (5 mg total) by mouth 2 (two) times daily.   aspirin  EC 81 MG tablet Take 81 mg by mouth every evening.   Cholecalciferol (VITAMIN D-3) 125 MCG (5000 UT) TABS Take 5,000 Units by mouth daily.   diclofenac Sodium (VOLTAREN) 1 %  GEL Apply 4 g topically as needed.   EPINEPHrine  0.3 mg/0.3 mL IJ SOAJ injection Inject 0.3 mLs (0.3 mg total) into the muscle once. (Patient taking differently: Inject 0.3 mg into the muscle as needed for anaphylaxis.)   GLUCOSAMINE SULFATE PO Take 750 mg by mouth daily.   ipratropium (ATROVENT) 0.03 % nasal spray Place 1 spray into both nostrils 2 (two) times daily.   levothyroxine   (SYNTHROID , LEVOTHROID) 50 MCG tablet Take 50 mcg by mouth daily before breakfast.   metoprolol  tartrate (LOPRESSOR ) 25 MG tablet Take 1 tablet (25 mg total) by mouth 2 (two) times daily.   Nintedanib (OFEV ) 150 MG CAPS Take 1 capsule (150 mg total) by mouth 2 (two) times daily.   nitroGLYCERIN  (NITROSTAT ) 0.4 MG SL tablet Place 1 tablet (0.4 mg total) under the tongue every 5 (five) minutes x 3 doses as needed for chest pain.   rosuvastatin  (CRESTOR ) 20 MG tablet TAKE 1 TABLET BY MOUTH DAILY   Testosterone  1.62 % GEL Apply 2 Pump topically every other day. Applied to shoulder area   doxycycline (MONODOX) 50 MG capsule Take 50 mg by mouth daily. (Patient not taking: Reported on 06/02/2023)   No facility-administered encounter medications on file as of 06/02/2023.    Physical Exam: Blood pressure 116/68, pulse 69, temperature (!) 97.5 F (36.4 C), temperature source Temporal, height 5\' 7"  (1.702 m), weight 190 lb 12.8 oz (86.5 kg), SpO2 96%. Gen:      No acute distress HEENT:  EOMI, sclera anicteric Neck:     No masses; no thyromegaly Lungs:    Clear to auscultation bilaterally; normal respiratory effort CV:         Regular rate and rhythm; no murmurs Abd:      + bowel sounds; soft, non-tender; no palpable masses, no distension Ext:    No edema; adequate peripheral perfusion Skin:      Warm and dry; no rash Neuro: alert and oriented x 3 Psych: normal mood and affect   Data Reviewed: Imaging: CT abdomen pelvis 03/18/2019 -mild interstitial changes at the base.  High-res CT 12/07/2019-left upper lobe pulmonary nodule measuring 5 mm, basilar predominant fibrosis and probable UIP pattern.  Progressive compared to prior scans.  High resolution CT 11/10/2022-progressive pulmonary fibrosis and probable UIP pattern.  4 mm right middle lobe nodule I have reviewed the images personally.  PFTs: 06/18/2015 FVC 3.94 (6%), FEV1 3.11 (18%), F/F 79, TLC 6.46 [90%], DLCO 24.74 [85%] Normal  test  03/07/2020 FVC 3.73 [105%], FEV1 2.94 (119%], F/F 79, TLC 5.38 [81%], DLCO 15.99 [71%] Mild diffusion defect  Labs:  ANA serologies 01/14/2020-positive for ANA 1 is to 80, p-ANCA 1 is to 80 Assessment & Plan Idiopathic Pulmonary Fibrosis (IPF) Review of CT scan shows probable UIP pattern fibrosis.  He has minimal changes at the base dating back to at least 2012 on CTs of the abdomen.  It is more clearly progressive since his last CT abdomen in 2020.  This is suggestive of IPF in the absence of any exposures or CTD symptoms  CTD serologies reviewed with mild elevation in ANA, ANCA.  This may be nonspecific Rheumatology referral completed with no evidence of autoimmune process  Has a diagnosis of IPF with initiation of Ofev  (nintedanib) on April 1st. Initial adverse reaction to the first dose, but subsequent doses well-tolerated with only mild diarrhea, which resolved. Ofev  is intended to slow IPF progression, though individual responses vary. He is engaging in regular exercise, beneficial for managing IPF.  Discussed that Ofev  may stabilize the condition in some patients, though outcomes vary. - Continue Ofev  150 mg twice daily. - Order liver function tests today and monthly for the first six months. - Encourage continuation of regular exercise and pulmonary rehabilitation exercises.  Cough due to lung scarring Persistent cough likely secondary to lung scarring from IPF. No symptoms of acid reflux reported. He finds relief with lozenges but seeks additional management options. Discussed potential use of anti-acid medication, but he declined. Agreed to try cough suppressant. - Prescribe Tessalon  for cough suppression.  Oxygen  therapy for hypoxemia Oxygen  therapy prescribed due to hypoxemia noted during pulmonary rehabilitation. He has equipment but has not been using it regularly. Advised to use oxygen  at night due to potential nocturnal desaturation, as oxygen  levels may drop during deep  sleep. - Advise use of oxygen  therapy at night to prevent nocturnal desaturation.  Lung Nodule Small lung nodule identified on CT scan, not likely related to patient's historical chest pain. Plan for follow-up in one year. -Schedule follow-up CT scan in 1 year.  Plan/Recommendations: Continue Ofev  Labs for monitoring  Phyllis Breeze MD [facility] 06/02/2023, 11:27 AM  CC: No ref. provider found

## 2023-06-08 ENCOUNTER — Other Ambulatory Visit: Payer: Self-pay

## 2023-06-08 MED ORDER — METOPROLOL TARTRATE 25 MG PO TABS: 25.0000 mg | ORAL_TABLET | Freq: Two times a day (BID) | ORAL | 3 refills | Status: DC

## 2023-06-10 ENCOUNTER — Encounter (HOSPITAL_BASED_OUTPATIENT_CLINIC_OR_DEPARTMENT_OTHER): Payer: Self-pay | Admitting: Internal Medicine

## 2023-06-11 DIAGNOSIS — J849 Interstitial pulmonary disease, unspecified: Secondary | ICD-10-CM | POA: Diagnosis not present

## 2023-06-13 DIAGNOSIS — K080 Exfoliation of teeth due to systemic causes: Secondary | ICD-10-CM | POA: Diagnosis not present

## 2023-06-13 NOTE — Telephone Encounter (Signed)
 Spoke with patient regarding appointment Per patient "things have settled down quite a bit"  He is unable to make an appointment in [place] Explained no openings at either of the [place] locations this week or next  Patient stated he could wait for appointment and be added to cancellation list   Will forward to Dr Maximo Spar for review

## 2023-06-13 NOTE — Progress Notes (Deleted)
 Cardiology Office Note:    Date:  06/13/2023   ID:  Charles Wright, DOB Nov 06, 1935, MRN [id number]  PCP:  Glenis Langdon (Inactive)   [facility] Providers Cardiologist:  Hazle Lites, MD Electrophysiologist:  Efraim Grange, MD     Referring MD: No ref. provider found      History of Present Illness:    Charles Wright is a 88 y.o. male with a hx of PAF, hypertension, CAD (NSTEMI 2016 LAD dx not amenable to PCI, moderate mid >proximal LAD rec med therapy), OSA on unable to tolerate CPAP, history of bladder cancer, history of prostate cancer, HLD, palpitations, ILD follows with [facility].  08/09/2022 DCCV 04/01/2022 echo EF 55 to 60%, concentric LVH bilobed left atrial appendage, PFO present with left-to-right flow, mild to moderate MR, mild calcification of the aortic valve 04/01/2022 atrial fibrillation ablation 03/26/2022 cardiac CT, calcium  score 405, no AVG base comparison secondary to age 51/13/2023 cardiac cath non-obstructive CAD with tapering narrowing up to 80% distal LAD  Former patient of Dr. Anastasia Balo, establish care with Dr. Maximo Spar in 2020.  He had a stress test in 2021 that was performed which showed small fixed apical defect without any ischemia, consistent with known distal LAD severe stenosis. Initially diagnosed with atrial fibrillation sometime around 2020, was managed for some time on amiodarone .  He was subsequently diagnosed with IPF and was no longer taking amiodarone .  Continue to have episodes of palpitations that were bothersome and could wake him up at night. He wore a monitor which showed some recurrent atrial fibrillation/flutter and SVT with a low burden of about 3%. His metoprolol  was then increased up to 50 mg twice daily. LHC in October 2023, showed stable LAD stenosis, nonobstructive proximal and mid LAD which was improved from previous cath. Underwent atrial fibrillation ablation with Dr. Arlester Ladd on 04/01/22.  TEE at this time showed normal LV  size with mild LV hypertrophy.  EF 55-60%.  Normal RV size and systolic function.  Mild left atrial enlargement.  Large, bilobed LA appendage with no thrombus.  Normal RA with Chiari network.  There was a PFO present with left to right flow.  Trivial TR.  Mild-moderate MR.  Calcified, trileaflet aortic valve with no stenosis, mild regurgitation.  Normal caliber thoracic aorta with mild plaque.      Past Medical History:  Diagnosis Date   Acute gout of left ankle    06-14-2015   Bladder cancer (HCC)    BCG's tx's   BPH (benign prostatic hypertrophy)    CAD (coronary artery disease) CARDIOLOGIST-  DR TILLEY   a. NSTEMI 12/2014 -  99% dLAD-2 (not amenable to PCI), 50% dLAD-1, 60% mLAD. Medical therapy was recommended.   Cancer of kidney (HCC)    left   Cough    HARSH NONPRODUCTIVE COUGH 1 AND 1/2 WEEKS   GERD (gastroesophageal reflux disease)    takes  OTC periodically   Gilbert's syndrome    H/O cardiac catheterization    a. Note: Difficult radial access in 12/2014 - recommend femoral approach if cath needed in the future.   History of kidney stones    History of non-ST elevation myocardial infarction (NSTEMI)    12-12-2014   CARDIAC CATH W/ NO INTERVENTION X 2FEW DAYS APART   History of spinal fracture 2002   LUMBAR   Hyperlipidemia    Hypertension    Hypothyroidism    Myocardial infarction (HCC)    OSA on CPAP  SET ON 7 USES SOME NIGHTS   Paroxysmal A-fib [facility])    cardiologist-  dr Anastasia Balo   Prostate cancer [facility]) 2019   last PSA  2.9  (montiored by urologist  dr Derrick Fling) [place] AND FEB 2019 RAD DONE   Shingles    2 weeks ago   Wears glasses     Past Surgical History:  Procedure Laterality Date   ATRIAL FIBRILLATION ABLATION N/A 04/01/2022   Procedure: ATRIAL FIBRILLATION ABLATION;  Surgeon: Efraim Grange, MD;  Location: [facility] INVASIVE CV LAB;  Service: Cardiovascular;  Laterality: N/A;   BACK SURGERY  2014   LOWER l 1, 2 4 AND 5   BILATERAL INGUINAL HERNIA REPAIR      CARDIAC CATHETERIZATION N/A 12/13/2014   Procedure: Left Heart Cath and Coronary Angiography;  Surgeon: Arleen Lacer, MD;  Location: [facility] INVASIVE CV LAB;  Service: Cardiovascular;  Laterality: N/A;  culprit lesion diat LAD-2  99%, not approachable via PCTA  due to extremely tortuous up stream LAD/  mLAD 60% and dLAD-1 50%, both are at the extremely tortuous segment and not PCI targets/  otherwise normal coronary arteries   CARDIOVERSION N/A 08/09/2022   Procedure: CARDIOVERSION;  Surgeon: Elmyra Haggard, MD;  Location: [facility] INVASIVE CV LAB;  Service: Cardiovascular;  Laterality: N/A;   COLONOSCOPY WITH PROPOFOL  N/A 01/16/2019   Procedure: COLONOSCOPY WITH PROPOFOL ;  Surgeon: Ozell Blunt, MD;  Location: [facility] ENDOSCOPY;  Service: Endoscopy;  Laterality: N/A;   CYSTOSCOPY W/ RETROGRADES Left 08/22/2012   Procedure: CYSTOSCOPY WITH RETROGRADE PYELOGRAM;  left kidney washings;  Surgeon: Moise Anes, MD;  Location: [facility];  Service: Urology;  Laterality: Left;   CYSTOSCOPY W/ RETROGRADES N/A 07/08/2015   Procedure: CYSTOSCOPY WITH RETROGRADE PYELOGRAM;  Surgeon: Christina Coyer, MD;  Location: [facility];  Service: Urology;  Laterality: N/A;   CYSTOSCOPY W/ URETERAL STENT PLACEMENT Left 03/24/2019   Procedure: CYSTOSCOPY WITH RETROGRADE PYELOGRAM/URETERAL STENT PLACEMENT ureteroscopy biopsy of neoplasm;  Surgeon: Christina Coyer, MD;  Location: [facility];  Service: Urology;  Laterality: Left;   CYSTOSCOPY W/ URETERAL STENT PLACEMENT Left 04/08/2019   Procedure: CYSTOSCOPY cystogram clot evacuation left ureteroscopy clot evacuation left stent exchange liimited transuretheral resectio of prastate and fulguration  bleeders of prostate;  Surgeon: Christina Coyer, MD;  Location: [facility];  Service: Urology;  Laterality: Left;   CYSTOSCOPY WITH BIOPSY  08/22/2012   Procedure: CYSTOSCOPY WITH BIOPSY;  Surgeon: Moise Anes, MD;  Location[facility];   Service: Urology;;   CYSTOSCOPY WITH BIOPSY N/A 11/08/2014   Procedure: CYSTOSCOPY/BIOPSPY BLADDER INSTILLATION OF MARCAINE  AND PYRIDIUM ;  Surgeon: Christina Coyer, MD;  Location: [facility];  Service: Urology;  Laterality: N/A;   CYSTOSCOPY WITH BIOPSY N/A 07/08/2015   Procedure: CYSTOSCOPY WITH BLADDER BIOPSY, FULGURATION, RETROGRADE PYLOGRAM AND RENAL WASHING;  Surgeon: Christina Coyer, MD;  Location: [facility];  Service: Urology;  Laterality: N/A;   CYSTOSCOPY WITH RETROGRADE PYELOGRAM, URETEROSCOPY AND STENT PLACEMENT Bilateral 07/30/2014   Procedure: CYSTOSCOPY WITH BLADDER BX FULERGATION AND BILATERAL RETROGRADE PYELOGRAM,;  Surgeon: Christina Coyer, MD;  Location: [facility];  Service: Urology;  Laterality: Bilateral;   CYSTOSCOPY WITH STENT PLACEMENT Left 03/24/2018   Procedure: STENT PLACEMENT AND BIOPSY;  Surgeon: Christina Coyer, MD;  Location: [facility];  Service: Urology;  Laterality: Left;   CYSTOSCOPY/RETROGRADE/URETEROSCOPY Bilateral 08/17/2013   Procedure: CYSTOSCOPY, BLADDER BIOPSY WITH BILATERAL RETROGRADE PYELOGRAM, LEFT URETEROSCOPY AND DILATION OF STRICTURE ;  Surgeon: Zoila Hines  Derrick Fling, MD;  Location: [facility];  Service: Urology;  Laterality: Bilateral;   CYSTOSCOPY/RETROGRADE/URETEROSCOPY Left 03/24/2018   Procedure: CYSTOSCOPY/RETROGRADE/URETEROSCOPY;  Surgeon: Christina Coyer, MD;  Location: [facility];  Service: Urology;  Laterality: Left;   HOT HEMOSTASIS N/A 01/16/2019   Procedure: HOT HEMOSTASIS (ARGON PLASMA COAGULATION/BICAP);  Surgeon: Ozell Blunt, MD;  Location: [facility];  Service: Endoscopy;  Laterality: N/A;   INSERTION OF MESH N/A 08/20/2020   Procedure: INSERTION OF MESH;  Surgeon: Shela Derby, MD;  Location: [facility];  Service: General;  Laterality: N/A;   LEFT ATRIAL APPENDAGE OCCLUSION N/A 01/09/2015   Procedure: LEFT ATRIAL APPENDAGE OCCLUSION;  Surgeon: Jolly Needle, MD;   [facility]: [facility] CV LAB;  Service: Cardiovascular;  Laterality: N/A;   LEFT HEART CATH AND CORONARY ANGIOGRAPHY N/A 11/20/2021   Procedure: LEFT HEART CATH AND CORONARY ANGIOGRAPHY;  Surgeon: Arnoldo Lapping, MD;  Location: [facility] INVASIVE CV LAB;  Service: Cardiovascular;  Laterality: N/A;   LUMBAR LAMINECTOMY/DECOMPRESSION MICRODISCECTOMY Left 12/03/2013   Procedure: Left Lumbar three-four diskectomy with Lumbar four laminectomy ;  Surgeon: Garry Kansas, MD;  Location: [facility];  Service: Neurosurgery;  Laterality: Left;  Left Lumbar three-four diskectomy with Lumbar four laminectomy    LYMPH NODE DISSECTION Bilateral 04/13/2019   Procedure: LYMPH NODE DISSECTION;  Surgeon: Osborn Blaze, MD;  Location: [facility];  Service: Urology;  Laterality: Bilateral;   ORIF LEFT ANKLE FX  2005   POLYPECTOMY  01/16/2019   Procedure: POLYPECTOMY;  Surgeon: Ozell Blunt, MD;  Location: [facility];  Service: Endoscopy;;   PROSTATE BIOPSY N/A 08/22/2012   Procedure: BIOPSY TRANSRECTAL ULTRASONIC PROSTATE (TUBP);  Surgeon: Moise Anes, MD;  Location: [facility];  Service: Urology;  Laterality: N/A;   ROBOT ASSITED LAPAROSCOPIC NEPHROURETERECTOMY Left 04/13/2019   Procedure: XI ROBOT ASSITED LAPAROSCOPIC NEPHROURETERECTOMY;  Surgeon: Osborn Blaze, MD;  Location: [facility];  Service: Urology;  Laterality: Left;  3 HRS   TEE WITHOUT CARDIOVERSION N/A 12/31/2014   Procedure: TRANSESOPHAGEAL ECHOCARDIOGRAM (TEE);  Surgeon: Hazle Lites, MD;  Location: [facility] ENDOSCOPY;  Service: Cardiovascular;  Laterality: N/A;  ef 55-60%/  mild MR, TR, and PR/  mild LAE and RAE   TEE WITHOUT CARDIOVERSION N/A 04/01/2022   Procedure: TRANSESOPHAGEAL ECHOCARDIOGRAM;  Surgeon: Efraim Grange, MD;  [facility]: [facility];  Service: Cardiovascular;  Laterality: N/A;   TRANSURETHRAL RESECTION OF BLADDER TUMOR  10/22/2011   Procedure: TRANSURETHRAL RESECTION OF BLADDER TUMOR (TURBT);  Surgeon: Moise Anes, MD;  Location: [facility];  Service: Urology;  Laterality: N/A;  TURBT, LEFT URETEROSCOPY, POSSIBLE URETERAL STENT    URETEROSCOPY  10/22/2011   Procedure: URETEROSCOPY;  Surgeon: Moise Anes, MD;  Location: [facility];  Service: Urology;  Laterality: Left;   WISDOM TOOTH EXTRACTION     XI ROBOTIC ASSISTED VENTRAL HERNIA N/A 08/20/2020   Procedure: ROBOTIC INCISIONAL HERNIA REPAIR WITH MESH;  Surgeon: Shela Derby, MD;  Location: [facility] OR;  Service: General;  Laterality: N/A;    Current Medications: No outpatient medications have been marked as taking for the 06/14/23 encounter (Appointment) with Terrance Ferretti, NP.     Allergies:   Bee venom, Lisinopril, Losartan, Oxycodone , and Prednisone    Social History   Socioeconomic History   Marital status: Married    Spouse name: Not on file   Number of children: 2   Years of education: Not on file   Highest education level: Bachelor's degree (e.g., BA,  AB, BS)  Occupational History   Occupation: Retired    Associate Professor: OTHER  Tobacco Use   Smoking status: Never   Smokeless tobacco: Never  Vaping Use   Vaping status: Never Used  Substance and Sexual Activity   Alcohol use: Yes    Alcohol/week: 7.0 standard drinks of alcohol    Types: 7 Glasses of wine per week    Comment: red wine glass daily or shot of whiskey   Drug use: No   Sexual activity: Yes  Other Topics Concern   Not on file  Social History Narrative   Not on file   Social Drivers of Health   Financial Resource Strain: Not on file  Food Insecurity: Low Risk  (07/12/2022)   [facility]   Hunger Vital Sign    Worried About Running Out of Food in the Last Year: Never true    Ran Out of Food in the Last Year: Never true  Transportation Needs: No Transportation Needs (07/12/2022)   [facility]   Transportation    In the past 12 months, has lack of  reliable transportation kept you from medical appointments, meetings, work or from getting things needed for daily living? : No  Physical Activity: Not on file  Stress: Not on file  Social Connections: Not on file     Family History: The patient's family history includes Cancer in his sister; Heart attack in his father; Leukemia in his mother. There is no history of Hypertension, Stroke, Colon cancer, Esophageal cancer, Rectal cancer, or Stomach cancer.  ROS:   Please see the history of present illness.     All other systems reviewed and are negative.  EKGs/Labs/Other Studies Reviewed:    The following studies were reviewed today:  04/01/2022 TEE -  Normal LV size with mild LV hypertrophy.  EF 55-60%.  Normal RV size and systolic function.  Mild left atrial enlargement.  Large, bilobed LA appendage with no thrombus.  Normal RA with Chiari network.  There was a PFO present with left to right flow.  Trivial TR.  Mild-moderate MR.  Calcified, trileaflet aortic valve with no stenosis, mild regurgitation.  Normal caliber thoracic aorta with mild plaque.   04/01/2022 atrial fibrillation ablation- CONCLUSIONS: 1. Successful PVI 2. Intracardiac echo reveals no effusion 3. No early apparent complications.  03/26/2022 CT cardiac morphology/pulmonary vein with calcium  score -  IMPRESSION: 1. There is normal pulmonary vein drainage into the left atrium.   2. The left atrial appendage is large with two lobes and ostial size 32 x 27 mm and length 31 mm. There is no thrombus in the left atrial appendage.   3. The esophagus runs in the left atrial midline and is not in the proximity to any of the pulmonary veins.   4. Coronary calcium  score 405.  Age-based comparison ends at age 56.  11/20/2021 left heart cath- 1.  Stable coronary artery disease with nonobstructive proximal and mid LAD plaquing, severe apical LAD stenosis, angiographically improved from the old cath study 2.  Patent left main,  left circumflex, and RCA with mild nonobstructive plaque noted 3.  Normal LVEDP   Recommendations: Medical therapy for CAD.  The patient's coronary anatomy is stable and in fact somewhat improved from his prior heart catheterization.  Okay to resume apixaban  tomorrow at his normal dosing schedule.      EKG:  EKG is  ordered today.  The ekg ordered today demonstrates SR, HR  67 bpm  Recent Labs: 02/23/2023: Hemoglobin 15.6; Platelets 273.0 06/02/2023: ALT 19; BUN 22; Creatinine, Ser 1.37; Potassium 4.3; Sodium 134  Recent Lipid Panel    Component Value Date/Time   CHOL 111 09/01/2021 0947   TRIG 90 09/01/2021 0947   HDL 38 (L) 09/01/2021 0947   CHOLHDL 2.9 09/01/2021 0947   CHOLHDL 4.4 12/12/2014 1721   VLDL 18 12/12/2014 1721   LDLCALC 55 09/01/2021 0947   LDLDIRECT 152 (H) 05/14/2021 1428     Risk Assessment/Calculations:    CHA2DS2-VASc Score =     This indicates a  % annual risk of stroke. The patient's score is based upon:     {This patient has a significant risk of stroke if diagnosed with atrial fibrillation.  Please consider VKA or DOAC agent for anticoagulation if the bleeding risk is acceptable.   You can also use the SmartPhrase .[email or web address] for documentation.   :[id number]}  No BP recorded.  {Refresh Note OR Click here to enter BP  :1}***         Physical Exam:    VS:  There were no vitals taken for this visit.    Wt Readings from Last 3 Encounters:  06/02/23 190 lb 12.8 oz (86.5 kg)  04/14/23 192 lb (87.1 kg)  03/15/23 188 lb 11.4 oz (85.6 kg)     GEN:  Well nourished, well developed in no acute distress HEENT: Normal NECK: No JVD; No carotid bruits LYMPHATICS: No lymphadenopathy CARDIAC: RRR, no murmurs, rubs, gallops RESPIRATORY:  Clear to auscultation without rales, wheezing or rhonchi  ABDOMEN: Soft, non-tender, non-distended MUSCULOSKELETAL:  No edema; No deformity  SKIN: Warm and dry NEUROLOGIC:  Alert and oriented x 3 PSYCHIATRIC:  Normal  affect   ASSESSMENT:    No diagnosis found.  PLAN:    In order of problems listed above:  PAF -s/p ablation on 04/01/2022.  He is in NSR today.  He denies recurrence of palpitations or atrial fibrillation.  He continues to feel well. CHA2DS2-VASc Score =   [ ] .  Therefore, the patient's annual risk of stroke is   %.  Continue reduced dose Eliquis  (4 out of the last 6 creatinine have been elevated greater than 1.5), continue metoprolol .  CAD - LHC in October 2023, showed stable LAD stenosis, nonobstructive proximal and mid LAD which was improved from previous cath. Stable with no anginal symptoms. No indication for ischemic evaluation.  Continue aspirin , metoprolol , rosuvastatin .  HLD -LDL on 09/01/2021 was well-controlled at 55, continue rosuvastatin .   Addendum to previous note:  Discussed with Dr. Arlester Ladd, Ideally, he shouldn't hold eliquis  for 90 days after the ablation. He absolutely cannot hold it for 30 days after the ablation. If it's relatively urgent, he could hold it for 48 hours after 60 days.     Medication Adjustments/Labs and Tests Ordered: Current medicines are reviewed at length with the patient today.  Concerns regarding medicines are outlined above.  No orders of the defined types were placed in this encounter.  No orders of the defined types were placed in this encounter.   There are no Patient Instructions on file for this visit.   Signed, Terrance Ferretti, NP  06/13/2023 9:53 AM    [facility]

## 2023-06-14 ENCOUNTER — Ambulatory Visit: Attending: Physician Assistant | Admitting: Physician Assistant

## 2023-06-14 ENCOUNTER — Encounter: Payer: Self-pay | Admitting: Physician Assistant

## 2023-06-14 ENCOUNTER — Ambulatory Visit: Admitting: Cardiology

## 2023-06-14 VITALS — BP 114/70 | HR 84 | Ht 67.0 in | Wt 192.4 lb

## 2023-06-14 DIAGNOSIS — R0602 Shortness of breath: Secondary | ICD-10-CM | POA: Diagnosis not present

## 2023-06-14 DIAGNOSIS — I493 Ventricular premature depolarization: Secondary | ICD-10-CM

## 2023-06-14 DIAGNOSIS — I480 Paroxysmal atrial fibrillation: Secondary | ICD-10-CM | POA: Diagnosis not present

## 2023-06-14 DIAGNOSIS — I2510 Atherosclerotic heart disease of native coronary artery without angina pectoris: Secondary | ICD-10-CM | POA: Diagnosis not present

## 2023-06-14 DIAGNOSIS — E785 Hyperlipidemia, unspecified: Secondary | ICD-10-CM

## 2023-06-14 DIAGNOSIS — R079 Chest pain, unspecified: Secondary | ICD-10-CM | POA: Diagnosis not present

## 2023-06-14 DIAGNOSIS — Z8673 Personal history of transient ischemic attack (TIA), and cerebral infarction without residual deficits: Secondary | ICD-10-CM

## 2023-06-14 DIAGNOSIS — I2583 Coronary atherosclerosis due to lipid rich plaque: Secondary | ICD-10-CM

## 2023-06-14 DIAGNOSIS — J849 Interstitial pulmonary disease, unspecified: Secondary | ICD-10-CM

## 2023-06-14 MED ORDER — METOPROLOL TARTRATE 25 MG PO TABS: 12.5000 mg | ORAL_TABLET | Freq: Two times a day (BID) | ORAL | 3 refills | Status: DC

## 2023-06-14 NOTE — Patient Instructions (Signed)
 Medication Instructions:  Your physician has recommended you make the following change in your medication:  DECREASE METOPROLOL  TO 12.5 MG (1/2 TAB) TWICE DAILY.  *If you need a refill on your cardiac medications before your next appointment, please call your pharmacy*  Lab Work: NONE If you have labs (blood work) drawn today and your tests are completely normal, you will receive your results only by: MyChart Message (if you have MyChart) OR A paper copy in the mail If you have any lab test that is abnormal or we need to change your treatment, we will call you to review the results.  Testing/Procedures: Your physician has requested that you have an echocardiogram. Echocardiography is a painless test that uses sound waves to create images of your heart. It provides your doctor with information about the size and shape of your heart and how well your heart's chambers and valves are working. This procedure takes approximately one hour. There are no restrictions for this procedure. Please do NOT wear cologne, perfume, aftershave, or lotions (deodorant is allowed). Please arrive 15 minutes prior to your appointment time.  Please note: We ask at that you not bring children with you during ultrasound (echo/ vascular) testing. Due to room size and safety concerns, children are not allowed in the ultrasound rooms during exams. Our front office staff cannot provide observation of children in our lobby area while testing is being conducted. An adult accompanying a patient to their appointment will only be allowed in the ultrasound room at the discretion of the ultrasound technician under special circumstances. We apologize for any inconvenience.   Follow-Up: At [facility], you and your health needs are our priority.  As part of our continuing mission to provide you with exceptional heart care, our providers are all part of one team.  This team includes your primary Cardiologist (physician) and  Advanced Practice Providers or APPs (Physician Assistants and Nurse Practitioners) who all work together to provide you with the care you need, when you need it.  Your next appointment:   KEEP SCHEDULED FOLLOW-UP  We recommend signing up for the patient portal called "MyChart".  Sign up information is provided on this After Visit Summary.  MyChart is used to connect with patients for Virtual Visits (Telemedicine).  Patients are able to view lab/test results, encounter notes, upcoming appointments, etc.  Non-urgent messages can be sent to your provider as well.   To learn more about what you can do with MyChart, go to [email or web address].   Other Instructions

## 2023-06-14 NOTE — Progress Notes (Signed)
 Cardiology Office Note:  .   Date:  06/14/2023  ID:  Charles Wright, DOB [birth year], MRN [id number] PCP: Charles Wright (Inactive)  [facility] Providers Cardiologist:  Charles Lites, MD Electrophysiologist:  Charles Grange, MD    History of Present Illness: .   Charles Wright is a 88 y.o. male  coronary artery disease with non-STEMI in 2016. He was found to have distal LAD disease not amenable to PCI as well as moderate mid to proximal LAD disease. Medical therapy was recommended.Repeat cath 11/2021 stable LAD stenosis and nonobstructive prox and mid LAD. Afib ablation 2024 and Aflutter requiring cardioversion. He also has a history of bladder cancer, prostate cancer, hyperlipidemia, hypertension and OSA on CPAP.  Patient saw Dr. Maximo Wright 04/14/23 with dizziness and SOB, was in NSR. Has pulmonary fibrosis from covid 19 and O2 sats 94-96%.   Patient called in "I am experiencing events where I am light headed, short of breath and feel totally deflated. When this happens, my O2 levels are in the 90's, blood pressure is normal, 110/60 and my EKG indicates no problems. I believe, I had a minor heart attack 2 weeks ago, while in [place] , short of breath, slight pain in chest and tingling of my left hand. I couldn't lay down as that made it worse. I was 50 miles from the nearest and pretty much alone, so I worked through it. I have varied my medication and find that when I take Metroprolol, it seems to kick it off. These events generally happen early afternoon or early morning, while I am still in bed."  Patient says ne didn't take his portable O2 with him to [place] -elevation was 4000 ft higher than here. He as going to bed and he felt left chest pressure, arm tingling and short of breath-couldn't lay down. Chest pain lasted 45-60 min, eased with 2 NTG. Has to sleep propped up for 2 nights. O2 sats and Kardia machine were fine. The next day he was helping push cattle down a shute and  walking-nothing strenuous and was SOB and week. Feels deflated and fatigue since back. No chest pain. Had some lightheadedness since here. Got back 05/30/23. He wonders if the higher dose metoprolol  is causing his fatigue.   ROS:    Studies Reviewed: Charles Wright    EKG Interpretation Date/Time:  Tuesday Jun 14 2023 12:03:35 EDT Ventricular Rate:  84 PR Interval:  186 QRS Duration:  94 QT Interval:  366 QTC Calculation: 432 R Axis:   38  Text Interpretation: Sinus rhythm with occasional Premature ventricular complexes When compared with ECG of 14-Apr-2023 10:13, Premature ventricular complexes are now Present ST changes similar on prior EKG's Confirmed by Charles Wright) on 06/14/2023 12:06:57 PM    Prior CV Studies:   Cardiac CT 03/2022 IMPRESSION: 1. There is normal pulmonary vein drainage into the left atrium.   2. The left atrial appendage is large with two lobes and ostial size 32 x 27 mm and length 31 mm. There is no thrombus in the left atrial appendage.   3. The esophagus runs in the left atrial midline and is not in the proximity to any of the pulmonary veins.   4. Coronary calcium  score 405.  Age-based comparison ends at age 48.   Charles Sos, MD   Electronically Signed: By: Charles Wright      Risk Assessment/Calculations:    CHA2DS2-VASc Score = 5   This indicates a  7.2% annual risk of stroke. The patient's score is based upon: CHF History: 1 HTN History: 1 Diabetes History: 0 Stroke History: 0 Vascular Disease History: 1 Age Score: 2 Gender Score: 0            Physical Exam:   VS:  BP 114/70 (BP Location: Left Arm, Patient Position: Sitting, Cuff Size: Normal)   Pulse 84   Ht 5\' 7"  (1.702 m)   Wt 192 lb 6.4 oz (87.3 kg)   SpO2 97%   BMI 30.13 kg/m    Wt Readings from Last 3 Encounters:  06/14/23 192 lb 6.4 oz (87.3 kg)  06/02/23 190 lb 12.8 oz (86.5 kg)  04/14/23 192 lb (87.1 kg)    GEN: Well nourished, well  developed in no acute distress NECK: No JVD; No carotid bruits CARDIAC:  RRR, no murmurs, rubs, gallops RESPIRATORY:  Clear to auscultation without rales, wheezing or rhonchi  ABDOMEN: Soft, non-tender, non-distended EXTREMITIES:  No edema; No deformity   ASSESSMENT AND PLAN: .    Chest pain/dizziness/fatigue while in [place]  at high altitude without his Oxygen . He's concerned he had an MI. No further chest pain but ongoing fatigue. Will order echo to assess LVEF, if down consider cath. He'd also like to decrease metoprolol  to see if this is causing his fatigue. We can try this but if elevated HR's he needs to go back up to 25 mg bid  CAD NSTEMI 2016 with multivessel coronary artery disease, including 99% LAD distal stenosis (not amenable to PCI) and 50 to 60% mid LAD stenosis of a tortuous vessel (12/2014), repeat cath 2023 medical therapy-see above-chest pain in [place]   PAF s/p ablation 2024, aflutter s/p DCCV  HLD last LDL 40   History of TIA/CVA  Frequent PVC's on BB  Pulmonary fibrosis Recently he was advised to start on Ofev  but concerned about side effects        Dispo: f/u with me in 2 weeks  Signed, Charles Flake, PA-C

## 2023-06-15 ENCOUNTER — Encounter: Payer: Self-pay | Admitting: Pulmonary Disease

## 2023-06-20 DIAGNOSIS — E039 Hypothyroidism, unspecified: Secondary | ICD-10-CM | POA: Diagnosis not present

## 2023-06-20 DIAGNOSIS — I129 Hypertensive chronic kidney disease with stage 1 through stage 4 chronic kidney disease, or unspecified chronic kidney disease: Secondary | ICD-10-CM | POA: Diagnosis not present

## 2023-06-20 DIAGNOSIS — I480 Paroxysmal atrial fibrillation: Secondary | ICD-10-CM | POA: Diagnosis not present

## 2023-06-20 DIAGNOSIS — E782 Mixed hyperlipidemia: Secondary | ICD-10-CM | POA: Diagnosis not present

## 2023-06-20 DIAGNOSIS — Z0000 Encounter for general adult medical examination without abnormal findings: Secondary | ICD-10-CM | POA: Diagnosis not present

## 2023-06-20 DIAGNOSIS — Z1331 Encounter for screening for depression: Secondary | ICD-10-CM | POA: Diagnosis not present

## 2023-06-20 DIAGNOSIS — I700 Atherosclerosis of aorta: Secondary | ICD-10-CM | POA: Diagnosis not present

## 2023-06-20 DIAGNOSIS — M109 Gout, unspecified: Secondary | ICD-10-CM | POA: Diagnosis not present

## 2023-06-28 NOTE — Progress Notes (Signed)
 " Cardiology Office Note:  .   Date:  06/28/2023  ID:  Charles Wright, DOB May 02, 1935, MRN [id number] PCP: Patrisha Jerrell Charles Wright (Inactive)  [facility] Providers Cardiologist:  Vinie JAYSON Maxcy, MD Electrophysiologist:  Eulas FORBES Furbish, MD    History of Present Illness: .   Charles Wright is a 88 y.o. male with history of  coronary artery disease with non-STEMI in 2016. He was found to have distal LAD disease not amenable to PCI as well as moderate mid to proximal LAD disease. Medical therapy was recommended.Repeat cath 11/2021 stable LAD stenosis and nonobstructive prox and mid LAD. Afib ablation 2024 and Aflutter requiring cardioversion. He also has a history of bladder cancer, prostate cancer, hyperlipidemia, hypertension and OSA on CPAP.   Patient saw Dr. Maxcy 04/14/23 with dizziness and SOB, was in NSR. Has pulmonary fibrosis from covid 19 and O2 sats 94-96%.     Patient called in I am experiencing events where I am light headed, short of breath and feel totally deflated. When this happens, my O2 levels are in the 90's, blood pressure is normal, 110/60 and my EKG indicates no problems. I believe, I had a minor heart attack 2 weeks ago, while in [place] , short of breath, slight pain in chest and tingling of my left hand. I couldn't lay down as that made it worse. I was 50 miles from the nearest and pretty much alone, so I worked through it. I have varied my medication and find that when I take Metroprolol, it seems to kick it off. These events generally happen early afternoon or early morning, while I am still in bed.    I saw patient 06/14/23 after he took a trip to [place]  without his O2. He had chest pain and SOB. I ordered an echo. He thought he was having the echo today so wants to wait to see me until echo is done 07/20/23.   ROS:    Studies Reviewed: SABRA         Prior CV Studies:    Cardiac CT 03/2022 IMPRESSION: 1. There is normal pulmonary vein drainage into the left  atrium.   2. The left atrial appendage is large with two lobes and ostial size 32 x 27 mm and length 31 mm. There is no thrombus in the left atrial appendage.   3. The esophagus runs in the left atrial midline and is not in the proximity to any of the pulmonary veins.   4. Coronary calcium  score 405.  Age-based comparison ends at age 26.   [name], MD   Electronically Signed: By: [name] M.D. On: 03/27/2022 15:04    Risk Assessment/Calculations:     Physical Exam:   VS:  There were no vitals taken for this visit.   Wt Readings from Last 3 Encounters:  06/14/23 192 lb 6.4 oz (87.3 kg)  06/02/23 190 lb 12.8 oz (86.5 kg)  04/14/23 192 lb (87.1 kg)    GEN: Well nourished, well developed in no acute distress NECK: No JVD; No carotid bruits CARDIAC:  RRR, no murmurs, rubs, gallops RESPIRATORY:  Clear to auscultation without rales, wheezing or rhonchi  ABDOMEN: Soft, non-tender, non-distended EXTREMITIES:  No edema; No deformity   ASSESSMENT AND PLAN: .    Chest pain/dizziness/fatigue while in [place]  at high altitude without his Oxygen . He's concerned he had an MI. No further chest pain but ongoing fatigue. Will order echo to assess LVEF, if down consider cath. He'd  also like to decrease metoprolol  to see if this is causing his fatigue. We can try this but if elevated HR's he needs to go back up to 25 mg bid   CAD NSTEMI 2016 with multivessel coronary artery disease, including 99% LAD distal stenosis (not amenable to PCI) and 50 to 60% mid LAD stenosis of a tortuous vessel (12/2014), repeat cath 2023 medical therapy-see above-chest pain in [place]    PAF s/p ablation 2024, aflutter s/p DCCV   HLD last LDL 40    History of TIA/CVA   Frequent PVC's on BB   Pulmonary fibrosis Recently he was advised to start on Ofev  but concerned about side effects         Signed, [name], PA-C   "

## 2023-06-30 DIAGNOSIS — K080 Exfoliation of teeth due to systemic causes: Secondary | ICD-10-CM | POA: Diagnosis not present

## 2023-07-01 DIAGNOSIS — R1011 Right upper quadrant pain: Secondary | ICD-10-CM | POA: Diagnosis not present

## 2023-07-01 DIAGNOSIS — K432 Incisional hernia without obstruction or gangrene: Secondary | ICD-10-CM | POA: Diagnosis not present

## 2023-07-05 ENCOUNTER — Other Ambulatory Visit: Payer: Self-pay | Admitting: General Surgery

## 2023-07-05 DIAGNOSIS — R1011 Right upper quadrant pain: Secondary | ICD-10-CM

## 2023-07-06 ENCOUNTER — Other Ambulatory Visit

## 2023-07-06 DIAGNOSIS — J849 Interstitial pulmonary disease, unspecified: Secondary | ICD-10-CM

## 2023-07-06 DIAGNOSIS — Z5181 Encounter for therapeutic drug level monitoring: Secondary | ICD-10-CM

## 2023-07-06 LAB — COMPREHENSIVE METABOLIC PANEL WITH GFR
ALT: 22 U/L (ref 0–53)
AST: 25 U/L (ref 0–37)
Albumin: 4 g/dL (ref 3.5–5.2)
Alkaline Phosphatase: 75 U/L (ref 39–117)
BUN: 29 mg/dL — ABNORMAL HIGH (ref 6–23)
CO2: 28 meq/L (ref 19–32)
Calcium: 9.2 mg/dL (ref 8.4–10.5)
Chloride: 104 meq/L (ref 96–112)
Creatinine, Ser: 1.23 mg/dL (ref 0.40–1.50)
GFR: 53.25 mL/min — ABNORMAL LOW (ref >60.00)
Glucose, Bld: 85 mg/dL (ref 70–99)
Potassium: 4.8 meq/L (ref 3.5–5.1)
Sodium: 137 meq/L (ref 135–145)
Total Bilirubin: 1.3 mg/dL — ABNORMAL HIGH (ref 0.2–1.2)
Total Protein: 6.3 g/dL (ref 6.0–8.3)

## 2023-07-08 ENCOUNTER — Ambulatory Visit: Payer: Self-pay | Admitting: Pharmacist

## 2023-07-08 DIAGNOSIS — J849 Interstitial pulmonary disease, unspecified: Secondary | ICD-10-CM

## 2023-07-08 MED ORDER — OFEV 150 MG PO CAPS
150.0000 mg | ORAL_CAPSULE | Freq: Two times a day (BID) | ORAL | 1 refills | Status: AC
Start: 2023-07-08 — End: ?

## 2023-07-11 ENCOUNTER — Ambulatory Visit (INDEPENDENT_AMBULATORY_CARE_PROVIDER_SITE_OTHER): Admitting: Physician Assistant

## 2023-07-11 ENCOUNTER — Encounter: Payer: Self-pay | Admitting: Physician Assistant

## 2023-07-11 VITALS — BP 128/66 | HR 81 | Ht 67.0 in | Wt 185.2 lb

## 2023-07-11 DIAGNOSIS — I480 Paroxysmal atrial fibrillation: Secondary | ICD-10-CM

## 2023-07-12 ENCOUNTER — Ambulatory Visit
Admission: RE | Admit: 2023-07-12 | Discharge: 2023-07-12 | Disposition: A | Discharge status: Home / Self Care | Source: Ambulatory Visit | Attending: General Surgery | Admitting: General Surgery

## 2023-07-12 DIAGNOSIS — R1011 Right upper quadrant pain: Secondary | ICD-10-CM

## 2023-07-12 DIAGNOSIS — J849 Interstitial pulmonary disease, unspecified: Secondary | ICD-10-CM | POA: Diagnosis not present

## 2023-07-15 DIAGNOSIS — Z45321 Encounter for adjustment and management of cochlear device: Secondary | ICD-10-CM | POA: Diagnosis not present

## 2023-07-15 DIAGNOSIS — Z9621 Cochlear implant status: Secondary | ICD-10-CM | POA: Diagnosis not present

## 2023-07-15 DIAGNOSIS — H9193 Unspecified hearing loss, bilateral: Secondary | ICD-10-CM | POA: Diagnosis not present

## 2023-07-15 DIAGNOSIS — H903 Sensorineural hearing loss, bilateral: Secondary | ICD-10-CM | POA: Diagnosis not present

## 2023-07-20 ENCOUNTER — Ambulatory Visit (HOSPITAL_COMMUNITY)
Admission: RE | Admit: 2023-07-20 | Discharge: 2023-07-20 | Disposition: A | Discharge status: Home / Self Care | Source: Ambulatory Visit | Attending: Cardiology | Admitting: Cardiology

## 2023-07-20 DIAGNOSIS — R079 Chest pain, unspecified: Secondary | ICD-10-CM | POA: Diagnosis not present

## 2023-07-20 LAB — ECHOCARDIOGRAM COMPLETE
Area-P 1/2: 3.43 cm2
P 1/2 time: 639 ms
S' Lateral: 2.7 cm

## 2023-07-20 NOTE — Progress Notes (Signed)
 Cardiology Office Note:  .   Date:  08/01/2023  ID:  Charles Wright, DOB December 10, 1935, MRN [id number] PCP: [name] (Inactive)  [facility] Providers Cardiologist:  Vinie JAYSON Maxcy, MD Electrophysiologist:  Eulas FORBES Furbish, MD    History of Present Illness: .   Charles Wright is a 88 y.o. male with history of  coronary artery disease with non-STEMI in 2016. He was found to have distal LAD disease not amenable to PCI as well as moderate mid to proximal LAD disease. Medical therapy was recommended.Repeat cath 11/2021 stable LAD stenosis and nonobstructive prox and mid LAD. Afib ablation 2024 and Aflutter requiring cardioversion. He also has a history of bladder cancer, prostate cancer, hyperlipidemia, hypertension and OSA on CPAP.   Patient saw Dr. Maxcy 04/14/23 with dizziness and SOB, was in NSR. Has pulmonary fibrosis from covid 19 and O2 sats 94-96%.     Patient called in I am experiencing events where I am light headed, short of breath and feel totally deflated. When this happens, my O2 levels are in the 90's, blood pressure is normal, 110/60 and my EKG indicates no problems. I believe, I had a minor heart attack 2 weeks ago, while in [place] , short of breath, slight pain in chest and tingling of my left hand. I couldn't lay down as that made it worse. I was 50 miles from the nearest and pretty much alone, so I worked through it. I have varied my medication and find that when I take Metroprolol, it seems to kick it off. These events generally happen early afternoon or early morning, while I am still in bed.    I saw patient 06/14/23 after he took a trip to [place]  without his O2. He had chest pain and SOB. I ordered an echo which showed normal heart function grade 1 DD, mod AI. Patient says his fatigue has improved on lower dose metoprolol  but he woke up in early am with fast HR and when he checked it his HR 105 but not afib. He took a whole metoprolol  25 mg instead of 1/2  tablet.Breathing has been good. No chest pain. Occasional dizziness when he bends over. He's staying hydrated. Had 7 teeth pulled and lost 8 lbs from not eating. Bp running 110/60.    ROS:    Studies Reviewed: SABRA         Prior CV Studies:   Echo 07/20/23 IMPRESSIONS     1. Left ventricular ejection fraction, by estimation, is 55 to 60%. The  left ventricle has normal function. The left ventricle has no regional  wall motion abnormalities. There is mild concentric left ventricular  hypertrophy. Left ventricular diastolic  parameters are consistent with Grade I diastolic dysfunction (impaired  relaxation).   2. Right ventricular systolic function is mildly reduced. The right  ventricular size is normal.   3. Left atrial size was mildly dilated.   4. The mitral valve is normal in structure. Trivial mitral valve  regurgitation. No evidence of mitral stenosis.   5. The aortic valve is normal in structure. There is mild calcification  of the aortic valve. Aortic valve regurgitation is moderate. Aortic valve  sclerosis/calcification is present, without any evidence of aortic  stenosis.   6. The inferior vena cava is normal in size with greater than 50%  respiratory variability, suggesting right atrial pressure of 3 mmHg.  Cardiac CT 03/2022 IMPRESSION: 1. There is normal pulmonary vein drainage into the left atrium.  2. The left atrial appendage is large with two lobes and ostial size 32 x 27 mm and length 31 mm. There is no thrombus in the left atrial appendage.   3. The esophagus runs in the left atrial midline and is not in the proximity to any of the pulmonary veins.   4. Coronary calcium  score 405.  Age-based comparison ends at age 55.   [name], MD   Electronically Signed: By: [name] M.D. On: 03/27/2022 15:04    Risk Assessment/Calculations:    CHA2DS2-VASc Score = 5   This indicates a 7.2% annual risk of stroke. The patient's score is based  upon: CHF History: 1 HTN History: 1 Diabetes History: 0 Stroke History: 0 Vascular Disease History: 1 Age Score: 2 Gender Score: 0            Physical Exam:   VS:  BP 106/60   Pulse 80   Ht 5' 7 (1.702 m)   Wt 180 lb (81.6 kg)   SpO2 99%   BMI 28.19 kg/m    Wt Readings from Last 3 Encounters:  08/01/23 180 lb (81.6 kg)  07/11/23 185 lb 3.2 oz (84 kg)  06/14/23 192 lb 6.4 oz (87.3 kg)    GEN: Well nourished, well developed in no acute distress NECK: No JVD; No carotid bruits CARDIAC:  RRR, no murmurs, rubs, gallops RESPIRATORY:  Clear to auscultation without rales, wheezing or rhonchi  ABDOMEN: Soft, non-tender, non-distended EXTREMITIES:  No edema; No deformity   ASSESSMENT AND PLAN: .    Chest pain/dizziness/fatigue while in [place]  at high altitude without his Oxygen . He's concerned he had an MI. No further chest pain and fatigue improved on lower dose metoprolol . Echo normal LVEF-see above for details. Symptoms likely from not having O2 at high altitude. No further symptoms.   CAD NSTEMI 2016 with multivessel coronary artery disease, including 99% LAD distal stenosis (not amenable to PCI) and 50 to 60% mid LAD stenosis of a tortuous vessel (12/2014), repeat cath 2023 medical therapy-see above-chest pain in [place]  without O2 but none since   PAF s/p ablation 2024, aflutter s/p DCCV-on metoprolol  and eliquis . One episode of tachycardia(not afib on Kardia machine) 105/m eased with extra metoprolol . BP runs low and he feels better on metoprolol  12.5 mg bid. Can take extra 12.5 mg for tacycardia.   HLD last LDL 40    History of TIA/CVA   Frequent PVC's on BB   Pulmonary fibrosis breathing improved on meds          Dispo: f/u in 6 months  Signed, [name], PA-C

## 2023-07-21 ENCOUNTER — Ambulatory Visit: Payer: Self-pay | Admitting: Physician Assistant

## 2023-07-21 DIAGNOSIS — I2510 Atherosclerotic heart disease of native coronary artery without angina pectoris: Secondary | ICD-10-CM | POA: Diagnosis not present

## 2023-07-21 DIAGNOSIS — N1832 Chronic kidney disease, stage 3b: Secondary | ICD-10-CM | POA: Diagnosis not present

## 2023-07-21 DIAGNOSIS — I480 Paroxysmal atrial fibrillation: Secondary | ICD-10-CM | POA: Diagnosis not present

## 2023-07-21 DIAGNOSIS — I10 Essential (primary) hypertension: Secondary | ICD-10-CM | POA: Diagnosis not present

## 2023-07-21 DIAGNOSIS — Z8679 Personal history of other diseases of the circulatory system: Secondary | ICD-10-CM | POA: Diagnosis not present

## 2023-07-21 DIAGNOSIS — J849 Interstitial pulmonary disease, unspecified: Secondary | ICD-10-CM | POA: Diagnosis not present

## 2023-08-01 ENCOUNTER — Encounter: Payer: Self-pay | Admitting: Physician Assistant

## 2023-08-01 ENCOUNTER — Ambulatory Visit: Admitting: Physician Assistant

## 2023-08-01 VITALS — BP 106/60 | HR 80 | Ht 67.0 in | Wt 180.0 lb

## 2023-08-01 DIAGNOSIS — I2510 Atherosclerotic heart disease of native coronary artery without angina pectoris: Secondary | ICD-10-CM | POA: Diagnosis not present

## 2023-08-01 DIAGNOSIS — E7849 Other hyperlipidemia: Secondary | ICD-10-CM | POA: Diagnosis not present

## 2023-08-01 DIAGNOSIS — Z8673 Personal history of transient ischemic attack (TIA), and cerebral infarction without residual deficits: Secondary | ICD-10-CM

## 2023-08-01 DIAGNOSIS — I480 Paroxysmal atrial fibrillation: Secondary | ICD-10-CM

## 2023-08-01 DIAGNOSIS — J849 Interstitial pulmonary disease, unspecified: Secondary | ICD-10-CM

## 2023-08-01 DIAGNOSIS — R079 Chest pain, unspecified: Secondary | ICD-10-CM | POA: Diagnosis not present

## 2023-08-01 DIAGNOSIS — I493 Ventricular premature depolarization: Secondary | ICD-10-CM

## 2023-08-01 DIAGNOSIS — I2583 Coronary atherosclerosis due to lipid rich plaque: Secondary | ICD-10-CM

## 2023-08-01 MED ORDER — METOPROLOL TARTRATE 25 MG PO TABS: 12.5000 mg | ORAL_TABLET | Freq: Two times a day (BID) | ORAL | 3 refills | Status: AC

## 2023-08-01 NOTE — Patient Instructions (Addendum)
 Medication Instructions:  Your physician has recommended you make the following change in your medication:  YOU MAY TAKE AN EXTRA 1/2 TABLET METOPROLOL  AS NEEDED FOR PALPATIONS  *If you need a refill on your cardiac medications before your next appointment, please call your pharmacy*  Lab Work: NONE If you have labs (blood work) drawn today and your tests are completely normal, you will receive your results only by: MyChart Message (if you have MyChart) OR A paper copy in the mail If you have any lab test that is abnormal or we need to change your treatment, we will call you to review the results.  Testing/Procedures: NONE  Follow-Up: At [facility], you and your health needs are our priority.  As part of our continuing mission to provide you with exceptional heart care, our providers are all part of one team.  This team includes your primary Cardiologist (physician) and Advanced Practice Providers or APPs (Physician Assistants and Nurse Practitioners) who all work together to provide you with the care you need, when you need it.  Your next appointment:   6 month(s)  Provider:   Vinie JAYSON Maxcy, MD   We recommend signing up for the patient portal called MyChart.  Sign up information is provided on this After Visit Summary.  MyChart is used to connect with patients for Virtual Visits (Telemedicine).  Patients are able to view lab/test results, encounter notes, upcoming appointments, etc.  Non-urgent messages can be sent to your provider as well.   To learn more about what you can do with MyChart, go to [email or web address].

## 2023-08-04 DIAGNOSIS — D6869 Other thrombophilia: Secondary | ICD-10-CM | POA: Diagnosis not present

## 2023-08-04 DIAGNOSIS — Z9849 Cataract extraction status, unspecified eye: Secondary | ICD-10-CM | POA: Diagnosis not present

## 2023-08-04 DIAGNOSIS — E785 Hyperlipidemia, unspecified: Secondary | ICD-10-CM | POA: Diagnosis not present

## 2023-08-09 DIAGNOSIS — K080 Exfoliation of teeth due to systemic causes: Secondary | ICD-10-CM | POA: Diagnosis not present

## 2023-08-11 ENCOUNTER — Other Ambulatory Visit: Payer: Self-pay | Admitting: *Deleted

## 2023-08-11 ENCOUNTER — Other Ambulatory Visit

## 2023-08-11 DIAGNOSIS — Z5181 Encounter for therapeutic drug level monitoring: Secondary | ICD-10-CM

## 2023-08-11 DIAGNOSIS — M25512 Pain in left shoulder: Secondary | ICD-10-CM | POA: Diagnosis not present

## 2023-08-11 DIAGNOSIS — J849 Interstitial pulmonary disease, unspecified: Secondary | ICD-10-CM | POA: Diagnosis not present

## 2023-08-11 DIAGNOSIS — R109 Unspecified abdominal pain: Secondary | ICD-10-CM | POA: Diagnosis not present

## 2023-08-12 LAB — COMPREHENSIVE METABOLIC PANEL WITH GFR
ALT: 25 IU/L (ref 0–44)
AST: 25 IU/L (ref 0–40)
Albumin: 4.1 g/dL (ref 3.7–4.7)
Alkaline Phosphatase: 104 IU/L (ref 44–121)
BUN/Creatinine Ratio: 15 (ref 10–24)
BUN: 23 mg/dL (ref 8–27)
Bilirubin Total: 1.7 mg/dL — ABNORMAL HIGH (ref 0.0–1.2)
CO2: 23 mmol/L (ref 20–29)
Calcium: 9.8 mg/dL (ref 8.6–10.2)
Chloride: 101 mmol/L (ref 96–106)
Creatinine, Ser: 1.49 mg/dL — ABNORMAL HIGH (ref 0.76–1.27)
Globulin, Total: 2 g/dL (ref 1.5–4.5)
Glucose: 88 mg/dL (ref 70–99)
Potassium: 4.6 mmol/L (ref 3.5–5.2)
Sodium: 138 mmol/L (ref 134–144)
Total Protein: 6.1 g/dL (ref 6.0–8.5)
eGFR: 45 mL/min/1.73 — ABNORMAL LOW (ref >59)

## 2023-08-15 ENCOUNTER — Other Ambulatory Visit: Payer: Self-pay | Admitting: Cardiovascular Disease

## 2023-08-15 NOTE — Telephone Encounter (Signed)
 Prescription refill request for Charles Wright  received. Indication: PAF Last office visit: 06/14/23  CHRISTELLA Pavy PA-C Charles Wright: 1.49 Charles Wright: 88 Charles Wright: 87.3kg  Based on above findings Charles Wright  5mg  twice daily is the appropriate dose.  Monitor Charles Wright closely.  Refill approved.

## 2023-08-18 ENCOUNTER — Ambulatory Visit: Payer: Self-pay | Admitting: Pulmonary Disease

## 2023-08-23 DIAGNOSIS — K080 Exfoliation of teeth due to systemic causes: Secondary | ICD-10-CM | POA: Diagnosis not present

## 2023-08-30 DIAGNOSIS — D225 Melanocytic nevi of trunk: Secondary | ICD-10-CM | POA: Diagnosis not present

## 2023-08-30 DIAGNOSIS — L718 Other rosacea: Secondary | ICD-10-CM | POA: Diagnosis not present

## 2023-08-30 DIAGNOSIS — L814 Other melanin hyperpigmentation: Secondary | ICD-10-CM | POA: Diagnosis not present

## 2023-08-30 DIAGNOSIS — L821 Other seborrheic keratosis: Secondary | ICD-10-CM | POA: Diagnosis not present

## 2023-08-31 NOTE — Addendum Note (Signed)
 Addended by: DAYNE SHERRY RAMAN on: 08/31/2023 09:47 AM   Modules accepted: Level of Service

## 2023-09-06 ENCOUNTER — Encounter: Payer: Self-pay | Admitting: Pulmonary Disease

## 2023-09-06 ENCOUNTER — Ambulatory Visit: Admitting: Pulmonary Disease

## 2023-09-06 VITALS — BP 112/72 | HR 82 | Ht 67.0 in | Wt 183.4 lb

## 2023-09-06 DIAGNOSIS — J849 Interstitial pulmonary disease, unspecified: Secondary | ICD-10-CM

## 2023-09-06 DIAGNOSIS — Z5181 Encounter for therapeutic drug level monitoring: Secondary | ICD-10-CM | POA: Diagnosis not present

## 2023-09-06 MED ORDER — BENZONATATE 200 MG PO CAPS: 200.0000 mg | ORAL_CAPSULE | Freq: Three times a day (TID) | ORAL | 1 refills | Status: AC | PRN

## 2023-09-06 NOTE — Progress Notes (Signed)
 Charles Wright    [id number]    04-Jan-1936  Primary Care Physician:Costella, Jerrell PARAS, PA-C (Inactive)  Referring Physician: No referring provider defined for this encounter.  Chief complaint:  Follow up for IPF On Ofev  since April 2025  HPI: 88 y.o.  with history of prostate, bladder, renal cancer, atrial fibrillation, coronary artery disease, sleep apnea Has complains of chronic cough, nonproductive in nature for the past 1 year.  Not associated with dyspnea or congestion He has been tried on Protonix  for empiric treatment of GERD with no improvement  He had a high-res CT recently which showed progressive probable UIP pattern pulmonary fibrosis and has been referred to clinic for further evaluation  Reports a viral illness in January 2020 after attending a conference in [place].  He was not tested for Covid at that time In 2022 we had referred him to rheumatology for elevated ANA.  Seen by Dr. Dolphus who felt he did not have any interstitial lung disease Also saw GI for acid reflux with no ongoing symptoms  Started Ofev  in April 2025 for diagnosis of IPF and is tolerating it well.  Interval history: Discussed the use of AI scribe software for clinical note transcription with the patient, who gave verbal consent to proceed.  History of Present Illness Charles Wright is an 88 year old male with pulmonary fibrosis who presents for follow-up regarding his condition and medication management.  Dyspnea and exercise tolerance - Pulmonary fibrosis diagnosed prior to April 2025 - Currently on nintedanib (Ofev ) since May 10, 2023 - Improved recovery time after physical exertion - Fatigue persists but recovers more quickly with supplemental oxygen  - Uses supplemental oxygen  primarily at night for approximately seven hours - Occasional daytime oxygen  use if oxygen  saturation drops - Remains active with outdoor work and exercise  Gastrointestinal side effects - Diarrhea  associated with nintedanib (Ofev ) use - Manages diarrhea with occasional use of Imodium - Diarrhea is described as manageable  Unintentional weight loss - Lost approximately ten pounds - Attributes weight loss to increased activity and limited diet following removal of back teeth  Hyperbilirubinemia - Idiopathic hyperbilirubinemia consistent with Gilbert's syndrome - Bilirubin levels occasionally up to 2.0 mg/dL - Condition remains stable  Cardiovascular status - Heart rate remains stable   Relevant pulmonary history Pets: Dogs, cats.  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  No asbestos, mold, hot tub, Jacuzzi or down pillows, comforters. ILD questionnaire 01/14/20-negative except as above. Smoking history: Occasionally smokes cigarettes and pipe in college Travel history: No significant travel Relevant family history: No significant family history of lung disease.  Outpatient Encounter Medications as of 09/06/2023  Medication Sig   allopurinol  (ZYLOPRIM ) 300 MG tablet Take 300 mg by mouth every morning.    apixaban  (ELIQUIS ) 5 MG TABS tablet TAKE 1 TABLET BY MOUTH 2 TIMES A DAY   Cholecalciferol (VITAMIN D-3) 125 MCG (5000 UT) TABS Take 5,000 Units by mouth daily.   diclofenac Sodium (VOLTAREN) 1 % GEL Apply 4 g topically as needed.   doxycycline (MONODOX) 50 MG capsule Take 50 mg by mouth daily.   EPINEPHrine  0.3 mg/0.3 mL IJ SOAJ injection Inject 0.3 mLs (0.3 mg total) into the muscle once.   GLUCOSAMINE SULFATE PO Take 750 mg by mouth daily.   ipratropium (ATROVENT) 0.03 % nasal spray Place 1 spray into  both nostrils 2 (two) times daily.   levothyroxine  (SYNTHROID , LEVOTHROID) 50 MCG tablet Take 50 mcg by mouth daily before breakfast.   metoprolol  tartrate (LOPRESSOR ) 25 MG tablet Take 0.5 tablets (12.5 mg total) by mouth 2 (two) times daily. YOU MAY TAKE  AN EXTRA 1/2 TABLET AS NEEDED FOR PALPATIONS   Nintedanib (OFEV ) 150 MG CAPS Take 1 capsule (150 mg total) by mouth 2 (two) times daily.   nitroGLYCERIN  (NITROSTAT ) 0.4 MG SL tablet Place 1 tablet (0.4 mg total) under the tongue every 5 (five) minutes x 3 doses as needed for chest pain.   rosuvastatin  (CRESTOR ) 20 MG tablet TAKE 1 TABLET BY MOUTH DAILY   Testosterone  1.62 % GEL Apply 2 Pump topically every other day. Applied to shoulder area   acetaminophen  (TYLENOL ) 500 MG tablet Take 1,000 mg by mouth every 6 (six) hours as needed for moderate pain or headache. (Patient not taking: Reported on 09/06/2023)   benzonatate  (TESSALON ) 200 MG capsule Take 1 capsule (200 mg total) by mouth 3 (three) times daily as needed for cough.   [DISCONTINUED] benzonatate  (TESSALON ) 200 MG capsule Take 1 capsule (200 mg total) by mouth 3 (three) times daily as needed for cough.   No facility-administered encounter medications on file as of 09/06/2023.    Physical Exam: Blood pressure 116/68, pulse 69, temperature (!) 97.5 F (36.4 C), temperature source Temporal, height 5' 7 (1.702 m), weight 190 lb 12.8 oz (86.5 kg), SpO2 96%. Gen:      No acute distress HEENT:  EOMI, sclera anicteric Neck:     No masses; no thyromegaly Lungs:    Clear to auscultation bilaterally; normal respiratory effort CV:         Regular rate and rhythm; no murmurs Abd:      + bowel sounds; soft, non-tender; no palpable masses, no distension Ext:    No edema; adequate peripheral perfusion Skin:      Warm and dry; no rash Neuro: alert and oriented x 3 Psych: normal mood and affect   Data Reviewed: Imaging: CT abdomen pelvis 03/18/2019 -mild interstitial changes at the base.  High-res CT 12/07/2019-left upper lobe pulmonary nodule measuring 5 mm, basilar predominant fibrosis and probable UIP pattern.  Progressive compared to prior scans.  High resolution CT 11/10/2022-progressive pulmonary fibrosis and probable UIP pattern.  4 mm  right middle lobe nodule I have reviewed the images personally.  PFTs: 06/18/2015 FVC 3.94 (6%), FEV1 3.11 (18%), F/F 79, TLC 6.46 [90%], DLCO 24.74 [85%] Normal test  03/07/2020 FVC 3.73 [105%], FEV1 2.94 (119%], F/F 79, TLC 5.38 [81%], DLCO 15.99 [71%] Mild diffusion defect  Labs:  ANA serologies 01/14/2020-positive for ANA 1 is to 80, p-ANCA 1 is to 80 Assessment & Plan Idiopathic pulmonary fibrosis with oxygen  dependence Review of CT scan shows probable UIP pattern fibrosis.  He has minimal changes at the base dating back to at least 2012 on CTs of the abdomen.  It is more clearly progressive since his last CT abdomen in 2020.  This is suggestive of IPF in the absence of any exposures or CTD symptoms  CTD serologies reviewed with mild elevation in ANA, ANCA.  This may be nonspecific Rheumatology referral completed with no evidence of autoimmune process  He reports improved recovery time post-exertion and primarily uses oxygen  at night. Experiences manageable diarrhea from Ofev , controlled with occasional Imodium. Weight loss attributed to dental issues and limited diet. Crackles on auscultation, consistent with pulmonary fibrosis. Optimistic about treatment, which aims to  slow disease progression. Ofev  is not expected to cure or reverse the condition but can significantly extend survival beyond the median of three to five years without treatment.  He finished pulmonary rehab in early 2025 - Continue Ofev  (nintedanib).  Labs earlier this month showed elevation in bilirubin which is chronic due to Gilbert's syndrome - Use oxygen  primarily at night and as needed during the day - Order CT scan in three months - Schedule lung function test in six months - Continue monthly liver function tests for two more months, then reduce to every three months  Gilbert's syndrome Gilbert's syndrome with well-managed bilirubin levels. The condition does not impact the management of idiopathic pulmonary  fibrosis. - Continue monitoring bilirubin levels as part of routine liver function tests  Lung Nodule Small lung nodule identified on CT scan Follow-up on repeat CT  Plan/Recommendations: Continue Ofev  Labs for monitoring Follow-up CT, PFTs  Lonna Coder MD [facility] 09/06/2023, 4:15 PM  CC: No ref. provider found

## 2023-09-06 NOTE — Patient Instructions (Signed)
 VISIT SUMMARY:  Today, you had a follow-up appointment to discuss your pulmonary fibrosis and medication management. We reviewed your current symptoms, medication side effects, and overall health status.  YOUR PLAN:  -IDIOPATHIC PULMONARY FIBROSIS WITH OXYGEN  DEPENDENCE: Idiopathic pulmonary fibrosis is a lung disease that causes scarring of the lungs, leading to breathing difficulties. You are managing this condition with Ofev  (nintedanib), which helps slow the disease's progression. You reported improved recovery time after physical exertion and primarily use oxygen  at night. Continue taking Ofev  and using oxygen  as needed. We will schedule a CT scan in three months and a lung function test in six months. Monthly liver function tests will continue for two more months, then reduce to every three months.  -GILBERT'S SYNDROME: Gilbert's syndrome is a mild liver disorder that causes occasional increases in bilirubin levels. Your bilirubin levels are stable and do not affect the management of your pulmonary fibrosis. We will continue to monitor your bilirubin levels as part of your routine liver function tests.  INSTRUCTIONS:  Please continue taking Ofev  (nintedanib) as prescribed and use supplemental oxygen  primarily at night and as needed during the day. We will schedule a CT scan in three months and a lung function test in six months. Continue with monthly liver function tests for two more months, then reduce to every three months. If you have any new symptoms or concerns, please contact our office.

## 2023-09-11 DIAGNOSIS — J849 Interstitial pulmonary disease, unspecified: Secondary | ICD-10-CM | POA: Diagnosis not present

## 2023-09-14 DIAGNOSIS — K080 Exfoliation of teeth due to systemic causes: Secondary | ICD-10-CM | POA: Diagnosis not present

## 2023-09-26 DIAGNOSIS — H524 Presbyopia: Secondary | ICD-10-CM | POA: Diagnosis not present

## 2023-10-12 ENCOUNTER — Other Ambulatory Visit: Payer: Self-pay

## 2023-10-12 ENCOUNTER — Ambulatory Visit: Attending: Physician Assistant

## 2023-10-12 DIAGNOSIS — R293 Abnormal posture: Secondary | ICD-10-CM | POA: Insufficient documentation

## 2023-10-12 DIAGNOSIS — M6281 Muscle weakness (generalized): Secondary | ICD-10-CM | POA: Diagnosis not present

## 2023-10-12 DIAGNOSIS — R279 Unspecified lack of coordination: Secondary | ICD-10-CM | POA: Insufficient documentation

## 2023-10-12 DIAGNOSIS — M5459 Other low back pain: Secondary | ICD-10-CM | POA: Insufficient documentation

## 2023-10-12 DIAGNOSIS — R1010 Upper abdominal pain, unspecified: Secondary | ICD-10-CM | POA: Diagnosis not present

## 2023-10-12 DIAGNOSIS — J849 Interstitial pulmonary disease, unspecified: Secondary | ICD-10-CM | POA: Diagnosis not present

## 2023-10-12 DIAGNOSIS — M62838 Other muscle spasm: Secondary | ICD-10-CM | POA: Insufficient documentation

## 2023-10-12 NOTE — Therapy (Signed)
 OUTPATIENT PHYSICAL THERAPY MALE PELVIC EVALUATION   Patient Name: Charles Wright MRN: [id number] DOB:1935-08-12, 88 y.o., male Today's Date: 10/12/2023  END OF SESSION:  PT End of Session - 10/12/23 1145     Visit Number 1    Date for PT Re-Evaluation 01/04/24    Authorization Type BCBS Medicare    Progress Note Due on Visit 10    PT Start Time 1146    PT Stop Time 1222    PT Time Calculation (min) 36 min    Activity Tolerance Patient tolerated treatment well    Behavior During Therapy WFL for tasks assessed/performed          Past Medical History:  Diagnosis Date   Acute gout of left ankle    06-14-2015   Bladder cancer (HCC)    BCG's tx's   BPH (benign prostatic hypertrophy)    CAD (coronary artery disease) CARDIOLOGIST-  DR TILLEY   a. NSTEMI 12/2014 -  99% dLAD-2 (not amenable to PCI), 50% dLAD-1, 60% mLAD. Medical therapy was recommended.   Cancer of kidney (HCC)    left   Cough    HARSH NONPRODUCTIVE COUGH 1 AND 1/2 WEEKS   GERD (gastroesophageal reflux disease)    takes  OTC periodically   Gilbert's syndrome    H/O cardiac catheterization    a. Note: Difficult radial access in 12/2014 - recommend femoral approach if cath needed in the future.   History of kidney stones    History of non-ST elevation myocardial infarction (NSTEMI)    12-12-2014   CARDIAC CATH W/ NO INTERVENTION X 2FEW DAYS APART   History of spinal fracture 2002   LUMBAR   Hyperlipidemia    Hypertension    Hypothyroidism    Myocardial infarction (HCC)    OSA on CPAP    SET ON 7 USES SOME NIGHTS   Paroxysmal A-fib [facility])    cardiologist-  dr blanca   Prostate cancer [facility]) 2019   last PSA  2.9  (montiored by urologist  dr nieves) [place] AND FEB 2019 RAD DONE   Shingles    2 weeks ago   Wears glasses    Past Surgical History:  Procedure Laterality Date   ATRIAL FIBRILLATION ABLATION N/A 04/01/2022   Procedure: ATRIAL FIBRILLATION ABLATION;  Surgeon: Nancey Eulas BRAVO, MD;  Location: [facility]  INVASIVE CV LAB;  Service: Cardiovascular;  Laterality: N/A;   BACK SURGERY  2014   LOWER l 1, 2 4 AND 5   BILATERAL INGUINAL HERNIA REPAIR     CARDIAC CATHETERIZATION N/A 12/13/2014   Procedure: Left Heart Cath and Coronary Angiography;  Surgeon: Alm LELON Clay, MD;  Location: [facility] INVASIVE CV LAB;  Service: Cardiovascular;  Laterality: N/A;  culprit lesion diat LAD-2  99%, not approachable via PCTA  due to extremely tortuous up stream LAD/  mLAD 60% and dLAD-1 50%, both are at the extremely tortuous segment and not PCI targets/  otherwise normal coronary arteries   CARDIOVERSION N/A 08/09/2022   Procedure: CARDIOVERSION;  Surgeon: Okey Vina GAILS, MD;  Location: [facility] INVASIVE CV LAB;  Service: Cardiovascular;  Laterality: N/A;   COLONOSCOPY WITH PROPOFOL  N/A 01/16/2019   Procedure: COLONOSCOPY WITH PROPOFOL ;  Surgeon: Rosalie Kitchens, MD;  Location: [facility]OS[facility];  Service: Endoscopy;  Laterality: N/A;   CYSTOSCOPY W/ RETROGRADES Left 08/22/2012   Procedure: CYSTOSCOPY WITH RETROGRADE PYELOGRAM;  left kidney washings;  Surgeon: Donnice Gwenyth nieves, MD;  Location: [facility];  Service: Urology;  Laterality: Left;  CYSTOSCOPY W/ RETROGRADES N/A 07/08/2015   Procedure: CYSTOSCOPY WITH RETROGRADE PYELOGRAM;  Surgeon: Donnice Brooks, MD;  Location: [facility];  Service: Urology;  Laterality: N/A;   CYSTOSCOPY W/ URETERAL STENT PLACEMENT Left 03/24/2019   Procedure: CYSTOSCOPY WITH RETROGRADE PYELOGRAM/URETERAL STENT PLACEMENT ureteroscopy biopsy of neoplasm;  Surgeon: Brooks Donnice, MD;  Location: [facility];  Service: Urology;  Laterality: Left;   CYSTOSCOPY W/ URETERAL STENT PLACEMENT Left 04/08/2019   Procedure: CYSTOSCOPY cystogram clot evacuation left ureteroscopy clot evacuation left stent exchange liimited transuretheral resectio of prastate and fulguration  bleeders of prostate;  Surgeon: Brooks Donnice, MD;  Location: [facility];  Service: Urology;  Laterality: Left;    CYSTOSCOPY WITH BIOPSY  08/22/2012   Procedure: CYSTOSCOPY WITH BIOPSY;  Surgeon: Donnice Gwenyth Brooks, MD;  Location: [facility];  Service: Urology;;   CYSTOSCOPY WITH BIOPSY N/A 11/08/2014   Procedure: CYSTOSCOPY/BIOPSPY BLADDER INSTILLATION OF MARCAINE  AND PYRIDIUM ;  Surgeon: Donnice Brooks, MD;  Location: [facility];  Service: Urology;  Laterality: N/A;   CYSTOSCOPY WITH BIOPSY N/A 07/08/2015   Procedure: CYSTOSCOPY WITH BLADDER BIOPSY, FULGURATION, RETROGRADE PYLOGRAM AND RENAL WASHING;  Surgeon: Donnice Brooks, MD;  Location: [facility];  Service: Urology;  Laterality: N/A;   CYSTOSCOPY WITH RETROGRADE PYELOGRAM, URETEROSCOPY AND STENT PLACEMENT Bilateral 07/30/2014   Procedure: CYSTOSCOPY WITH BLADDER BX FULERGATION AND BILATERAL RETROGRADE PYELOGRAM,;  Surgeon: Donnice Brooks, MD;  Location: [facility];  Service: Urology;  Laterality: Bilateral;   CYSTOSCOPY WITH STENT PLACEMENT Left 03/24/2018   Procedure: STENT PLACEMENT AND BIOPSY;  Surgeon: Brooks Donnice, MD;  Location: [facility];  Service: Urology;  Laterality: Left;   CYSTOSCOPY/RETROGRADE/URETEROSCOPY Bilateral 08/17/2013   Procedure: CYSTOSCOPY, BLADDER BIOPSY WITH BILATERAL RETROGRADE PYELOGRAM, LEFT URETEROSCOPY AND DILATION OF STRICTURE ;  Surgeon: Donnice Brooks, MD;  Location: [facility];  Service: Urology;  Laterality: Bilateral;   CYSTOSCOPY/RETROGRADE/URETEROSCOPY Left 03/24/2018   Procedure: CYSTOSCOPY/RETROGRADE/URETEROSCOPY;  Surgeon: Brooks Donnice, MD;  Location: [facility];  Service: Urology;  Laterality: Left;   HOT HEMOSTASIS N/A 01/16/2019   Procedure: HOT HEMOSTASIS (ARGON PLASMA COAGULATION/BICAP);  Surgeon: Rosalie Kitchens, MD;  Location: [facility];  Service: Endoscopy;  Laterality: N/A;   INSERTION OF MESH N/A 08/20/2020   Procedure: INSERTION OF MESH;  Surgeon: Rubin Calamity, MD;  Location: [facility] OR;  Service:  General;  Laterality: N/A;   LEFT ATRIAL APPENDAGE OCCLUSION N/A 01/09/2015   Procedure: LEFT ATRIAL APPENDAGE OCCLUSION;  Surgeon: Lynwood Rakers, MD;  [facility]: [facility] INVASIVE CV LAB;  Service: Cardiovascular;  Laterality: N/A;   LEFT HEART CATH AND CORONARY ANGIOGRAPHY N/A 11/20/2021   Procedure: LEFT HEART CATH AND CORONARY ANGIOGRAPHY;  Surgeon: Wonda Sharper, MD;  Location: [facility] INVASIVE CV LAB;  Service: Cardiovascular;  Laterality: N/A;   LUMBAR LAMINECTOMY/DECOMPRESSION MICRODISCECTOMY Left 12/03/2013   Procedure: Left Lumbar three-four diskectomy with Lumbar four laminectomy ;  Surgeon: Reyes Budge, MD;  Location: [facility];  Service: Neurosurgery;  Laterality: Left;  Left Lumbar three-four diskectomy with Lumbar four laminectomy    LYMPH NODE DISSECTION Bilateral 04/13/2019   Procedure: LYMPH NODE DISSECTION;  Surgeon: Alvaro Hummer, MD;  Location: [facility];  Service: Urology;  Laterality: Bilateral;   ORIF LEFT ANKLE FX  2005   POLYPECTOMY  01/16/2019   Procedure: POLYPECTOMY;  Surgeon: Rosalie Kitchens, MD;  Location: [facility];  Service: Endoscopy;;   PROSTATE BIOPSY N/A 08/22/2012   Procedure: BIOPSY TRANSRECTAL ULTRASONIC PROSTATE (TUBP);  Surgeon: Donnice Gwenyth Brooks, MD;  Location: [facility];  Service: Urology;  Laterality: N/A;   ROBOT ASSITED LAPAROSCOPIC NEPHROURETERECTOMY Left 04/13/2019   Procedure: XI ROBOT ASSITED LAPAROSCOPIC NEPHROURETERECTOMY;  Surgeon: Alvaro Hummer, MD;  Location: [facility];  Service: Urology;  Laterality: Left;  3 HRS   TEE WITHOUT CARDIOVERSION N/A 12/31/2014   Procedure: TRANSESOPHAGEAL ECHOCARDIOGRAM (TEE);  Surgeon: Vinie JAYSON Maxcy, MD;  Location: [facility];  Service: Cardiovascular;  Laterality: N/A;  ef 55-60%/  mild MR, TR, and PR/  mild LAE and RAE   TEE WITHOUT CARDIOVERSION N/A 04/01/2022   Procedure: TRANSESOPHAGEAL ECHOCARDIOGRAM;  Surgeon: Nancey Eulas BRAVO, MD;  Location: [facility] INVASIVE CV LAB;  Service: Cardiovascular;   Laterality: N/A;   TRANSURETHRAL RESECTION OF BLADDER TUMOR  10/22/2011   Procedure: TRANSURETHRAL RESECTION OF BLADDER TUMOR (TURBT);  Surgeon: Donnice Gwenyth Brooks, MD;  Location: [facility];  Service: Urology;  Laterality: N/A;  TURBT, LEFT URETEROSCOPY, POSSIBLE URETERAL STENT    URETEROSCOPY  10/22/2011   Procedure: URETEROSCOPY;  Surgeon: Donnice Gwenyth Brooks, MD;  [facility]  Service: Urology;  Laterality: Left;   WISDOM TOOTH EXTRACTION     XI ROBOTIC ASSISTED VENTRAL HERNIA N/A 08/20/2020   Procedure: ROBOTIC INCISIONAL HERNIA REPAIR WITH MESH;  Surgeon: Rubin Calamity, MD;  Location: [facility] OR;  Service: General;  Laterality: N/A;   Patient Active Problem List   Diagnosis Date Noted   Hypercoagulable state due to paroxysmal atrial fibrillation (HCC) 04/29/2022   S/P hernia repair 08/20/2020   Renal mass 04/13/2019   Gross hematuria 04/08/2019   Hyperlipidemia 12/14/2014   CAD (coronary artery disease), native coronary artery    Paroxysmal atrial fibrillation (HCC) 08/13/2014   Long term current use of anticoagulant therapy 08/13/2014   Obesity (BMI 30-39.9) 08/13/2014   Hypertensive heart disease    Sleep apnea    Bladder cancer (HCC)    Prostate cancer (HCC)    Lumbar stenosis with neurogenic claudication 12/03/2013   Upper airway cough syndrome 01/27/2013    PCP: Patrisha Jerrell PARAS, PA-C  REFERRING PROVIDER: Wonda Worth SQUIBB, PA   REFERRING DIAG: R10.9 (ICD-10-CM) - Unspecified abdominal pain  THERAPY DIAG:  Pain of upper abdomen  Other low back pain  Muscle weakness (generalized)  Other muscle spasm  Unspecified lack of coordination  Abnormal posture  Rationale for Evaluation and Treatment: Rehabilitation  ONSET DATE: 2021  SUBJECTIVE:                                                                                                                                                                                            SUBJECTIVE STATEMENT:  He is also having abdominal pain. He had kidney removed in 2021 with abdominal hernia repair using mesh in upper abdomen. He had CT scan to check this and everything was fine.   Pt states that he is having a lot of Lt sided shoulder pain. He has had lost 15 lbs since June without trying due to lack of appetite after having teeth pulled.    PAIN:  Are you having pain? Yes NPRS scale: 7/10 at worst, Rt side worse than Lt Pain location: bil upper abdomen under rib cage  Pain type: twisting and sharp Pain description: intermittent   Aggravating factors: reaching, pulling, lifting, bending Relieving factors: compression over abdomen with painful activities   PRECAUTIONS: Other: history of cancer  RED FLAGS: None   WEIGHT BEARING RESTRICTIONS: No  FALLS:  Has patient fallen in last 6 months? No  OCCUPATION: retired  ACTIVITY LEVEL : walking, stays busy outside   PLOF: Independent  PATIENT GOALS: decrease abdominal pain   PERTINENT HISTORY:  Bladder cancer, BPH, GERD, prostate cancer, a-fib, bil inguinal hernia repair, lumbar laminectomy/decompression 2015, pulmonary fibrosis Sexual abuse: No  BOWEL MOVEMENT: *Pt states that he normally does not have issues, but due to medication for pulmonary fibrosis he has diarrhea, but feels like he can control  URINATION: WNL  INTERCOURSE:  WNL   OBJECTIVE:  Note: Objective measures were completed at Evaluation unless otherwise noted.  10/12/23 PATIENT SURVEYS:   PFIQ-7: 48  COGNITION: Overall cognitive status: Within functional limits for tasks assessed     SENSATION: Light touch: Appears intact   FUNCTIONAL TESTS:  Squat: bil valgus knee collapse, Rt>Lt Single leg stance:  Rt: pelvic drop  Lt:  pelvic drop  Curl-up test: upper abdominal distortion   GAIT: Assistive device utilized: None Comments: some antalgic Lt steps after initial rise from chair, Lt trendelenburg    POSTURE: rounded shoulders, forward head, decreased lumbar lordosis, increased thoracic kyphosis, and posterior pelvic tilt   LUMBARAROM/PROM:  A/PROM A/PROM  Eval (% available)  Flexion 50  Extension 25, relief  Right lateral flexion 75  Left lateral flexion 75  Right rotation 75  Left rotation 75   (Blank rows = not tested)  PALPATION:   General: lumbar scar tissue restriction; tightness in bil lumbar paraspinals and trigger points in Lt glutes  Pelvic Alignment: posterior pelvic tilt  Abdominal: midline abdominal scar tissue restriction; significant tightness throughout all 4 abdominal quadrants with pain directly inferior to bil rib cage; significant increase in sternocostal angle with decreased abdominal tone in upper abdominals resulting in doming  Diastasis recti abdominals: 2-3 finger widths through midline           TODAY'S TREATMENT:                                                                                                                              DATE:  10/12/23 EVAL  Exercises: Supine lower trunk rotation 10x Open books 5x  bil Modified thomas stretch 60 sec bil Seated piriformis stretch 60 sec bil Seated forward fold 60 seconds   PATIENT EDUCATION:  Education details: See above Person educated: Patient Education method: Explanation, Demonstration, Tactile cues, Verbal cues, and Handouts Education comprehension: verbalized understanding  HOME EXERCISE PROGRAM: [id number]  ASSESSMENT:  CLINICAL IMPRESSION: Patient is a 88 y.o. male who was seen today for physical therapy evaluation and treatment for upper abdominal pain since 2021. Exam findings notable for abnormal posture and gait, decreased ability to perform single leg stance bil and pelvic drop, decreased lumbar A/ROM, core weakness with diastasis recti abdominus and abdominal distortion, abdominal and lumbar scar tissue restriction, significant tightness throughout lower abdomen, decreased  abdominal tone in upper abdominals, tenderness to palpation in upper abdominals, large increase in sternocostal angle, and muscle spasm in low back and Lt hip. Signs and symptoms are most consistent with abdominal scar tissue restriction, core weakness (worse in upper abdominals causing lower abdomen bracing), and abnormal rib cage posture. Initial treatment consisted of mobility exercises to help improve scar tissue restriction and mobility throughout pelvis/core. He will continue to benefit from skilled PT intervention in order to decrease pain, address impairments, and improve quality of life.  Pt was educated that he should talk to primary care provider about getting referral for bil shoulder pain and we cannot address without; pt reports understanding.   OBJECTIVE IMPAIRMENTS: decreased activity tolerance, decreased coordination, decreased endurance, decreased mobility, decreased ROM, decreased strength, increased fascial restrictions, increased muscle spasms, impaired flexibility, impaired tone, improper body mechanics, postural dysfunction, and pain.   ACTIVITY LIMITATIONS: carrying, lifting, bending, squatting, and transfers  PARTICIPATION LIMITATIONS: cleaning, laundry, community activity, and yard work  PERSONAL FACTORS: 3+ comorbidities: medical history are also affecting patient's functional outcome.   REHAB POTENTIAL: Good  CLINICAL DECISION MAKING: Evolving/moderate complexity  EVALUATION COMPLEXITY: Moderate   GOALS: Goals reviewed with patient? Yes  SHORT TERM GOALS: Target date: 11/09/2023   Pt will be independent with HEP in order to improve activity tolerance.   Baseline: only walking Goal status: INITIAL  2.  Patient will report 25% improve in abdominal pain in order to increase activity tolerance.   Baseline: 7/10 Goal status: INITIAL  3.  Pt will improve upper abdominal tone in order to decrease sternocostal angle, improve activity tolerance, and decrease pain.    Baseline:  Goal status: INITIAL  4.  Pt will increase all impaired lumbar A/ROM by 25% without pain in order to improve abdominal pain.   Baseline: most decrease in flexion and extension Goal status: INITIAL    LONG TERM GOALS: Target date: 01/04/2024   Pt will be independent with advanced HEP in order to improve activity tolerance.   Baseline:  Goal status: INITIAL  2.  Patient will report 75% improve in abdominal pain in order to increase activity tolerance.   Baseline: 7/10 Goal status: INITIAL  3.  Patient will be able to perform single leg stance with good pelvic stability in order to demonstrate appropriate pelvic floor muscle and transversus abdominus strength and coordination in order to decrease abdominal pain and reduce risk of falls.   Baseline: pelvic drop bil Goal status: INITIAL  4.  Pt will demonstrate appropriate squat form in order to show improved bil functional hip strength and reduce risk of falls and improve pelvic support to decrease pain.  Baseline: bil valgus knee collapse, Rt>Lt Goal status: INITIAL  5.  Pt will decrease PFIQ-7 score to less than 20 in order to demonstrate  improved functional ability and activity tolerance.  Baseline: 48 Goal status: INITIAL   PLAN:  PT FREQUENCY: 1-2x/week  PT DURATION: 10 visits    PLANNED INTERVENTIONS: [id number]- PT Re-evaluation, 97110-Therapeutic exercises, 97530- Therapeutic activity, 97112- Neuromuscular re-education, 97535- Self Care, [id number]- Manual therapy, [phone]- Gait training, [phone]- Aquatic Therapy, [phone]- Electrical stimulation (unattended), [phone]- Traction (mechanical), [id number]- Ionotophoresis 4mg /ml Dexamethasone , [id number] (1-2 muscles), [phone] (3+ muscles)- Dry Needling, Patient/Family education, Balance training, Taping, Joint mobilization, Joint manipulation, Spinal manipulation, Spinal mobilization, Scar mobilization, Vestibular training, Cryotherapy, Moist heat, and Biofeedback  PLAN FOR NEXT  SESSION: manual techniques to lower and upper abdomen to help improve scar tissue restriction; mobility activities; core and hip strengthening  Josette Mares, PT, DPT09/03/252:00 PM

## 2023-10-20 ENCOUNTER — Ambulatory Visit: Admitting: Physical Therapy

## 2023-10-20 DIAGNOSIS — R279 Unspecified lack of coordination: Secondary | ICD-10-CM | POA: Diagnosis not present

## 2023-10-20 DIAGNOSIS — M6281 Muscle weakness (generalized): Secondary | ICD-10-CM | POA: Diagnosis not present

## 2023-10-20 DIAGNOSIS — R293 Abnormal posture: Secondary | ICD-10-CM | POA: Diagnosis not present

## 2023-10-20 DIAGNOSIS — M62838 Other muscle spasm: Secondary | ICD-10-CM | POA: Diagnosis not present

## 2023-10-20 DIAGNOSIS — M5459 Other low back pain: Secondary | ICD-10-CM | POA: Diagnosis not present

## 2023-10-20 DIAGNOSIS — R1010 Upper abdominal pain, unspecified: Secondary | ICD-10-CM

## 2023-10-20 NOTE — Therapy (Signed)
 OUTPATIENT PHYSICAL THERAPY MALE PELVIC EVALUATION   Patient Name: Charles Wright MRN: [id number] DOB:04-28-1935, 88 y.o., male Today's Date: 10/20/2023  END OF SESSION:  PT End of Session - 10/20/23 1543     Visit Number 2    Date for PT Re-Evaluation 01/04/24    Authorization Type BCBS Medicare    Progress Note Due on Visit 10    PT Start Time 1501   arrival   PT Stop Time 1533    PT Time Calculation (min) 32 min    Activity Tolerance Patient tolerated treatment well    Behavior During Therapy [facility] for tasks assessed/performed           Past Medical History:  Diagnosis Date   Acute gout of left ankle    06-14-2015   Bladder cancer (HCC)    BCG's tx's   BPH (benign prostatic hypertrophy)    CAD (coronary artery disease) CARDIOLOGIST-  DR TILLEY   a. NSTEMI 12/2014 -  99% dLAD-2 (not amenable to PCI), 50% dLAD-1, 60% mLAD. Medical therapy was recommended.   Cancer of kidney (HCC)    left   Cough    HARSH NONPRODUCTIVE COUGH 1 AND 1/2 WEEKS   GERD (gastroesophageal reflux disease)    takes  OTC periodically   Gilbert's syndrome    H/O cardiac catheterization    a. Note: Difficult radial access in 12/2014 - recommend femoral approach if cath needed in the future.   History of kidney stones    History of non-ST elevation myocardial infarction (NSTEMI)    12-12-2014   CARDIAC CATH W/ NO INTERVENTION X 2FEW DAYS APART   History of spinal fracture 2002   LUMBAR   Hyperlipidemia    Hypertension    Hypothyroidism    Myocardial infarction (HCC)    OSA on CPAP    SET ON 7 USES SOME NIGHTS   Paroxysmal A-fib [facility])    cardiologist-  dr blanca   Prostate cancer [facility]) 2019   last PSA  2.9  (montiored by urologist  dr nieves) [place] AND FEB 2019 RAD DONE   Shingles    2 weeks ago   Wears glasses    Past Surgical History:  Procedure Laterality Date   ATRIAL FIBRILLATION ABLATION N/A 04/01/2022   Procedure: ATRIAL FIBRILLATION ABLATION;  Surgeon: Nancey Eulas BRAVO, MD;   Location: [facility] INVASIVE CV LAB;  Service: Cardiovascular;  Laterality: N/A;   BACK SURGERY  2014   LOWER l 1, 2 4 AND 5   BILATERAL INGUINAL HERNIA REPAIR     CARDIAC CATHETERIZATION N/A 12/13/2014   Procedure: Left Heart Cath and Coronary Angiography;  Surgeon: Alm LELON Clay, MD;  Location: [facility] INVASIVE CV LAB;  Service: Cardiovascular;  Laterality: N/A;  culprit lesion diat LAD-2  99%, not approachable via PCTA  due to extremely tortuous up stream LAD/  mLAD 60% and dLAD-1 50%, both are at the extremely tortuous segment and not PCI targets/  otherwise normal coronary arteries   CARDIOVERSION N/A 08/09/2022   Procedure: CARDIOVERSION;  Surgeon: Okey Vina GAILS, MD;  Location: [facility] INVASIVE CV LAB;  Service: Cardiovascular;  Laterality: N/A;   COLONOSCOPY WITH PROPOFOL  N/A 01/16/2019   Procedure: COLONOSCOPY WITH PROPOFOL ;  Surgeon: Rosalie Kitchens, MD;  Location: [facility];  Service: Endoscopy;  Laterality: N/A;   CYSTOSCOPY W/ RETROGRADES Left 08/22/2012   Procedure: CYSTOSCOPY WITH RETROGRADE PYELOGRAM;  left kidney washings;  Surgeon: Donnice Gwenyth nieves, MD;  Location: [facility];  Service: Urology;  Laterality: Left;   CYSTOSCOPY W/ RETROGRADES N/A 07/08/2015   Procedure: CYSTOSCOPY WITH RETROGRADE PYELOGRAM;  Surgeon: Donnice Brooks, MD;  Location: [facility];  Service: Urology;  Laterality: N/A;   CYSTOSCOPY W/ URETERAL STENT PLACEMENT Left 03/24/2019   Procedure: CYSTOSCOPY WITH RETROGRADE PYELOGRAM/URETERAL STENT PLACEMENT ureteroscopy biopsy of neoplasm;  Surgeon: Brooks Donnice, MD;  Location: [facility];  Service: Urology;  Laterality: Left;   CYSTOSCOPY W/ URETERAL STENT PLACEMENT Left 04/08/2019   Procedure: CYSTOSCOPY cystogram clot evacuation left ureteroscopy clot evacuation left stent exchange liimited transuretheral resectio of prastate and fulguration  bleeders of prostate;  Surgeon: Brooks Donnice, MD;  Location: [facility];  Service: Urology;  Laterality:  Left;   CYSTOSCOPY WITH BIOPSY  08/22/2012   Procedure: CYSTOSCOPY WITH BIOPSY;  Surgeon: Donnice Gwenyth Brooks, MD;  Location: [facility];  Service: Urology;;   CYSTOSCOPY WITH BIOPSY N/A 11/08/2014   Procedure: CYSTOSCOPY/BIOPSPY BLADDER INSTILLATION OF MARCAINE  AND PYRIDIUM ;  Surgeon: Donnice Brooks, MD;  Location: [facility];  Service: Urology;  Laterality: N/A;   CYSTOSCOPY WITH BIOPSY N/A 07/08/2015   Procedure: CYSTOSCOPY WITH BLADDER BIOPSY, FULGURATION, RETROGRADE PYLOGRAM AND RENAL WASHING;  Surgeon: Donnice Brooks, MD;  Location: [facility];  Service: Urology;  Laterality: N/A;   CYSTOSCOPY WITH RETROGRADE PYELOGRAM, URETEROSCOPY AND STENT PLACEMENT Bilateral 07/30/2014   Procedure: CYSTOSCOPY WITH BLADDER BX FULERGATION AND BILATERAL RETROGRADE PYELOGRAM,;  Surgeon: Donnice Brooks, MD;  Location: [facility];  Service: Urology;  Laterality: Bilateral;   CYSTOSCOPY WITH STENT PLACEMENT Left 03/24/2018   Procedure: STENT PLACEMENT AND BIOPSY;  Surgeon: Brooks Donnice, MD;  Location: [facility];  Service: Urology;  Laterality: Left;   CYSTOSCOPY/RETROGRADE/URETEROSCOPY Bilateral 08/17/2013   Procedure: CYSTOSCOPY, BLADDER BIOPSY WITH BILATERAL RETROGRADE PYELOGRAM, LEFT URETEROSCOPY AND DILATION OF STRICTURE ;  Surgeon: Donnice Brooks, MD;  Location: [facility];  Service: Urology;  Laterality: Bilateral;   CYSTOSCOPY/RETROGRADE/URETEROSCOPY Left 03/24/2018   Procedure: CYSTOSCOPY/RETROGRADE/URETEROSCOPY;  Surgeon: Brooks Donnice, MD;  Location: [facility];  Service: Urology;  Laterality: Left;   HOT HEMOSTASIS N/A 01/16/2019   Procedure: HOT HEMOSTASIS (ARGON PLASMA COAGULATION/BICAP);  Surgeon: Rosalie Kitchens, MD;  Location: [facility];  Service: Endoscopy;  Laterality: N/A;   INSERTION OF MESH N/A 08/20/2020   Procedure: INSERTION OF MESH;  Surgeon: Rubin Calamity, MD;  [facility];   Service: General;  Laterality: N/A;   LEFT ATRIAL APPENDAGE OCCLUSION N/A 01/09/2015   Procedure: LEFT ATRIAL APPENDAGE OCCLUSION;  Surgeon: Lynwood Rakers, MD;  [facility]: [facility];  Service: Cardiovascular;  Laterality: N/A;   LEFT HEART CATH AND CORONARY ANGIOGRAPHY N/A 11/20/2021   Procedure: LEFT HEART CATH AND CORONARY ANGIOGRAPHY;  Surgeon: Wonda Sharper, MD;  Location: [facility] INVASIVE CV LAB;  Service: Cardiovascular;  Laterality: N/A;   LUMBAR LAMINECTOMY/DECOMPRESSION MICRODISCECTOMY Left 12/03/2013   Procedure: Left Lumbar three-four diskectomy with Lumbar four laminectomy ;  Surgeon: Reyes Budge, MD;  Location: [facility];  Service: Neurosurgery;  Laterality: Left;  Left Lumbar three-four diskectomy with Lumbar four laminectomy    LYMPH NODE DISSECTION Bilateral 04/13/2019   Procedure: LYMPH NODE DISSECTION;  Surgeon: Alvaro Hummer, MD;  Location: [facility];  Service: Urology;  Laterality: Bilateral;   ORIF LEFT ANKLE FX  2005   POLYPECTOMY  01/16/2019   Procedure: POLYPECTOMY;  Surgeon: Rosalie Kitchens, MD;  Location: [facility];  Service: Endoscopy;;   PROSTATE BIOPSY N/A 08/22/2012   Procedure: BIOPSY TRANSRECTAL ULTRASONIC PROSTATE (TUBP);  Surgeon: Donnice  Gwenyth Brooks, MD;  Location: [facility];  Service: Urology;  Laterality: N/A;   ROBOT ASSITED LAPAROSCOPIC NEPHROURETERECTOMY Left 04/13/2019   Procedure: XI ROBOT ASSITED LAPAROSCOPIC NEPHROURETERECTOMY;  Surgeon: Alvaro Hummer, MD;  Location: [facility];  Service: Urology;  Laterality: Left;  3 HRS   TEE WITHOUT CARDIOVERSION N/A 12/31/2014   Procedure: TRANSESOPHAGEAL ECHOCARDIOGRAM (TEE);  Surgeon: Vinie JAYSON Maxcy, MD;  Location: [facility] ENDOSCOPY;  Service: Cardiovascular;  Laterality: N/A;  ef 55-60%/  mild MR, TR, and PR/  mild LAE and RAE   TEE WITHOUT CARDIOVERSION N/A 04/01/2022   Procedure: TRANSESOPHAGEAL ECHOCARDIOGRAM;  Surgeon: Nancey Eulas BRAVO, MD;  Location: [facility];  Service:  Cardiovascular;  Laterality: N/A;   TRANSURETHRAL RESECTION OF BLADDER TUMOR  10/22/2011   Procedure: TRANSURETHRAL RESECTION OF BLADDER TUMOR (TURBT);  Surgeon: Donnice Gwenyth Brooks, MD;  Location: [facility];  Service: Urology;  Laterality: N/A;  TURBT, LEFT URETEROSCOPY, POSSIBLE URETERAL STENT    URETEROSCOPY  10/22/2011   Procedure: URETEROSCOPY;  Surgeon: Donnice Gwenyth Brooks, MD;  Location: [facility];  Service: Urology;  Laterality: Left;   WISDOM TOOTH EXTRACTION     XI ROBOTIC ASSISTED VENTRAL HERNIA N/A 08/20/2020   Procedure: ROBOTIC INCISIONAL HERNIA REPAIR WITH MESH;  Surgeon: Rubin Calamity, MD;  Location: [facility] OR;  Service: General;  Laterality: N/A;   Patient Active Problem List   Diagnosis Date Noted   Hypercoagulable state due to paroxysmal atrial fibrillation (HCC) 04/29/2022   S/P hernia repair 08/20/2020   Renal mass 04/13/2019   Gross hematuria 04/08/2019   Hyperlipidemia 12/14/2014   CAD (coronary artery disease), native coronary artery    Paroxysmal atrial fibrillation (HCC) 08/13/2014   Long term current use of anticoagulant therapy 08/13/2014   Obesity (BMI 30-39.9) 08/13/2014   Hypertensive heart disease    Sleep apnea    Bladder cancer (HCC)    Prostate cancer (HCC)    Lumbar stenosis with neurogenic claudication 12/03/2013   Upper airway cough syndrome 01/27/2013    PCP: Patrisha Jerrell PARAS, PA-C  REFERRING PROVIDER: Wonda Worth SQUIBB, PA   REFERRING DIAG: R10.9 (ICD-10-CM) - Unspecified abdominal pain  THERAPY DIAG:  Pain of upper abdomen  Other low back pain  Abnormal posture  Rationale for Evaluation and Treatment: Rehabilitation  ONSET DATE: 2021  SUBJECTIVE:                                                                                                                                                                                           SUBJECTIVE STATEMENT:  Has been doing HEP, having pain in  upper abdomen  today   PAIN:  Are you having pain? Yes NPRS scale: 4-5/10  Pain location: bil upper abdomen under rib cage  Pain type: twisting and sharp Pain description: intermittent   Aggravating factors: reaching, pulling, lifting, bending Relieving factors: compression over abdomen with painful activities   PRECAUTIONS: Other: history of cancer  RED FLAGS: None   WEIGHT BEARING RESTRICTIONS: No  FALLS:  Has patient fallen in last 6 months? No  OCCUPATION: retired  ACTIVITY LEVEL : walking, stays busy outside   PLOF: Independent  PATIENT GOALS: decrease abdominal pain   PERTINENT HISTORY:  Bladder cancer, BPH, GERD, prostate cancer, a-fib, bil inguinal hernia repair, lumbar laminectomy/decompression 2015, pulmonary fibrosis Sexual abuse: No  BOWEL MOVEMENT: *Pt states that he normally does not have issues, but due to medication for pulmonary fibrosis he has diarrhea, but feels like he can control  URINATION: WNL  INTERCOURSE:  WNL   OBJECTIVE:  Note: Objective measures were completed at Evaluation unless otherwise noted.  10/12/23 PATIENT SURVEYS:   PFIQ-7: 48  COGNITION: Overall cognitive status: Within functional limits for tasks assessed     SENSATION: Light touch: Appears intact   FUNCTIONAL TESTS:  Squat: bil valgus knee collapse, Rt>Lt Single leg stance:  Rt: pelvic drop  Lt:  pelvic drop  Curl-up test: upper abdominal distortion   GAIT: Assistive device utilized: None Comments: some antalgic Lt steps after initial rise from chair, Lt trendelenburg   POSTURE: rounded shoulders, forward head, decreased lumbar lordosis, increased thoracic kyphosis, and posterior pelvic tilt   LUMBARAROM/PROM:  A/PROM A/PROM  Eval (% available)  Flexion 50  Extension 25, relief  Right lateral flexion 75  Left lateral flexion 75  Right rotation 75  Left rotation 75   (Blank rows = not tested)  PALPATION:   General: lumbar scar tissue  restriction; tightness in bil lumbar paraspinals and trigger points in Lt glutes  Pelvic Alignment: posterior pelvic tilt  Abdominal: midline abdominal scar tissue restriction; significant tightness throughout all 4 abdominal quadrants with pain directly inferior to bil rib cage; significant increase in sternocostal angle with decreased abdominal tone in upper abdominals resulting in doming  Diastasis recti abdominals: 2-3 finger widths through midline           TODAY'S TREATMENT:                                                                                                                              DATE:  10/12/23 EVAL  Exercises: Supine lower trunk rotation 10x Open books 5x bil Modified thomas stretch 60 sec bil Seated piriformis stretch 60 sec bil Seated forward fold 60 seconds  10/20/23: Reviewed HEP answered all pt questions, confirming doing open book and seated happy baby correctly with 1 rep each - good techniques Manual - pt in supine, fascial release completed in all quadrants and scar tissue massage as needed with pt having significant restrictions in lower quadrants but present throughout, all released  moderately but restrictions still remain X10 rib mobility with active diaphragmatic breathing, Pt able to complete active rib recoil with transverse abdominis activation and exhale x10 after manual work  PATIENT EDUCATION:  Education details: See above Person educated: Patient Education method: Programmer, multimedia, Facilities manager, Actor cues, Verbal cues, and Handouts Education comprehension: verbalized understanding  HOME EXERCISE PROGRAM: [id number]  ASSESSMENT:  CLINICAL IMPRESSION: Patient is a 88 y.o. male who was seen today for physical therapy  treatment for upper abdominal pain since 2021. Session focused on manual at abdomen for improved tissue mobility and decreased to allow for improved posture, decreased pain, and synergist activity of core activation, breathing,  and mobility. Pt had good release in all quadrants, emphasis at lower quadrants as these are tightest but all released moderately, remaining tension present. Pt educated on scar massage and diaphragmatic breathing for home.  He will continue to benefit from skilled PT intervention in order to decrease pain, address impairments, and improve quality of life.  Pt was educated that he should talk to primary care provider about getting referral for bil shoulder pain and we cannot address without; pt reports understanding.   OBJECTIVE IMPAIRMENTS: decreased activity tolerance, decreased coordination, decreased endurance, decreased mobility, decreased ROM, decreased strength, increased fascial restrictions, increased muscle spasms, impaired flexibility, impaired tone, improper body mechanics, postural dysfunction, and pain.   ACTIVITY LIMITATIONS: carrying, lifting, bending, squatting, and transfers  PARTICIPATION LIMITATIONS: cleaning, laundry, community activity, and yard work  PERSONAL FACTORS: 3+ comorbidities: medical history are also affecting patient's functional outcome.   REHAB POTENTIAL: Good  CLINICAL DECISION MAKING: Evolving/moderate complexity  EVALUATION COMPLEXITY: Moderate   GOALS: Goals reviewed with patient? Yes  SHORT TERM GOALS: Target date: 11/09/2023   Pt will be independent with HEP in order to improve activity tolerance.   Baseline: only walking Goal status: INITIAL  2.  Patient will report 25% improve in abdominal pain in order to increase activity tolerance.   Baseline: 7/10 Goal status: INITIAL  3.  Pt will improve upper abdominal tone in order to decrease sternocostal angle, improve activity tolerance, and decrease pain.   Baseline:  Goal status: INITIAL  4.  Pt will increase all impaired lumbar A/ROM by 25% without pain in order to improve abdominal pain.   Baseline: most decrease in flexion and extension Goal status: INITIAL    LONG TERM GOALS:  Target date: 01/04/2024   Pt will be independent with advanced HEP in order to improve activity tolerance.   Baseline:  Goal status: INITIAL  2.  Patient will report 75% improve in abdominal pain in order to increase activity tolerance.   Baseline: 7/10 Goal status: INITIAL  3.  Patient will be able to perform single leg stance with good pelvic stability in order to demonstrate appropriate pelvic floor muscle and transversus abdominus strength and coordination in order to decrease abdominal pain and reduce risk of falls.   Baseline: pelvic drop bil Goal status: INITIAL  4.  Pt will demonstrate appropriate squat form in order to show improved bil functional hip strength and reduce risk of falls and improve pelvic support to decrease pain.  Baseline: bil valgus knee collapse, Rt>Lt Goal status: INITIAL  5.  Pt will decrease PFIQ-7 score to less than 20 in order to demonstrate improved functional ability and activity tolerance.  Baseline: 48 Goal status: INITIAL   PLAN:  PT FREQUENCY: 1-2x/week  PT DURATION: 10 visits    PLANNED INTERVENTIONS: [id number]- PT Re-evaluation, 97110-Therapeutic exercises, 97530- Therapeutic activity, [id number]- Neuromuscular re-education,  02464- Self Care, 02859- Manual therapy, [id number]- Gait training, [phone]- Aquatic Therapy, [phone]- Electrical stimulation (unattended), [phone]- Traction (mechanical), [id number]- Ionotophoresis 4mg /ml Dexamethasone , 20560 (1-2 muscles), 20561 (3+ muscles)- Dry Needling, Patient/Family education, Balance training, Taping, Joint mobilization, Joint manipulation, Spinal manipulation, Spinal mobilization, Scar mobilization, Vestibular training, Cryotherapy, Moist heat, and Biofeedback  PLAN FOR NEXT SESSION: manual techniques to lower and upper abdomen to help improve scar tissue restriction; mobility activities; core and hip strengthening  Josette Mares, PT, DPT09/11/253:48 PM

## 2023-10-27 ENCOUNTER — Ambulatory Visit: Admitting: Physical Therapy

## 2023-10-27 DIAGNOSIS — R293 Abnormal posture: Secondary | ICD-10-CM | POA: Diagnosis not present

## 2023-10-27 DIAGNOSIS — R1010 Upper abdominal pain, unspecified: Secondary | ICD-10-CM

## 2023-10-27 DIAGNOSIS — M5459 Other low back pain: Secondary | ICD-10-CM

## 2023-10-27 DIAGNOSIS — R279 Unspecified lack of coordination: Secondary | ICD-10-CM | POA: Diagnosis not present

## 2023-10-27 DIAGNOSIS — M62838 Other muscle spasm: Secondary | ICD-10-CM | POA: Diagnosis not present

## 2023-10-27 DIAGNOSIS — M6281 Muscle weakness (generalized): Secondary | ICD-10-CM | POA: Diagnosis not present

## 2023-10-27 NOTE — Therapy (Signed)
 OUTPATIENT PHYSICAL THERAPY MALE PELVIC EVALUATION   Patient Name: Charles Wright MRN: [id number] DOB:12-Jul-1935, 88 y.o., male Today's Date: 10/27/2023  END OF SESSION:  PT End of Session - 10/27/23 1406     Visit Number 3    Date for Recertification  01/04/24    Authorization Type BCBS Medicare    Progress Note Due on Visit 10    PT Start Time 1404    PT Stop Time 1443    PT Time Calculation (min) 39 min    Activity Tolerance Patient tolerated treatment well    Behavior During Therapy [facility] for tasks assessed/performed           Past Medical History:  Diagnosis Date   Acute gout of left ankle    06-14-2015   Bladder cancer (HCC)    BCG's tx's   BPH (benign prostatic hypertrophy)    CAD (coronary artery disease) CARDIOLOGIST-  DR TILLEY   a. NSTEMI 12/2014 -  99% dLAD-2 (not amenable to PCI), 50% dLAD-1, 60% mLAD. Medical therapy was recommended.   Cancer of kidney (HCC)    left   Cough    HARSH NONPRODUCTIVE COUGH 1 AND 1/2 WEEKS   GERD (gastroesophageal reflux disease)    takes  OTC periodically   Gilbert's syndrome    H/O cardiac catheterization    a. Note: Difficult radial access in 12/2014 - recommend femoral approach if cath needed in the future.   History of kidney stones    History of non-ST elevation myocardial infarction (NSTEMI)    12-12-2014   CARDIAC CATH W/ NO INTERVENTION X 2FEW DAYS APART   History of spinal fracture 2002   LUMBAR   Hyperlipidemia    Hypertension    Hypothyroidism    Myocardial infarction (HCC)    OSA on CPAP    SET ON 7 USES SOME NIGHTS   Paroxysmal A-fib [facility])    cardiologist-  dr blanca   Prostate cancer [facility]) 2019   last PSA  2.9  (montiored by urologist  dr nieves) [place] AND FEB 2019 RAD DONE   Shingles    2 weeks ago   Wears glasses    Past Surgical History:  Procedure Laterality Date   ATRIAL FIBRILLATION ABLATION N/A 04/01/2022   Procedure: ATRIAL FIBRILLATION ABLATION;  Surgeon: Nancey Eulas BRAVO, MD;  [facility] INVAS[facility] CV LAB;  Service: Cardiovascular;  Laterality: N/A;   BACK SURGERY  2014   LOWER l 1, 2 4 AND 5   BILATERAL INGUINAL HERNIA REPAIR     CARDIAC CATHETERIZATION N/A 12/13/2014   Procedure: Left Heart Cath and Coronary Angiography;  Surgeon: Alm LELON Clay, MD;  Location: [facility] INVASIVE CV LAB;  Service: Cardiovascular;  Laterality: N/A;  culprit lesion diat LAD-2  99%, not approachable via PCTA  due to extremely tortuous up stream LAD/  mLAD 60% and dLAD-1 50%, both are at the extremely tortuous segment and not PCI targets/  otherwise normal coronary arteries   CARDIOVERSION N/A 08/09/2022   Procedure: CARDIOVERSION;  Surgeon: Okey Vina GAILS, MD;  Location: [facility] INVASIVE CV LAB;  Service: Cardiovascular;  Laterality: N/A;   COLONOSCOPY WITH PROPOFOL  N/A 01/16/2019   Procedure: COLONOSCOPY WITH PROPOFOL ;  Surgeon: Rosalie Kitchens, MD;  Location: [facility];  Service: Endoscopy;  Laterality: N/A;   CYSTOSCOPY W/ RETROGRADES Left 08/22/2012   Procedure: CYSTOSCOPY WITH RETROGRADE PYELOGRAM;  left kidney washings;  Surgeon: Donnice Gwenyth nieves, MD;  Location: [facility];  Service: Urology;  Laterality:  Left;   CYSTOSCOPY W/ RETROGRADES N/A 07/08/2015   Procedure: CYSTOSCOPY WITH RETROGRADE PYELOGRAM;  Surgeon: Donnice Brooks, MD;  Location: [facility];  Service: Urology;  Laterality: N/A;   CYSTOSCOPY W/ URETERAL STENT PLACEMENT Left 03/24/2019   Procedure: CYSTOSCOPY WITH RETROGRADE PYELOGRAM/URETERAL STENT PLACEMENT ureteroscopy biopsy of neoplasm;  Surgeon: Brooks Donnice, MD;  Location: [facility];  Service: Urology;  Laterality: Left;   CYSTOSCOPY W/ URETERAL STENT PLACEMENT Left 04/08/2019   Procedure: CYSTOSCOPY cystogram clot evacuation left ureteroscopy clot evacuation left stent exchange liimited transuretheral resectio of prastate and fulguration  bleeders of prostate;  Surgeon: Brooks Donnice, MD;  Location: [facility];  Service: Urology;  Laterality: Left;    CYSTOSCOPY WITH BIOPSY  08/22/2012   Procedure: CYSTOSCOPY WITH BIOPSY;  Surgeon: Donnice Gwenyth Brooks, MD;  Location: [facility];  Service: Urology;;   CYSTOSCOPY WITH BIOPSY N/A 11/08/2014   Procedure: CYSTOSCOPY/BIOPSPY BLADDER INSTILLATION OF MARCAINE  AND PYRIDIUM ;  Surgeon: Donnice Brooks, MD;  Location: [facility];  Service: Urology;  Laterality: N/A;   CYSTOSCOPY WITH BIOPSY N/A 07/08/2015   Procedure: CYSTOSCOPY WITH BLADDER BIOPSY, FULGURATION, RETROGRADE PYLOGRAM AND RENAL WASHING;  Surgeon: Donnice Brooks, MD;  Location: [facility];  Service: Urology;  Laterality: N/A;   CYSTOSCOPY WITH RETROGRADE PYELOGRAM, URETEROSCOPY AND STENT PLACEMENT Bilateral 07/30/2014   Procedure: CYSTOSCOPY WITH BLADDER BX FULERGATION AND BILATERAL RETROGRADE PYELOGRAM,;  Surgeon: Donnice Brooks, MD;  Location[facility];  Service: Urology;  Laterality: Bilateral;   CYSTOSCOPY WITH STENT PLACEMENT Left 03/24/2018   Procedure: STENT PLACEMENT AND BIOPSY;  Surgeon: Brooks Donnice, MD;  Location: [facility];  Service: Urology;  Laterality: Left;   CYSTOSCOPY/RETROGRADE/URETEROSCOPY Bilateral 08/17/2013   Procedure: CYSTOSCOPY, BLADDER BIOPSY WITH BILATERAL RETROGRADE PYELOGRAM, LEFT URETEROSCOPY AND DILATION OF STRICTURE ;  Surgeon: Donnice Brooks, MD;  Location: [facility];  Service: Urology;  Laterality: Bilateral;   CYSTOSCOPY/RETROGRADE/URETEROSCOPY Left 03/24/2018   Procedure: CYSTOSCOPY/RETROGRADE/URETEROSCOPY;  Surgeon: Brooks Donnice, MD;  Location: [facility];  Service: Urology;  Laterality: Left;   HOT HEMOSTASIS N/A 01/16/2019   Procedure: HOT HEMOSTASIS (ARGON PLASMA COAGULATION/BICAP);  Surgeon: Rosalie Kitchens, MD;  Location: [facility];  Service: Endoscopy;  Laterality: N/A;   INSERTION OF MESH N/A 08/20/2020   Procedure: INSERTION OF MESH;  Surgeon: Rubin Calamity, MD;  Location[facility] OR;  Service:  General;  Laterality: N/A;   LEFT ATRIAL APPENDAGE OCCLUSION N/A 01/09/2015   Procedure: LEFT ATRIAL APPENDAGE OCCLUSION;  Surgeon: Lynwood Rakers, MD;  [facility];  Service: Cardiovascular;  Laterality: N/A;   LEFT HEART CATH AND CORONARY ANGIOGRAPHY N/A 11/20/2021   Procedure: LEFT HEART CATH AND CORONARY ANGIOGRAPHY;  Surgeon: Wonda Sharper, MD;  Location: [facility] INVASIVE CV LAB;  Service: Cardiovascular;  Laterality: N/A;   LUMBAR LAMINECTOMY/DECOMPRESSION MICRODISCECTOMY Left 12/03/2013   Procedure: Left Lumbar three-four diskectomy with Lumbar four laminectomy ;  Surgeon: Reyes Budge, MD;  [facility];  Service: Neurosurgery;  Laterality: Left;  Left Lumbar three-four diskectomy with Lumbar four laminectomy    LYMPH NODE DISSECTION Bilateral 04/13/2019   Procedure: LYMPH NODE DISSECTION;  Surgeon: Alvaro Hummer, MD;  Location: [facility];  Service: Urology;  Laterality: Bilateral;   ORIF LEFT ANKLE FX  2005   POLYPECTOMY  01/16/2019   Procedure: POLYPECTOMY;  Surgeon: Rosalie Kitchens, MD;  Location[facility];  Service: Endoscopy;;   PROSTATE BIOPSY N/A 08/22/2012   Procedure: BIOPSY TRANSRECTAL ULTRASONIC PROSTATE (TUBP);  Surgeon: Donnice Gwenyth  Nieves, MD;  Location: [facility];  Service: Urology;  Laterality: N/A;   ROBOT ASSITED LAPAROSCOPIC NEPHROURETERECTOMY Left 04/13/2019   Procedure: XI ROBOT ASSITED LAPAROSCOPIC NEPHROURETERECTOMY;  Surgeon: Alvaro Hummer, MD;  Location: [facility];  Service: Urology;  Laterality: Left;  3 HRS   TEE WITHOUT CARDIOVERSION N/A 12/31/2014   Procedure: TRANSESOPHAGEAL ECHOCARDIOGRAM (TEE);  Surgeon: Vinie JAYSON Maxcy, MD;  Location: [facility] ENDOSCOPY;  Service: Cardiovascular;  Laterality: N/A;  ef 55-60%/  mild MR, TR, and PR/  mild LAE and RAE   TEE WITHOUT CARDIOVERSION N/A 04/01/2022   Procedure: TRANSESOPHAGEAL ECHOCARDIOGRAM;  Surgeon: Nancey Eulas BRAVO, MD;  [facility];  Service: Cardiovascular;   Laterality: N/A;   TRANSURETHRAL RESECTION OF BLADDER TUMOR  10/22/2011   Procedure: TRANSURETHRAL RESECTION OF BLADDER TUMOR (TURBT);  Surgeon: Donnice Gwenyth Nieves, MD;  Location: [facility];  Service: Urology;  Laterality: N/A;  TURBT, LEFT URETEROSCOPY, POSSIBLE URETERAL STENT    URETEROSCOPY  10/22/2011   Procedure: URETEROSCOPY;  Surgeon: Donnice Gwenyth Nieves, MD;  Location: [facility];  Service: Urology;  Laterality: Left;   WISDOM TOOTH EXTRACTION     XI ROBOTIC ASSISTED VENTRAL HERNIA N/A 08/20/2020   Procedure: ROBOTIC INCISIONAL HERNIA REPAIR WITH MESH;  Surgeon: Rubin Calamity, MD;  Location: [facility] OR;  Service: General;  Laterality: N/A;   Patient Active Problem List   Diagnosis Date Noted   Hypercoagulable state due to paroxysmal atrial fibrillation (HCC) 04/29/2022   S/P hernia repair 08/20/2020   Renal mass 04/13/2019   Gross hematuria 04/08/2019   Hyperlipidemia 12/14/2014   CAD (coronary artery disease), native coronary artery    Paroxysmal atrial fibrillation (HCC) 08/13/2014   Long term current use of anticoagulant therapy 08/13/2014   Obesity (BMI 30-39.9) 08/13/2014   Hypertensive heart disease    Sleep apnea    Bladder cancer (HCC)    Prostate cancer (HCC)    Lumbar stenosis with neurogenic claudication 12/03/2013   Upper airway cough syndrome 01/27/2013    PCP: Patrisha Jerrell PARAS, PA-C  REFERRING PROVIDER: Wonda Worth SQUIBB, PA   REFERRING DIAG: R10.9 (ICD-10-CM) - Unspecified abdominal pain  THERAPY DIAG:  Pain of upper abdomen  Other low back pain  Abnormal posture  Muscle weakness (generalized)  Rationale for Evaluation and Treatment: Rehabilitation  ONSET DATE: 2021  SUBJECTIVE:                                                                                                                                                                                           SUBJECTIVE STATEMENT:  Has been doing  abdominal massage and  has helped a lot with pain.    PAIN:  Are you having pain? Yes NPRS scale: 2/10  Pain location: bil upper abdomen under rib cage  Pain type: twisting and sharp Pain description: intermittent   Aggravating factors: reaching, pulling, lifting, bending Relieving factors: compression over abdomen with painful activities   PRECAUTIONS: Other: history of cancer  RED FLAGS: None   WEIGHT BEARING RESTRICTIONS: No  FALLS:  Has patient fallen in last 6 months? No  OCCUPATION: retired  ACTIVITY LEVEL : walking, stays busy outside   PLOF: Independent  PATIENT GOALS: decrease abdominal pain   PERTINENT HISTORY:  Bladder cancer, BPH, GERD, prostate cancer, a-fib, bil inguinal hernia repair, lumbar laminectomy/decompression 2015, pulmonary fibrosis Sexual abuse: No  BOWEL MOVEMENT: *Pt states that he normally does not have issues, but due to medication for pulmonary fibrosis he has diarrhea, but feels like he can control  URINATION: WNL  INTERCOURSE:  WNL   OBJECTIVE:  Note: Objective measures were completed at Evaluation unless otherwise noted.  10/12/23 PATIENT SURVEYS:   PFIQ-7: 48  COGNITION: Overall cognitive status: Within functional limits for tasks assessed     SENSATION: Light touch: Appears intact   FUNCTIONAL TESTS:  Squat: bil valgus knee collapse, Rt>Lt Single leg stance:  Rt: pelvic drop  Lt:  pelvic drop  Curl-up test: upper abdominal distortion   GAIT: Assistive device utilized: None Comments: some antalgic Lt steps after initial rise from chair, Lt trendelenburg   POSTURE: rounded shoulders, forward head, decreased lumbar lordosis, increased thoracic kyphosis, and posterior pelvic tilt   LUMBARAROM/PROM:  A/PROM A/PROM  Eval (% available)  Flexion 50  Extension 25, relief  Right lateral flexion 75  Left lateral flexion 75  Right rotation 75  Left rotation 75   (Blank rows = not tested)  PALPATION:    General: lumbar scar tissue restriction; tightness in bil lumbar paraspinals and trigger points in Lt glutes  Pelvic Alignment: posterior pelvic tilt  Abdominal: midline abdominal scar tissue restriction; significant tightness throughout all 4 abdominal quadrants with pain directly inferior to bil rib cage; significant increase in sternocostal angle with decreased abdominal tone in upper abdominals resulting in doming  Diastasis recti abdominals: 2-3 finger widths through midline           TODAY'S TREATMENT:                                                                                                                              DATE:  10/12/23 EVAL  Exercises: Supine lower trunk rotation 10x Open books 5x bil Modified thomas stretch 60 sec bil Seated piriformis stretch 60 sec bil Seated forward fold 60 seconds  10/20/23: Reviewed HEP answered all pt questions, confirming doing open book and seated happy baby correctly with 1 rep each - good techniques Manual - pt in supine, fascial release completed in all quadrants and scar tissue massage as needed with pt having significant restrictions in lower  quadrants but present throughout, all released moderately but restrictions still remain X10 rib mobility with active diaphragmatic breathing, Pt able to complete active rib recoil with transverse abdominis activation and exhale x10 after manual work  10/27/23: X10 rib mobility with active diaphragmatic breathing Pt able to complete active rib recoil with transverse abdominis activation and exhale x10 after manual work Manual scar tissue mobility pt in supine with diaphragmatic breathing throughout cued, fascial release completed in all quadrants no pain and moderate release completed Updated HEP for quad back and abdominal stretching - pt demonstrated good techniques and denied pain in clinic with cat/cow and tail wags x10 each   PATIENT EDUCATION:  Education details: See above Person  educated: Patient Education method: Explanation, Demonstration, Tactile cues, Verbal cues, and Handouts Education comprehension: verbalized understanding  HOME EXERCISE PROGRAM: [id number]  ASSESSMENT:  CLINICAL IMPRESSION: Patient is a 88 y.o. male who was seen today for physical therapy  treatment for upper abdominal pain since 2021. Pt tolerated session without increased pain and reports he has had great relief with manual and scar massage since last session. Pt tolerated this well today as well and reports HEP is helpful as well. He will continue to benefit from skilled PT intervention in order to decrease pain, address impairments, and improve quality of life.     OBJECTIVE IMPAIRMENTS: decreased activity tolerance, decreased coordination, decreased endurance, decreased mobility, decreased ROM, decreased strength, increased fascial restrictions, increased muscle spasms, impaired flexibility, impaired tone, improper body mechanics, postural dysfunction, and pain.   ACTIVITY LIMITATIONS: carrying, lifting, bending, squatting, and transfers  PARTICIPATION LIMITATIONS: cleaning, laundry, community activity, and yard work  PERSONAL FACTORS: 3+ comorbidities: medical history are also affecting patient's functional outcome.   REHAB POTENTIAL: Good  CLINICAL DECISION MAKING: Evolving/moderate complexity  EVALUATION COMPLEXITY: Moderate   GOALS: Goals reviewed with patient? Yes  SHORT TERM GOALS: Target date: 11/09/2023   Pt will be independent with HEP in order to improve activity tolerance.   Baseline: only walking Goal status: INITIAL  2.  Patient will report 25% improve in abdominal pain in order to increase activity tolerance.   Baseline: 7/10 Goal status: INITIAL  3.  Pt will improve upper abdominal tone in order to decrease sternocostal angle, improve activity tolerance, and decrease pain.   Baseline:  Goal status: INITIAL  4.  Pt will increase all impaired lumbar  A/ROM by 25% without pain in order to improve abdominal pain.   Baseline: most decrease in flexion and extension Goal status: INITIAL    LONG TERM GOALS: Target date: 01/04/2024   Pt will be independent with advanced HEP in order to improve activity tolerance.   Baseline:  Goal status: INITIAL  2.  Patient will report 75% improve in abdominal pain in order to increase activity tolerance.   Baseline: 7/10 Goal status: INITIAL  3.  Patient will be able to perform single leg stance with good pelvic stability in order to demonstrate appropriate pelvic floor muscle and transversus abdominus strength and coordination in order to decrease abdominal pain and reduce risk of falls.   Baseline: pelvic drop bil Goal status: INITIAL  4.  Pt will demonstrate appropriate squat form in order to show improved bil functional hip strength and reduce risk of falls and improve pelvic support to decrease pain.  Baseline: bil valgus knee collapse, Rt>Lt Goal status: INITIAL  5.  Pt will decrease PFIQ-7 score to less than 20 in order to demonstrate improved functional ability and activity tolerance.  Baseline: 48  Goal status: INITIAL   PLAN:  PT FREQUENCY: 1-2x/week  PT DURATION: 10 visits    PLANNED INTERVENTIONS: [id number]- PT Re-evaluation, 97110-Therapeutic exercises, 97530- Therapeutic activity, 97112- Neuromuscular re-education, 97535- Self Care, 02859- Manual therapy, [phone]- Gait training, [phone]- Aquatic Therapy, [phone]- Electrical stimulation (unattended), [phone]- Traction (mechanical), [id number]- Ionotophoresis 4mg /ml Dexamethasone , 79439 (1-2 muscles), 20561 (3+ muscles)- Dry Needling, Patient/Family education, Balance training, Taping, Joint mobilization, Joint manipulation, Spinal manipulation, Spinal mobilization, Scar mobilization, Vestibular training, Cryotherapy, Moist heat, and Biofeedback  PLAN FOR NEXT SESSION: manual techniques to lower and upper abdomen to help improve scar tissue  restriction; mobility activities; core and hip strengthening  Darryle Navy, PT, DPT 09/18/253:40 PM  [facility] [address], [address] [place], [place] [address] Phone # [phone] Fax [phone]

## 2023-10-31 ENCOUNTER — Ambulatory Visit: Admitting: Physical Therapy

## 2023-10-31 DIAGNOSIS — R279 Unspecified lack of coordination: Secondary | ICD-10-CM | POA: Diagnosis not present

## 2023-10-31 DIAGNOSIS — M6281 Muscle weakness (generalized): Secondary | ICD-10-CM | POA: Diagnosis not present

## 2023-10-31 DIAGNOSIS — M62838 Other muscle spasm: Secondary | ICD-10-CM | POA: Diagnosis not present

## 2023-10-31 DIAGNOSIS — M5459 Other low back pain: Secondary | ICD-10-CM | POA: Diagnosis not present

## 2023-10-31 DIAGNOSIS — R293 Abnormal posture: Secondary | ICD-10-CM | POA: Diagnosis not present

## 2023-10-31 DIAGNOSIS — R1010 Upper abdominal pain, unspecified: Secondary | ICD-10-CM

## 2023-10-31 NOTE — Therapy (Signed)
 OUTPATIENT PHYSICAL THERAPY Male PELVIC TREATMENT   Patient Name: Charles Wright MRN: [id number] DOB:[birth year], 88 y.o., male Today's Date: 10/31/2023  END OF SESSION:  PT End of Session - 10/31/23 1104     Visit Number 4    Date for Recertification  01/04/24    Authorization Type BCBS Medicare    Progress Note Due on Visit 10    PT Start Time 1102    PT Stop Time 1143    PT Time Calculation (min) 41 min    Activity Tolerance Patient tolerated treatment well    Behavior During Therapy [facility] for tasks assessed/performed           Past Medical History:  Diagnosis Date   Acute gout of left ankle    06-14-2015   Bladder cancer (HCC)    BCG's tx's   BPH (benign prostatic hypertrophy)    CAD (coronary artery disease) CARDIOLOGIST-  DR TILLEY   a. NSTEMI 12/2014 -  99% dLAD-2 (not amenable to PCI), 50% dLAD-1, 60% mLAD. Medical therapy was recommended.   Cancer of kidney (HCC)    left   Cough    HARSH NONPRODUCTIVE COUGH 1 AND 1/2 WEEKS   GERD (gastroesophageal reflux disease)    takes  OTC periodically   Gilbert's syndrome    H/O cardiac catheterization    a. Note: Difficult radial access in 12/2014 - recommend femoral approach if cath needed in the future.   History of kidney stones    History of non-ST elevation myocardial infarction (NSTEMI)    12-12-2014   CARDIAC CATH W/ NO INTERVENTION X 2FEW DAYS APART   History of spinal fracture 2002   LUMBAR   Hyperlipidemia    Hypertension    Hypothyroidism    Myocardial infarction (HCC)    OSA on CPAP    SET ON 7 USES SOME NIGHTS   Paroxysmal A-fib [facility])    cardiologist-  dr blanca   Prostate cancer [facility]) 2019   last PSA  2.9  (montiored by urologist  dr nieves) [place] AND FEB 2019 RAD DONE   Shingles    2 weeks ago   Wears glasses    Past Surgical History:  Procedure Laterality Date   ATRIAL FIBRILLATION ABLATION N/A 04/01/2022   Procedure: ATRIAL FIBRILLATION ABLATION;  Surgeon: Nancey Eulas BRAVO, MD;  Location: [facility]  INVASIVE CV LAB;  Service: Cardiovascular;  Laterality: N/A;   BACK SURGERY  2014   LOWER l 1, 2 4 AND 5   BILATERAL INGUINAL HERNIA REPAIR     CARDIAC CATHETERIZATION N/A 12/13/2014   Procedure: Left Heart Cath and Coronary Angiography;  Surgeon: Alm LELON Clay, MD;  Location: [facility] INVASIVE CV LAB;  Service: Cardiovascular;  Laterality: N/A;  culprit lesion diat LAD-2  99%, not approachable via PCTA  due to extremely tortuous up stream LAD/  mLAD 60% and dLAD-1 50%, both are at the extremely tortuous segment and not PCI targets/  otherwise normal coronary arteries   CARDIOVERSION N/A 08/09/2022   Procedure: CARDIOVERSION;  Surgeon: Okey Vina GAILS, MD;  Location: [facility] INVASIVE CV LAB;  Service: Cardiovascular;  Laterality: N/A;   COLONOSCOPY WITH PROPOFOL  N/A 01/16/2019   Procedure: COLONOSCOPY WITH PROPOFOL ;  Surgeon: Rosalie Kitchens, MD;  Location: [facility];  Service: Endoscopy;  Laterality: N/A;   CYSTOSCOPY W/ RETROGRADES Left 08/22/2012   Procedure: CYSTOSCOPY WITH RETROGRADE PYELOGRAM;  left kidney washings;  Surgeon: Donnice Gwenyth nieves, MD;  Location: [facility];  Service: Urology;  Laterality:  Left;   CYSTOSCOPY W/ RETROGRADES N/A 07/08/2015   Procedure: CYSTOSCOPY WITH RETROGRADE PYELOGRAM;  Surgeon: Donnice Brooks, MD;  Location: [facility];  Service: Urology;  Laterality: N/A;   CYSTOSCOPY W/ URETERAL STENT PLACEMENT Left 03/24/2019   Procedure: CYSTOSCOPY WITH RETROGRADE PYELOGRAM/URETERAL STENT PLACEMENT ureteroscopy biopsy of neoplasm;  Surgeon: Brooks Donnice, MD;  Location: [facility];  Service: Urology;  Laterality: Left;   CYSTOSCOPY W/ URETERAL STENT PLACEMENT Left 04/08/2019   Procedure: CYSTOSCOPY cystogram clot evacuation left ureteroscopy clot evacuation left stent exchange liimited transuretheral resectio of prastate and fulguration  bleeders of prostate;  Surgeon: Brooks Donnice, MD;  Location: [facility];  Service: Urology;  Laterality: Left;    CYSTOSCOPY WITH BIOPSY  08/22/2012   Procedure: CYSTOSCOPY WITH BIOPSY;  Surgeon: Donnice Gwenyth Brooks, MD;  Location: [facility];  Service: Urology;;   CYSTOSCOPY WITH BIOPSY N/A 11/08/2014   Procedure: CYSTOSCOPY/BIOPSPY BLADDER INSTILLATION OF MARCAINE  AND PYRIDIUM ;  Surgeon: Donnice Brooks, MD;  Location: [facility];  Service: Urology;  Laterality: N/A;   CYSTOSCOPY WITH BIOPSY N/A 07/08/2015   Procedure: CYSTOSCOPY WITH BLADDER BIOPSY, FULGURATION, RETROGRADE PYLOGRAM AND RENAL WASHING;  Surgeon: Donnice Brooks, MD;  Location: [facility];  Service: Urology;  Laterality: N/A;   CYSTOSCOPY WITH RETROGRADE PYELOGRAM, URETEROSCOPY AND STENT PLACEMENT Bilateral 07/30/2014   Procedure: CYSTOSCOPY WITH BLADDER BX FULERGATION AND BILATERAL RETROGRADE PYELOGRAM,;  Surgeon: Donnice Brooks, MD;  Location: [facility];  Service: Urology;  Laterality: Bilateral;   CYSTOSCOPY WITH STENT PLACEMENT Left 03/24/2018   Procedure: STENT PLACEMENT AND BIOPSY;  Surgeon: Brooks Donnice, MD;  Location: [facility];  Service: Urology;  Laterality: Left;   CYSTOSCOPY/RETROGRADE/URETEROSCOPY Bilateral 08/17/2013   Procedure: CYSTOSCOPY, BLADDER BIOPSY WITH BILATERAL RETROGRADE PYELOGRAM, LEFT URETEROSCOPY AND DILATION OF STRICTURE ;  Surgeon: Donnice Brooks, MD;  Location: [facility];  Service: Urology;  Laterality: Bilateral;   CYSTOSCOPY/RETROGRADE/URETEROSCOPY Left 03/24/2018   Procedure: CYSTOSCOPY/RETROGRADE/URETEROSCOPY;  Surgeon: Brooks Donnice, MD;  Location: [facility];  Service: Urology;  Laterality: Left;   HOT HEMOSTASIS N/A 01/16/2019   Procedure: HOT HEMOSTASIS (ARGON PLASMA COAGULATION/BICAP);  Surgeon: Rosalie Kitchens, MD;  Location: [facility];  Service: Endoscopy;  Laterality: N/A;   INSERTION OF MESH N/A 08/20/2020   Procedure: INSERTION OF MESH;  Surgeon: Rubin Calamity, MD;  Location: [facility] OR;  Service:  General;  Laterality: N/A;   LEFT ATRIAL APPENDAGE OCCLUSION N/A 01/09/2015   Procedure: LEFT ATRIAL APPENDAGE OCCLUSION;  Surgeon: Lynwood Rakers, MD;  Location: [facility] INVASIVE CV LAB;  Service: Cardiovascular;  Laterality: N/A;   LEFT HEART CATH AND CORONARY ANGIOGRAPHY N/A 11/20/2021   Procedure: LEFT HEART CATH AND CORONARY ANGIOGRAPHY;  Surgeon: Wonda Sharper, MD;  Location: [facility] INVASIVE CV LAB;  Service: Cardiovascular;  Laterality: N/A;   LUMBAR LAMINECTOMY/DECOMPRESSION MICRODISCECTOMY Left 12/03/2013   Procedure: Left Lumbar three-four diskectomy with Lumbar four laminectomy ;  Surgeon: Reyes Budge, MD;  Location: [facility];  Service: Neurosurgery;  Laterality: Left;  Left Lumbar three-four diskectomy with Lumbar four laminectomy    LYMPH NODE DISSECTION Bilateral 04/13/2019   Procedure: LYMPH NODE DISSECTION;  Surgeon: Alvaro Hummer, MD;  Location: [facility];  Service: Urology;  Laterality: Bilateral;   ORIF LEFT ANKLE FX  2005   POLYPECTOMY  01/16/2019   Procedure: POLYPECTOMY;  Surgeon: Rosalie Kitchens, MD;  Location: [facility];  Service: Endoscopy;;   PROSTATE BIOPSY N/A 08/22/2012   Procedure: BIOPSY TRANSRECTAL ULTRASONIC PROSTATE (TUBP);  Surgeon: Donnice Gwenyth  Nieves, MD;  Location: [facility];  Service: Urology;  Laterality: N/A;   ROBOT ASSITED LAPAROSCOPIC NEPHROURETERECTOMY Left 04/13/2019   Procedure: XI ROBOT ASSITED LAPAROSCOPIC NEPHROURETERECTOMY;  Surgeon: Alvaro Hummer, MD;  Location: [facility];  Service: Urology;  Laterality: Left;  3 HRS   TEE WITHOUT CARDIOVERSION N/A 12/31/2014   Procedure: TRANSESOPHAGEAL ECHOCARDIOGRAM (TEE);  Surgeon: Vinie JAYSON Maxcy, MD;  Location: [facility] ENDOSCOPY;  Service: Cardiovascular;  Laterality: N/A;  ef 55-60%/  mild MR, TR, and PR/  mild LAE and RAE   TEE WITHOUT CARDIOVERSION N/A 04/01/2022   Procedure: TRANSESOPHAGEAL ECHOCARDIOGRAM;  Surgeon: Nancey Eulas BRAVO, MD;  Location: [facility] INVASIVE CV LAB;  Service: Cardiovascular;   Laterality: N/A;   TRANSURETHRAL RESECTION OF BLADDER TUMOR  10/22/2011   Procedure: TRANSURETHRAL RESECTION OF BLADDER TUMOR (TURBT);  Surgeon: Donnice Gwenyth Nieves, MD;  Location: [facility];  Service: Urology;  Laterality: N/A;  TURBT, LEFT URETEROSCOPY, POSSIBLE URETERAL STENT    URETEROSCOPY  10/22/2011   Procedure: URETEROSCOPY;  Surgeon: Donnice Gwenyth Nieves, MD;  Location: [facility];  Service: Urology;  Laterality: Left;   WISDOM TOOTH EXTRACTION     XI ROBOTIC ASSISTED VENTRAL HERNIA N/A 08/20/2020   Procedure: ROBOTIC INCISIONAL HERNIA REPAIR WITH MESH;  Surgeon: Rubin Calamity, MD;  Location: [facility] OR;  Service: General;  Laterality: N/A;   Patient Active Problem List   Diagnosis Date Noted   Hypercoagulable state due to paroxysmal atrial fibrillation (HCC) 04/29/2022   S/P hernia repair 08/20/2020   Renal mass 04/13/2019   Gross hematuria 04/08/2019   Hyperlipidemia 12/14/2014   CAD (coronary artery disease), native coronary artery    Paroxysmal atrial fibrillation (HCC) 08/13/2014   Long term current use of anticoagulant therapy 08/13/2014   Obesity (BMI 30-39.9) 08/13/2014   Hypertensive heart disease    Sleep apnea    Bladder cancer (HCC)    Prostate cancer (HCC)    Lumbar stenosis with neurogenic claudication 12/03/2013   Upper airway cough syndrome 01/27/2013    PCP: Patrisha Jerrell PARAS, PA-C  REFERRING PROVIDER: Wonda Worth SQUIBB, PA   REFERRING DIAG: R10.9 (ICD-10-CM) - Unspecified abdominal pain  THERAPY DIAG:  Pain of upper abdomen  Other low back pain  Muscle weakness (generalized)  Abnormal posture  Rationale for Evaluation and Treatment: Rehabilitation  ONSET DATE: 2021  SUBJECTIVE:                                                                                                                                                                                           SUBJECTIVE STATEMENT:  Feeling at least 50%  better since  starting PT.    PAIN:  Are you having pain? No NPRS scale: 0/10  Pain location: bil upper abdomen under rib cage  Pain type: sharp Pain description: intermittent   Aggravating factors: bending forward (upper Rt) Relieving factors: compression over abdomen with painful activities   PRECAUTIONS: Other: history of cancer  RED FLAGS: None   WEIGHT BEARING RESTRICTIONS: No  FALLS:  Has patient fallen in last 6 months? No  OCCUPATION: retired  ACTIVITY LEVEL : walking, stays busy outside   PLOF: Independent  PATIENT GOALS: decrease abdominal pain   PERTINENT HISTORY:  Bladder cancer, BPH, GERD, prostate cancer, a-fib, bil inguinal hernia repair, lumbar laminectomy/decompression 2015, pulmonary fibrosis Sexual abuse: No  BOWEL MOVEMENT: *Pt states that he normally does not have issues, but due to medication for pulmonary fibrosis he has diarrhea, but feels like he can control  URINATION: WNL  INTERCOURSE:  WNL   OBJECTIVE:  Note: Objective measures were completed at Evaluation unless otherwise noted.  10/12/23 PATIENT SURVEYS:   PFIQ-7: 48  COGNITION: Overall cognitive status: Within functional limits for tasks assessed     SENSATION: Light touch: Appears intact   FUNCTIONAL TESTS:  Squat: bil valgus knee collapse, Rt>Lt Single leg stance:  Rt: pelvic drop  Lt:  pelvic drop  Curl-up test: upper abdominal distortion   GAIT: Assistive device utilized: None Comments: some antalgic Lt steps after initial rise from chair, Lt trendelenburg   POSTURE: rounded shoulders, forward head, decreased lumbar lordosis, increased thoracic kyphosis, and posterior pelvic tilt   LUMBARAROM/PROM:  A/PROM A/PROM  Eval (% available)  Flexion 50  Extension 25, relief  Right lateral flexion 75  Left lateral flexion 75  Right rotation 75  Left rotation 75   (Blank rows = not tested)  PALPATION:   General: lumbar scar tissue restriction; tightness in  bil lumbar paraspinals and trigger points in Lt glutes  Pelvic Alignment: posterior pelvic tilt  Abdominal: midline abdominal scar tissue restriction; significant tightness throughout all 4 abdominal quadrants with pain directly inferior to bil rib cage; significant increase in sternocostal angle with decreased abdominal tone in upper abdominals resulting in doming  Diastasis recti abdominals: 2-3 finger widths through midline           TODAY'S TREATMENT:                                                                                                                              DATE:   10/27/23: X10 rib mobility with active diaphragmatic breathing Pt able to complete active rib recoil with transverse abdominis activation and exhale x10 after manual work Manual scar tissue mobility pt in supine with diaphragmatic breathing throughout cued, fascial release completed in all quadrants no pain and moderate release completed Updated HEP for quad back and abdominal stretching - pt demonstrated good techniques and denied pain in clinic with cat/cow and tail wags x10 each  10/31/23: Manual scar tissue mobility pt in supine  with diaphragmatic breathing throughout cued, fascial release completed in all quadrants no pain and moderate release completed. Rt side most restricted today so most of manual completed here Manual also completed with pt in prone at Lt lumbar paraspinals and into posterior rib mobility as pt reported tension here with stretching and good release felt 2x30s single knee to chest  Wind shield wipers x10 2x10 transverse abdominis activation with exhale 2x10 opposite hand/knee press   PATIENT EDUCATION:  Education details: See above Person educated: Patient Education method: Explanation, Demonstration, Tactile cues, Verbal cues, and Handouts Education comprehension: verbalized understanding  HOME EXERCISE PROGRAM: [id number]  ASSESSMENT:  CLINICAL IMPRESSION: Patient is a 88  y.o. male who was seen today for physical therapy  treatment for upper abdominal pain since 2021. Pt tolerated session without increased pain and reports he has had great relief with manual and scar massage since last session. Pt tolerated this well today as well and reports HEP is helpful as well. He will continue to benefit from skilled PT intervention in order to decrease pain, address impairments, and improve quality of life.     OBJECTIVE IMPAIRMENTS: decreased activity tolerance, decreased coordination, decreased endurance, decreased mobility, decreased ROM, decreased strength, increased fascial restrictions, increased muscle spasms, impaired flexibility, impaired tone, improper body mechanics, postural dysfunction, and pain.   ACTIVITY LIMITATIONS: carrying, lifting, bending, squatting, and transfers  PARTICIPATION LIMITATIONS: cleaning, laundry, community activity, and yard work  PERSONAL FACTORS: 3+ comorbidities: medical history are also affecting patient's functional outcome.   REHAB POTENTIAL: Good  CLINICAL DECISION MAKING: Evolving/moderate complexity  EVALUATION COMPLEXITY: Moderate   GOALS: Goals reviewed with patient? Yes  SHORT TERM GOALS: Target date: 11/09/2023   Pt will be independent with HEP in order to improve activity tolerance.   Baseline: only walking Goal status: INITIAL  2.  Patient will report 25% improve in abdominal pain in order to increase activity tolerance.   Baseline: 7/10 Goal status: INITIAL  3.  Pt will improve upper abdominal tone in order to decrease sternocostal angle, improve activity tolerance, and decrease pain.   Baseline:  Goal status: INITIAL  4.  Pt will increase all impaired lumbar A/ROM by 25% without pain in order to improve abdominal pain.   Baseline: most decrease in flexion and extension Goal status: INITIAL    LONG TERM GOALS: Target date: 01/04/2024   Pt will be independent with advanced HEP in order to improve  activity tolerance.   Baseline:  Goal status: INITIAL  2.  Patient will report 75% improve in abdominal pain in order to increase activity tolerance.   Baseline: 7/10 Goal status: INITIAL  3.  Patient will be able to perform single leg stance with good pelvic stability in order to demonstrate appropriate pelvic floor muscle and transversus abdominus strength and coordination in order to decrease abdominal pain and reduce risk of falls.   Baseline: pelvic drop bil Goal status: INITIAL  4.  Pt will demonstrate appropriate squat form in order to show improved bil functional hip strength and reduce risk of falls and improve pelvic support to decrease pain.  Baseline: bil valgus knee collapse, Rt>Lt Goal status: INITIAL  5.  Pt will decrease PFIQ-7 score to less than 20 in order to demonstrate improved functional ability and activity tolerance.  Baseline: 48 Goal status: INITIAL   PLAN:  PT FREQUENCY: 1-2x/week  PT DURATION: 10 visits    PLANNED INTERVENTIONS: 97164- PT Re-evaluation, 97110-Therapeutic exercises, 97530- Therapeutic activity, [id number]- Neuromuscular re-education, 97535- Self Care, 02859-  Manual therapy, [id number]- Gait training, 02886- Aquatic Therapy, [phone]- Electrical stimulation (unattended), [phone]- Traction (mechanical), [id number]- Ionotophoresis 4mg /ml Dexamethasone , 20560 (1-2 muscles), 20561 (3+ muscles)- Dry Needling, Patient/Family education, Balance training, Taping, Joint mobilization, Joint manipulation, Spinal manipulation, Spinal mobilization, Scar mobilization, Vestibular training, Cryotherapy, Moist heat, and Biofeedback  PLAN FOR NEXT SESSION: manual techniques to lower and upper abdomen to help improve scar tissue restriction; mobility activities; core and hip strengthening  Darryle Navy, PT, DPT 10/31/2510:10 PM  [facility] [address] [place], [place] [address] Phone # [phone] Fax [phone]

## 2023-11-07 ENCOUNTER — Ambulatory Visit: Payer: Self-pay | Admitting: Physical Therapy

## 2023-11-07 DIAGNOSIS — M6281 Muscle weakness (generalized): Secondary | ICD-10-CM

## 2023-11-07 DIAGNOSIS — M5459 Other low back pain: Secondary | ICD-10-CM | POA: Diagnosis not present

## 2023-11-07 DIAGNOSIS — R1010 Upper abdominal pain, unspecified: Secondary | ICD-10-CM | POA: Diagnosis not present

## 2023-11-07 DIAGNOSIS — M62838 Other muscle spasm: Secondary | ICD-10-CM

## 2023-11-07 DIAGNOSIS — R293 Abnormal posture: Secondary | ICD-10-CM | POA: Diagnosis not present

## 2023-11-07 DIAGNOSIS — R279 Unspecified lack of coordination: Secondary | ICD-10-CM | POA: Diagnosis not present

## 2023-11-07 NOTE — Therapy (Signed)
 OUTPATIENT PHYSICAL THERAPY Male PELVIC TREATMENT   Patient Name: Charles Wright MRN: [id number] DOB:[birth year], 88 y.o., male Today's Date: 11/07/2023  END OF SESSION:  PT End of Session - 11/07/23 0836     Visit Number 5    Date for Recertification  01/04/24    Authorization Type BCBS Medicare    Progress Note Due on Visit 10    PT Start Time 0845    PT Stop Time 0927    PT Time Calculation (min) 42 min    Activity Tolerance Patient tolerated treatment well    Behavior During Therapy [facility] for tasks assessed/performed           Past Medical History:  Diagnosis Date   Acute gout of left ankle    06-14-2015   Bladder cancer (HCC)    BCG's tx's   BPH (benign prostatic hypertrophy)    CAD (coronary artery disease) CARDIOLOGIST-  DR TILLEY   a. NSTEMI 12/2014 -  99% dLAD-2 (not amenable to PCI), 50% dLAD-1, 60% mLAD. Medical therapy was recommended.   Cancer of kidney (HCC)    left   Cough    HARSH NONPRODUCTIVE COUGH 1 AND 1/2 WEEKS   GERD (gastroesophageal reflux disease)    takes  OTC periodically   Gilbert's syndrome    H/O cardiac catheterization    a. Note: Difficult radial access in 12/2014 - recommend femoral approach if cath needed in the future.   History of kidney stones    History of non-ST elevation myocardial infarction (NSTEMI)    12-12-2014   CARDIAC CATH W/ NO INTERVENTION X 2FEW DAYS APART   History of spinal fracture 2002   LUMBAR   Hyperlipidemia    Hypertension    Hypothyroidism    Myocardial infarction (HCC)    OSA on CPAP    SET ON 7 USES SOME NIGHTS   Paroxysmal A-fib [facility])    cardiologist-  dr blanca   Prostate cancer [facility]) 2019   last PSA  2.9  (montiored by urologist  dr nieves) [place] AND FEB 2019 RAD DONE   Shingles    2 weeks ago   Wears glasses    Past Surgical History:  Procedure Laterality Date   ATRIAL FIBRILLATION ABLATION N/A 04/01/2022   Procedure: ATRIAL FIBRILLATION ABLATION;  Surgeon: Nancey Eulas BRAVO, MD;  [facility]: [facility]  INVASIVE CV LAB;  Service: Cardiovascular;  Laterality: N/A;   BACK SURGERY  2014   LOWER l 1, 2 4 AND 5   BILATERAL INGUINAL HERNIA REPAIR     CARDIAC CATHETERIZATION N/A 12/13/2014   Procedure: Left Heart Cath and Coronary Angiography;  Surgeon: Alm LELON Clay, MD;  Location: [facility] INVASIVE CV LAB;  Service: Cardiovascular;  Laterality: N/A;  culprit lesion diat LAD-2  99%, not approachable via PCTA  due to extremely tortuous up stream LAD/  mLAD 60% and dLAD-1 50%, both are at the extremely tortuous segment and not PCI targets/  otherwise normal coronary arteries   CARDIOVERSION N/A 08/09/2022   Procedure: CARDIOVERSION;  Surgeon: Okey Vina GAILS, MD;  Location: [facility] INVASIVE CV LAB;  Service: Cardiovascular;  Laterality: N/A;   COLONOSCOPY WITH PROPOFOL  N/A 01/16/2019   Procedure: COLONOSCOPY WITH PROPOFOL ;  Surgeon: Rosalie Kitchens, MD;  Location: [facility] ENDOSCOPY;  Service: Endoscopy;  Laterality: N/A;   CYSTOSCOPY W/ RETROGRADES Left 08/22/2012   Procedure: CYSTOSCOPY WITH RETROGRADE PYELOGRAM;  left kidney washings;  Surgeon: Donnice Gwenyth nieves, MD;  Location: [facility];  Service: Urology;  Laterality:  Left;   CYSTOSCOPY W/ RETROGRADES N/A 07/08/2015   Procedure: CYSTOSCOPY WITH RETROGRADE PYELOGRAM;  Surgeon: Donnice Brooks, MD;  Location: [facility];  Service: Urology;  Laterality: N/A;   CYSTOSCOPY W/ URETERAL STENT PLACEMENT Left 03/24/2019   Procedure: CYSTOSCOPY WITH RETROGRADE PYELOGRAM/URETERAL STENT PLACEMENT ureteroscopy biopsy of neoplasm;  Surgeon: Brooks Donnice, MD;  Location: [facility];  Service: Urology;  Laterality: Left;   CYSTOSCOPY W/ URETERAL STENT PLACEMENT Left 04/08/2019   Procedure: CYSTOSCOPY cystogram clot evacuation left ureteroscopy clot evacuation left stent exchange liimited transuretheral resectio of prastate and fulguration  bleeders of prostate;  Surgeon: Brooks Donnice, MD;  Location: [facility];  Service: Urology;  Laterality: Left;    CYSTOSCOPY WITH BIOPSY  08/22/2012   Procedure: CYSTOSCOPY WITH BIOPSY;  Surgeon: Donnice Gwenyth Brooks, MD;  Location: [facility];  Service: Urology;;   CYSTOSCOPY WITH BIOPSY N/A 11/08/2014   Procedure: CYSTOSCOPY/BIOPSPY BLADDER INSTILLATION OF MARCAINE  AND PYRIDIUM ;  Surgeon: Donnice Brooks, MD;  Location: [facility];  Service: Urology;  Laterality: N/A;   CYSTOSCOPY WITH BIOPSY N/A 07/08/2015   Procedure: CYSTOSCOPY WITH BLADDER BIOPSY, FULGURATION, RETROGRADE PYLOGRAM AND RENAL WASHING;  Surgeon: Donnice Brooks, MD;  Location: [facility];  Service: Urology;  Laterality: N/A;   CYSTOSCOPY WITH RETROGRADE PYELOGRAM, URETEROSCOPY AND STENT PLACEMENT Bilateral 07/30/2014   Procedure: CYSTOSCOPY WITH BLADDER BX FULERGATION AND BILATERAL RETROGRADE PYELOGRAM,;  Surgeon: Donnice Brooks, MD;  Location: [facility];  Service: Urology;  Laterality: Bilateral;   CYSTOSCOPY WITH STENT PLACEMENT Left 03/24/2018   Procedure: STENT PLACEMENT AND BIOPSY;  Surgeon: Brooks Donnice, MD;  Location: [facility];  Service: Urology;  Laterality: Left;   CYSTOSCOPY/RETROGRADE/URETEROSCOPY Bilateral 08/17/2013   Procedure: CYSTOSCOPY, BLADDER BIOPSY WITH BILATERAL RETROGRADE PYELOGRAM, LEFT URETEROSCOPY AND DILATION OF STRICTURE ;  Surgeon: Donnice Brooks, MD;  Location: [facility];  Service: Urology;  Laterality: Bilateral;   CYSTOSCOPY/RETROGRADE/URETEROSCOPY Left 03/24/2018   Procedure: CYSTOSCOPY/RETROGRADE/URETEROSCOPY;  Surgeon: Brooks Donnice, MD;  Location: [facility];  Service: Urology;  Laterality: Left;   HOT HEMOSTASIS N/A 01/16/2019   Procedure: HOT HEMOSTASIS (ARGON PLASMA COAGULATION/BICAP);  Surgeon: Rosalie Kitchens, MD;  Location: [facility];  Service: Endoscopy;  Laterality: N/A;   INSERTION OF MESH N/A 08/20/2020   Procedure: INSERTION OF MESH;  Surgeon: Rubin Calamity, MD;  Location: [facility] OR;  Service:  General;  Laterality: N/A;   LEFT ATRIAL APPENDAGE OCCLUSION N/A 01/09/2015   Procedure: LEFT ATRIAL APPENDAGE OCCLUSION;  Surgeon: Lynwood Rakers, MD;  [facility] INVASIVE CV LAB;  Service: Cardiovascular;  Laterality: N/A;   LEFT HEART CATH AND CORONARY ANGIOGRAPHY N/A 11/20/2021   Procedure: LEFT HEART CATH AND CORONARY ANGIOGRAPHY;  Surgeon: Wonda Sharper, MD;  Location: [facility] INVASIVE CV LAB;  Service: Cardiovascular;  Laterality: N/A;   LUMBAR LAMINECTOMY/DECOMPRESSION MICRODISCECTOMY Left 12/03/2013   Procedure: Left Lumbar three-four diskectomy with Lumbar four laminectomy ;  Surgeon: Reyes Budge, MD;  [facility];  Service: Neurosurgery;  Laterality: Left;  Left Lumbar three-four diskectomy with Lumbar four laminectomy    LYMPH NODE DISSECTION Bilateral 04/13/2019   Procedure: LYMPH NODE DISSECTION;  Surgeon: Alvaro Hummer, MD;  Location: [facility];  Service: Urology;  Laterality: Bilateral;   ORIF LEFT ANKLE FX  2005   POLYPECTOMY  01/16/2019   Procedure: POLYPECTOMY;  Surgeon: Rosalie Kitchens, MD;  Location: [facility];  Service: Endoscopy;;   PROSTATE BIOPSY N/A 08/22/2012   Procedure: BIOPSY TRANSRECTAL ULTRASONIC PROSTATE (TUBP);  Surgeon: Donnice Gwenyth  Nieves, MD;  Location: [facility];  Service: Urology;  Laterality: N/A;   ROBOT ASSITED LAPAROSCOPIC NEPHROURETERECTOMY Left 04/13/2019   Procedure: XI ROBOT ASSITED LAPAROSCOPIC NEPHROURETERECTOMY;  Surgeon: Alvaro Hummer, MD;  Location: [facility];  Service: Urology;  Laterality: Left;  3 HRS   TEE WITHOUT CARDIOVERSION N/A 12/31/2014   Procedure: TRANSESOPHAGEAL ECHOCARDIOGRAM (TEE);  Surgeon: Vinie JAYSON Maxcy, MD;  Location: [facility] ENDOSCOPY;  Service: Cardiovascular;  Laterality: N/A;  ef 55-60%/  mild MR, TR, and PR/  mild LAE and RAE   TEE WITHOUT CARDIOVERSION N/A 04/01/2022   Procedure: TRANSESOPHAGEAL ECHOCARDIOGRAM;  Surgeon: Nancey Eulas BRAVO, MD;  Location: [facility] INVASIVE CV LAB;  Service: Cardiovascular;   Laterality: N/A;   TRANSURETHRAL RESECTION OF BLADDER TUMOR  10/22/2011   Procedure: TRANSURETHRAL RESECTION OF BLADDER TUMOR (TURBT);  Surgeon: Donnice Gwenyth Nieves, MD;  Location: [facility];  Service: Urology;  Laterality: N/A;  TURBT, LEFT URETEROSCOPY, POSSIBLE URETERAL STENT    URETEROSCOPY  10/22/2011   Procedure: URETEROSCOPY;  Surgeon: Donnice Gwenyth Nieves, MD;  Location: [facility];  Service: Urology;  Laterality: Left;   WISDOM TOOTH EXTRACTION     XI ROBOTIC ASSISTED VENTRAL HERNIA N/A 08/20/2020   Procedure: ROBOTIC INCISIONAL HERNIA REPAIR WITH MESH;  Surgeon: Rubin Calamity, MD;  Location: [facility] OR;  Service: General;  Laterality: N/A;   Patient Active Problem List   Diagnosis Date Noted   Hypercoagulable state due to paroxysmal atrial fibrillation (HCC) 04/29/2022   S/P hernia repair 08/20/2020   Renal mass 04/13/2019   Gross hematuria 04/08/2019   Hyperlipidemia 12/14/2014   CAD (coronary artery disease), native coronary artery    Paroxysmal atrial fibrillation (HCC) 08/13/2014   Long term current use of anticoagulant therapy 08/13/2014   Obesity (BMI 30-39.9) 08/13/2014   Hypertensive heart disease    Sleep apnea    Bladder cancer (HCC)    Prostate cancer (HCC)    Lumbar stenosis with neurogenic claudication 12/03/2013   Upper airway cough syndrome 01/27/2013    PCP: Patrisha Jerrell PARAS, PA-C  REFERRING PROVIDER: Wonda Worth SQUIBB, PA   REFERRING DIAG: R10.9 (ICD-10-CM) - Unspecified abdominal pain  THERAPY DIAG:  Abnormal posture  Muscle weakness (generalized)  Other muscle spasm  Rationale for Evaluation and Treatment: Rehabilitation  ONSET DATE: 2021  SUBJECTIVE:                                                                                                                                                                                           SUBJECTIVE STATEMENT: Reports he is doing much better overall. This is  really working.  PAIN:  Are you having pain? No NPRS scale: 0/10  Pain location: bil upper abdomen under rib cage  Pain type: sharp Pain description: intermittent   Aggravating factors: bending forward (upper Rt) Relieving factors: compression over abdomen with painful activities   PRECAUTIONS: Other: history of cancer  RED FLAGS: None   WEIGHT BEARING RESTRICTIONS: No  FALLS:  Has patient fallen in last 6 months? No  OCCUPATION: retired  ACTIVITY LEVEL : walking, stays busy outside   PLOF: Independent  PATIENT GOALS: decrease abdominal pain   PERTINENT HISTORY:  Bladder cancer, BPH, GERD, prostate cancer, a-fib, bil inguinal hernia repair, lumbar laminectomy/decompression 2015, pulmonary fibrosis Sexual abuse: No  BOWEL MOVEMENT: *Pt states that he normally does not have issues, but due to medication for pulmonary fibrosis he has diarrhea, but feels like he can control  URINATION: WNL  INTERCOURSE:  WNL   OBJECTIVE:  Note: Objective measures were completed at Evaluation unless otherwise noted.  10/12/23 PATIENT SURVEYS:   PFIQ-7: 48  COGNITION: Overall cognitive status: Within functional limits for tasks assessed     SENSATION: Light touch: Appears intact   FUNCTIONAL TESTS:  Squat: bil valgus knee collapse, Rt>Lt Single leg stance:  Rt: pelvic drop  Lt:  pelvic drop  Curl-up test: upper abdominal distortion   GAIT: Assistive device utilized: None Comments: some antalgic Lt steps after initial rise from chair, Lt trendelenburg   POSTURE: rounded shoulders, forward head, decreased lumbar lordosis, increased thoracic kyphosis, and posterior pelvic tilt   LUMBARAROM/PROM:  A/PROM A/PROM  Eval (% available)  Flexion 50  Extension 25, relief  Right lateral flexion 75  Left lateral flexion 75  Right rotation 75  Left rotation 75   (Blank rows = not tested)  PALPATION:   General: lumbar scar tissue restriction; tightness in bil  lumbar paraspinals and trigger points in Lt glutes  Pelvic Alignment: posterior pelvic tilt  Abdominal: midline abdominal scar tissue restriction; significant tightness throughout all 4 abdominal quadrants with pain directly inferior to bil rib cage; significant increase in sternocostal angle with decreased abdominal tone in upper abdominals resulting in doming  Diastasis recti abdominals: 2-3 finger widths through midline           TODAY'S TREATMENT:                                                                                                                              DATE:   10/27/23: X10 rib mobility with active diaphragmatic breathing Pt able to complete active rib recoil with transverse abdominis activation and exhale x10 after manual work Manual scar tissue mobility pt in supine with diaphragmatic breathing throughout cued, fascial release completed in all quadrants no pain and moderate release completed Updated HEP for quad back and abdominal stretching - pt demonstrated good techniques and denied pain in clinic with cat/cow and tail wags x10 each  10/31/23: Manual scar tissue mobility pt in supine with diaphragmatic breathing throughout cued,  fascial release completed in all quadrants no pain and moderate release completed. Rt side most restricted today so most of manual completed here Manual also completed with pt in prone at Lt lumbar paraspinals and into posterior rib mobility as pt reported tension here with stretching and good release felt 2x30s single knee to chest  Wind shield wipers x10 2x10 transverse abdominis activation with exhale 2x10 opposite hand/knee press   11/07/23: Manual scar tissue mobility pt in supine with diaphragmatic breathing throughout cued, fascial release completed in all quadrants no pain and moderate release completed.  X20 transverse abdominis activation with exhale in hooklying  Hooklying bil pull downs blue band 2x10 with exhale Hooklying  unilateral pull downs blue band to opposite knee 2x10 with exhale each Hooklying bil shoulder horizontal abduction blue band 2x10   PATIENT EDUCATION:  Education details: See above Person educated: Patient Education method: Explanation, Demonstration, Tactile cues, Verbal cues, and Handouts Education comprehension: verbalized understanding  HOME EXERCISE PROGRAM: [id number]  ASSESSMENT:  CLINICAL IMPRESSION: Patient is a 88 y.o. male who was seen today for physical therapy  treatment for upper abdominal pain since 2021. Pt continues to report improvement, greatly less tension and pain. Progressed session today with core strengthening to improve bil rib flare and decreased strain at fascia and core. Pt tolerated well denied pain and reports greatly improved comfort after manual at start of session to better tolerate core exercises. Pt does benefit from verbal cues for improved transverse abdominis activation and decreased abdominal distortion. He will continue to benefit from skilled PT intervention in order to decrease pain, address impairments, and improve quality of life.     OBJECTIVE IMPAIRMENTS: decreased activity tolerance, decreased coordination, decreased endurance, decreased mobility, decreased ROM, decreased strength, increased fascial restrictions, increased muscle spasms, impaired flexibility, impaired tone, improper body mechanics, postural dysfunction, and pain.   ACTIVITY LIMITATIONS: carrying, lifting, bending, squatting, and transfers  PARTICIPATION LIMITATIONS: cleaning, laundry, community activity, and yard work  PERSONAL FACTORS: 3+ comorbidities: medical history are also affecting patient's functional outcome.   REHAB POTENTIAL: Good  CLINICAL DECISION MAKING: Evolving/moderate complexity  EVALUATION COMPLEXITY: Moderate   GOALS: Goals reviewed with patient? Yes  SHORT TERM GOALS: Target date: 11/09/2023 *all goals updated 11/07/23  Pt will be independent  with HEP in order to improve activity tolerance.   Baseline: only walking Goal status: MET  2.  Patient will report 25% improve in abdominal pain in order to increase activity tolerance.   Baseline: 7/10 Goal status: MET  3.  Pt will improve upper abdominal tone in order to decrease sternocostal angle, improve activity tolerance, and decrease pain.   Baseline:  Goal status: on going  4.  Pt will increase all impaired lumbar A/ROM by 25% without pain in order to improve abdominal pain.   Baseline: most decrease in flexion and extension Goal status: MET    LONG TERM GOALS: Target date: 01/04/2024   Pt will be independent with advanced HEP in order to improve activity tolerance.   Baseline:  Goal status: on going  2.  Patient will report 75% improve in abdominal pain in order to increase activity tolerance.   Baseline: 7/10 Goal status: on going  3.  Patient will be able to perform single leg stance with good pelvic stability in order to demonstrate appropriate pelvic floor muscle and transversus abdominus strength and coordination in order to decrease abdominal pain and reduce risk of falls.   Baseline: pelvic drop bil Goal status:on going  4.  Pt will demonstrate appropriate squat form in order to show improved bil functional hip strength and reduce risk of falls and improve pelvic support to decrease pain.  Baseline: bil valgus knee collapse, Rt>Lt Goal status: on going  5.  Pt will decrease PFIQ-7 score to less than 20 in order to demonstrate improved functional ability and activity tolerance.  Baseline: 48 Goal status: on going   PLAN:  PT FREQUENCY: 1-2x/week  PT DURATION: 10 visits    PLANNED INTERVENTIONS: [id number]- PT Re-evaluation, 97110-Therapeutic exercises, 97530- Therapeutic activity, 97112- Neuromuscular re-education, 97535- Self Care, 02859- Manual therapy, [phone]- Gait training, [phone]- Aquatic Therapy, [phone]- Electrical stimulation (unattended), [phone]-  Traction (mechanical), [id number]- Ionotophoresis 4mg /ml Dexamethasone , 79439 (1-2 muscles), 20561 (3+ muscles)- Dry Needling, Patient/Family education, Balance training, Taping, Joint mobilization, Joint manipulation, Spinal manipulation, Spinal mobilization, Scar mobilization, Vestibular training, Cryotherapy, Moist heat, and Biofeedback  PLAN FOR NEXT SESSION: manual techniques to lower and upper abdomen to help improve scar tissue restriction; mobility activities; core and hip strengthening  Darryle Navy, PT, DPT 09/29/259:40 AM  [facility] [address], [address] [place], [place] [address] Phone # [phone] Fax [phone]

## 2023-11-10 ENCOUNTER — Telehealth: Payer: Self-pay

## 2023-11-10 DIAGNOSIS — J849 Interstitial pulmonary disease, unspecified: Secondary | ICD-10-CM

## 2023-11-10 DIAGNOSIS — Z5181 Encounter for therapeutic drug level monitoring: Secondary | ICD-10-CM

## 2023-11-10 NOTE — Telephone Encounter (Signed)
 Routing to admin pool for lab appt to be scheduled

## 2023-11-10 NOTE — Telephone Encounter (Signed)
 We need his CMET.  I have ordered the labs.

## 2023-11-10 NOTE — Telephone Encounter (Signed)
 Patient scheduled.

## 2023-11-10 NOTE — Telephone Encounter (Signed)
 Copied from CRM #[id number]. Topic: Clinical - Request for Lab/Test Order >> Nov 09, 2023  2:16 PM Essie A wrote: Reason for CRM: Patient is calling schedule a lab visit but no lab orders are in his chart.  Please review and call patient to let him know if he needs lab work done at [phone] .  Thanks.

## 2023-11-11 ENCOUNTER — Other Ambulatory Visit (INDEPENDENT_AMBULATORY_CARE_PROVIDER_SITE_OTHER)

## 2023-11-11 DIAGNOSIS — J849 Interstitial pulmonary disease, unspecified: Secondary | ICD-10-CM

## 2023-11-11 DIAGNOSIS — Z5181 Encounter for therapeutic drug level monitoring: Secondary | ICD-10-CM

## 2023-11-11 LAB — COMPREHENSIVE METABOLIC PANEL WITH GFR
ALT: 20 U/L (ref 0–53)
AST: 33 U/L (ref 0–37)
Albumin: 4.2 g/dL (ref 3.5–5.2)
Alkaline Phosphatase: 74 U/L (ref 39–117)
BUN: 23 mg/dL (ref 6–23)
CO2: 27 meq/L (ref 19–32)
Calcium: 9.5 mg/dL (ref 8.4–10.5)
Chloride: 100 meq/L (ref 96–112)
Creatinine, Ser: 1.18 mg/dL (ref 0.40–1.50)
GFR: 55.83 mL/min — ABNORMAL LOW (ref >60.00)
Glucose, Bld: 89 mg/dL (ref 70–99)
Potassium: 4.6 meq/L (ref 3.5–5.1)
Sodium: 135 meq/L (ref 135–145)
Total Bilirubin: 2.1 mg/dL — ABNORMAL HIGH (ref 0.2–1.2)
Total Protein: 6.8 g/dL (ref 6.0–8.3)

## 2023-11-15 ENCOUNTER — Ambulatory Visit: Payer: Self-pay | Attending: Physician Assistant | Admitting: Physical Therapy

## 2023-11-15 DIAGNOSIS — R293 Abnormal posture: Secondary | ICD-10-CM | POA: Diagnosis not present

## 2023-11-15 DIAGNOSIS — M6281 Muscle weakness (generalized): Secondary | ICD-10-CM | POA: Diagnosis not present

## 2023-11-15 DIAGNOSIS — M5459 Other low back pain: Secondary | ICD-10-CM | POA: Diagnosis not present

## 2023-11-15 DIAGNOSIS — M62838 Other muscle spasm: Secondary | ICD-10-CM | POA: Diagnosis not present

## 2023-11-15 NOTE — Therapy (Signed)
 OUTPATIENT PHYSICAL THERAPY Male PELVIC TREATMENT   Patient Name: Charles Wright MRN: [id number] DOB:July 11, 1935, 87 y.o., male Today's Date: 11/15/2023  END OF SESSION:  PT End of Session - 11/15/23 1407     Visit Number 6    Date for Recertification  01/04/24    Authorization Type BCBS Medicare    Progress Note Due on Visit 10    PT Start Time 1402    PT Stop Time 1443    PT Time Calculation (min) 41 min    Activity Tolerance Patient tolerated treatment well    Behavior During Therapy [facility] for tasks assessed/performed           Past Medical History:  Diagnosis Date   Acute gout of left ankle    06-14-2015   Bladder cancer (HCC)    BCG's tx's   BPH (benign prostatic hypertrophy)    CAD (coronary artery disease) CARDIOLOGIST-  DR TILLEY   a. NSTEMI 12/2014 -  99% dLAD-2 (not amenable to PCI), 50% dLAD-1, 60% mLAD. Medical therapy was recommended.   Cancer of kidney (HCC)    left   Cough    HARSH NONPRODUCTIVE COUGH 1 AND 1/2 WEEKS   GERD (gastroesophageal reflux disease)    takes  OTC periodically   Gilbert's syndrome    H/O cardiac catheterization    a. Note: Difficult radial access in 12/2014 - recommend femoral approach if cath needed in the future.   History of kidney stones    History of non-ST elevation myocardial infarction (NSTEMI)    12-12-2014   CARDIAC CATH W/ NO INTERVENTION X 2FEW DAYS APART   History of spinal fracture 2002   LUMBAR   Hyperlipidemia    Hypertension    Hypothyroidism    Myocardial infarction (HCC)    OSA on CPAP    SET ON 7 USES SOME NIGHTS   Paroxysmal A-fib [facility])    cardiologist-  dr blanca   Prostate cancer [facility]) 2019   last PSA  2.9  (montiored by urologist  dr nieves) [place] AND FEB 2019 RAD DONE   Shingles    2 weeks ago   Wears glasses    Past Surgical History:  Procedure Laterality Date   ATRIAL FIBRILLATION ABLATION N/A 04/01/2022   Procedure: ATRIAL FIBRILLATION ABLATION;  Surgeon: Nancey Eulas BRAVO, MD;  Location: [facility]  INVASIVE CV LAB;  Service: Cardiovascular;  Laterality: N/A;   BACK SURGERY  2014   LOWER l 1, 2 4 AND 5   BILATERAL INGUINAL HERNIA REPAIR     CARDIAC CATHETERIZATION N/A 12/13/2014   Procedure: Left Heart Cath and Coronary Angiography;  Surgeon: Alm LELON Clay, MD;  Location: [facility] INVASIVE CV LAB;  Service: Cardiovascular;  Laterality: N/A;  culprit lesion diat LAD-2  99%, not approachable via PCTA  due to extremely tortuous up stream LAD/  mLAD 60% and dLAD-1 50%, both are at the extremely tortuous segment and not PCI targets/  otherwise normal coronary arteries   CARDIOVERSION N/A 08/09/2022   Procedure: CARDIOVERSION;  Surgeon: Okey Vina GAILS, MD;  Location: [facility] INVASIVE CV LAB;  Service: Cardiovascular;  Laterality: N/A;   COLONOSCOPY WITH PROPOFOL  N/A 01/16/2019   Procedure: COLONOSCOPY WITH PROPOFOL ;  Surgeon: Rosalie Kitchens, MD;  Location: [facility];  Service: Endoscopy;  Laterality: N/A;   CYSTOSCOPY W/ RETROGRADES Left 08/22/2012   Procedure: CYSTOSCOPY WITH RETROGRADE PYELOGRAM;  left kidney washings;  Surgeon: Donnice Gwenyth nieves, MD;  Location[facility];  Service: Urology;  Laterality:  Left;   CYSTOSCOPY W/ RETROGRADES N/A 07/08/2015   Procedure: CYSTOSCOPY WITH RETROGRADE PYELOGRAM;  Surgeon: Donnice Brooks, MD;  Location: [facility];  Service: Urology;  Laterality: N/A;   CYSTOSCOPY W/ URETERAL STENT PLACEMENT Left 03/24/2019   Procedure: CYSTOSCOPY WITH RETROGRADE PYELOGRAM/URETERAL STENT PLACEMENT ureteroscopy biopsy of neoplasm;  Surgeon: Brooks Donnice, MD;  Location: [facility];  Service: Urology;  Laterality: Left;   CYSTOSCOPY W/ URETERAL STENT PLACEMENT Left 04/08/2019   Procedure: CYSTOSCOPY cystogram clot evacuation left ureteroscopy clot evacuation left stent exchange liimited transuretheral resectio of prastate and fulguration  bleeders of prostate;  Surgeon: Brooks Donnice, MD;  Location: [facility];  Service: Urology;  Laterality: Left;    CYSTOSCOPY WITH BIOPSY  08/22/2012   Procedure: CYSTOSCOPY WITH BIOPSY;  Surgeon: Donnice Gwenyth Brooks, MD;  Location: [facility];  Service: Urology;;   CYSTOSCOPY WITH BIOPSY N/A 11/08/2014   Procedure: CYSTOSCOPY/BIOPSPY BLADDER INSTILLATION OF MARCAINE  AND PYRIDIUM ;  Surgeon: Donnice Brooks, MD;  Location: [facility];  Service: Urology;  Laterality: N/A;   CYSTOSCOPY WITH BIOPSY N/A 07/08/2015   Procedure: CYSTOSCOPY WITH BLADDER BIOPSY, FULGURATION, RETROGRADE PYLOGRAM AND RENAL WASHING;  Surgeon: Donnice Brooks, MD;  Location: [facility];  Service: Urology;  Laterality: N/A;   CYSTOSCOPY WITH RETROGRADE PYELOGRAM, URETEROSCOPY AND STENT PLACEMENT Bilateral 07/30/2014   Procedure: CYSTOSCOPY WITH BLADDER BX FULERGATION AND BILATERAL RETROGRADE PYELOGRAM,;  Surgeon: Donnice Brooks, MD;  Location: [facility];  Service: Urology;  Laterality: Bilateral;   CYSTOSCOPY WITH STENT PLACEMENT Left 03/24/2018   Procedure: STENT PLACEMENT AND BIOPSY;  Surgeon: Brooks Donnice, MD;  Location: [facility];  Service: Urology;  Laterality: Left;   CYSTOSCOPY/RETROGRADE/URETEROSCOPY Bilateral 08/17/2013   Procedure: CYSTOSCOPY, BLADDER BIOPSY WITH BILATERAL RETROGRADE PYELOGRAM, LEFT URETEROSCOPY AND DILATION OF STRICTURE ;  Surgeon: Donnice Brooks, MD;  Location: [facility];  Service: Urology;  Laterality: Bilateral;   CYSTOSCOPY/RETROGRADE/URETEROSCOPY Left 03/24/2018   Procedure: CYSTOSCOPY/RETROGRADE/URETEROSCOPY;  Surgeon: Brooks Donnice, MD;  Location: [facility];  Service: Urology;  Laterality: Left;   HOT HEMOSTASIS N/A 01/16/2019   Procedure: HOT HEMOSTASIS (ARGON PLASMA COAGULATION/BICAP);  Surgeon: Rosalie Kitchens, MD;  Location: [facility];  Service: Endoscopy;  Laterality: N/A;   INSERTION OF MESH N/A 08/20/2020   Procedure: INSERTION OF MESH;  Surgeon: Rubin Calamity, MD;  Location: [facility];  Service:  General;  Laterality: N/A;   LEFT ATRIAL APPENDAGE OCCLUSION N/A 01/09/2015   Procedure: LEFT ATRIAL APPENDAGE OCCLUSION;  Surgeon: Lynwood Rakers, MD;  [facility]: [facility] INVASIVE CV LAB;  Service: Cardiovascular;  Laterality: N/A;   LEFT HEART CATH AND CORONARY ANGIOGRAPHY N/A 11/20/2021   Procedure: LEFT HEART CATH AND CORONARY ANGIOGRAPHY;  Surgeon: Wonda Sharper, MD;  Location: [facility] INVASIVE CV LAB;  Service: Cardiovascular;  Laterality: N/A;   LUMBAR LAMINECTOMY/DECOMPRESSION MICRODISCECTOMY Left 12/03/2013   Procedure: Left Lumbar three-four diskectomy with Lumbar four laminectomy ;  Surgeon: Reyes Budge, MD;  Location: [facility];  Service: Neurosurgery;  Laterality: Left;  Left Lumbar three-four diskectomy with Lumbar four laminectomy    LYMPH NODE DISSECTION Bilateral 04/13/2019   Procedure: LYMPH NODE DISSECTION;  Surgeon: Alvaro Hummer, MD;  Location: [facility];  Service: Urology;  Laterality: Bilateral;   ORIF LEFT ANKLE FX  2005   POLYPECTOMY  01/16/2019   Procedure: POLYPECTOMY;  Surgeon: Rosalie Kitchens, MD;  Location: [facility];  Service: Endoscopy;;   PROSTATE BIOPSY N/A 08/22/2012   Procedure: BIOPSY TRANSRECTAL ULTRASONIC PROSTATE (TUBP);  Surgeon: Donnice Gwenyth  Nieves, MD;  Location: [facility];  Service: Urology;  Laterality: N/A;   ROBOT ASSITED LAPAROSCOPIC NEPHROURETERECTOMY Left 04/13/2019   Procedure: XI ROBOT ASSITED LAPAROSCOPIC NEPHROURETERECTOMY;  Surgeon: Alvaro Hummer, MD;  Location: [facility];  Service: Urology;  Laterality: Left;  3 HRS   TEE WITHOUT CARDIOVERSION N/A 12/31/2014   Procedure: TRANSESOPHAGEAL ECHOCARDIOGRAM (TEE);  Surgeon: Vinie JAYSON Maxcy, MD;  Location: [facility];  Service: Cardiovascular;  Laterality: N/A;  ef 55-60%/  mild MR, TR, and PR/  mild LAE and RAE   TEE WITHOUT CARDIOVERSION N/A 04/01/2022   Procedure: TRANSESOPHAGEAL ECHOCARDIOGRAM;  Surgeon: Nancey Eulas BRAVO, MD;  Location: [facility] INVASIVE CV LAB;  Service: Cardiovascular;   Laterality: N/A;   TRANSURETHRAL RESECTION OF BLADDER TUMOR  10/22/2011   Procedure: TRANSURETHRAL RESECTION OF BLADDER TUMOR (TURBT);  Surgeon: Donnice Gwenyth Nieves, MD;  Location: [facility];  Service: Urology;  Laterality: N/A;  TURBT, LEFT URETEROSCOPY, POSSIBLE URETERAL STENT    URETEROSCOPY  10/22/2011   Procedure: URETEROSCOPY;  Surgeon: Donnice Gwenyth Nieves, MD;  Location: [facility];  Service: Urology;  Laterality: Left;   WISDOM TOOTH EXTRACTION     XI ROBOTIC ASSISTED VENTRAL HERNIA N/A 08/20/2020   Procedure: ROBOTIC INCISIONAL HERNIA REPAIR WITH MESH;  Surgeon: Rubin Calamity, MD;  Location: [facility] OR;  Service: General;  Laterality: N/A;   Patient Active Problem List   Diagnosis Date Noted   Hypercoagulable state due to paroxysmal atrial fibrillation (HCC) 04/29/2022   S/P hernia repair 08/20/2020   Renal mass 04/13/2019   Gross hematuria 04/08/2019   Hyperlipidemia 12/14/2014   CAD (coronary artery disease), native coronary artery    Paroxysmal atrial fibrillation (HCC) 08/13/2014   Long term current use of anticoagulant therapy 08/13/2014   Obesity (BMI 30-39.9) 08/13/2014   Hypertensive heart disease    Sleep apnea    Bladder cancer (HCC)    Prostate cancer (HCC)    Lumbar stenosis with neurogenic claudication 12/03/2013   Upper airway cough syndrome 01/27/2013    PCP: Patrisha Jerrell PARAS, PA-C  REFERRING PROVIDER: Wonda Worth SQUIBB, PA   REFERRING DIAG: R10.9 (ICD-10-CM) - Unspecified abdominal pain  THERAPY DIAG:  Abnormal posture  Muscle weakness (generalized)  Other muscle spasm  Other low back pain  Rationale for Evaluation and Treatment: Rehabilitation  ONSET DATE: 2021  SUBJECTIVE:                                                                                                                                                                                           SUBJECTIVE STATEMENT: Reports he is doing much  better overall. This  is really working.    PAIN:  Are you having pain? No NPRS scale: 0/10  Pain location: bil upper abdomen under rib cage  Pain type: sharp Pain description: intermittent   Aggravating factors: bending forward (upper Rt) Relieving factors: compression over abdomen with painful activities   PRECAUTIONS: Other: history of cancer  RED FLAGS: None   WEIGHT BEARING RESTRICTIONS: No  FALLS:  Has patient fallen in last 6 months? No  OCCUPATION: retired  ACTIVITY LEVEL : walking, stays busy outside   PLOF: Independent  PATIENT GOALS: decrease abdominal pain   PERTINENT HISTORY:  Bladder cancer, BPH, GERD, prostate cancer, a-fib, bil inguinal hernia repair, lumbar laminectomy/decompression 2015, pulmonary fibrosis Sexual abuse: No  BOWEL MOVEMENT: *Pt states that he normally does not have issues, but due to medication for pulmonary fibrosis he has diarrhea, but feels like he can control  URINATION: WNL  INTERCOURSE:  WNL   OBJECTIVE:  Note: Objective measures were completed at Evaluation unless otherwise noted.  10/12/23 PATIENT SURVEYS:   PFIQ-7: 48  COGNITION: Overall cognitive status: Within functional limits for tasks assessed     SENSATION: Light touch: Appears intact   FUNCTIONAL TESTS:  Squat: bil valgus knee collapse, Rt>Lt Single leg stance:  Rt: pelvic drop  Lt:  pelvic drop  Curl-up test: upper abdominal distortion   GAIT: Assistive device utilized: None Comments: some antalgic Lt steps after initial rise from chair, Lt trendelenburg   POSTURE: rounded shoulders, forward head, decreased lumbar lordosis, increased thoracic kyphosis, and posterior pelvic tilt   LUMBARAROM/PROM:  A/PROM A/PROM  Eval (% available)  Flexion 50  Extension 25, relief  Right lateral flexion 75  Left lateral flexion 75  Right rotation 75  Left rotation 75   (Blank rows = not tested)  PALPATION:   General: lumbar scar tissue  restriction; tightness in bil lumbar paraspinals and trigger points in Lt glutes  Pelvic Alignment: posterior pelvic tilt  Abdominal: midline abdominal scar tissue restriction; significant tightness throughout all 4 abdominal quadrants with pain directly inferior to bil rib cage; significant increase in sternocostal angle with decreased abdominal tone in upper abdominals resulting in doming  Diastasis recti abdominals: 2-3 finger widths through midline           TODAY'S TREATMENT:                                                                                                                              DATE:    10/31/23: Manual scar tissue mobility pt in supine with diaphragmatic breathing throughout cued, fascial release completed in all quadrants no pain and moderate release completed. Rt side most restricted today so most of manual completed here Manual also completed with pt in prone at Lt lumbar paraspinals and into posterior rib mobility as pt reported tension here with stretching and good release felt 2x30s single knee to chest  Wind shield wipers x10 2x10 transverse abdominis activation with exhale 2x10 opposite  hand/knee press   11/07/23: Manual scar tissue mobility pt in supine with diaphragmatic breathing throughout cued, fascial release completed in all quadrants no pain and moderate release completed.  X20 transverse abdominis activation with exhale in hooklying  Hooklying bil pull downs blue band 2x10 with exhale Hooklying unilateral pull downs blue band to opposite knee 2x10 with exhale each Hooklying bil shoulder horizontal abduction blue band 2x10   11/15/23: Manual scar tissue mobility pt in supine with diaphragmatic breathing throughout cued, fascial release completed in all quadrants no pain and moderate release completed.  Hooklying bil pull downs blue band 2x10 with exhale> with double knees x20 Hooklying unilateral pull downs blue band to opposite knee 2x10 with  exhale each Hooklying x20 exhale with transverse abdominis activation  Blue band sidelying oblique rotation 2x10 each   PATIENT EDUCATION:  Education details: See above Person educated: Patient Education method: Explanation, Demonstration, Tactile cues, Verbal cues, and Handouts Education comprehension: verbalized understanding  HOME EXERCISE PROGRAM: [id number]  ASSESSMENT:  CLINICAL IMPRESSION: Patient is a 88 y.o. male who was seen today for physical therapy  treatment for upper abdominal pain since 2021. Pt continues to report improvement, greatly less tension and pain, now intermittent pain and only a few isolated locations of point tenderness in abdomen. Does still have tension in abdomen but improving. Progressed session today with core strengthening to improve bil rib flare and decreased strain at fascia and core. Pt tolerated well denied pain and reports greatly improved comfort after manual at start of session to better tolerate core exercises. Pt does continued to benefit from verbal cues for improved transverse abdominis activation and decreased abdominal distortion. Also benefits from cues for techniques with exercises without compensation. He will continue to benefit from skilled PT intervention in order to decrease pain, address impairments, and improve quality of life.     OBJECTIVE IMPAIRMENTS: decreased activity tolerance, decreased coordination, decreased endurance, decreased mobility, decreased ROM, decreased strength, increased fascial restrictions, increased muscle spasms, impaired flexibility, impaired tone, improper body mechanics, postural dysfunction, and pain.   ACTIVITY LIMITATIONS: carrying, lifting, bending, squatting, and transfers  PARTICIPATION LIMITATIONS: cleaning, laundry, community activity, and yard work  PERSONAL FACTORS: 3+ comorbidities: medical history are also affecting patient's functional outcome.   REHAB POTENTIAL: Good  CLINICAL DECISION  MAKING: Evolving/moderate complexity  EVALUATION COMPLEXITY: Moderate   GOALS: Goals reviewed with patient? Yes  SHORT TERM GOALS: Target date: 11/09/2023 *all goals updated 11/15/23  Pt will be independent with HEP in order to improve activity tolerance.   Baseline: only walking Goal status: MET  2.  Patient will report 25% improve in abdominal pain in order to increase activity tolerance.   Baseline: 7/10 Goal status: MET  3.  Pt will improve upper abdominal tone in order to decrease sternocostal angle, improve activity tolerance, and decrease pain.   Baseline:  Goal status: MET  4.  Pt will increase all impaired lumbar A/ROM by 25% without pain in order to improve abdominal pain.   Baseline: most decrease in flexion and extension Goal status: MET    LONG TERM GOALS: Target date: 01/04/2024   Pt will be independent with advanced HEP in order to improve activity tolerance.   Baseline:  Goal status: on going  2.  Patient will report 75% improve in abdominal pain in order to increase activity tolerance.   Baseline: 7/10 Goal status: MET  3.  Patient will be able to perform single leg stance with good pelvic stability in order to demonstrate  appropriate pelvic floor muscle and transversus abdominus strength and coordination in order to decrease abdominal pain and reduce risk of falls.   Baseline: pelvic drop bil Goal status:on going  4.  Pt will demonstrate appropriate squat form in order to show improved bil functional hip strength and reduce risk of falls and improve pelvic support to decrease pain.  Baseline: bil valgus knee collapse, Rt>Lt Goal status: on going  5.  Pt will decrease PFIQ-7 score to less than 20 in order to demonstrate improved functional ability and activity tolerance.  Baseline: 48 Goal status: on going   PLAN:  PT FREQUENCY: 1-2x/week  PT DURATION: 10 visits    PLANNED INTERVENTIONS: 97164- PT Re-evaluation, 97110-Therapeutic  exercises, 97530- Therapeutic activity, 97112- Neuromuscular re-education, 97535- Self Care, [id number]- Manual therapy, [phone]- Gait training, [phone]- Aquatic Therapy, [phone]- Electrical stimulation (unattended), [phone]- Traction (mechanical), [id number]- Ionotophoresis 4mg /ml Dexamethasone , 79439 (1-2 muscles), 20561 (3+ muscles)- Dry Needling, Patient/Family education, Balance training, Taping, Joint mobilization, Joint manipulation, Spinal manipulation, Spinal mobilization, Scar mobilization, Vestibular training, Cryotherapy, Moist heat, and Biofeedback  PLAN FOR NEXT SESSION: manual techniques to lower and upper abdomen to help improve scar tissue restriction; mobility activities; core and hip strengthening  Darryle Navy, PT, DPT 10/07/252:46 PM  [facility] [address] [place], [place] [address] Phone # [phone] Fax [phone]

## 2023-11-16 ENCOUNTER — Ambulatory Visit
Admission: RE | Admit: 2023-11-16 | Discharge: 2023-11-16 | Disposition: A | Source: Ambulatory Visit | Attending: Pulmonary Disease

## 2023-11-16 DIAGNOSIS — C649 Malignant neoplasm of unspecified kidney, except renal pelvis: Secondary | ICD-10-CM | POA: Diagnosis not present

## 2023-11-16 DIAGNOSIS — Z5181 Encounter for therapeutic drug level monitoring: Secondary | ICD-10-CM

## 2023-11-16 DIAGNOSIS — J849 Interstitial pulmonary disease, unspecified: Secondary | ICD-10-CM

## 2023-11-16 DIAGNOSIS — C679 Malignant neoplasm of bladder, unspecified: Secondary | ICD-10-CM | POA: Diagnosis not present

## 2023-11-16 DIAGNOSIS — C61 Malignant neoplasm of prostate: Secondary | ICD-10-CM | POA: Diagnosis not present

## 2023-11-22 DIAGNOSIS — K080 Exfoliation of teeth due to systemic causes: Secondary | ICD-10-CM | POA: Diagnosis not present

## 2023-11-23 ENCOUNTER — Ambulatory Visit: Payer: Self-pay | Admitting: Physical Therapy

## 2023-11-23 ENCOUNTER — Ambulatory Visit: Payer: Self-pay | Admitting: Pulmonary Disease

## 2023-11-23 DIAGNOSIS — M6281 Muscle weakness (generalized): Secondary | ICD-10-CM

## 2023-11-23 DIAGNOSIS — R293 Abnormal posture: Secondary | ICD-10-CM

## 2023-11-23 DIAGNOSIS — Z5181 Encounter for therapeutic drug level monitoring: Secondary | ICD-10-CM

## 2023-11-23 DIAGNOSIS — J849 Interstitial pulmonary disease, unspecified: Secondary | ICD-10-CM

## 2023-11-23 DIAGNOSIS — M5459 Other low back pain: Secondary | ICD-10-CM | POA: Diagnosis not present

## 2023-11-23 DIAGNOSIS — M62838 Other muscle spasm: Secondary | ICD-10-CM | POA: Diagnosis not present

## 2023-11-23 NOTE — Therapy (Signed)
 OUTPATIENT PHYSICAL THERAPY Male PELVIC TREATMENT   Patient Name: Charles Wright MRN: [id number] DOB:Apr 04, 1935, 88 y.o., male Today's Date: 11/23/2023  END OF SESSION:  PT End of Session - 11/23/23 1104     Visit Number 7    Date for Recertification  01/04/24    Authorization Type BCBS Medicare    Progress Note Due on Visit 10    PT Start Time 1101    PT Stop Time 1141    PT Time Calculation (min) 40 min    Activity Tolerance Patient tolerated treatment well    Behavior During Therapy [facility] for tasks assessed/performed           Past Medical History:  Diagnosis Date   Acute gout of left ankle    06-14-2015   Bladder cancer (HCC)    BCG's tx's   BPH (benign prostatic hypertrophy)    CAD (coronary artery disease) CARDIOLOGIST-  DR TILLEY   a. NSTEMI 12/2014 -  99% dLAD-2 (not amenable to PCI), 50% dLAD-1, 60% mLAD. Medical therapy was recommended.   Cancer of kidney (HCC)    left   Cough    HARSH NONPRODUCTIVE COUGH 1 AND 1/2 WEEKS   GERD (gastroesophageal reflux disease)    takes  OTC periodically   Gilbert's syndrome    H/O cardiac catheterization    a. Note: Difficult radial access in 12/2014 - recommend femoral approach if cath needed in the future.   History of kidney stones    History of non-ST elevation myocardial infarction (NSTEMI)    12-12-2014   CARDIAC CATH W/ NO INTERVENTION X 2FEW DAYS APART   History of spinal fracture 2002   LUMBAR   Hyperlipidemia    Hypertension    Hypothyroidism    Myocardial infarction (HCC)    OSA on CPAP    SET ON 7 USES SOME NIGHTS   Paroxysmal A-fib [facility])    cardiologist-  dr blanca   Prostate cancer [facility]) 2019   last PSA  2.9  (montiored by urologist  dr nieves) [place] AND FEB 2019 RAD DONE   Shingles    2 weeks ago   Wears glasses    Past Surgical History:  Procedure Laterality Date   ATRIAL FIBRILLATION ABLATION N/A 04/01/2022   Procedure: ATRIAL FIBRILLATION ABLATION;  Surgeon: Nancey Eulas BRAVO, MD;  Location:  [facility] INVASIVE CV LAB;  Service: Cardiovascular;  Laterality: N/A;   BACK SURGERY  2014   LOWER l 1, 2 4 AND 5   BILATERAL INGUINAL HERNIA REPAIR     CARDIAC CATHETERIZATION N/A 12/13/2014   Procedure: Left Heart Cath and Coronary Angiography;  Surgeon: Alm LELON Clay, MD;  Location: [facility] INVASIVE CV LAB;  Service: Cardiovascular;  Laterality: N/A;  culprit lesion diat LAD-2  99%, not approachable via PCTA  due to extremely tortuous up stream LAD/  mLAD 60% and dLAD-1 50%, both are at the extremely tortuous segment and not PCI targets/  otherwise normal coronary arteries   CARDIOVERSION N/A 08/09/2022   Procedure: CARDIOVERSION;  Surgeon: Okey Vina GAILS, MD;  Location: [facility] INVASIVE CV LAB;  Service: Cardiovascular;  Laterality: N/A;   COLONOSCOPY WITH PROPOFOL  N/A 01/16/2019   Procedure: COLONOSCOPY WITH PROPOFOL ;  Surgeon: Rosalie Kitchens, MD;  Location: [facility];  Service: Endoscopy;  Laterality: N/A;   CYSTOSCOPY W/ RETROGRADES Left 08/22/2012   Procedure: CYSTOSCOPY WITH RETROGRADE PYELOGRAM;  left kidney washings;  Surgeon: Donnice Gwenyth nieves, MD;  Location: [facility];  Service: Urology;  Laterality:  Left;   CYSTOSCOPY W/ RETROGRADES N/A 07/08/2015   Procedure: CYSTOSCOPY WITH RETROGRADE PYELOGRAM;  Surgeon: Donnice Brooks, MD;  Location: [facility];  Service: Urology;  Laterality: N/A;   CYSTOSCOPY W/ URETERAL STENT PLACEMENT Left 03/24/2019   Procedure: CYSTOSCOPY WITH RETROGRADE PYELOGRAM/URETERAL STENT PLACEMENT ureteroscopy biopsy of neoplasm;  Surgeon: Brooks Donnice, MD;  Location: [facility];  Service: Urology;  Laterality: Left;   CYSTOSCOPY W/ URETERAL STENT PLACEMENT Left 04/08/2019   Procedure: CYSTOSCOPY cystogram clot evacuation left ureteroscopy clot evacuation left stent exchange liimited transuretheral resectio of prastate and fulguration  bleeders of prostate;  Surgeon: Brooks Donnice, MD;  Location: [facility];  Service: Urology;  Laterality: Left;    CYSTOSCOPY WITH BIOPSY  08/22/2012   Procedure: CYSTOSCOPY WITH BIOPSY;  Surgeon: Donnice Gwenyth Brooks, MD;  Location: [facility];  Service: Urology;;   CYSTOSCOPY WITH BIOPSY N/A 11/08/2014   Procedure: CYSTOSCOPY/BIOPSPY BLADDER INSTILLATION OF MARCAINE  AND PYRIDIUM ;  Surgeon: Donnice Brooks, MD;  Location: [facility];  Service: Urology;  Laterality: N/A;   CYSTOSCOPY WITH BIOPSY N/A 07/08/2015   Procedure: CYSTOSCOPY WITH BLADDER BIOPSY, FULGURATION, RETROGRADE PYLOGRAM AND RENAL WASHING;  Surgeon: Donnice Brooks, MD;  Location: [facility];  Service: Urology;  Laterality: N/A;   CYSTOSCOPY WITH RETROGRADE PYELOGRAM, URETEROSCOPY AND STENT PLACEMENT Bilateral 07/30/2014   Procedure: CYSTOSCOPY WITH BLADDER BX FULERGATION AND BILATERAL RETROGRADE PYELOGRAM,;  Surgeon: Donnice Brooks, MD;  Location: [facility];  Service: Urology;  Laterality: Bilateral;   CYSTOSCOPY WITH STENT PLACEMENT Left 03/24/2018   Procedure: STENT PLACEMENT AND BIOPSY;  Surgeon: Brooks Donnice, MD;  Location: [facility];  Service: Urology;  Laterality: Left;   CYSTOSCOPY/RETROGRADE/URETEROSCOPY Bilateral 08/17/2013   Procedure: CYSTOSCOPY, BLADDER BIOPSY WITH BILATERAL RETROGRADE PYELOGRAM, LEFT URETEROSCOPY AND DILATION OF STRICTURE ;  Surgeon: Donnice Brooks, MD;  Location: [facility];  Service: Urology;  Laterality: Bilateral;   CYSTOSCOPY/RETROGRADE/URETEROSCOPY Left 03/24/2018   Procedure: CYSTOSCOPY/RETROGRADE/URETEROSCOPY;  Surgeon: Brooks Donnice, MD;  Location: [facility];  Service: Urology;  Laterality: Left;   HOT HEMOSTASIS N/A 01/16/2019   Procedure: HOT HEMOSTASIS (ARGON PLASMA COAGULATION/BICAP);  Surgeon: Rosalie Kitchens, MD;  Location: [facility];  Service: Endoscopy;  Laterality: N/A;   INSERTION OF MESH N/A 08/20/2020   Procedure: INSERTION OF MESH;  Surgeon: Rubin Calamity, MD;  Location: [facility] OR;  Service:  General;  Laterality: N/A;   LEFT ATRIAL APPENDAGE OCCLUSION N/A 01/09/2015   Procedure: LEFT ATRIAL APPENDAGE OCCLUSION;  Surgeon: Lynwood Rakers, MD;  [facility];  Service: Cardiovascular;  Laterality: N/A;   LEFT HEART CATH AND CORONARY ANGIOGRAPHY N/A 11/20/2021   Procedure: LEFT HEART CATH AND CORONARY ANGIOGRAPHY;  Surgeon: Wonda Sharper, MD;  Location: [facility] INVASIVE CV LAB;  Service: Cardiovascular;  Laterality: N/A;   LUMBAR LAMINECTOMY/DECOMPRESSION MICRODISCECTOMY Left 12/03/2013   Procedure: Left Lumbar three-four diskectomy with Lumbar four laminectomy ;  Surgeon: Reyes Budge, MD;  [facility];  Service: Neurosurgery;  Laterality: Left;  Left Lumbar three-four diskectomy with Lumbar four laminectomy    LYMPH NODE DISSECTION Bilateral 04/13/2019   Procedure: LYMPH NODE DISSECTION;  Surgeon: Alvaro Hummer, MD;  Location: [facility];  Service: Urology;  Laterality: Bilateral;   ORIF LEFT ANKLE FX  2005   POLYPECTOMY  01/16/2019   Procedure: POLYPECTOMY;  Surgeon: Rosalie Kitchens, MD;  Location: [facility];  Service: Endoscopy;;   PROSTATE BIOPSY N/A 08/22/2012   Procedure: BIOPSY TRANSRECTAL ULTRASONIC PROSTATE (TUBP);  Surgeon: Donnice Gwenyth  Nieves, MD;  Location: [facility];  Service: Urology;  Laterality: N/A;   ROBOT ASSITED LAPAROSCOPIC NEPHROURETERECTOMY Left 04/13/2019   Procedure: XI ROBOT ASSITED LAPAROSCOPIC NEPHROURETERECTOMY;  Surgeon: Alvaro Hummer, MD;  Location: [facility];  Service: Urology;  Laterality: Left;  3 HRS   TEE WITHOUT CARDIOVERSION N/A 12/31/2014   Procedure: TRANSESOPHAGEAL ECHOCARDIOGRAM (TEE);  Surgeon: Vinie JAYSON Maxcy, MD;  Location: [facility] ENDOSCOPY;  Service: Cardiovascular;  Laterality: N/A;  ef 55-60%/  mild MR, TR, and PR/  mild LAE and RAE   TEE WITHOUT CARDIOVERSION N/A 04/01/2022   Procedure: TRANSESOPHAGEAL ECHOCARDIOGRAM;  Surgeon: Nancey Eulas BRAVO, MD;  [facility];  Service: Cardiovascular;   Laterality: N/A;   TRANSURETHRAL RESECTION OF BLADDER TUMOR  10/22/2011   Procedure: TRANSURETHRAL RESECTION OF BLADDER TUMOR (TURBT);  Surgeon: Donnice Gwenyth Nieves, MD;  Location: [facility];  Service: Urology;  Laterality: N/A;  TURBT, LEFT URETEROSCOPY, POSSIBLE URETERAL STENT    URETEROSCOPY  10/22/2011   Procedure: URETEROSCOPY;  Surgeon: Donnice Gwenyth Nieves, MD;  Location: [facility];  Service: Urology;  Laterality: Left;   WISDOM TOOTH EXTRACTION     XI ROBOTIC ASSISTED VENTRAL HERNIA N/A 08/20/2020   Procedure: ROBOTIC INCISIONAL HERNIA REPAIR WITH MESH;  Surgeon: Rubin Calamity, MD;  Location: [facility] OR;  Service: General;  Laterality: N/A;   Patient Active Problem List   Diagnosis Date Noted   Hypercoagulable state due to paroxysmal atrial fibrillation (HCC) 04/29/2022   S/P hernia repair 08/20/2020   Renal mass 04/13/2019   Gross hematuria 04/08/2019   Hyperlipidemia 12/14/2014   CAD (coronary artery disease), native coronary artery    Paroxysmal atrial fibrillation (HCC) 08/13/2014   Long term current use of anticoagulant therapy 08/13/2014   Obesity (BMI 30-39.9) 08/13/2014   Hypertensive heart disease    Sleep apnea    Bladder cancer (HCC)    Prostate cancer (HCC)    Lumbar stenosis with neurogenic claudication 12/03/2013   Upper airway cough syndrome 01/27/2013    PCP: Patrisha Jerrell PARAS, PA-C  REFERRING PROVIDER: Wonda Worth SQUIBB, PA   REFERRING DIAG: R10.9 (ICD-10-CM) - Unspecified abdominal pain  THERAPY DIAG:  Abnormal posture  Muscle weakness (generalized)  Rationale for Evaluation and Treatment: Rehabilitation  ONSET DATE: 2021  SUBJECTIVE:                                                                                                                                                                                           SUBJECTIVE STATEMENT: Only has tenderness at upper Lt abdomen intermittently but resolves  with massage.     PAIN:  Are you having pain? No NPRS scale: 0/10  Pain location: bil upper abdomen under rib cage  Pain type: sharp Pain description: intermittent   Aggravating factors: bending forward (upper Rt) Relieving factors: compression over abdomen with painful activities   PRECAUTIONS: Other: history of cancer  RED FLAGS: None   WEIGHT BEARING RESTRICTIONS: No  FALLS:  Has patient fallen in last 6 months? No  OCCUPATION: retired  ACTIVITY LEVEL : walking, stays busy outside   PLOF: Independent  PATIENT GOALS: decrease abdominal pain   PERTINENT HISTORY:  Bladder cancer, BPH, GERD, prostate cancer, a-fib, bil inguinal hernia repair, lumbar laminectomy/decompression 2015, pulmonary fibrosis Sexual abuse: No  BOWEL MOVEMENT: *Pt states that he normally does not have issues, but due to medication for pulmonary fibrosis he has diarrhea, but feels like he can control  URINATION: WNL  INTERCOURSE:  WNL   OBJECTIVE:  Note: Objective measures were completed at Evaluation unless otherwise noted.  10/12/23 PATIENT SURVEYS:   PFIQ-7: 48  COGNITION: Overall cognitive status: Within functional limits for tasks assessed     SENSATION: Light touch: Appears intact   FUNCTIONAL TESTS:  Squat: bil valgus knee collapse, Rt>Lt Single leg stance:  Rt: pelvic drop  Lt:  pelvic drop  Curl-up test: upper abdominal distortion   GAIT: Assistive device utilized: None Comments: some antalgic Lt steps after initial rise from chair, Lt trendelenburg   POSTURE: rounded shoulders, forward head, decreased lumbar lordosis, increased thoracic kyphosis, and posterior pelvic tilt   LUMBARAROM/PROM:  A/PROM A/PROM  Eval (% available)  Flexion 50  Extension 25, relief  Right lateral flexion 75  Left lateral flexion 75  Right rotation 75  Left rotation 75   (Blank rows = not tested)  PALPATION:   General: lumbar scar tissue restriction; tightness in bil lumbar  paraspinals and trigger points in Lt glutes  Pelvic Alignment: posterior pelvic tilt  Abdominal: midline abdominal scar tissue restriction; significant tightness throughout all 4 abdominal quadrants with pain directly inferior to bil rib cage; significant increase in sternocostal angle with decreased abdominal tone in upper abdominals resulting in doming  Diastasis recti abdominals: 2-3 finger widths through midline           TODAY'S TREATMENT:                                                                                                                              DATE:   11/23/23: Manual scar tissue mobility pt in supine with diaphragmatic breathing throughout cued, fascial release completed in all quadrants no pain and moderate release completed. Rt side most restricted today so most of manual completed here X10 blue band diagonals standing with transverse abdominis activation  Blue band bil shoulder horizontal abduction 2x10 standing with transverse abdominis activation   Blue band palloffs 2x10 each Rotational palloffs 2x10 blue band 2x10 each Anti rotations 10# standing with cross body high/low 2x10 each 7# going to Lt upper due to chronic  shoulder limitations Cross body 7# with opposite hip flexion x10 15# farmers carry 1000' each hand  10/31/23: Manual scar tissue mobility pt in supine with diaphragmatic breathing throughout cued, fascial release completed in all quadrants no pain and moderate release completed. Rt side most restricted today so most of manual completed here Manual also completed with pt in prone at Lt lumbar paraspinals and into posterior rib mobility as pt reported tension here with stretching and good release felt 2x30s single knee to chest  Wind shield wipers x10 2x10 transverse abdominis activation with exhale 2x10 opposite hand/knee press   11/07/23: Manual scar tissue mobility pt in supine with diaphragmatic breathing throughout cued, fascial release  completed in all quadrants no pain and moderate release completed.  X20 transverse abdominis activation with exhale in hooklying  Hooklying bil pull downs blue band 2x10 with exhale Hooklying unilateral pull downs blue band to opposite knee 2x10 with exhale each Hooklying bil shoulder horizontal abduction blue band 2x10   11/15/23: Manual scar tissue mobility pt in supine with diaphragmatic breathing throughout cued, fascial release completed in all quadrants no pain and moderate release completed.  Hooklying bil pull downs blue band 2x10 with exhale> with double knees x20 Hooklying unilateral pull downs blue band to opposite knee 2x10 with exhale each Hooklying x20 exhale with transverse abdominis activation  Blue band sidelying oblique rotation 2x10 each   PATIENT EDUCATION:  Education details: See above Person educated: Patient Education method: Explanation, Demonstration, Tactile cues, Verbal cues, and Handouts Education comprehension: verbalized understanding  HOME EXERCISE PROGRAM: [id number]  ASSESSMENT:  CLINICAL IMPRESSION: Patient is a 88 y.o. male who was seen today for physical therapy  treatment for upper abdominal pain since 2021. Pt continues to report improvement, greatly less tension and pain, now intermittent pain and only a few isolated locations of point tenderness in abdomen but resolves with tissue massage. Pt tolerated session well denied pain and reports greatly improved comfort after manual at start of session to better tolerate core exercises. Pt does continued to benefit from verbal cues for improved transverse abdominis activation and demonstrating decreased abdominal distortion. Progressing well to improved core strength with progression today with standing core strengthening to decreased rib angle and improve tension at upper abdomen. He will continue to benefit from skilled PT intervention in order to decrease pain, address impairments, and improve quality of  life.     OBJECTIVE IMPAIRMENTS: decreased activity tolerance, decreased coordination, decreased endurance, decreased mobility, decreased ROM, decreased strength, increased fascial restrictions, increased muscle spasms, impaired flexibility, impaired tone, improper body mechanics, postural dysfunction, and pain.   ACTIVITY LIMITATIONS: carrying, lifting, bending, squatting, and transfers  PARTICIPATION LIMITATIONS: cleaning, laundry, community activity, and yard work  PERSONAL FACTORS: 3+ comorbidities: medical history are also affecting patient's functional outcome.   REHAB POTENTIAL: Good  CLINICAL DECISION MAKING: Evolving/moderate complexity  EVALUATION COMPLEXITY: Moderate   GOALS: Goals reviewed with patient? Yes  SHORT TERM GOALS: Target date: 11/09/2023 *all goals updated 11/15/23  Pt will be independent with HEP in order to improve activity tolerance.   Baseline: only walking Goal status: MET  2.  Patient will report 25% improve in abdominal pain in order to increase activity tolerance.   Baseline: 7/10 Goal status: MET  3.  Pt will improve upper abdominal tone in order to decrease sternocostal angle, improve activity tolerance, and decrease pain.   Baseline:  Goal status: MET  4.  Pt will increase all impaired lumbar A/ROM by 25% without pain in order  to improve abdominal pain.   Baseline: most decrease in flexion and extension Goal status: MET    LONG TERM GOALS: Target date: 01/04/2024   Pt will be independent with advanced HEP in order to improve activity tolerance.   Baseline:  Goal status: on going  2.  Patient will report 75% improve in abdominal pain in order to increase activity tolerance.   Baseline: 7/10 Goal status: MET  3.  Patient will be able to perform single leg stance with good pelvic stability in order to demonstrate appropriate pelvic floor muscle and transversus abdominus strength and coordination in order to decrease abdominal  pain and reduce risk of falls.   Baseline: pelvic drop bil Goal status:on going  4.  Pt will demonstrate appropriate squat form in order to show improved bil functional hip strength and reduce risk of falls and improve pelvic support to decrease pain.  Baseline: bil valgus knee collapse, Rt>Lt Goal status: on going  5.  Pt will decrease PFIQ-7 score to less than 20 in order to demonstrate improved functional ability and activity tolerance.  Baseline: 48 Goal status: on going   PLAN:  PT FREQUENCY: 1-2x/week  PT DURATION: 10 visits    PLANNED INTERVENTIONS: 97164- PT Re-evaluation, 97110-Therapeutic exercises, 97530- Therapeutic activity, 97112- Neuromuscular re-education, 97535- Self Care, 02859- Manual therapy, [phone]- Gait training, [phone]- Aquatic Therapy, [phone]- Electrical stimulation (unattended), [phone]- Traction (mechanical), [id number]- Ionotophoresis 4mg /ml Dexamethasone , 79439 (1-2 muscles), 20561 (3+ muscles)- Dry Needling, Patient/Family education, Balance training, Taping, Joint mobilization, Joint manipulation, Spinal manipulation, Spinal mobilization, Scar mobilization, Vestibular training, Cryotherapy, Moist heat, and Biofeedback  PLAN FOR NEXT SESSION: manual techniques to lower and upper abdomen to help improve scar tissue restriction; mobility activities; core and hip strengthening  Darryle Navy, PT, DPT 11/22/2509:43 AM  [facility] [address], [address] [place], [place] [address] Phone # [phone] Fax [phone]

## 2023-11-29 ENCOUNTER — Ambulatory Visit: Payer: Self-pay | Admitting: Pulmonary Disease

## 2023-11-29 ENCOUNTER — Ambulatory Visit: Admitting: Physical Therapy

## 2023-11-29 DIAGNOSIS — M62838 Other muscle spasm: Secondary | ICD-10-CM

## 2023-11-29 DIAGNOSIS — R293 Abnormal posture: Secondary | ICD-10-CM | POA: Diagnosis not present

## 2023-11-29 DIAGNOSIS — M6281 Muscle weakness (generalized): Secondary | ICD-10-CM

## 2023-11-29 DIAGNOSIS — M5459 Other low back pain: Secondary | ICD-10-CM | POA: Diagnosis not present

## 2023-11-29 NOTE — Therapy (Signed)
 OUTPATIENT PHYSICAL THERAPY Male PELVIC TREATMENT   Patient Name: Charles Wright MRN: [id number] DOB:[birth year], 88 y.o., male Today's Date: 11/29/2023  END OF SESSION:  PT End of Session - 11/29/23 1452     Visit Number 8    Date for Recertification  01/04/24    Authorization Type BCBS Medicare    Progress Note Due on Visit 10    PT Start Time 1449    PT Stop Time 1530    PT Time Calculation (min) 41 min    Activity Tolerance Patient tolerated treatment well    Behavior During Therapy [facility] for tasks assessed/performed           Past Medical History:  Diagnosis Date   Acute gout of left ankle    06-14-2015   Bladder cancer (HCC)    BCG's tx's   BPH (benign prostatic hypertrophy)    CAD (coronary artery disease) CARDIOLOGIST-  DR TILLEY   a. NSTEMI 12/2014 -  99% dLAD-2 (not amenable to PCI), 50% dLAD-1, 60% mLAD. Medical therapy was recommended.   Cancer of kidney (HCC)    left   Cough    HARSH NONPRODUCTIVE COUGH 1 AND 1/2 WEEKS   GERD (gastroesophageal reflux disease)    takes  OTC periodically   Gilbert's syndrome    H/O cardiac catheterization    a. Note: Difficult radial access in 12/2014 - recommend femoral approach if cath needed in the future.   History of kidney stones    History of non-ST elevation myocardial infarction (NSTEMI)    12-12-2014   CARDIAC CATH W/ NO INTERVENTION X 2FEW DAYS APART   History of spinal fracture 2002   LUMBAR   Hyperlipidemia    Hypertension    Hypothyroidism    Myocardial infarction (HCC)    OSA on CPAP    SET ON 7 USES SOME NIGHTS   Paroxysmal A-fib [facility])    cardiologist-  dr blanca   Prostate cancer [facility]) 2019   last PSA  2.9  (montiored by urologist  dr nieves) [place] AND FEB 2019 RAD DONE   Shingles    2 weeks ago   Wears glasses    Past Surgical History:  Procedure Laterality Date   ATRIAL FIBRILLATION ABLATION N/A 04/01/2022   Procedure: ATRIAL FIBRILLATION ABLATION;  Surgeon: Nancey Eulas BRAVO, MD;  Location:  [facility] INVASIVE CV LAB;  Service: Cardiovascular;  Laterality: N/A;   BACK SURGERY  2014   LOWER l 1, 2 4 AND 5   BILATERAL INGUINAL HERNIA REPAIR     CARDIAC CATHETERIZATION N/A 12/13/2014   Procedure: Left Heart Cath and Coronary Angiography;  Surgeon: Alm LELON Clay, MD;  Location: [facility] INVASIVE CV LAB;  Service: Cardiovascular;  Laterality: N/A;  culprit lesion diat LAD-2  99%, not approachable via PCTA  due to extremely tortuous up stream LAD/  mLAD 60% and dLAD-1 50%, both are at the extremely tortuous segment and not PCI targets/  otherwise normal coronary arteries   CARDIOVERSION N/A 08/09/2022   Procedure: CARDIOVERSION;  Surgeon: Okey Vina GAILS, MD;  Location: [facility] INVASIVE CV LAB;  Service: Cardiovascular;  Laterality: N/A;   COLONOSCOPY WITH PROPOFOL  N/A 01/16/2019   Procedure: COLONOSCOPY WITH PROPOFOL ;  Surgeon: Rosalie Kitchens, MD;  Location: [facility];  Service: Endoscopy;  Laterality: N/A;   CYSTOSCOPY W/ RETROGRADES Left 08/22/2012   Procedure: CYSTOSCOPY WITH RETROGRADE PYELOGRAM;  left kidney washings;  Surgeon: Donnice Gwenyth nieves, MD;  Location[facility];  Service: Urology;  Laterality:  Left;   CYSTOSCOPY W/ RETROGRADES N/A 07/08/2015   Procedure: CYSTOSCOPY WITH RETROGRADE PYELOGRAM;  Surgeon: Donnice Brooks, MD;  Location: [facility];  Service: Urology;  Laterality: N/A;   CYSTOSCOPY W/ URETERAL STENT PLACEMENT Left 03/24/2019   Procedure: CYSTOSCOPY WITH RETROGRADE PYELOGRAM/URETERAL STENT PLACEMENT ureteroscopy biopsy of neoplasm;  Surgeon: Brooks Donnice, MD;  Location: [facility];  Service: Urology;  Laterality: Left;   CYSTOSCOPY W/ URETERAL STENT PLACEMENT Left 04/08/2019   Procedure: CYSTOSCOPY cystogram clot evacuation left ureteroscopy clot evacuation left stent exchange liimited transuretheral resectio of prastate and fulguration  bleeders of prostate;  Surgeon: Brooks Donnice, MD;  Location: [facility];  Service: Urology;  Laterality: Left;    CYSTOSCOPY WITH BIOPSY  08/22/2012   Procedure: CYSTOSCOPY WITH BIOPSY;  Surgeon: Donnice Gwenyth Brooks, MD;  Location: [facility];  Service: Urology;;   CYSTOSCOPY WITH BIOPSY N/A 11/08/2014   Procedure: CYSTOSCOPY/BIOPSPY BLADDER INSTILLATION OF MARCAINE  AND PYRIDIUM ;  Surgeon: Donnice Brooks, MD;  Location: [facility];  Service: Urology;  Laterality: N/A;   CYSTOSCOPY WITH BIOPSY N/A 07/08/2015   Procedure: CYSTOSCOPY WITH BLADDER BIOPSY, FULGURATION, RETROGRADE PYLOGRAM AND RENAL WASHING;  Surgeon: Donnice Brooks, MD;  Location: [facility];  Service: Urology;  Laterality: N/A;   CYSTOSCOPY WITH RETROGRADE PYELOGRAM, URETEROSCOPY AND STENT PLACEMENT Bilateral 07/30/2014   Procedure: CYSTOSCOPY WITH BLADDER BX FULERGATION AND BILATERAL RETROGRADE PYELOGRAM,;  Surgeon: Donnice Brooks, MD;  [facility];  Service: Urology;  Laterality: Bilateral;   CYSTOSCOPY WITH STENT PLACEMENT Left 03/24/2018   Procedure: STENT PLACEMENT AND BIOPSY;  Surgeon: Brooks Donnice, MD;  Location: [facility];  Service: Urology;  Laterality: Left;   CYSTOSCOPY/RETROGRADE/URETEROSCOPY Bilateral 08/17/2013   Procedure: CYSTOSCOPY, BLADDER BIOPSY WITH BILATERAL RETROGRADE PYELOGRAM, LEFT URETEROSCOPY AND DILATION OF STRICTURE ;  Surgeon: Donnice Brooks, MD;  Location: [facility];  Service: Urology;  Laterality: Bilateral;   CYSTOSCOPY/RETROGRADE/URETEROSCOPY Left 03/24/2018   Procedure: CYSTOSCOPY/RETROGRADE/URETEROSCOPY;  Surgeon: Brooks Donnice, MD;  Location: [facility];  Service: Urology;  Laterality: Left;   HOT HEMOSTASIS N/A 01/16/2019   Procedure: HOT HEMOSTASIS (ARGON PLASMA COAGULATION/BICAP);  Surgeon: Rosalie Kitchens, MD;  Location: [facility];  Service: Endoscopy;  Laterality: N/A;   INSERTION OF MESH N/A 08/20/2020   Procedure: INSERTION OF MESH;  Surgeon: Rubin Calamity, MD;  Location: [facility] OR;  Service:  General;  Laterality: N/A;   LEFT ATRIAL APPENDAGE OCCLUSION N/A 01/09/2015   Procedure: LEFT ATRIAL APPENDAGE OCCLUSION;  Surgeon: Lynwood Rakers, MD;  [facility];  Service: Cardiovascular;  Laterality: N/A;   LEFT HEART CATH AND CORONARY ANGIOGRAPHY N/A 11/20/2021   Procedure: LEFT HEART CATH AND CORONARY ANGIOGRAPHY;  Surgeon: Wonda Sharper, MD;  Location[facility] INVASIVE CV LAB;  Service: Cardiovascular;  Laterality: N/A;   LUMBAR LAMINECTOMY/DECOMPRESSION MICRODISCECTOMY Left 12/03/2013   Procedure: Left Lumbar three-four diskectomy with Lumbar four laminectomy ;  Surgeon: Reyes Budge, MD;  [facility]: [facility];  Service: Neurosurgery;  Laterality: Left;  Left Lumbar three-four diskectomy with Lumbar four laminectomy    LYMPH NODE DISSECTION Bilateral 04/13/2019   Procedure: LYMPH NODE DISSECTION;  Surgeon: Alvaro Hummer, MD;  Location: [facility];  Service: Urology;  Laterality: Bilateral;   ORIF LEFT ANKLE FX  2005   POLYPECTOMY  01/16/2019   Procedure: POLYPECTOMY;  Surgeon: Rosalie Kitchens, MD;  [facility];  Service: Endoscopy;;   PROSTATE BIOPSY N/A 08/22/2012   Procedure: BIOPSY TRANSRECTAL ULTRASONIC PROSTATE (TUBP);  Surgeon: Donnice Gwenyth  Nieves, MD;  Location: [facility];  Service: Urology;  Laterality: N/A;   ROBOT ASSITED LAPAROSCOPIC NEPHROURETERECTOMY Left 04/13/2019   Procedure: XI ROBOT ASSITED LAPAROSCOPIC NEPHROURETERECTOMY;  Surgeon: Alvaro Hummer, MD;  Location: [facility];  Service: Urology;  Laterality: Left;  3 HRS   TEE WITHOUT CARDIOVERSION N/A 12/31/2014   Procedure: TRANSESOPHAGEAL ECHOCARDIOGRAM (TEE);  Surgeon: Vinie JAYSON Maxcy, MD;  Location: [facility] ENDOSCOPY;  Service: Cardiovascular;  Laterality: N/A;  ef 55-60%/  mild MR, TR, and PR/  mild LAE and RAE   TEE WITHOUT CARDIOVERSION N/A 04/01/2022   Procedure: TRANSESOPHAGEAL ECHOCARDIOGRAM;  Surgeon: Nancey Eulas BRAVO, MD;  [facility];  Service: Cardiovascular;   Laterality: N/A;   TRANSURETHRAL RESECTION OF BLADDER TUMOR  10/22/2011   Procedure: TRANSURETHRAL RESECTION OF BLADDER TUMOR (TURBT);  Surgeon: Donnice Gwenyth Nieves, MD;  Location: [facility];  Service: Urology;  Laterality: N/A;  TURBT, LEFT URETEROSCOPY, POSSIBLE URETERAL STENT    URETEROSCOPY  10/22/2011   Procedure: URETEROSCOPY;  Surgeon: Donnice Gwenyth Nieves, MD;  Location: [facility];  Service: Urology;  Laterality: Left;   WISDOM TOOTH EXTRACTION     XI ROBOTIC ASSISTED VENTRAL HERNIA N/A 08/20/2020   Procedure: ROBOTIC INCISIONAL HERNIA REPAIR WITH MESH;  Surgeon: Rubin Calamity, MD;  Location: [facility] OR;  Service: General;  Laterality: N/A;   Patient Active Problem List   Diagnosis Date Noted   Hypercoagulable state due to paroxysmal atrial fibrillation (HCC) 04/29/2022   S/P hernia repair 08/20/2020   Renal mass 04/13/2019   Gross hematuria 04/08/2019   Hyperlipidemia 12/14/2014   CAD (coronary artery disease), native coronary artery    Paroxysmal atrial fibrillation (HCC) 08/13/2014   Long term current use of anticoagulant therapy 08/13/2014   Obesity (BMI 30-39.9) 08/13/2014   Hypertensive heart disease    Sleep apnea    Bladder cancer (HCC)    Prostate cancer (HCC)    Lumbar stenosis with neurogenic claudication 12/03/2013   Upper airway cough syndrome 01/27/2013    PCP: Patrisha Jerrell PARAS, PA-C  REFERRING PROVIDER: Wonda Worth SQUIBB, PA   REFERRING DIAG: R10.9 (ICD-10-CM) - Unspecified abdominal pain  THERAPY DIAG:  Abnormal posture  Muscle weakness (generalized)  Other muscle spasm  Rationale for Evaluation and Treatment: Rehabilitation  ONSET DATE: 2021  SUBJECTIVE:                                                                                                                                                                                           SUBJECTIVE STATEMENT: 10# too heavy at home, using 5# for HEP every  other day, no  pain   PAIN:  Are you having pain? No NPRS scale: 0/10  Pain location: bil upper abdomen under rib cage  Pain type: sharp Pain description: intermittent   Aggravating factors: bending forward (upper Rt) Relieving factors: compression over abdomen with painful activities   PRECAUTIONS: Other: history of cancer  RED FLAGS: None   WEIGHT BEARING RESTRICTIONS: No  FALLS:  Has patient fallen in last 6 months? No  OCCUPATION: retired  ACTIVITY LEVEL : walking, stays busy outside   PLOF: Independent  PATIENT GOALS: decrease abdominal pain   PERTINENT HISTORY:  Bladder cancer, BPH, GERD, prostate cancer, a-fib, bil inguinal hernia repair, lumbar laminectomy/decompression 2015, pulmonary fibrosis Sexual abuse: No  BOWEL MOVEMENT: *Pt states that he normally does not have issues, but due to medication for pulmonary fibrosis he has diarrhea, but feels like he can control  URINATION: WNL  INTERCOURSE:  WNL   OBJECTIVE:  Note: Objective measures were completed at Evaluation unless otherwise noted.  10/12/23 PATIENT SURVEYS:   PFIQ-7: 48  COGNITION: Overall cognitive status: Within functional limits for tasks assessed     SENSATION: Light touch: Appears intact   FUNCTIONAL TESTS:  Squat: bil valgus knee collapse, Rt>Lt Single leg stance:  Rt: pelvic drop  Lt:  pelvic drop  Curl-up test: upper abdominal distortion   GAIT: Assistive device utilized: None Comments: some antalgic Lt steps after initial rise from chair, Lt trendelenburg   POSTURE: rounded shoulders, forward head, decreased lumbar lordosis, increased thoracic kyphosis, and posterior pelvic tilt   LUMBARAROM/PROM:  A/PROM A/PROM  Eval (% available)  Flexion 50  Extension 25, relief  Right lateral flexion 75  Left lateral flexion 75  Right rotation 75  Left rotation 75   (Blank rows = not tested)  PALPATION:   General: lumbar scar tissue restriction; tightness in bil  lumbar paraspinals and trigger points in Lt glutes  Pelvic Alignment: posterior pelvic tilt  Abdominal: midline abdominal scar tissue restriction; significant tightness throughout all 4 abdominal quadrants with pain directly inferior to bil rib cage; significant increase in sternocostal angle with decreased abdominal tone in upper abdominals resulting in doming  Diastasis recti abdominals: 2-3 finger widths through midline           TODAY'S TREATMENT:                                                                                                                              DATE:   11/29/23; Palloff x10 10# each cables Forward bent rows 2x10 10# cables Hooklying pull down med power cord 2x10 Hooklying med power cord unilateal opposite hand/knee 2x10 each Bosu: 2x10 step ups, x10 lateral weight shifts, x10 lateral step ups - min A for balance righting Lat pull down 2x10 (x10 25#, x10 40#)   11/23/23: Manual scar tissue mobility pt in supine with diaphragmatic breathing throughout cued, fascial release completed in all quadrants no pain and moderate release completed. Rt side most restricted  today so most of manual completed here X10 blue band diagonals standing with transverse abdominis activation  Blue band bil shoulder horizontal abduction 2x10 standing with transverse abdominis activation   Blue band palloffs 2x10 each Rotational palloffs 2x10 blue band 2x10 each Anti rotations 10# standing with cross body high/low 2x10 each 7# going to Lt upper due to chronic shoulder limitations Cross body 7# with opposite hip flexion x10 15# farmers carry 1000' each hand  PATIENT EDUCATION:  Education details: See above Person educated: Patient Education method: Explanation, Demonstration, Tactile cues, Verbal cues, and Handouts Education comprehension: verbalized understanding  HOME EXERCISE PROGRAM: [id number]  ASSESSMENT:  CLINICAL IMPRESSION: Patient is a 88 y.o. male who was seen  today for physical therapy  treatment for upper abdominal pain since 2021. Pt continues to report improvement, greatly less tension and pain. Pt tolerated session well denied pain and reports greatly improved comfort after manual at start of session to better tolerate core exercises. Pt does continued to benefit from verbal cues for improved transverse abdominis activation and breathing mechanics which help to decreased distortion considerably. . Progressing well to improved core strength with progression today with standing core strengthening to decreased rib angle and improve tension at upper abdomen. He will continue to benefit from skilled PT intervention in order to decrease pain, address impairments, and improve quality of life.     OBJECTIVE IMPAIRMENTS: decreased activity tolerance, decreased coordination, decreased endurance, decreased mobility, decreased ROM, decreased strength, increased fascial restrictions, increased muscle spasms, impaired flexibility, impaired tone, improper body mechanics, postural dysfunction, and pain.   ACTIVITY LIMITATIONS: carrying, lifting, bending, squatting, and transfers  PARTICIPATION LIMITATIONS: cleaning, laundry, community activity, and yard work  PERSONAL FACTORS: 3+ comorbidities: medical history are also affecting patient's functional outcome.   REHAB POTENTIAL: Good  CLINICAL DECISION MAKING: Evolving/moderate complexity  EVALUATION COMPLEXITY: Moderate   GOALS: Goals reviewed with patient? Yes  SHORT TERM GOALS: Target date: 11/09/2023 *all goals updated 11/15/23  Pt will be independent with HEP in order to improve activity tolerance.   Baseline: only walking Goal status: MET  2.  Patient will report 25% improve in abdominal pain in order to increase activity tolerance.   Baseline: 7/10 Goal status: MET  3.  Pt will improve upper abdominal tone in order to decrease sternocostal angle, improve activity tolerance, and decrease pain.    Baseline:  Goal status: MET  4.  Pt will increase all impaired lumbar A/ROM by 25% without pain in order to improve abdominal pain.   Baseline: most decrease in flexion and extension Goal status: MET    LONG TERM GOALS: Target date: 01/04/2024   Pt will be independent with advanced HEP in order to improve activity tolerance.   Baseline:  Goal status: on going  2.  Patient will report 75% improve in abdominal pain in order to increase activity tolerance.   Baseline: 7/10 Goal status: MET  3.  Patient will be able to perform single leg stance with good pelvic stability in order to demonstrate appropriate pelvic floor muscle and transversus abdominus strength and coordination in order to decrease abdominal pain and reduce risk of falls.   Baseline: pelvic drop bil Goal status:on going  4.  Pt will demonstrate appropriate squat form in order to show improved bil functional hip strength and reduce risk of falls and improve pelvic support to decrease pain.  Baseline: bil valgus knee collapse, Rt>Lt Goal status: on going  5.  Pt will decrease PFIQ-7 score to less than  20 in order to demonstrate improved functional ability and activity tolerance.  Baseline: 48 Goal status: on going   PLAN:  PT FREQUENCY: 1-2x/week  PT DURATION: 10 visits    PLANNED INTERVENTIONS: 97164- PT Re-evaluation, 97110-Therapeutic exercises, 97530- Therapeutic activity, 97112- Neuromuscular re-education, 97535- Self Care, 02859- Manual therapy, [phone]- Gait training, [phone]- Aquatic Therapy, [phone]- Electrical stimulation (unattended), [phone]- Traction (mechanical), [id number]- Ionotophoresis 4mg /ml Dexamethasone , 20560 (1-2 muscles), 20561 (3+ muscles)- Dry Needling, Patient/Family education, Balance training, Taping, Joint mobilization, Joint manipulation, Spinal manipulation, Spinal mobilization, Scar mobilization, Vestibular training, Cryotherapy, Moist heat, and Biofeedback  PLAN FOR NEXT SESSION: manual  techniques to lower and upper abdomen to help improve scar tissue restriction; mobility activities; core and hip strengthening  Darryle Navy, PT, DPT 10/21/253:29 PM  [facility] [address] [place], [place] [address] Phone # [phone] Fax [phone]

## 2023-12-02 ENCOUNTER — Other Ambulatory Visit: Payer: Self-pay | Admitting: Emergency Medicine

## 2023-12-02 DIAGNOSIS — I480 Paroxysmal atrial fibrillation: Secondary | ICD-10-CM | POA: Diagnosis not present

## 2023-12-02 DIAGNOSIS — I2510 Atherosclerotic heart disease of native coronary artery without angina pectoris: Secondary | ICD-10-CM | POA: Diagnosis not present

## 2023-12-02 DIAGNOSIS — I10 Essential (primary) hypertension: Secondary | ICD-10-CM | POA: Diagnosis not present

## 2023-12-02 DIAGNOSIS — H5310 Unspecified subjective visual disturbances: Secondary | ICD-10-CM | POA: Diagnosis not present

## 2023-12-02 DIAGNOSIS — G459 Transient cerebral ischemic attack, unspecified: Secondary | ICD-10-CM

## 2023-12-02 NOTE — Progress Notes (Unsigned)
 DOD call received from Dr McCuen to discuss this patient.   Orders received from Dr Francyne for Carotid doppler for TIA. Order placed. The patient will receive a call to get this scheduled.

## 2023-12-06 ENCOUNTER — Ambulatory Visit (HOSPITAL_COMMUNITY)
Admission: RE | Admit: 2023-12-06 | Discharge: 2023-12-06 | Disposition: A | Discharge status: Home / Self Care | Source: Ambulatory Visit | Attending: Cardiovascular Disease | Admitting: Cardiovascular Disease

## 2023-12-06 DIAGNOSIS — G459 Transient cerebral ischemic attack, unspecified: Secondary | ICD-10-CM | POA: Insufficient documentation

## 2023-12-07 DIAGNOSIS — M542 Cervicalgia: Secondary | ICD-10-CM | POA: Diagnosis not present

## 2023-12-08 ENCOUNTER — Ambulatory Visit: Payer: Self-pay | Admitting: Cardiovascular Disease

## 2023-12-08 ENCOUNTER — Ambulatory Visit: Admitting: Physical Therapy

## 2023-12-08 DIAGNOSIS — M5459 Other low back pain: Secondary | ICD-10-CM | POA: Diagnosis not present

## 2023-12-08 DIAGNOSIS — R293 Abnormal posture: Secondary | ICD-10-CM

## 2023-12-08 DIAGNOSIS — M6281 Muscle weakness (generalized): Secondary | ICD-10-CM | POA: Diagnosis not present

## 2023-12-08 DIAGNOSIS — M62838 Other muscle spasm: Secondary | ICD-10-CM | POA: Diagnosis not present

## 2023-12-08 NOTE — Therapy (Signed)
 OUTPATIENT PHYSICAL THERAPY Male PELVIC TREATMENT   Patient Name: Charles Wright MRN: [id number] DOB:1935-05-28, 88 y.o., male Today's Date: 12/08/2023  END OF SESSION:  PT End of Session - 12/08/23 1451     Visit Number 9    Date for Recertification  01/04/24    Authorization Type BCBS Medicare    Progress Note Due on Visit 10    PT Start Time 1448    PT Stop Time 1527    PT Time Calculation (min) 39 min    Activity Tolerance Patient tolerated treatment well    Behavior During Therapy [facility] for tasks assessed/performed           Past Medical History:  Diagnosis Date   Acute gout of left ankle    06-14-2015   Bladder cancer (HCC)    BCG's tx's   BPH (benign prostatic hypertrophy)    CAD (coronary artery disease) CARDIOLOGIST-  DR TILLEY   a. NSTEMI 12/2014 -  99% dLAD-2 (not amenable to PCI), 50% dLAD-1, 60% mLAD. Medical therapy was recommended.   Cancer of kidney (HCC)    left   Cough    HARSH NONPRODUCTIVE COUGH 1 AND 1/2 WEEKS   GERD (gastroesophageal reflux disease)    takes  OTC periodically   Gilbert's syndrome    H/O cardiac catheterization    a. Note: Difficult radial access in 12/2014 - recommend femoral approach if cath needed in the future.   History of kidney stones    History of non-ST elevation myocardial infarction (NSTEMI)    12-12-2014   CARDIAC CATH W/ NO INTERVENTION X 2FEW DAYS APART   History of spinal fracture 2002   LUMBAR   Hyperlipidemia    Hypertension    Hypothyroidism    Myocardial infarction (HCC)    OSA on CPAP    SET ON 7 USES SOME NIGHTS   Paroxysmal A-fib [facility])    cardiologist-  dr blanca   Prostate cancer [facility]) 2019   last PSA  2.9  (montiored by urologist  dr nieves) [place] AND FEB 2019 RAD DONE   Shingles    2 weeks ago   Wears glasses    Past Surgical History:  Procedure Laterality Date   ATRIAL FIBRILLATION ABLATION N/A 04/01/2022   Procedure: ATRIAL FIBRILLATION ABLATION;  Surgeon: Nancey Eulas BRAVO, MD;  [facility]:  [facility] INVASIVE CV LAB;  Service: Cardiovascular;  Laterality: N/A;   BACK SURGERY  2014   LOWER l 1, 2 4 AND 5   BILATERAL INGUINAL HERNIA REPAIR     CARDIAC CATHETERIZATION N/A 12/13/2014   Procedure: Left Heart Cath and Coronary Angiography;  Surgeon: Alm LELON Clay, MD;  Location: [facility] INVASIVE CV LAB;  Service: Cardiovascular;  Laterality: N/A;  culprit lesion diat LAD-2  99%, not approachable via PCTA  due to extremely tortuous up stream LAD/  mLAD 60% and dLAD-1 50%, both are at the extremely tortuous segment and not PCI targets/  otherwise normal coronary arteries   CARDIOVERSION N/A 08/09/2022   Procedure: CARDIOVERSION;  Surgeon: Okey Vina GAILS, MD;  Location: [facility] INVASIVE CV LAB;  Service: Cardiovascular;  Laterality: N/A;   COLONOSCOPY WITH PROPOFOL  N/A 01/16/2019   Procedure: COLONOSCOPY WITH PROPOFOL ;  Surgeon: Rosalie Kitchens, MD;  Location: [facility];  Service: Endoscopy;  Laterality: N/A;   CYSTOSCOPY W/ RETROGRADES Left 08/22/2012   Procedure: CYSTOSCOPY WITH RETROGRADE PYELOGRAM;  left kidney washings;  Surgeon: Donnice Gwenyth Nieves, MD;  Location: [facility];  Service: Urology;  Laterality:  Left;   CYSTOSCOPY W/ RETROGRADES N/A 07/08/2015   Procedure: CYSTOSCOPY WITH RETROGRADE PYELOGRAM;  Surgeon: Donnice Brooks, MD;  Location: [facility];  Service: Urology;  Laterality: N/A;   CYSTOSCOPY W/ URETERAL STENT PLACEMENT Left 03/24/2019   Procedure: CYSTOSCOPY WITH RETROGRADE PYELOGRAM/URETERAL STENT PLACEMENT ureteroscopy biopsy of neoplasm;  Surgeon: Brooks Donnice, MD;  Location: [facility];  Service: Urology;  Laterality: Left;   CYSTOSCOPY W/ URETERAL STENT PLACEMENT Left 04/08/2019   Procedure: CYSTOSCOPY cystogram clot evacuation left ureteroscopy clot evacuation left stent exchange liimited transuretheral resectio of prastate and fulguration  bleeders of prostate;  Surgeon: Brooks Donnice, MD;  Location: [facility];  Service: Urology;  Laterality: Left;    CYSTOSCOPY WITH BIOPSY  08/22/2012   Procedure: CYSTOSCOPY WITH BIOPSY;  Surgeon: Donnice Gwenyth Brooks, MD;  Location: [facility];  Service: Urology;;   CYSTOSCOPY WITH BIOPSY N/A 11/08/2014   Procedure: CYSTOSCOPY/BIOPSPY BLADDER INSTILLATION OF MARCAINE  AND PYRIDIUM ;  Surgeon: Donnice Brooks, MD;  Location: [facility];  Service: Urology;  Laterality: N/A;   CYSTOSCOPY WITH BIOPSY N/A 07/08/2015   Procedure: CYSTOSCOPY WITH BLADDER BIOPSY, FULGURATION, RETROGRADE PYLOGRAM AND RENAL WASHING;  Surgeon: Donnice Brooks, MD;  Location: [facility];  Service: Urology;  Laterality: N/A;   CYSTOSCOPY WITH RETROGRADE PYELOGRAM, URETEROSCOPY AND STENT PLACEMENT Bilateral 07/30/2014   Procedure: CYSTOSCOPY WITH BLADDER BX FULERGATION AND BILATERAL RETROGRADE PYELOGRAM,;  Surgeon: Donnice Brooks, MD;  Location: [facility];  Service: Urology;  Laterality: Bilateral;   CYSTOSCOPY WITH STENT PLACEMENT Left 03/24/2018   Procedure: STENT PLACEMENT AND BIOPSY;  Surgeon: Brooks Donnice, MD;  Location: [facility];  Service: Urology;  Laterality: Left;   CYSTOSCOPY/RETROGRADE/URETEROSCOPY Bilateral 08/17/2013   Procedure: CYSTOSCOPY, BLADDER BIOPSY WITH BILATERAL RETROGRADE PYELOGRAM, LEFT URETEROSCOPY AND DILATION OF STRICTURE ;  Surgeon: Donnice Brooks, MD;  Location: [facility];  Service: Urology;  Laterality: Bilateral;   CYSTOSCOPY/RETROGRADE/URETEROSCOPY Left 03/24/2018   Procedure: CYSTOSCOPY/RETROGRADE/URETEROSCOPY;  Surgeon: Brooks Donnice, MD;  Location: [facility];  Service: Urology;  Laterality: Left;   HOT HEMOSTASIS N/A 01/16/2019   Procedure: HOT HEMOSTASIS (ARGON PLASMA COAGULATION/BICAP);  Surgeon: Rosalie Kitchens, MD;  Location: [facility];  Service: Endoscopy;  Laterality: N/A;   INSERTION OF MESH N/A 08/20/2020   Procedure: INSERTION OF MESH;  Surgeon: Rubin Calamity, MD;  Location: [facility] OR;  Service:  General;  Laterality: N/A;   LEFT ATRIAL APPENDAGE OCCLUSION N/A 01/09/2015   Procedure: LEFT ATRIAL APPENDAGE OCCLUSION;  Surgeon: Lynwood Rakers, MD;  [facility];  Service: Cardiovascular;  Laterality: N/A;   LEFT HEART CATH AND CORONARY ANGIOGRAPHY N/A 11/20/2021   Procedure: LEFT HEART CATH AND CORONARY ANGIOGRAPHY;  Surgeon: Wonda Sharper, MD;  Location: [facility] INVASIVE CV LAB;  Service: Cardiovascular;  Laterality: N/A;   LUMBAR LAMINECTOMY/DECOMPRESSION MICRODISCECTOMY Left 12/03/2013   Procedure: Left Lumbar three-four diskectomy with Lumbar four laminectomy ;  Surgeon: Reyes Budge, MD;  [facility];  Service: Neurosurgery;  Laterality: Left;  Left Lumbar three-four diskectomy with Lumbar four laminectomy    LYMPH NODE DISSECTION Bilateral 04/13/2019   Procedure: LYMPH NODE DISSECTION;  Surgeon: Alvaro Hummer, MD;  Location: [facility];  Service: Urology;  Laterality: Bilateral;   ORIF LEFT ANKLE FX  2005   POLYPECTOMY  01/16/2019   Procedure: POLYPECTOMY;  Surgeon: Rosalie Kitchens, MD;  Location: [facility];  Service: Endoscopy;;   PROSTATE BIOPSY N/A 08/22/2012   Procedure: BIOPSY TRANSRECTAL ULTRASONIC PROSTATE (TUBP);  Surgeon: Donnice Gwenyth  Nieves, MD;  Location: [facility];  Service: Urology;  Laterality: N/A;   ROBOT ASSITED LAPAROSCOPIC NEPHROURETERECTOMY Left 04/13/2019   Procedure: XI ROBOT ASSITED LAPAROSCOPIC NEPHROURETERECTOMY;  Surgeon: Alvaro Hummer, MD;  Location: [facility];  Service: Urology;  Laterality: Left;  3 HRS   TEE WITHOUT CARDIOVERSION N/A 12/31/2014   Procedure: TRANSESOPHAGEAL ECHOCARDIOGRAM (TEE);  Surgeon: Vinie JAYSON Maxcy, MD;  Location: [facility] ENDOSCOPY;  Service: Cardiovascular;  Laterality: N/A;  ef 55-60%/  mild MR, TR, and PR/  mild LAE and RAE   TEE WITHOUT CARDIOVERSION N/A 04/01/2022   Procedure: TRANSESOPHAGEAL ECHOCARDIOGRAM;  Surgeon: Nancey Eulas BRAVO, MD;  [facility];  Service: Cardiovascular;   Laterality: N/A;   TRANSURETHRAL RESECTION OF BLADDER TUMOR  10/22/2011   Procedure: TRANSURETHRAL RESECTION OF BLADDER TUMOR (TURBT);  Surgeon: Donnice Gwenyth Nieves, MD;  Location: [facility];  Service: Urology;  Laterality: N/A;  TURBT, LEFT URETEROSCOPY, POSSIBLE URETERAL STENT    URETEROSCOPY  10/22/2011   Procedure: URETEROSCOPY;  Surgeon: Donnice Gwenyth Nieves, MD;  Location: [facility];  Service: Urology;  Laterality: Left;   WISDOM TOOTH EXTRACTION     XI ROBOTIC ASSISTED VENTRAL HERNIA N/A 08/20/2020   Procedure: ROBOTIC INCISIONAL HERNIA REPAIR WITH MESH;  Surgeon: Rubin Calamity, MD;  Location: [facility] OR;  Service: General;  Laterality: N/A;   Patient Active Problem List   Diagnosis Date Noted   Hypercoagulable state due to paroxysmal atrial fibrillation (HCC) 04/29/2022   S/P hernia repair 08/20/2020   Renal mass 04/13/2019   Gross hematuria 04/08/2019   Hyperlipidemia 12/14/2014   CAD (coronary artery disease), native coronary artery    Paroxysmal atrial fibrillation (HCC) 08/13/2014   Long term current use of anticoagulant therapy 08/13/2014   Obesity (BMI 30-39.9) 08/13/2014   Hypertensive heart disease    Sleep apnea    Bladder cancer (HCC)    Prostate cancer (HCC)    Lumbar stenosis with neurogenic claudication 12/03/2013   Upper airway cough syndrome 01/27/2013    PCP: Patrisha Jerrell PARAS, PA-C  REFERRING PROVIDER: Wonda Worth SQUIBB, PA   REFERRING DIAG: R10.9 (ICD-10-CM) - Unspecified abdominal pain  THERAPY DIAG:  Abnormal posture  Muscle weakness (generalized)  Rationale for Evaluation and Treatment: Rehabilitation  ONSET DATE: 2021  SUBJECTIVE:                                                                                                                                                                                           SUBJECTIVE STATEMENT: Feeling good this week, abdomen hasn't bothered him   PAIN:  Are  you having pain? No  NPRS scale: 0/10  Pain location: bil upper abdomen under rib cage  Pain type: sharp Pain description: intermittent   Aggravating factors: bending forward (upper Rt) Relieving factors: compression over abdomen with painful activities   PRECAUTIONS: Other: history of cancer  RED FLAGS: None   WEIGHT BEARING RESTRICTIONS: No  FALLS:  Has patient fallen in last 6 months? No  OCCUPATION: retired  ACTIVITY LEVEL : walking, stays busy outside   PLOF: Independent  PATIENT GOALS: decrease abdominal pain   PERTINENT HISTORY:  Bladder cancer, BPH, GERD, prostate cancer, a-fib, bil inguinal hernia repair, lumbar laminectomy/decompression 2015, pulmonary fibrosis Sexual abuse: No  BOWEL MOVEMENT: *Pt states that he normally does not have issues, but due to medication for pulmonary fibrosis he has diarrhea, but feels like he can control  URINATION: WNL  INTERCOURSE:  WNL   OBJECTIVE:  Note: Objective measures were completed at Evaluation unless otherwise noted.  10/12/23 PATIENT SURVEYS:   PFIQ-7: 48  COGNITION: Overall cognitive status: Within functional limits for tasks assessed     SENSATION: Light touch: Appears intact   FUNCTIONAL TESTS:  Squat: bil valgus knee collapse, Rt>Lt Single leg stance:  Rt: pelvic drop  Lt:  pelvic drop  Curl-up test: upper abdominal distortion   GAIT: Assistive device utilized: None Comments: some antalgic Lt steps after initial rise from chair, Lt trendelenburg   POSTURE: rounded shoulders, forward head, decreased lumbar lordosis, increased thoracic kyphosis, and posterior pelvic tilt   LUMBARAROM/PROM:  A/PROM A/PROM  Eval (% available)  Flexion 50  Extension 25, relief  Right lateral flexion 75  Left lateral flexion 75  Right rotation 75  Left rotation 75   (Blank rows = not tested)  PALPATION:   General: lumbar scar tissue restriction; tightness in bil lumbar paraspinals and trigger points in  Lt glutes  Pelvic Alignment: posterior pelvic tilt  Abdominal: midline abdominal scar tissue restriction; significant tightness throughout all 4 abdominal quadrants with pain directly inferior to bil rib cage; significant increase in sternocostal angle with decreased abdominal tone in upper abdominals resulting in doming  Diastasis recti abdominals: 2-3 finger widths through midline           TODAY'S TREATMENT:                                                                                                                              DATE:   12/08/23: Tricep pull down 20# x25 with transverse abdominis activation  Palloff 10# x20 each Seated cross body diagonals x20 each 8#  Squats 8# 2x10 Farmer's carry 15# 1000' each side Seated ball squeeze in hands with exhale and transverse abdominis activation 2x10 Red loop lateral stepping 50' x2 each way   11/29/23; Palloff x10 10# each cables Forward bent rows 2x10 10# cables Hooklying pull down med power cord 2x10 Hooklying med power cord unilateal opposite hand/knee 2x10 each Bosu: 2x10 step ups, x10 lateral weight shifts, x10 lateral step ups -  min A for balance righting Lat pull down 2x10 (x10 25#, x10 40#)   11/23/23: Manual scar tissue mobility pt in supine with diaphragmatic breathing throughout cued, fascial release completed in all quadrants no pain and moderate release completed. Rt side most restricted today so most of manual completed here X10 blue band diagonals standing with transverse abdominis activation  Blue band bil shoulder horizontal abduction 2x10 standing with transverse abdominis activation   Blue band palloffs 2x10 each Rotational palloffs 2x10 blue band 2x10 each Anti rotations 10# standing with cross body high/low 2x10 each 7# going to Lt upper due to chronic shoulder limitations Cross body 7# with opposite hip flexion x10 15# farmers carry 1000' each hand  PATIENT EDUCATION:  Education details: See  above Person educated: Patient Education method: Explanation, Demonstration, Tactile cues, Verbal cues, and Handouts Education comprehension: verbalized understanding  HOME EXERCISE PROGRAM: [id number]  ASSESSMENT:  CLINICAL IMPRESSION: Patient is a 88 y.o. male who was seen today for physical therapy  treatment for upper abdominal pain since 2021. Pt reports no tension in abdomen felt, reports he has had a good week. Tolerated exercising well with rest break x2 briefly, cues for breathing mechanics, and transverse abdominis activation intermittently. Progressed session today with no massage needed, and more standing exercises today.  He will continue to benefit from skilled PT intervention in order to decrease pain, address impairments, and improve quality of life.     OBJECTIVE IMPAIRMENTS: decreased activity tolerance, decreased coordination, decreased endurance, decreased mobility, decreased ROM, decreased strength, increased fascial restrictions, increased muscle spasms, impaired flexibility, impaired tone, improper body mechanics, postural dysfunction, and pain.   ACTIVITY LIMITATIONS: carrying, lifting, bending, squatting, and transfers  PARTICIPATION LIMITATIONS: cleaning, laundry, community activity, and yard work  PERSONAL FACTORS: 3+ comorbidities: medical history are also affecting patient's functional outcome.   REHAB POTENTIAL: Good  CLINICAL DECISION MAKING: Evolving/moderate complexity  EVALUATION COMPLEXITY: Moderate   GOALS: Goals reviewed with patient? Yes  SHORT TERM GOALS: Target date: 11/09/2023 *all goals updated 11/15/23  Pt will be independent with HEP in order to improve activity tolerance.   Baseline: only walking Goal status: MET  2.  Patient will report 25% improve in abdominal pain in order to increase activity tolerance.   Baseline: 7/10 Goal status: MET  3.  Pt will improve upper abdominal tone in order to decrease sternocostal angle, improve  activity tolerance, and decrease pain.   Baseline:  Goal status: MET  4.  Pt will increase all impaired lumbar A/ROM by 25% without pain in order to improve abdominal pain.   Baseline: most decrease in flexion and extension Goal status: MET    LONG TERM GOALS: Target date: 01/04/2024   Pt will be independent with advanced HEP in order to improve activity tolerance.   Baseline:  Goal status: on going  2.  Patient will report 75% improve in abdominal pain in order to increase activity tolerance.   Baseline: 7/10 Goal status: MET  3.  Patient will be able to perform single leg stance with good pelvic stability in order to demonstrate appropriate pelvic floor muscle and transversus abdominus strength and coordination in order to decrease abdominal pain and reduce risk of falls.   Baseline: pelvic drop bil Goal status:on going  4.  Pt will demonstrate appropriate squat form in order to show improved bil functional hip strength and reduce risk of falls and improve pelvic support to decrease pain.  Baseline: bil valgus knee collapse, Rt>Lt Goal status: on going  5.  Pt will decrease PFIQ-7 score to less than 20 in order to demonstrate improved functional ability and activity tolerance.  Baseline: 48 Goal status: on going   PLAN:  PT FREQUENCY: 1-2x/week  PT DURATION: 10 visits    PLANNED INTERVENTIONS: 97164- PT Re-evaluation, 97110-Therapeutic exercises, 97530- Therapeutic activity, 97112- Neuromuscular re-education, 97535- Self Care, [id number]- Manual therapy, [phone]- Gait training, [phone]- Aquatic Therapy, [phone]- Electrical stimulation (unattended), [phone]- Traction (mechanical), [id number]- Ionotophoresis 4mg /ml Dexamethasone , 79439 (1-2 muscles), [id number] (3+ muscles)- Dry Needling, Patient/Family education, Balance training, Taping, Joint mobilization, Joint manipulation, Spinal manipulation, Spinal mobilization, Scar mobilization, Vestibular training, Cryotherapy, Moist heat, and  Biofeedback  PLAN FOR NEXT SESSION: manual techniques to lower and upper abdomen to help improve scar tissue restriction; mobility activities; core and hip strengthening  Darryle Navy, PT, DPT 10/30/254:12 PM  [facility] [address] [place], [place] [address] Phone # [phone] Fax [phone]

## 2023-12-12 DIAGNOSIS — J849 Interstitial pulmonary disease, unspecified: Secondary | ICD-10-CM | POA: Diagnosis not present

## 2023-12-15 ENCOUNTER — Ambulatory Visit: Attending: Physician Assistant

## 2023-12-15 NOTE — Therapy (Incomplete)
 OUTPATIENT PHYSICAL THERAPY Male PELVIC TREATMENT   Patient Name: Charles Wright MRN: [id number] DOB:02-07-1936, 88 y.o., male Today's Date: 12/15/2023  END OF SESSION:     Past Medical History:  Diagnosis Date   Acute gout of left ankle    06-14-2015   Bladder cancer (HCC)    BCG's tx's   BPH (benign prostatic hypertrophy)    CAD (coronary artery disease) CARDIOLOGIST-  DR TILLEY   a. NSTEMI 12/2014 -  99% dLAD-2 (not amenable to PCI), 50% dLAD-1, 60% mLAD. Medical therapy was recommended.   Cancer of kidney (HCC)    left   Cough    HARSH NONPRODUCTIVE COUGH 1 AND 1/2 WEEKS   GERD (gastroesophageal reflux disease)    takes  OTC periodically   Gilbert's syndrome    H/O cardiac catheterization    a. Note: Difficult radial access in 12/2014 - recommend femoral approach if cath needed in the future.   History of kidney stones    History of non-ST elevation myocardial infarction (NSTEMI)    12-12-2014   CARDIAC CATH W/ NO INTERVENTION X 2FEW DAYS APART   History of spinal fracture 2002   LUMBAR   Hyperlipidemia    Hypertension    Hypothyroidism    Myocardial infarction (HCC)    OSA on CPAP    SET ON 7 USES SOME NIGHTS   Paroxysmal A-fib [facility])    cardiologist-  dr blanca   Prostate cancer [facility]) 2019   last PSA  2.9  (montiored by urologist  dr nieves) [place] AND FEB 2019 RAD DONE   Shingles    2 weeks ago   Wears glasses    Past Surgical History:  Procedure Laterality Date   ATRIAL FIBRILLATION ABLATION N/A 04/01/2022   Procedure: ATRIAL FIBRILLATION ABLATION;  Surgeon: Nancey Eulas BRAVO, MD;  Location: [facility] INVASIVE CV LAB;  Service: Cardiovascular;  Laterality: N/A;   BACK SURGERY  2014   LOWER l 1, 2 4 AND 5   BILATERAL INGUINAL HERNIA REPAIR     CARDIAC CATHETERIZATION N/A 12/13/2014   Procedure: Left Heart Cath and Coronary Angiography;  Surgeon: Alm LELON Clay, MD;  Location: [facility] INVASIVE CV LAB;  Service: Cardiovascular;  Laterality: N/A;  culprit lesion diat  LAD-2  99%, not approachable via PCTA  due to extremely tortuous up stream LAD/  mLAD 60% and dLAD-1 50%, both are at the extremely tortuous segment and not PCI targets/  otherwise normal coronary arteries   CARDIOVERSION N/A 08/09/2022   Procedure: CARDIOVERSION;  Surgeon: Okey Vina GAILS, MD;  Location: [facility] INVASIVE CV LAB;  Service: Cardiovascular;  Laterality: N/A;   COLONOSCOPY WITH PROPOFOL  N/A 01/16/2019   Procedure: COLONOSCOPY WITH PROPOFOL ;  Surgeon: Rosalie Kitchens, MD;  Location: [facility];  Service: Endoscopy;  Laterality: N/A;   CYSTOSCOPY W/ RETROGRADES Left 08/22/2012   Procedure: CYSTOSCOPY WITH RETROGRADE PYELOGRAM;  left kidney washings;  Surgeon: Donnice Gwenyth Nieves, MD;  Location: [facility];  Service: Urology;  Laterality: Left;   CYSTOSCOPY W/ RETROGRADES N/A 07/08/2015   Procedure: CYSTOSCOPY WITH RETROGRADE PYELOGRAM;  Surgeon: Donnice Nieves, MD;  Location: [facility];  Service: Urology;  Laterality: N/A;   CYSTOSCOPY W/ URETERAL STENT PLACEMENT Left 03/24/2019   Procedure: CYSTOSCOPY WITH RETROGRADE PYELOGRAM/URETERAL STENT PLACEMENT ureteroscopy biopsy of neoplasm;  Surgeon: Nieves Donnice, MD;  Location: [facility];  Service: Urology;  Laterality: Left;   CYSTOSCOPY W/ URETERAL STENT PLACEMENT Left 04/08/2019   Procedure: CYSTOSCOPY cystogram clot evacuation left ureteroscopy  clot evacuation left stent exchange liimited transuretheral resectio of prastate and fulguration  bleeders of prostate;  Surgeon: Nieves Cough, MD;  Location: [facility];  Service: Urology;  Laterality: Left;   CYSTOSCOPY WITH BIOPSY  08/22/2012   Procedure: CYSTOSCOPY WITH BIOPSY;  Surgeon: Cough Gwenyth Nieves, MD;  Location: [facility];  Service: Urology;;   CYSTOSCOPY WITH BIOPSY N/A 11/08/2014   Procedure: CYSTOSCOPY/BIOPSPY BLADDER INSTILLATION OF MARCAINE  AND PYRIDIUM ;  Surgeon: Cough Nieves, MD;  Location: [facility];  Service:  Urology;  Laterality: N/A;   CYSTOSCOPY WITH BIOPSY N/A 07/08/2015   Procedure: CYSTOSCOPY WITH BLADDER BIOPSY, FULGURATION, RETROGRADE PYLOGRAM AND RENAL WASHING;  Surgeon: Cough Nieves, MD;  Location: [facility];  Service: Urology;  Laterality: N/A;   CYSTOSCOPY WITH RETROGRADE PYELOGRAM, URETEROSCOPY AND STENT PLACEMENT Bilateral 07/30/2014   Procedure: CYSTOSCOPY WITH BLADDER BX FULERGATION AND BILATERAL RETROGRADE PYELOGRAM,;  Surgeon: Cough Nieves, MD;  Location: [facility];  Service: Urology;  Laterality: Bilateral;   CYSTOSCOPY WITH STENT PLACEMENT Left 03/24/2018   Procedure: STENT PLACEMENT AND BIOPSY;  Surgeon: Nieves Cough, MD;  [facility];  Service: Urology;  Laterality: Left;   CYSTOSCOPY/RETROGRADE/URETEROSCOPY Bilateral 08/17/2013   Procedure: CYSTOSCOPY, BLADDER BIOPSY WITH BILATERAL RETROGRADE PYELOGRAM, LEFT URETEROSCOPY AND DILATION OF STRICTURE ;  Surgeon: Cough Nieves, MD;  Location: [facility];  Service: Urology;  Laterality: Bilateral;   CYSTOSCOPY/RETROGRADE/URETEROSCOPY Left 03/24/2018   Procedure: CYSTOSCOPY/RETROGRADE/URETEROSCOPY;  Surgeon: Nieves Cough, MD;  Location: [facility];  Service: Urology;  Laterality: Left;   HOT HEMOSTASIS N/A 01/16/2019   Procedure: HOT HEMOSTASIS (ARGON PLASMA COAGULATION/BICAP);  Surgeon: Rosalie Kitchens, MD;  Location: [facility];  Service: Endoscopy;  Laterality: N/A;   INSERTION OF MESH N/A 08/20/2020   Procedure: INSERTION OF MESH;  Surgeon: Rubin Calamity, MD;  Location: [facility];  Service: General;  Laterality: N/A;   LEFT ATRIAL APPENDAGE OCCLUSION N/A 01/09/2015   Procedure: LEFT ATRIAL APPENDAGE OCCLUSION;  Surgeon: Lynwood Rakers, MD;  [facility]: [facility];  Service: Cardiovascular;  Laterality: N/A;   LEFT HEART CATH AND CORONARY ANGIOGRAPHY N/A 11/20/2021   Procedure: LEFT HEART CATH AND CORONARY ANGIOGRAPHY;  Surgeon: Wonda Sharper, MD;   Location: [facility] INVASIVE CV LAB;  Service: Cardiovascular;  Laterality: N/A;   LUMBAR LAMINECTOMY/DECOMPRESSION MICRODISCECTOMY Left 12/03/2013   Procedure: Left Lumbar three-four diskectomy with Lumbar four laminectomy ;  Surgeon: Reyes Budge, MD;  Location: [facility];  Service: Neurosurgery;  Laterality: Left;  Left Lumbar three-four diskectomy with Lumbar four laminectomy    LYMPH NODE DISSECTION Bilateral 04/13/2019   Procedure: LYMPH NODE DISSECTION;  Surgeon: Alvaro Hummer, MD;  Location: [facility];  Service: Urology;  Laterality: Bilateral;   ORIF LEFT ANKLE FX  2005   POLYPECTOMY  01/16/2019   Procedure: POLYPECTOMY;  Surgeon: Rosalie Kitchens, MD;  Location: [facility];  Service: Endoscopy;;   PROSTATE BIOPSY N/A 08/22/2012   Procedure: BIOPSY TRANSRECTAL ULTRASONIC PROSTATE (TUBP);  Surgeon: Cough Gwenyth Nieves, MD;  Location: [facility];  Service: Urology;  Laterality: N/A;   ROBOT ASSITED LAPAROSCOPIC NEPHROURETERECTOMY Left 04/13/2019   Procedure: XI ROBOT ASSITED LAPAROSCOPIC NEPHROURETERECTOMY;  Surgeon: Alvaro Hummer, MD;  Location: [facility];  Service: Urology;  Laterality: Left;  3 HRS   TEE WITHOUT CARDIOVERSION N/A 12/31/2014   Procedure: TRANSESOPHAGEAL ECHOCARDIOGRAM (TEE);  Surgeon: Vinie JAYSON Maxcy, MD;  Location: [facility];  Service: Cardiovascular;  Laterality: N/A;  ef 55-60%/  mild MR, TR, and PR/  mild LAE and RAE   TEE WITHOUT CARDIOVERSION N/A 04/01/2022   Procedure: TRANSESOPHAGEAL ECHOCARDIOGRAM;  Surgeon: Nancey Eulas BRAVO, MD;  Location: [facility];  Service: Cardiovascular;  Laterality: N/A;   TRANSURETHRAL RESECTION OF BLADDER TUMOR  10/22/2011   Procedure: TRANSURETHRAL RESECTION OF BLADDER TUMOR (TURBT);  Surgeon: Donnice Gwenyth Brooks, MD;  Location: [facility];  Service: Urology;  Laterality: N/A;  TURBT, LEFT URETEROSCOPY, POSSIBLE URETERAL STENT    URETEROSCOPY  10/22/2011   Procedure: URETEROSCOPY;  Surgeon:  Donnice Gwenyth Brooks, MD;  Location: [facility];  Service: Urology;  Laterality: Left;   WISDOM TOOTH EXTRACTION     XI ROBOTIC ASSISTED VENTRAL HERNIA N/A 08/20/2020   Procedure: ROBOTIC INCISIONAL HERNIA REPAIR WITH MESH;  Surgeon: Rubin Calamity, MD;  Location: [facility];  Service: General;  Laterality: N/A;   Patient Active Problem List   Diagnosis Date Noted   Hypercoagulable state due to paroxysmal atrial fibrillation (HCC) 04/29/2022   S/P hernia repair 08/20/2020   Renal mass 04/13/2019   Gross hematuria 04/08/2019   Hyperlipidemia 12/14/2014   CAD (coronary artery disease), native coronary artery    Paroxysmal atrial fibrillation (HCC) 08/13/2014   Long term current use of anticoagulant therapy 08/13/2014   Obesity (BMI 30-39.9) 08/13/2014   Hypertensive heart disease    Sleep apnea    Bladder cancer (HCC)    Prostate cancer (HCC)    Lumbar stenosis with neurogenic claudication 12/03/2013   Upper airway cough syndrome 01/27/2013    PCP: Patrisha Jerrell PARAS, PA-C  REFERRING PROVIDER: Wonda Worth SQUIBB, PA   REFERRING DIAG: R10.9 (ICD-10-CM) - Unspecified abdominal pain  THERAPY DIAG:  No diagnosis found.  Rationale for Evaluation and Treatment: Rehabilitation  ONSET DATE: 2021  SUBJECTIVE:                                                                                                                                                                                           SUBJECTIVE STATEMENT: Feeling good this week, abdomen hasn't bothered him   PAIN:  Are you having pain? No NPRS scale: 0/10  Pain location: bil upper abdomen under rib cage  Pain type: sharp Pain description: intermittent   Aggravating factors: bending forward (upper Rt) Relieving factors: compression over abdomen with painful activities   PRECAUTIONS: Other: history of cancer  RED FLAGS: None   WEIGHT BEARING RESTRICTIONS: No  FALLS:  Has patient fallen in last 6  months? No  OCCUPATION: retired  ACTIVITY LEVEL : walking, stays busy outside   PLOF: Independent  PATIENT GOALS: decrease abdominal pain   PERTINENT HISTORY:  Bladder  cancer, BPH, GERD, prostate cancer, a-fib, bil inguinal hernia repair, lumbar laminectomy/decompression 2015, pulmonary fibrosis Sexual abuse: No  BOWEL MOVEMENT: *Pt states that he normally does not have issues, but due to medication for pulmonary fibrosis he has diarrhea, but feels like he can control  URINATION: WNL  INTERCOURSE:  WNL   OBJECTIVE:  Note: Objective measures were completed at Evaluation unless otherwise noted.  10/12/23 PATIENT SURVEYS:   PFIQ-7: 48  COGNITION: Overall cognitive status: Within functional limits for tasks assessed     SENSATION: Light touch: Appears intact   FUNCTIONAL TESTS:  Squat: bil valgus knee collapse, Rt>Lt Single leg stance:  Rt: pelvic drop  Lt:  pelvic drop  Curl-up test: upper abdominal distortion   GAIT: Assistive device utilized: None Comments: some antalgic Lt steps after initial rise from chair, Lt trendelenburg   POSTURE: rounded shoulders, forward head, decreased lumbar lordosis, increased thoracic kyphosis, and posterior pelvic tilt   LUMBARAROM/PROM:  A/PROM A/PROM  Eval (% available)  Flexion 50  Extension 25, relief  Right lateral flexion 75  Left lateral flexion 75  Right rotation 75  Left rotation 75   (Blank rows = not tested)  PALPATION:   General: lumbar scar tissue restriction; tightness in bil lumbar paraspinals and trigger points in Lt glutes  Pelvic Alignment: posterior pelvic tilt  Abdominal: midline abdominal scar tissue restriction; significant tightness throughout all 4 abdominal quadrants with pain directly inferior to bil rib cage; significant increase in sternocostal angle with decreased abdominal tone in upper abdominals resulting in doming  Diastasis recti abdominals: 2-3 finger widths through midline            TODAY'S TREATMENT:                                                                                                                              DATE:  12/15/23 Manual:  Neuromuscular re-education:  Exercises:  Therapeutic activities:   12/08/23: Tricep pull down 20# x25 with transverse abdominis activation  Palloff 10# x20 each Seated cross body diagonals x20 each 8#  Squats 8# 2x10 Farmer's carry 15# 1000' each side Seated ball squeeze in hands with exhale and transverse abdominis activation 2x10 Red loop lateral stepping 50' x2 each way   11/29/23; Palloff x10 10# each cables Forward bent rows 2x10 10# cables Hooklying pull down med power cord 2x10 Hooklying med power cord unilateal opposite hand/knee 2x10 each Bosu: 2x10 step ups, x10 lateral weight shifts, x10 lateral step ups - min A for balance righting Lat pull down 2x10 (x10 25#, x10 40#)   PATIENT EDUCATION:  Education details: See above Person educated: Patient Education method: Explanation, Demonstration, Tactile cues, Verbal cues, and Handouts Education comprehension: verbalized understanding  HOME EXERCISE PROGRAM: [id number]  ASSESSMENT:  CLINICAL IMPRESSION: Patient is a 88 y.o. male who was seen today for physical therapy  treatment for upper abdominal pain since 2021.  He will continue to benefit from skilled PT  intervention in order to decrease pain, address impairments, and improve quality of life.     OBJECTIVE IMPAIRMENTS: decreased activity tolerance, decreased coordination, decreased endurance, decreased mobility, decreased ROM, decreased strength, increased fascial restrictions, increased muscle spasms, impaired flexibility, impaired tone, improper body mechanics, postural dysfunction, and pain.   ACTIVITY LIMITATIONS: carrying, lifting, bending, squatting, and transfers  PARTICIPATION LIMITATIONS: cleaning, laundry, community activity, and yard work  PERSONAL FACTORS: 3+ comorbidities:  medical history are also affecting patient's functional outcome.   REHAB POTENTIAL: Good  CLINICAL DECISION MAKING: Evolving/moderate complexity  EVALUATION COMPLEXITY: Moderate   GOALS: Goals reviewed with patient? Yes  SHORT TERM GOALS: Target date: 11/09/2023 *all goals updated 11/15/23  Pt will be independent with HEP in order to improve activity tolerance.   Baseline: only walking Goal status: MET  2.  Patient will report 25% improve in abdominal pain in order to increase activity tolerance.   Baseline: 7/10 Goal status: MET  3.  Pt will improve upper abdominal tone in order to decrease sternocostal angle, improve activity tolerance, and decrease pain.   Baseline:  Goal status: MET  4.  Pt will increase all impaired lumbar A/ROM by 25% without pain in order to improve abdominal pain.   Baseline: most decrease in flexion and extension Goal status: MET    LONG TERM GOALS: Target date: 01/04/2024   Pt will be independent with advanced HEP in order to improve activity tolerance.   Baseline:  Goal status: on going  2.  Patient will report 75% improve in abdominal pain in order to increase activity tolerance.   Baseline: 7/10 Goal status: MET  3.  Patient will be able to perform single leg stance with good pelvic stability in order to demonstrate appropriate pelvic floor muscle and transversus abdominus strength and coordination in order to decrease abdominal pain and reduce risk of falls.   Baseline: pelvic drop bil Goal status:on going  4.  Pt will demonstrate appropriate squat form in order to show improved bil functional hip strength and reduce risk of falls and improve pelvic support to decrease pain.  Baseline: bil valgus knee collapse, Rt>Lt Goal status: on going  5.  Pt will decrease PFIQ-7 score to less than 20 in order to demonstrate improved functional ability and activity tolerance.  Baseline: 48 Goal status: on going   PLAN:  PT FREQUENCY:  1-2x/week  PT DURATION: 10 visits    PLANNED INTERVENTIONS: [id number]- PT Re-evaluation, 97110-Therapeutic exercises, 97530- Therapeutic activity, 97112- Neuromuscular re-education, 97535- Self Care, [id number]- Manual therapy, [phone]- Gait training, [phone]- Aquatic Therapy, [phone]- Electrical stimulation (unattended), [phone]- Traction (mechanical), [id number]- Ionotophoresis 4mg /ml Dexamethasone , [id number] (1-2 muscles), [id number] (3+ muscles)- Dry Needling, Patient/Family education, Balance training, Taping, Joint mobilization, Joint manipulation, Spinal manipulation, Spinal mobilization, Scar mobilization, Vestibular training, Cryotherapy, Moist heat, and Biofeedback  PLAN FOR NEXT SESSION: manual techniques to lower and upper abdomen to help improve scar tissue restriction; mobility activities; core and hip strengthening  Josette Mares, PT, DPT11/06/259:34 AM [facility] [address] [place], [place] [address] Phone # [phone] Fax [phone]

## 2023-12-21 ENCOUNTER — Ambulatory Visit: Attending: Physician Assistant | Admitting: Physical Therapy

## 2023-12-21 DIAGNOSIS — M6281 Muscle weakness (generalized): Secondary | ICD-10-CM | POA: Insufficient documentation

## 2023-12-21 DIAGNOSIS — M62838 Other muscle spasm: Secondary | ICD-10-CM | POA: Diagnosis not present

## 2023-12-21 DIAGNOSIS — R293 Abnormal posture: Secondary | ICD-10-CM | POA: Insufficient documentation

## 2023-12-21 NOTE — Therapy (Signed)
 OUTPATIENT PHYSICAL THERAPY Male PELVIC TREATMENT   Patient Name: Charles Wright MRN: [id number] DOB:Dec 11, 1935, 88 y.o., male Today's Date: 12/21/2023  END OF SESSION:  PT End of Session - 12/21/23 0850     Visit Number 10    Date for Recertification  01/04/24    Authorization Type BCBS Medicare    Progress Note Due on Visit 10    PT Start Time 0847    PT Stop Time 0926    PT Time Calculation (min) 39 min    Activity Tolerance Patient tolerated treatment well    Behavior During Therapy [facility] for tasks assessed/performed           Past Medical History:  Diagnosis Date   Acute gout of left ankle    06-14-2015   Bladder cancer (HCC)    BCG's tx's   BPH (benign prostatic hypertrophy)    CAD (coronary artery disease) CARDIOLOGIST-  DR TILLEY   a. NSTEMI 12/2014 -  99% dLAD-2 (not amenable to PCI), 50% dLAD-1, 60% mLAD. Medical therapy was recommended.   Cancer of kidney (HCC)    left   Cough    HARSH NONPRODUCTIVE COUGH 1 AND 1/2 WEEKS   GERD (gastroesophageal reflux disease)    takes  OTC periodically   Gilbert's syndrome    H/O cardiac catheterization    a. Note: Difficult radial access in 12/2014 - recommend femoral approach if cath needed in the future.   History of kidney stones    History of non-ST elevation myocardial infarction (NSTEMI)    12-12-2014   CARDIAC CATH W/ NO INTERVENTION X 2FEW DAYS APART   History of spinal fracture 2002   LUMBAR   Hyperlipidemia    Hypertension    Hypothyroidism    Myocardial infarction (HCC)    OSA on CPAP    SET ON 7 USES SOME NIGHTS   Paroxysmal A-fib [facility])    cardiologist-  dr blanca   Prostate cancer [facility]) 2019   last PSA  2.9  (montiored by urologist  dr nieves) [place] AND FEB 2019 RAD DONE   Shingles    2 weeks ago   Wears glasses    Past Surgical History:  Procedure Laterality Date   ATRIAL FIBRILLATION ABLATION N/A 04/01/2022   Procedure: ATRIAL FIBRILLATION ABLATION;  Surgeon: Nancey Eulas BRAVO, MD;  [facility]:  [facility] INVASIVE CV LAB;  Service: Cardiovascular;  Laterality: N/A;   BACK SURGERY  2014   LOWER l 1, 2 4 AND 5   BILATERAL INGUINAL HERNIA REPAIR     CARDIAC CATHETERIZATION N/A 12/13/2014   Procedure: Left Heart Cath and Coronary Angiography;  Surgeon: Alm LELON Clay, MD;  Location: [facility] INVASIVE CV LAB;  Service: Cardiovascular;  Laterality: N/A;  culprit lesion diat LAD-2  99%, not approachable via PCTA  due to extremely tortuous up stream LAD/  mLAD 60% and dLAD-1 50%, both are at the extremely tortuous segment and not PCI targets/  otherwise normal coronary arteries   CARDIOVERSION N/A 08/09/2022   Procedure: CARDIOVERSION;  Surgeon: Okey Vina GAILS, MD;  Location: [facility] INVASIVE CV LAB;  Service: Cardiovascular;  Laterality: N/A;   COLONOSCOPY WITH PROPOFOL  N/A 01/16/2019   Procedure: COLONOSCOPY WITH PROPOFOL ;  Surgeon: Rosalie Kitchens, MD;  Location: [facility];  Service: Endoscopy;  Laterality: N/A;   CYSTOSCOPY W/ RETROGRADES Left 08/22/2012   Procedure: CYSTOSCOPY WITH RETROGRADE PYELOGRAM;  left kidney washings;  Surgeon: Donnice Gwenyth Nieves, MD;  Location: [facility];  Service: Urology;  Laterality:  Left;   CYSTOSCOPY W/ RETROGRADES N/A 07/08/2015   Procedure: CYSTOSCOPY WITH RETROGRADE PYELOGRAM;  Surgeon: Donnice Brooks, MD;  Location: [facility];  Service: Urology;  Laterality: N/A;   CYSTOSCOPY W/ URETERAL STENT PLACEMENT Left 03/24/2019   Procedure: CYSTOSCOPY WITH RETROGRADE PYELOGRAM/URETERAL STENT PLACEMENT ureteroscopy biopsy of neoplasm;  Surgeon: Brooks Donnice, MD;  Location: [facility];  Service: Urology;  Laterality: Left;   CYSTOSCOPY W/ URETERAL STENT PLACEMENT Left 04/08/2019   Procedure: CYSTOSCOPY cystogram clot evacuation left ureteroscopy clot evacuation left stent exchange liimited transuretheral resectio of prastate and fulguration  bleeders of prostate;  Surgeon: Brooks Donnice, MD;  Location: [facility];  Service: Urology;  Laterality: Left;    CYSTOSCOPY WITH BIOPSY  08/22/2012   Procedure: CYSTOSCOPY WITH BIOPSY;  Surgeon: Donnice Gwenyth Brooks, MD;  Location: [facility];  Service: Urology;;   CYSTOSCOPY WITH BIOPSY N/A 11/08/2014   Procedure: CYSTOSCOPY/BIOPSPY BLADDER INSTILLATION OF MARCAINE  AND PYRIDIUM ;  Surgeon: Donnice Brooks, MD;  Location: [facility];  Service: Urology;  Laterality: N/A;   CYSTOSCOPY WITH BIOPSY N/A 07/08/2015   Procedure: CYSTOSCOPY WITH BLADDER BIOPSY, FULGURATION, RETROGRADE PYLOGRAM AND RENAL WASHING;  Surgeon: Donnice Brooks, MD;  Location: [facility];  Service: Urology;  Laterality: N/A;   CYSTOSCOPY WITH RETROGRADE PYELOGRAM, URETEROSCOPY AND STENT PLACEMENT Bilateral 07/30/2014   Procedure: CYSTOSCOPY WITH BLADDER BX FULERGATION AND BILATERAL RETROGRADE PYELOGRAM,;  Surgeon: Donnice Brooks, MD;  [facility];  Service: Urology;  Laterality: Bilateral;   CYSTOSCOPY WITH STENT PLACEMENT Left 03/24/2018   Procedure: STENT PLACEMENT AND BIOPSY;  Surgeon: Brooks Donnice, MD;  Location: [facility];  Service: Urology;  Laterality: Left;   CYSTOSCOPY/RETROGRADE/URETEROSCOPY Bilateral 08/17/2013   Procedure: CYSTOSCOPY, BLADDER BIOPSY WITH BILATERAL RETROGRADE PYELOGRAM, LEFT URETEROSCOPY AND DILATION OF STRICTURE ;  Surgeon: Donnice Brooks, MD;  Location: [facility];  Service: Urology;  Laterality: Bilateral;   CYSTOSCOPY/RETROGRADE/URETEROSCOPY Left 03/24/2018   Procedure: CYSTOSCOPY/RETROGRADE/URETEROSCOPY;  Surgeon: Brooks Donnice, MD;  Location: [facility];  Service: Urology;  Laterality: Left;   HOT HEMOSTASIS N/A 01/16/2019   Procedure: HOT HEMOSTASIS (ARGON PLASMA COAGULATION/BICAP);  Surgeon: Rosalie Kitchens, MD;  Location: [facility];  Service: Endoscopy;  Laterality: N/A;   INSERTION OF MESH N/A 08/20/2020   Procedure: INSERTION OF MESH;  Surgeon: Rubin Calamity, MD;  Location: [facility] OR;  Service:  General;  Laterality: N/A;   LEFT ATRIAL APPENDAGE OCCLUSION N/A 01/09/2015   Procedure: LEFT ATRIAL APPENDAGE OCCLUSION;  Surgeon: Lynwood Rakers, MD;  [facility];  Service: Cardiovascular;  Laterality: N/A;   LEFT HEART CATH AND CORONARY ANGIOGRAPHY N/A 11/20/2021   Procedure: LEFT HEART CATH AND CORONARY ANGIOGRAPHY;  Surgeon: Wonda Sharper, MD;  Location[facility] INVASIVE CV LAB;  Service: Cardiovascular;  Laterality: N/A;   LUMBAR LAMINECTOMY/DECOMPRESSION MICRODISCECTOMY Left 12/03/2013   Procedure: Left Lumbar three-four diskectomy with Lumbar four laminectomy ;  Surgeon: Reyes Budge, MD;  [facility];  Service: Neurosurgery;  Laterality: Left;  Left Lumbar three-four diskectomy with Lumbar four laminectomy    LYMPH NODE DISSECTION Bilateral 04/13/2019   Procedure: LYMPH NODE DISSECTION;  Surgeon: Alvaro Hummer, MD;  Location: [facility];  Service: Urology;  Laterality: Bilateral;   ORIF LEFT ANKLE FX  2005   POLYPECTOMY  01/16/2019   Procedure: POLYPECTOMY;  Surgeon: Rosalie Kitchens, MD;  [facility];  Service: Endoscopy;;   PROSTATE BIOPSY N/A 08/22/2012   Procedure: BIOPSY TRANSRECTAL ULTRASONIC PROSTATE (TUBP);  Surgeon: Donnice Gwenyth  Nieves, MD;  Location[facility];  Service: Urology;  Laterality: N/A;   ROBOT ASSITED LAPAROSCOPIC NEPHROURETERECTOMY Left 04/13/2019   Procedure: XI ROBOT ASSITED LAPAROSCOPIC NEPHROURETERECTOMY;  Surgeon: Alvaro Hummer, MD;  Location: [facility];  Service: Urology;  Laterality: Left;  3 HRS   TEE WITHOUT CARDIOVERSION N/A 12/31/2014   Procedure: TRANSESOPHAGEAL ECHOCARDIOGRAM (TEE);  Surgeon: Vinie JAYSON Maxcy, MD;  Location: [facility] ENDOSCOPY;  Service: Cardiovascular;  Laterality: N/A;  ef 55-60%/  mild MR, TR, and PR/  mild LAE and RAE   TEE WITHOUT CARDIOVERSION N/A 04/01/2022   Procedure: TRANSESOPHAGEAL ECHOCARDIOGRAM;  Surgeon: Nancey Eulas BRAVO, MD;  [facility];  Service: Cardiovascular;   Laterality: N/A;   TRANSURETHRAL RESECTION OF BLADDER TUMOR  10/22/2011   Procedure: TRANSURETHRAL RESECTION OF BLADDER TUMOR (TURBT);  Surgeon: Donnice Gwenyth Nieves, MD;  Location: [facility];  Service: Urology;  Laterality: N/A;  TURBT, LEFT URETEROSCOPY, POSSIBLE URETERAL STENT    URETEROSCOPY  10/22/2011   Procedure: URETEROSCOPY;  Surgeon: Donnice Gwenyth Nieves, MD;  Location: [facility];  Service: Urology;  Laterality: Left;   WISDOM TOOTH EXTRACTION     XI ROBOTIC ASSISTED VENTRAL HERNIA N/A 08/20/2020   Procedure: ROBOTIC INCISIONAL HERNIA REPAIR WITH MESH;  Surgeon: Rubin Calamity, MD;  Location: [facility] OR;  Service: General;  Laterality: N/A;   Patient Active Problem List   Diagnosis Date Noted   Hypercoagulable state due to paroxysmal atrial fibrillation (HCC) 04/29/2022   S/P hernia repair 08/20/2020   Renal mass 04/13/2019   Gross hematuria 04/08/2019   Hyperlipidemia 12/14/2014   CAD (coronary artery disease), native coronary artery    Paroxysmal atrial fibrillation (HCC) 08/13/2014   Long term current use of anticoagulant therapy 08/13/2014   Obesity (BMI 30-39.9) 08/13/2014   Hypertensive heart disease    Sleep apnea    Bladder cancer (HCC)    Prostate cancer (HCC)    Lumbar stenosis with neurogenic claudication 12/03/2013   Upper airway cough syndrome 01/27/2013    PCP: Patrisha Jerrell PARAS, PA-C  REFERRING PROVIDER: Wonda Worth SQUIBB, PA   REFERRING DIAG: R10.9 (ICD-10-CM) - Unspecified abdominal pain  THERAPY DIAG:  Abnormal posture  Muscle weakness (generalized)  Other muscle spasm  Rationale for Evaluation and Treatment: Rehabilitation  ONSET DATE: 2021  SUBJECTIVE:                                                                                                                                                                                           SUBJECTIVE STATEMENT: I feel much much better, the abdominal pain has  pretty much resolved. No  issues now. The pain is completely gone.     PAIN:  Are you having pain? No NPRS scale: 0/10  Pain location: bil upper abdomen under rib cage  Pain type: sharp Pain description: intermittent   Aggravating factors: bending forward (upper Rt) Relieving factors: compression over abdomen with painful activities   PRECAUTIONS: Other: history of cancer  RED FLAGS: None   WEIGHT BEARING RESTRICTIONS: No  FALLS:  Has patient fallen in last 6 months? No  OCCUPATION: retired  ACTIVITY LEVEL : walking, stays busy outside   PLOF: Independent  PATIENT GOALS: decrease abdominal pain   PERTINENT HISTORY:  Bladder cancer, BPH, GERD, prostate cancer, a-fib, bil inguinal hernia repair, lumbar laminectomy/decompression 2015, pulmonary fibrosis Sexual abuse: No  BOWEL MOVEMENT: *Pt states that he normally does not have issues, but due to medication for pulmonary fibrosis he has diarrhea, but feels like he can control  URINATION: WNL  INTERCOURSE:  WNL   OBJECTIVE:  Note: Objective measures were completed at Evaluation unless otherwise noted.  10/12/23 PATIENT SURVEYS:   PFIQ-7: 48  COGNITION: Overall cognitive status: Within functional limits for tasks assessed     SENSATION: Light touch: Appears intact   FUNCTIONAL TESTS:  Squat: bil valgus knee collapse, Rt>Lt Single leg stance:  Rt: pelvic drop  Lt:  pelvic drop  Curl-up test: upper abdominal distortion   GAIT: Assistive device utilized: None Comments: some antalgic Lt steps after initial rise from chair, Lt trendelenburg   POSTURE: rounded shoulders, forward head, decreased lumbar lordosis, increased thoracic kyphosis, and posterior pelvic tilt   LUMBARAROM/PROM:  A/PROM A/PROM  Eval (% available)  Flexion 50  Extension 25, relief  Right lateral flexion 75  Left lateral flexion 75  Right rotation 75  Left rotation 75   (Blank rows = not tested)  PALPATION:   General:  lumbar scar tissue restriction; tightness in bil lumbar paraspinals and trigger points in Lt glutes  Pelvic Alignment: posterior pelvic tilt  Abdominal: midline abdominal scar tissue restriction; significant tightness throughout all 4 abdominal quadrants with pain directly inferior to bil rib cage; significant increase in sternocostal angle with decreased abdominal tone in upper abdominals resulting in doming  Diastasis recti abdominals: 2-3 finger widths through midline           TODAY'S TREATMENT:                                                                                                                              DATE:   12/21/23: Reviewed goals reassessed squat and single leg stance - goals met Lat pull down 40# x20 Lateral stepping x3 each way red loop 50'  Palloffs 10# rt 5# lt x20 10# squats 2x10 with chair for depth feedback Seated ball squeeze in hands with exhale and transverse abdominis activation 2x10 Farmer's carry 1000' each 20# one cue for posture  12/08/23: Tricep pull down 20# x25 with transverse abdominis activation  Palloff 10# x20 each  Seated cross body diagonals x20 each 8#  Squats 8# 2x10 Farmer's carry 15# 1000' each side Seated ball squeeze in hands with exhale and transverse abdominis activation 2x10 Red loop lateral stepping 50' x2 each way   11/29/23; Palloff x10 10# each cables Forward bent rows 2x10 10# cables Hooklying pull down med power cord 2x10 Hooklying med power cord unilateal opposite hand/knee 2x10 each Bosu: 2x10 step ups, x10 lateral weight shifts, x10 lateral step ups - min A for balance righting Lat pull down 2x10 (x10 25#, x10 40#)   11/23/23: Manual scar tissue mobility pt in supine with diaphragmatic breathing throughout cued, fascial release completed in all quadrants no pain and moderate release completed. Rt side most restricted today so most of manual completed here X10 blue band diagonals standing with transverse  abdominis activation  Blue band bil shoulder horizontal abduction 2x10 standing with transverse abdominis activation   Blue band palloffs 2x10 each Rotational palloffs 2x10 blue band 2x10 each Anti rotations 10# standing with cross body high/low 2x10 each 7# going to Lt upper due to chronic shoulder limitations Cross body 7# with opposite hip flexion x10 15# farmers carry 1000' each hand  PATIENT EDUCATION:  Education details: See above Person educated: Patient Education method: Explanation, Demonstration, Tactile cues, Verbal cues, and Handouts Education comprehension: verbalized understanding  HOME EXERCISE PROGRAM: [id number]  ASSESSMENT:  CLINICAL IMPRESSION: Patient is a 88 y.o. male who was seen today for physical therapy  treatment for upper abdominal pain since 2021. Pt reports he is feeling much better overall abdomen is causing him problems or pain anymore. Does have shoulder pain and neck pain and plans to ask his doctor about PT for his in the future. Pt has met all goals and pleased with progress, confident with DC today.      OBJECTIVE IMPAIRMENTS: decreased activity tolerance, decreased coordination, decreased endurance, decreased mobility, decreased ROM, decreased strength, increased fascial restrictions, increased muscle spasms, impaired flexibility, impaired tone, improper body mechanics, postural dysfunction, and pain.   ACTIVITY LIMITATIONS: carrying, lifting, bending, squatting, and transfers  PARTICIPATION LIMITATIONS: cleaning, laundry, community activity, and yard work  PERSONAL FACTORS: 3+ comorbidities: medical history are also affecting patient's functional outcome.   REHAB POTENTIAL: Good  CLINICAL DECISION MAKING: Evolving/moderate complexity  EVALUATION COMPLEXITY: Moderate   GOALS: Goals reviewed with patient? Yes  SHORT TERM GOALS: Target date: 11/09/2023 *all goals updated 11/15/23  Pt will be independent with HEP in order to improve activity  tolerance.   Baseline: only walking Goal status: MET  2.  Patient will report 25% improve in abdominal pain in order to increase activity tolerance.   Baseline: 7/10 Goal status: MET  3.  Pt will improve upper abdominal tone in order to decrease sternocostal angle, improve activity tolerance, and decrease pain.   Baseline:  Goal status: MET  4.  Pt will increase all impaired lumbar A/ROM by 25% without pain in order to improve abdominal pain.   Baseline: most decrease in flexion and extension Goal status: MET    LONG TERM GOALS: Target date: 01/04/2024 12/21/23 goals updated   Pt will be independent with advanced HEP in order to improve activity tolerance.   Baseline:  Goal status: MET  2.  Patient will report 75% improve in abdominal pain in order to increase activity tolerance.   Baseline: 7/10 Goal status: MET  3.  Patient will be able to perform single leg stance with good pelvic stability in order to demonstrate appropriate pelvic floor muscle and  transversus abdominus strength and coordination in order to decrease abdominal pain and reduce risk of falls.   Baseline: pelvic drop bil Goal status:MET  4.  Pt will demonstrate appropriate squat form in order to show improved bil functional hip strength and reduce risk of falls and improve pelvic support to decrease pain.  Baseline: bil valgus knee collapse, Rt>Lt Goal status: MET  5.  Pt will decrease PFIQ-7 score to less than 20 in order to demonstrate improved functional ability and activity tolerance.  Baseline: 48 Goal status: MET   PLAN:  PT FREQUENCY: 1-2x/week  PT DURATION: 10 visits    PLANNED INTERVENTIONS: [id number]- PT Re-evaluation, 97110-Therapeutic exercises, 97530- Therapeutic activity, 97112- Neuromuscular re-education, 97535- Self Care, 02859- Manual therapy, [phone]- Gait training, [phone]- Aquatic Therapy, [phone]- Electrical stimulation (unattended), [phone]- Traction (mechanical), [id number]- Ionotophoresis  4mg /ml Dexamethasone , 79439 (1-2 muscles), [id number] (3+ muscles)- Dry Needling, Patient/Family education, Balance training, Taping, Joint mobilization, Joint manipulation, Spinal manipulation, Spinal mobilization, Scar mobilization, Vestibular training, Cryotherapy, Moist heat, and Biofeedback  PLAN FOR NEXT SESSION:  PHYSICAL THERAPY DISCHARGE SUMMARY  Visits from Start of Care: 10  Current functional level related to goals / functional outcomes: All goals met   Remaining deficits: None for pt    Education / Equipment: HEP   Patient agrees to discharge. Patient goals were met. Patient is being discharged due to meeting the stated rehab goals.   Darryle Navy, PT, DPT 11/12/259:34 AM  [facility] [address] [place], [place] [address] Phone # [phone] Fax [phone]

## 2023-12-27 DIAGNOSIS — M542 Cervicalgia: Secondary | ICD-10-CM | POA: Diagnosis not present

## 2023-12-27 DIAGNOSIS — M5030 Other cervical disc degeneration, unspecified cervical region: Secondary | ICD-10-CM | POA: Diagnosis not present

## 2023-12-27 DIAGNOSIS — M5412 Radiculopathy, cervical region: Secondary | ICD-10-CM | POA: Diagnosis not present

## 2023-12-27 DIAGNOSIS — M25511 Pain in right shoulder: Secondary | ICD-10-CM | POA: Diagnosis not present

## 2024-01-11 DIAGNOSIS — J849 Interstitial pulmonary disease, unspecified: Secondary | ICD-10-CM | POA: Diagnosis not present

## 2024-01-17 DIAGNOSIS — M542 Cervicalgia: Secondary | ICD-10-CM | POA: Diagnosis not present

## 2024-01-17 DIAGNOSIS — M25511 Pain in right shoulder: Secondary | ICD-10-CM | POA: Diagnosis not present

## 2024-01-17 DIAGNOSIS — M5412 Radiculopathy, cervical region: Secondary | ICD-10-CM | POA: Diagnosis not present

## 2024-01-17 DIAGNOSIS — M5030 Other cervical disc degeneration, unspecified cervical region: Secondary | ICD-10-CM | POA: Diagnosis not present

## 2024-01-20 DIAGNOSIS — M25512 Pain in left shoulder: Secondary | ICD-10-CM | POA: Diagnosis not present

## 2024-01-20 DIAGNOSIS — M542 Cervicalgia: Secondary | ICD-10-CM | POA: Diagnosis not present

## 2024-02-10 ENCOUNTER — Other Ambulatory Visit: Payer: Self-pay | Admitting: Internal Medicine

## 2024-02-10 MED ORDER — APIXABAN 5 MG PO TABS: 5.0000 mg | ORAL_TABLET | Freq: Two times a day (BID) | ORAL | 1 refills | Status: AC

## 2024-02-10 NOTE — Telephone Encounter (Signed)
 Prescription refill request for Eliquis  received. Indication: a fib Last office visit: 08/01/23 Scr:  1.18 epic 103/25 Age: 89 Weight: 83kg

## 2024-02-16 ENCOUNTER — Telehealth: Payer: Self-pay

## 2024-02-16 DIAGNOSIS — J849 Interstitial pulmonary disease, unspecified: Secondary | ICD-10-CM

## 2024-02-16 NOTE — Telephone Encounter (Signed)
 Copied from CRM #[id number]. Topic: Clinical - Lab/Test Results >> Feb 16, 2024 10:51 AM Rozanna MATSU wrote: Reason for CRM: pt called to schedule his labs but it will not allow me to schedule stated the orders are not in but it's showing on the active tab they are in. Pt would like like someone to contact him once this is corrected so he can schedule the labs   Lab appt scheduled & new order placed. NFN

## 2024-02-22 ENCOUNTER — Other Ambulatory Visit

## 2024-02-24 ENCOUNTER — Other Ambulatory Visit

## 2024-02-27 ENCOUNTER — Other Ambulatory Visit

## 2024-02-27 DIAGNOSIS — J849 Interstitial pulmonary disease, unspecified: Secondary | ICD-10-CM | POA: Diagnosis not present

## 2024-02-27 LAB — COMPREHENSIVE METABOLIC PANEL WITH GFR
ALT: 20 U/L (ref 3–53)
AST: 27 U/L (ref 5–37)
Albumin: 4 g/dL (ref 3.5–5.2)
Alkaline Phosphatase: 75 U/L (ref 39–117)
BUN: 24 mg/dL — ABNORMAL HIGH (ref 6–23)
CO2: 29 meq/L (ref 19–32)
Calcium: 9.5 mg/dL (ref 8.4–10.5)
Chloride: 105 meq/L (ref 96–112)
Creatinine, Ser: 1.3 mg/dL (ref 0.40–1.50)
GFR: 49.6 mL/min — ABNORMAL LOW
Glucose, Bld: 101 mg/dL — ABNORMAL HIGH (ref 70–99)
Potassium: 4.4 meq/L (ref 3.5–5.1)
Sodium: 140 meq/L (ref 135–145)
Total Bilirubin: 1.8 mg/dL — ABNORMAL HIGH (ref 0.2–1.2)
Total Protein: 6.5 g/dL (ref 6.0–8.3)

## 2024-03-01 ENCOUNTER — Ambulatory Visit (HOSPITAL_BASED_OUTPATIENT_CLINIC_OR_DEPARTMENT_OTHER)

## 2024-03-01 DIAGNOSIS — Z5181 Encounter for therapeutic drug level monitoring: Secondary | ICD-10-CM | POA: Diagnosis not present

## 2024-03-01 LAB — PULMONARY FUNCTION TEST
DL/VA % pred: 85 %
DL/VA: 3.29 ml/min/mmHg/L
DLCO unc % pred: 71 %
DLCO unc: 15.56 ml/min/mmHg
FEF 25-75 Post: 3.08 L/s
FEF 25-75 Pre: 2.73 L/s
FEF2575-%Change-Post: 12 %
FEF2575-%Pred-Post: 213 %
FEF2575-%Pred-Pre: 188 %
FEV1-%Change-Post: 3 %
FEV1-%Pred-Post: 118 %
FEV1-%Pred-Pre: 113 %
FEV1-Post: 2.72 L
FEV1-Pre: 2.62 L
FEV1FVC-%Change-Post: 0 %
FEV1FVC-%Pred-Pre: 115 %
FEV6-%Change-Post: 3 %
FEV6-%Pred-Post: 107 %
FEV6-%Pred-Pre: 104 %
FEV6-Post: 3.32 L
FEV6-Pre: 3.22 L
FEV6FVC-%Change-Post: 0 %
FEV6FVC-%Pred-Post: 108 %
FEV6FVC-%Pred-Pre: 108 %
FVC-%Change-Post: 3 %
FVC-%Pred-Post: 99 %
FVC-%Pred-Pre: 96 %
FVC-Post: 3.33 L
FVC-Pre: 3.22 L
Post FEV1/FVC ratio: 82 %
Post FEV6/FVC ratio: 100 %
Pre FEV1/FVC ratio: 81 %
Pre FEV6/FVC Ratio: 100 %
RV % pred: 102 %
RV: 2.72 L
TLC % pred: 92 %
TLC: 6.11 L

## 2024-03-01 NOTE — Progress Notes (Signed)
 Full PFT performed today.

## 2024-03-01 NOTE — Patient Instructions (Signed)
 Full PFT performed today.

## 2024-03-08 ENCOUNTER — Ambulatory Visit: Admitting: Pulmonary Disease

## 2024-03-09 ENCOUNTER — Ambulatory Visit: Admitting: Pulmonary Disease

## 2024-04-02 ENCOUNTER — Ambulatory Visit: Admitting: Pulmonary Disease

## 2024-04-05 ENCOUNTER — Ambulatory Visit: Admitting: Pulmonary Disease
